# Patient Record
Sex: Male | Born: 1950
Health system: Southern US, Community
[De-identification: ages and names within clinical notes are randomized; demographics above are authoritative.]

## PROBLEM LIST (undated history)

## (undated) DIAGNOSIS — R0600 Dyspnea, unspecified: Secondary | ICD-10-CM

## (undated) DIAGNOSIS — J189 Pneumonia, unspecified organism: Secondary | ICD-10-CM

## (undated) DIAGNOSIS — E785 Hyperlipidemia, unspecified: Secondary | ICD-10-CM

## (undated) DIAGNOSIS — K5792 Diverticulitis of intestine, part unspecified, without perforation or abscess without bleeding: Secondary | ICD-10-CM

## (undated) DIAGNOSIS — M797 Fibromyalgia: Secondary | ICD-10-CM

## (undated) DIAGNOSIS — M1990 Unspecified osteoarthritis, unspecified site: Secondary | ICD-10-CM

## (undated) DIAGNOSIS — F329 Major depressive disorder, single episode, unspecified: Secondary | ICD-10-CM

## (undated) DIAGNOSIS — E119 Type 2 diabetes mellitus without complications: Secondary | ICD-10-CM

## (undated) DIAGNOSIS — G4730 Sleep apnea, unspecified: Secondary | ICD-10-CM

## (undated) DIAGNOSIS — K52832 Lymphocytic colitis: Secondary | ICD-10-CM

## (undated) DIAGNOSIS — F32A Depression, unspecified: Secondary | ICD-10-CM

## (undated) DIAGNOSIS — T7840XA Allergy, unspecified, initial encounter: Secondary | ICD-10-CM

## (undated) DIAGNOSIS — K219 Gastro-esophageal reflux disease without esophagitis: Secondary | ICD-10-CM

## (undated) DIAGNOSIS — R1084 Generalized abdominal pain: Secondary | ICD-10-CM

## (undated) DIAGNOSIS — I10 Essential (primary) hypertension: Secondary | ICD-10-CM

## (undated) DIAGNOSIS — J449 Chronic obstructive pulmonary disease, unspecified: Secondary | ICD-10-CM

## (undated) DIAGNOSIS — J45909 Unspecified asthma, uncomplicated: Secondary | ICD-10-CM

## (undated) DIAGNOSIS — C801 Malignant (primary) neoplasm, unspecified: Secondary | ICD-10-CM

## (undated) HISTORY — PX: ROTATOR CUFF REPAIR: SHX139

## (undated) HISTORY — PX: COLONOSCOPY W/ POLYPECTOMY: SHX1380

## (undated) HISTORY — DX: Allergy, unspecified, initial encounter: T78.40XA

## (undated) HISTORY — DX: Depression, unspecified: F32.A

## (undated) HISTORY — DX: Major depressive disorder, single episode, unspecified: F32.9

## (undated) HISTORY — DX: Generalized abdominal pain: R10.84

## (undated) HISTORY — DX: Essential (primary) hypertension: I10

## (undated) HISTORY — PX: OTHER SURGICAL HISTORY: SHX169

## (undated) HISTORY — DX: Lymphocytic colitis: K52.832

---

## 1998-09-16 ENCOUNTER — Ambulatory Visit (HOSPITAL_BASED_OUTPATIENT_CLINIC_OR_DEPARTMENT_OTHER): Admission: RE | Admit: 1998-09-16 | Discharge: 1998-09-16 | Payer: Self-pay | Admitting: Orthopaedic Surgery

## 2005-10-13 ENCOUNTER — Ambulatory Visit (HOSPITAL_COMMUNITY): Payer: Self-pay | Admitting: Psychiatry

## 2005-11-12 ENCOUNTER — Ambulatory Visit (HOSPITAL_COMMUNITY): Payer: Self-pay | Admitting: Psychiatry

## 2005-12-22 ENCOUNTER — Ambulatory Visit (HOSPITAL_COMMUNITY): Payer: Self-pay | Admitting: Psychiatry

## 2006-03-02 ENCOUNTER — Ambulatory Visit (HOSPITAL_COMMUNITY): Payer: Self-pay | Admitting: Psychiatry

## 2006-03-23 ENCOUNTER — Ambulatory Visit: Payer: Self-pay | Admitting: Internal Medicine

## 2006-05-19 ENCOUNTER — Ambulatory Visit: Payer: Self-pay | Admitting: Internal Medicine

## 2006-05-20 ENCOUNTER — Ambulatory Visit: Payer: Self-pay | Admitting: Internal Medicine

## 2006-05-30 ENCOUNTER — Ambulatory Visit: Payer: Self-pay | Admitting: Internal Medicine

## 2006-05-31 ENCOUNTER — Ambulatory Visit (HOSPITAL_COMMUNITY): Payer: Self-pay | Admitting: Psychiatry

## 2006-06-13 ENCOUNTER — Ambulatory Visit: Payer: Self-pay | Admitting: Internal Medicine

## 2006-06-30 ENCOUNTER — Ambulatory Visit (HOSPITAL_BASED_OUTPATIENT_CLINIC_OR_DEPARTMENT_OTHER): Admission: RE | Admit: 2006-06-30 | Discharge: 2006-06-30 | Payer: Self-pay | Admitting: Internal Medicine

## 2006-06-30 ENCOUNTER — Encounter: Payer: Self-pay | Admitting: Internal Medicine

## 2006-07-14 ENCOUNTER — Ambulatory Visit: Payer: Self-pay | Admitting: Pulmonary Disease

## 2006-07-26 ENCOUNTER — Ambulatory Visit: Payer: Self-pay | Admitting: Internal Medicine

## 2006-08-12 ENCOUNTER — Ambulatory Visit: Payer: Self-pay | Admitting: Pulmonary Disease

## 2006-08-31 ENCOUNTER — Ambulatory Visit (HOSPITAL_COMMUNITY): Payer: Self-pay | Admitting: Psychiatry

## 2006-09-13 ENCOUNTER — Ambulatory Visit: Payer: Self-pay | Admitting: Pulmonary Disease

## 2006-12-28 ENCOUNTER — Ambulatory Visit (HOSPITAL_COMMUNITY): Payer: Self-pay | Admitting: Psychiatry

## 2007-01-24 ENCOUNTER — Ambulatory Visit (HOSPITAL_COMMUNITY): Payer: Self-pay | Admitting: Psychiatry

## 2007-03-10 DIAGNOSIS — G4733 Obstructive sleep apnea (adult) (pediatric): Secondary | ICD-10-CM

## 2007-03-10 DIAGNOSIS — J42 Unspecified chronic bronchitis: Secondary | ICD-10-CM

## 2007-03-10 DIAGNOSIS — F17200 Nicotine dependence, unspecified, uncomplicated: Secondary | ICD-10-CM

## 2007-03-10 DIAGNOSIS — J45909 Unspecified asthma, uncomplicated: Secondary | ICD-10-CM | POA: Insufficient documentation

## 2007-03-10 HISTORY — DX: Unspecified chronic bronchitis: J42

## 2007-03-10 HISTORY — DX: Obstructive sleep apnea (adult) (pediatric): G47.33

## 2007-03-10 HISTORY — DX: Nicotine dependence, unspecified, uncomplicated: F17.200

## 2007-03-14 ENCOUNTER — Ambulatory Visit (HOSPITAL_COMMUNITY): Payer: Self-pay | Admitting: Psychiatry

## 2007-06-06 ENCOUNTER — Ambulatory Visit (HOSPITAL_COMMUNITY): Payer: Self-pay | Admitting: Psychiatry

## 2007-08-08 ENCOUNTER — Ambulatory Visit (HOSPITAL_COMMUNITY): Payer: Self-pay | Admitting: Psychiatry

## 2007-10-11 ENCOUNTER — Ambulatory Visit (HOSPITAL_COMMUNITY): Payer: Self-pay | Admitting: Psychiatry

## 2007-12-27 ENCOUNTER — Ambulatory Visit (HOSPITAL_COMMUNITY): Payer: Self-pay | Admitting: Psychiatry

## 2008-09-06 ENCOUNTER — Telehealth: Payer: Self-pay | Admitting: Pulmonary Disease

## 2009-05-06 ENCOUNTER — Ambulatory Visit (HOSPITAL_COMMUNITY): Payer: Self-pay | Admitting: Psychiatry

## 2009-06-24 ENCOUNTER — Ambulatory Visit (HOSPITAL_COMMUNITY): Payer: Self-pay | Admitting: Psychiatry

## 2009-08-26 ENCOUNTER — Ambulatory Visit (HOSPITAL_COMMUNITY): Payer: Self-pay | Admitting: Psychiatry

## 2009-12-02 ENCOUNTER — Ambulatory Visit (HOSPITAL_COMMUNITY): Payer: Self-pay | Admitting: Psychiatry

## 2010-02-06 ENCOUNTER — Ambulatory Visit (HOSPITAL_COMMUNITY): Payer: Self-pay | Admitting: Psychiatry

## 2010-04-03 ENCOUNTER — Ambulatory Visit (HOSPITAL_COMMUNITY)
Admission: RE | Admit: 2010-04-03 | Discharge: 2010-04-03 | Payer: Self-pay | Source: Home / Self Care | Attending: Psychiatry | Admitting: Psychiatry

## 2010-07-06 ENCOUNTER — Encounter (INDEPENDENT_AMBULATORY_CARE_PROVIDER_SITE_OTHER): Payer: 59 | Admitting: Psychiatry

## 2010-07-06 DIAGNOSIS — F339 Major depressive disorder, recurrent, unspecified: Secondary | ICD-10-CM

## 2010-08-04 NOTE — Assessment & Plan Note (Signed)
[facility]                             PULMONARY OFFICE NOTE   DAREAN, ROTE                  MRN:          [id number]  DATE:09/13/2006                            DOB:          06/01/1950    I saw Mr. Balkcom in followup today for his mild sleep apnea,  moderate asthma with chronic bronchitis and tobacco abuse.   With regards to his sleep apnea, he has been using his auto CPAP for the  last approximately 4 weeks.  He says that since being started on CPAP  this is the first time he can remember where he did not actually have to  wake up several times a night to use the bathroom.  He is no longer  snoring at night and he feels like he is getting better rest at night.  He also says he feels like he has more energy during the day,  particularly in the morning.  He is currently using a full-face mask  with heated humidification.  He does have some nasal congestion but this  was present prior to his use of CPAP and does not appear to have gotten  any worse.  Otherwise, he is not having any difficulty with the use of  his CPAP.   With his asthma and chronic bronchitis he feels that his breathing is  reasonably stable with the use of his Symbicort and Spiriva.  He says  that he does not have much difficulties with regards to dyspnea on  exertion.  He does have occasional cough with a production of clear  sputum.  He does also occasionally get some wheezing.   He is continuing to smoke about a pack of cigarettes per day.  He says  that he has had quite a bit of activity going on as he is building a new  house and as a result it has been difficult for him to focus on smoking  cessation.  He plans on completing his house construction by December of  this year and has stated that he would try to make a New Years  resolution to stop smoking.  His medication list was reviewed.   PHYSICAL EXAM:  He is 225 pounds, temperature is 98.5, blood  pressure is  112/72, heart rate is 89, oxygen saturation is 94% on room air.  HEENT:  There is no sinus tenderness, no nasal discharge, no oral  lesions, no lymphadenopathy.  HEART:  S1, S2, regular rhythm.  CHEST:  He has fine bilateral wheezing which partially cleared after  coughing.  ABDOMEN:  Soft, nontender.  EXTREMITIES:  No edema.   IMPRESSION:  1. Mild obstructive sleep apnea.  He is currently doing quite well on      his auto CPAP.  He is due to drop off his CPAP download to his home      care company and I will review this.  Otherwise, I have encouraged      him to maintain his compliance with his CPAP machine.  2. Moderate asthma with chronic bronchitis.  I will continue him on  his current inhaler regimen of Symbicort 160/4.5  two puffs twice      daily as well as Spiriva one puff daily in addition to using ProAir      HFA two puffs four times daily as needed.  3. Tobacco abuse.  I have again encouraged him to continue through      with his plans for smoking cessation.   I will follow up with him in approximately 4 months.     Coralyn Helling, MD  Electronically Signed    VS/MedQ  DD: 09/13/2006  DT: 09/13/2006  Job #: [id number]   cc:   Dalbert Mayotte, M.D.

## 2010-08-04 NOTE — Assessment & Plan Note (Signed)
[facility]                             PULMONARY OFFICE NOTE   Frederick Guzman, Frederick Guzman                  MRN:          [id number]  DATE:08/12/2006                            DOB:          10-Apr-1950    I met Frederick Guzman with his wife today for evaluation of his sleep  apnea.  He had undergone an overnight polysomnogram, June 30, 2006, and  this showed an overall apnea hypopnea index of 9 with oxygen saturation  nadir of 81%.  He did have a significant REM effect with a REM apnea  hypopnea index of 47.  Also of note is that he did not have much supine  sleep.   He says that his current sleep pattern is that he goes to bed at around  11 o'clock at night.  He has no trouble falling asleep.  He wakes up  once or twice during the night to use the bathroom, but then is able to  fall back asleep after that.  He gets up at around 7 o'clock in the  morning.  He says that he feels reasonably okay when he wakes up, but  apparently has quite a bit of difficulty staying awake during the rest  of the day.  His wife says that he can fall asleep at any time of the  day without problems.  He, however, denies any problems of feeling  sleepy while driving.  He does snore while he is asleep and tends to  breathe through his mouth at night.  He has also been told by his  dentist that he has bruxism.  He will occasionally get vivid dreams and  does talk in his sleep, but does not sleepwalk.  There is no history of  sleep hallucinations, sleep paralysis or cataplexy.  There is no history  of restless leg syndrome.  He is not currently using anything to help  him fall asleep at night or stay awake during the day.  His Epworth  score is 6.   He says that, with regards to his asthma, he still has symptoms of  coughing, wheezing and chest tightness.  He says that he has not noticed  much of a difference since having the dose of his Nexium increased or  with the  addition of the Reglan.  Additionally, he says he has not used  his Ultram for the last several months because he did not really notice  much benefit from it.   He does continue to smoke about one and a half packs of cigarettes per  day.  He apparently had his dose of bupropion increased with the hope  that this would assist with his smoking cessation.   PAST MEDICAL HISTORY:  Significant for asthma, reflux disease, carpal  tunnel syndrome, BPH, elevated cholesterol, and bipolar disease.   CURRENT MEDICATIONS:  1. Flomax 0.4 mg daily.  2. Nexium 40 mg b.i.d.  3. Lipitor 10 mg q.h.s.  4. Bupropion XL 300 mg daily.  5. Depakote ER 1000 mg q.h.s.  6. Symbicort 160/4.5 two puffs b.i.d.  7. AndroGel 5 g b.i.d.  8.  Metoclopramide 10 mg b.i.d.  9. Mucinex as needed.  10.Tramadol as needed.  11.Albuterol as needed.  12.Saline nasal spray as needed.  13.Zyrtec as needed.   ALLERGIES:  He has no known drug allergies.   FAMILY HISTORY:  Significant for his father, who had diabetes, coronary  artery disease and colon cancer.  His mother had diabetes.   SOCIAL HISTORY:  He is married.  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  He continues to smoke one and a half packs of cigarettes per  day.  He smokes as much as two to three packs of cigarettes per day.  There is no significant alcohol use.   REVIEW OF SYSTEMS:  Unremarkable.   PHYSICAL EXAM:  He is 222 pounds, 6 feet tall.  Temperature is 97.7,  blood pressure is 126/80, heart rate is 92, oxygen saturation is 95% on  room air.  HEENT:  He has boggy nasal mucosa with a clear nasal discharge.  He has  a Mallampati 4 airway with enlarged uvula and an enlarged tongue.  He  also has erosion over his front teeth and poor oral dentition.  There is  no lymphadenopathy, no thyromegaly.  HEART:  S1, S2, regular rhythm.  CHEST:  He has bilateral expiratory wheezes.  ABDOMEN:  Soft, nontender, positive bowel sounds.  EXTREMITIES:   There was no edema, cyanosis or clubbing.  NEUROLOGIC EXAM:  No focal deficits were appreciated.   IMPRESSION:  1. Mild obstructive sleep apnea as demonstrated by an apnea/hypopnea      index of 9, and oxygen saturation nadir of 81%.  Again, he did have      a significant REM effect and he had very minimal amount of supine      sleep.  Therefore, the severity of  his sleep apnea may actually be      worse than this.  I reviewed the results of his sleep study with      him.  I discussed the adverse consequences of untreated sleep      apnea, including the risks of hypertension, coronary disease,      cerebrovascular disease, diabetes and arrhythmias.  I discussed      with him the importance of diet, exercise and weight reduction.  It      is interesting that his wife noted that, when he lost 20 pounds      recently, his sleep pattern was much improved, but then he      subsequently regained the weight.  I had also discussed with him      the importance of driving precautions until his sleep apnea is      under adequate control.  I reviewed various treatment options for      his sleep apnea, including CPAP therapy, oral appliance and      surgical intervention.  With some reluctance, he was agreeable to      try CPAP.  I will make arrangements for him to undergo an auto-CPAP      titration study for three weeks and I have also discussed with him      techniques to try to acclimate to the use of CPAP therapy.  2. Moderate asthma with chronic bronchitis.  I would continue him on      Symbicort at his current dose.  I will add Spiriva one puff daily      to see if he gains symptomatic benefit from this.  Additionally, he  did not seem to have much symptomatic benefit from the use of the      Reglan or the increased dose of Nexium.  Therefore, I will have him      decrease his dose of Nexium to 40 mg daily and discontinue his     Reglan to see if he does notice any symptomatic  worsening.  3. Tobacco abuse.  I had again emphasized the importance to him of      smoking cessation.  He is interested in trying to quit and      hopefully, with the increased dose of his Wellbutrin, he will be      able to achieve smoking cessation.   I will follow up with him in approximately three to four weeks.     Coralyn Helling, MD  Electronically Signed    VS/MedQ  DD: 08/12/2006  DT: 08/12/2006  Job #: [id number]   cc:   Dalbert Mayotte, M.D.

## 2010-08-07 NOTE — Assessment & Plan Note (Signed)
[facility]                             PULMONARY OFFICE NOTE   Frederick Guzman, Frederick Guzman                  MRN:          [id number]  DATE:03/23/2006                            DOB:          28-Mar-1950    CHIEF COMPLAINT:  Dyspnea.   HISTORY:  A 60 year old white male with worsening dyspnea associated  with chest and sinus congestion since June of 2007 when he caught a  cold.  He is short of breath with anything more than slow ADLs, but  also having trouble with nocturnal wheeze that keeps his wife up.  He  denies any excessive daytime hypersomnolence, fever, chills, sweats,  purulent sputum, orthopnea, PND, or leg swelling, chest pain of any  kind, and does describe some improvement from Advair.   PAST MEDICAL HISTORY:  Significant for hypertension and BPH.   ALLERGIES:  None known.   MEDICATIONS:  Taken in detail on the worksheet, see column dated March 23, 2006.  Note that the patient is supposed to be taking Advair twice  daily, but actually taking the 250/50 dose 2 b.i.d.   SOCIAL HISTORY:  He continues to smoke 2 packs per day and is not quite  ready to quit.  He works as an Nutritional therapist.  He denies any  unusual travel, pet, or hobby exposures.   FAMILY HISTORY:  Reviewed in detail on the worksheet and significant for  the absence of atopy or asthma.   REVIEW OF SYSTEMS:  Also taken in detail on the worksheet.  Significant  for the absence of additional complaints.   PHYSICAL EXAMINATION:  This is a pleasant, ambulatory, moderately obese  white male in no acute distress.  VITAL SIGNS:  Stable vital signs.  HEENT:  Moderate turbinate edema.  Oropharynx is clear.  There is no  evidence of excessive post-nasal drainage or cobblestoning.  Dentition  is intact.  NECK:  Supple without cervical adenopathy or tenderness. Trachea is  midline.  No thyromegaly.  LUNGS:  Fields reveal inspiratory and expiratory rhonchi with both upper  and lower airway features.  CARDIAC:  Regular rate and rhythm without murmur, gallop, or rub.  ABDOMEN:  Obese, but otherwise benign. Positive Hoover sign at the end  of inspiration.  Otherwise, the abdomen is without palpable organomegaly  or tenderness.  EXTREMITIES:  Warm without calf tenderness, cyanosis, clubbing, or  edema.   CHEST X-RAY:  Pending today.   Heme saturation is 95% on room air.   IMPRESSION:  1. Chronic obstructive pulmonary disease with definite asthmatic      bronchitic component, but I am also concerned about upper airway      stability based on his exam today, and wondered whether or not this      is due to poorly-controlled reflux, a mechanical problem such as      tumor, or the topical effects of steroids.   To try to sort this out, I first recommended teaching him optimal MDI  technique with an HFA product, namely Symbicort 164.5 two puffs b.i.d.  to see to what extent the upper airway problem improves on Symbicort  (  there is always the risk, of course, that the lower airway will worsen  on the Symbicort, and I have asked him to switch back to Advair if this  is the case).  1. Add metoclopramide 10 mg at bedtime since he is having so much      nocturnal heartburn, wheezing, and congestion, which may be      partly related to  laryngopharyngeal reflux .  2. Continue to take Nexium 40 mg tablets before his first meal of the      day.  For coughing and rattling, I would like him to use Mucinex 2      every 12 hours and use albuterol on a p.r.n. basis only.   The obvious most critical aspect of his care is that he commit to quit  smoking now before he develops irreversible problems such as structural  lung disease or malignancy, and I did discuss the natural history of  COPD in the context of continued smoking, but did not get a full  commitment from him to set a quit date (apparently, this has already  been addressed and he has yet to start Chantix, as he  is yet to set a  quit date), but I did strongly endorse using it if he will set a quit  date.  Followup will be in 4 to 6 weeks with a set of lung functions,  one option might be to switch over to Combivent as a rescue therapy if  this combination is not effective.     Charlaine Dalton. Sherene Sires, MD, [facility]  Electronically Signed    MBW/MedQ  DD: 03/23/2006  DT: 03/23/2006  Job #: [id number]

## 2010-08-07 NOTE — Assessment & Plan Note (Signed)
[facility]                             PULMONARY OFFICE NOTE   JERREN, FLINCHBAUGH                  MRN:          [id number]  DATE:06/13/2006                            DOB:          Sep 07, 1950    HISTORY OF PRESENT ILLNESS:  This a 60 year old white male patient of  Dr. Thurston Hole who has a known history of chronic asthma who has difficulty  with recurrent flares. Patient returns today for a 2-week follow-up. The  patient is substantially improved with decreased cough, shortness of  breath and wheezing. The patient has been started on Symbicort 160/4.5  two puffs twice daily along with aggressive reflux prevention including  Nexium twice daily and Reglan x4 daily. The patient returns today  reporting that he is much improved.  Patient does continue to have  chronic snoring to the point where it is disrupting his and his wife's  sleep, and has occasional daytime hypersomnolence. The patient and wife  deny any known excessive limb movement or discomfort. Patient denies any  chest pain, shortness of breath, orthopnea, PND, or leg swelling.   PAST MEDICAL HISTORY:  Reviewed.   CURRENT MEDICATIONS:  Reviewed.   PHYSICAL EXAMINATION:  GENERAL: The patient is a pleasant male in no  acute distress.  VITAL SIGNS: He is afebrile with stable vital signs. Saturation is 97%  on room air.  HEENT: Reveals a thick tongue and somewhat elongated uvula.  NECK: Supple without cervical adenopathy. No JVD.  LUNGS: Clear breath sounds bilaterally.  CARDIAC: Regular rate.  ABDOMEN: Soft and nontender and obese.  EXTREMITIES: Warm without any calf tenderness, cyanosis, clubbing or  edema.   IMPRESSION AND PLAN:  1. Chronic asthma with recurrent flares suspected to secondary to      reflux. Patient is much improved. Will continue on Symbicort      160/4.5 two puffs b.i.d.  Patient may decrease Reglan down to 10 mg      b.i.d. Continue Nexium b.i.d.  He will return  back to the office      for follow-up in 4-6 weeks or sooner if needed.  2. Complex medication regimen. Patient's medication list was reviewed      in detail. Patient education was provided. Patient was provided      with a computerized medication list and is aware to bring this back      on each visit.  3. Inadequate sleep hygiene and chronic snoring. Patient may have some      underlying sleep      apnea. Will set patient up for a sleep study an will follow-up when      results are reviewed. Patient may need referral to one of our sleep      specialists.      Rubye Oaks, NP  Electronically Signed      Charlaine Dalton. Sherene Sires, MD, [facility]  Electronically Signed   TP/MedQ  DD: 06/13/2006  DT: 06/13/2006  Job #: [id number]

## 2010-08-07 NOTE — Assessment & Plan Note (Signed)
[facility]                             PULMONARY OFFICE NOTE   MARSALIS, BEAULIEU                  MRN:          [id number]  DATE:05/20/2006                            DOB:          [birth year]    HISTORY OF PRESENT ILLNESS:  This patient is a 61 year old white male  patient of Dr. Thurston Hole, who has a history of COPD with an asthmatic  bronchitic component.  Patient presents for a one day followup.  Patient  was seen in the office yesterday for an acute exacerbation of asthmatic  bronchitis with some mild hypoxia.  Patient was seen by his primary care  physician two days ago and started on Levaquin and prednisone.  Patient  had no improvement in symptoms.  Yesterday, he was restarted on  Symbicort and recommended to increase Reglan up to twice a day.  Patient  returns to the office today, reported that he is much improved with  decreased cough, shortness of breath and increased exercise tolerance.  Patient denies any hemoptysis, chest pain, orthopnea, PND or leg  swelling.   PHYSICAL EXAMINATION:  GENERAL:  Patient is a pleasant male in no acute  distress.  He is afebrile.  VITAL SIGNS:  Stable vitals.  O2 saturation is 93% on room air.  HEENT:  Unremarkable.  NECK:  Supple without adenopathy.  LUNGS:  Sounds reveal some scattered rhonchi bilaterally with a few  expiratory wheezes.  This is much improved from yesterday.  CARDIAC:  Regular rate and rhythm.  ABDOMEN:  Soft and nontender.  EXTREMITIES:  Warm without any edema.   IMPRESSION/PLAN:  Acute exacerbation of asthmatic bronchitis.  Patient  is much improved since yesterday.  Patient will finish his antibiotics  as recommended.  Taper prednisone as scheduled.  Continue on Mucinex DM  twice daily for cough, along with Tramadol for breakthrough coughing.  Patient will continue on aggressive reflux prevention, along with Reglan  and Nexium.  Patient will return back with Dr. Sherene Sires next week  as  scheduled or sooner if needed.      Rubye Oaks, NP  Electronically Signed      Charlaine Dalton. Sherene Sires, MD, [facility]  Electronically Signed   TP/MedQ  DD: 05/23/2006  DT: 05/23/2006  Job #: [id number]

## 2010-08-07 NOTE — Procedures (Signed)
NAMETERRACE, FONTANILLA NO.:  [id number]   MEDICAL RECORD NO.:  [id number]          PATIENT TYPE:  OUT   LOCATION:  SLEEP CENTER                 FACILITY[facility]  [facility]   PHYSICIAN:  Barbaraann Share, MD,FCCPDATE OF BIRTH:  24-Apr-1949   DATE OF STUDY:  06/30/2006                            NOCTURNAL POLYSOMNOGRAM   REFERRING PHYSICIAN:  Charlaine Dalton. Wert, MD, FCCP   INDICATION FOR STUDY:  Hypersomnia with sleep apnea.   EPWORTH SLEEPINESS SCORE:  6   SLEEP ARCHITECTURE:  The patient had a total sleep time of 336 minutes  with only 56 minutes of REM and never achieved slow wave sleep.  Sleep  onset latency was normal as was REM onset.  Sleep efficiency was  decreased at 79%.   RESPIRATORY DATA:  The patient was found to have 40 obstructive  hypopneas and seven obstructive apneas for an apnea/hypopnea index of  nine events per hour.  The events were not positional and there was loud  snoring noted throughout.  The patient did not meet split-night criteria  secondary to the small numbers of events during the first half of the  night.   OXYGEN DATA:  The patient had O2 desaturation transiently to 81% with  his obstructive events.   CARDIAC DATA:  Rare PVCs were noted but no clinically significant  arrhythmias.   MOVEMENT/PARASOMNIA:  The patient was found to have 87 leg jerks with  four per hour resulting in arousal or awakening.   IMPRESSION/RECOMMENDATION:  1. Mild obstructive sleep apnea/hypopnea syndrome with an      apnea/hypopnea index of nine events per hour and oxygen      desaturation as low as 81%.  The patient did not meet split-night      criteria secondary to the small numbers of events during the first      half of the night.  It should be noted the majority of the      patient's events occurred during REM and therefore this may have      more significant impact at a lower apnea/hypopnea index.  2. Rare premature ventricular contractions.  3. Moderate  numbers of leg jerks with what appears to be significant      sleep disruption.  It is unclear how much of      this is related to the patient's sleep-disordered breathing or      whether he may have a concomitant primary movement disorder of      sleep.  Clinical correlation is suggested.      Barbaraann Share, MD,FCCP  Diplomate, American Board of Sleep  Medicine  Electronically Signed     [facility]/MEDQ  D:  07/13/2006 14:35:42  T:  07/13/2006 16:03:45  Job:  [id number]

## 2010-08-07 NOTE — Assessment & Plan Note (Signed)
[facility]                             PULMONARY OFFICE NOTE   DESJUAN, STEARNS                  MRN:          [id number]  DATE:05/30/2006                            DOB:          [birth year]    PULMONARY/EXTENDED OFFICE VISIT:   HISTORY:  A 60 year old white male active smoker in for return  evaluation of dyspnea and cough.  He says everything is completely back  to normal for him except for minimum cough in the mornings with thick,  white mucus production.  He said this typically has been no worse in  retrospect in winter versus summer, versus cold or warm weather, and  only spends a few  minutes coughing it up and then he is ready to go.  He attributes this improvement to receiving a nebulizer (presumably just  albuterol) since his previous visit to see me.   The patient is on a very complex medical regimen which includes:  1. Metoclopramide dose b.i.d., but not before meals.  2. Also note that he is on Nexium but does not take it consistently,      before eating, as also previously recommended.   EXAMINATION:  GENERAL:  He is a stoic ambulatory white man in no acute  distress.  VITALS: He has stable vital signs.  HEENT:  Reveals minimal turbinates, oropharynx is clear.  NECK:  Supple without cervical lymphadenopathy or tenderness.  Trachea  is midline.  LUNG FIELDS: Perfectly clear bilaterally by auscultation and percussion.  HEART: There is a regular rate and rhythm without murmur, gallop or rub.  ABDOMEN:  Soft, benign.  EXTREMITIES:  No calf tenderness, cyanosis or clubbing.   PFTs to my surprise reveal an FEV1 of 93% with no improvement after  bronchodilators and a normal diffusing capacity and lung volumes.   IMPRESSION:  This patient does not actually have chronic obstructive  pulmonary disease at all but rather chronic asthma that is at present  being vigorously treated with combination products including treatment  directed airways, inflammation, bronchospasm and potential underlying  reflux.  The issue is whether reflux is a primary problem driving the  airways instability or whether it is secondary to the cough.  To sort  through all of this I recommended the following:  1. Maximize treatment directed at reflux by reemphasizing diet,      smoking abstinence, and b.i.d. dosing with proton pump inhibitor      and q.i.d. dosing with metoclopramide.  2. Continue Symbicort 160 4.5 two puffs b.i.d.  3. I emphasized to him that the albuterol whether it is an MDI or      nebulizer form is only to be used p.r.n.  and that his dependency      on albuterol will help [place] in regards to understanding whether or      not we have control of this underlying disease, which is airways      inflammation.  Therefore it does not matter how he uses albuterol      but should only use it every 4 hours if      needed and not on  a regular basis.  Similarly for cough he should      use Mucinex DM 1 to 2      b.i.d. p.r.n.  and not use it regularly, perhaps the less he coughs      and needs this p.r.n. the better control his underlying problem is      likely to be.     Charlaine Dalton. Sherene Sires, MD, [facility]  Electronically Signed    MBW/MedQ  DD: 05/30/2006  DT: 05/30/2006  Job #: [id number]

## 2010-08-07 NOTE — Assessment & Plan Note (Signed)
[facility]                             PULMONARY OFFICE NOTE   WOODLEY, PETZOLD                  MRN:          [id number]  DATE:05/19/2006                            DOB:          Oct 02, 1950    HISTORY OF PRESENT ILLNESS:  This is a 60 year old white male patient of  Dr. Thurston Hole who was seen 61 months ago for a pulmonary consult for  worsening shortness of breath.  The patient has a history of COPD with  an asthmatic bronchitic component and has complained over the last year  he has had symptoms of cough, congestion, wheezing, and shortness of  breath about every couple of months and has been treated with antibiotic  by his primary care physician.  The patient was seen in the office by  Dr. Sherene Sires 2 months ago for worsening shortness of breath.  At that time  was changed over from Advair to Symbicort 160/4.5 two puffs twice daily  and started on metoclopramide at bedtime for suspected reflux that could  be causing upper airway instability.  He was also given Mucinex DM to be  used for cough and congestion.  The patient also does continue smoking,  had been recommended to start on Chantix.  However, the patient is not  100% on-board that he is ready to quit and therefore has not filled  Chantix.  The patient reports that he was improved after his last visit.  However, over the last 2 weeks symptoms have returned once again with  increased cough, congestion, shortness of breath, and wheezing.  He was  seen by his primary care physician yesterday and started on Levaquin 500  mg for 7 days.  The patient did receive a chest x-ray (not available for  review at present) which he reports did not have any pneumonia per his  primary care physician.  The patient over the last 2 weeks has run out  of Symbicort and had been scheduled for a followup visit with pulmonary  function tests.  However, his appointment got rescheduled by our office  due to inclement  weather.  The patient reports his symptoms of cough and  congestion last visit did get better; however, his shortness of breath  really has not improved with the Symbicort and he continues to have some  intermittent wheezing at night as well.  The patient also yesterday was  started on prednisone taper and is currently on 60 mg.  The patient  denies any hemoptysis, orthopnea, PND, or leg swelling.  The patient  does complain that he is extremely short of breath, even with minimum  activity.   PAST MEDICAL HISTORY:  Reviewed.   CURRENT MEDICATIONS:  Reviewed.   PHYSICAL EXAMINATION:  GENERAL:  The patient is an obese male in no  acute distress.  VITAL SIGNS:  He is afebrile with stable vital signs.  O2 saturation was  88% on room air.  After resting, increased up to 90%.  HEENT:  Nasal mucosa slightly swollen.  Posterior pharynx is clear.  NECK:  Supple without adenopathy.  No JVD.  LUNG SOUNDS:  Reveal coarse  breath sounds bilaterally with expiratory  wheezes.  CARDIAC:  A regular rate and rhythm.  ABDOMEN:  Soft, nontender, no hepatosplenomegaly is appreciated.  EXTREMITIES:  Warm without any calf tenderness, cyanosis, clubbing, or  edema.   IMPRESSION AND PLAN:  An acute exacerbation of asthmatic bronchitis with  some mild hypoxia.  The patient has been recommended for  hospitalization; however, he wants to wait and see if medications will  start to work.  Have advised him that we will hold off for one more day.  He is to restart Symbicort two puffs twice daily.  Continue on his  prednisone taper. Finish Levaquin. The patient did receive a Depo-Medrol  injection yesterday at his primary care physician's office.  We will  also add in Mucinex DM twice daily and add in tramadol for any  breakthrough coughing.  The patient was given an albuterol nebulizer  yesterday and is to use this on an as-needed basis.  The patient is to  refrain from any type of work.  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  The patient will  return back to our office tomorrow for followup.  If symptoms are not  any better, will require hospitalization.  The patient and wife are in  agreement with the above plan.      Rubye Oaks, NP  Electronically Signed      Charlaine Dalton. Sherene Sires, MD, [facility]  Electronically Signed   TP/MedQ  DD: 05/20/2006  DT: 05/20/2006  Job #: [id number]

## 2010-08-07 NOTE — Assessment & Plan Note (Signed)
[facility]                             PULMONARY OFFICE NOTE   Frederick Guzman, Frederick Guzman                  MRN:          [id number]  DATE:07/26/2006                            DOB:          1950-03-28    HISTORY OF PRESENT ILLNESS:  The patient is a 60 year old white male  patient of Dr. Sherene Sires with a known history of chronic asthma who continues  to smoke against medical advice, who presents today for follow-up.  The  patient reports has asthma has been under good control with decreased  cough, wheezing and decreased use of his rescue inhaler.  He has noted  over the last 2 days that he has had some nasal congestion, postnasal  drip symptoms.  The patient also underwent a sleep study on June 30, 2006, and is here to discuss the results of this.  The patient and his  wife have been complaining of significant snoring up to the point where  it was disrupting his wife's sleep.  He did admit to some occasional  daytime hypersomnolence.  However, he denied any restless leg symptoms.  Sleep study on June 30, 2006, showed mild obstructive sleep apnea with  9 events per hour.  The patient did have some O2 saturations down to  81%.  It was noted that the majority of the patient's events occurred  during REM.  The patient did have a moderate number of leg jerks with  what appears to be significant sleep disturbance.  The patient has  noticed that his snoring and sleep issues have increased over the last  year since he has gained back 30 pounds which he lost approximately 1  year ago.   PAST MEDICAL HISTORY:  Reviewed.   CURRENT MEDICATIONS:  Reviewed.   PHYSICAL EXAMINATION:  GENERAL:  The patient is a pleasant male in no  acute distress.  VITAL SIGNS:  He is afebrile with stable vital signs.  O2 saturation is  96% on room air.  HEENT:  The patient does have a very thick tongue with a somewhat  elongated uvula.  NECK:  Supple without cervical adenopathy.   No JVD.  LUNGS:  Lung sounds are clear to auscultation bilaterally.  CARDIAC:  Regular rate and rhythm.  ABDOMEN:  Soft, obese and nontender.  EXTREMITIES:  Warm without any edema.   IMPRESSION AND PLAN:  1. Chronic asthma in a patient who continues to smoke.  In-depth      conversation today with the patient concerning smoking cessation.      The patient is currently well-controlled on his Symbicort 160/4.5      mg two puffs twice daily.  The patient will follow back up here in      3 months or sooner if needed.  2. Chronic reflux with morbid obesity.  The patient will continue on      Nexium and Reglan twice daily.  Patient advised on weight loss and      reflux preventative measures.  3. Mild obstructive sleep apnea with recent sleep study on June 30, 2006.  Sleep study  results were discussed with the patient.  It was      decided that the patient will be referred over to our sleep      specialist, Dr. Craige Cotta, for further evaluation and treatment options.  4. Allergic rhinitis.  The patient is to use daily nasal spray p.r.n.      and add in Zyrtec 10 mg at bedtime as needed.   The patient will return to the office as scheduled or sooner if needed.      Rubye Oaks, NP       Charlaine Dalton. Sherene Sires, MD, [facility]    TP/MedQ  DD: 07/28/2006  DT: 07/28/2006  Job #: [id number]

## 2010-10-07 DIAGNOSIS — R079 Chest pain, unspecified: Secondary | ICD-10-CM | POA: Insufficient documentation

## 2010-10-07 DIAGNOSIS — J449 Chronic obstructive pulmonary disease, unspecified: Secondary | ICD-10-CM | POA: Insufficient documentation

## 2010-11-23 ENCOUNTER — Inpatient Hospital Stay (INDEPENDENT_AMBULATORY_CARE_PROVIDER_SITE_OTHER)
Admission: RE | Admit: 2010-11-23 | Discharge: 2010-11-23 | Disposition: A | Discharge status: Home / Self Care | Payer: 59 | Source: Ambulatory Visit | Attending: Emergency Medicine | Admitting: Emergency Medicine

## 2010-11-23 ENCOUNTER — Encounter: Payer: Self-pay | Admitting: Emergency Medicine

## 2010-11-23 DIAGNOSIS — K219 Gastro-esophageal reflux disease without esophagitis: Secondary | ICD-10-CM | POA: Insufficient documentation

## 2010-11-23 DIAGNOSIS — L2089 Other atopic dermatitis: Secondary | ICD-10-CM

## 2010-12-04 ENCOUNTER — Telehealth (INDEPENDENT_AMBULATORY_CARE_PROVIDER_SITE_OTHER): Payer: Self-pay | Admitting: *Deleted

## 2011-02-22 NOTE — Progress Notes (Signed)
Summary: rash on leg...wse rm 4   Vital Signs:  Patient Profile:   60 Years Old Male CC:      rash on back of RT ankle x 2 wks Weight:      215.50 pounds O2 Sat:      97 % O2 treatment:    Room Air Temp:     98.4 degrees F oral Pulse rate:   83 / minute Resp:     18 per minute BP sitting:   147 / 71  (left arm) Cuff size:   large  Vitals Entered By: Clemens Catholic LPN (November 23, 2010 12:32 PM)                  Updated Prior Medication List: NEXIUM 40 MG  CPDR (ESOMEPRAZOLE MAGNESIUM) Take 1 tablet by mouth two times a day SYMBICORT 160-4.5 MCG/ACT  AERO (BUDESONIDE-FORMOTEROL FUMARATE) 2 puffs two times a day ALBUTEROL 90 MCG/ACT  AERS (ALBUTEROL) 2 puffs q 4 hrs as needed MAGNESIUM 300 MG CAPS (MAGNESIUM)  ZOLOFT 100 MG TABS (SERTRALINE HCL)  TAMSULOSIN HCL 0.4 MG CAPS (TAMSULOSIN HCL)  GABAPENTIN 100 MG CAPS (GABAPENTIN)  AXIRON 30 MG/ACT SOLN (TESTOSTERONE)   Current Allergies (reviewed today): No known allergies History of Present Illness Chief Complaint: rash on back of RT ankle x 2 wks History of Present Illness: Back of R ankle for 2 weeks has had an progressively enlarging and painful rash/lesion.  He used a heating pad which brought it to a head and it drained yesterday.  No F/C/N/V, HA, myalgias, fatigue.  Hasn't used any meds.  No known tick bites in the last few weeks, but was camping near [place] 2 months ago.  REVIEW OF SYSTEMS Constitutional Symptoms      Denies fever, chills, night sweats, weight loss, weight gain, and fatigue.  Eyes       Denies change in vision, eye pain, eye discharge, glasses, contact lenses, and eye surgery. Ear/Nose/Throat/Mouth       Denies hearing loss/aids, change in hearing, ear pain, ear discharge, dizziness, frequent runny nose, frequent nose bleeds, sinus problems, sore throat, hoarseness, and tooth pain or bleeding.  Respiratory       Denies dry cough, productive cough, wheezing, shortness of breath, asthma,  bronchitis, and emphysema/COPD.  Cardiovascular       Denies murmurs, chest pain, and tires easily with exhertion.    Gastrointestinal       Denies stomach pain, nausea/vomiting, diarrhea, constipation, blood in bowel movements, and indigestion. Genitourniary       Denies painful urination, kidney stones, and loss of urinary control. Neurological       Denies paralysis, seizures, and fainting/blackouts. Musculoskeletal       Denies muscle pain, joint pain, joint stiffness, decreased range of motion, redness, swelling, muscle weakness, and gout.  Skin       Denies bruising, unusual mles/lumps or sores, and hair/skin or nail changes.  Psych       Denies mood changes, temper/anger issues, anxiety/stress, speech problems, depression, and sleep problems. Other Comments: pt c/o rash on the back of his RT ankle x 2 wks, worse for 2-3 days. painful and ankle swelling. he also states that it came to a head and drained last night.    Past History:  Past Medical History: GERD  Past Surgical History: carpal tunnel release  Family History: Family History Diabetes 1st degree relative colon CA, heart dz  Social History: Current Smoker 2 PPD Alcohol use-yes 3  per wk Drug use-no Drug Use:  no Physical Exam General appearance: well developed, well nourished, no acute distress MSE: oriented to time, place, and person R heel/ankle: over the distal Achilles he has a circular raised lesion with what appears to be a small indurated abscess (1cm) with mild TTP and swelling around the area.  Ankle FROM with no pain on movement.  Distal NV status intact. Assessment New Problems: DERMATITIS, ATOPIC (ICD-691.8) FAMILY HISTORY DIABETES 1ST DEGREE RELATIVE (ICD-V18.0) GERD (ICD-530.81)   Plan New Medications/Changes: LOTRISONE 1-0.05 % CREA (CLOTRIMAZOLE-BETAMETHASONE) apply two times a day for 2 weeks  #QS x 0, 11/23/2010, Hoyt Koch MD DOXYCYCLINE HYCLATE 100 MG CAPS (DOXYCYCLINE  HYCLATE) 1 by mouth two times a day for 14 days  #28 x 0, 11/23/2010, Hoyt Koch MD  New Orders: New Patient Level III [phone] Planning Comments:   The rash looks like either ECM from a tick vs ringworm.  I will treat first with Doxy for 2 weeks.  He has a f/u with his PCP in a few weeks for bloodwork, so his wife will call and have them add Lymes titers to those labs.  Also will place on Lotrisone in case there is a fungal element to it.  I educated him & his wife about Lymes and what symptoms to look out for.  If not improving or if any worsening symptoms (particularly HA, confusion, or more infectious signs), should see derm or his PCP sooner.   The patient and/or caregiver has been counseled thoroughly with regard to medications prescribed including dosage, schedule, interactions, rationale for use, and possible side effects and they verbalize understanding.  Diagnoses and expected course of recovery discussed and will return if not improved as expected or if the condition worsens. Patient and/or caregiver verbalized understanding.  Prescriptions: LOTRISONE 1-0.05 % CREA (CLOTRIMAZOLE-BETAMETHASONE) apply two times a day for 2 weeks  #QS x 0   Entered and Authorized by:   Hoyt Koch MD   Signed by:   Hoyt Koch MD on 11/23/2010   Method used:   Handwritten   RxID:   [id number] DOXYCYCLINE HYCLATE 100 MG CAPS (DOXYCYCLINE HYCLATE) 1 by mouth two times a day for 14 days  #28 x 0   Entered and Authorized by:   Hoyt Koch MD   Signed by:   Hoyt Koch MD on 11/23/2010   Method used:   Handwritten   RxID:   [id number]   Orders Added: 1)  New Patient Level III [29528]

## 2011-02-22 NOTE — Telephone Encounter (Signed)
  Phone Note Call from Patient   Caller: pts wife Summary of Call: pts wife called and states that he is getting better but not completely he would like his ABT extended. per dr Orson Aloe ok to call in doxy 100mg   two times a day x 10days. reminded the pt to f/u with his PCP as per dr henderson's note. pt agrees.  Initial call taken by: Clemens Catholic LPN,  December 04, 2010 3:03 PM

## 2011-02-25 ENCOUNTER — Ambulatory Visit (INDEPENDENT_AMBULATORY_CARE_PROVIDER_SITE_OTHER): Payer: 59 | Admitting: Psychiatry

## 2011-02-25 ENCOUNTER — Encounter (HOSPITAL_COMMUNITY): Payer: Self-pay | Admitting: Psychiatry

## 2011-02-25 VITALS — BP 141/110 | Ht 72.0 in | Wt 210.0 lb

## 2011-02-25 DIAGNOSIS — F329 Major depressive disorder, single episode, unspecified: Secondary | ICD-10-CM

## 2011-02-25 NOTE — Progress Notes (Signed)
   [facility] Follow-up Outpatient Visit  Frederick Guzman 02-11-1951   Subjective: The patient is a 60 year old male who has been followed by [facility] since July of 2007. I have been treating him since October of 2008. This is currently diagnosed with major depressive disorder. He has been on and off a couple of different medications. Most recently he has been on only Zoloft 200 mg daily. I have not seen him since April. At today's appointment he told me how he quit his job. 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 There was an accident work and a 60 year old almost lost his hand on the job site. This really effected the patient. Currently he has no money. 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 He has put everything into his girlfriend's name in an effort to get a mortgage. He states that since 2007 he has lost $500,000. There are times that he feels like giving up. He has had some fleeting suicidal thoughts in the past. Feels like his relationship with his girlfriend is loveless and he does not want to be with her anymore. She is currently diagnosed with hairy cell leukemia and is in treatment.  Filed Vitals:   02/25/11 1436  BP: 141/110   Mental Status Examination  Appearance: Casually dressed Alert: Yes Attention: good  Cooperative: Yes Eye Contact: Good Speech: Regular rate rhythm and volume Psychomotor Activity: Normal Memory/Concentration: Intact Oriented: person, place, time/date and situation Mood: Depressed Affect: Full Range Thought Processes and Associations: Logical Fund of Knowledge: Fair Thought Content: No suicidal or homicidal thoughts at present Insight: Fair Judgement: Fair  Diagnosis: Depressive disorder recurrent moderate  Treatment Plan: This point we'll continue the Zoloft at 200 mg daily. Patient is to call as needed. I will see him back in 2 months for a 30 minute appointment.  Jamse Mead, MD

## 2011-04-29 ENCOUNTER — Ambulatory Visit (INDEPENDENT_AMBULATORY_CARE_PROVIDER_SITE_OTHER): Payer: 59 | Admitting: Psychiatry

## 2011-04-29 DIAGNOSIS — F329 Major depressive disorder, single episode, unspecified: Secondary | ICD-10-CM

## 2011-04-30 ENCOUNTER — Encounter (HOSPITAL_COMMUNITY): Payer: Self-pay | Admitting: Psychiatry

## 2011-04-30 DIAGNOSIS — F329 Major depressive disorder, single episode, unspecified: Secondary | ICD-10-CM

## 2011-04-30 HISTORY — DX: Major depressive disorder, single episode, unspecified: F32.9

## 2011-04-30 NOTE — Progress Notes (Signed)
   [facility] Behavioral Health Follow-up Outpatient Visit  BRAIN HONEYCUTT 05/12/1950   Subjective: The patient is a 61 year old male who has been followed by [facility] since July of 2007. I have been treating him since October of 2008. This is currently diagnosed with major depressive disorder. He has been on and off a couple of different medications. Most recently he has been on only Zoloft 200 mg daily. He presents today for his followup. The patient still reports ongoing depression. He's had his ups and downs. He still with his girlfriend Programmer, applications. Her white counts have been down. She does go to work every day, but doesn't want to be social besides that. He will go out with his friends, but she doesn't approve of them so she stays home. The patient has had some bad days since his last visit. He still doing work with sheds. He has had no contact with his brother-in-law. He sees his daughter about twice a week. He's also in contact with his son. He still has regret regarding the money that he put into the house.  Filed Vitals:   04/30/11 0833  BP: 120/80   Mental Status Examination  Appearance: Casually dressed Alert: Yes Attention: good  Cooperative: Yes Eye Contact: Good Speech: Regular rate rhythm and volume Psychomotor Activity: Normal Memory/Concentration: Intact Oriented: person, place, time/date and situation Mood: Depressed Affect: Full Range Thought Processes and Associations: Logical Fund of Knowledge: Fair Thought Content: No suicidal or homicidal thoughts at present Insight: Fair Judgement: Fair  Diagnosis: Maj. depressive disorder recurrent moderate  Treatment Plan: This point we'll continue the Zoloft at 200 mg daily. Patient is to call as needed. I will see him back in 3 months. Jamse Mead, MD

## 2011-05-28 DIAGNOSIS — M19039 Primary osteoarthritis, unspecified wrist: Secondary | ICD-10-CM | POA: Insufficient documentation

## 2011-05-28 HISTORY — DX: Primary osteoarthritis, unspecified wrist: M19.039

## 2011-06-17 DIAGNOSIS — N400 Enlarged prostate without lower urinary tract symptoms: Secondary | ICD-10-CM

## 2011-06-17 DIAGNOSIS — E291 Testicular hypofunction: Secondary | ICD-10-CM | POA: Insufficient documentation

## 2011-06-17 HISTORY — DX: Testicular hypofunction: E29.1

## 2011-06-17 HISTORY — DX: Benign prostatic hyperplasia without lower urinary tract symptoms: N40.0

## 2011-07-01 ENCOUNTER — Other Ambulatory Visit (HOSPITAL_COMMUNITY): Payer: Self-pay | Admitting: Psychiatry

## 2011-07-01 MED ORDER — SERTRALINE HCL 100 MG PO TABS: 200.0000 mg | ORAL_TABLET | Freq: Every day | ORAL | Status: DC

## 2011-07-27 ENCOUNTER — Ambulatory Visit (HOSPITAL_COMMUNITY): Payer: Self-pay | Admitting: Psychiatry

## 2012-01-12 ENCOUNTER — Other Ambulatory Visit (HOSPITAL_COMMUNITY): Payer: Self-pay | Admitting: Psychiatry

## 2012-01-12 MED ORDER — SERTRALINE HCL 100 MG PO TABS: ORAL_TABLET | ORAL | Status: DC

## 2012-02-09 ENCOUNTER — Ambulatory Visit (INDEPENDENT_AMBULATORY_CARE_PROVIDER_SITE_OTHER): Payer: 59 | Admitting: Psychiatry

## 2012-02-09 VITALS — BP 160/95 | Ht 72.0 in | Wt 217.0 lb

## 2012-02-09 DIAGNOSIS — F331 Major depressive disorder, recurrent, moderate: Secondary | ICD-10-CM

## 2012-02-09 DIAGNOSIS — F330 Major depressive disorder, recurrent, mild: Secondary | ICD-10-CM

## 2012-02-10 ENCOUNTER — Encounter (HOSPITAL_COMMUNITY): Payer: Self-pay | Admitting: Psychiatry

## 2012-02-10 NOTE — Progress Notes (Signed)
   [facility] Behavioral Health Follow-up Outpatient Visit  Frederick Guzman [birth year]   Subjective: The patient is a 61 year old male who has been followed by [facility] since July of 2007. I have been treating him since October of 2008. He is currently diagnosed with major depressive disorder. I have not seen him since February. He has been compliant with his Zoloft. When we spoke on the phone and I reminded him to come in to see me, patient reported he wanted to come off Zoloft. We'll plan to taper over the phone. He presents today. He is down to once Zoloft a day. He continues to work. He is hired to additional people. The patient feels that his depression was mainly stemming from his low testosterone. That's being treated, he is feeling emotionally more stable. He does not feel the need for Zoloft anymore. His girlfriend continues to work. She has been an experimental trials for her hairy cell leukemia. It is a disorder extremely rare and females, and she has not been responding to treatment. He endorses good sleep and appetite. He still has some anxiety stemming from the house. They continue to pay off the building loan. He would like to discontinue treatment.  Filed Vitals:   02/10/12 0844  BP: 160/95   Mental Status Examination  Appearance: Casually dressed Alert: Yes Attention: good  Cooperative: Yes Eye Contact: Good Speech: Regular rate rhythm and volume Psychomotor Activity: Normal Memory/Concentration: Intact Oriented: person, place, time/date and situation Mood: Depressed Affect: Full Range Thought Processes and Associations: Logical Fund of Knowledge: Fair Thought Content: No suicidal or homicidal thoughts at present Insight: Fair Judgement: Fair  Diagnosis: Maj. depressive disorder recurrent moderate  Treatment Plan: Patient to slowly taper Zoloft. We will then discontinue. I will not reschedule. I will keep patient's chart open for 3 months. Patient  may call with concerns. Jamse Mead, MD

## 2014-08-21 DIAGNOSIS — C801 Malignant (primary) neoplasm, unspecified: Secondary | ICD-10-CM

## 2014-08-21 HISTORY — DX: Malignant (primary) neoplasm, unspecified: C80.1

## 2014-11-21 HISTORY — PX: WRIST SURGERY: SHX841

## 2015-03-27 DIAGNOSIS — M19031 Primary osteoarthritis, right wrist: Secondary | ICD-10-CM | POA: Diagnosis not present

## 2015-04-04 DIAGNOSIS — D2271 Melanocytic nevi of right lower limb, including hip: Secondary | ICD-10-CM | POA: Diagnosis not present

## 2015-04-04 DIAGNOSIS — Z85820 Personal history of malignant melanoma of skin: Secondary | ICD-10-CM | POA: Diagnosis not present

## 2015-04-04 DIAGNOSIS — D224 Melanocytic nevi of scalp and neck: Secondary | ICD-10-CM | POA: Diagnosis not present

## 2015-04-04 DIAGNOSIS — M19031 Primary osteoarthritis, right wrist: Secondary | ICD-10-CM | POA: Diagnosis not present

## 2015-04-04 DIAGNOSIS — L821 Other seborrheic keratosis: Secondary | ICD-10-CM | POA: Diagnosis not present

## 2015-04-04 DIAGNOSIS — D2261 Melanocytic nevi of right upper limb, including shoulder: Secondary | ICD-10-CM | POA: Diagnosis not present

## 2015-04-04 DIAGNOSIS — D1801 Hemangioma of skin and subcutaneous tissue: Secondary | ICD-10-CM | POA: Diagnosis not present

## 2015-04-04 DIAGNOSIS — D225 Melanocytic nevi of trunk: Secondary | ICD-10-CM | POA: Diagnosis not present

## 2015-04-14 DIAGNOSIS — M19031 Primary osteoarthritis, right wrist: Secondary | ICD-10-CM | POA: Diagnosis not present

## 2015-04-14 DIAGNOSIS — R2231 Localized swelling, mass and lump, right upper limb: Secondary | ICD-10-CM | POA: Diagnosis not present

## 2015-04-14 DIAGNOSIS — M25531 Pain in right wrist: Secondary | ICD-10-CM | POA: Diagnosis not present

## 2015-04-14 DIAGNOSIS — Z4789 Encounter for other orthopedic aftercare: Secondary | ICD-10-CM | POA: Diagnosis not present

## 2015-05-13 DIAGNOSIS — Z7951 Long term (current) use of inhaled steroids: Secondary | ICD-10-CM | POA: Diagnosis not present

## 2015-05-13 DIAGNOSIS — R52 Pain, unspecified: Secondary | ICD-10-CM | POA: Diagnosis not present

## 2015-05-13 DIAGNOSIS — Z888 Allergy status to other drugs, medicaments and biological substances status: Secondary | ICD-10-CM | POA: Diagnosis not present

## 2015-05-13 DIAGNOSIS — F329 Major depressive disorder, single episode, unspecified: Secondary | ICD-10-CM | POA: Diagnosis not present

## 2015-05-13 DIAGNOSIS — M19012 Primary osteoarthritis, left shoulder: Secondary | ICD-10-CM | POA: Diagnosis not present

## 2015-05-13 DIAGNOSIS — Z79899 Other long term (current) drug therapy: Secondary | ICD-10-CM | POA: Diagnosis not present

## 2015-05-13 DIAGNOSIS — L4050 Arthropathic psoriasis, unspecified: Secondary | ICD-10-CM | POA: Diagnosis not present

## 2015-05-13 DIAGNOSIS — N400 Enlarged prostate without lower urinary tract symptoms: Secondary | ICD-10-CM | POA: Diagnosis not present

## 2015-05-13 DIAGNOSIS — F17210 Nicotine dependence, cigarettes, uncomplicated: Secondary | ICD-10-CM | POA: Diagnosis not present

## 2015-05-13 DIAGNOSIS — M25512 Pain in left shoulder: Secondary | ICD-10-CM | POA: Diagnosis not present

## 2015-05-14 DIAGNOSIS — N401 Enlarged prostate with lower urinary tract symptoms: Secondary | ICD-10-CM | POA: Diagnosis not present

## 2015-05-14 DIAGNOSIS — E291 Testicular hypofunction: Secondary | ICD-10-CM | POA: Diagnosis not present

## 2015-06-16 ENCOUNTER — Ambulatory Visit (HOSPITAL_COMMUNITY)
Admission: RE | Admit: 2015-06-16 | Discharge: 2015-06-16 | Disposition: A | Discharge status: Home / Self Care | Payer: Medicare Other | Source: Ambulatory Visit | Attending: Orthopedic Surgery | Admitting: Orthopedic Surgery

## 2015-06-16 ENCOUNTER — Other Ambulatory Visit (HOSPITAL_COMMUNITY): Payer: Self-pay | Admitting: Orthopedic Surgery

## 2015-06-16 DIAGNOSIS — Z01818 Encounter for other preprocedural examination: Secondary | ICD-10-CM | POA: Insufficient documentation

## 2015-06-16 DIAGNOSIS — S4992XA Unspecified injury of left shoulder and upper arm, initial encounter: Secondary | ICD-10-CM

## 2015-06-16 DIAGNOSIS — M25512 Pain in left shoulder: Secondary | ICD-10-CM | POA: Diagnosis not present

## 2015-06-16 IMAGING — CR DG ORBITS FOR FOREIGN BODY
2 series · 2 of 2 positions shown · non-contrast
Comparison: None.

CLINICAL DATA: Metal working/exposure; clearance prior to MRI.
64-year-old male

EXAM:
ORBITS FOR FOREIGN BODY - 2 VIEW

[w waters (1 of 2)]
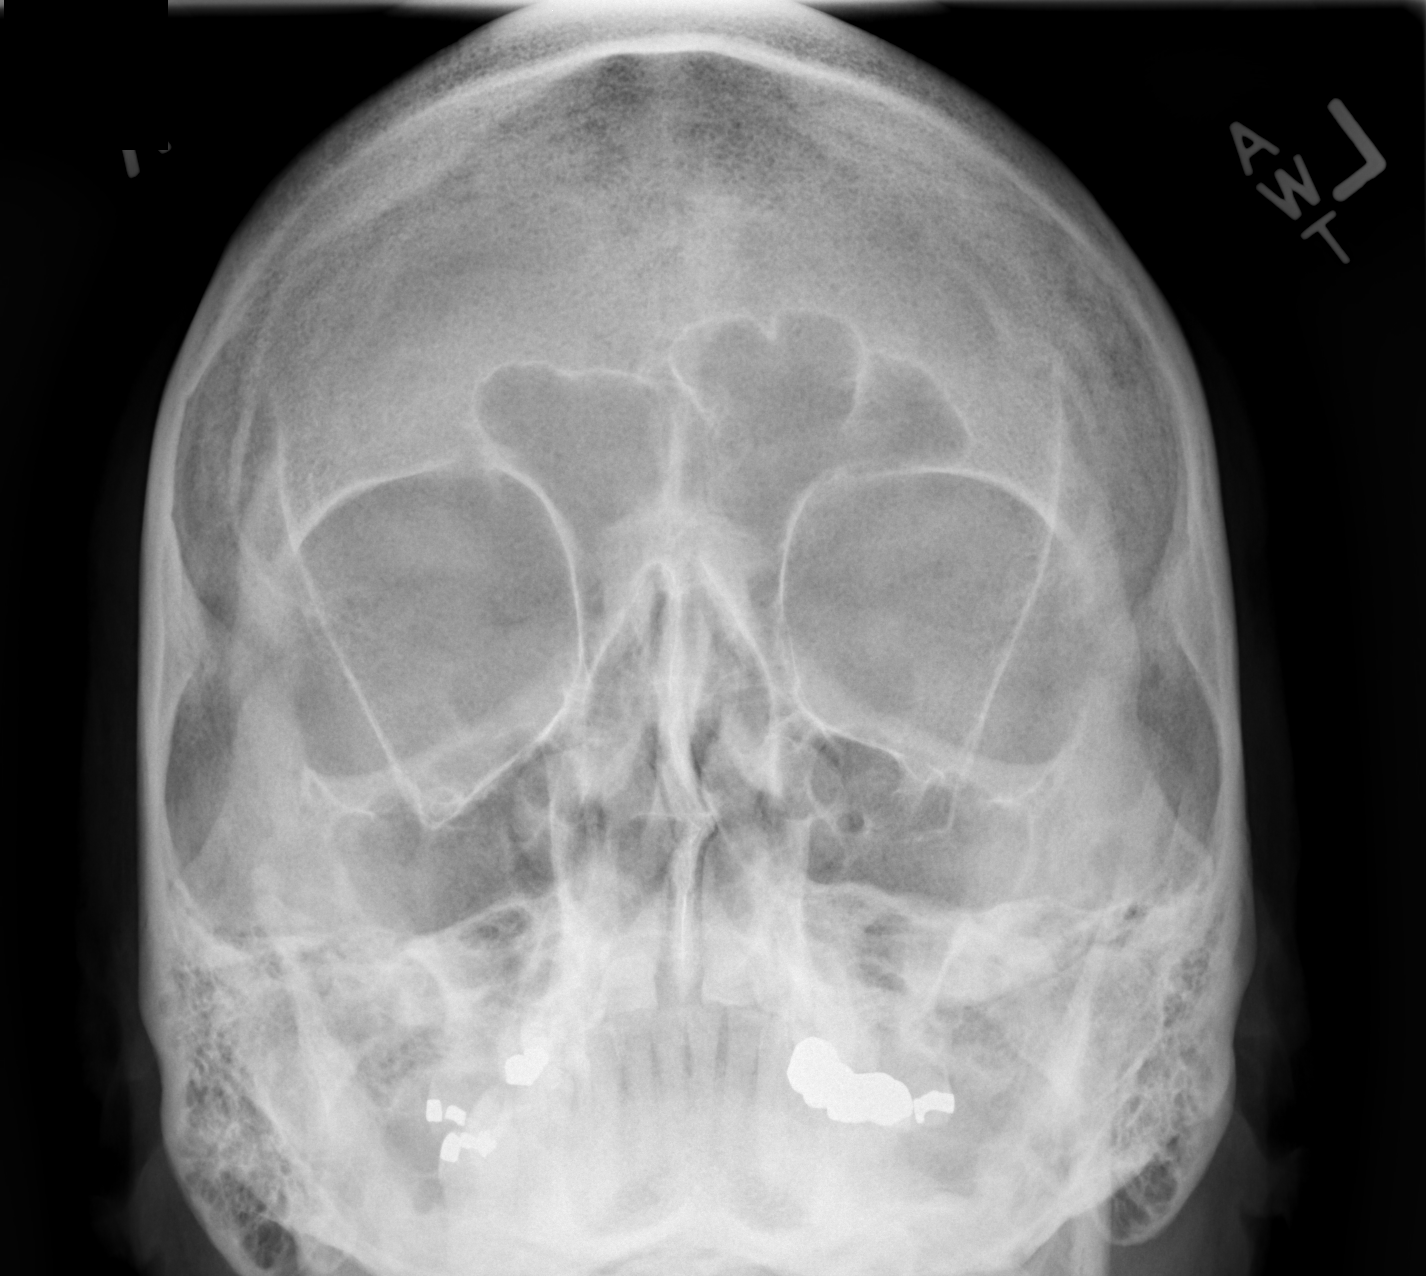

[w waters (2 of 2)]
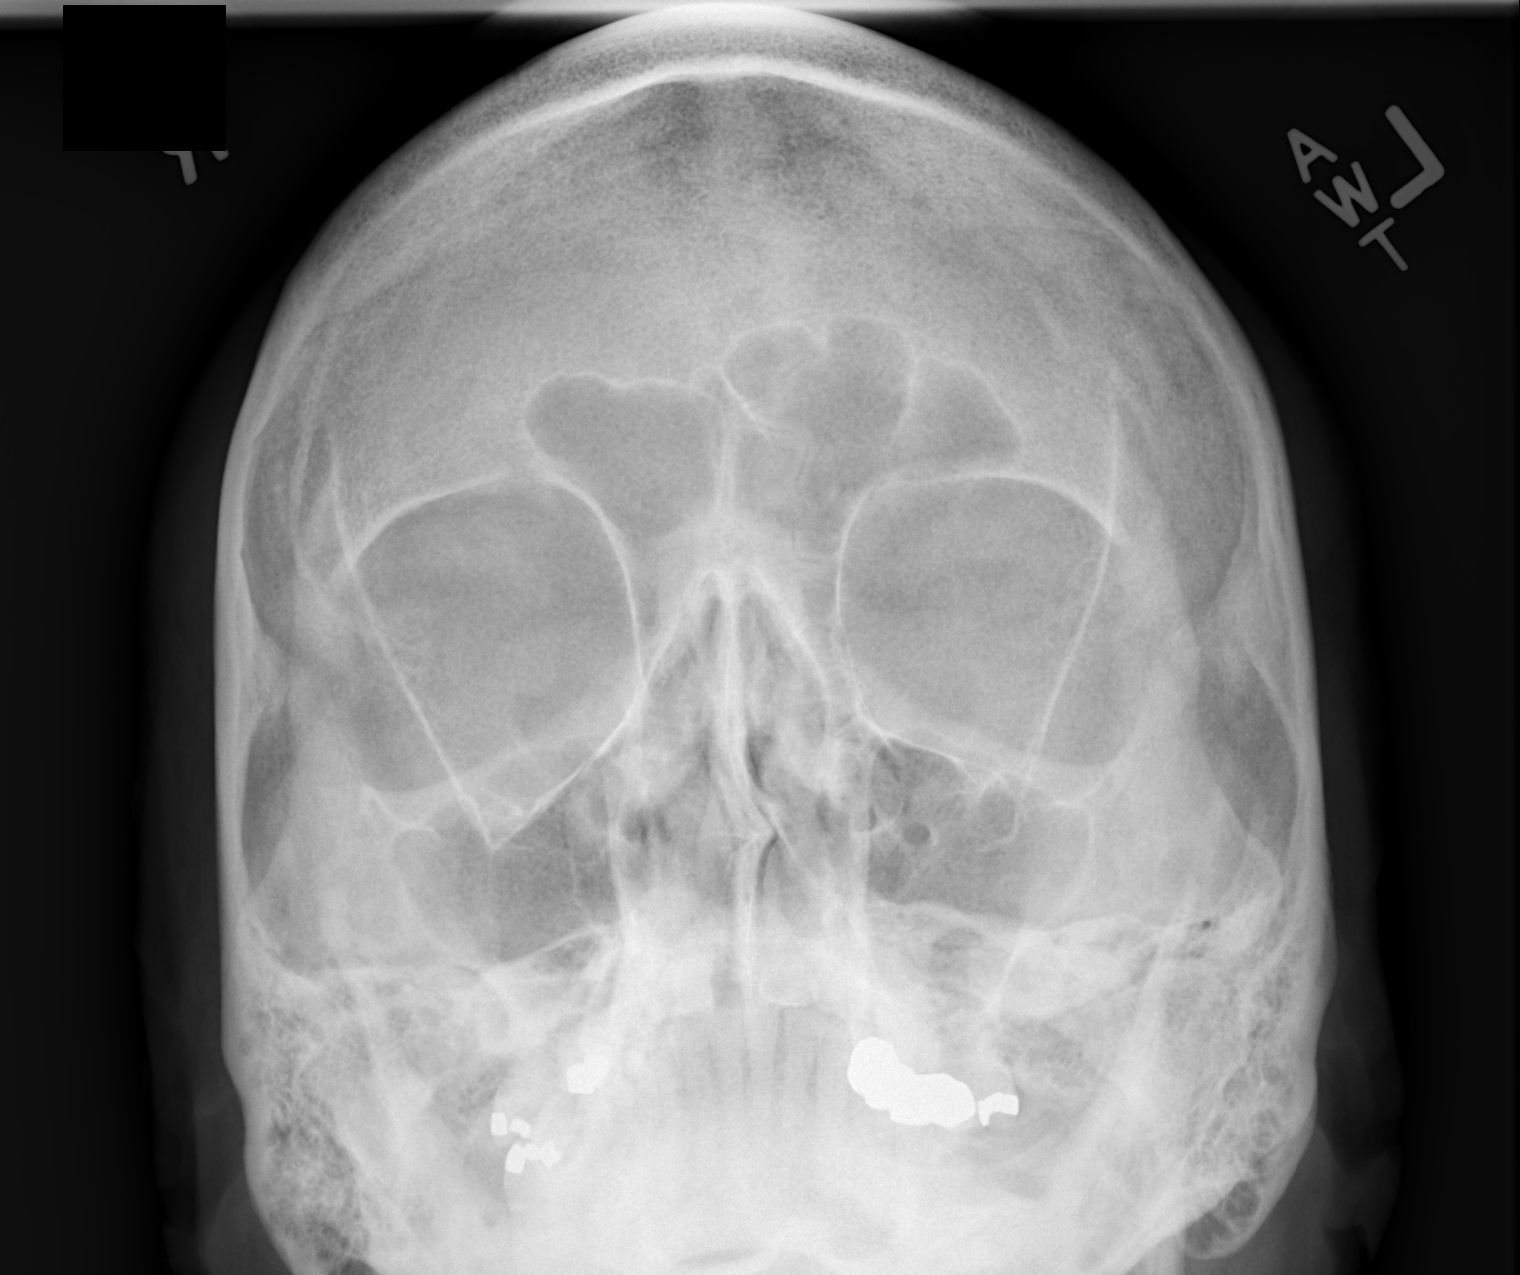

[2 of 2 positions shown; findings below may reference images not displayed]

FINDINGS: There is no evidence of metallic foreign body within the orbits. No
significant bone abnormality identified.
IMPRESSION: No evidence of metallic foreign body within the orbits.

## 2015-06-18 DIAGNOSIS — M25512 Pain in left shoulder: Secondary | ICD-10-CM | POA: Diagnosis not present

## 2015-06-21 DIAGNOSIS — J189 Pneumonia, unspecified organism: Secondary | ICD-10-CM

## 2015-06-21 HISTORY — DX: Pneumonia, unspecified organism: J18.9

## 2015-06-23 DIAGNOSIS — M25512 Pain in left shoulder: Secondary | ICD-10-CM | POA: Diagnosis not present

## 2015-06-23 DIAGNOSIS — M19012 Primary osteoarthritis, left shoulder: Secondary | ICD-10-CM | POA: Diagnosis not present

## 2015-06-23 DIAGNOSIS — S43432A Superior glenoid labrum lesion of left shoulder, initial encounter: Secondary | ICD-10-CM | POA: Diagnosis not present

## 2015-06-23 DIAGNOSIS — M75102 Unspecified rotator cuff tear or rupture of left shoulder, not specified as traumatic: Secondary | ICD-10-CM | POA: Diagnosis not present

## 2015-07-01 DIAGNOSIS — M75112 Incomplete rotator cuff tear or rupture of left shoulder, not specified as traumatic: Secondary | ICD-10-CM | POA: Diagnosis not present

## 2015-07-01 DIAGNOSIS — S472XXA Crushing injury of left shoulder and upper arm, initial encounter: Secondary | ICD-10-CM | POA: Diagnosis not present

## 2015-07-01 DIAGNOSIS — M19012 Primary osteoarthritis, left shoulder: Secondary | ICD-10-CM | POA: Diagnosis not present

## 2015-07-01 DIAGNOSIS — S43432D Superior glenoid labrum lesion of left shoulder, subsequent encounter: Secondary | ICD-10-CM | POA: Diagnosis not present

## 2015-07-01 DIAGNOSIS — M24112 Other articular cartilage disorders, left shoulder: Secondary | ICD-10-CM | POA: Diagnosis not present

## 2015-07-01 DIAGNOSIS — G8918 Other acute postprocedural pain: Secondary | ICD-10-CM | POA: Diagnosis not present

## 2015-07-04 DIAGNOSIS — E291 Testicular hypofunction: Secondary | ICD-10-CM | POA: Diagnosis not present

## 2015-07-04 DIAGNOSIS — E782 Mixed hyperlipidemia: Secondary | ICD-10-CM | POA: Diagnosis not present

## 2015-07-04 DIAGNOSIS — E119 Type 2 diabetes mellitus without complications: Secondary | ICD-10-CM | POA: Diagnosis not present

## 2015-07-12 ENCOUNTER — Encounter (HOSPITAL_BASED_OUTPATIENT_CLINIC_OR_DEPARTMENT_OTHER): Payer: Self-pay | Admitting: Emergency Medicine

## 2015-07-12 ENCOUNTER — Inpatient Hospital Stay (HOSPITAL_BASED_OUTPATIENT_CLINIC_OR_DEPARTMENT_OTHER)
Admission: EM | Admit: 2015-07-12 | Discharge: 2015-07-13 | DRG: 194 | Disposition: A | Discharge status: Home / Self Care | Payer: Medicare Other | Attending: Internal Medicine | Admitting: Internal Medicine

## 2015-07-12 ENCOUNTER — Emergency Department (HOSPITAL_BASED_OUTPATIENT_CLINIC_OR_DEPARTMENT_OTHER): Payer: Medicare Other

## 2015-07-12 DIAGNOSIS — I10 Essential (primary) hypertension: Secondary | ICD-10-CM | POA: Diagnosis present

## 2015-07-12 DIAGNOSIS — F32A Depression, unspecified: Secondary | ICD-10-CM | POA: Diagnosis present

## 2015-07-12 DIAGNOSIS — J189 Pneumonia, unspecified organism: Principal | ICD-10-CM | POA: Diagnosis present

## 2015-07-12 DIAGNOSIS — F329 Major depressive disorder, single episode, unspecified: Secondary | ICD-10-CM | POA: Diagnosis present

## 2015-07-12 DIAGNOSIS — J45909 Unspecified asthma, uncomplicated: Secondary | ICD-10-CM | POA: Diagnosis present

## 2015-07-12 DIAGNOSIS — J45901 Unspecified asthma with (acute) exacerbation: Secondary | ICD-10-CM | POA: Diagnosis present

## 2015-07-12 DIAGNOSIS — K219 Gastro-esophageal reflux disease without esophagitis: Secondary | ICD-10-CM | POA: Diagnosis present

## 2015-07-12 DIAGNOSIS — R0603 Acute respiratory distress: Secondary | ICD-10-CM

## 2015-07-12 DIAGNOSIS — Z79899 Other long term (current) drug therapy: Secondary | ICD-10-CM | POA: Diagnosis not present

## 2015-07-12 DIAGNOSIS — N400 Enlarged prostate without lower urinary tract symptoms: Secondary | ICD-10-CM | POA: Diagnosis present

## 2015-07-12 DIAGNOSIS — R0602 Shortness of breath: Secondary | ICD-10-CM | POA: Diagnosis not present

## 2015-07-12 DIAGNOSIS — G894 Chronic pain syndrome: Secondary | ICD-10-CM | POA: Diagnosis not present

## 2015-07-12 DIAGNOSIS — F17200 Nicotine dependence, unspecified, uncomplicated: Secondary | ICD-10-CM | POA: Diagnosis present

## 2015-07-12 DIAGNOSIS — Z7951 Long term (current) use of inhaled steroids: Secondary | ICD-10-CM

## 2015-07-12 DIAGNOSIS — J159 Unspecified bacterial pneumonia: Secondary | ICD-10-CM | POA: Diagnosis present

## 2015-07-12 HISTORY — DX: Unspecified asthma, uncomplicated: J45.909

## 2015-07-12 LAB — BASIC METABOLIC PANEL
ANION GAP: 12 (ref 5–15)
BUN: 10 mg/dL (ref 6–20)
CALCIUM: 8.8 mg/dL — AB (ref 8.9–10.3)
CHLORIDE: 103 mmol/L (ref 101–111)
CO2: 22 mmol/L (ref 22–32)
Creatinine, Ser: 0.95 mg/dL (ref 0.61–1.24)
GFR calc non Af Amer: >60 mL/min (ref >60)
GLUCOSE: 151 mg/dL — AB (ref 65–99)
Potassium: 3.5 mmol/L (ref 3.5–5.1)
Sodium: 137 mmol/L (ref 135–145)

## 2015-07-12 LAB — CBC WITH DIFFERENTIAL/PLATELET
Basophils Absolute: 0 10*3/uL (ref 0.0–0.1)
Basophils Relative: 0 %
Eosinophils Absolute: 0.5 10*3/uL (ref 0.0–0.7)
Eosinophils Relative: 3 %
HEMATOCRIT: 43.3 % (ref 39.0–52.0)
HEMOGLOBIN: 15.3 g/dL (ref 13.0–17.0)
LYMPHS PCT: 22 %
Lymphs Abs: 3.3 10*3/uL (ref 0.7–4.0)
MCH: 33.4 pg (ref 26.0–34.0)
MCHC: 35.3 g/dL (ref 30.0–36.0)
MCV: 94.5 fL (ref 78.0–100.0)
MONO ABS: 1.2 10*3/uL — AB (ref 0.1–1.0)
MONOS PCT: 8 %
NEUTROS ABS: 10 10*3/uL — AB (ref 1.7–7.7)
NEUTROS PCT: 67 %
Platelets: 287 10*3/uL (ref 150–400)
RBC: 4.58 MIL/uL (ref 4.22–5.81)
RDW: 12.6 % (ref 11.5–15.5)
WBC: 15.1 10*3/uL — ABNORMAL HIGH (ref 4.0–10.5)

## 2015-07-12 MED ORDER — IPRATROPIUM-ALBUTEROL 0.5-2.5 (3) MG/3ML IN SOLN
RESPIRATORY_TRACT | Status: AC
  Administered 2015-07-12: 3 mL
  Filled 2015-07-12: qty 3

## 2015-07-12 MED ORDER — IPRATROPIUM BROMIDE 0.02 % IN SOLN: 0.5000 mg | Freq: Once | RESPIRATORY_TRACT | Status: DC

## 2015-07-12 MED ORDER — MAGNESIUM SULFATE 2 GM/50ML IV SOLN
2.0000 g | Freq: Once | INTRAVENOUS | Status: AC
  Administered 2015-07-12: 2 g via INTRAVENOUS
  Filled 2015-07-12: qty 50

## 2015-07-12 MED ORDER — SODIUM CHLORIDE 0.9 % IV BOLUS (SEPSIS)
1000.0000 mL | Freq: Once | INTRAVENOUS | Status: AC
  Administered 2015-07-12: 1000 mL via INTRAVENOUS

## 2015-07-12 MED ORDER — IPRATROPIUM-ALBUTEROL 0.5-2.5 (3) MG/3ML IN SOLN
RESPIRATORY_TRACT | Status: AC
  Administered 2015-07-12: 23:00:00
  Filled 2015-07-12: qty 3

## 2015-07-12 MED ORDER — ALBUTEROL SULFATE (2.5 MG/3ML) 0.083% IN NEBU
INHALATION_SOLUTION | RESPIRATORY_TRACT | Status: AC
  Administered 2015-07-12: 23:00:00
  Filled 2015-07-12: qty 3

## 2015-07-12 MED ORDER — ALBUTEROL SULFATE (2.5 MG/3ML) 0.083% IN NEBU
INHALATION_SOLUTION | RESPIRATORY_TRACT | Status: AC
  Administered 2015-07-12: 2.5 mg
  Filled 2015-07-12: qty 3

## 2015-07-12 MED ORDER — PREDNISONE 50 MG PO TABS: 60.0000 mg | ORAL_TABLET | Freq: Once | ORAL | Status: DC

## 2015-07-12 MED ORDER — ALBUTEROL (5 MG/ML) CONTINUOUS INHALATION SOLN: 5.0000 mg/h | INHALATION_SOLUTION | RESPIRATORY_TRACT | Status: DC

## 2015-07-12 MED ORDER — METHYLPREDNISOLONE SODIUM SUCC 125 MG IJ SOLR
125.0000 mg | Freq: Once | INTRAMUSCULAR | Status: AC
  Administered 2015-07-12: 125 mg via INTRAVENOUS
  Filled 2015-07-12: qty 2

## 2015-07-12 MED ORDER — IOPAMIDOL (ISOVUE-370) INJECTION 76%
100.0000 mL | Freq: Once | INTRAVENOUS | Status: AC | PRN
  Administered 2015-07-13: 100 mL via INTRAVENOUS

## 2015-07-12 NOTE — ED Notes (Signed)
Delay in getting EKG due to breathing treatment in progress.

## 2015-07-12 NOTE — ED Notes (Signed)
Patient reports that he has been traveling since 1030 this am from [place], [place]. He has become increasingly SOB since Tues. The patient states that he started to feel worse earlier tonight while driving. Patient unable to talk complete sentences and is diaphoretic

## 2015-07-12 NOTE — ED Notes (Signed)
Dr. Oni into room. 

## 2015-07-12 NOTE — ED Provider Notes (Signed)
CSN: [id number]     Arrival date & time 07/12/15  2244 History  By signing my name below, I, Rowan Blase, attest that this documentation has been prepared under the direction and in the presence of Everlene Balls, MD . Electronically Signed: Rowan Blase, Scribe. 07/12/2015. 11:17 PM.   Chief Complaint  Patient presents with  . Shortness of Breath   The history is provided by the patient. No language interpreter was used.   HPI Comments:  Frederick Guzman is a 65 y.o. male with PMHx of HTN and asthma who presents to the Emergency Department complaining of worsening shortness of breath onset ~4 days ago. Pt reports associated cramping in his legs, arms and sides. He notes chest pain and left shoulder pain occur with episodes of shortness of breath; he also notes recent fatigue and leg swelling. Pt has used an inhaler without relief; he notes breathing treatment in triage provided some relief. Pt had surgery on his left shoulder two weeks ago. Pt has been in the car travelling home from [place] for 11 hours today. Denies fever, cough, runny nose or h/o blood clots.  Past Medical History  Diagnosis Date  . Depression   . HTN (hypertension)   . Asthma    Past Surgical History  Procedure Laterality Date  . Shoulder surgery     History reviewed. No pertinent family history. Social History  Substance Use Topics  . Smoking status: Current Every Day Smoker  . Smokeless tobacco: None  . Alcohol Use: No    Review of Systems  Constitutional: Positive for fatigue. Negative for fever.  HENT: Negative for rhinorrhea.   Respiratory: Positive for shortness of breath. Negative for cough.   Cardiovascular: Positive for chest pain and leg swelling.  Musculoskeletal: Positive for myalgias and arthralgias.  All other systems reviewed and are negative.  Allergies  Methotrexate derivatives and Ultram  Home Medications   Prior to Admission medications   Medication Sig Start Date End  Date Taking? Authorizing Provider  albuterol (PROVENTIL HFA;VENTOLIN HFA) 108 (90 Base) MCG/ACT inhaler Inhale 2 puffs into the lungs every 6 (six) hours as needed for wheezing or shortness of breath.   Yes Historical Provider, MD  budesonide-formoterol (SYMBICORT) 80-4.5 MCG/ACT inhaler Inhale 2 puffs into the lungs 2 (two) times daily.   Yes Historical Provider, MD  DULoxetine (CYMBALTA) 60 MG capsule Take 90 mg by mouth daily.   Yes Historical Provider, MD  lisinopril (PRINIVIL,ZESTRIL) 5 MG tablet Take 5 mg by mouth daily.   Yes Historical Provider, MD  oxyCODONE-acetaminophen (PERCOCET/ROXICET) 5-325 MG tablet Take by mouth every 4 (four) hours as needed for severe pain.   Yes Historical Provider, MD  AXIRON 30 MG/ACT SOLN  02/05/12   Historical Provider, MD  CIALIS 5 MG tablet  12/29/11   Historical Provider, MD  clotrimazole-betamethasone (LOTRISONE) cream  11/23/10   Historical Provider, MD  doxycycline (VIBRAMYCIN) 100 MG capsule  12/04/10   Historical Provider, MD  furosemide (LASIX) 40 MG tablet  12/30/11   Historical Provider, MD  HYDROcodone-acetaminophen (VICODIN) 5-500 MG per tablet  04/08/11   Historical Provider, MD  NEXIUM 40 MG capsule  11/21/10   Historical Provider, MD  sertraline (ZOLOFT) 100 MG tablet Take 1 1/2 tablets for two weeks then decrease to one 01/12/12   Alberteen Sam, MD  Tamsulosin HCl (FLOMAX) 0.4 MG CAPS  12/30/10   Historical Provider, MD  traMADol (ULTRAM-ER) 100 MG 24 hr tablet  02/02/11   Historical Provider, MD  BP 131/72 mmHg  Pulse 100  Temp(Src) 98.8 F (37.1 C) (Oral)  Resp 27  SpO2 91% Physical Exam  Constitutional: He is oriented to person, place, and time. Vital signs are normal. He appears well-developed and well-nourished.  Non-toxic appearance. He does not appear ill. No distress.  HENT:  Head: Normocephalic and atraumatic.  Nose: Nose normal.  Mouth/Throat: Oropharynx is clear and moist. No oropharyngeal exudate.  Eyes: Conjunctivae and EOM  are normal. Pupils are equal, round, and reactive to light. No scleral icterus.  Neck: Normal range of motion. Neck supple. No tracheal deviation, no edema, no erythema and normal range of motion present. No thyroid mass and no thyromegaly present.  Cardiovascular: Regular rhythm, S1 normal, S2 normal, normal heart sounds, intact distal pulses and normal pulses.  Tachycardia present.  Exam reveals no gallop and no friction rub.   No murmur heard. Pulmonary/Chest: Effort normal. Tachypnea noted. No respiratory distress. He has wheezes. He has no rhonchi. He has no rales.  inspiratory and expiratory wheezing BL lung fields  Abdominal: Soft. Normal appearance and bowel sounds are normal. He exhibits no distension, no ascites and no mass. There is no hepatosplenomegaly. There is no tenderness. There is no rebound, no guarding and no CVA tenderness.  Musculoskeletal: Normal range of motion. He exhibits no tenderness.  LLE greater than RLE edema; LUE in a sling  Lymphadenopathy:    He has no cervical adenopathy.  Neurological: He is alert and oriented to person, place, and time. He has normal strength. No cranial nerve deficit or sensory deficit.  Skin: Skin is warm, dry and intact. No petechiae and no rash noted. He is not diaphoretic. No erythema. No pallor.  Psychiatric: He has a normal mood and affect. His behavior is normal. Judgment normal.  Nursing note and vitals reviewed.   ED Course  Procedures  DIAGNOSTIC STUDIES:  Oxygen Saturation is 92% on RA, low by my interpretation.    COORDINATION OF CARE:  11:10 PM Will administer breathing treatment, prednisone, magnesium sulfate and fluids. Will order CT angio chest PE, CBC and BMP. Discussed treatment plan with pt at bedside and pt agreed to plan.  Labs Review Labs Reviewed  CBC WITH DIFFERENTIAL/PLATELET - Abnormal; Notable for the following:    WBC 15.1 (*)    Neutro Abs 10.0 (*)    Monocytes Absolute 1.2 (*)    All other components  within normal limits  BASIC METABOLIC PANEL - Abnormal; Notable for the following:    Glucose, Bld 151 (*)    Calcium 8.8 (*)    All other components within normal limits  CULTURE, BLOOD (ROUTINE X 2)  CULTURE, BLOOD (ROUTINE X 2)    Imaging Review Ct Angio Chest Pe W/cm &/or Wo Cm  07/13/2015  CLINICAL DATA:  Shortness of breath for 4 days. Cramping in the legs, arms, and sides. Chest pain and left shoulder pain with shortness of breath. Fatigue and leg swelling. Surgery to left shoulder 2 weeks ago. 7 hour car trip today. EXAM: CT ANGIOGRAPHY CHEST WITH CONTRAST TECHNIQUE: Multidetector CT imaging of the chest was performed using the standard protocol during bolus administration of intravenous contrast. Multiplanar CT image reconstructions and MIPs were obtained to evaluate the vascular anatomy. CONTRAST:  100 mL Isovue 370 COMPARISON:  None. FINDINGS: Technically adequate study with moderately good opacification of the central and segmental pulmonary arteries. No focal filling defects demonstrated. No evidence of significant pulmonary embolus. Normal heart size. Normal caliber thoracic aorta with scattered  calcification. Great vessel origins are patent. No aortic dissection. Scattered lymph nodes in the mediastinum are not pathologically enlarged and are likely reactive. Esophagus is decompressed. There is focal airspace consolidation in the right middle lung most consistent with pneumonia. Peripheral interstitial changes and emphysematous changes in the lungs. Bronchial wall thickening with nodular peribronchial ground-glass infiltrates in the lung bases suggesting bronchopneumonia. No pleural effusions. No pneumothorax. Included portions of the upper abdominal organs are grossly unremarkable. The degenerative changes in the spine. No destructive bone lesions. Review of the MIP images confirms the above findings. IMPRESSION: No evidence of significant pulmonary embolus. Right middle lobe pneumonia  with patchy nodular infiltration in the lung bases suggesting bronchopneumonia. Peripheral interstitial fibrosis and emphysematous changes. Electronically Signed   By: Lucienne Capers M.D.   On: 07/13/2015 00:31   I have personally reviewed and evaluated these images and lab results as part of my medical decision-making.   EKG Interpretation None      MDM   Final diagnoses:  None  Patient presents emergency department for sudden onset shortness of breath and chest pain. The pain is worse with a deep breath. He has risk factors for pulmonary embolism with his recent surgery and long car ride. He drove 11 hours to [place] a week ago, and he has just shown 11 hours back. He is wheezing in all lung fields with a history of asthma. He states albuterol has helped him throughout the week. He was given albuterol treatment, Solu-Medrol, magnesium. Will obtain CT scan for further evaluation.  CT reveals pneumonia.  WBC is 15.  Patient had recent outpatient surgery 2 weeks ago which does not qualify for HCAP.  He was given levoquin and blood cultures were drawn. Will consult with [facility] for admission for hypoxic pneumonia.    CRITICAL CARE Performed by: Everlene Balls   Total critical care time: 45 minutes - respiratory distress, hypoxic pneumonia  Critical care time was exclusive of separately billable procedures and treating other patients.  Critical care was necessary to treat or prevent imminent or life-threatening deterioration.  Critical care was time spent personally by me on the following activities: development of treatment plan with patient and/or surrogate as well as nursing, discussions with consultants, evaluation of patient's response to treatment, examination of patient, obtaining history from patient or surrogate, ordering and performing treatments and interventions, ordering and review of laboratory studies, ordering and review of radiographic studies, pulse oximetry and  re-evaluation of patient's condition.     I personally performed the services described in this documentation, which was scribed in my presence. The recorded information has been reviewed and is accurate.     Everlene Balls, MD 07/13/15 0110

## 2015-07-13 DIAGNOSIS — I10 Essential (primary) hypertension: Secondary | ICD-10-CM | POA: Diagnosis present

## 2015-07-13 DIAGNOSIS — F17200 Nicotine dependence, unspecified, uncomplicated: Secondary | ICD-10-CM

## 2015-07-13 DIAGNOSIS — F329 Major depressive disorder, single episode, unspecified: Secondary | ICD-10-CM

## 2015-07-13 DIAGNOSIS — J45901 Unspecified asthma with (acute) exacerbation: Secondary | ICD-10-CM | POA: Diagnosis not present

## 2015-07-13 DIAGNOSIS — Z79899 Other long term (current) drug therapy: Secondary | ICD-10-CM | POA: Diagnosis not present

## 2015-07-13 DIAGNOSIS — J189 Pneumonia, unspecified organism: Secondary | ICD-10-CM | POA: Diagnosis not present

## 2015-07-13 DIAGNOSIS — N400 Enlarged prostate without lower urinary tract symptoms: Secondary | ICD-10-CM | POA: Diagnosis present

## 2015-07-13 DIAGNOSIS — G894 Chronic pain syndrome: Secondary | ICD-10-CM | POA: Diagnosis present

## 2015-07-13 DIAGNOSIS — K219 Gastro-esophageal reflux disease without esophagitis: Secondary | ICD-10-CM

## 2015-07-13 DIAGNOSIS — R0602 Shortness of breath: Secondary | ICD-10-CM | POA: Diagnosis not present

## 2015-07-13 DIAGNOSIS — J45909 Unspecified asthma, uncomplicated: Secondary | ICD-10-CM | POA: Diagnosis present

## 2015-07-13 DIAGNOSIS — F32A Depression, unspecified: Secondary | ICD-10-CM | POA: Diagnosis present

## 2015-07-13 DIAGNOSIS — Z7951 Long term (current) use of inhaled steroids: Secondary | ICD-10-CM | POA: Diagnosis not present

## 2015-07-13 DIAGNOSIS — J159 Unspecified bacterial pneumonia: Secondary | ICD-10-CM | POA: Diagnosis present

## 2015-07-13 LAB — CBC WITH DIFFERENTIAL/PLATELET
BASOS ABS: 0 10*3/uL (ref 0.0–0.1)
BASOS PCT: 0 %
Eosinophils Absolute: 0 10*3/uL (ref 0.0–0.7)
Eosinophils Relative: 0 %
HEMATOCRIT: 42.1 % (ref 39.0–52.0)
HEMOGLOBIN: 14.9 g/dL (ref 13.0–17.0)
LYMPHS PCT: 12 %
Lymphs Abs: 1.1 10*3/uL (ref 0.7–4.0)
MCH: 33.7 pg (ref 26.0–34.0)
MCHC: 35.4 g/dL (ref 30.0–36.0)
MCV: 95.2 fL (ref 78.0–100.0)
Monocytes Absolute: 0.3 10*3/uL (ref 0.1–1.0)
Monocytes Relative: 3 %
NEUTROS ABS: 8.3 10*3/uL — AB (ref 1.7–7.7)
NEUTROS PCT: 85 %
Platelets: 259 10*3/uL (ref 150–400)
RBC: 4.42 MIL/uL (ref 4.22–5.81)
RDW: 12.7 % (ref 11.5–15.5)
WBC: 9.7 10*3/uL (ref 4.0–10.5)

## 2015-07-13 LAB — COMPREHENSIVE METABOLIC PANEL
ALT: 26 U/L (ref 17–63)
AST: 26 U/L (ref 15–41)
Albumin: 2.9 g/dL — ABNORMAL LOW (ref 3.5–5.0)
Alkaline Phosphatase: 81 U/L (ref 38–126)
Anion gap: 10 (ref 5–15)
BUN: 10 mg/dL (ref 6–20)
CHLORIDE: 105 mmol/L (ref 101–111)
CO2: 26 mmol/L (ref 22–32)
Calcium: 9.1 mg/dL (ref 8.9–10.3)
Creatinine, Ser: 1.04 mg/dL (ref 0.61–1.24)
Glucose, Bld: 235 mg/dL — ABNORMAL HIGH (ref 65–99)
POTASSIUM: 4.3 mmol/L (ref 3.5–5.1)
SODIUM: 141 mmol/L (ref 135–145)
Total Bilirubin: 0.4 mg/dL (ref 0.3–1.2)
Total Protein: 7.1 g/dL (ref 6.5–8.1)

## 2015-07-13 LAB — PROTIME-INR
INR: 1.19 (ref 0.00–1.49)
Prothrombin Time: 15.3 seconds — ABNORMAL HIGH (ref 11.6–15.2)

## 2015-07-13 LAB — INFLUENZA PANEL BY PCR (TYPE A & B)
H1N1 flu by pcr: NOT DETECTED
INFLAPCR: NEGATIVE
Influenza B By PCR: NEGATIVE

## 2015-07-13 LAB — MAGNESIUM: MAGNESIUM: 2.2 mg/dL (ref 1.7–2.4)

## 2015-07-13 LAB — TSH: TSH: 0.644 u[IU]/mL (ref 0.350–4.500)

## 2015-07-13 IMAGING — CT CT ANGIO CHEST
2 of 7 series · 18 of 36 positions shown · IV contrast (isovue)
Comparison: None.

CLINICAL DATA: Shortness of breath for 4 days. Cramping in the
legs, arms, and sides. Chest pain and left shoulder pain with
shortness of breath. Fatigue and leg swelling. Surgery to left
shoulder 2 weeks ago. 7 hour car trip today.

EXAM:
CT ANGIOGRAPHY CHEST WITH CONTRAST
TECHNIQUE: Multidetector CT imaging of the chest was performed using the
standard protocol during bolus administration of intravenous
contrast. Multiplanar CT image reconstructions and MIPs were
obtained to evaluate the vascular anatomy.
CONTRAST:  100 mL Isovue 370

[Series 7: pe coronal mpr · coronal · 0.60mm/px · 1 of 130 slices shown]
[im 65/130  mediastinal]
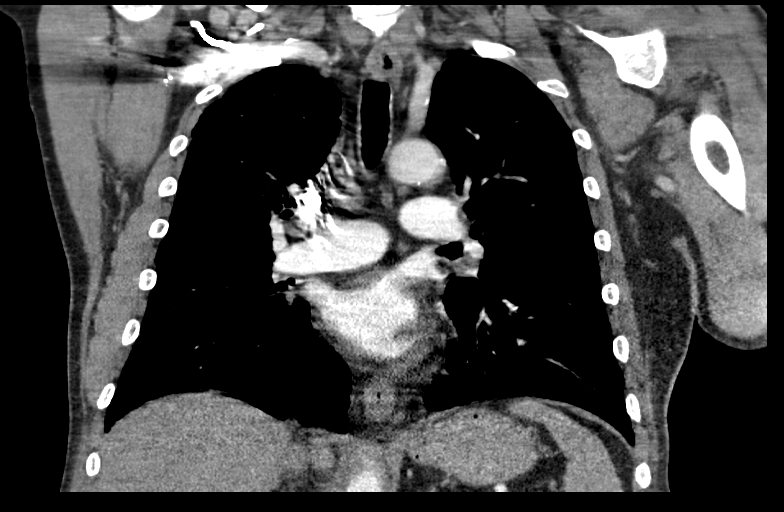

[Series 11: pe thins · axial · 0.78mm/px · z∈[-344,-84]mm · 17 of 290 slices shown]
[im 15/290  lung]
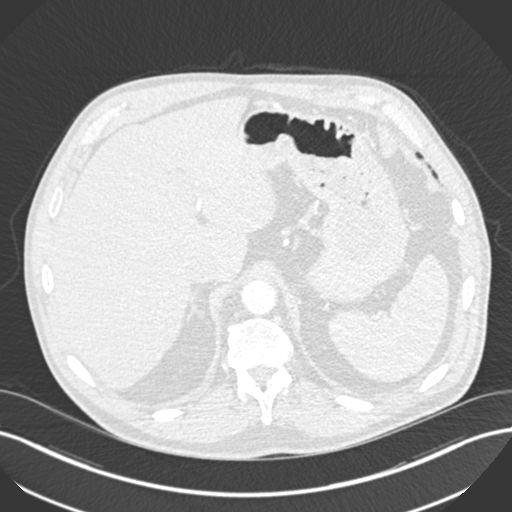
[im 29/290  mediastinal]
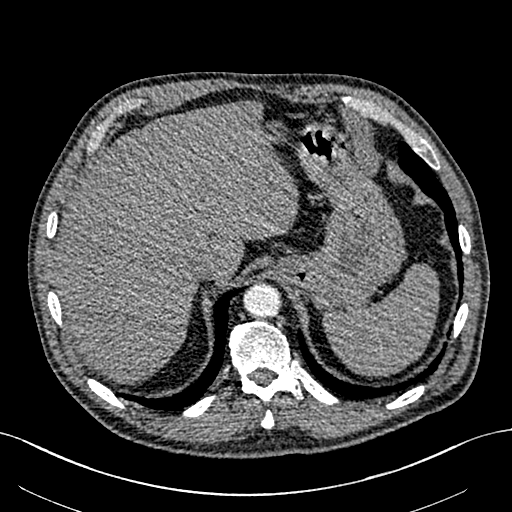
[im 44/290  lung]
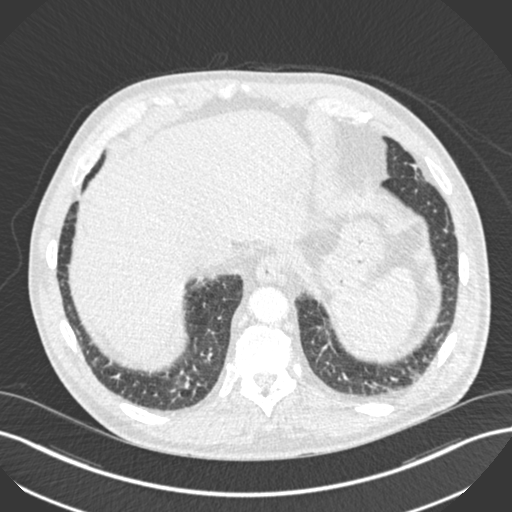
[im 58/290  mediastinal]
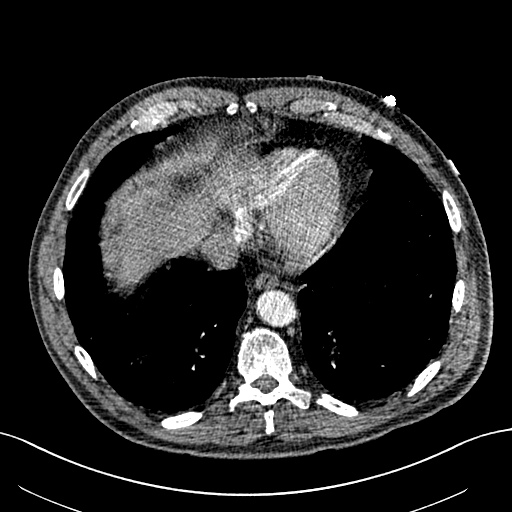
[im 87/290  lung]
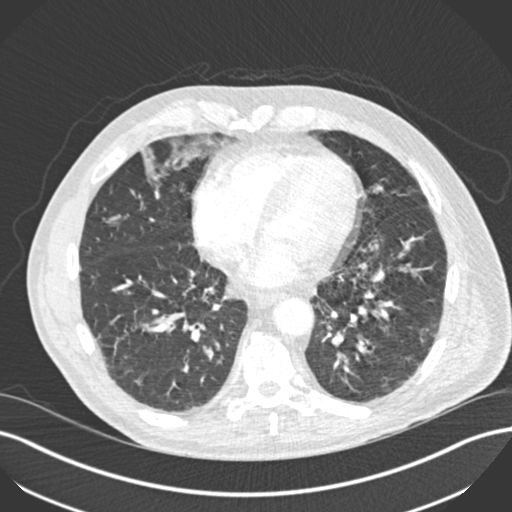
[im 102/290  mediastinal]
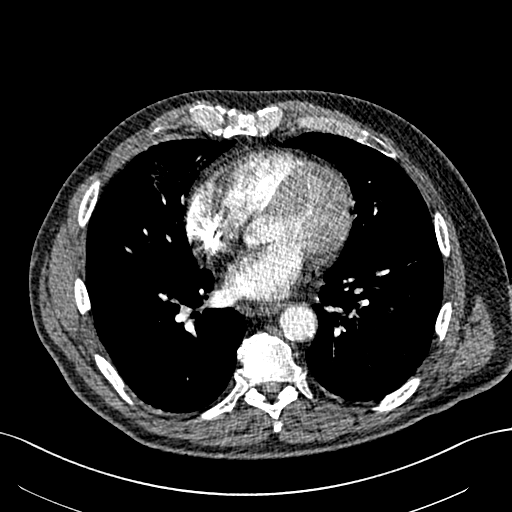
[im 116/290  lung]
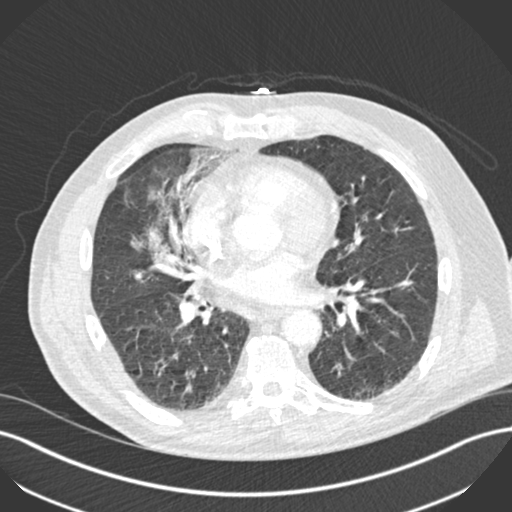
[im 131/290  mediastinal]
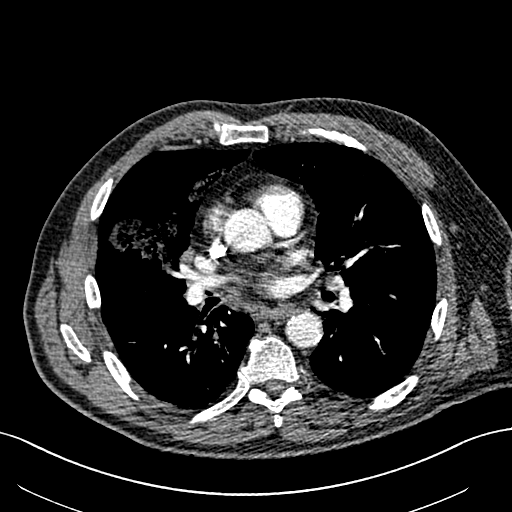
[im 145/290  lung]
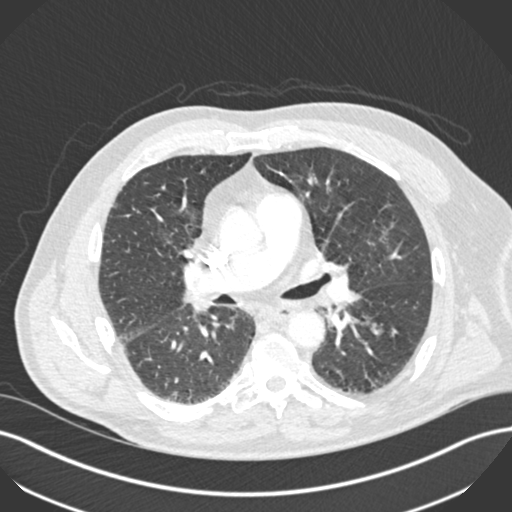
[im 159/290  mediastinal]
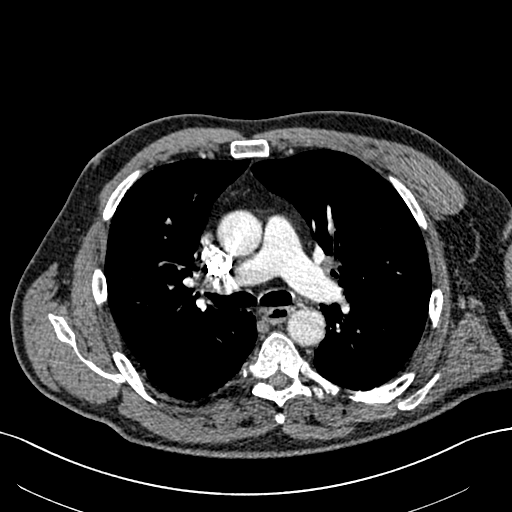
[im 174/290  lung]
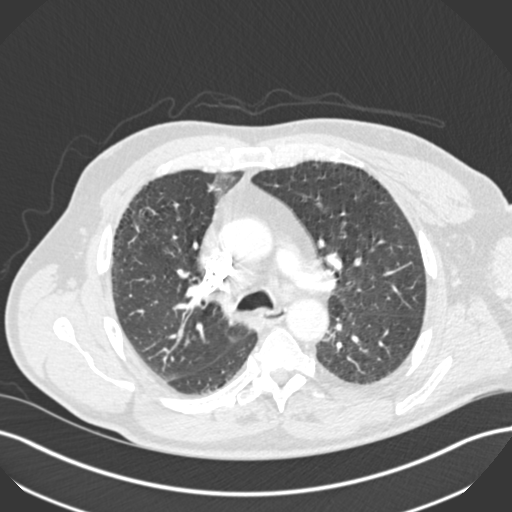
[im 188/290  mediastinal]
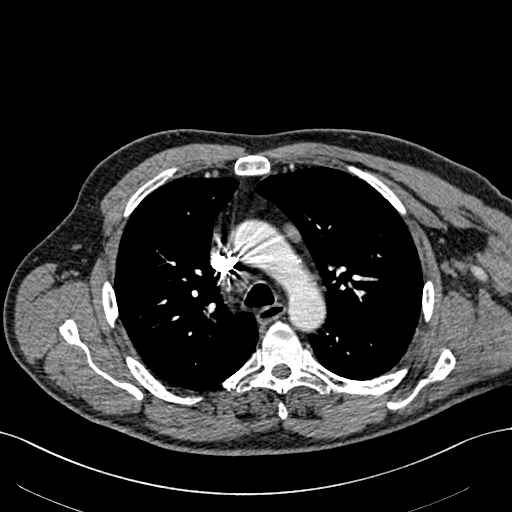
[im 203/290  lung]
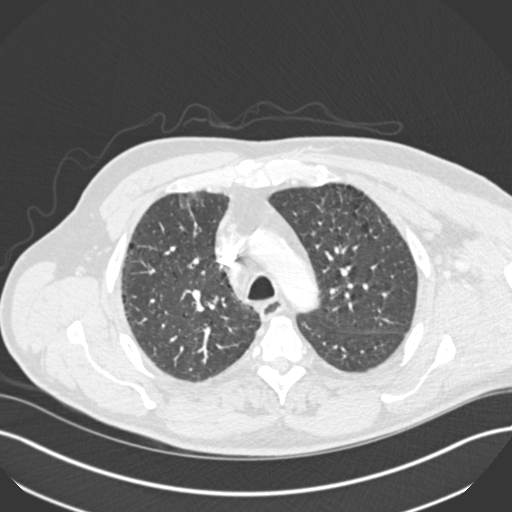
[im 232/290  mediastinal]
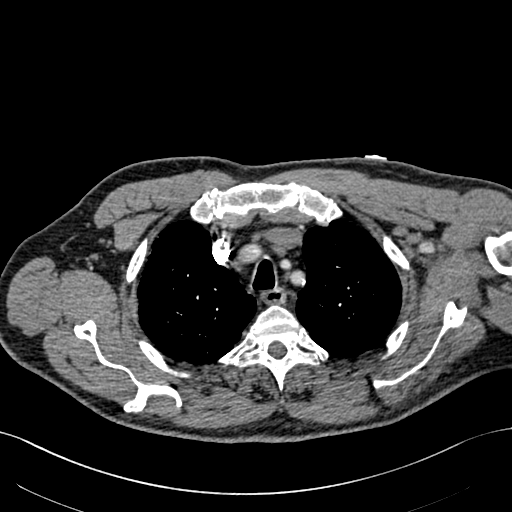
[im 246/290  lung]
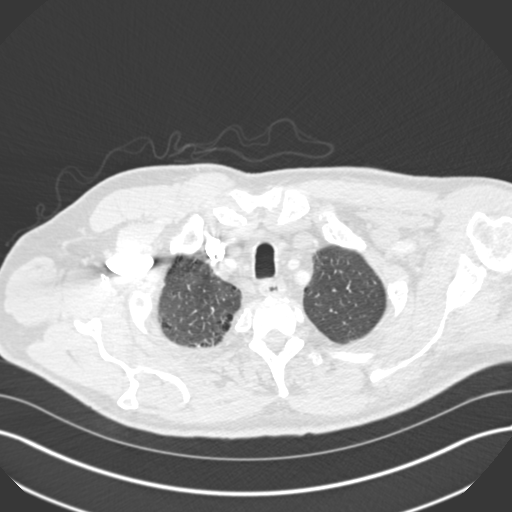
[im 261/290  mediastinal]
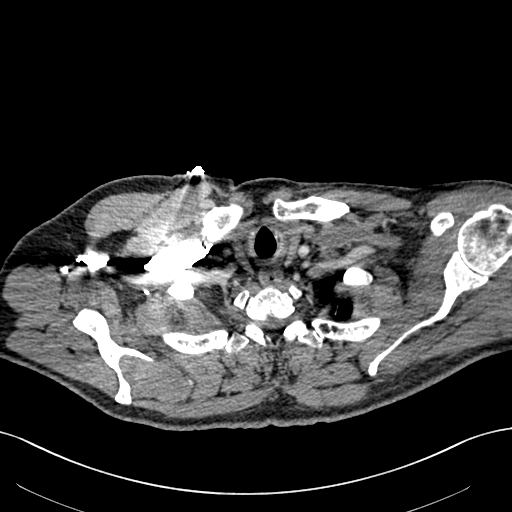
[im 275/290  lung]
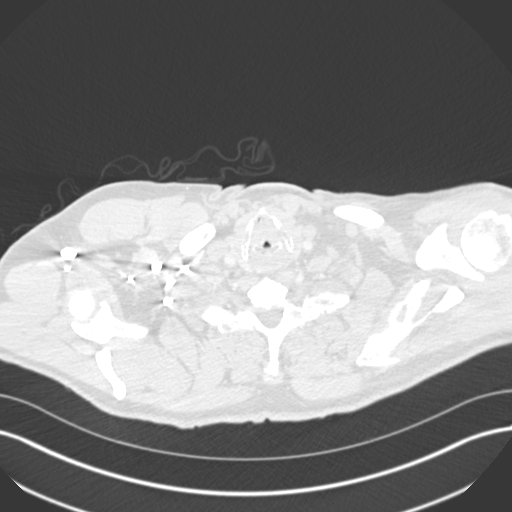

[18 of 36 positions shown; findings below may reference images not displayed]

FINDINGS: Technically adequate study with moderately good opacification of the
central and segmental pulmonary arteries. No focal filling defects
demonstrated. No evidence of significant pulmonary embolus.

Normal heart size. Normal caliber thoracic aorta with scattered
calcification. Great vessel origins are patent. No aortic
dissection. Scattered lymph nodes in the mediastinum are not
pathologically enlarged and are likely reactive. Esophagus is
decompressed.

There is focal airspace consolidation in the right middle lung most
consistent with pneumonia. Peripheral interstitial changes and
emphysematous changes in the lungs. Bronchial wall thickening with
nodular peribronchial ground-glass infiltrates in the lung bases
suggesting bronchopneumonia. No pleural effusions. No pneumothorax.

Included portions of the upper abdominal organs are grossly
unremarkable. The degenerative changes in the spine. No destructive
bone lesions.

Review of the MIP images confirms the above findings.
IMPRESSION: No evidence of significant pulmonary embolus. Right middle lobe
pneumonia with patchy nodular infiltration in the lung bases
suggesting bronchopneumonia. Peripheral interstitial fibrosis and
emphysematous changes.

## 2015-07-13 MED ORDER — NICOTINE 21 MG/24HR TD PT24: 21.0000 mg | MEDICATED_PATCH | Freq: Every day | TRANSDERMAL | Status: DC

## 2015-07-13 MED ORDER — VANCOMYCIN HCL IN DEXTROSE 1-5 GM/200ML-% IV SOLN
1000.0000 mg | Freq: Three times a day (TID) | INTRAVENOUS | Status: DC
  Filled 2015-07-13 (×4): qty 200

## 2015-07-13 MED ORDER — SODIUM CHLORIDE 0.9 % IV SOLN: INTRAVENOUS | Status: DC

## 2015-07-13 MED ORDER — ALBUTEROL SULFATE (2.5 MG/3ML) 0.083% IN NEBU
INHALATION_SOLUTION | RESPIRATORY_TRACT | Status: AC
  Administered 2015-07-13: 2.5 mg
  Filled 2015-07-13: qty 3

## 2015-07-13 MED ORDER — IPRATROPIUM-ALBUTEROL 0.5-2.5 (3) MG/3ML IN SOLN
3.0000 mL | Freq: Four times a day (QID) | RESPIRATORY_TRACT | Status: DC
  Administered 2015-07-13: 3 mL via RESPIRATORY_TRACT
  Filled 2015-07-13: qty 3

## 2015-07-13 MED ORDER — MOMETASONE FURO-FORMOTEROL FUM 100-5 MCG/ACT IN AERO
2.0000 | INHALATION_SPRAY | Freq: Two times a day (BID) | RESPIRATORY_TRACT | Status: DC
  Administered 2015-07-13: 2 via RESPIRATORY_TRACT
  Filled 2015-07-13: qty 8.8

## 2015-07-13 MED ORDER — OXYCODONE-ACETAMINOPHEN 5-325 MG PO TABS
1.0000 | ORAL_TABLET | ORAL | Status: DC | PRN
Start: 2015-07-13 — End: 2015-07-13

## 2015-07-13 MED ORDER — IPRATROPIUM-ALBUTEROL 0.5-2.5 (3) MG/3ML IN SOLN: 3.0000 mL | Freq: Four times a day (QID) | RESPIRATORY_TRACT | Status: DC | PRN

## 2015-07-13 MED ORDER — IPRATROPIUM-ALBUTEROL 0.5-2.5 (3) MG/3ML IN SOLN
3.0000 mL | Freq: Three times a day (TID) | RESPIRATORY_TRACT | Status: DC
  Administered 2015-07-13: 3 mL via RESPIRATORY_TRACT
  Filled 2015-07-13: qty 3

## 2015-07-13 MED ORDER — DULOXETINE HCL 30 MG PO CPEP
90.0000 mg | ORAL_CAPSULE | Freq: Every day | ORAL | Status: DC
Start: 2015-07-13 — End: 2015-07-13
  Administered 2015-07-13: 90 mg via ORAL
  Filled 2015-07-13: qty 1

## 2015-07-13 MED ORDER — LEVOFLOXACIN 750 MG PO TABS: 750.0000 mg | ORAL_TABLET | Freq: Every day | ORAL | Status: DC

## 2015-07-13 MED ORDER — PREDNISONE 20 MG PO TABS: 40.0000 mg | ORAL_TABLET | Freq: Every day | ORAL | Status: DC

## 2015-07-13 MED ORDER — DEXTROSE 5 % IV SOLN
1.0000 g | Freq: Three times a day (TID) | INTRAVENOUS | Status: DC
  Administered 2015-07-13 (×2): 1 g via INTRAVENOUS
  Filled 2015-07-13 (×4): qty 1

## 2015-07-13 MED ORDER — LEVOFLOXACIN IN D5W 750 MG/150ML IV SOLN
750.0000 mg | Freq: Once | INTRAVENOUS | Status: AC
  Administered 2015-07-13: 750 mg via INTRAVENOUS
  Filled 2015-07-13: qty 150

## 2015-07-13 MED ORDER — ALBUTEROL SULFATE (2.5 MG/3ML) 0.083% IN NEBU: 2.5000 mg | INHALATION_SOLUTION | Freq: Four times a day (QID) | RESPIRATORY_TRACT | Status: DC | PRN

## 2015-07-13 MED ORDER — GUAIFENESIN-DM 100-10 MG/5ML PO SYRP: 5.0000 mL | ORAL_SOLUTION | ORAL | Status: DC | PRN

## 2015-07-13 MED ORDER — PANTOPRAZOLE SODIUM 40 MG PO TBEC
40.0000 mg | DELAYED_RELEASE_TABLET | Freq: Every day | ORAL | Status: DC
  Administered 2015-07-13: 40 mg via ORAL
  Filled 2015-07-13: qty 1

## 2015-07-13 MED ORDER — LISINOPRIL 5 MG PO TABS
5.0000 mg | ORAL_TABLET | Freq: Every day | ORAL | Status: DC
  Administered 2015-07-13: 5 mg via ORAL
  Filled 2015-07-13: qty 1

## 2015-07-13 MED ORDER — ENOXAPARIN SODIUM 40 MG/0.4ML ~~LOC~~ SOLN
40.0000 mg | SUBCUTANEOUS | Status: DC
  Administered 2015-07-13: 40 mg via SUBCUTANEOUS
  Filled 2015-07-13: qty 0.4

## 2015-07-13 MED ORDER — TAMSULOSIN HCL 0.4 MG PO CAPS: 0.4000 mg | ORAL_CAPSULE | Freq: Every day | ORAL | Status: DC

## 2015-07-13 MED ORDER — ACETAMINOPHEN 325 MG PO TABS
650.0000 mg | ORAL_TABLET | Freq: Once | ORAL | Status: AC
  Administered 2015-07-13: 650 mg via ORAL
  Filled 2015-07-13: qty 2

## 2015-07-13 NOTE — ED Notes (Signed)
RT at BS.

## 2015-07-13 NOTE — ED Notes (Signed)
NAD, alert, interactive, VSS, breathing easier, "feel better", to CT.

## 2015-07-13 NOTE — H&P (Addendum)
[facility] History and Physical   Patient: Frederick Guzman [id number]   PCP: [place], JULIE M, NP DOB: 02-Jun-1950   DOA: 07/12/2015   DOS: 07/13/2015   DOS: the patient was seen and examined on 07/13/2015  Referring physician: Dr. Claudine Mouton Chief Complaint: cough and shortness of breath  HPI: Frederick Guzman is a 65 y.o. male with Past medical history of Asthma, hypertension, recent shoulder Surgery 10 days ago. The patient is presenting with complaints of cough and shortness of breath as well as difficulty breathing. This is been ongoing for last few days. He also complains of some fever. Denies having any chest pain. Denies having any leg swelling or leg tenderness. No abdominal pain no nausea no vomiting or diarrhea. He denies any recent change in his medications. Apparently the patient has been traveling home from [place] for 11 hours today. 10 days ago the patient has underwent surgery of the left shoulder  The patient is coming from home  At his baseline ambulates with support And is independent for most of his ADL; manages his medication on his own.  Review of Systems: as mentioned in the history of present illness.  A comprehensive review of the other systems is negative.  Past Medical History  Diagnosis Date  . Depression   . HTN (hypertension)   . Asthma    Past Surgical History  Procedure Laterality Date  . Shoulder surgery     Social History:  reports that he has been smoking.  He does not have any smokeless tobacco history on file. He reports that he does not drink alcohol or use illicit drugs.  Allergies  Allergen Reactions  . Methotrexate Derivatives Other (See Comments)  . Ultram [Tramadol Hcl]     History reviewed. No pertinent family history.  Prior to Admission medications   Medication Sig Start Date End Date Taking? Authorizing Provider  albuterol (PROVENTIL HFA;VENTOLIN HFA) 108 (90 Base) MCG/ACT inhaler Inhale 2 puffs into the lungs every  6 (six) hours as needed for wheezing or shortness of breath.   Yes Historical Provider, MD  budesonide-formoterol (SYMBICORT) 80-4.5 MCG/ACT inhaler Inhale 2 puffs into the lungs 2 (two) times daily.   Yes Historical Provider, MD  DULoxetine (CYMBALTA) 60 MG capsule Take 90 mg by mouth daily.   Yes Historical Provider, MD  lisinopril (PRINIVIL,ZESTRIL) 5 MG tablet Take 5 mg by mouth daily.   Yes Historical Provider, MD  oxyCODONE-acetaminophen (PERCOCET/ROXICET) 5-325 MG tablet Take by mouth every 4 (four) hours as needed for severe pain.   Yes Historical Provider, MD  AXIRON 30 MG/ACT SOLN  02/05/12   Historical Provider, MD  CIALIS 5 MG tablet  12/29/11   Historical Provider, MD  clotrimazole-betamethasone (LOTRISONE) cream  11/23/10   Historical Provider, MD  doxycycline (VIBRAMYCIN) 100 MG capsule  12/04/10   Historical Provider, MD  furosemide (LASIX) 40 MG tablet  12/30/11   Historical Provider, MD  HYDROcodone-acetaminophen (VICODIN) 5-500 MG per tablet  04/08/11   Historical Provider, MD  NEXIUM 40 MG capsule  11/21/10   Historical Provider, MD  sertraline (ZOLOFT) 100 MG tablet Take 1 1/2 tablets for two weeks then decrease to one 01/12/12   Alberteen Sam, MD  Tamsulosin HCl (FLOMAX) 0.4 MG CAPS  12/30/10   Historical Provider, MD  traMADol (ULTRAM-ER) 100 MG 24 hr tablet  02/02/11   Historical Provider, MD    Physical Exam: Filed Vitals:   07/13/15 0212 07/13/15 0230 07/13/15 0300 07/13/15 0409  BP:  128/79 144/78 130/60  Pulse:  95 92 85  Temp: 99.8 F (37.7 C)   98.1 F (36.7 C)  TempSrc: Oral   Oral  Resp:  16 20 18   Height:    6\' 1"  (1.854 m)  Weight:    92.216 kg (203 lb 4.8 oz)  SpO2:  91% 92% 95%    General: Alert, Awake and Oriented to Time, Place and Person. Appear in mild distress Eyes: PERRL, Conjunctiva normal ENT: Oral Mucosa clear moist. Neck: difficult to assess  JVD, no Abnormal Mass Or lumps Cardiovascular: S1 and S2 Present, no Murmur, Peripheral Pulses  Present Respiratory: Bilateral Air entry equal and Decreased,  Faint basal Crackles, Bilateral expiratory wheezes Abdomen: Bowel Sound present, Soft and no tenderness Skin: redness no, no Rash  Extremities: no Pedal edema, no calf tenderness Neurologic: Grossly no focal neuro deficit.Bilaterally Equal motor strength  Labs on Admission:  CBC:  Recent Labs Lab 07/12/15 2305  WBC 15.1*  NEUTROABS 10.0*  HGB 15.3  HCT 43.3  MCV 94.5  PLT [id number]   Basic Metabolic Panel:  Recent Labs Lab 07/12/15 2305  NA 137  K 3.5  CL 103  CO2 22  GLUCOSE 151*  BUN 10  CREATININE 0.95  CALCIUM 8.8*   GFR: Estimated Creatinine Clearance: 87.6 mL/min (by C-G formula based on Cr of 0.95). Liver Function Tests: No results for input(s): AST, ALT, ALKPHOS, BILITOT, PROT, ALBUMIN in the last 168 hours. No results for input(s): LIPASE, AMYLASE in the last 168 hours. No results for input(s): AMMONIA in the last 168 hours. Coagulation Profile: No results for input(s): INR, PROTIME in the last 168 hours. Cardiac Enzymes: No results for input(s): CKTOTAL, CKMB, CKMBINDEX, TROPONINI in the last 168 hours. BNP (last 3 results) No results for input(s): PROBNP in the last 8760 hours. HbA1C: No results for input(s): HGBA1C in the last 72 hours. CBG: No results for input(s): GLUCAP in the last 168 hours. Lipid Profile: No results for input(s): CHOL, HDL, LDLCALC, TRIG, CHOLHDL, LDLDIRECT in the last 72 hours. Thyroid Function Tests: No results for input(s): TSH, T4TOTAL, FREET4, T3FREE, THYROIDAB in the last 72 hours. Anemia Panel: No results for input(s): VITAMINB12, FOLATE, FERRITIN, TIBC, IRON, RETICCTPCT in the last 72 hours. Urine analysis: No results found for: COLORURINE, APPEARANCEUR, LABSPEC, PHURINE, GLUCOSEU, HGBUR, BILIRUBINUR, KETONESUR, PROTEINUR, UROBILINOGEN, NITRITE, LEUKOCYTESUR Sepsis Labs: @LABRCNTIP (procalcitonin:4,lacticidven:4) )No results found for this or any previous  visit (from the past 240 hour(s)).   Radiological Exams on Admission: Ct Angio Chest Pe W/cm &/or Wo Cm  07/13/2015  CLINICAL DATA:  Shortness of breath for 4 days. Cramping in the legs, arms, and sides. Chest pain and left shoulder pain with shortness of breath. Fatigue and leg swelling. Surgery to left shoulder 2 weeks ago. 7 hour car trip today. EXAM: CT ANGIOGRAPHY CHEST WITH CONTRAST TECHNIQUE: Multidetector CT imaging of the chest was performed using the standard protocol during bolus administration of intravenous contrast. Multiplanar CT image reconstructions and MIPs were obtained to evaluate the vascular anatomy. CONTRAST:  100 mL Isovue 370 COMPARISON:  None. FINDINGS: Technically adequate study with moderately good opacification of the central and segmental pulmonary arteries. No focal filling defects demonstrated. No evidence of significant pulmonary embolus. Normal heart size. Normal caliber thoracic aorta with scattered calcification. Great vessel origins are patent. No aortic dissection. Scattered lymph nodes in the mediastinum are not pathologically enlarged and are likely reactive. Esophagus is decompressed. There is focal airspace consolidation in the right middle lung most  consistent with pneumonia. Peripheral interstitial changes and emphysematous changes in the lungs. Bronchial wall thickening with nodular peribronchial ground-glass infiltrates in the lung bases suggesting bronchopneumonia. No pleural effusions. No pneumothorax. Included portions of the upper abdominal organs are grossly unremarkable. The degenerative changes in the spine. No destructive bone lesions. Review of the MIP images confirms the above findings. IMPRESSION: No evidence of significant pulmonary embolus. Right middle lobe pneumonia with patchy nodular infiltration in the lung bases suggesting bronchopneumonia. Peripheral interstitial fibrosis and emphysematous changes. Electronically Signed   By: Lucienne Capers M.D.    On: 07/13/2015 00:31   Assessment/Plan 1. Asthma exacerbation Right middle lobe healthcare associated pneumonia. The patient presents with, as of cough and shortness of breath. Patient has leukocytosis tachypnea. She mentions about having fever at home as well. CT scan of the chest is showing evidence of right middle lobe pneumonia Examination suggested bilateral expiratory wheezing. Next line with this the patient will be started on broad-spectrum antibiotics vancomycin and cefepime. Duo nebs will be added. Patient has received Solu-Medrol although I will stay Away from Systemic steroids given the patient has recently undergone surgery.We will use inhalational steroids The patient does not improve with above mentioned treatment and the steroids will be added. Check influenza check sputum culture check urine antigen as well as blood culture.  2.Recent left shoulder surgery. Pain management as per home management.   3.bPH. Continue Flomax.  4.Essential hypertension. Continue lisinopril.  5.Major depression. Chronic pain syndrome. Continue Cymbalta.  Nutrition: Regular diet DVT Prophylaxis: subcutaneous Heparin  Advance goals of care discussion: Full code   Consults: none  Family Communication: no family was present at bedside, at the time of interview.  Disposition: Admitted as Observation, telemetry unit.  Author: Berle Mull, MD [facility] Pager: [phone] 07/13/2015  If 7PM-7AM, please contact night-coverage [email or web address] Password [email or web address]

## 2015-07-13 NOTE — Progress Notes (Signed)
Transfer from [facility] per Dr. Claudine Mouton  65 year old man with past medical history of asthma, hypertension, GERD, tobacco abuse, depression, who presents with shortness of breath while he was traveling. Temperature normal. WBC 15.1, tachycardia, oxygen saturation to 91%, electrolytes and renal function okay. CT angiogram is negative for PE, but showed RML infiltration for pneumonia. Levaquin was started by EDP. Pt is accepted to tele as observation.  Ivor Costa, MD  [facility] Pager [phone]  If 7PM-7AM, please contact night-coverage [email or web address] Password [email or web address] 07/13/2015, 1:55 AM

## 2015-07-13 NOTE — Progress Notes (Signed)
Pharmacy Antibiotic Note  Frederick Guzman is a 65 y.o. male admitted on 07/12/2015 with pneumonia.  Pharmacy has been consulted for vancomycin dosing.  Plan: Vancomycin 1000 IV every 8 hours.  Goal trough 15-20 mcg/mL.  Height: 6\' 1"  (185.4 cm) Weight: 203 lb 4.8 oz (92.216 kg) IBW/kg (Calculated) : 79.9  Temp (24hrs), Avg:98.9 F (37.2 C), Min:98.1 F (36.7 C), Max:99.8 F (37.7 C)   Recent Labs Lab 07/12/15 2305  WBC 15.1*  CREATININE 0.95    Estimated Creatinine Clearance: 87.6 mL/min (by C-G formula based on Cr of 0.95).    Allergies  Allergen Reactions  . Methotrexate Derivatives Other (See Comments)  . Ultram [Tramadol Hcl]       Thank you for allowing pharmacy to be a part of this patient's care.  Wynona Neat, PharmD, BCPS  07/13/2015 6:43 AM

## 2015-07-13 NOTE — Discharge Summary (Signed)
Physician Discharge Summary  Frederick Guzman [id number] DOB: 1950-11-19 DOA: 07/12/2015  PCP: Martinique, JULIE M, NP  Admit date: 07/12/2015 Discharge date: 07/13/2015  Time spent: > 30 minutes  Recommendations for Outpatient Follow-up:  1. Follow up with PCP in 2 days as scheduled   Discharge Diagnoses:  Principal Problem:   Asthma exacerbation Active Problems:   TOBACCO ABUSE   GERD   MDD (major depressive disorder) (HCC)   Depression   HTN (hypertension)   CAP (community acquired pneumonia)  Discharge Condition: stable  Diet recommendation: regular  Filed Weights   07/13/15 0409  Weight: 92.216 kg (203 lb 4.8 oz)    History of present illness:  Per Dr. Posey Pronto 65 y.o. male with Past medical history of Asthma, hypertension, recent shoulder Surgery 10 days ago. The patient is presenting with complaints of cough and shortness of breath as well as difficulty breathing. This is been ongoing for last few days. He also complains of some fever. Denies having any chest pain. Denies having any leg swelling or leg tenderness. No abdominal pain no nausea no vomiting or diarrhea. He denies any recent change in his medications. Apparently the patient has been traveling home from [place] for 11 hours today. 10 days ago the patient has underwent surgery of the left shoulder  Hospital Course:  Patient was admitted to the hospital with community acquired pneumonia. He had recent surgery however this was done as outpatient and was not hospitalized and will treat as CAP rather than HCAP. He received Vancomycin and Cefepime initially, with significant clinical improvement, patient was feeling back to baseline, improved, able to ambulate in the hallway without dyspnea / lightheadedness or dizziness and without hypoxia. He will be discharged home in stable condition, will complete treatment with Levaquin, have a short prednisone taper. He has follow up tomorrow with his orthopedic MD and in 2  days with his primary MD. We discussed extensively regarding tobacco use and is willing to try nicotine patches, prescribed on d/c.  His other medical problems have been stable and no further changes were made to his home regimen.   Procedures:  None    Consultations:  None   Discharge Exam: Filed Vitals:   07/13/15 0409 07/13/15 0835 07/13/15 1407 07/13/15 1436  BP: 130/60  154/69   Pulse: 85  84   Temp: 98.1 F (36.7 C)  97.7 F (36.5 C)   TempSrc: Oral  Oral   Resp: 18  19   Height: 6\' 1"  (1.854 m)     Weight: 92.216 kg (203 lb 4.8 oz)     SpO2: 95% 96% 97% 97%    General: NAD Cardiovascular: RRR Respiratory: coarse breath sounds right lower lung field, minimal wheezing  Discharge Instructions Activity:  As tolerated   Get Medicines reviewed and adjusted: Please take all your medications with you for your next visit with your Primary MD  Please request your Primary MD to go over all hospital tests and procedure/radiological results at the follow up, please ask your Primary MD to get all Hospital records sent to his/her office.  If you experience worsening of your admission symptoms, develop shortness of breath, life threatening emergency, suicidal or homicidal thoughts you must seek medical attention immediately by calling 911 or calling your MD immediately if symptoms less severe.  You must read complete instructions/literature along with all the possible adverse reactions/side effects for all the Medicines you take and that have been prescribed to you. Take any new Medicines after  you have completely understood and accpet all the possible adverse reactions/side effects.   Do not drive when taking Pain medications.   Do not take more than prescribed Pain, Sleep and Anxiety Medications  Special Instructions: If you have smoked or chewed Tobacco in the last 2 yrs please stop smoking, stop any regular Alcohol and or any Recreational drug use.  Wear Seat belts  while driving.  Please note  You were cared for by a hospitalist during your hospital stay. Once you are discharged, your primary care physician will handle any further medical issues. Please note that NO REFILLS for any discharge medications will be authorized once you are discharged, as it is imperative that you return to your primary care physician (or establish a relationship with a primary care physician if you do not have one) for your aftercare needs so that they can reassess your need for medications and monitor your lab values.    Medication List    TAKE these medications        albuterol (2.5 MG/3ML) 0.083% nebulizer solution  Commonly known as:  PROVENTIL  Take 2.5 mg by nebulization every 6 (six) hours as needed for wheezing or shortness of breath.     PROAIR HFA 108 (90 Base) MCG/ACT inhaler  Generic drug:  albuterol  Inhale 2 puffs into the lungs every 6 (six) hours as needed for wheezing or shortness of breath.     aspirin EC 81 MG tablet  Take 81 mg by mouth at bedtime.     atorvastatin 40 MG tablet  Commonly known as:  LIPITOR  Take 40 mg by mouth at bedtime.     AXIRON 30 MG/ACT Soln  Generic drug:  Testosterone  Place 60 mg onto the skin daily. 1 pump under each arm     B-complex with vitamin C tablet  Take 1 tablet by mouth daily.     budesonide-formoterol 160-4.5 MCG/ACT inhaler  Commonly known as:  SYMBICORT  Inhale 2 puffs into the lungs 2 (two) times daily.     diclofenac 50 MG EC tablet  Commonly known as:  VOLTAREN  Take 50 mg by mouth 2 (two) times daily as needed (pain/ inflammation).     DULoxetine 60 MG capsule  Commonly known as:  CYMBALTA  Take 60 mg by mouth daily. Take with a 30 mg capsule for a 90 mg dose     esomeprazole 40 MG capsule  Commonly known as:  NEXIUM  Take 40 mg by mouth daily.     Fish Oil 1000 MG Caps  Take 1,000 mg by mouth daily.     guaiFENesin-dextromethorphan 100-10 MG/5ML syrup  Commonly known as:  ROBITUSSIN  DM  Take 5 mLs by mouth every 4 (four) hours as needed for cough.     ipratropium-albuterol 0.5-2.5 (3) MG/3ML Soln  Commonly known as:  DUONEB  Take 3 mLs by nebulization every 6 (six) hours as needed.     levofloxacin 750 MG tablet  Commonly known as:  LEVAQUIN  Take 1 tablet (750 mg total) by mouth daily.     lisinopril 5 MG tablet  Commonly known as:  PRINIVIL,ZESTRIL  Take 5 mg by mouth at bedtime.     methocarbamol 500 MG tablet  Commonly known as:  ROBAXIN  Take 500 mg by mouth every 4 (four) hours as needed for muscle spasms.     multivitamin with minerals Tabs tablet  Take 1 tablet by mouth at bedtime.     nicotine  21 mg/24hr patch  Commonly known as:  EQ NICOTINE  Place 1 patch (21 mg total) onto the skin daily.     oxyCODONE-acetaminophen 5-325 MG tablet  Commonly known as:  PERCOCET/ROXICET  Take 1-2 tablets by mouth every 4 (four) hours as needed for severe pain.     predniSONE 20 MG tablet  Commonly known as:  DELTASONE  Take 2 tablets (40 mg total) by mouth daily with breakfast. For 5 days     tamsulosin 0.4 MG Caps capsule  Commonly known as:  FLOMAX  Take 0.4 mg by mouth 2 (two) times daily.     vitamin C 500 MG tablet  Commonly known as:  ASCORBIC ACID  Take 500 mg by mouth daily.           Follow-up Information    Follow up with Martinique, Cleta Alberts, NP In 2 days.   Specialty:  Nurse Practitioner   Contact information:   [address] [place] [address] [phone]       The results of significant diagnostics from this hospitalization (including imaging, microbiology, ancillary and laboratory) are listed below for reference.    Significant Diagnostic Studies: Dg Eye Foreign Body  06/16/2015  CLINICAL DATA:  Metal working/exposure; clearance prior to MRI. 65 year old male EXAM: [place] - 2 VIEW COMPARISON:  None. FINDINGS: There is no evidence of metallic foreign body within the orbits. No significant bone  abnormality identified. IMPRESSION: No evidence of metallic foreign body within the orbits. Electronically Signed   By: Genevie Ann M.D.   On: 06/16/2015 09:24   Ct Angio Chest Pe W/cm &/or Wo Cm  07/13/2015  CLINICAL DATA:  Shortness of breath for 4 days. Cramping in the legs, arms, and sides. Chest pain and left shoulder pain with shortness of breath. Fatigue and leg swelling. Surgery to left shoulder 2 weeks ago. 7 hour car trip today. EXAM: CT ANGIOGRAPHY CHEST WITH CONTRAST TECHNIQUE: Multidetector CT imaging of the chest was performed using the standard protocol during bolus administration of intravenous contrast. Multiplanar CT image reconstructions and MIPs were obtained to evaluate the vascular anatomy. CONTRAST:  100 mL Isovue 370 COMPARISON:  None. FINDINGS: Technically adequate study with moderately good opacification of the central and segmental pulmonary arteries. No focal filling defects demonstrated. No evidence of significant pulmonary embolus. Normal heart size. Normal caliber thoracic aorta with scattered calcification. Great vessel origins are patent. No aortic dissection. Scattered lymph nodes in the mediastinum are not pathologically enlarged and are likely reactive. Esophagus is decompressed. There is focal airspace consolidation in the right middle lung most consistent with pneumonia. Peripheral interstitial changes and emphysematous changes in the lungs. Bronchial wall thickening with nodular peribronchial ground-glass infiltrates in the lung bases suggesting bronchopneumonia. No pleural effusions. No pneumothorax. Included portions of the upper abdominal organs are grossly unremarkable. The degenerative changes in the spine. No destructive bone lesions. Review of the MIP images confirms the above findings. IMPRESSION: No evidence of significant pulmonary embolus. Right middle lobe pneumonia with patchy nodular infiltration in the lung bases suggesting bronchopneumonia. Peripheral  interstitial fibrosis and emphysematous changes. Electronically Signed   By: Lucienne Capers M.D.   On: 07/13/2015 00:31    Microbiology: Recent Results (from the past 240 hour(s))  Culture, blood (Routine X 2) w Reflex to ID Panel     Status: None (Preliminary result)   Collection Time: 07/13/15 12:45 AM  Result Value Ref Range Status   Specimen Description BLOOD  RIGHT HAND  Final   Special Requests   Final    BOTTLES DRAWN AEROBIC AND ANAEROBIC 10 CC Performed at [facility]    Culture PENDING  Incomplete   Report Status PENDING  Incomplete  Culture, blood (Routine X 2) w Reflex to ID Panel     Status: None (Preliminary result)   Collection Time: 07/13/15 12:50 AM  Result Value Ref Range Status   Specimen Description BLOOD RIGHT ANTECUBITAL  Final   Special Requests   Final    BOTTLES DRAWN AEROBIC AND ANAEROBIC 10 CC Performed at [facility]    Culture PENDING  Incomplete   Report Status PENDING  Incomplete     Labs: Basic Metabolic Panel:  Recent Labs Lab 07/12/15 2305 07/13/15 1104  NA 137 141  K 3.5 4.3  CL 103 105  CO2 22 26  GLUCOSE 151* 235*  BUN 10 10  CREATININE 0.95 1.04  CALCIUM 8.8* 9.1  MG  --  2.2   Liver Function Tests:  Recent Labs Lab 07/13/15 1104  AST 26  ALT 26  ALKPHOS 81  BILITOT 0.4  PROT 7.1  ALBUMIN 2.9*   CBC:  Recent Labs Lab 07/12/15 2305 07/13/15 1104  WBC 15.1* 9.7  NEUTROABS 10.0* 8.3*  HGB 15.3 14.9  HCT 43.3 42.1  MCV 94.5 95.2  PLT 287 259   Signed:  GHERGHE, COSTIN  [facility] 07/13/2015, 3:09 PM

## 2015-07-13 NOTE — ED Notes (Signed)
Dr. Oni into room. 

## 2015-07-13 NOTE — Discharge Instructions (Signed)
Follow with Martinique, JULIE M, NP in 5-7 days  Please get a complete blood count and chemistry panel checked by your Primary MD at your next visit, and again as instructed by your Primary MD. Please get your medications reviewed and adjusted by your Primary MD.  Please request your Primary MD to go over all Hospital Tests and Procedure/Radiological results at the follow up, please get all Hospital records sent to your Prim MD by signing hospital release before you go home.  If you had Pneumonia of Lung problems at the Hospital: Please get a 2 view Chest X ray done in 6-8 weeks after hospital discharge or sooner if instructed by your Primary MD.  If you have Congestive Heart Failure: Please call your Cardiologist or Primary MD anytime you have any of the following symptoms:  1) 3 pound weight gain in 24 hours or 5 pounds in 1 week  2) shortness of breath, with or without a dry hacking cough  3) swelling in the hands, feet or stomach  4) if you have to sleep on extra pillows at night in order to breathe  Follow cardiac low salt diet and 1.5 lit/day fluid restriction.  If you have diabetes Accuchecks 4 times/day, Once in AM empty stomach and then before each meal. Log in all results and show them to your primary doctor at your next visit. If any glucose reading is under 80 or above 300 call your primary MD immediately.  If you have Seizure/Convulsions/Epilepsy: Please do not drive, operate heavy machinery, participate in activities at heights or participate in high speed sports until you have seen by Primary MD or a Neurologist and advised to do so again.  If you had Gastrointestinal Bleeding: Please ask your Primary MD to check a complete blood count within one week of discharge or at your next visit. Your endoscopic/colonoscopic biopsies that are pending at the time of discharge, will also need to followed by your Primary MD.  Get Medicines reviewed and adjusted. Please take all your  medications with you for your next visit with your Primary MD  Please request your Primary MD to go over all hospital tests and procedure/radiological results at the follow up, please ask your Primary MD to get all Hospital records sent to his/her office.  If you experience worsening of your admission symptoms, develop shortness of breath, life threatening emergency, suicidal or homicidal thoughts you must seek medical attention immediately by calling 911 or calling your MD immediately  if symptoms less severe.  You must read complete instructions/literature along with all the possible adverse reactions/side effects for all the Medicines you take and that have been prescribed to you. Take any new Medicines after you have completely understood and accpet all the possible adverse reactions/side effects.   Do not drive or operate heavy machinery when taking Pain medications.   Do not take more than prescribed Pain, Sleep and Anxiety Medications  Special Instructions: If you have smoked or chewed Tobacco  in the last 2 yrs please stop smoking, stop any regular Alcohol  and or any Recreational drug use.  Wear Seat belts while driving.  Please note You were cared for by a hospitalist during your hospital stay. If you have any questions about your discharge medications or the care you received while you were in the hospital after you are discharged, you can call the unit and asked to speak with the hospitalist on call if the hospitalist that took care of you is not available.  Once you are discharged, your primary care physician will handle any further medical issues. Please note that NO REFILLS for any discharge medications will be authorized once you are discharged, as it is imperative that you return to your primary care physician (or establish a relationship with a primary care physician if you do not have one) for your aftercare needs so that they can reassess your need for medications and monitor your  lab values.  You can reach the hospitalist office at phone [phone] or fax [phone]   If you do not have a primary care physician, you can call [phone] for a physician referral.  Activity: As tolerated with Full fall precautions use walker/cane & assistance as needed  Diet: regular  Disposition Home

## 2015-07-14 DIAGNOSIS — Z4789 Encounter for other orthopedic aftercare: Secondary | ICD-10-CM | POA: Diagnosis not present

## 2015-07-14 DIAGNOSIS — S43432D Superior glenoid labrum lesion of left shoulder, subsequent encounter: Secondary | ICD-10-CM | POA: Diagnosis not present

## 2015-07-14 DIAGNOSIS — M75102 Unspecified rotator cuff tear or rupture of left shoulder, not specified as traumatic: Secondary | ICD-10-CM | POA: Diagnosis not present

## 2015-07-14 LAB — LEGIONELLA PNEUMOPHILA TOTAL AB

## 2015-07-14 LAB — HIV ANTIBODY (ROUTINE TESTING W REFLEX): HIV SCREEN 4TH GENERATION: NONREACTIVE

## 2015-07-15 DIAGNOSIS — J449 Chronic obstructive pulmonary disease, unspecified: Secondary | ICD-10-CM | POA: Diagnosis not present

## 2015-07-15 DIAGNOSIS — L821 Other seborrheic keratosis: Secondary | ICD-10-CM | POA: Diagnosis not present

## 2015-07-15 DIAGNOSIS — D2371 Other benign neoplasm of skin of right lower limb, including hip: Secondary | ICD-10-CM | POA: Diagnosis not present

## 2015-07-15 DIAGNOSIS — E119 Type 2 diabetes mellitus without complications: Secondary | ICD-10-CM | POA: Diagnosis not present

## 2015-07-15 DIAGNOSIS — Z85820 Personal history of malignant melanoma of skin: Secondary | ICD-10-CM | POA: Diagnosis not present

## 2015-07-15 DIAGNOSIS — E782 Mixed hyperlipidemia: Secondary | ICD-10-CM | POA: Diagnosis not present

## 2015-07-15 DIAGNOSIS — Z09 Encounter for follow-up examination after completed treatment for conditions other than malignant neoplasm: Secondary | ICD-10-CM | POA: Diagnosis not present

## 2015-07-15 DIAGNOSIS — J189 Pneumonia, unspecified organism: Secondary | ICD-10-CM | POA: Diagnosis not present

## 2015-07-15 DIAGNOSIS — D225 Melanocytic nevi of trunk: Secondary | ICD-10-CM | POA: Diagnosis not present

## 2015-07-15 DIAGNOSIS — D1801 Hemangioma of skin and subcutaneous tissue: Secondary | ICD-10-CM | POA: Diagnosis not present

## 2015-07-15 DIAGNOSIS — D2261 Melanocytic nevi of right upper limb, including shoulder: Secondary | ICD-10-CM | POA: Diagnosis not present

## 2015-07-15 DIAGNOSIS — D2262 Melanocytic nevi of left upper limb, including shoulder: Secondary | ICD-10-CM | POA: Diagnosis not present

## 2015-07-15 DIAGNOSIS — D224 Melanocytic nevi of scalp and neck: Secondary | ICD-10-CM | POA: Diagnosis not present

## 2015-07-15 DIAGNOSIS — L570 Actinic keratosis: Secondary | ICD-10-CM | POA: Diagnosis not present

## 2015-07-15 DIAGNOSIS — E291 Testicular hypofunction: Secondary | ICD-10-CM | POA: Diagnosis not present

## 2015-07-17 DIAGNOSIS — M75102 Unspecified rotator cuff tear or rupture of left shoulder, not specified as traumatic: Secondary | ICD-10-CM | POA: Diagnosis not present

## 2015-07-18 LAB — CULTURE, BLOOD (ROUTINE X 2)
CULTURE: NO GROWTH
CULTURE: NO GROWTH
Culture: NO GROWTH

## 2015-07-23 DIAGNOSIS — M75102 Unspecified rotator cuff tear or rupture of left shoulder, not specified as traumatic: Secondary | ICD-10-CM | POA: Diagnosis not present

## 2015-07-25 DIAGNOSIS — M75102 Unspecified rotator cuff tear or rupture of left shoulder, not specified as traumatic: Secondary | ICD-10-CM | POA: Diagnosis not present

## 2015-07-28 DIAGNOSIS — M75102 Unspecified rotator cuff tear or rupture of left shoulder, not specified as traumatic: Secondary | ICD-10-CM | POA: Diagnosis not present

## 2015-07-30 DIAGNOSIS — M75102 Unspecified rotator cuff tear or rupture of left shoulder, not specified as traumatic: Secondary | ICD-10-CM | POA: Diagnosis not present

## 2015-08-04 DIAGNOSIS — M75102 Unspecified rotator cuff tear or rupture of left shoulder, not specified as traumatic: Secondary | ICD-10-CM | POA: Diagnosis not present

## 2015-08-07 DIAGNOSIS — M75102 Unspecified rotator cuff tear or rupture of left shoulder, not specified as traumatic: Secondary | ICD-10-CM | POA: Diagnosis not present

## 2015-08-11 DIAGNOSIS — M75102 Unspecified rotator cuff tear or rupture of left shoulder, not specified as traumatic: Secondary | ICD-10-CM | POA: Diagnosis not present

## 2015-08-13 DIAGNOSIS — M75102 Unspecified rotator cuff tear or rupture of left shoulder, not specified as traumatic: Secondary | ICD-10-CM | POA: Diagnosis not present

## 2015-08-13 DIAGNOSIS — M19012 Primary osteoarthritis, left shoulder: Secondary | ICD-10-CM | POA: Diagnosis not present

## 2015-08-13 DIAGNOSIS — S43432D Superior glenoid labrum lesion of left shoulder, subsequent encounter: Secondary | ICD-10-CM | POA: Diagnosis not present

## 2015-08-13 DIAGNOSIS — S472XXD Crushing injury of left shoulder and upper arm, subsequent encounter: Secondary | ICD-10-CM | POA: Diagnosis not present

## 2015-08-20 DIAGNOSIS — M75102 Unspecified rotator cuff tear or rupture of left shoulder, not specified as traumatic: Secondary | ICD-10-CM | POA: Diagnosis not present

## 2015-08-20 DIAGNOSIS — S472XXD Crushing injury of left shoulder and upper arm, subsequent encounter: Secondary | ICD-10-CM | POA: Diagnosis not present

## 2015-08-20 DIAGNOSIS — S43432D Superior glenoid labrum lesion of left shoulder, subsequent encounter: Secondary | ICD-10-CM | POA: Diagnosis not present

## 2015-08-20 DIAGNOSIS — M19012 Primary osteoarthritis, left shoulder: Secondary | ICD-10-CM | POA: Diagnosis not present

## 2015-08-22 DIAGNOSIS — M75102 Unspecified rotator cuff tear or rupture of left shoulder, not specified as traumatic: Secondary | ICD-10-CM | POA: Diagnosis not present

## 2015-08-25 DIAGNOSIS — M75102 Unspecified rotator cuff tear or rupture of left shoulder, not specified as traumatic: Secondary | ICD-10-CM | POA: Diagnosis not present

## 2015-08-27 DIAGNOSIS — M75102 Unspecified rotator cuff tear or rupture of left shoulder, not specified as traumatic: Secondary | ICD-10-CM | POA: Diagnosis not present

## 2015-08-29 DIAGNOSIS — J189 Pneumonia, unspecified organism: Secondary | ICD-10-CM | POA: Diagnosis not present

## 2015-09-01 DIAGNOSIS — M75102 Unspecified rotator cuff tear or rupture of left shoulder, not specified as traumatic: Secondary | ICD-10-CM | POA: Diagnosis not present

## 2015-09-03 DIAGNOSIS — M75102 Unspecified rotator cuff tear or rupture of left shoulder, not specified as traumatic: Secondary | ICD-10-CM | POA: Diagnosis not present

## 2015-09-08 DIAGNOSIS — M19012 Primary osteoarthritis, left shoulder: Secondary | ICD-10-CM | POA: Diagnosis not present

## 2015-09-15 DIAGNOSIS — M75102 Unspecified rotator cuff tear or rupture of left shoulder, not specified as traumatic: Secondary | ICD-10-CM | POA: Diagnosis not present

## 2015-09-17 DIAGNOSIS — M75102 Unspecified rotator cuff tear or rupture of left shoulder, not specified as traumatic: Secondary | ICD-10-CM | POA: Diagnosis not present

## 2015-09-22 DIAGNOSIS — M75102 Unspecified rotator cuff tear or rupture of left shoulder, not specified as traumatic: Secondary | ICD-10-CM | POA: Diagnosis not present

## 2015-09-24 DIAGNOSIS — M75102 Unspecified rotator cuff tear or rupture of left shoulder, not specified as traumatic: Secondary | ICD-10-CM | POA: Diagnosis not present

## 2015-09-29 DIAGNOSIS — M75102 Unspecified rotator cuff tear or rupture of left shoulder, not specified as traumatic: Secondary | ICD-10-CM | POA: Diagnosis not present

## 2015-10-01 DIAGNOSIS — F329 Major depressive disorder, single episode, unspecified: Secondary | ICD-10-CM | POA: Diagnosis not present

## 2015-10-01 DIAGNOSIS — I10 Essential (primary) hypertension: Secondary | ICD-10-CM | POA: Diagnosis not present

## 2015-10-01 DIAGNOSIS — J449 Chronic obstructive pulmonary disease, unspecified: Secondary | ICD-10-CM | POA: Diagnosis not present

## 2015-10-01 DIAGNOSIS — G4733 Obstructive sleep apnea (adult) (pediatric): Secondary | ICD-10-CM | POA: Diagnosis not present

## 2015-10-06 DIAGNOSIS — Z4789 Encounter for other orthopedic aftercare: Secondary | ICD-10-CM | POA: Diagnosis not present

## 2015-10-07 DIAGNOSIS — E119 Type 2 diabetes mellitus without complications: Secondary | ICD-10-CM | POA: Diagnosis not present

## 2015-10-07 DIAGNOSIS — E291 Testicular hypofunction: Secondary | ICD-10-CM | POA: Diagnosis not present

## 2015-10-07 DIAGNOSIS — E782 Mixed hyperlipidemia: Secondary | ICD-10-CM | POA: Diagnosis not present

## 2015-10-14 DIAGNOSIS — F17210 Nicotine dependence, cigarettes, uncomplicated: Secondary | ICD-10-CM | POA: Diagnosis not present

## 2015-10-14 DIAGNOSIS — J449 Chronic obstructive pulmonary disease, unspecified: Secondary | ICD-10-CM | POA: Diagnosis not present

## 2015-10-14 DIAGNOSIS — R05 Cough: Secondary | ICD-10-CM | POA: Diagnosis not present

## 2015-11-05 DIAGNOSIS — Z4789 Encounter for other orthopedic aftercare: Secondary | ICD-10-CM | POA: Diagnosis not present

## 2015-11-12 DIAGNOSIS — E291 Testicular hypofunction: Secondary | ICD-10-CM | POA: Diagnosis not present

## 2015-11-12 DIAGNOSIS — R351 Nocturia: Secondary | ICD-10-CM | POA: Diagnosis not present

## 2015-11-12 DIAGNOSIS — Z8042 Family history of malignant neoplasm of prostate: Secondary | ICD-10-CM | POA: Diagnosis not present

## 2015-11-12 DIAGNOSIS — N400 Enlarged prostate without lower urinary tract symptoms: Secondary | ICD-10-CM | POA: Diagnosis not present

## 2015-11-12 DIAGNOSIS — Z800 Family history of malignant neoplasm of digestive organs: Secondary | ICD-10-CM | POA: Diagnosis not present

## 2015-11-12 DIAGNOSIS — F17210 Nicotine dependence, cigarettes, uncomplicated: Secondary | ICD-10-CM | POA: Diagnosis not present

## 2015-11-27 DIAGNOSIS — Z9989 Dependence on other enabling machines and devices: Secondary | ICD-10-CM | POA: Diagnosis not present

## 2015-11-27 DIAGNOSIS — G4733 Obstructive sleep apnea (adult) (pediatric): Secondary | ICD-10-CM | POA: Diagnosis not present

## 2015-11-27 DIAGNOSIS — F17210 Nicotine dependence, cigarettes, uncomplicated: Secondary | ICD-10-CM | POA: Diagnosis not present

## 2015-11-27 DIAGNOSIS — Z23 Encounter for immunization: Secondary | ICD-10-CM | POA: Diagnosis not present

## 2015-11-27 DIAGNOSIS — J449 Chronic obstructive pulmonary disease, unspecified: Secondary | ICD-10-CM | POA: Diagnosis not present

## 2015-12-03 DIAGNOSIS — Z4789 Encounter for other orthopedic aftercare: Secondary | ICD-10-CM | POA: Diagnosis not present

## 2015-12-30 DIAGNOSIS — J301 Allergic rhinitis due to pollen: Secondary | ICD-10-CM | POA: Diagnosis not present

## 2015-12-30 DIAGNOSIS — I10 Essential (primary) hypertension: Secondary | ICD-10-CM | POA: Diagnosis not present

## 2015-12-30 DIAGNOSIS — E782 Mixed hyperlipidemia: Secondary | ICD-10-CM | POA: Diagnosis not present

## 2015-12-30 DIAGNOSIS — G4733 Obstructive sleep apnea (adult) (pediatric): Secondary | ICD-10-CM | POA: Diagnosis not present

## 2015-12-30 DIAGNOSIS — R7301 Impaired fasting glucose: Secondary | ICD-10-CM | POA: Diagnosis not present

## 2015-12-30 DIAGNOSIS — F329 Major depressive disorder, single episode, unspecified: Secondary | ICD-10-CM | POA: Diagnosis not present

## 2015-12-30 DIAGNOSIS — J449 Chronic obstructive pulmonary disease, unspecified: Secondary | ICD-10-CM | POA: Diagnosis not present

## 2015-12-31 DIAGNOSIS — G4710 Hypersomnia, unspecified: Secondary | ICD-10-CM | POA: Diagnosis not present

## 2016-01-02 DIAGNOSIS — E782 Mixed hyperlipidemia: Secondary | ICD-10-CM | POA: Diagnosis not present

## 2016-01-02 DIAGNOSIS — E349 Endocrine disorder, unspecified: Secondary | ICD-10-CM | POA: Diagnosis not present

## 2016-01-02 DIAGNOSIS — R7301 Impaired fasting glucose: Secondary | ICD-10-CM | POA: Diagnosis not present

## 2016-01-06 DIAGNOSIS — L821 Other seborrheic keratosis: Secondary | ICD-10-CM | POA: Diagnosis not present

## 2016-01-06 DIAGNOSIS — L905 Scar conditions and fibrosis of skin: Secondary | ICD-10-CM | POA: Diagnosis not present

## 2016-01-06 DIAGNOSIS — D1801 Hemangioma of skin and subcutaneous tissue: Secondary | ICD-10-CM | POA: Diagnosis not present

## 2016-01-06 DIAGNOSIS — D485 Neoplasm of uncertain behavior of skin: Secondary | ICD-10-CM | POA: Diagnosis not present

## 2016-01-06 DIAGNOSIS — Z85820 Personal history of malignant melanoma of skin: Secondary | ICD-10-CM | POA: Diagnosis not present

## 2016-01-06 DIAGNOSIS — L218 Other seborrheic dermatitis: Secondary | ICD-10-CM | POA: Diagnosis not present

## 2016-01-07 DIAGNOSIS — G4733 Obstructive sleep apnea (adult) (pediatric): Secondary | ICD-10-CM | POA: Diagnosis not present

## 2016-01-08 DIAGNOSIS — M12812 Other specific arthropathies, not elsewhere classified, left shoulder: Secondary | ICD-10-CM | POA: Diagnosis not present

## 2016-01-12 DIAGNOSIS — E291 Testicular hypofunction: Secondary | ICD-10-CM | POA: Diagnosis not present

## 2016-01-15 DIAGNOSIS — Z01818 Encounter for other preprocedural examination: Secondary | ICD-10-CM | POA: Diagnosis not present

## 2016-01-15 DIAGNOSIS — I445 Left posterior fascicular block: Secondary | ICD-10-CM | POA: Diagnosis not present

## 2016-01-27 DIAGNOSIS — R0602 Shortness of breath: Secondary | ICD-10-CM | POA: Diagnosis not present

## 2016-01-27 DIAGNOSIS — J449 Chronic obstructive pulmonary disease, unspecified: Secondary | ICD-10-CM | POA: Diagnosis not present

## 2016-01-27 DIAGNOSIS — F17210 Nicotine dependence, cigarettes, uncomplicated: Secondary | ICD-10-CM | POA: Diagnosis not present

## 2016-01-27 DIAGNOSIS — G4733 Obstructive sleep apnea (adult) (pediatric): Secondary | ICD-10-CM | POA: Diagnosis not present

## 2016-01-27 DIAGNOSIS — I10 Essential (primary) hypertension: Secondary | ICD-10-CM | POA: Diagnosis not present

## 2016-01-27 NOTE — H&P (Signed)
Frederick Guzman is an 65 y.o. male.    Chief Complaint: left shoulder pain  HPI: Pt is a 65 y.o. male complaining of left shoulder pain for multiple years. Pain had continually increased since the beginning. X-rays in the clinic show end-stage arthritic changes of the left shoulder. Pt has tried various conservative treatments which have failed to alleviate their symptoms, including injections and therapy. Various options are discussed with the patient. Risks, benefits and expectations were discussed with the patient. Patient understand the risks, benefits and expectations and wishes to proceed with surgery.   PCP:  Martinique, JULIE M, NP  D/C Plans: Home  PMH: Past Medical History:  Diagnosis Date  . Asthma   . Depression   . HTN (hypertension)     PSH: Past Surgical History:  Procedure Laterality Date  . SHOULDER SURGERY      Social History:  reports that he has been smoking.  He does not have any smokeless tobacco history on file. He reports that he does not drink alcohol or use drugs.  Allergies:  Allergies  Allergen Reactions  . Methotrexate Derivatives Nausea And Vomiting and Other (See Comments)    Mood change  . Ultram [Tramadol Hcl] Nausea And Vomiting    Medications: No current facility-administered medications for this encounter.    Current Outpatient Prescriptions  Medication Sig Dispense Refill  . albuterol (PROAIR HFA) 108 (90 Base) MCG/ACT inhaler Inhale 2 puffs into the lungs every 6 (six) hours as needed for wheezing or shortness of breath.    Marland Kitchen albuterol (PROVENTIL) (2.5 MG/3ML) 0.083% nebulizer solution Take 2.5 mg by nebulization every 6 (six) hours as needed for wheezing or shortness of breath.    Marland Kitchen aspirin EC 81 MG tablet Take 81 mg by mouth at bedtime.    Marland Kitchen atorvastatin (LIPITOR) 20 MG tablet Take 20 mg by mouth at bedtime.  0  . B Complex-C (B-COMPLEX WITH VITAMIN C) tablet Take 1 tablet by mouth daily.    . budesonide-formoterol (SYMBICORT)  160-4.5 MCG/ACT inhaler Inhale 2 puffs into the lungs 2 (two) times daily.    . diclofenac (VOLTAREN) 50 MG EC tablet Take 50 mg by mouth 2 (two) times daily as needed (pain/ inflammation).    . DULoxetine (CYMBALTA) 60 MG capsule Take 60 mg by mouth daily. Take with a 30 mg capsule for a 90 mg dose    . esomeprazole (NEXIUM) 40 MG capsule Take 40 mg by mouth daily.    Marland Kitchen guaiFENesin-dextromethorphan (ROBITUSSIN DM) 100-10 MG/5ML syrup Take 5 mLs by mouth every 4 (four) hours as needed for cough. 118 mL 0  . ipratropium-albuterol (DUONEB) 0.5-2.5 (3) MG/3ML SOLN Take 3 mLs by nebulization every 6 (six) hours as needed. 360 mL 1  . levofloxacin (LEVAQUIN) 750 MG tablet Take 1 tablet (750 mg total) by mouth daily. 5 tablet 0  . lisinopril (PRINIVIL,ZESTRIL) 5 MG tablet Take 5 mg by mouth at bedtime.     . methocarbamol (ROBAXIN) 500 MG tablet Take 500 mg by mouth every 4 (four) hours as needed for muscle spasms.    . Multiple Vitamin (MULTIVITAMIN WITH MINERALS) TABS tablet Take 1 tablet by mouth at bedtime.    . nicotine (EQ NICOTINE) 21 mg/24hr patch Place 1 patch (21 mg total) onto the skin daily. 28 patch 0  . Omega-3 Fatty Acids (FISH OIL) 1000 MG CAPS Take 1,000 mg by mouth daily.    Marland Kitchen oxyCODONE-acetaminophen (PERCOCET/ROXICET) 5-325 MG tablet Take 1-2 tablets by mouth every  4 (four) hours as needed for severe pain.     . predniSONE (DELTASONE) 20 MG tablet Take 2 tablets (40 mg total) by mouth daily with breakfast. For 5 days 10 tablet 0  . Tamsulosin HCl (FLOMAX) 0.4 MG CAPS Take 0.4 mg by mouth 2 (two) times daily.     . Testosterone (AXIRON) 30 MG/ACT SOLN Place 60 mg onto the skin daily. 1 pump under each arm    . vitamin C (ASCORBIC ACID) 500 MG tablet Take 500 mg by mouth daily.      No results found for this or any previous visit (from the past 48 hour(s)). No results found.  ROS: Pain with rom of the left upper extremity  Physical Exam:  Alert and oriented 65 y.o. male in no  acute distress Cranial nerves 2-12 intact Cervical spine: full rom with no tenderness, nv intact distally Chest: active breath sounds bilaterally, no wheeze rhonchi or rales Heart: regular rate and rhythm, no murmur Abd: non tender non distended with active bowel sounds Hip is stable with rom  Left upper extremity with moderate limitation with rom due to pain Crepitus with rom nv intact distally Strength limited with ER and IR  Assessment/Plan Assessment: left shoulder rotator cuff arthropathy  Plan: Patient will undergo a left reverse total shoulder by Dr. Veverly Fells at [facility]. Risks benefits and expectations were discussed with the patient. Patient understand risks, benefits and expectations and wishes to proceed.

## 2016-01-28 NOTE — Pre-Procedure Instructions (Signed)
EDIZ NEYLON  01/28/2016      [facility], [place] [address] Phone: [phone] Fax: [phone]    Your procedure is scheduled on Fri, Nov 10 @ 7:30 AM  Report to [facility] Admitting at 5:30 AM  Call this number if you have problems the morning of surgery:  [phone]   Remember:  Do not eat food or drink liquids after midnight.  Take these medicines the morning of surgery with A SIP OF WATER Albuterol<Bring Your Inhaler With You>,Symbicort,Duloxetine(Cymbalta),Nexium(Esomeprazole),Flonase(Fluticasone),Pain Pill(if needed),and Flomax(Tamsulosin)             No Goody's,BC's,Aleve,Advil,Motrin,Ibuprofen,Fish Oil,or any Herbal Medications.    Do not wear jewelry.  Do not wear lotions, powders,colognes, or deoderant.             Men may shave face and neck.  Do not bring valuables to the hospital.  [facility] is not responsible for any belongings or valuables.  Contacts, dentures or bridgework may not be worn into surgery.  Leave your suitcase in the car.  After surgery it may be brought to your room.  For patients admitted to the hospital, discharge time will be determined by your treatment team.  Patients discharged the day of surgery will not be allowed to drive home.   [facility]  Before surgery, you can play an important role.  Because skin is not sterile, your skin needs to be as free of germs as possible.  You can reduce the number of germs on you skin by washing with CHG (chlorahexidine gluconate) soap before surgery.  CHG is an antiseptic cleaner which kills germs and bonds with the skin to continue killing germs even after washing.  Please DO NOT use if you have an allergy to CHG or antibacterial soaps.  If your skin becomes reddened/irritated stop using the CHG and inform your nurse when you arrive at Short  Stay.  Do not shave (including legs and underarms) for at least 48 hours prior to the first CHG shower.  You may shave your face.  Please follow these instructions carefully:   1.  Shower with CHG Soap the night before surgery and the                                morning of Surgery.  2.  If you choose to wash your hair, wash your hair first as usual with your       normal shampoo.  3.  After you shampoo, rinse your hair and body thoroughly to remove the                      Shampoo.  4.  Use CHG as you would any other liquid soap.  You can apply chg directly       to the skin and wash gently with scrungie or a clean washcloth.  5.  Apply the CHG Soap to your body ONLY FROM THE NECK DOWN.        Do not use on open wounds or open sores.  Avoid contact with your eyes,       ears, mouth and genitals (private parts).  Wash genitals (private parts)       with your normal soap.  6.  Wash thoroughly, paying special attention to  the area where your surgery        will be performed.  7.  Thoroughly rinse your body with warm water from the neck down.  8.  DO NOT shower/wash with your normal soap after using and rinsing off       the CHG Soap.  9.  Pat yourself dry with a clean towel.            10.  Wear clean pajamas.            11.  Place clean sheets on your bed the night of your first shower and do not        sleep with pets.  Day of Surgery  Do not apply any lotions/deoderants the morning of surgery.  Please wear clean clothes to the hospital/surgery center.    Please read over the following fact sheets that you were given. Pain Booklet, Coughing and Deep Breathing, MRSA Information and Surgical Site Infection Prevention

## 2016-01-29 ENCOUNTER — Encounter (HOSPITAL_COMMUNITY): Payer: Self-pay

## 2016-01-29 ENCOUNTER — Encounter (HOSPITAL_COMMUNITY)
Admission: RE | Admit: 2016-01-29 | Discharge: 2016-01-29 | Disposition: A | Discharge status: Home / Self Care | Payer: Medicare Other | Source: Ambulatory Visit | Attending: Orthopedic Surgery | Admitting: Orthopedic Surgery

## 2016-01-29 LAB — CBC
HEMATOCRIT: 48.8 % (ref 39.0–52.0)
Hemoglobin: 17.4 g/dL — ABNORMAL HIGH (ref 13.0–17.0)
MCH: 32.3 pg (ref 26.0–34.0)
MCHC: 35.7 g/dL (ref 30.0–36.0)
MCV: 90.7 fL (ref 78.0–100.0)
Platelets: 212 10*3/uL (ref 150–400)
RBC: 5.38 MIL/uL (ref 4.22–5.81)
RDW: 13 % (ref 11.5–15.5)
WBC: 11.6 10*3/uL — AB (ref 4.0–10.5)

## 2016-01-29 LAB — BASIC METABOLIC PANEL
ANION GAP: 9 (ref 5–15)
BUN: 6 mg/dL (ref 6–20)
CALCIUM: 9.2 mg/dL (ref 8.9–10.3)
CO2: 28 mmol/L (ref 22–32)
Chloride: 103 mmol/L (ref 101–111)
Creatinine, Ser: 1.01 mg/dL (ref 0.61–1.24)
GFR calc Af Amer: >60 mL/min (ref >60)
GFR calc non Af Amer: >60 mL/min (ref >60)
GLUCOSE: 122 mg/dL — AB (ref 65–99)
Potassium: 4 mmol/L (ref 3.5–5.1)
Sodium: 140 mmol/L (ref 135–145)

## 2016-01-29 LAB — SURGICAL PCR SCREEN
MRSA, PCR: NEGATIVE
Staphylococcus aureus: NEGATIVE

## 2016-01-29 NOTE — Anesthesia Preprocedure Evaluation (Addendum)
Anesthesia Evaluation  Patient identified by MRN, date of birth, ID band Patient awake    Reviewed: Allergy & Precautions, H&P , NPO status , Patient's Chart, lab work & pertinent test results  Airway Mallampati: III  TM Distance: >3 FB Neck ROM: Full    Dental no notable dental hx. (+) Teeth Intact, Dental Advisory Given   Pulmonary asthma , sleep apnea and Continuous Positive Airway Pressure Ventilation , Current Smoker,    Pulmonary exam normal breath sounds clear to auscultation       Cardiovascular hypertension, Pt. on medications  Rhythm:Regular Rate:Normal     Neuro/Psych Depression negative neurological ROS     GI/Hepatic Neg liver ROS, GERD  Medicated and Controlled,  Endo/Other  negative endocrine ROS  Renal/GU negative Renal ROS  negative genitourinary   Musculoskeletal  (+) Arthritis , Osteoarthritis,  Fibromyalgia -  Abdominal   Peds  Hematology negative hematology ROS (+)   Anesthesia Other Findings   Reproductive/Obstetrics negative OB ROS                            Anesthesia Physical Anesthesia Plan  ASA: III  Anesthesia Plan: General and Regional   Post-op Pain Management: GA combined w/ Regional for post-op pain   Induction: Intravenous  Airway Management Planned: Oral ETT  Additional Equipment:   Intra-op Plan:   Post-operative Plan: Extubation in OR  Informed Consent: I have reviewed the patients History and Physical, chart, labs and discussed the procedure including the risks, benefits and alternatives for the proposed anesthesia with the patient or authorized representative who has indicated his/her understanding and acceptance.   Dental advisory given  Plan Discussed with: CRNA  Anesthesia Plan Comments:         Anesthesia Quick Evaluation

## 2016-01-29 NOTE — Progress Notes (Addendum)
Pt. Waiting to have new CPAP machine delivered after the last study he had is interpreted.   Pt. Reports that he is being followed by Dr. Viann Shove in [place] for sleep apnea. J. Martinique in [place] cares for  primary needs. Last yr. Had some cardiac studies done, shown to be wnl.  Pt. Reports that his chest & breathing have not changed since he recovered from the Pneumonia that he had earlier this year.

## 2016-01-30 ENCOUNTER — Inpatient Hospital Stay (HOSPITAL_COMMUNITY): Payer: Medicare Other | Admitting: Certified Registered Nurse Anesthetist

## 2016-01-30 ENCOUNTER — Inpatient Hospital Stay (HOSPITAL_COMMUNITY)
Admission: RE | Admit: 2016-01-30 | Discharge: 2016-01-31 | DRG: 483 | Disposition: A | Discharge status: Home / Self Care | Payer: Medicare Other | Source: Ambulatory Visit | Attending: Orthopedic Surgery | Admitting: Orthopedic Surgery

## 2016-01-30 ENCOUNTER — Encounter (HOSPITAL_COMMUNITY)
Admission: RE | Disposition: A | Discharge status: Home / Self Care | Payer: Self-pay | Source: Ambulatory Visit | Attending: Orthopedic Surgery

## 2016-01-30 ENCOUNTER — Inpatient Hospital Stay (HOSPITAL_COMMUNITY): Payer: Medicare Other

## 2016-01-30 ENCOUNTER — Encounter (HOSPITAL_COMMUNITY): Payer: Self-pay | Admitting: *Deleted

## 2016-01-30 DIAGNOSIS — M25512 Pain in left shoulder: Secondary | ICD-10-CM | POA: Diagnosis not present

## 2016-01-30 DIAGNOSIS — K219 Gastro-esophageal reflux disease without esophagitis: Secondary | ICD-10-CM | POA: Diagnosis present

## 2016-01-30 DIAGNOSIS — I10 Essential (primary) hypertension: Secondary | ICD-10-CM | POA: Diagnosis present

## 2016-01-30 DIAGNOSIS — M19012 Primary osteoarthritis, left shoulder: Principal | ICD-10-CM | POA: Diagnosis present

## 2016-01-30 DIAGNOSIS — S46002A Unspecified injury of muscle(s) and tendon(s) of the rotator cuff of left shoulder, initial encounter: Secondary | ICD-10-CM | POA: Diagnosis not present

## 2016-01-30 DIAGNOSIS — G4730 Sleep apnea, unspecified: Secondary | ICD-10-CM | POA: Diagnosis present

## 2016-01-30 DIAGNOSIS — Z885 Allergy status to narcotic agent status: Secondary | ICD-10-CM | POA: Diagnosis not present

## 2016-01-30 DIAGNOSIS — Z471 Aftercare following joint replacement surgery: Secondary | ICD-10-CM | POA: Diagnosis not present

## 2016-01-30 DIAGNOSIS — Z7982 Long term (current) use of aspirin: Secondary | ICD-10-CM

## 2016-01-30 DIAGNOSIS — Z96612 Presence of left artificial shoulder joint: Secondary | ICD-10-CM

## 2016-01-30 DIAGNOSIS — J45909 Unspecified asthma, uncomplicated: Secondary | ICD-10-CM | POA: Diagnosis present

## 2016-01-30 DIAGNOSIS — M75102 Unspecified rotator cuff tear or rupture of left shoulder, not specified as traumatic: Secondary | ICD-10-CM | POA: Diagnosis not present

## 2016-01-30 DIAGNOSIS — Z888 Allergy status to other drugs, medicaments and biological substances status: Secondary | ICD-10-CM | POA: Diagnosis not present

## 2016-01-30 DIAGNOSIS — S43432A Superior glenoid labrum lesion of left shoulder, initial encounter: Secondary | ICD-10-CM | POA: Diagnosis not present

## 2016-01-30 DIAGNOSIS — F17200 Nicotine dependence, unspecified, uncomplicated: Secondary | ICD-10-CM | POA: Diagnosis present

## 2016-01-30 DIAGNOSIS — F329 Major depressive disorder, single episode, unspecified: Secondary | ICD-10-CM | POA: Diagnosis present

## 2016-01-30 DIAGNOSIS — G8918 Other acute postprocedural pain: Secondary | ICD-10-CM | POA: Diagnosis not present

## 2016-01-30 DIAGNOSIS — M797 Fibromyalgia: Secondary | ICD-10-CM | POA: Diagnosis present

## 2016-01-30 HISTORY — DX: Presence of left artificial shoulder joint: Z96.612

## 2016-01-30 HISTORY — DX: Malignant (primary) neoplasm, unspecified: C80.1

## 2016-01-30 HISTORY — PX: REVERSE SHOULDER ARTHROPLASTY: SHX5054

## 2016-01-30 HISTORY — DX: Gastro-esophageal reflux disease without esophagitis: K21.9

## 2016-01-30 HISTORY — DX: Pneumonia, unspecified organism: J18.9

## 2016-01-30 HISTORY — DX: Sleep apnea, unspecified: G47.30

## 2016-01-30 HISTORY — DX: Unspecified osteoarthritis, unspecified site: M19.90

## 2016-01-30 HISTORY — DX: Fibromyalgia: M79.7

## 2016-01-30 IMAGING — CR DG SHOULDER 1V*L*
1 series · 1 of 1 positions shown · non-contrast
Comparison: None.

CLINICAL DATA: Status post shoulder replacement.

EXAM:
LEFT SHOULDER - 1 VIEW

[ap]
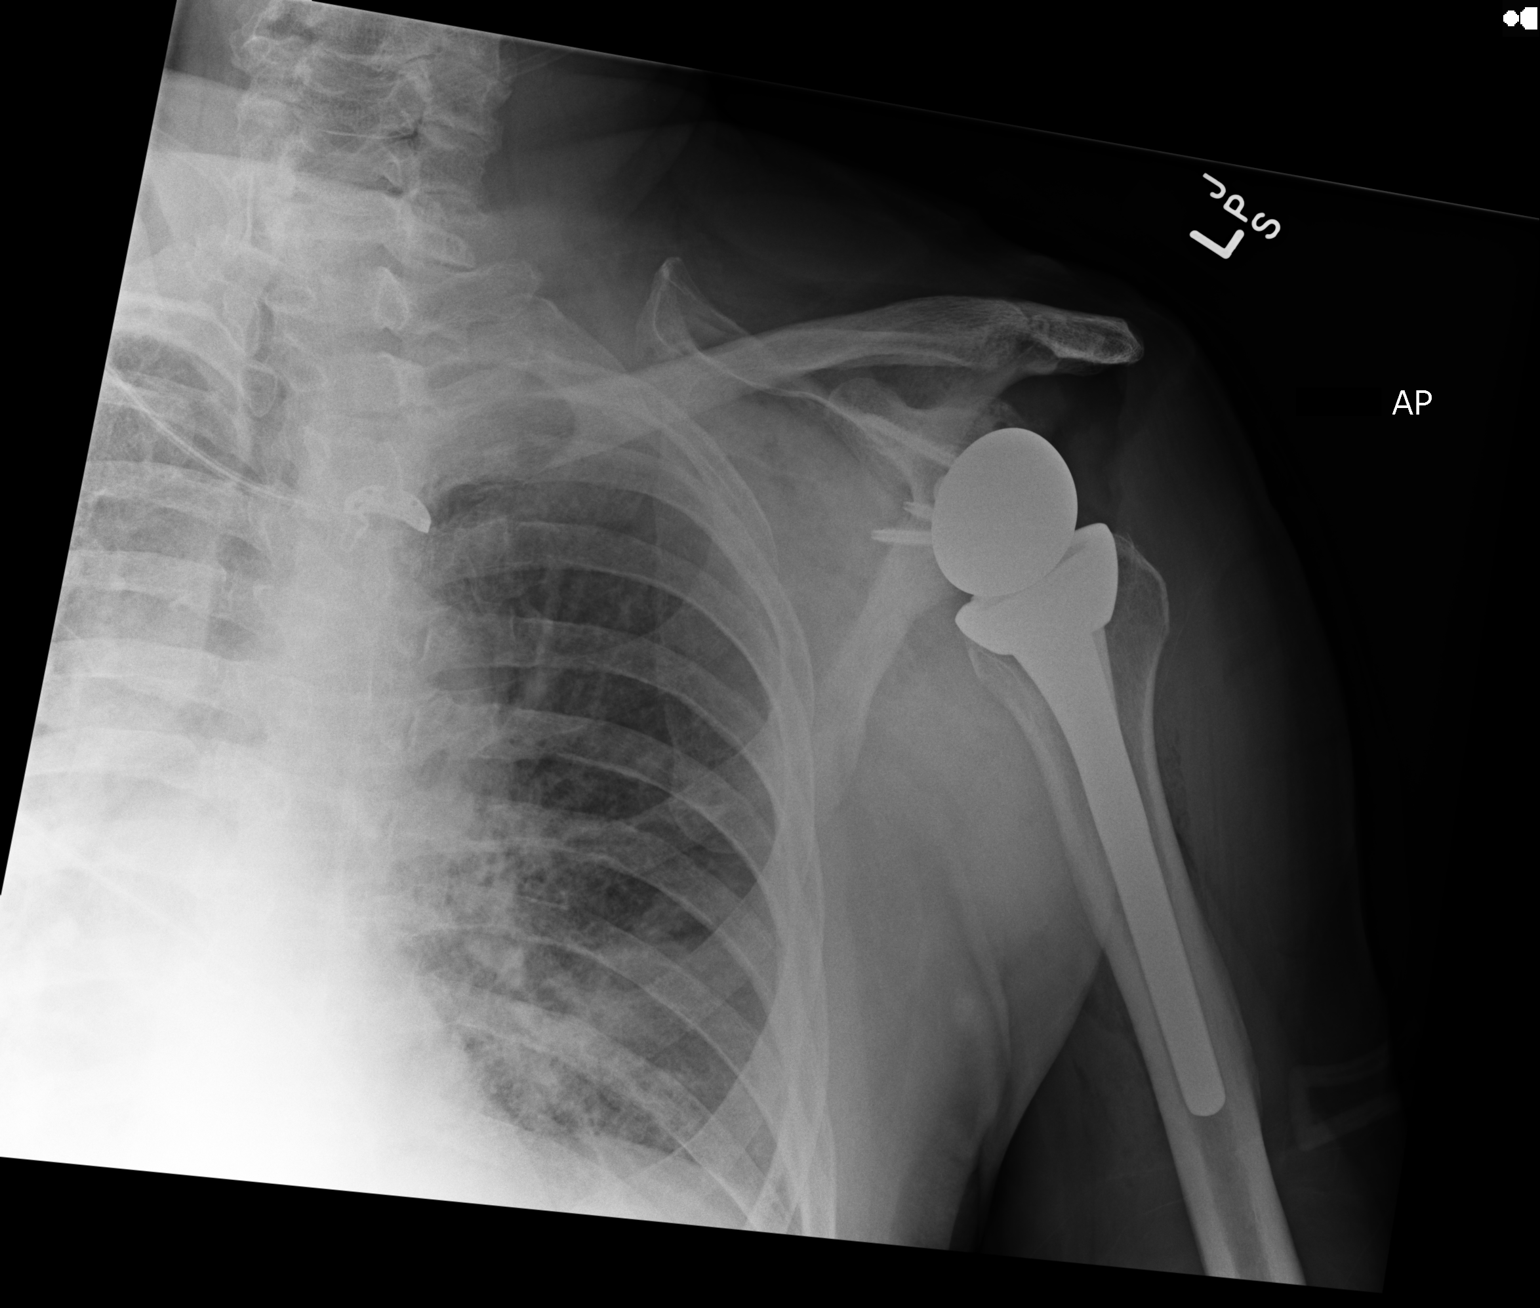

[1 of 1 positions shown; findings below may reference images not displayed]

FINDINGS: Single portable view of left shoulder demonstrates arthroplasty
changes. No acute hardware complication or complicating fracture.
Visualized portion of the left hemithorax is normal.
IMPRESSION: Expected appearance after left shoulder arthroplasty.

## 2016-01-30 SURGERY — ARTHROPLASTY, SHOULDER, TOTAL, REVERSE
Anesthesia: Regional | Laterality: Left

## 2016-01-30 MED ORDER — CHLORHEXIDINE GLUCONATE 4 % EX LIQD: 60.0000 mL | Freq: Once | CUTANEOUS | Status: DC

## 2016-01-30 MED ORDER — FENTANYL CITRATE (PF) 100 MCG/2ML IJ SOLN
INTRAMUSCULAR | Status: DC | PRN
  Administered 2016-01-30: 100 ug via INTRAVENOUS
  Administered 2016-01-30 (×2): 50 ug via INTRAVENOUS

## 2016-01-30 MED ORDER — TAMSULOSIN HCL 0.4 MG PO CAPS
0.4000 mg | ORAL_CAPSULE | Freq: Two times a day (BID) | ORAL | Status: DC
  Administered 2016-01-30 – 2016-01-31 (×2): 0.4 mg via ORAL
  Filled 2016-01-30 (×2): qty 1

## 2016-01-30 MED ORDER — PHENYLEPHRINE HCL 10 MG/ML IJ SOLN
INTRAMUSCULAR | Status: DC | PRN
  Administered 2016-01-30: 40 ug via INTRAVENOUS
  Administered 2016-01-30 (×2): 80 ug via INTRAVENOUS
  Administered 2016-01-30: 120 ug via INTRAVENOUS
  Administered 2016-01-30: 80 ug via INTRAVENOUS

## 2016-01-30 MED ORDER — VITAMIN C 500 MG PO TABS: 500.0000 mg | ORAL_TABLET | Freq: Every day | ORAL | Status: DC

## 2016-01-30 MED ORDER — ONDANSETRON HCL 4 MG PO TABS: 4.0000 mg | ORAL_TABLET | Freq: Four times a day (QID) | ORAL | Status: DC | PRN

## 2016-01-30 MED ORDER — BUPIVACAINE-EPINEPHRINE (PF) 0.5% -1:200000 IJ SOLN
INTRAMUSCULAR | Status: DC | PRN
  Administered 2016-01-30: 30 mL via PERINEURAL

## 2016-01-30 MED ORDER — STERILE WATER FOR IRRIGATION IR SOLN
Status: DC | PRN
  Administered 2016-01-30: 1000 mL

## 2016-01-30 MED ORDER — EPHEDRINE 5 MG/ML INJ
INTRAVENOUS | Status: AC
  Filled 2016-01-30: qty 10

## 2016-01-30 MED ORDER — MIDAZOLAM HCL 2 MG/2ML IJ SOLN
INTRAMUSCULAR | Status: AC
  Filled 2016-01-30: qty 2

## 2016-01-30 MED ORDER — IPRATROPIUM-ALBUTEROL 0.5-2.5 (3) MG/3ML IN SOLN
3.0000 mL | Freq: Four times a day (QID) | RESPIRATORY_TRACT | Status: DC | PRN
  Administered 2016-01-30: 3 mL via RESPIRATORY_TRACT
  Filled 2016-01-30: qty 3

## 2016-01-30 MED ORDER — OXYCODONE-ACETAMINOPHEN 7.5-325 MG PO TABS
1.0000 | ORAL_TABLET | ORAL | Status: DC | PRN
  Administered 2016-01-30 – 2016-01-31 (×4): 2 via ORAL
  Filled 2016-01-30 (×5): qty 2

## 2016-01-30 MED ORDER — 0.9 % SODIUM CHLORIDE (POUR BTL) OPTIME
TOPICAL | Status: DC | PRN
  Administered 2016-01-30: 1000 mL

## 2016-01-30 MED ORDER — EPHEDRINE SULFATE 50 MG/ML IJ SOLN
INTRAMUSCULAR | Status: DC | PRN
  Administered 2016-01-30 (×2): 10 mg via INTRAVENOUS
  Administered 2016-01-30: 5 mg via INTRAVENOUS
  Administered 2016-01-30: 10 mg via INTRAVENOUS
  Administered 2016-01-30 (×4): 5 mg via INTRAVENOUS

## 2016-01-30 MED ORDER — NOREPINEPHRINE BITARTRATE 1 MG/ML IV SOLN
0.0000 ug/min | INTRAVENOUS | Status: DC
  Filled 2016-01-30: qty 4

## 2016-01-30 MED ORDER — METHOCARBAMOL 1000 MG/10ML IJ SOLN
500.0000 mg | Freq: Four times a day (QID) | INTRAVENOUS | Status: DC | PRN
  Filled 2016-01-30: qty 5

## 2016-01-30 MED ORDER — ALBUTEROL SULFATE HFA 108 (90 BASE) MCG/ACT IN AERS: 2.0000 | INHALATION_SPRAY | Freq: Four times a day (QID) | RESPIRATORY_TRACT | Status: DC | PRN

## 2016-01-30 MED ORDER — POLYETHYLENE GLYCOL 3350 17 G PO PACK: 17.0000 g | PACK | Freq: Every day | ORAL | Status: DC | PRN

## 2016-01-30 MED ORDER — ONDANSETRON HCL 4 MG/2ML IJ SOLN
INTRAMUSCULAR | Status: AC
  Filled 2016-01-30: qty 2

## 2016-01-30 MED ORDER — LACTATED RINGERS IV SOLN
INTRAVENOUS | Status: DC | PRN
  Administered 2016-01-30 (×2): via INTRAVENOUS

## 2016-01-30 MED ORDER — SUCCINYLCHOLINE CHLORIDE 200 MG/10ML IV SOSY
PREFILLED_SYRINGE | INTRAVENOUS | Status: AC
  Filled 2016-01-30: qty 10

## 2016-01-30 MED ORDER — PHENYLEPHRINE HCL 10 MG/ML IJ SOLN
INTRAVENOUS | Status: DC | PRN
  Administered 2016-01-30: 25 ug/min via INTRAVENOUS

## 2016-01-30 MED ORDER — DULOXETINE HCL 30 MG PO CPEP
90.0000 mg | ORAL_CAPSULE | Freq: Every day | ORAL | Status: DC
  Administered 2016-01-31: 90 mg via ORAL
  Filled 2016-01-30: qty 3

## 2016-01-30 MED ORDER — LIDOCAINE 2% (20 MG/ML) 5 ML SYRINGE
INTRAMUSCULAR | Status: AC
  Filled 2016-01-30: qty 5

## 2016-01-30 MED ORDER — PHENYLEPHRINE 40 MCG/ML (10ML) SYRINGE FOR IV PUSH (FOR BLOOD PRESSURE SUPPORT)
PREFILLED_SYRINGE | INTRAVENOUS | Status: AC
  Filled 2016-01-30: qty 10

## 2016-01-30 MED ORDER — OMEGA-3-ACID ETHYL ESTERS 1 G PO CAPS: 2.0000 g | ORAL_CAPSULE | Freq: Every day | ORAL | Status: DC

## 2016-01-30 MED ORDER — METHOCARBAMOL 500 MG PO TABS
500.0000 mg | ORAL_TABLET | Freq: Four times a day (QID) | ORAL | Status: DC | PRN
  Administered 2016-01-30: 500 mg via ORAL
  Filled 2016-01-30: qty 1

## 2016-01-30 MED ORDER — TESTOSTERONE 30 MG/ACT TD SOLN: 90.0000 mg | Freq: Every day | TRANSDERMAL | Status: DC

## 2016-01-30 MED ORDER — LISINOPRIL 5 MG PO TABS
5.0000 mg | ORAL_TABLET | Freq: Every day | ORAL | Status: DC
  Administered 2016-01-30: 5 mg via ORAL
  Filled 2016-01-30: qty 1

## 2016-01-30 MED ORDER — ALBUTEROL SULFATE (2.5 MG/3ML) 0.083% IN NEBU
2.5000 mg | INHALATION_SOLUTION | Freq: Four times a day (QID) | RESPIRATORY_TRACT | Status: DC | PRN
  Administered 2016-01-31: 2.5 mg via RESPIRATORY_TRACT

## 2016-01-30 MED ORDER — METOCLOPRAMIDE HCL 5 MG/ML IJ SOLN: 5.0000 mg | Freq: Three times a day (TID) | INTRAMUSCULAR | Status: DC | PRN

## 2016-01-30 MED ORDER — PROPOFOL 10 MG/ML IV BOLUS
INTRAVENOUS | Status: DC | PRN
  Administered 2016-01-30: 120 mg via INTRAVENOUS

## 2016-01-30 MED ORDER — FLUTICASONE PROPIONATE 50 MCG/ACT NA SUSP
1.0000 | Freq: Every day | NASAL | Status: DC | PRN
  Filled 2016-01-30: qty 16

## 2016-01-30 MED ORDER — ACETAMINOPHEN 325 MG PO TABS: 650.0000 mg | ORAL_TABLET | Freq: Four times a day (QID) | ORAL | Status: DC | PRN

## 2016-01-30 MED ORDER — DULOXETINE HCL 60 MG PO CPEP: 60.0000 mg | ORAL_CAPSULE | Freq: Every day | ORAL | Status: DC

## 2016-01-30 MED ORDER — B COMPLEX-C PO TABS: 1.0000 | ORAL_TABLET | Freq: Every day | ORAL | Status: DC

## 2016-01-30 MED ORDER — ALBUMIN HUMAN 5 % IV SOLN
INTRAVENOUS | Status: DC | PRN
  Administered 2016-01-30 (×2): via INTRAVENOUS

## 2016-01-30 MED ORDER — MOMETASONE FURO-FORMOTEROL FUM 200-5 MCG/ACT IN AERO
2.0000 | INHALATION_SPRAY | Freq: Two times a day (BID) | RESPIRATORY_TRACT | Status: DC
  Administered 2016-01-31: 2 via RESPIRATORY_TRACT
  Filled 2016-01-30: qty 8.8

## 2016-01-30 MED ORDER — CEFAZOLIN SODIUM-DEXTROSE 2-4 GM/100ML-% IV SOLN
2.0000 g | Freq: Four times a day (QID) | INTRAVENOUS | Status: AC
Start: 2016-01-30 — End: 2016-01-31
  Administered 2016-01-30 – 2016-01-31 (×3): 2 g via INTRAVENOUS
  Filled 2016-01-30 (×3): qty 100

## 2016-01-30 MED ORDER — NOREPINEPHRINE BITARTRATE 1 MG/ML IV SOLN
INTRAVENOUS | Status: DC | PRN
  Administered 2016-01-30: 4 ug/min via INTRAVENOUS

## 2016-01-30 MED ORDER — OXYCODONE-ACETAMINOPHEN 7.5-325 MG PO TABS: 1.0000 | ORAL_TABLET | ORAL | 0 refills | Status: DC | PRN

## 2016-01-30 MED ORDER — ONDANSETRON HCL 4 MG/2ML IJ SOLN
INTRAMUSCULAR | Status: DC | PRN
  Administered 2016-01-30: 4 mg via INTRAVENOUS

## 2016-01-30 MED ORDER — HYDROMORPHONE HCL 2 MG/ML IJ SOLN
1.0000 mg | INTRAMUSCULAR | Status: DC | PRN
  Administered 2016-01-31: 1 mg via INTRAVENOUS
  Filled 2016-01-30: qty 1

## 2016-01-30 MED ORDER — HYDROMORPHONE HCL 1 MG/ML IJ SOLN: 0.2500 mg | INTRAMUSCULAR | Status: DC | PRN

## 2016-01-30 MED ORDER — ADULT MULTIVITAMIN W/MINERALS CH
1.0000 | ORAL_TABLET | Freq: Every day | ORAL | Status: DC
  Administered 2016-01-30: 1 via ORAL
  Filled 2016-01-30: qty 1

## 2016-01-30 MED ORDER — BUPIVACAINE HCL (PF) 0.25 % IJ SOLN
INTRAMUSCULAR | Status: DC | PRN
  Administered 2016-01-30: 7 mL

## 2016-01-30 MED ORDER — SUCCINYLCHOLINE CHLORIDE 20 MG/ML IJ SOLN
INTRAMUSCULAR | Status: DC | PRN
  Administered 2016-01-30: 140 mg via INTRAVENOUS

## 2016-01-30 MED ORDER — CEFAZOLIN SODIUM-DEXTROSE 2-4 GM/100ML-% IV SOLN
INTRAVENOUS | Status: AC
  Filled 2016-01-30: qty 100

## 2016-01-30 MED ORDER — ASPIRIN EC 81 MG PO TBEC
81.0000 mg | DELAYED_RELEASE_TABLET | Freq: Every day | ORAL | Status: DC
  Administered 2016-01-30: 81 mg via ORAL
  Filled 2016-01-30: qty 1

## 2016-01-30 MED ORDER — MIDAZOLAM HCL 5 MG/5ML IJ SOLN
INTRAMUSCULAR | Status: DC | PRN
  Administered 2016-01-30 (×2): 1 mg via INTRAVENOUS

## 2016-01-30 MED ORDER — METHOCARBAMOL 500 MG PO TABS: 500.0000 mg | ORAL_TABLET | Freq: Three times a day (TID) | ORAL | 1 refills | Status: DC | PRN

## 2016-01-30 MED ORDER — MENTHOL 3 MG MT LOZG: 1.0000 | LOZENGE | OROMUCOSAL | Status: DC | PRN

## 2016-01-30 MED ORDER — PANTOPRAZOLE SODIUM 40 MG PO TBEC: 80.0000 mg | DELAYED_RELEASE_TABLET | Freq: Every day | ORAL | Status: DC

## 2016-01-30 MED ORDER — PROPOFOL 10 MG/ML IV BOLUS
INTRAVENOUS | Status: AC
  Filled 2016-01-30: qty 40

## 2016-01-30 MED ORDER — CEFAZOLIN SODIUM-DEXTROSE 2-4 GM/100ML-% IV SOLN
2.0000 g | INTRAVENOUS | Status: AC
  Administered 2016-01-30: 2 g via INTRAVENOUS

## 2016-01-30 MED ORDER — FENTANYL CITRATE (PF) 100 MCG/2ML IJ SOLN
INTRAMUSCULAR | Status: AC
  Filled 2016-01-30: qty 2

## 2016-01-30 MED ORDER — NICOTINE 21 MG/24HR TD PT24: 21.0000 mg | MEDICATED_PATCH | Freq: Every day | TRANSDERMAL | Status: DC

## 2016-01-30 MED ORDER — SODIUM CHLORIDE 0.9 % IV SOLN
INTRAVENOUS | Status: DC
  Administered 2016-01-30: 12:00:00 via INTRAVENOUS

## 2016-01-30 MED ORDER — ACETAMINOPHEN 650 MG RE SUPP: 650.0000 mg | Freq: Four times a day (QID) | RECTAL | Status: DC | PRN

## 2016-01-30 MED ORDER — ATORVASTATIN CALCIUM 20 MG PO TABS
20.0000 mg | ORAL_TABLET | Freq: Every day | ORAL | Status: DC
  Administered 2016-01-30: 20 mg via ORAL
  Filled 2016-01-30: qty 1

## 2016-01-30 MED ORDER — PHENOL 1.4 % MT LIQD: 1.0000 | OROMUCOSAL | Status: DC | PRN

## 2016-01-30 MED ORDER — ONDANSETRON HCL 4 MG/2ML IJ SOLN: 4.0000 mg | Freq: Four times a day (QID) | INTRAMUSCULAR | Status: DC | PRN

## 2016-01-30 MED ORDER — DOCUSATE SODIUM 100 MG PO CAPS
100.0000 mg | ORAL_CAPSULE | Freq: Two times a day (BID) | ORAL | Status: DC
  Administered 2016-01-30 – 2016-01-31 (×2): 100 mg via ORAL
  Filled 2016-01-30 (×2): qty 1

## 2016-01-30 MED ORDER — METOCLOPRAMIDE HCL 5 MG PO TABS: 5.0000 mg | ORAL_TABLET | Freq: Three times a day (TID) | ORAL | Status: DC | PRN

## 2016-01-30 MED ORDER — LIDOCAINE HCL (CARDIAC) 20 MG/ML IV SOLN
INTRAVENOUS | Status: DC | PRN
  Administered 2016-01-30: 100 mg via INTRAVENOUS

## 2016-01-30 SURGICAL SUPPLY — 68 items
BIT DRILL 170X2.5X (BIT) IMPLANT
BIT DRILL 5/64X5 DISP (BIT) ×3 IMPLANT
BIT DRL 170X2.5X (BIT) ×1
BLADE SAG 18X100X1.27 (BLADE) ×3 IMPLANT
CAPT SHLDR REVTOTAL 1 ×2 IMPLANT
CLOSURE WOUND 1/2 X4 (GAUZE/BANDAGES/DRESSINGS) ×1
COVER SURGICAL LIGHT HANDLE (MISCELLANEOUS) ×3 IMPLANT
DRAPE IMP U-DRAPE 54X76 (DRAPES) ×6 IMPLANT
DRAPE INCISE IOBAN 66X45 STRL (DRAPES) ×3 IMPLANT
DRAPE ORTHO SPLIT 77X108 STRL (DRAPES) ×6
DRAPE SURG ORHT 6 SPLT 77X108 (DRAPES) ×2 IMPLANT
DRAPE U-SHAPE 47X51 STRL (DRAPES) ×3 IMPLANT
DRAPE X-RAY CASS 24X20 (DRAPES) IMPLANT
DRILL 2.5 (BIT) ×3
DRSG ADAPTIC 3X8 NADH LF (GAUZE/BANDAGES/DRESSINGS) ×3 IMPLANT
DRSG PAD ABDOMINAL 8X10 ST (GAUZE/BANDAGES/DRESSINGS) ×3 IMPLANT
DURAPREP 26ML APPLICATOR (WOUND CARE) ×3 IMPLANT
ELECT BLADE 4.0 EZ CLEAN MEGAD (MISCELLANEOUS) ×3
ELECT NEEDLE TIP 2.8 STRL (NEEDLE) ×3 IMPLANT
ELECT REM PT RETURN 9FT ADLT (ELECTROSURGICAL) ×3
ELECTRODE BLDE 4.0 EZ CLN MEGD (MISCELLANEOUS) ×1 IMPLANT
ELECTRODE REM PT RTRN 9FT ADLT (ELECTROSURGICAL) ×1 IMPLANT
GAUZE SPONGE 4X4 12PLY STRL (GAUZE/BANDAGES/DRESSINGS) ×3 IMPLANT
GLOVE BIOGEL PI ORTHO PRO 7.5 (GLOVE) ×2
GLOVE BIOGEL PI ORTHO PRO SZ8 (GLOVE) ×2
GLOVE ORTHO TXT STRL SZ7.5 (GLOVE) ×3 IMPLANT
GLOVE PI ORTHO PRO STRL 7.5 (GLOVE) ×1 IMPLANT
GLOVE PI ORTHO PRO STRL SZ8 (GLOVE) ×1 IMPLANT
GLOVE SURG ORTHO 8.5 STRL (GLOVE) ×3 IMPLANT
GOWN STRL REUS W/ TWL LRG LVL3 (GOWN DISPOSABLE) ×1 IMPLANT
GOWN STRL REUS W/ TWL XL LVL3 (GOWN DISPOSABLE) ×2 IMPLANT
GOWN STRL REUS W/TWL LRG LVL3 (GOWN DISPOSABLE) ×3
GOWN STRL REUS W/TWL XL LVL3 (GOWN DISPOSABLE) ×6
HANDPIECE INTERPULSE COAX TIP (DISPOSABLE)
KIT BASIN OR (CUSTOM PROCEDURE TRAY) ×3 IMPLANT
KIT ROOM TURNOVER OR (KITS) ×3 IMPLANT
MANIFOLD NEPTUNE II (INSTRUMENTS) ×3 IMPLANT
NDL 1/2 CIR MAYO (NEEDLE) ×1 IMPLANT
NDL HYPO 25GX1X1/2 BEV (NEEDLE) ×1 IMPLANT
NEEDLE 1/2 CIR MAYO (NEEDLE) ×3 IMPLANT
NEEDLE HYPO 25GX1X1/2 BEV (NEEDLE) ×3 IMPLANT
NS IRRIG 1000ML POUR BTL (IV SOLUTION) ×3 IMPLANT
PACK SHOULDER (CUSTOM PROCEDURE TRAY) ×3 IMPLANT
PAD ARMBOARD 7.5X6 YLW CONV (MISCELLANEOUS) ×6 IMPLANT
PIN METAGLENE 2.5 (PIN) ×4 IMPLANT
SET HNDPC FAN SPRY TIP SCT (DISPOSABLE) IMPLANT
SLING ARM IMMOBILIZER LRG (SOFTGOODS) ×2 IMPLANT
SLING ARM LRG ADULT FOAM STRAP (SOFTGOODS) ×2 IMPLANT
SLING ARM MED ADULT FOAM STRAP (SOFTGOODS) IMPLANT
SPONGE GAUZE 4X4 12PLY STER LF (GAUZE/BANDAGES/DRESSINGS) ×2 IMPLANT
SPONGE LAP 18X18 X RAY DECT (DISPOSABLE) IMPLANT
SPONGE LAP 4X18 X RAY DECT (DISPOSABLE) ×3 IMPLANT
STRIP CLOSURE SKIN 1/2X4 (GAUZE/BANDAGES/DRESSINGS) ×2 IMPLANT
SUCTION FRAZIER HANDLE 10FR (MISCELLANEOUS) ×2
SUCTION TUBE FRAZIER 10FR DISP (MISCELLANEOUS) ×1 IMPLANT
SUT FIBERWIRE #2 38 T-5 BLUE (SUTURE) ×6
SUT MNCRL AB 4-0 PS2 18 (SUTURE) ×3 IMPLANT
SUT VIC AB 2-0 CT1 27 (SUTURE) ×3
SUT VIC AB 2-0 CT1 TAPERPNT 27 (SUTURE) ×1 IMPLANT
SUT VICRYL 0 CT 1 36IN (SUTURE) ×3 IMPLANT
SUTURE FIBERWR #2 38 T-5 BLUE (SUTURE) ×2 IMPLANT
SYR CONTROL 10ML LL (SYRINGE) ×3 IMPLANT
TOWEL OR 17X24 6PK STRL BLUE (TOWEL DISPOSABLE) ×3 IMPLANT
TOWEL OR 17X26 10 PK STRL BLUE (TOWEL DISPOSABLE) ×3 IMPLANT
TOWER CARTRIDGE SMART MIX (DISPOSABLE) IMPLANT
TRAY FOLEY CATH 16FRSI W/METER (SET/KITS/TRAYS/PACK) IMPLANT
WATER STERILE IRR 1000ML POUR (IV SOLUTION) ×1 IMPLANT
YANKAUER SUCT BULB TIP NO VENT (SUCTIONS) ×6 IMPLANT

## 2016-01-30 NOTE — Interval H&P Note (Signed)
History and Physical Interval Note:  01/30/2016 7:21 AM  Frederick Guzman  has presented today for surgery, with the diagnosis of LEFT SHOULDER OA,ROTATOR CUFF INSUFFIENCY  The various methods of treatment have been discussed with the patient and family. After consideration of risks, benefits and other options for treatment, the patient has consented to  Procedure(s): LEFT REVERSE SHOULDER ARTHROPLASTY (Left) as a surgical intervention .  The patient's history has been reviewed, patient examined, no change in status, stable for surgery.  I have reviewed the patient's chart and labs.  Questions were answered to the patient's satisfaction.     Armandina Iman,STEVEN R

## 2016-01-30 NOTE — Op Note (Signed)
NAMESTEVIE, POISSON NO.:  [id number]  MEDICAL RECORD NO.:  [id number]  LOCATION:  [facility]                         FACILITY:  [facility]  PHYSICIAN:  Doran Heater. Veverly Fells, M.D. DATE OF BIRTH:  06-23-1950  DATE OF PROCEDURE:  01/30/2016 DATE OF DISCHARGE:                              OPERATIVE REPORT   PREOPERATIVE DIAGNOSIS:  Left shoulder end-stage osteoarthritis and rotator cuff insufficiency.  POSTOPERATIVE DIAGNOSIS:  Left shoulder end-stage osteoarthritis and rotator cuff insufficiency.  PROCEDURE PERFORMED:  Left shoulder reverse total shoulder arthroplasty using DePuy Delta Xtend prosthesis.  ATTENDING SURGEON:  Doran Heater. Veverly Fells, M.D.  ASSISTANT:  Abbott Pao. Dixon, PA-C, who scrubbed the entire procedure and necessary for satisfactory completion of surgery.  ANESTHESIA:  General anesthesia was used, plus interscalene block.  ESTIMATED BLOOD LOSS:  Less than 100 mL.  FLUID REPLACEMENT:  1500 mL of crystalloid and 500 mL of albumin.  INSTRUMENT COUNTS:  Correct.  COMPLICATIONS:  There were no complications.  ANTIBIOTICS:  Perioperative antibiotics were given.  INDICATIONS:  The patient is a 65 year old male with worsening left shoulder pain and dysfunction secondary to rotator cuff insufficiency and severe osteoarthritis.  The patient has had progressive pain despite conservative management and presents having failed conservative management desiring operative treatment to restore function and eliminate pain.  Discussed options, recommending reverse total shoulder arthroplasty.  Limitations of the reverse shoulder replacement were discussed in detail with the patient as well the risks and benefits. The patient opted to proceed with this operation.  Informed consent obtained.  DESCRIPTION OF PROCEDURE:  After an adequate level of anesthesia achieved, the patient was positioned in the modified beach-chair position.  Left shoulder correctly identified and  sterilely prepped and draped in the usual manner.  A time-out was called.  We entered the shoulder with standard anterior deltopectoral incision.  We started at the coracoid process, extending down to the anterior humerus. Dissection down through subcutaneous tissues using Bovie electrocautery. Cephalic vein identified and taken laterally to the deltoid, pectoralis taken medially.  Conjoint tendon identified and retracted medially. Extensive scar tissue noted in the subdeltoid plane.  This was released using the Bovie and also Cobb elevator.  We progressively mobilized that subdeltoid interval and subcoracoid interval gently using digital pressure with my finger as well as the Cobb.  Eventually, were able to get some mobility back into the shoulder.  We were able to get our retractors placed to release the subscapularis, which was in bad shape. Tearing was noted in the subscapularis tendon and it was attenuated.  We went ahead and tagged it for retraction, but not repair.  We progressively released off the inferior humeral neck, which was quite scarred.  Once we had that completely released, then we placed our [facility].  We did tenodese the biceps tendon in situ with 0 Vicryl figure-of-eight suture x2.  Once we had that successfully tenodesed, we released it.  There was no supraspinatus or infraspinatus tendons remaining.  Teres minor was a little bit in the back, hoping that muscle still be functional for the patient to provide some external rotation when the arm was abducted, but we did remove quite a bit of suture material, SutureBridge material  from the dorsal aspect of the shoulder.  We then entered the proximal humerus with a 6-mm reamer.  We reamed up to size 12.  We then placed our intramedullary resection guide.  It was a 12-mm guide and then this was set on 10 degrees of retroversion.  We resected the head with an oscillating saw.  We went ahead and removed the excess  osteophytes.  We then reamed for the size 2 left metaphyseal component and once we had that reamed, we placed the 12 body epi-2 left metaphysis, set on 0 setting and placed in 10 degrees retroversion.  We retracted the humerus posteriorly.  We did a 360- degree capsulectomy and protected the axillary nerve during this portion of procedure.  We removed the glenoid labrum and the remaining cartilage with a Cobb elevator.  We found the center point for our guide pin placement.  Once we had the guide pin placed, we reamed for the metaglene.  We then did our peripheral hand reaming and then drilled our central peg hole for the metaglene and impacted the metaglene into position, paying attention to the 12 and 6 o'clock positions.  We were able to get a nice 48 screw inferiorly locked and the 42 in the base of coracoid locked as well, and then a 24 locked into the anterior screw hole.  With 3 good locked screws, we went ahead and irrigated thoroughly, placed our 42 standard glenosphere, impacted that and screwed that home.  Once that was secure, we checked to make sure an axillary nerve was free and clear, which was, we then went ahead and reduced the shoulder with a 42 +3 poly insert trial and we were happy with the soft tissue balancing.  At this point, we removed the trial components from the humerus, irrigated thoroughly, and then impacted the real HA coated press-fit stem, size 12 body, with the epi-2 left, placed on the 0 setting in 10 degrees of retroversion.  Once we had that impacted, we took selected real 42 +3 poly, impacted that in place, reduced the shoulder with a nice little pop, it was very stable throughout a full arc motion.  We irrigated thoroughly, resected the remaining scarred subscap and went ahead and closed the deltopectoral interval after a thorough irrigation with another liter of fluid.  We used 0 Vicryl suture for the deltopectoral closure and 2-0 Vicryl subcutaneous  closure and 4-0 Monocryl for skin.  Steri-Strips applied, followed by sterile dressing and a sling.  The patient tolerated the surgery well.     Doran Heater. Veverly Fells, M.D.     SRN/MEDQ  D:  01/30/2016  T:  01/30/2016  Job:  [id number]

## 2016-01-30 NOTE — Progress Notes (Signed)
Patient c/o of mid sternal chest pain, non radiating, vitals were taken, wnl, and pain medication given, patient stated he felt it was from his shoulder surgery, explained that RN was still going to let Dr Veverly Fells know of the pain, patient was then described his pain as feeling like a bubble, and that he felt like he needed to belch. Patient was repositioned and then sat up on side of bed, patient stated with the repositioning to sitting up on side of the bed the pain was gone, Dr Veverly Fells aware and stated if it happened again to obtain an ekg. Will continue to monitor

## 2016-01-30 NOTE — Anesthesia Procedure Notes (Addendum)
Anesthesia Regional Block:  Interscalene brachial plexus block  Pre-Anesthetic Checklist: ,, timeout performed, Correct Patient, Correct Site, Correct Laterality, Correct Procedure, Correct Position, site marked, Risks and benefits discussed, pre-op evaluation,  At surgeon's request and post-op pain management  Laterality: Left  Prep: Maximum Sterile Barrier Precautions used, chloraprep       Needles:  Injection technique: Single-shot  Needle Type: Echogenic Stimulator Needle     Needle Length: 5cm 5 cm Needle Gauge: 22 and 22 G    Additional Needles:  Procedures: ultrasound guided (picture in chart) and nerve stimulator Interscalene brachial plexus block  Nerve Stimulator or Paresthesia:  Response: Biceps response,   Additional Responses:   Narrative:  Start time: 01/30/2016 7:05 AM End time: 01/30/2016 7:15 AM Injection made incrementally with aspirations every 5 mL. Anesthesiologist: Roderic Palau  Additional Notes: 2% Lidocaine skin wheel.

## 2016-01-30 NOTE — Discharge Instructions (Signed)
Ice to the shoulder as much as possible.  Keep the incision covered for one week, than ok to get it wet in the shower.  Use Gel Bandages every three days to cover the wound (get those from nurse prior to discharge)  Ok to use the arm for light activity, no heavy push pull or lift,  May remove the sling as you would like  Follow up in two weeks in the office [phone]

## 2016-01-30 NOTE — Brief Op Note (Signed)
01/30/2016  9:37 AM  PATIENT:  [name]  65 y.o. male  PRE-OPERATIVE DIAGNOSIS:  LEFT SHOULDER OA,ROTATOR CUFF INSUFFIENCY  POST-OPERATIVE DIAGNOSIS:  LEFT SHOULDER OA,ROTATOR CUFF INSUFFIENCY  PROCEDURE:  Procedure(s): LEFT REVERSE SHOULDER ARTHROPLASTY (Left) DePuy Delta Xtend reverse  SURGEON:  Surgeon(s) and Role:    * Netta Cedars, MD - Primary  PHYSICIAN ASSISTANT:   ASSISTANTS: Ventura Bruns, PA-C   ANESTHESIA:   regional and general  EBL:  Total I/O In: 1500 [I.V.:1000; IV Piggyback:500] Out: 150 [Blood:150]  BLOOD ADMINISTERED:none  DRAINS: none   LOCAL MEDICATIONS USED:  MARCAINE     SPECIMEN:  No Specimen  DISPOSITION OF SPECIMEN:  N/A  COUNTS:  YES  TOURNIQUET:  * No tourniquets in log *  DICTATION: .Other Dictation: Dictation Number [id number]  PLAN OF CARE: Admit to inpatient   PATIENT DISPOSITION:  PACU - hemodynamically stable.   Delay start of Pharmacological VTE agent (>24hrs) due to surgical blood loss or risk of bleeding: not applicable

## 2016-01-30 NOTE — Transfer of Care (Signed)
Immediate Anesthesia Transfer of Care Note  Patient: Frederick Guzman  Procedure(s) Performed: Procedure(s): LEFT REVERSE SHOULDER ARTHROPLASTY (Left)  Patient Location: PACU  Anesthesia Type:GA combined with regional for post-op pain  Level of Consciousness: sedated  Airway & Oxygen Therapy: Patient Spontanous Breathing and Patient connected to face mask oxygen  Post-op Assessment: Report given to RN and Post -op Vital signs reviewed and stable  Post vital signs: Reviewed and stable  Last Vitals:  Vitals:   01/30/16 0723 01/30/16 0724  BP:    Pulse: 87 86  Resp: 14 16  Temp:      Last Pain:  Vitals:   01/30/16 0611  TempSrc: Oral  PainSc:       Patients Stated Pain Goal: 2 ([id number])  Complications: No apparent anesthesia complications

## 2016-01-30 NOTE — Anesthesia Postprocedure Evaluation (Signed)
Anesthesia Post Note  Patient: Frederick Guzman  Procedure(s) Performed: Procedure(s) (LRB): LEFT REVERSE SHOULDER ARTHROPLASTY (Left)  Patient location during evaluation: PACU Anesthesia Type: General and Regional Level of consciousness: awake and alert Pain management: pain level controlled Vital Signs Assessment: post-procedure vital signs reviewed and stable Respiratory status: spontaneous breathing, nonlabored ventilation, respiratory function stable and patient connected to nasal cannula oxygen Cardiovascular status: blood pressure returned to baseline and stable Postop Assessment: no signs of nausea or vomiting Anesthetic complications: no    Last Vitals:  Vitals:   01/30/16 1130 01/30/16 1509  BP: (!) 121/58 (!) 124/59  Pulse: 94 85  Resp: 18   Temp: 36.4 C     Last Pain:  Vitals:   01/30/16 1130  TempSrc: Oral  PainSc:                  Diante Barley,W. EDMOND

## 2016-01-30 NOTE — Anesthesia Procedure Notes (Signed)
Procedure Name: Intubation Date/Time: 01/30/2016 7:46 AM Performed by: Tressia Miners LEFFEW Pre-anesthesia Checklist: Patient identified, Patient being monitored, Timeout performed, Emergency Drugs available and Suction available Patient Re-evaluated:Patient Re-evaluated prior to inductionOxygen Delivery Method: Circle System Utilized Preoxygenation: Pre-oxygenation with 100% oxygen Intubation Type: IV induction Ventilation: Mask ventilation without difficulty and Oral airway inserted - appropriate to patient size Laryngoscope Size: Glidescope and 4 Grade View: Grade I Tube type: Oral Tube size: 7.5 mm Number of attempts: 1 Airway Equipment and Method: Stylet Placement Confirmation: ETT inserted through vocal cords under direct vision,  positive ETCO2 and breath sounds checked- equal and bilateral Secured at: 23 cm Tube secured with: Tape Dental Injury: Teeth and Oropharynx as per pre-operative assessment

## 2016-01-31 LAB — BASIC METABOLIC PANEL
ANION GAP: 8 (ref 5–15)
BUN: 7 mg/dL (ref 6–20)
CO2: 28 mmol/L (ref 22–32)
Calcium: 8.2 mg/dL — ABNORMAL LOW (ref 8.9–10.3)
Chloride: 101 mmol/L (ref 101–111)
Creatinine, Ser: 1 mg/dL (ref 0.61–1.24)
Glucose, Bld: 119 mg/dL — ABNORMAL HIGH (ref 65–99)
POTASSIUM: 3.3 mmol/L — AB (ref 3.5–5.1)
SODIUM: 137 mmol/L (ref 135–145)

## 2016-01-31 LAB — HEMOGLOBIN AND HEMATOCRIT, BLOOD
HEMATOCRIT: 40.5 % (ref 39.0–52.0)
Hemoglobin: 13.6 g/dL (ref 13.0–17.0)

## 2016-01-31 NOTE — Progress Notes (Signed)
Patient has been very agitated because the continuous pulse ox has been beeping.  His oxygen saturation has been drooping to 88 when he falls asleep.  I explained that we need to keep the continuous pulse ox on him for tonight.  I also explained that he need to take deep breaths and have the head of the bed elevated and not flat.

## 2016-01-31 NOTE — Evaluation (Addendum)
Occupational Therapy Evaluation Patient Details Name: Frederick Guzman MRN: [id number] DOB: December 24, 1950 Today's Date: 01/31/2016    History of Present Illness LEFT REVERSE SHOULDER ARTHROPLASTY (Left)   Clinical Impression   Pt admitted with the above diagnoses and presents with below problem list. PTA pt was independent with ADLs. Pt currently setup to min A with ADLs. Spouse assisting at home. ADL, sling, and exercise education provided to pt. Pt with 10/10 pain in L shoulder, premedicated. Ice applied. Nursing notified. No further acute OT needs indicated. Pt ok to d/c from OT standpoint.     Follow Up Recommendations  Supervision - Intermittent;Other (comment) (OOB/mobility)    Equipment Recommendations  None recommended by OT    Recommendations for Other Services       Precautions / Restrictions Precautions Precautions: Shoulder Type of Shoulder Precautions: active protocal Shoulder Interventions: Shoulder sling/immobilizer;For comfort Precaution Booklet Issued: Yes (comment) Precaution Comments: reviewed  Required Braces or Orthoses: Sling Restrictions Weight Bearing Restrictions: Yes LUE Weight Bearing: Non weight bearing      Mobility Bed Mobility Overal bed mobility: Needs Assistance Bed Mobility: Supine to Sit     Supine to sit: Min assist;HOB elevated     General bed mobility comments: Min A with pt holding therapist hand to power up to EOB. Pt reports spouse can assist with this at home.   Transfers Overall transfer level: Needs assistance Equipment used: None Transfers: Sit to/from Stand Sit to Stand: Min guard         General transfer comment: from EOB and toilet    Balance Overall balance assessment: No apparent balance deficits (not formally assessed)                                          ADL Overall ADL's : Needs assistance/impaired Eating/Feeding: Set up;Sitting   Grooming: Minimal assistance;Sitting    Upper Body Bathing: Minimal assitance   Lower Body Bathing: Minimal assistance;Sit to/from stand   Upper Body Dressing : Minimal assistance;Sitting   Lower Body Dressing: Minimal assistance   Toilet Transfer: Min guard;Ambulation   Toileting- Clothing Manipulation and Hygiene: Min guard;Set up;Sitting/lateral lean;Sit to/from stand   Tub/ Shower Transfer: Walk-in shower;Min guard;Ambulation   Functional mobility during ADLs: Min guard General ADL Comments: Reviewed shoulder handout including ADLs. Pt plans for spouse to assist with showering, declined shower seat. Pt completed toilet transfer and bed mobility as well as in-room functional mobility as detailed above.      Vision     Perception     Praxis      Pertinent Vitals/Pain Pain Assessment: 0-10 Pain Score: 10-Worst pain ever Pain Location: L shoulder Pain Descriptors / Indicators: Aching;Grimacing;Sore;Tightness Pain Intervention(s): Limited activity within patient's tolerance;Monitored during session;Premedicated before session;Repositioned;Ice applied;Other (comment) (notified nurse )     Hand Dominance     Extremity/Trunk Assessment Upper Extremity Assessment Upper Extremity Assessment: LUE deficits/detail LUE Deficits / Details: LEFT REVERSE SHOULDER ARTHROPLASTY (Left) LUE: Unable to fully assess due to pain   Lower Extremity Assessment Lower Extremity Assessment: Overall WFL for tasks assessed       Communication Communication Communication: No difficulties   Cognition Arousal/Alertness: Awake/alert Behavior During Therapy: WFL for tasks assessed/performed Overall Cognitive Status: Within Functional Limits for tasks assessed                     General Comments  O2 on RA during session fluctuating high 80s to high 90s. Discussed breathing techniques and to try not to hold breath with transfers and pain.    Exercises Exercises: Shoulder     Shoulder Instructions Shoulder  Instructions Donning/doffing shirt without moving shoulder: Minimal assistance Method for sponge bathing under operated UE: Minimal assistance Donning/doffing sling/immobilizer: Minimal assistance Correct positioning of sling/immobilizer: Modified independent ROM for elbow, wrist and digits of operated UE: Supervision/safety Sling wearing schedule (on at all times/off for ADL's): Independent Proper positioning of operated UE when showering: Independent Positioning of UE while sleeping: [place] expects to be discharged to:: Private residence Living Arrangements: Spouse/significant other Available Help at Discharge: Family;Available PRN/intermittently;Other (Comment) (spouse works) Type of Home: House Home Access: Stairs to enter Technical brewer of Steps: 2   [place]: Two level;Able to live on main level with bedroom/bathroom     Bathroom Shower/Tub: Walk-in shower         Home Equipment: Gilford Rile - 2 wheels;Crutches          Prior Functioning/Environment Level of Independence: Independent                 OT Problem List: Pain;Impaired UE functional use;Decreased knowledge of precautions   OT Treatment/Interventions:      OT Goals(Current goals can be found in the care plan section) Acute Rehab OT Goals Patient Stated Goal: home today  OT Frequency:     Barriers to D/C:            Co-evaluation              End of Session Equipment Utilized During Treatment: Other (comment) (sling) Nurse Communication: Other (comment) (10/10 pain level)  Activity Tolerance: Patient limited by pain Patient left: in chair;with call bell/phone within reach   Time: [id number] OT Time Calculation (min): 40 min Charges:  OT General Charges $OT Visit: 1 Procedure OT Evaluation $OT Eval Low Complexity: 1 Procedure OT Treatments $Self Care/Home Management : 8-22 mins G-Codes:    Hortencia Pilar 02-28-16, 9:41 AM

## 2016-01-31 NOTE — Progress Notes (Signed)
Subjective: 1 Day Post-Op Procedure(s) (LRB): LEFT REVERSE SHOULDER ARTHROPLASTY (Left) Patient reports pain as 4 on 0-10 scale. Doing well. Will DC today.   Objective: Vital signs in last 24 hours: Temp:  [97.5 F (36.4 C)-98.8 F (37.1 C)] 98.8 F (37.1 C) (11/11 0417) Pulse Rate:  [85-106] 102 (11/11 0417) Resp:  [13-22] 18 (11/10 1130) BP: (93-141)/(45-59) 101/52 (11/11 0417) SpO2:  [96 %-100 %] 96 % (11/11 0809)  Intake/Output from previous day: 11/10 0701 - 11/11 0700 In: 1700 [I.V.:1200; IV Piggyback:500] Out: 750 [Urine:600; Blood:150] Intake/Output this shift: No intake/output data recorded.   Recent Labs  01/29/16 1537 01/31/16 0530  HGB 17.4* 13.6    Recent Labs  01/29/16 1537 01/31/16 0530  WBC 11.6*  --   RBC 5.38  --   HCT 48.8 40.5  PLT 212  --     Recent Labs  01/29/16 1537 01/31/16 0530  NA 140 137  K 4.0 3.3*  CL 103 101  CO2 28 28  BUN 6 7  CREATININE 1.01 1.00  GLUCOSE 122* 119*  CALCIUM 9.2 8.2*   No results for input(s): LABPT, INR in the last 72 hours.  Neurologically intact Neurovascular intact  Assessment/Plan: 1 Day Post-Op Procedure(s) (LRB): LEFT REVERSE SHOULDER ARTHROPLASTY (Left) Up with therapy Discharge home with home health  Dasja Brase A 01/31/2016, 8:13 AM

## 2016-02-02 ENCOUNTER — Encounter (HOSPITAL_COMMUNITY): Payer: Self-pay | Admitting: Orthopedic Surgery

## 2016-02-02 NOTE — Discharge Summary (Signed)
Physician Discharge Summary   Patient ID: Frederick Guzman MRN: [id number] DOB/AGE: 09-14-1950 65 y.o.  Admit date: 01/30/2016 Discharge date: 01/31/16  Admission Diagnoses:  Active Problems:   S/P shoulder replacement, left   Discharge Diagnoses:  Same   Surgeries: Procedure(s): LEFT REVERSE SHOULDER ARTHROPLASTY on 01/30/2016   Consultants: PT/OT  Discharged Condition: Stable  Hospital Course: Frederick Guzman is an 65 y.o. male who was admitted 01/30/2016 with a chief complaint of left shoulder pain, and found to have a diagnosis of left cuff arthropathy.  They were brought to the operating room on 01/30/2016 and underwent the above named procedures.    The patient had an uncomplicated hospital course and was stable for discharge.  Recent vital signs:  Vitals:   01/31/16 0027 01/31/16 0417  BP: (!) 141/50 (!) 101/52  Pulse: (!) 105 (!) 102  Resp:    Temp: 98.7 F (37.1 C) 98.8 F (37.1 C)    Recent laboratory studies:  Results for orders placed or performed during the hospital encounter of 01/30/16  Surgical pcr screen  Result Value Ref Range   MRSA, PCR NEGATIVE NEGATIVE   Staphylococcus aureus NEGATIVE NEGATIVE  CBC  Result Value Ref Range   WBC 11.6 (H) 4.0 - 10.5 K/uL   RBC 5.38 4.22 - 5.81 MIL/uL   Hemoglobin 17.4 (H) 13.0 - 17.0 g/dL   HCT 48.8 39.0 - 52.0 %   MCV 90.7 78.0 - 100.0 fL   MCH 32.3 26.0 - 34.0 pg   MCHC 35.7 30.0 - 36.0 g/dL   RDW 13.0 11.5 - 15.5 %   Platelets 212 150 - 400 K/uL  Basic metabolic panel  Result Value Ref Range   Sodium 140 135 - 145 mmol/L   Potassium 4.0 3.5 - 5.1 mmol/L   Chloride 103 101 - 111 mmol/L   CO2 28 22 - 32 mmol/L   Glucose, Bld 122 (H) 65 - 99 mg/dL   BUN 6 6 - 20 mg/dL   Creatinine, Ser 1.01 0.61 - 1.24 mg/dL   Calcium 9.2 8.9 - 10.3 mg/dL   GFR calc non Af Amer >60 >60 mL/min   GFR calc Af Amer >60 >60 mL/min   Anion gap 9 5 - 15  Hemoglobin and hematocrit, blood  Result Value Ref Range     Hemoglobin 13.6 13.0 - 17.0 g/dL   HCT 40.5 39.0 - [id number] %  Basic metabolic panel  Result Value Ref Range   Sodium 137 135 - 145 mmol/L   Potassium 3.3 (L) 3.5 - 5.1 mmol/L   Chloride 101 101 - 111 mmol/L   CO2 28 22 - 32 mmol/L   Glucose, Bld 119 (H) 65 - 99 mg/dL   BUN 7 6 - 20 mg/dL   Creatinine, Ser 1.00 0.61 - 1.24 mg/dL   Calcium 8.2 (L) 8.9 - 10.3 mg/dL   GFR calc non Af Amer >60 >60 mL/min   GFR calc Af Amer >60 >60 mL/min   Anion gap 8 5 - 15    Discharge Medications:     Medication List    TAKE these medications   albuterol (2.5 MG/3ML) 0.083% nebulizer solution Commonly known as:  PROVENTIL Take 2.5 mg by nebulization every 6 (six) hours as needed for wheezing or shortness of breath.   PROAIR HFA 108 (90 Base) MCG/ACT inhaler Generic drug:  albuterol Inhale 2 puffs into the lungs every 6 (six) hours as needed for wheezing or shortness of breath.   aspirin EC  81 MG tablet Take 81 mg by mouth at bedtime.   atorvastatin 20 MG tablet Commonly known as:  LIPITOR Take 20 mg by mouth at bedtime.   AXIRON 30 MG/ACT Soln Generic drug:  Testosterone Place 90 mg onto the skin daily. 2 pumps under one arm & 1 pump under other arm=3 pumps daily   B-complex with vitamin C tablet Take 1 tablet by mouth daily.   budesonide-formoterol 160-4.5 MCG/ACT inhaler Commonly known as:  SYMBICORT Inhale 2 puffs into the lungs 2 (two) times daily.   DULoxetine 30 MG capsule Commonly known as:  CYMBALTA Take 30 mg by mouth daily. Take with a 60 mg capsule for a 90 mg dose   DULoxetine 60 MG capsule Commonly known as:  CYMBALTA Take 60 mg by mouth daily. Take with a 30 mg capsule for a 90 mg dose   esomeprazole 40 MG capsule Commonly known as:  NEXIUM Take 40 mg by mouth daily.   Fish Oil 1000 MG Caps Take 1,000 mg by mouth daily.   fluticasone 50 MCG/ACT nasal spray Commonly known as:  FLONASE Place 1 spray into both nostrils daily as needed for allergies.    ipratropium-albuterol 0.5-2.5 (3) MG/3ML Soln Commonly known as:  DUONEB Take 3 mLs by nebulization every 6 (six) hours as needed. What changed:  reasons to take this   lisinopril 5 MG tablet Commonly known as:  PRINIVIL,ZESTRIL Take 5 mg by mouth at bedtime.   methocarbamol 500 MG tablet Commonly known as:  ROBAXIN Take 1 tablet (500 mg total) by mouth 3 (three) times daily as needed.   multivitamin with minerals Tabs tablet Take 1 tablet by mouth at bedtime.   nicotine 21 mg/24hr patch Commonly known as:  EQ NICOTINE Place 1 patch (21 mg total) onto the skin daily.   oxyCODONE-acetaminophen 7.5-325 MG tablet Commonly known as:  PERCOCET Take 1-2 tablets by mouth every 4 (four) hours as needed for severe pain. What changed:  You were already taking a medication with the same name, and this prescription was added. Make sure you understand how and when to take each.   oxyCODONE-acetaminophen 5-325 MG tablet Commonly known as:  PERCOCET/ROXICET Take 1-2 tablets by mouth every 4 (four) hours as needed for severe pain. What changed:  Another medication with the same name was added. Make sure you understand how and when to take each.   tamsulosin 0.4 MG Caps capsule Commonly known as:  FLOMAX Take 0.4 mg by mouth 2 (two) times daily.   vitamin C 500 MG tablet Commonly known as:  ASCORBIC ACID Take 500 mg by mouth daily.       Diagnostic Studies: Dg Shoulder Left Port  Result Date: 01/30/2016 CLINICAL DATA:  Status post shoulder replacement. EXAM: LEFT SHOULDER - 1 VIEW COMPARISON:  None. FINDINGS: Single portable view of left shoulder demonstrates arthroplasty changes. No acute hardware complication or complicating fracture. Visualized portion of the left hemithorax is normal. IMPRESSION: Expected appearance after left shoulder arthroplasty. Electronically Signed   By: Abigail Miyamoto M.D.   On: 01/30/2016 10:46    Disposition: 01-Home or Self Care    Follow-up  Information    NORRIS,STEVEN R, MD. Call in 2 week(s).   Specialty:  Orthopedic Surgery Why:  [id number] Contact information: [address] [place] [address] [id number]            Signed: Ventura Bruns 02/02/2016, 6:55 PM

## 2016-02-11 DIAGNOSIS — Z96612 Presence of left artificial shoulder joint: Secondary | ICD-10-CM | POA: Diagnosis not present

## 2016-02-11 DIAGNOSIS — Z471 Aftercare following joint replacement surgery: Secondary | ICD-10-CM | POA: Diagnosis not present

## 2016-02-11 DIAGNOSIS — M12812 Other specific arthropathies, not elsewhere classified, left shoulder: Secondary | ICD-10-CM | POA: Diagnosis not present

## 2016-02-19 ENCOUNTER — Other Ambulatory Visit (HOSPITAL_COMMUNITY): Payer: Self-pay

## 2016-03-10 DIAGNOSIS — Z96612 Presence of left artificial shoulder joint: Secondary | ICD-10-CM | POA: Diagnosis not present

## 2016-03-10 DIAGNOSIS — Z471 Aftercare following joint replacement surgery: Secondary | ICD-10-CM | POA: Diagnosis not present

## 2016-03-12 DIAGNOSIS — Z96612 Presence of left artificial shoulder joint: Secondary | ICD-10-CM | POA: Diagnosis not present

## 2016-03-12 DIAGNOSIS — Z471 Aftercare following joint replacement surgery: Secondary | ICD-10-CM | POA: Diagnosis not present

## 2016-03-18 ENCOUNTER — Other Ambulatory Visit: Payer: Self-pay | Admitting: Physician Assistant

## 2016-03-18 DIAGNOSIS — Z96612 Presence of left artificial shoulder joint: Principal | ICD-10-CM

## 2016-03-18 DIAGNOSIS — Z471 Aftercare following joint replacement surgery: Secondary | ICD-10-CM

## 2016-03-23 ENCOUNTER — Ambulatory Visit
Admission: RE | Admit: 2016-03-23 | Discharge: 2016-03-23 | Disposition: A | Discharge status: Home / Self Care | Payer: Medicare Other | Source: Ambulatory Visit | Attending: Physician Assistant | Admitting: Physician Assistant

## 2016-03-23 DIAGNOSIS — M25512 Pain in left shoulder: Secondary | ICD-10-CM | POA: Diagnosis not present

## 2016-03-23 DIAGNOSIS — Z471 Aftercare following joint replacement surgery: Secondary | ICD-10-CM

## 2016-03-23 DIAGNOSIS — Z96612 Presence of left artificial shoulder joint: Principal | ICD-10-CM

## 2016-03-23 LAB — SYNOVIAL CELL COUNT + DIFF, W/ CRYSTALS
Basophils, %: 0 %
Eosinophils-Synovial: 0 % (ref 0–2)
LYMPHOCYTES-SYNOVIAL FLD: 16 % (ref 0–74)
Monocyte/Macrophage: 0 % (ref 0–69)
Neutrophil, Synovial: 82 % — ABNORMAL HIGH (ref 0–24)
Synoviocytes, %: 2 % (ref 0–15)
WBC, SYNOVIAL: 660 {cells}/uL — AB (ref <150)

## 2016-03-23 IMAGING — XA DG FLUORO GUIDE NDL PLC/BX
1 series · 1 of 1 positions shown · non-contrast
Comparison: none

CLINICAL DATA: Shoulder arthroplasty 6 weeks ago. Pain, redness and
swelling has developed.

[Series 1: ortho standard · 1 of 1 slices shown]
[im 1/1]
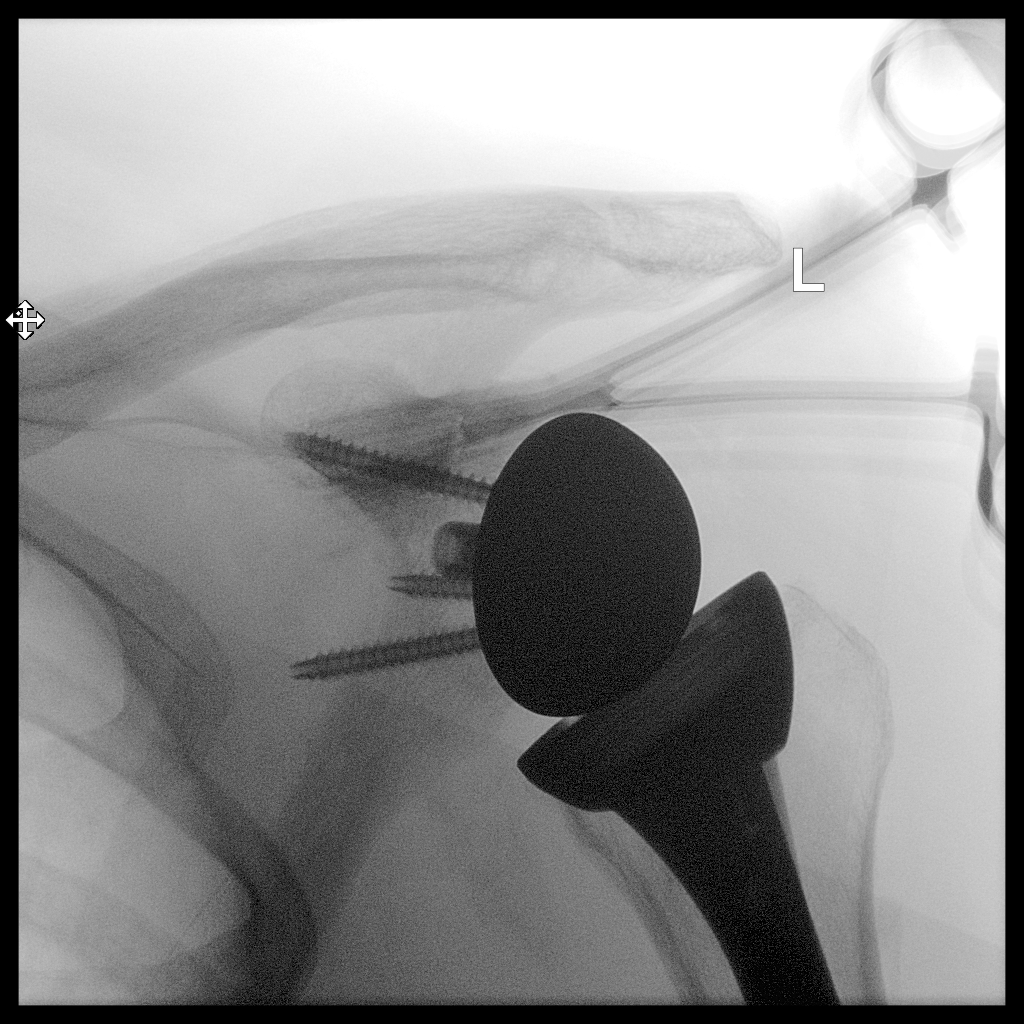

[1 of 1 positions shown; findings below may reference images not displayed]

FLUOROSCOPY TIME:  0 minutes 23 seconds. 37.16 micro gray meter
squared

PROCEDURE:
Left shoulder aspiration UNDER FLUOROSCOPY

An appropriate skin entrance site was determined. The site was
marked, prepped with Betadine, draped in the usual sterile fashion,
and infiltrated locally with 1% lidocaine. Under fluoroscopic
guidance, a 20 gauge needle was directed into the prosthetic
glenohumeral articulation. Approximately 10 cc of cloudy bloody
fluid were easily aspirated. Sample was submitted for requested
studies. The procedure was well-tolerated.
IMPRESSION: Technically successful left shoulder joint aspiration.

## 2016-03-30 ENCOUNTER — Encounter (HOSPITAL_COMMUNITY): Payer: Self-pay | Admitting: *Deleted

## 2016-03-30 DIAGNOSIS — Z96612 Presence of left artificial shoulder joint: Secondary | ICD-10-CM | POA: Diagnosis not present

## 2016-03-30 DIAGNOSIS — Z471 Aftercare following joint replacement surgery: Secondary | ICD-10-CM | POA: Diagnosis not present

## 2016-03-30 NOTE — H&P (Signed)
Frederick Guzman is an 66 y.o. male.    Chief Complaint: left shoulder pain  HPI: Pt is a 66 y.o. male complaining of left shoulder pain for multiple years. Pain had continually increased since the beginning. Labs suggest infection to left shoulder s/p replacement. Pt has tried various conservative treatments which have failed to alleviate their symptoms.. Various options are discussed with the patient. Risks, benefits and expectations were discussed with the patient. Patient understand the risks, benefits and expectations and wishes to proceed with surgery.   PCP:  Frederick, JULIE M, NP  D/C Plans: Home  PMH: Past Medical History:  Diagnosis Date  . Arthritis    OA  . Asthma   . Cancer ([place]) 08/2014   melanoma on R shoulder & back - [facility]   . Depression    pt. denies- 01/2016  . Fibromyalgia   . GERD (gastroesophageal reflux disease)   . HTN (hypertension)   . Pneumonia 06/2015  . Sleep apnea    last study - Nov. 2017, waiting for a new machine, using the old CPAP q night after cleaning the device after he was documented to have pneumonia from his CPAP machine.     PSH: Past Surgical History:  Procedure Laterality Date  . REVERSE SHOULDER ARTHROPLASTY Left 01/30/2016   Procedure: LEFT REVERSE SHOULDER ARTHROPLASTY;  Surgeon: Netta Cedars, MD;  Location: [facility];  Service: Orthopedics;  Laterality: Left;  . SHOULDER SURGERY Left 2017  . WRIST SURGERY Right 11/2014   Dr. Amedeo Plenty    Social History:  reports that he has been smoking.  He has a 50.00 pack-year smoking history. He has never used smokeless tobacco. He reports that he drinks about 5.4 oz of alcohol per week . He reports that he does not use drugs.  Allergies:  Allergies  Allergen Reactions  . Methotrexate Derivatives Nausea And Vomiting and Other (See Comments)    Mood change  . Ultram [Tramadol Hcl] Nausea And Vomiting    Medications: No current facility-administered medications for this encounter.     Current Outpatient Prescriptions  Medication Sig Dispense Refill  . albuterol (PROAIR HFA) 108 (90 Base) MCG/ACT inhaler Inhale 2 puffs into the lungs every 6 (six) hours as needed for wheezing or shortness of breath.    Marland Kitchen aspirin EC 81 MG tablet Take 81 mg by mouth at bedtime.    Marland Kitchen atorvastatin (LIPITOR) 20 MG tablet Take 20 mg by mouth at bedtime.  0  . B Complex-C (B-COMPLEX WITH VITAMIN C) tablet Take 1 tablet by mouth daily.    . budesonide-formoterol (SYMBICORT) 160-4.5 MCG/ACT inhaler Inhale 2 puffs into the lungs 2 (two) times daily.    . DULoxetine (CYMBALTA) 30 MG capsule Take 30 mg by mouth daily. Take with a 60 mg capsule for a 90 mg dose  0  . DULoxetine (CYMBALTA) 60 MG capsule Take 60 mg by mouth daily. Take with a 30 mg capsule for a 90 mg dose    . esomeprazole (NEXIUM) 20 MG capsule Take 20 mg by mouth daily at 12 noon.    . fluticasone (FLONASE) 50 MCG/ACT nasal spray Place 1 spray into both nostrils daily as needed for allergies.  1  . ipratropium-albuterol (DUONEB) 0.5-2.5 (3) MG/3ML SOLN Take 3 mLs by nebulization every 6 (six) hours as needed. (Patient taking differently: Take 3 mLs by nebulization every 6 (six) hours as needed (for wheezing/shortness of breath). ) 360 mL 1  . lisinopril (PRINIVIL,ZESTRIL) 5 MG tablet Take  5 mg by mouth at bedtime.     . Multiple Vitamin (MULTIVITAMIN WITH MINERALS) TABS tablet Take 1 tablet by mouth at bedtime.    . Omega-3 Fatty Acids (FISH OIL) 1000 MG CAPS Take 1,000 mg by mouth daily.    Marland Kitchen oxyCODONE-acetaminophen (PERCOCET) 7.5-325 MG tablet Take 1-2 tablets by mouth every 4 (four) hours as needed for severe pain. 40 tablet 0  . Tamsulosin HCl (FLOMAX) 0.4 MG CAPS Take 0.4 mg by mouth 2 (two) times daily.     . Testosterone (AXIRON) 30 MG/ACT SOLN Place 90 mg onto the skin daily. 2 pumps under one arm & 1 pump under other arm=3 pumps daily    . vitamin C (ASCORBIC ACID) 500 MG tablet Take 500 mg by mouth daily.    . methocarbamol  (ROBAXIN) 500 MG tablet Take 1 tablet (500 mg total) by mouth 3 (three) times daily as needed. (Patient not taking: Reported on 03/30/2016) 60 tablet 1    No results found for this or any previous visit (from the past 48 hour(s)). No results found.  ROS: Pain with rom of the left upper extremity  Physical Exam:  Alert and oriented 65 y.o. male in no acute distress Cranial nerves 2-12 intact Cervical spine: full rom with no tenderness, nv intact distally Chest: active breath sounds bilaterally, no wheeze rhonchi or rales Heart: regular rate and rhythm, no murmur Abd: non tender non distended with active bowel sounds Hip is stable with rom  Left shoulder mild pain with rom Trace effusion nv intact distally No rashes or edema  Assessment/Plan Assessment: left shoulder infection s/p replacement  Plan: Patient will undergo a left shoulder I&D with poly exchange by Dr. Veverly Fells at [facility]. Risks benefits and expectations were discussed with the patient. Patient understand risks, benefits and expectations and wishes to proceed.

## 2016-03-31 ENCOUNTER — Observation Stay (HOSPITAL_COMMUNITY)
Admission: RE | Admit: 2016-03-31 | Discharge: 2016-04-01 | Disposition: A | Discharge status: Home / Self Care | Payer: Medicare Other | Source: Ambulatory Visit | Attending: Orthopedic Surgery | Admitting: Orthopedic Surgery

## 2016-03-31 ENCOUNTER — Encounter (HOSPITAL_COMMUNITY)
Admission: RE | Disposition: A | Discharge status: Home / Self Care | Payer: Self-pay | Source: Ambulatory Visit | Attending: Orthopedic Surgery

## 2016-03-31 ENCOUNTER — Inpatient Hospital Stay (HOSPITAL_COMMUNITY): Payer: Medicare Other | Admitting: Anesthesiology

## 2016-03-31 DIAGNOSIS — F329 Major depressive disorder, single episode, unspecified: Secondary | ICD-10-CM | POA: Insufficient documentation

## 2016-03-31 DIAGNOSIS — Z79899 Other long term (current) drug therapy: Secondary | ICD-10-CM | POA: Diagnosis not present

## 2016-03-31 DIAGNOSIS — Y838 Other surgical procedures as the cause of abnormal reaction of the patient, or of later complication, without mention of misadventure at the time of the procedure: Secondary | ICD-10-CM | POA: Insufficient documentation

## 2016-03-31 DIAGNOSIS — T8459XA Infection and inflammatory reaction due to other internal joint prosthesis, initial encounter: Principal | ICD-10-CM | POA: Insufficient documentation

## 2016-03-31 DIAGNOSIS — I252 Old myocardial infarction: Secondary | ICD-10-CM | POA: Insufficient documentation

## 2016-03-31 DIAGNOSIS — M12812 Other specific arthropathies, not elsewhere classified, left shoulder: Secondary | ICD-10-CM | POA: Diagnosis not present

## 2016-03-31 DIAGNOSIS — K219 Gastro-esophageal reflux disease without esophagitis: Secondary | ICD-10-CM | POA: Diagnosis not present

## 2016-03-31 DIAGNOSIS — Z7951 Long term (current) use of inhaled steroids: Secondary | ICD-10-CM | POA: Insufficient documentation

## 2016-03-31 DIAGNOSIS — T814XXA Infection following a procedure, initial encounter: Secondary | ICD-10-CM | POA: Diagnosis not present

## 2016-03-31 DIAGNOSIS — I2510 Atherosclerotic heart disease of native coronary artery without angina pectoris: Secondary | ICD-10-CM | POA: Diagnosis not present

## 2016-03-31 DIAGNOSIS — G4730 Sleep apnea, unspecified: Secondary | ICD-10-CM | POA: Insufficient documentation

## 2016-03-31 DIAGNOSIS — Z96612 Presence of left artificial shoulder joint: Secondary | ICD-10-CM | POA: Insufficient documentation

## 2016-03-31 DIAGNOSIS — Z7982 Long term (current) use of aspirin: Secondary | ICD-10-CM | POA: Insufficient documentation

## 2016-03-31 DIAGNOSIS — J45909 Unspecified asthma, uncomplicated: Secondary | ICD-10-CM | POA: Diagnosis not present

## 2016-03-31 DIAGNOSIS — T8189XA Other complications of procedures, not elsewhere classified, initial encounter: Secondary | ICD-10-CM | POA: Diagnosis not present

## 2016-03-31 DIAGNOSIS — T847XXA Infection and inflammatory reaction due to other internal orthopedic prosthetic devices, implants and grafts, initial encounter: Secondary | ICD-10-CM | POA: Diagnosis present

## 2016-03-31 DIAGNOSIS — G4733 Obstructive sleep apnea (adult) (pediatric): Secondary | ICD-10-CM | POA: Diagnosis not present

## 2016-03-31 DIAGNOSIS — I10 Essential (primary) hypertension: Secondary | ICD-10-CM | POA: Insufficient documentation

## 2016-03-31 DIAGNOSIS — F17210 Nicotine dependence, cigarettes, uncomplicated: Secondary | ICD-10-CM | POA: Insufficient documentation

## 2016-03-31 DIAGNOSIS — M19012 Primary osteoarthritis, left shoulder: Secondary | ICD-10-CM | POA: Diagnosis not present

## 2016-03-31 DIAGNOSIS — M25312 Other instability, left shoulder: Secondary | ICD-10-CM | POA: Diagnosis not present

## 2016-03-31 DIAGNOSIS — M25512 Pain in left shoulder: Secondary | ICD-10-CM | POA: Diagnosis present

## 2016-03-31 HISTORY — PX: IRRIGATION AND DEBRIDEMENT SHOULDER: SHX5880

## 2016-03-31 HISTORY — DX: Dyspnea, unspecified: R06.00

## 2016-03-31 HISTORY — DX: Hyperlipidemia, unspecified: E78.5

## 2016-03-31 HISTORY — DX: Chronic obstructive pulmonary disease, unspecified: J44.9

## 2016-03-31 LAB — BASIC METABOLIC PANEL
Anion gap: 8 (ref 5–15)
BUN: 7 mg/dL (ref 6–20)
CO2: 25 mmol/L (ref 22–32)
Calcium: 9.1 mg/dL (ref 8.9–10.3)
Chloride: 106 mmol/L (ref 101–111)
Creatinine, Ser: 0.79 mg/dL (ref 0.61–1.24)
GFR calc Af Amer: >60 mL/min (ref >60)
GFR calc non Af Amer: >60 mL/min (ref >60)
Glucose, Bld: 96 mg/dL (ref 65–99)
Potassium: 4.2 mmol/L (ref 3.5–5.1)
Sodium: 139 mmol/L (ref 135–145)

## 2016-03-31 LAB — CBC
HCT: 45.9 % (ref 39.0–52.0)
Hemoglobin: 15.7 g/dL (ref 13.0–17.0)
MCH: 30.7 pg (ref 26.0–34.0)
MCHC: 34.2 g/dL (ref 30.0–36.0)
MCV: 89.8 fL (ref 78.0–100.0)
Platelets: 257 10*3/uL (ref 150–400)
RBC: 5.11 MIL/uL (ref 4.22–5.81)
RDW: 14.2 % (ref 11.5–15.5)
WBC: 8.8 10*3/uL (ref 4.0–10.5)

## 2016-03-31 SURGERY — IRRIGATION AND DEBRIDEMENT SHOULDER
Anesthesia: General | Site: Shoulder | Laterality: Left

## 2016-03-31 MED ORDER — OMEGA-3-ACID ETHYL ESTERS 1 G PO CAPS
2.0000 g | ORAL_CAPSULE | Freq: Every day | ORAL | Status: DC
  Administered 2016-04-01: 2 g via ORAL
  Filled 2016-03-31: qty 2

## 2016-03-31 MED ORDER — DOCUSATE SODIUM 100 MG PO CAPS
100.0000 mg | ORAL_CAPSULE | Freq: Two times a day (BID) | ORAL | Status: DC
  Administered 2016-03-31 – 2016-04-01 (×2): 100 mg via ORAL
  Filled 2016-03-31 (×2): qty 1

## 2016-03-31 MED ORDER — 0.9 % SODIUM CHLORIDE (POUR BTL) OPTIME
TOPICAL | Status: DC | PRN
  Administered 2016-03-31: 1000 mL

## 2016-03-31 MED ORDER — METHOCARBAMOL 500 MG PO TABS: 500.0000 mg | ORAL_TABLET | Freq: Four times a day (QID) | ORAL | Status: DC | PRN

## 2016-03-31 MED ORDER — ONDANSETRON HCL 4 MG PO TABS: 4.0000 mg | ORAL_TABLET | Freq: Four times a day (QID) | ORAL | Status: DC | PRN

## 2016-03-31 MED ORDER — CEFAZOLIN SODIUM 1 G IJ SOLR
INTRAMUSCULAR | Status: DC | PRN
  Administered 2016-03-31: 2 g via INTRAMUSCULAR

## 2016-03-31 MED ORDER — OXYCODONE-ACETAMINOPHEN 7.5-325 MG PO TABS
1.0000 | ORAL_TABLET | ORAL | Status: DC | PRN
  Administered 2016-04-01 (×2): 2 via ORAL
  Administered 2016-04-01 (×2): 1 via ORAL
  Filled 2016-03-31 (×3): qty 2

## 2016-03-31 MED ORDER — HYDROMORPHONE HCL 1 MG/ML IJ SOLN: 1.0000 mg | INTRAMUSCULAR | Status: DC | PRN

## 2016-03-31 MED ORDER — ALBUTEROL SULFATE HFA 108 (90 BASE) MCG/ACT IN AERS
INHALATION_SPRAY | RESPIRATORY_TRACT | Status: DC | PRN
  Administered 2016-03-31: 4 via RESPIRATORY_TRACT

## 2016-03-31 MED ORDER — DULOXETINE HCL 60 MG PO CPEP: 60.0000 mg | ORAL_CAPSULE | Freq: Every day | ORAL | Status: DC

## 2016-03-31 MED ORDER — ONDANSETRON HCL 4 MG/2ML IJ SOLN: 4.0000 mg | Freq: Four times a day (QID) | INTRAMUSCULAR | Status: DC | PRN

## 2016-03-31 MED ORDER — PHENOL 1.4 % MT LIQD
1.0000 | OROMUCOSAL | Status: DC | PRN
Start: 2016-03-31 — End: 2016-04-01

## 2016-03-31 MED ORDER — HYDROMORPHONE HCL 1 MG/ML IJ SOLN
0.2500 mg | INTRAMUSCULAR | Status: DC | PRN
  Administered 2016-03-31 (×3): 0.5 mg via INTRAVENOUS

## 2016-03-31 MED ORDER — MOMETASONE FURO-FORMOTEROL FUM 200-5 MCG/ACT IN AERO
2.0000 | INHALATION_SPRAY | Freq: Two times a day (BID) | RESPIRATORY_TRACT | Status: DC
  Administered 2016-03-31 – 2016-04-01 (×2): 2 via RESPIRATORY_TRACT
  Filled 2016-03-31: qty 8.8

## 2016-03-31 MED ORDER — FENTANYL CITRATE (PF) 100 MCG/2ML IJ SOLN
INTRAMUSCULAR | Status: AC
  Filled 2016-03-31: qty 2

## 2016-03-31 MED ORDER — ADULT MULTIVITAMIN W/MINERALS CH
1.0000 | ORAL_TABLET | Freq: Every day | ORAL | Status: DC
  Administered 2016-03-31: 1 via ORAL
  Filled 2016-03-31: qty 1

## 2016-03-31 MED ORDER — AMOXICILLIN 500 MG PO CAPS
500.0000 mg | ORAL_CAPSULE | Freq: Three times a day (TID) | ORAL | Status: DC
  Administered 2016-03-31 – 2016-04-01 (×3): 500 mg via ORAL
  Filled 2016-03-31 (×3): qty 1

## 2016-03-31 MED ORDER — PHENYLEPHRINE HCL 10 MG/ML IJ SOLN
INTRAVENOUS | Status: DC | PRN
  Administered 2016-03-31: 25 ug/min via INTRAVENOUS

## 2016-03-31 MED ORDER — LISINOPRIL 5 MG PO TABS
5.0000 mg | ORAL_TABLET | Freq: Every day | ORAL | Status: DC
  Administered 2016-03-31: 5 mg via ORAL
  Filled 2016-03-31: qty 1

## 2016-03-31 MED ORDER — EPINEPHRINE PF 1 MG/ML IJ SOLN
INTRAMUSCULAR | Status: AC
  Filled 2016-03-31: qty 1

## 2016-03-31 MED ORDER — PROPOFOL 10 MG/ML IV BOLUS
INTRAVENOUS | Status: AC
  Filled 2016-03-31: qty 40

## 2016-03-31 MED ORDER — METHOCARBAMOL 500 MG PO TABS
500.0000 mg | ORAL_TABLET | Freq: Four times a day (QID) | ORAL | Status: DC | PRN
  Administered 2016-03-31 – 2016-04-01 (×2): 500 mg via ORAL
  Filled 2016-03-31 (×2): qty 1

## 2016-03-31 MED ORDER — METOCLOPRAMIDE HCL 5 MG/ML IJ SOLN: 5.0000 mg | Freq: Three times a day (TID) | INTRAMUSCULAR | Status: DC | PRN

## 2016-03-31 MED ORDER — SODIUM CHLORIDE 0.9 % IR SOLN
Status: DC | PRN
  Administered 2016-03-31: 500 mL

## 2016-03-31 MED ORDER — TAMSULOSIN HCL 0.4 MG PO CAPS
0.4000 mg | ORAL_CAPSULE | Freq: Two times a day (BID) | ORAL | Status: DC
  Administered 2016-03-31 – 2016-04-01 (×2): 0.4 mg via ORAL
  Filled 2016-03-31 (×2): qty 1

## 2016-03-31 MED ORDER — ACETAMINOPHEN 325 MG PO TABS: 650.0000 mg | ORAL_TABLET | Freq: Four times a day (QID) | ORAL | Status: DC | PRN

## 2016-03-31 MED ORDER — RIFAMPIN 300 MG PO CAPS: 300.0000 mg | ORAL_CAPSULE | Freq: Two times a day (BID) | ORAL | 1 refills | Status: DC

## 2016-03-31 MED ORDER — ASPIRIN EC 81 MG PO TBEC
81.0000 mg | DELAYED_RELEASE_TABLET | Freq: Every day | ORAL | Status: DC
  Administered 2016-03-31: 81 mg via ORAL
  Filled 2016-03-31: qty 1

## 2016-03-31 MED ORDER — MIDAZOLAM HCL 2 MG/2ML IJ SOLN
INTRAMUSCULAR | Status: AC
  Filled 2016-03-31: qty 2

## 2016-03-31 MED ORDER — PROMETHAZINE HCL 25 MG/ML IJ SOLN
6.2500 mg | INTRAMUSCULAR | Status: DC | PRN
  Administered 2016-03-31: 6.25 mg via INTRAVENOUS

## 2016-03-31 MED ORDER — METHOCARBAMOL 500 MG PO TABS
ORAL_TABLET | ORAL | Status: AC
  Filled 2016-03-31: qty 1

## 2016-03-31 MED ORDER — ALBUTEROL SULFATE (2.5 MG/3ML) 0.083% IN NEBU: 2.5000 mg | INHALATION_SOLUTION | Freq: Four times a day (QID) | RESPIRATORY_TRACT | Status: DC | PRN

## 2016-03-31 MED ORDER — HYDROMORPHONE HCL 2 MG/ML IJ SOLN
INTRAMUSCULAR | Status: AC
  Administered 2016-03-31: 1 mg
  Filled 2016-03-31: qty 1

## 2016-03-31 MED ORDER — BUPIVACAINE HCL 0.25 % IJ SOLN
INTRAMUSCULAR | Status: DC | PRN
  Administered 2016-03-31: 10 mL

## 2016-03-31 MED ORDER — TESTOSTERONE 30 MG/ACT TD SOLN: 90.0000 mg | Freq: Every day | TRANSDERMAL | Status: DC

## 2016-03-31 MED ORDER — MIDAZOLAM HCL 5 MG/5ML IJ SOLN
INTRAMUSCULAR | Status: DC | PRN
  Administered 2016-03-31: 2 mg via INTRAVENOUS

## 2016-03-31 MED ORDER — LACTATED RINGERS IV SOLN
INTRAVENOUS | Status: DC | PRN
  Administered 2016-03-31 (×2): via INTRAVENOUS

## 2016-03-31 MED ORDER — LACTATED RINGERS IV SOLN
INTRAVENOUS | Status: DC
  Administered 2016-03-31: 13:00:00 via INTRAVENOUS

## 2016-03-31 MED ORDER — FLUTICASONE PROPIONATE 50 MCG/ACT NA SUSP: 1.0000 | Freq: Every day | NASAL | Status: DC | PRN

## 2016-03-31 MED ORDER — HYDROMORPHONE HCL 2 MG/ML IJ SOLN
1.0000 mg | INTRAMUSCULAR | Status: DC | PRN
  Administered 2016-03-31 – 2016-04-01 (×4): 2 mg via INTRAVENOUS
  Filled 2016-03-31 (×4): qty 1

## 2016-03-31 MED ORDER — FENTANYL CITRATE (PF) 100 MCG/2ML IJ SOLN
INTRAMUSCULAR | Status: DC | PRN
  Administered 2016-03-31: 100 ug via INTRAVENOUS
  Administered 2016-03-31 (×3): 50 ug via INTRAVENOUS

## 2016-03-31 MED ORDER — PROMETHAZINE HCL 25 MG/ML IJ SOLN
INTRAMUSCULAR | Status: AC
  Filled 2016-03-31: qty 1

## 2016-03-31 MED ORDER — RIFAMPIN 300 MG PO CAPS
300.0000 mg | ORAL_CAPSULE | Freq: Two times a day (BID) | ORAL | Status: DC
  Administered 2016-03-31 – 2016-04-01 (×2): 300 mg via ORAL
  Filled 2016-03-31 (×2): qty 1

## 2016-03-31 MED ORDER — NICOTINE 21 MG/24HR TD PT24
21.0000 mg | MEDICATED_PATCH | Freq: Every day | TRANSDERMAL | Status: DC
  Administered 2016-04-01: 21 mg via TRANSDERMAL
  Filled 2016-03-31: qty 1

## 2016-03-31 MED ORDER — PANTOPRAZOLE SODIUM 40 MG PO TBEC
80.0000 mg | DELAYED_RELEASE_TABLET | Freq: Every day | ORAL | Status: DC
  Administered 2016-04-01: 80 mg via ORAL
  Filled 2016-03-31: qty 2

## 2016-03-31 MED ORDER — SENNOSIDES-DOCUSATE SODIUM 8.6-50 MG PO TABS: 1.0000 | ORAL_TABLET | Freq: Every evening | ORAL | Status: DC | PRN

## 2016-03-31 MED ORDER — ROCURONIUM BROMIDE 10 MG/ML (PF) SYRINGE
PREFILLED_SYRINGE | INTRAVENOUS | Status: DC | PRN
  Administered 2016-03-31: 20 mg via INTRAVENOUS
  Administered 2016-03-31: 50 mg via INTRAVENOUS

## 2016-03-31 MED ORDER — HYDROMORPHONE HCL 1 MG/ML IJ SOLN
INTRAMUSCULAR | Status: AC
  Administered 2016-03-31: 0.5 mg via INTRAVENOUS
  Filled 2016-03-31: qty 0.5

## 2016-03-31 MED ORDER — PHENYLEPHRINE 40 MCG/ML (10ML) SYRINGE FOR IV PUSH (FOR BLOOD PRESSURE SUPPORT)
PREFILLED_SYRINGE | INTRAVENOUS | Status: DC | PRN
  Administered 2016-03-31 (×3): 80 ug via INTRAVENOUS
  Administered 2016-03-31 (×2): 120 ug via INTRAVENOUS

## 2016-03-31 MED ORDER — SODIUM CHLORIDE 0.9 % IR SOLN
Status: DC | PRN
  Administered 2016-03-31: 6000 mL

## 2016-03-31 MED ORDER — CHLORHEXIDINE GLUCONATE 4 % EX LIQD: 60.0000 mL | Freq: Once | CUTANEOUS | Status: DC

## 2016-03-31 MED ORDER — MENTHOL 3 MG MT LOZG: 1.0000 | LOZENGE | OROMUCOSAL | Status: DC | PRN

## 2016-03-31 MED ORDER — METHOCARBAMOL 500 MG PO TABS: 500.0000 mg | ORAL_TABLET | Freq: Three times a day (TID) | ORAL | 1 refills | Status: DC | PRN

## 2016-03-31 MED ORDER — ACETAMINOPHEN 650 MG RE SUPP: 650.0000 mg | Freq: Four times a day (QID) | RECTAL | Status: DC | PRN

## 2016-03-31 MED ORDER — VECURONIUM BROMIDE 10 MG IV SOLR: INTRAVENOUS | Status: DC | PRN

## 2016-03-31 MED ORDER — SUGAMMADEX SODIUM 200 MG/2ML IV SOLN
INTRAVENOUS | Status: DC | PRN
  Administered 2016-03-31: 200 mg via INTRAVENOUS

## 2016-03-31 MED ORDER — VITAMIN C 500 MG PO TABS
500.0000 mg | ORAL_TABLET | Freq: Every day | ORAL | Status: DC
  Administered 2016-04-01: 500 mg via ORAL
  Filled 2016-03-31: qty 1

## 2016-03-31 MED ORDER — BUPIVACAINE HCL (PF) 0.25 % IJ SOLN
INTRAMUSCULAR | Status: AC
  Filled 2016-03-31: qty 30

## 2016-03-31 MED ORDER — LIDOCAINE 2% (20 MG/ML) 5 ML SYRINGE
INTRAMUSCULAR | Status: DC | PRN
  Administered 2016-03-31: 100 mg via INTRAVENOUS

## 2016-03-31 MED ORDER — METHOCARBAMOL 1000 MG/10ML IJ SOLN
500.0000 mg | Freq: Four times a day (QID) | INTRAVENOUS | Status: DC | PRN
  Filled 2016-03-31: qty 5

## 2016-03-31 MED ORDER — IPRATROPIUM-ALBUTEROL 0.5-2.5 (3) MG/3ML IN SOLN: 3.0000 mL | Freq: Four times a day (QID) | RESPIRATORY_TRACT | Status: DC | PRN

## 2016-03-31 MED ORDER — METOCLOPRAMIDE HCL 5 MG PO TABS: 5.0000 mg | ORAL_TABLET | Freq: Three times a day (TID) | ORAL | Status: DC | PRN

## 2016-03-31 MED ORDER — ALBUMIN HUMAN 5 % IV SOLN
INTRAVENOUS | Status: DC | PRN
  Administered 2016-03-31 (×2): via INTRAVENOUS

## 2016-03-31 MED ORDER — PROPOFOL 10 MG/ML IV BOLUS
INTRAVENOUS | Status: DC | PRN
  Administered 2016-03-31: 150 mg via INTRAVENOUS
  Administered 2016-03-31: 50 mg via INTRAVENOUS

## 2016-03-31 MED ORDER — SODIUM CHLORIDE 0.9 % IV SOLN
INTRAVENOUS | Status: DC
  Administered 2016-03-31: via INTRAVENOUS

## 2016-03-31 MED ORDER — EPHEDRINE SULFATE-NACL 50-0.9 MG/10ML-% IV SOSY
PREFILLED_SYRINGE | INTRAVENOUS | Status: DC | PRN
  Administered 2016-03-31 (×4): 5 mg via INTRAVENOUS

## 2016-03-31 MED ORDER — DULOXETINE HCL 30 MG PO CPEP: 30.0000 mg | ORAL_CAPSULE | Freq: Every day | ORAL | Status: DC

## 2016-03-31 MED ORDER — AMOXICILLIN 500 MG PO CAPS: 500.0000 mg | ORAL_CAPSULE | Freq: Three times a day (TID) | ORAL | 1 refills | Status: DC

## 2016-03-31 MED ORDER — BISACODYL 10 MG RE SUPP: 10.0000 mg | Freq: Every day | RECTAL | Status: DC | PRN

## 2016-03-31 MED ORDER — HYDROMORPHONE HCL 1 MG/ML IJ SOLN
0.5000 mg | INTRAMUSCULAR | Status: AC | PRN
  Administered 2016-03-31 (×2): 0.5 mg via INTRAVENOUS

## 2016-03-31 MED ORDER — ONDANSETRON HCL 4 MG/2ML IJ SOLN
INTRAMUSCULAR | Status: DC | PRN
  Administered 2016-03-31: 4 mg via INTRAVENOUS

## 2016-03-31 MED ORDER — DULOXETINE HCL 60 MG PO CPEP
90.0000 mg | ORAL_CAPSULE | Freq: Every day | ORAL | Status: DC
  Administered 2016-04-01: 10:00:00 90 mg via ORAL
  Filled 2016-03-31: qty 1

## 2016-03-31 MED ORDER — HYDROMORPHONE HCL 2 MG/ML IJ SOLN
INTRAMUSCULAR | Status: AC
  Administered 2016-03-31: 0.5 mg
  Filled 2016-03-31: qty 1

## 2016-03-31 MED ORDER — CEFAZOLIN SODIUM 1 G IJ SOLR
INTRAMUSCULAR | Status: AC
  Filled 2016-03-31: qty 40

## 2016-03-31 MED ORDER — B COMPLEX-C PO TABS
1.0000 | ORAL_TABLET | Freq: Every day | ORAL | Status: DC
  Administered 2016-04-01: 1 via ORAL
  Filled 2016-03-31: qty 1

## 2016-03-31 MED ORDER — OXYCODONE-ACETAMINOPHEN 7.5-325 MG PO TABS: 1.0000 | ORAL_TABLET | ORAL | 0 refills | Status: DC | PRN

## 2016-03-31 MED ORDER — SODIUM CHLORIDE 0.9 % IJ SOLN
INTRAMUSCULAR | Status: AC
  Filled 2016-03-31: qty 10

## 2016-03-31 MED ORDER — ATORVASTATIN CALCIUM 20 MG PO TABS
20.0000 mg | ORAL_TABLET | Freq: Every day | ORAL | Status: DC
  Administered 2016-03-31: 20 mg via ORAL
  Filled 2016-03-31: qty 1

## 2016-03-31 SURGICAL SUPPLY — 86 items
BOWL SMART MIX CTS (DISPOSABLE) IMPLANT
BRUSH FEMORAL CANAL (MISCELLANEOUS) IMPLANT
BUR SURG 4X8 MED (BURR) IMPLANT
BURR SURG 4MMX8MM MEDIUM (BURR)
BURR SURG 4X8 MED (BURR)
CLOSURE WOUND 1/2 X4 (GAUZE/BANDAGES/DRESSINGS)
CONT SPEC 4OZ CLIKSEAL STRL BL (MISCELLANEOUS) ×3 IMPLANT
COVER SURGICAL LIGHT HANDLE (MISCELLANEOUS) ×4 IMPLANT
CUP STAND PE 42 PLUS 9MM (Orthopedic Implant) ×4 IMPLANT
CUP STD PE 42 PLUS 9MM (Orthopedic Implant) ×1 IMPLANT
DRAPE INCISE IOBAN 66X45 STRL (DRAPES) ×6 IMPLANT
DRAPE ORTHO SPLIT 77X108 STRL (DRAPES) ×8
DRAPE SURG ORHT 6 SPLT 77X108 (DRAPES) ×4 IMPLANT
DRAPE U-SHAPE 47X51 STRL (DRAPES) ×4 IMPLANT
DRAPE X-RAY CASS 24X20 (DRAPES) IMPLANT
DRILL BIT 5/64 (BIT) IMPLANT
DRSG ADAPTIC 3X8 NADH LF (GAUZE/BANDAGES/DRESSINGS) ×1 IMPLANT
DRSG PAD ABDOMINAL 8X10 ST (GAUZE/BANDAGES/DRESSINGS) ×5 IMPLANT
DURAPREP 26ML APPLICATOR (WOUND CARE) ×4 IMPLANT
ELECT BLADE 4.0 EZ CLEAN MEGAD (MISCELLANEOUS) ×4
ELECT NDL TIP 2.8 STRL (NEEDLE) ×1 IMPLANT
ELECT NEEDLE TIP 2.8 STRL (NEEDLE) ×4 IMPLANT
ELECT REM PT RETURN 9FT ADLT (ELECTROSURGICAL) ×4
ELECTRODE BLDE 4.0 EZ CLN MEGD (MISCELLANEOUS) ×2 IMPLANT
ELECTRODE REM PT RTRN 9FT ADLT (ELECTROSURGICAL) ×2 IMPLANT
EVACUATOR 1/8 PVC DRAIN (DRAIN) ×1 IMPLANT
GAUZE SPONGE 4X4 12PLY STRL (GAUZE/BANDAGES/DRESSINGS) ×1 IMPLANT
GLOVE BIOGEL PI ORTHO PRO 7.5 (GLOVE) ×4
GLOVE BIOGEL PI ORTHO PRO SZ8 (GLOVE) ×2
GLOVE ORTHO TXT STRL SZ7.5 (GLOVE) ×4 IMPLANT
GLOVE PI ORTHO PRO STRL 7.5 (GLOVE) ×4 IMPLANT
GLOVE PI ORTHO PRO STRL SZ8 (GLOVE) ×2 IMPLANT
GLOVE SURG ORTHO 8.5 STRL (GLOVE) ×4 IMPLANT
GOWN STRL REUS W/ TWL LRG LVL3 (GOWN DISPOSABLE) ×2 IMPLANT
GOWN STRL REUS W/ TWL XL LVL3 (GOWN DISPOSABLE) ×4 IMPLANT
GOWN STRL REUS W/TWL LRG LVL3 (GOWN DISPOSABLE) ×4
GOWN STRL REUS W/TWL XL LVL3 (GOWN DISPOSABLE) ×8
HANDPIECE INTERPULSE COAX TIP (DISPOSABLE) ×4
KIT BASIN OR (CUSTOM PROCEDURE TRAY) ×4 IMPLANT
KIT ROOM TURNOVER OR (KITS) ×4 IMPLANT
MANIFOLD NEPTUNE II (INSTRUMENTS) ×4 IMPLANT
NDL 1/2 CIR MAYO (NEEDLE) IMPLANT
NDL HYPO 25GX1X1/2 BEV (NEEDLE) ×1 IMPLANT
NDL SUT 6 .5 CRC .975X.05 MAYO (NEEDLE) ×1 IMPLANT
NEEDLE 1/2 CIR MAYO (NEEDLE) IMPLANT
NEEDLE HYPO 25GX1X1/2 BEV (NEEDLE) ×4 IMPLANT
NEEDLE MAYO TAPER (NEEDLE)
NS IRRIG 1000ML POUR BTL (IV SOLUTION) ×4 IMPLANT
PACK SHOULDER (CUSTOM PROCEDURE TRAY) ×4 IMPLANT
PAD ARMBOARD 7.5X6 YLW CONV (MISCELLANEOUS) ×8 IMPLANT
SET HNDPC FAN SPRY TIP SCT (DISPOSABLE) ×1 IMPLANT
SLING ARM IMMOBILIZER LRG (SOFTGOODS) IMPLANT
SLING ARM IMMOBILIZER MED (SOFTGOODS) IMPLANT
SPACER 42 PLUS 6 (Orthopedic Implant) ×3 IMPLANT
SPONGE GAUZE 4X4 12PLY STER LF (GAUZE/BANDAGES/DRESSINGS) ×3 IMPLANT
SPONGE LAP 18X18 X RAY DECT (DISPOSABLE) ×10 IMPLANT
SPONGE LAP 4X18 X RAY DECT (DISPOSABLE) ×4 IMPLANT
STRIP CLOSURE SKIN 1/2X4 (GAUZE/BANDAGES/DRESSINGS) ×1 IMPLANT
SUCTION FRAZIER HANDLE 10FR (MISCELLANEOUS)
SUCTION TUBE FRAZIER 10FR DISP (MISCELLANEOUS) ×1 IMPLANT
SUT ETHILON 2 0 FS 18 (SUTURE) ×9 IMPLANT
SUT FIBERWIRE #2 38 T-5 BLUE (SUTURE) ×4
SUT MNCRL AB 3-0 PS2 18 (SUTURE) ×1 IMPLANT
SUT MNCRL AB 4-0 PS2 18 (SUTURE) ×1 IMPLANT
SUT PDS AB 1 CT  36 (SUTURE) ×2
SUT PDS AB 1 CT 36 (SUTURE) ×1 IMPLANT
SUT VIC AB 0 CT1 27 (SUTURE) ×4
SUT VIC AB 0 CT1 27XBRD ANBCTR (SUTURE) ×2 IMPLANT
SUT VIC AB 2-0 CT1 27 (SUTURE) ×8
SUT VIC AB 2-0 CT1 TAPERPNT 27 (SUTURE) ×3 IMPLANT
SUT VICRYL AB 2 0 TIES (SUTURE) ×4 IMPLANT
SUTURE FIBERWR #2 38 T-5 BLUE (SUTURE) ×6 IMPLANT
SWAB COLLECTION DEVICE MRSA (MISCELLANEOUS) ×4 IMPLANT
SYR CONTROL 10ML LL (SYRINGE) ×4 IMPLANT
SYR TOOMEY 50ML (SYRINGE) ×1 IMPLANT
TAPE CLOTH SURG 6X10 WHT LF (GAUZE/BANDAGES/DRESSINGS) ×3 IMPLANT
TOWEL OR 17X24 6PK STRL BLUE (TOWEL DISPOSABLE) ×4 IMPLANT
TOWEL OR 17X26 10 PK STRL BLUE (TOWEL DISPOSABLE) ×4 IMPLANT
TOWER CARTRIDGE SMART MIX (DISPOSABLE) IMPLANT
TRAY FOLEY CATH 16FRSI W/METER (SET/KITS/TRAYS/PACK) IMPLANT
TUBE ANAEROBIC SPECIMEN COL (MISCELLANEOUS) ×3 IMPLANT
TUBE CONNECTING 12'X1/4 (SUCTIONS) ×1
TUBE CONNECTING 12X1/4 (SUCTIONS) ×3 IMPLANT
UNDERPAD 30X30 (UNDERPADS AND DIAPERS) ×1 IMPLANT
WATER STERILE IRR 1000ML POUR (IV SOLUTION) ×1 IMPLANT
YANKAUER SUCT BULB TIP NO VENT (SUCTIONS) ×4 IMPLANT

## 2016-03-31 NOTE — Anesthesia Preprocedure Evaluation (Addendum)
Anesthesia Evaluation  Patient identified by MRN, date of birth, ID band Patient awake    Reviewed: Allergy & Precautions, H&P , NPO status , Patient's Chart, lab work & pertinent test results  Airway Mallampati: III  TM Distance: >3 FB Neck ROM: Full    Dental no notable dental hx. (+) Teeth Intact, Dental Advisory Given   Pulmonary asthma , sleep apnea and Continuous Positive Airway Pressure Ventilation , Current Smoker,    Pulmonary exam normal breath sounds clear to auscultation       Cardiovascular hypertension, Pt. on medications  Rhythm:Regular Rate:Normal     Neuro/Psych Depression negative neurological ROS     GI/Hepatic Neg liver ROS, GERD  Medicated and Controlled,  Endo/Other  negative endocrine ROS  Renal/GU negative Renal ROS  negative genitourinary   Musculoskeletal  (+) Arthritis , Osteoarthritis,  Fibromyalgia -  Abdominal   Peds  Hematology negative hematology ROS (+)   Anesthesia Other Findings   Reproductive/Obstetrics negative OB ROS                             Anesthesia Physical  Anesthesia Plan  ASA: III  Anesthesia Plan: General   Post-op Pain Management: GA combined w/ Regional for post-op pain   Induction: Intravenous  Airway Management Planned: Oral ETT  Additional Equipment:   Intra-op Plan:   Post-operative Plan: Extubation in OR  Informed Consent: I have reviewed the patients History and Physical, chart, labs and discussed the procedure including the risks, benefits and alternatives for the proposed anesthesia with the patient or authorized representative who has indicated his/her understanding and acceptance.   Dental advisory given  Plan Discussed with: CRNA  Anesthesia Plan Comments: (Dr. Veverly Fells does not feel the patient will need an ISB.)       Anesthesia Quick Evaluation

## 2016-03-31 NOTE — Progress Notes (Signed)
Report received from David Rees, RN. 

## 2016-03-31 NOTE — Anesthesia Postprocedure Evaluation (Signed)
Anesthesia Post Note  Patient: Frederick Guzman  Procedure(s) Performed: Procedure(s) (LRB): IRRIGATION AND DEBRIDEMENT SHOULDER, deep cultures and polyethylene exchange (Left)  Patient location during evaluation: PACU Anesthesia Type: General Level of consciousness: sedated Pain management: pain level controlled Vital Signs Assessment: post-procedure vital signs reviewed and stable Respiratory status: spontaneous breathing and respiratory function stable Cardiovascular status: stable Anesthetic complications: no       Last Vitals:  Vitals:   03/31/16 2015 03/31/16 2045  BP: 96/70 (!) 87/59  Pulse: 98 87  Resp: 14 12  Temp:      Last Pain:  Vitals:   03/31/16 2000  TempSrc:   PainSc: Asleep                 Trevian Hayashida DANIEL

## 2016-03-31 NOTE — Brief Op Note (Signed)
03/31/2016  7:10 PM  PATIENT:  Frederick Guzman  66 y.o. male  PRE-OPERATIVE DIAGNOSIS:  Left shoulder infection status post total shoulder arthroplasty   POST-OPERATIVE DIAGNOSIS:  Left shoulder infection status post total shoulder arthroplasty   PROCEDURE:  Procedure(s) with comments: IRRIGATION AND DEBRIDEMENT SHOULDER, deep cultures and polyethylene exchange (Left) - requests 56mins  SURGEON:  Surgeon(s) and Role:    * Netta Cedars, MD - Primary  PHYSICIAN ASSISTANT:   ASSISTANTS: Ventura Bruns, PA-C   ANESTHESIA:   general  EBL:  No intake/output data recorded.  BLOOD ADMINISTERED:none  DRAINS: Hemovac deep to deltoid, sewn in   LOCAL MEDICATIONS USED:  MARCAINE     SPECIMEN:  Source of Specimen:  Fluid and Tissue, deep shoulder joint  DISPOSITION OF SPECIMEN:  micro  COUNTS:  YES  TOURNIQUET:  * No tourniquets in log *  DICTATION: .Other Dictation: Dictation Number [id number]  PLAN OF CARE: Admit for overnight observation  PATIENT DISPOSITION:  PACU - hemodynamically stable.   Delay start of Pharmacological VTE agent (>24hrs) due to surgical blood loss or risk of bleeding: not applicable

## 2016-03-31 NOTE — Anesthesia Procedure Notes (Signed)
Procedure Name: Intubation Date/Time: 03/31/2016 4:29 PM Performed by: Everlean Cherry A Pre-anesthesia Checklist: Patient identified, Emergency Drugs available, Suction available and Patient being monitored Patient Re-evaluated:Patient Re-evaluated prior to inductionOxygen Delivery Method: Circle system utilized Preoxygenation: Pre-oxygenation with 100% oxygen Intubation Type: IV induction Ventilation: Mask ventilation without difficulty and Oral airway inserted - appropriate to patient size Laryngoscope Size: Sabra Heck and 2 Grade View: Grade II Tube type: Oral Tube size: 8.0 mm Number of attempts: 2 Airway Equipment and Method: Stylet Placement Confirmation: ETT inserted through vocal cords under direct vision,  positive ETCO2 and breath sounds checked- equal and bilateral Secured at: 24 cm Tube secured with: Tape Dental Injury: Teeth and Oropharynx as per pre-operative assessment  Comments: DL x1 with Mil 2.  Grade 3 view.  DL x2 with Mil 2 by MDA.  Grade 2 view.  Successful placement.  EBBS and VSS.

## 2016-03-31 NOTE — Interval H&P Note (Signed)
History and Physical Interval Note:  03/31/2016 4:07 PM  Frederick Guzman  has presented today for surgery, with the diagnosis of Left shoulder infection status post total shoulder arthroplasty   The various methods of treatment have been discussed with the patient and family. After consideration of risks, benefits and other options for treatment, the patient has consented to  Procedure(s) with comments: IRRIGATION AND DEBRIDEMENT SHOULDER, deep cultures and polyethylene exchange (Left) - requests 58mins TOTAL SHOULDER REVISION (Left) as a surgical intervention .  The patient's history has been reviewed, patient examined, no change in status, stable for surgery.  I have reviewed the patient's chart and labs.  Questions were answered to the patient's satisfaction.     Belissa Kooy,STEVEN R

## 2016-03-31 NOTE — Discharge Instructions (Signed)
Ice to the shoulder as much as you can.  Rest the shoulder.  Use the sling.  Prop pillows or blankets under the arm to keep the arm across the waist.  Minimize the amount that you smoke to allow your body to heal the surgery and to fight infection.  Keep the incision clean and dry and covered until you see Dr Veverly Fells in the office in one week.  Call office for one week follow up appt  [phone]

## 2016-03-31 NOTE — Transfer of Care (Signed)
Immediate Anesthesia Transfer of Care Note  Patient: Frederick Guzman  Procedure(s) Performed: Procedure(s) with comments: IRRIGATION AND DEBRIDEMENT SHOULDER, deep cultures and polyethylene exchange (Left) - requests 59mins  Patient Location: PACU  Anesthesia Type:General  Level of Consciousness: awake, alert , oriented and patient cooperative  Airway & Oxygen Therapy: Patient Spontanous Breathing and Patient connected to face mask oxygen  Post-op Assessment: Report given to RN, Post -op Vital signs reviewed and stable and Patient moving all extremities X 4  Post vital signs: Reviewed and stable  Last Vitals:  Vitals:   03/31/16 1348  BP: 134/76  Pulse: 87  Resp: 20  Temp: 36.7 C    Last Pain:  Vitals:   03/31/16 1348  TempSrc: Oral  PainSc:       Patients Stated Pain Goal: 8 ([id number])  Complications: No apparent anesthesia complications

## 2016-04-01 DIAGNOSIS — I252 Old myocardial infarction: Secondary | ICD-10-CM | POA: Diagnosis not present

## 2016-04-01 DIAGNOSIS — I2510 Atherosclerotic heart disease of native coronary artery without angina pectoris: Secondary | ICD-10-CM | POA: Diagnosis not present

## 2016-04-01 DIAGNOSIS — Z7982 Long term (current) use of aspirin: Secondary | ICD-10-CM | POA: Diagnosis not present

## 2016-04-01 DIAGNOSIS — Z79899 Other long term (current) drug therapy: Secondary | ICD-10-CM | POA: Diagnosis not present

## 2016-04-01 DIAGNOSIS — I10 Essential (primary) hypertension: Secondary | ICD-10-CM | POA: Diagnosis not present

## 2016-04-01 DIAGNOSIS — T8459XA Infection and inflammatory reaction due to other internal joint prosthesis, initial encounter: Secondary | ICD-10-CM | POA: Diagnosis not present

## 2016-04-01 LAB — HEMOGLOBIN AND HEMATOCRIT, BLOOD
HEMATOCRIT: 35.1 % — AB (ref 39.0–52.0)
HEMOGLOBIN: 11.6 g/dL — AB (ref 13.0–17.0)

## 2016-04-01 LAB — BASIC METABOLIC PANEL
Anion gap: 5 (ref 5–15)
BUN: 6 mg/dL (ref 6–20)
CALCIUM: 8 mg/dL — AB (ref 8.9–10.3)
CO2: 29 mmol/L (ref 22–32)
CREATININE: 0.81 mg/dL (ref 0.61–1.24)
Chloride: 100 mmol/L — ABNORMAL LOW (ref 101–111)
Glucose, Bld: 135 mg/dL — ABNORMAL HIGH (ref 65–99)
Potassium: 3.5 mmol/L (ref 3.5–5.1)
SODIUM: 134 mmol/L — AB (ref 135–145)

## 2016-04-01 NOTE — Discharge Summary (Signed)
Physician Discharge Summary   Patient ID: Frederick Guzman MRN: [id number] DOB/AGE: 24-Aug-1950 66 y.o.  Admit date: 03/31/2016 Discharge date: 04/01/2016  Admission Diagnoses:  Active Problems:   Infection and inflammatory reaction due to internal orthopedic device, implant, and graft [facility])   Discharge Diagnoses:  Same   Surgeries: Procedure(s): IRRIGATION AND DEBRIDEMENT SHOULDER, deep cultures and polyethylene exchange on 03/31/2016   Consultants: none  Discharged Condition: Stable  Hospital Course: Frederick Guzman is an 66 y.o. male who was admitted 03/31/2016 with a chief complaint of left shoulder pain, and found to have a diagnosis of left shoulder prosthetic infection.  They were brought to the operating room on 03/31/2016 and underwent the above named procedures.    The patient had an uncomplicated hospital course and was stable for discharge.  Recent vital signs:  Vitals:   04/01/16 0447 04/01/16 1448  BP: 122/70 98/78  Pulse: 75 68  Resp: 17   Temp: 97.1 F (36.2 C) 97.1 F (36.2 C)    Recent laboratory studies:  Results for orders placed or performed during the hospital encounter of 03/31/16  Aerobic/Anaerobic Culture (surgical/deep wound)  Result Value Ref Range   Specimen Description FLUID LEFT SHOULDER    Special Requests HOLD PLATES 14 DAYS    Gram Stain      ABUNDANT WBC PRESENT,BOTH PMN AND MONONUCLEAR NO ORGANISMS SEEN    Culture NO GROWTH < 24 HOURS    Report Status PENDING   Aerobic/Anaerobic Culture (surgical/deep wound)  Result Value Ref Range   Specimen Description TISSUE LEFT SHOULDER    Special Requests PSEUDOCAPSULE HOLD PLATES 14 DAYS    Gram Stain      ABUNDANT WBC PRESENT,BOTH PMN AND MONONUCLEAR RARE GRAM POSITIVE COCCI IN PAIRS    Culture NO GROWTH < 24 HOURS    Report Status PENDING   Basic metabolic panel  Result Value Ref Range   Sodium 139 135 - 145 mmol/L   Potassium 4.2 3.5 - 5.1 mmol/L   Chloride 106 101 - 111  mmol/L   CO2 25 22 - 32 mmol/L   Glucose, Bld 96 65 - 99 mg/dL   BUN 7 6 - 20 mg/dL   Creatinine, Ser 0.79 0.61 - 1.24 mg/dL   Calcium 9.1 8.9 - 10.3 mg/dL   GFR calc non Af Amer >60 >60 mL/min   GFR calc Af Amer >60 >60 mL/min   Anion gap 8 5 - 15  CBC  Result Value Ref Range   WBC 8.8 4.0 - 10.5 K/uL   RBC 5.11 4.22 - 5.81 MIL/uL   Hemoglobin 15.7 13.0 - 17.0 g/dL   HCT 45.9 39.0 - 52.0 %   MCV 89.8 78.0 - 100.0 fL   MCH 30.7 26.0 - 34.0 pg   MCHC 34.2 30.0 - 36.0 g/dL   RDW 14.2 11.5 - 15.5 %   Platelets 257 150 - 400 K/uL  Hemoglobin and hematocrit, blood  Result Value Ref Range   Hemoglobin 11.6 (L) 13.0 - 17.0 g/dL   HCT 35.1 (L) 39.0 - [id number] %  Basic metabolic panel  Result Value Ref Range   Sodium 134 (L) 135 - 145 mmol/L   Potassium 3.5 3.5 - 5.1 mmol/L   Chloride 100 (L) 101 - 111 mmol/L   CO2 29 22 - 32 mmol/L   Glucose, Bld 135 (H) 65 - 99 mg/dL   BUN 6 6 - 20 mg/dL   Creatinine, Ser 0.81 0.61 - 1.24 mg/dL   Calcium  8.0 (L) 8.9 - 10.3 mg/dL   GFR calc non Af Amer >60 >60 mL/min   GFR calc Af Amer >60 >60 mL/min   Anion gap 5 5 - 15    Discharge Medications:   Allergies as of 04/01/2016      Reactions   Methotrexate Derivatives Nausea And Vomiting, Other (See Comments)   Mood change   Ultram [tramadol Hcl] Nausea And Vomiting      Medication List    TAKE these medications   amoxicillin 500 MG capsule Commonly known as:  AMOXIL Take 1 capsule (500 mg total) by mouth 3 (three) times daily.   aspirin EC 81 MG tablet Take 81 mg by mouth at bedtime.   atorvastatin 20 MG tablet Commonly known as:  LIPITOR Take 20 mg by mouth at bedtime.   AXIRON 30 MG/ACT Soln Generic drug:  Testosterone Place 90 mg onto the skin daily. 2 pumps under one arm & 1 pump under other arm=3 pumps daily   B-complex with vitamin C tablet Take 1 tablet by mouth daily.   budesonide-formoterol 160-4.5 MCG/ACT inhaler Commonly known as:  SYMBICORT Inhale 2 puffs into the  lungs 2 (two) times daily.   DULoxetine 30 MG capsule Commonly known as:  CYMBALTA Take 30 mg by mouth daily. Take with a 60 mg capsule for a 90 mg dose   DULoxetine 60 MG capsule Commonly known as:  CYMBALTA Take 60 mg by mouth daily. Take with a 30 mg capsule for a 90 mg dose   esomeprazole 20 MG capsule Commonly known as:  NEXIUM Take 20 mg by mouth daily at 12 noon.   Fish Oil 1000 MG Caps Take 1,000 mg by mouth daily.   fluticasone 50 MCG/ACT nasal spray Commonly known as:  FLONASE Place 1 spray into both nostrils daily as needed for allergies.   ipratropium-albuterol 0.5-2.5 (3) MG/3ML Soln Commonly known as:  DUONEB Take 3 mLs by nebulization every 6 (six) hours as needed. What changed:  reasons to take this   lisinopril 5 MG tablet Commonly known as:  PRINIVIL,ZESTRIL Take 5 mg by mouth at bedtime.   methocarbamol 500 MG tablet Commonly known as:  ROBAXIN Take 1 tablet (500 mg total) by mouth 3 (three) times daily as needed.   multivitamin with minerals Tabs tablet Take 1 tablet by mouth at bedtime.   oxyCODONE-acetaminophen 7.5-325 MG tablet Commonly known as:  PERCOCET Take 1-2 tablets by mouth every 4 (four) hours as needed for severe pain. What changed:  Another medication with the same name was added. Make sure you understand how and when to take each.   oxyCODONE-acetaminophen 7.5-325 MG tablet Commonly known as:  PERCOCET Take 1 tablet by mouth every 4 (four) hours as needed for severe pain. What changed:  You were already taking a medication with the same name, and this prescription was added. Make sure you understand how and when to take each.   PROAIR HFA 108 (90 Base) MCG/ACT inhaler Generic drug:  albuterol Inhale 2 puffs into the lungs every 6 (six) hours as needed for wheezing or shortness of breath.   rifampin 300 MG capsule Commonly known as:  RIFADIN Take 1 capsule (300 mg total) by mouth 2 (two) times daily.   tamsulosin 0.4 MG Caps  capsule Commonly known as:  FLOMAX Take 0.4 mg by mouth 2 (two) times daily.   vitamin C 500 MG tablet Commonly known as:  ASCORBIC ACID Take 500 mg by mouth daily.  Diagnostic Studies: Dg Fluoro Guided Needle Plc Aspiration/injection Loc  Result Date: 03/23/2016 CLINICAL DATA:  Shoulder arthroplasty 6 weeks ago. Pain, redness and swelling has developed. FLUOROSCOPY TIME:  0 minutes 23 seconds. 37.16 micro gray meter squared PROCEDURE: Left shoulder aspiration UNDER FLUOROSCOPY An appropriate skin entrance site was determined. The site was marked, prepped with Betadine, draped in the usual sterile fashion, and infiltrated locally with 1% lidocaine. Under fluoroscopic guidance, a 20 gauge needle was directed into the prosthetic glenohumeral articulation. Approximately 10 cc of cloudy bloody fluid were easily aspirated. Sample was submitted for requested studies. The procedure was well-tolerated. IMPRESSION: Technically successful left shoulder joint aspiration. Electronically Signed   By: Nelson Chimes M.D.   On: 03/23/2016 09:52    Disposition: 01-Home or Self Care  Discharge Instructions    Call MD / Call 911    Complete by:  As directed    If you experience chest pain or shortness of breath, CALL 911 and be transported to the hospital emergency room.  If you develope a fever above 101 F, pus (white drainage) or increased drainage or redness at the wound, or calf pain, call your surgeon's office.   Constipation Prevention    Complete by:  As directed    Drink plenty of fluids.  Prune juice may be helpful.  You may use a stool softener, such as Colace (over the counter) 100 mg twice a day.  Use MiraLax (over the counter) for constipation as needed.   Diet - low sodium heart healthy    Complete by:  As directed    Driving restrictions    Complete by:  As directed    No driving for 2 weeks   Increase activity slowly as tolerated    Complete by:  As directed       Follow-up  Information    Trey Gulbranson,STEVEN R, MD. Call in 1 week(s).   Specialty:  Orthopedic Surgery Why:  [id number] Contact information: [address] [place] [address] [phone]            Signed: Augustin Schooling 04/01/2016, 6:09 PM

## 2016-04-01 NOTE — Progress Notes (Signed)
Patient refused his CPAP for the night and he did not want the continuous pulse ox on.  Patient was educated on the importance of him using his cpap and being monitored by the continuous pulse ox especially while taking narcotics.  Checked on patient more frequently to make sure he is okay.  He did agree to where oxygen 2 L.

## 2016-04-01 NOTE — Progress Notes (Signed)
Discharge instructions, RX's and follow up appts explained and provided to the patient verbalized understanding. Patient left floor via wheelchair accompanied by staff no c/o pain at d/c.  Pancho Rushing, Tivis Ringer, RN

## 2016-04-01 NOTE — Op Note (Signed)
NAMEEVERETTE, Frederick Guzman NO.:  [id number]  MEDICAL RECORD NO.:  [id number]  LOCATION:                                FACILITY:  [facility]  PHYSICIAN:  Doran Heater. Veverly Fells, M.D. DATE OF BIRTH:  11-23-1950  DATE OF PROCEDURE:  03/31/2016 DATE OF DISCHARGE:                              OPERATIVE REPORT   PREOPERATIVE DIAGNOSIS:  Left shoulder infection status post total shoulder arthroplasty.  POSTOPERATIVE DIAGNOSIS:  Left shoulder infection status post total shoulder arthroplasty.  PROCEDURE PERFORMED:  Irrigation and debridement of left total shoulder with deep cultures and polyethylene exchange.  SURGEON:  Doran Heater. Veverly Fells, MD  ASSISTANT:  Abbott Pao. Dixon, PA-C, who has scrubbed the entire procedure and necessary for satisfactory completion of surgery.  ANESTHESIA:  General anesthesia was used plus local infiltration with Marcaine.  ESTIMATED BLOOD LOSS:  Less than 100 mL.  FLUID REPLACED:  1500 mL crystalloid.  INSTRUMENT COUNTS:  Correct.  COMPLICATIONS:  No complications.  Intraoperative cultures were obtained x2, one was fluid and the other tissue, deep in the shoulder joint and antibiotics were held until after obtaining cultures and then 2 g of Ancef were given.  INDICATIONS:  Patient is a 66 year old male, who had a shoulder replacement performed on January 30, 2016, for end-staged rotator cuff tear arthropathy.  The patient had progressive pain prior to the surgery, underwent successful arthroplasty and did well for 5 weeks postoperatively, began having symptoms including pain and limited range of motion.  Initial evaluation by Dr. Susa Day, was concerned for possible infection.  Laboratories were ordered as an outpatient including sed rate, CRP which were elevated and the patient referred for aspiration, which was done about 2 weeks ago and the aspiration was elevated white count, but no growth.  The patient presented to me yesterday in the  clinic for followup and did appear to be acutely inflamed, possibly infected due to high suspicion for potential infection.  We did go and counsel patient regarding recommendation for surgical I and D, obtaining deep cultures and polyethylene exchange. Risks and benefits of the procedure were discussed in detail with the patient.  Informed consent obtained.  DESCRIPTION OF PROCEDURE:  After adequate level of anesthesia was achieved, the patient was positioned in the modified beach-chair position.  Left shoulder correctly identified, sterilely prepped and draped in usual manner.  Time-out called.  We went through the patient's prior deltopectoral incision with a fresh #10 blade scalpel.  Dissection down through subcutaneous tissues, identified the deltopectoral interval with some difficulty, but we were able to find that.  The cephalic vein was identified and ligated as it was injured in developing that interval.  Bleeding was controlled.  We then went ahead and was able to advance my finger through the pseudocapsule and into the joint.  Cloudy dark fluid was expressed.  We went ahead and cultured that.  Then, when I opened up the interval using a Cobb elevator in the deltoid muscle and freeing up the soft tissue planes to allow for dislocation of the reverse prosthesis which was located and seemed to be stable.  We then went ahead and obtained some deep tissue cultures and sent that, really took  the pseudocapsule and sent that to microbiology for Gram stain, aerobic and anaerobic cultures.  Instructions were given to the microbiologist for holding cultures for 14 days over possible P. acne infection.  We then did a systematic removal of all pseudocapsule and nonviable appearing tissue.  There was quite a bit of inflammatory tissue in the shoulder, which we removed.  The implants themselves seemed stable.  We did dislocate the shoulder, removed the polyethylene insert.  We did a total of  12 L of normal saline irrigation as well as 500 mL bug juice in between.  We then trialed with a 42 +6 trial, felt like we could get the +9 and selected real 42+ 9 poly insert and impacted onto the humerus.  We had placed the humeral stem insertion handle and checked rotational stability.  The implant was stable and also checked the stability of the glenosphere and metaglene, everything was very stable.  No signs of any loosening.  No signs of soft bone around the humeral implant and once we were sure, we had a sufficient soft tissue debridement and irrigation, we placed a drain deep to the muscle and then closed the deltopectoral interval with interrupted PDS suture that has antibiotic impregnation with it and we did a full- thickness nylon closure, sterile compressive bandage and shoulder sling. Patient was taken to recovery room in stable condition.     Doran Heater. Veverly Fells, M.D.     SRN/MEDQ  D:  03/31/2016  T:  04/01/2016  Job:  [id number]

## 2016-04-01 NOTE — Progress Notes (Signed)
Orthopedics Progress Note  Subjective: Patient complaining of burning left shoulder pain  Objective:  Vitals:   03/31/16 2235 04/01/16 0447  BP: 117/64 122/70  Pulse: 84 75  Resp: 17 17  Temp: 97.5 F (36.4 C) 97.1 F (36.2 C)    General: Awake and alert  Musculoskeletal: left shoulder dressing intact, minimal swelling in the arm, NVI distally, drain still putting out greater than 100 cc per shift Neurovascularly intact  Lab Results  Component Value Date   WBC 8.8 03/31/2016   HGB 11.6 (L) 04/01/2016   HCT 35.1 (L) 04/01/2016   MCV 89.8 03/31/2016   PLT 257 03/31/2016       Component Value Date/Time   NA 134 (L) 04/01/2016 0545   K 3.5 04/01/2016 0545   CL 100 (L) 04/01/2016 0545   CO2 29 04/01/2016 0545   GLUCOSE 135 (H) 04/01/2016 0545   BUN 6 04/01/2016 0545   CREATININE 0.81 04/01/2016 0545   CALCIUM 8.0 (L) 04/01/2016 0545   GFRNONAA >60 04/01/2016 0545   GFRAA >60 04/01/2016 0545    Lab Results  Component Value Date   INR 1.19 07/13/2015    Assessment/Plan: POD #1 s/p Procedure(s): IRRIGATION AND DEBRIDEMENT SHOULDER, deep cultures and polyethylene exchange Gram positive cocci in pairs (? Strep) on the Gram stain yesterday Currently on po Amoxacillin and Rifampin for P Acnes coverage - await ID and sensitivity Still working on pain control today and need to get drain output down before discharge  [employer]. Veverly Fells, MD 04/01/2016 7:32 AM

## 2016-04-01 NOTE — Progress Notes (Signed)
Orthopedics Progress Note  Subjective: Pain under better control.  Patient wanting to go home  Objective:  Vitals:   04/01/16 0447 04/01/16 1448  BP: 122/70 98/78  Pulse: 75 68  Resp: 17   Temp: 97.1 F (36.2 C) 97.1 F (36.2 C)    General: Awake and alert  Musculoskeletal: Dressing changed and drain removed. Incision looks good Neurovascularly intact  Lab Results  Component Value Date   WBC 8.8 03/31/2016   HGB 11.6 (L) 04/01/2016   HCT 35.1 (L) 04/01/2016   MCV 89.8 03/31/2016   PLT 257 03/31/2016       Component Value Date/Time   NA 134 (L) 04/01/2016 0545   K 3.5 04/01/2016 0545   CL 100 (L) 04/01/2016 0545   CO2 29 04/01/2016 0545   GLUCOSE 135 (H) 04/01/2016 0545   BUN 6 04/01/2016 0545   CREATININE 0.81 04/01/2016 0545   CALCIUM 8.0 (L) 04/01/2016 0545   GFRNONAA >60 04/01/2016 0545   GFRAA >60 04/01/2016 0545    Lab Results  Component Value Date   INR 1.19 07/13/2015    Assessment/Plan: POD #1 s/p Procedure(s): IRRIGATION AND DEBRIDEMENT SHOULDER, deep cultures and polyethylene exchange Drain output decreased over the day.  D/C to home on po abx based upon gram stain results Will follow cultures closely.  Follow up in one week in the office [place]. Veverly Fells, MD 04/01/2016 6:06 PM

## 2016-04-02 ENCOUNTER — Encounter (HOSPITAL_COMMUNITY): Payer: Self-pay | Admitting: Orthopedic Surgery

## 2016-04-06 DIAGNOSIS — E782 Mixed hyperlipidemia: Secondary | ICD-10-CM | POA: Diagnosis not present

## 2016-04-06 DIAGNOSIS — R7301 Impaired fasting glucose: Secondary | ICD-10-CM | POA: Diagnosis not present

## 2016-04-06 DIAGNOSIS — I10 Essential (primary) hypertension: Secondary | ICD-10-CM | POA: Diagnosis not present

## 2016-04-06 DIAGNOSIS — R7982 Elevated C-reactive protein (CRP): Secondary | ICD-10-CM | POA: Diagnosis not present

## 2016-04-06 DIAGNOSIS — E349 Endocrine disorder, unspecified: Secondary | ICD-10-CM | POA: Diagnosis not present

## 2016-04-06 LAB — BODY FLUID CULTURE
GRAM STAIN: NONE SEEN
Organism ID, Bacteria: NO GROWTH

## 2016-04-07 LAB — AEROBIC/ANAEROBIC CULTURE W GRAM STAIN (SURGICAL/DEEP WOUND)

## 2016-04-07 LAB — AEROBIC/ANAEROBIC CULTURE (SURGICAL/DEEP WOUND)

## 2016-04-09 DIAGNOSIS — J449 Chronic obstructive pulmonary disease, unspecified: Secondary | ICD-10-CM | POA: Diagnosis not present

## 2016-04-09 DIAGNOSIS — D72829 Elevated white blood cell count, unspecified: Secondary | ICD-10-CM | POA: Diagnosis not present

## 2016-04-09 DIAGNOSIS — Z471 Aftercare following joint replacement surgery: Secondary | ICD-10-CM | POA: Diagnosis not present

## 2016-04-09 DIAGNOSIS — I10 Essential (primary) hypertension: Secondary | ICD-10-CM | POA: Diagnosis not present

## 2016-04-09 DIAGNOSIS — R339 Retention of urine, unspecified: Secondary | ICD-10-CM | POA: Diagnosis not present

## 2016-04-09 DIAGNOSIS — Z96612 Presence of left artificial shoulder joint: Secondary | ICD-10-CM | POA: Diagnosis not present

## 2016-04-09 DIAGNOSIS — Z09 Encounter for follow-up examination after completed treatment for conditions other than malignant neoplasm: Secondary | ICD-10-CM | POA: Diagnosis not present

## 2016-04-09 DIAGNOSIS — E782 Mixed hyperlipidemia: Secondary | ICD-10-CM | POA: Diagnosis not present

## 2016-04-09 DIAGNOSIS — N401 Enlarged prostate with lower urinary tract symptoms: Secondary | ICD-10-CM | POA: Diagnosis not present

## 2016-04-09 DIAGNOSIS — R7301 Impaired fasting glucose: Secondary | ICD-10-CM | POA: Diagnosis not present

## 2016-04-12 DIAGNOSIS — N3289 Other specified disorders of bladder: Secondary | ICD-10-CM | POA: Diagnosis not present

## 2016-04-12 DIAGNOSIS — N401 Enlarged prostate with lower urinary tract symptoms: Secondary | ICD-10-CM | POA: Diagnosis not present

## 2016-05-11 DIAGNOSIS — Z471 Aftercare following joint replacement surgery: Secondary | ICD-10-CM | POA: Diagnosis not present

## 2016-05-11 DIAGNOSIS — Z96612 Presence of left artificial shoulder joint: Secondary | ICD-10-CM | POA: Diagnosis not present

## 2016-05-11 DIAGNOSIS — N401 Enlarged prostate with lower urinary tract symptoms: Secondary | ICD-10-CM | POA: Diagnosis not present

## 2016-05-11 DIAGNOSIS — G4733 Obstructive sleep apnea (adult) (pediatric): Secondary | ICD-10-CM | POA: Diagnosis not present

## 2016-05-11 DIAGNOSIS — Z8042 Family history of malignant neoplasm of prostate: Secondary | ICD-10-CM | POA: Diagnosis not present

## 2016-05-11 DIAGNOSIS — I10 Essential (primary) hypertension: Secondary | ICD-10-CM | POA: Diagnosis not present

## 2016-05-11 DIAGNOSIS — E291 Testicular hypofunction: Secondary | ICD-10-CM | POA: Diagnosis not present

## 2016-05-11 DIAGNOSIS — Z720 Tobacco use: Secondary | ICD-10-CM | POA: Diagnosis not present

## 2016-05-12 DIAGNOSIS — F17210 Nicotine dependence, cigarettes, uncomplicated: Secondary | ICD-10-CM | POA: Diagnosis not present

## 2016-05-12 DIAGNOSIS — I10 Essential (primary) hypertension: Secondary | ICD-10-CM | POA: Diagnosis not present

## 2016-05-12 DIAGNOSIS — J449 Chronic obstructive pulmonary disease, unspecified: Secondary | ICD-10-CM | POA: Diagnosis not present

## 2016-05-12 DIAGNOSIS — Z9989 Dependence on other enabling machines and devices: Secondary | ICD-10-CM | POA: Diagnosis not present

## 2016-05-12 DIAGNOSIS — G4733 Obstructive sleep apnea (adult) (pediatric): Secondary | ICD-10-CM | POA: Diagnosis not present

## 2016-05-21 DIAGNOSIS — R7301 Impaired fasting glucose: Secondary | ICD-10-CM | POA: Diagnosis not present

## 2016-05-21 DIAGNOSIS — N401 Enlarged prostate with lower urinary tract symptoms: Secondary | ICD-10-CM | POA: Diagnosis not present

## 2016-05-21 DIAGNOSIS — I10 Essential (primary) hypertension: Secondary | ICD-10-CM | POA: Diagnosis not present

## 2016-05-21 DIAGNOSIS — E782 Mixed hyperlipidemia: Secondary | ICD-10-CM | POA: Diagnosis not present

## 2016-05-21 DIAGNOSIS — J441 Chronic obstructive pulmonary disease with (acute) exacerbation: Secondary | ICD-10-CM | POA: Diagnosis not present

## 2016-05-21 DIAGNOSIS — M797 Fibromyalgia: Secondary | ICD-10-CM | POA: Insufficient documentation

## 2016-05-31 DIAGNOSIS — R2231 Localized swelling, mass and lump, right upper limb: Secondary | ICD-10-CM | POA: Diagnosis not present

## 2016-05-31 DIAGNOSIS — L720 Epidermal cyst: Secondary | ICD-10-CM | POA: Diagnosis not present

## 2016-06-08 DIAGNOSIS — Z471 Aftercare following joint replacement surgery: Secondary | ICD-10-CM | POA: Diagnosis not present

## 2016-06-08 DIAGNOSIS — Z96612 Presence of left artificial shoulder joint: Secondary | ICD-10-CM | POA: Diagnosis not present

## 2016-06-09 DIAGNOSIS — Z4789 Encounter for other orthopedic aftercare: Secondary | ICD-10-CM | POA: Diagnosis not present

## 2016-06-27 ENCOUNTER — Encounter: Payer: Self-pay | Admitting: Emergency Medicine

## 2016-06-27 ENCOUNTER — Emergency Department (INDEPENDENT_AMBULATORY_CARE_PROVIDER_SITE_OTHER)
Admission: EM | Admit: 2016-06-27 | Discharge: 2016-06-27 | Disposition: A | Discharge status: Home / Self Care | Payer: Medicare Other | Source: Home / Self Care | Attending: Family Medicine | Admitting: Family Medicine

## 2016-06-27 DIAGNOSIS — L089 Local infection of the skin and subcutaneous tissue, unspecified: Secondary | ICD-10-CM | POA: Diagnosis not present

## 2016-06-27 DIAGNOSIS — S50852A Superficial foreign body of left forearm, initial encounter: Secondary | ICD-10-CM | POA: Diagnosis not present

## 2016-06-27 MED ORDER — DOXYCYCLINE HYCLATE 100 MG PO CAPS: 100.0000 mg | ORAL_CAPSULE | Freq: Two times a day (BID) | ORAL | 0 refills | Status: DC

## 2016-06-27 NOTE — ED Provider Notes (Signed)
CSN: [id number]     Arrival date & time 06/27/16  1236 History   First MD Initiated Contact with Patient 06/27/16 1257     Chief Complaint  Patient presents with  . Foreign Body in Skin   (Consider location/radiation/quality/duration/timing/severity/associated sxs/prior Treatment) HPI Frederick Guzman is a 66 y.o. male presenting to UC with wife with c/o wood splinter in Left forearm that came from a fence 4 days ago. He has tried to get it out but cannot get a good grip as it is in an awkward position and seems to be getting deeper into his skin.  He is concerned it has become infected as it is red, swollen, painful and draining some pus at times. Denies fever or chills.  Pt notes he plans to go to the beach next weekend for a fishing trip and hopes to be better by then.    Past Medical History:  Diagnosis Date  . Arthritis    OA  . Asthma   . Cancer ([place]) 08/2014   melanoma on R shoulder & back - [facility]   . COPD (chronic obstructive pulmonary disease) ([place])   . Depression    pt. denies- 01/2016  . Dyspnea    with exertion  . Fibromyalgia   . GERD (gastroesophageal reflux disease)   . HTN (hypertension)   . Hyperlipemia   . Pneumonia 06/2015  . Sleep apnea    last study - Nov. 2017, waiting for a new machine, using the old CPAP q night after cleaning the device after he was documented to have pneumonia from his CPAP machine.    Past Surgical History:  Procedure Laterality Date  . IRRIGATION AND DEBRIDEMENT SHOULDER Left 03/31/2016   Procedure: IRRIGATION AND DEBRIDEMENT SHOULDER, deep cultures and polyethylene exchange;  Surgeon: Netta Cedars, MD;  Location: [place];  Service: Orthopedics;  Laterality: Left;  requests 82mins  . REVERSE SHOULDER ARTHROPLASTY Left 01/30/2016   Procedure: LEFT REVERSE SHOULDER ARTHROPLASTY;  Surgeon: Netta Cedars, MD;  Location: [place];  Service: Orthopedics;  Laterality: Left;  . SHOULDER SURGERY Left 2017  . WRIST SURGERY Right 11/2014   Dr.  Amedeo Plenty   History reviewed. No pertinent family history. Social History  Substance Use Topics  . Smoking status: Current Every Day Smoker    Packs/day: 1.00    Years: 50.00  . Smokeless tobacco: Never Used  . Alcohol use 5.4 oz/week    6 Cans of beer, 3 Shots of liquor per week     Comment: "it depends on my mood "- 03/30/16 - "I havent had  a drink in a  long time"    Review of Systems  Constitutional: Negative for chills and fever.  Gastrointestinal: Negative for diarrhea, nausea and vomiting.  Musculoskeletal: Positive for myalgias. Negative for arthralgias and joint swelling.  Skin: Positive for color change and wound.    Allergies  Methotrexate derivatives and Ultram [tramadol hcl]  Home Medications   Prior to Admission medications   Medication Sig Start Date End Date Taking? Authorizing Provider  albuterol (PROAIR HFA) 108 (90 Base) MCG/ACT inhaler Inhale 2 puffs into the lungs every 6 (six) hours as needed for wheezing or shortness of breath.    Historical Provider, MD  amoxicillin (AMOXIL) 500 MG capsule Take 1 capsule (500 mg total) by mouth 3 (three) times daily. 03/31/16   Netta Cedars, MD  aspirin EC 81 MG tablet Take 81 mg by mouth at bedtime.    Historical Provider, MD  atorvastatin (  LIPITOR) 20 MG tablet Take 20 mg by mouth at bedtime. 05/30/15   Historical Provider, MD  B Complex-C (B-COMPLEX WITH VITAMIN C) tablet Take 1 tablet by mouth daily.    Historical Provider, MD  budesonide-formoterol (SYMBICORT) 160-4.5 MCG/ACT inhaler Inhale 2 puffs into the lungs 2 (two) times daily.    Historical Provider, MD  doxycycline (VIBRAMYCIN) 100 MG capsule Take 1 capsule (100 mg total) by mouth 2 (two) times daily. One po bid x 7 days 06/27/16   Noland Fordyce, PA-C  DULoxetine (CYMBALTA) 30 MG capsule Take 30 mg by mouth daily. Take with a 60 mg capsule for a 90 mg dose 12/30/15   Historical Provider, MD  DULoxetine (CYMBALTA) 60 MG capsule Take 60 mg by mouth daily. Take with a 30  mg capsule for a 90 mg dose    Historical Provider, MD  esomeprazole (NEXIUM) 20 MG capsule Take 20 mg by mouth daily at 12 noon.    Historical Provider, MD  fluticasone (FLONASE) 50 MCG/ACT nasal spray Place 1 spray into both nostrils daily as needed for allergies. 12/30/15   Historical Provider, MD  ipratropium-albuterol (DUONEB) 0.5-2.5 (3) MG/3ML SOLN Take 3 mLs by nebulization every 6 (six) hours as needed. Patient taking differently: Take 3 mLs by nebulization every 6 (six) hours as needed (for wheezing/shortness of breath).  07/13/15   Costin Karlyne Greenspan, MD  lisinopril (PRINIVIL,ZESTRIL) 5 MG tablet Take 5 mg by mouth at bedtime.     Historical Provider, MD  methocarbamol (ROBAXIN) 500 MG tablet Take 1 tablet (500 mg total) by mouth 3 (three) times daily as needed. 03/31/16   Netta Cedars, MD  Multiple Vitamin (MULTIVITAMIN WITH MINERALS) TABS tablet Take 1 tablet by mouth at bedtime.    Historical Provider, MD  Omega-3 Fatty Acids (FISH OIL) 1000 MG CAPS Take 1,000 mg by mouth daily.    Historical Provider, MD  oxyCODONE-acetaminophen (PERCOCET) 7.5-325 MG tablet Take 1-2 tablets by mouth every 4 (four) hours as needed for severe pain. 01/30/16   Netta Cedars, MD  oxyCODONE-acetaminophen (PERCOCET) 7.5-325 MG tablet Take 1 tablet by mouth every 4 (four) hours as needed for severe pain. 03/31/16   Netta Cedars, MD  rifampin (RIFADIN) 300 MG capsule Take 1 capsule (300 mg total) by mouth 2 (two) times daily. 03/31/16   Netta Cedars, MD  Tamsulosin HCl (FLOMAX) 0.4 MG CAPS Take 0.4 mg by mouth 2 (two) times daily.  12/30/10   Historical Provider, MD  Testosterone (AXIRON) 30 MG/ACT SOLN Place 90 mg onto the skin daily. 2 pumps under one arm & 1 pump under other arm=3 pumps daily    Historical Provider, MD  vitamin C (ASCORBIC ACID) 500 MG tablet Take 500 mg by mouth daily.    Historical Provider, MD   Meds Ordered and Administered this Visit  Medications - No data to display  BP (!) 144/95 (BP  Location: Left Arm)   Pulse 84   Temp 97.6 F (36.4 C) (Oral)   Resp 16   Ht 6\' 1"  (1.854 m)   Wt 200 lb 8 oz (90.9 kg)   SpO2 98%   BMI 26.45 kg/m  No data found.   Physical Exam  Constitutional: He is oriented to person, place, and time. He appears well-developed and well-nourished. No distress.  HENT:  Head: Normocephalic and atraumatic.  Eyes: EOM are normal.  Neck: Normal range of motion.  Cardiovascular: Normal rate.   Pulmonary/Chest: Effort normal.  Musculoskeletal: Normal range of motion. He  exhibits edema and tenderness.       Left wrist: He exhibits tenderness.       Arms: Left distal forearm, ulnar aspect: mild edema with erythema, tenderness (see skin exam). Full ROM elbow and wrist.   Neurological: He is alert and oriented to person, place, and time.  Skin: Skin is warm and dry. He is not diaphoretic. There is erythema.  Left distal forearm, ulnar aspect: mild edema, erythema, tenderness with centralized darkened area c/w enterance of wood splinter per pt.  Scant purulent discharge present. Mild induration.    Psychiatric: He has a normal mood and affect. His behavior is normal.  Nursing note and vitals reviewed.   Urgent Care Course     .Foreign Body Removal Date/Time: 06/27/2016 1:20 PM Performed by: Noland Fordyce Authorized by: Theone Murdoch A  Consent: Verbal consent obtained. Risks and benefits: risks, benefits and alternatives were discussed Consent given by: patient Patient understanding: patient states understanding of the procedure being performed Site marked: the operative site was marked Required items: required blood products, implants, devices, and special equipment available Patient identity confirmed: verbally with patient Time out: Immediately prior to procedure a "time out" was called to verify the correct patient, procedure, equipment, support staff and site/side marked as required. Body area: skin General location: upper  extremity Location details: left forearm Anesthesia: local infiltration  Anesthesia: Local Anesthetic: lidocaine 2% with epinephrine Anesthetic total: 2 mL  Sedation: Patient sedated: no Patient restrained: no Patient cooperative: yes Localization method: visualized Removal mechanism: forceps Dressing: antibiotic ointment and dressing applied Tendon involvement: none Depth: deep Complexity: simple 1 objects recovered. Objects recovered: 1.5in wood sliver Post-procedure assessment: foreign body removed Patient tolerance: Patient tolerated the procedure well with no immediate complications   (including critical care time)  Labs Review Labs Reviewed - No data to display  Imaging Review No results found.   MDM   1. Foreign body of left forearm with infection, initial encounter    Foreign body removed w/o immediate complication. Small amount of blood and purulent drainage expressed after sliver removed.  Rx: Doxycycline  f/u with PCP in 5-7 days if not improving, sooner if worsening. May return to UC if needed.    Noland Fordyce, PA-C 06/27/16 1322

## 2016-06-27 NOTE — ED Triage Notes (Signed)
Patient presents to [facility] with C/O a splinter in the left wrist area. Obtained splinter from a fence on Thursday.

## 2016-07-14 DIAGNOSIS — Z0000 Encounter for general adult medical examination without abnormal findings: Secondary | ICD-10-CM | POA: Diagnosis not present

## 2016-07-14 DIAGNOSIS — T8450XD Infection and inflammatory reaction due to unspecified internal joint prosthesis, subsequent encounter: Secondary | ICD-10-CM | POA: Diagnosis not present

## 2016-07-21 DIAGNOSIS — L570 Actinic keratosis: Secondary | ICD-10-CM | POA: Diagnosis not present

## 2016-07-21 DIAGNOSIS — D2261 Melanocytic nevi of right upper limb, including shoulder: Secondary | ICD-10-CM | POA: Diagnosis not present

## 2016-07-21 DIAGNOSIS — D225 Melanocytic nevi of trunk: Secondary | ICD-10-CM | POA: Diagnosis not present

## 2016-07-21 DIAGNOSIS — Z85820 Personal history of malignant melanoma of skin: Secondary | ICD-10-CM | POA: Diagnosis not present

## 2016-07-21 DIAGNOSIS — L814 Other melanin hyperpigmentation: Secondary | ICD-10-CM | POA: Diagnosis not present

## 2016-07-21 DIAGNOSIS — D2262 Melanocytic nevi of left upper limb, including shoulder: Secondary | ICD-10-CM | POA: Diagnosis not present

## 2016-07-21 DIAGNOSIS — L218 Other seborrheic dermatitis: Secondary | ICD-10-CM | POA: Diagnosis not present

## 2016-07-21 DIAGNOSIS — D2271 Melanocytic nevi of right lower limb, including hip: Secondary | ICD-10-CM | POA: Diagnosis not present

## 2016-07-21 DIAGNOSIS — L821 Other seborrheic keratosis: Secondary | ICD-10-CM | POA: Diagnosis not present

## 2016-07-27 ENCOUNTER — Other Ambulatory Visit: Payer: Self-pay | Admitting: Orthopedic Surgery

## 2016-07-27 DIAGNOSIS — Z96612 Presence of left artificial shoulder joint: Secondary | ICD-10-CM | POA: Diagnosis not present

## 2016-07-27 DIAGNOSIS — R7982 Elevated C-reactive protein (CRP): Secondary | ICD-10-CM | POA: Diagnosis not present

## 2016-07-27 DIAGNOSIS — M2550 Pain in unspecified joint: Secondary | ICD-10-CM | POA: Diagnosis not present

## 2016-07-27 DIAGNOSIS — Z471 Aftercare following joint replacement surgery: Secondary | ICD-10-CM

## 2016-07-27 DIAGNOSIS — E119 Type 2 diabetes mellitus without complications: Secondary | ICD-10-CM | POA: Diagnosis not present

## 2016-07-27 DIAGNOSIS — Z85820 Personal history of malignant melanoma of skin: Secondary | ICD-10-CM | POA: Diagnosis not present

## 2016-07-27 DIAGNOSIS — R197 Diarrhea, unspecified: Secondary | ICD-10-CM | POA: Diagnosis not present

## 2016-07-27 DIAGNOSIS — R5383 Other fatigue: Secondary | ICD-10-CM | POA: Diagnosis not present

## 2016-07-28 DIAGNOSIS — R197 Diarrhea, unspecified: Secondary | ICD-10-CM | POA: Diagnosis not present

## 2016-07-29 ENCOUNTER — Ambulatory Visit
Admission: RE | Admit: 2016-07-29 | Discharge: 2016-07-29 | Disposition: A | Discharge status: Home / Self Care | Payer: Medicare Other | Source: Ambulatory Visit | Attending: Orthopedic Surgery | Admitting: Orthopedic Surgery

## 2016-07-29 DIAGNOSIS — Z96612 Presence of left artificial shoulder joint: Secondary | ICD-10-CM | POA: Diagnosis not present

## 2016-07-29 DIAGNOSIS — Z471 Aftercare following joint replacement surgery: Secondary | ICD-10-CM

## 2016-07-29 IMAGING — CT CT SHOULDER*L* W/O CM
3 of 4 series · 11 of 20 positions shown, 13 images · non-contrast
Comparison: None.

CLINICAL DATA: History of reverse left shoulder arthroplasty
[DATE] with subsequent infection. The patient underwent
irrigation debridement of the shoulder and polyethylene exchange on
[DATE].

EXAM:
CT OF THE UPPER LEFT EXTREMITY WITHOUT CONTRAST
TECHNIQUE: Multidetector CT imaging of the upper left extremity was performed
according to the standard protocol.

[Series 2: soft tissue · axial · 0.53mm/px · z∈[-587,-403]mm · 5 of 140 slices shown]
[im 24/140  soft-tissue]
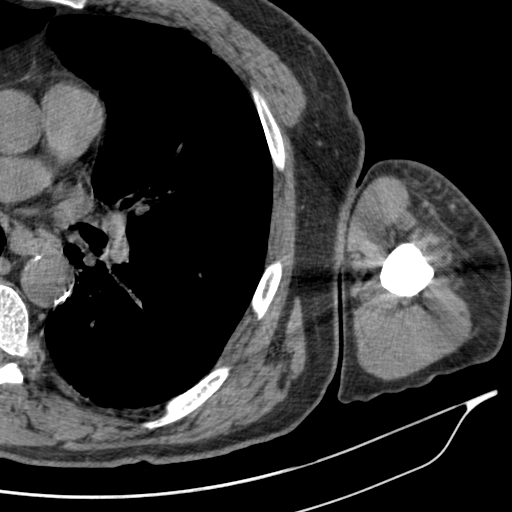
[im 47/140  soft-tissue]
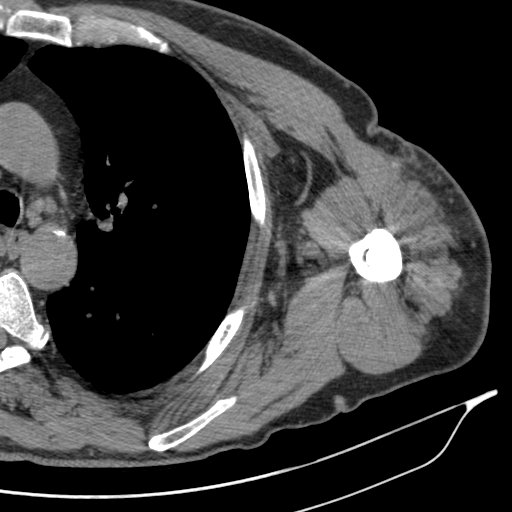
[im 70/140  soft-tissue]
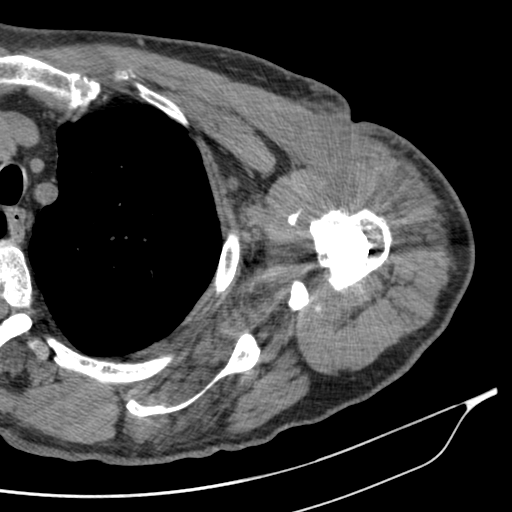
[im 93/140  soft-tissue]
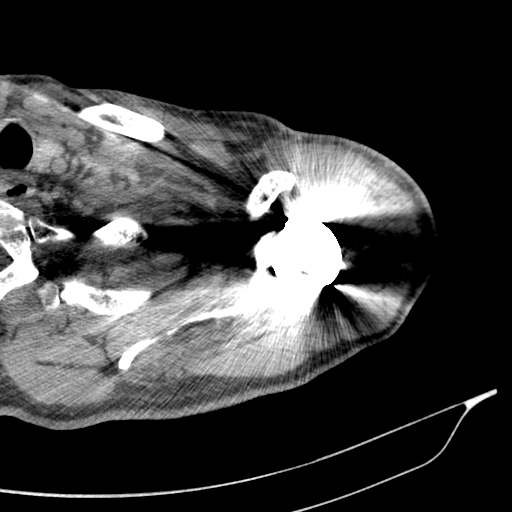
[im 116/140  soft-tissue]
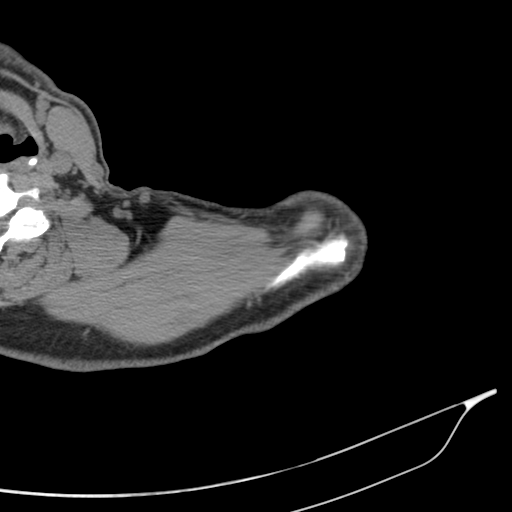

[Series 3: bone (person_name) · axial · 0.53mm/px · z∈[-587,-403]mm · 5 of 140 slices shown, 7 images]
[im 24/140  soft-tissue]
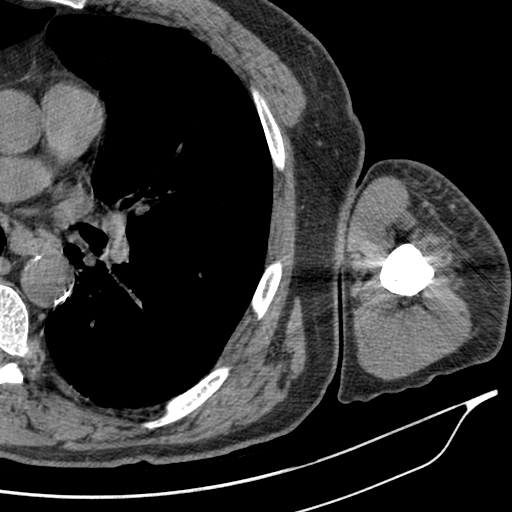
[im 24/140  bone]
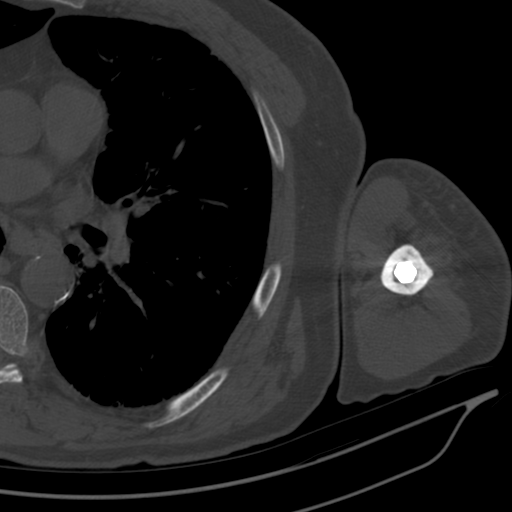
[im 47/140  bone]
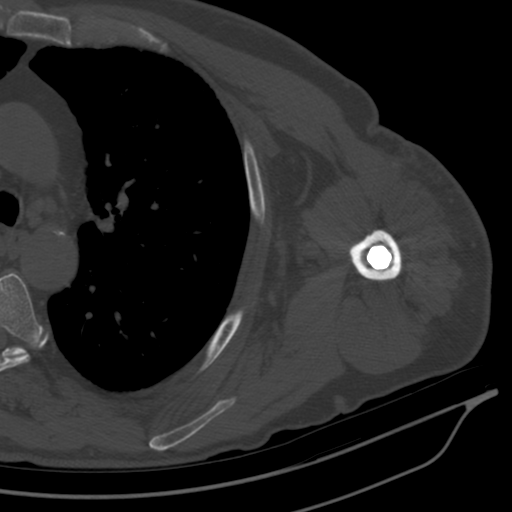
[im 70/140  bone]
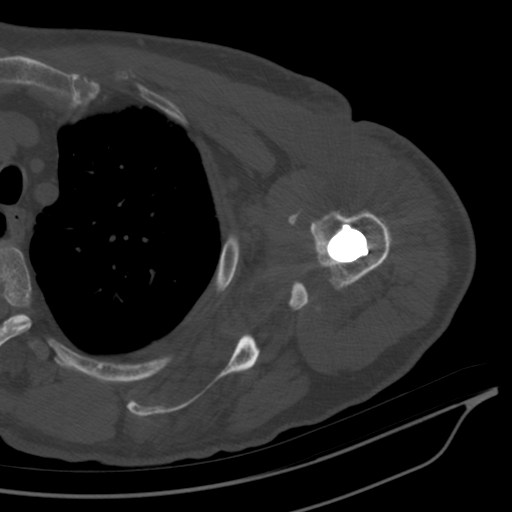
[im 93/140  bone]
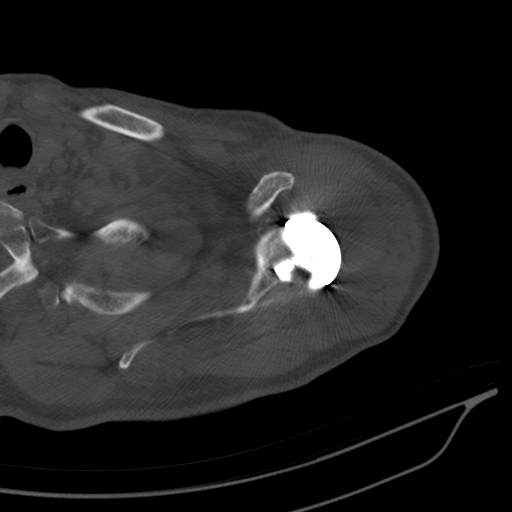
[im 116/140  soft-tissue]
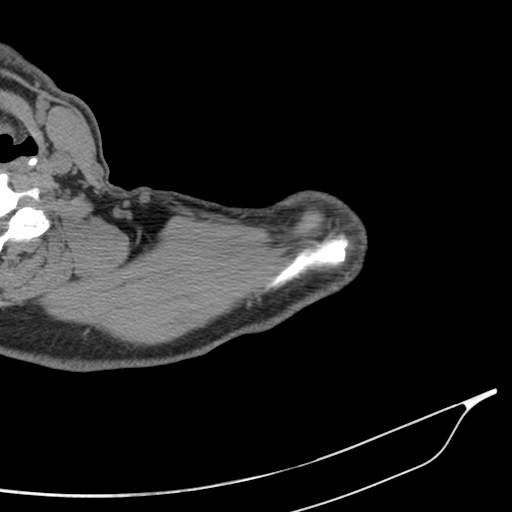
[im 116/140  bone]
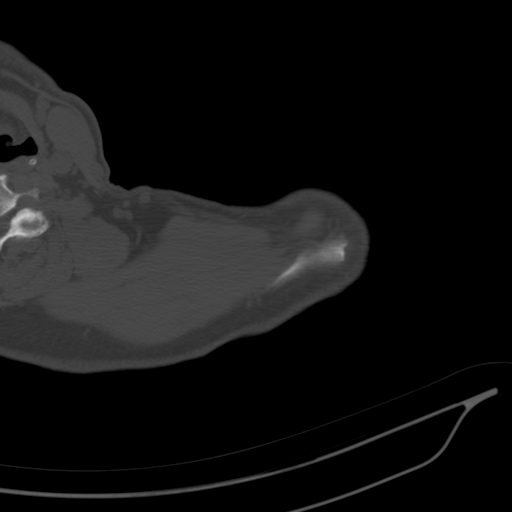

[Series 6: cor bone · coronal · 0.56mm/px · 1 of 123 slices shown]
[im 62/123  bone]
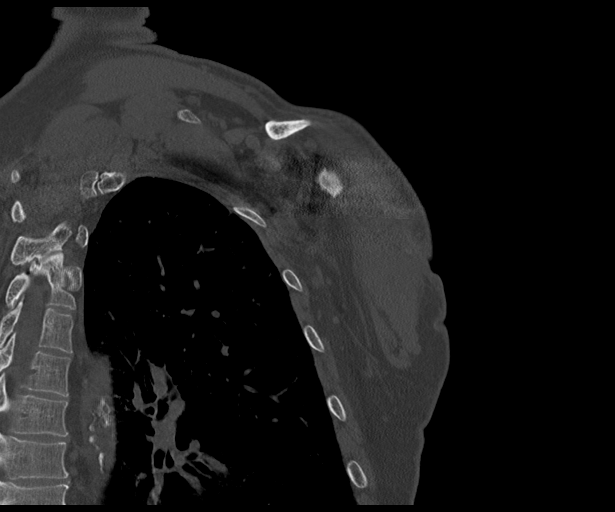

[11 of 20 positions shown; findings below may reference images not displayed]

FINDINGS: Bones/Joint/Cartilage

Reverse shoulder arthroplasty is in place. No lucency about the
hardware to suggest loosening is identified. There is periosteal
reaction about the humeral component which is most conspicuous
anteriorly and laterally. No fracture is identified. The device is
located. An osseous fragment or calcification measuring 1.3 cm
transverse by 2.0 cm craniocaudal by up to 0.4 cm AP is seen in the
coracobrachialis muscle 1 cm anterior to the proximal humerus. The
top of this fragment is at the level of the top of the humeral
component of the patient's shoulder replacement. A second bone
fragment measuring 2.4 cm craniocaudal by 0.9 cm AP by 0.3 cm
transverse is seen posterior to the inferior border of the neck of
the scapula in the teres minor.

Ligaments

Suboptimally assessed by CT.

Muscles and Tendons

Although visualization is somewhat limited by streak artifact from
the patient's shoulder replacement, there is a fluid collection
containing locules of gas which measures approximately 3.5 cm
transverse by 3.0 cm AP by 3.1 cm craniocaudal anterior to the
proximal humerus in the deltoid muscle. The collection appears to
extend into the pectoralis major. There is stranding and overlying
subcutaneous fat. This area of abnormality appears to be deep to a
scar along the anterior aspect of the shoulder and upper arm. No
glenohumeral joint effusion is identified.

Soft tissues

Stranding in subcutaneous fat is seen anterior to the shoulder. The
lungs are emphysematous. Atherosclerosis noted.
IMPRESSION: Study limited by streak artifact demonstrating findings highly
suspicious for a soft tissue abscess with an air and fluid
collection in the anterior aspect of the deltoid and likely
pectoralis major muscles at the level of the proximal humerus.

Periosteal reaction about the proximal humerus anteriorly and
laterally could be due to the patient's prior surgery but is
worrisome for osteomyelitis.

Small bone fragment in the proximal coracobrachialis and teres minor
may represent heterotopic ossification related to prior surgery.

Emphysema.

These results were called by telephone at the time of interpretation
on [DATE] at [DATE] to FRANCINA., who verbally
acknowledged these results.

## 2016-08-03 ENCOUNTER — Other Ambulatory Visit: Payer: Self-pay | Admitting: Orthopedic Surgery

## 2016-08-03 ENCOUNTER — Other Ambulatory Visit (HOSPITAL_COMMUNITY): Payer: Self-pay | Admitting: Physician Assistant

## 2016-08-03 DIAGNOSIS — Z96612 Presence of left artificial shoulder joint: Secondary | ICD-10-CM | POA: Diagnosis not present

## 2016-08-03 DIAGNOSIS — Z471 Aftercare following joint replacement surgery: Secondary | ICD-10-CM

## 2016-08-03 DIAGNOSIS — L02414 Cutaneous abscess of left upper limb: Secondary | ICD-10-CM

## 2016-08-03 NOTE — H&P (Signed)
Frederick Guzman is an 66 y.o. male.    Chief Complaint: left shoulder pain  HPI: Pt is a 66 y.o. male complaining of left shoulder pain for multiple years. Pain had continually increased since the beginning. Pt had previous I&D s/p reverse total several months ago. C/o continue pain and recent CT shows abscess and probable lose component. Pt has tried various conservative treatments which have failed to alleviate their symptoms. Various options are discussed with the patient. Risks, benefits and expectations were discussed with the patient. Patient understand the risks, benefits and expectations and wishes to proceed with surgery.   PCP:  [place], Julie M, NP  D/C Plans: Home  PMH: Past Medical History:  Diagnosis Date  . Arthritis    OA  . Asthma   . Cancer ([place]) 08/2014   melanoma on R shoulder & back - [facility]   . COPD (chronic obstructive pulmonary disease) ([place])   . Depression    pt. denies- 01/2016  . Dyspnea    with exertion  . Fibromyalgia   . GERD (gastroesophageal reflux disease)   . HTN (hypertension)   . Hyperlipemia   . Pneumonia 06/2015  . Sleep apnea    last study - Nov. 2017, waiting for a new machine, using the old CPAP q night after cleaning the device after he was documented to have pneumonia from his CPAP machine.     PSH: Past Surgical History:  Procedure Laterality Date  . IRRIGATION AND DEBRIDEMENT SHOULDER Left 03/31/2016   Procedure: IRRIGATION AND DEBRIDEMENT SHOULDER, deep cultures and polyethylene exchange;  Surgeon: Netta Cedars, MD;  Location: [place];  Service: Orthopedics;  Laterality: Left;  requests 54mins  . REVERSE SHOULDER ARTHROPLASTY Left 01/30/2016   Procedure: LEFT REVERSE SHOULDER ARTHROPLASTY;  Surgeon: Netta Cedars, MD;  Location: [place];  Service: Orthopedics;  Laterality: Left;  . SHOULDER SURGERY Left 2017  . WRIST SURGERY Right 11/2014   Dr. Amedeo Plenty    Social History:  reports that he has been smoking.  He has a 50.00  pack-year smoking history. He has never used smokeless tobacco. He reports that he drinks about 5.4 oz of alcohol per week . He reports that he does not use drugs.  Allergies:  Allergies  Allergen Reactions  . Methotrexate Derivatives Nausea And Vomiting and Other (See Comments)    Mood change  . Ultram [Tramadol Hcl] Nausea And Vomiting    Medications: No current facility-administered medications for this encounter.    Current Outpatient Prescriptions  Medication Sig Dispense Refill  . albuterol (PROAIR HFA) 108 (90 Base) MCG/ACT inhaler Inhale 2 puffs into the lungs every 6 (six) hours as needed for wheezing or shortness of breath.    Marland Kitchen amoxicillin (AMOXIL) 500 MG capsule Take 1 capsule (500 mg total) by mouth 3 (three) times daily. 90 capsule 1  . aspirin EC 81 MG tablet Take 81 mg by mouth at bedtime.    Marland Kitchen atorvastatin (LIPITOR) 20 MG tablet Take 20 mg by mouth at bedtime.  0  . B Complex-C (B-COMPLEX WITH VITAMIN C) tablet Take 1 tablet by mouth daily.    . budesonide-formoterol (SYMBICORT) 160-4.5 MCG/ACT inhaler Inhale 2 puffs into the lungs 2 (two) times daily.    Marland Kitchen doxycycline (VIBRAMYCIN) 100 MG capsule Take 1 capsule (100 mg total) by mouth 2 (two) times daily. One po bid x 7 days 14 capsule 0  . DULoxetine (CYMBALTA) 30 MG capsule Take 30 mg by mouth daily. Take with a 60  mg capsule for a 90 mg dose  0  . DULoxetine (CYMBALTA) 60 MG capsule Take 60 mg by mouth daily. Take with a 30 mg capsule for a 90 mg dose    . esomeprazole (NEXIUM) 20 MG capsule Take 20 mg by mouth daily at 12 noon.    . fluticasone (FLONASE) 50 MCG/ACT nasal spray Place 1 spray into both nostrils daily as needed for allergies.  1  . ipratropium-albuterol (DUONEB) 0.5-2.5 (3) MG/3ML SOLN Take 3 mLs by nebulization every 6 (six) hours as needed. (Patient taking differently: Take 3 mLs by nebulization every 6 (six) hours as needed (for wheezing/shortness of breath). ) 360 mL 1  . lisinopril (PRINIVIL,ZESTRIL)  5 MG tablet Take 5 mg by mouth at bedtime.     . methocarbamol (ROBAXIN) 500 MG tablet Take 1 tablet (500 mg total) by mouth 3 (three) times daily as needed. 60 tablet 1  . Multiple Vitamin (MULTIVITAMIN WITH MINERALS) TABS tablet Take 1 tablet by mouth at bedtime.    . Omega-3 Fatty Acids (FISH OIL) 1000 MG CAPS Take 1,000 mg by mouth daily.    Marland Kitchen oxyCODONE-acetaminophen (PERCOCET) 7.5-325 MG tablet Take 1-2 tablets by mouth every 4 (four) hours as needed for severe pain. 40 tablet 0  . oxyCODONE-acetaminophen (PERCOCET) 7.5-325 MG tablet Take 1 tablet by mouth every 4 (four) hours as needed for severe pain. 60 tablet 0  . rifampin (RIFADIN) 300 MG capsule Take 1 capsule (300 mg total) by mouth 2 (two) times daily. 60 capsule 1  . Tamsulosin HCl (FLOMAX) 0.4 MG CAPS Take 0.4 mg by mouth 2 (two) times daily.     . Testosterone (AXIRON) 30 MG/ACT SOLN Place 90 mg onto the skin daily. 2 pumps under one arm & 1 pump under other arm=3 pumps daily    . vitamin C (ASCORBIC ACID) 500 MG tablet Take 500 mg by mouth daily.      No results found for this or any previous visit (from the past 48 hour(s)). No results found.  ROS: Pain with rom of the left upper extremity  Physical Exam:  Alert and oriented 66 y.o. male in no acute distress Cranial nerves 2-12 intact Cervical spine: full rom with no tenderness, nv intact distally Chest: active breath sounds bilaterally, no wheeze rhonchi or rales Heart: regular rate and rhythm, no murmur Abd: non tender non distended with active bowel sounds Hip is stable with rom  Left shoulder mild pain with rom nv intact distally No rashes or edema  Assessment/Plan Assessment: left shoulder abscess and suspected infection  Plan: Patient will undergo a left shoulder hardware removal, irrigation and debridement and placement of antibiotic spacer by Dr. Veverly Fells at [facility]. Risks benefits and expectations were discussed with the patient. Patient understand  risks, benefits and expectations and wishes to proceed.

## 2016-08-04 ENCOUNTER — Ambulatory Visit
Admission: RE | Admit: 2016-08-04 | Discharge: 2016-08-04 | Disposition: A | Discharge status: Home / Self Care | Payer: Medicare Other | Source: Ambulatory Visit | Attending: Orthopedic Surgery | Admitting: Orthopedic Surgery

## 2016-08-04 DIAGNOSIS — Z96612 Presence of left artificial shoulder joint: Principal | ICD-10-CM

## 2016-08-04 DIAGNOSIS — T847XXD Infection and inflammatory reaction due to other internal orthopedic prosthetic devices, implants and grafts, subsequent encounter: Secondary | ICD-10-CM | POA: Diagnosis not present

## 2016-08-04 DIAGNOSIS — Z471 Aftercare following joint replacement surgery: Secondary | ICD-10-CM | POA: Diagnosis not present

## 2016-08-04 DIAGNOSIS — M25412 Effusion, left shoulder: Secondary | ICD-10-CM | POA: Diagnosis not present

## 2016-08-04 LAB — SYNOVIAL CELL COUNT + DIFF, W/ CRYSTALS
Basophils, %: 0 %
Eosinophils-Synovial: 0 % (ref 0–2)
Lymphocytes-Synovial Fld: 13 % (ref 0–74)
Monocyte/Macrophage: 0 % (ref 0–69)
NEUTROPHIL, SYNOVIAL: 87 % — AB (ref 0–24)
SYNOVIOCYTES, %: 0 % (ref 0–15)
WBC, SYNOVIAL: 180 {cells}/uL — AB (ref <150)

## 2016-08-04 IMAGING — XA DG FLUORO GUIDE NDL PLC/BX
1 series · 2 of 2 positions shown · non-contrast
Comparison: none

CLINICAL DATA: Recurrent left shoulder effusion. Left total
shoulder arthroplasty.

[Series 1: ortho standard · 2 of 2 slices shown]
[im 1/2]
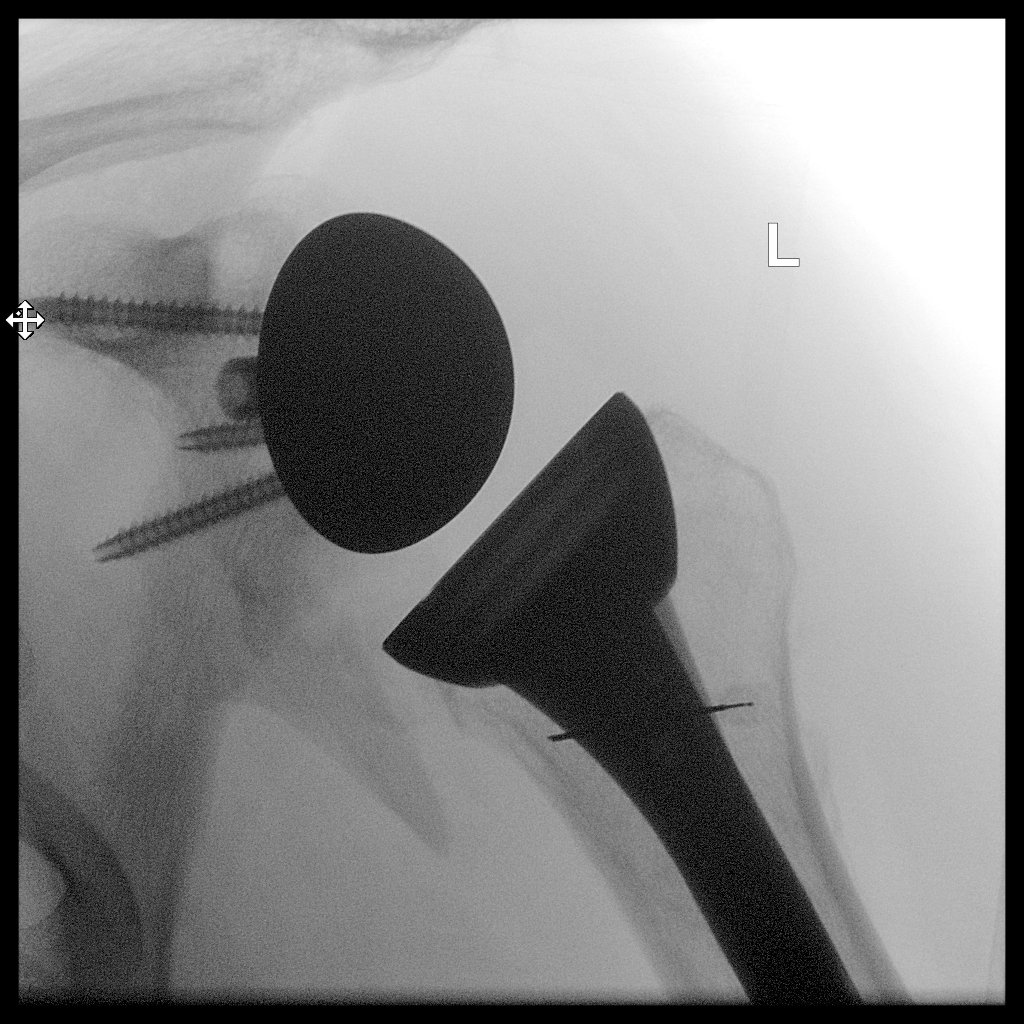
[im 2/2]
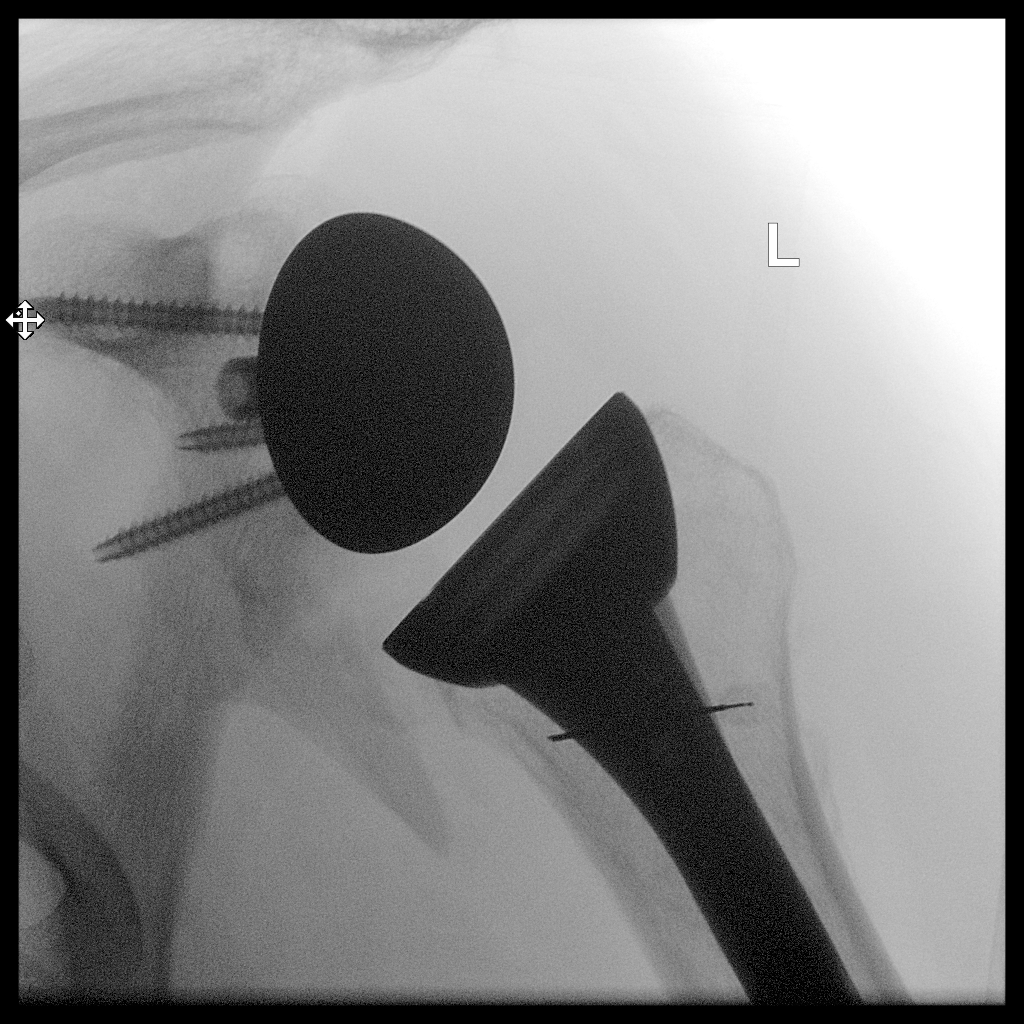

[2 of 2 positions shown; findings below may reference images not displayed]

FLUOROSCOPY TIME:  Radiation Exposure Index (as provided by the
fluoroscopic device): 54.34 uGy*m2

Fluoroscopy Time:  34 seconds

Number of Acquired Images:  0

PROCEDURE:
Left SHOULDER ASPIRATION UNDER FLUOROSCOPY

An appropriate skin entrance site was determined that the level of
the humeral component of the arthroplasty. This corresponds with the
fluid collection on CT. The site was marked, prepped with Betadine,
draped in the usual sterile fashion, and infiltrated locally with 1%
Lidocaine. A 22 gauge spinal needle was advanced to the superomedial
margin of the residual humerus, just the low the cup of the
prosthesis under intermittent fluoroscopy.

I was then able to aspirate approximately 1 mL of sanguinous
material. I reposition the needle more inferiorly and medially and
was able to acquire sanguinous purulent material.

A total of 4 mL was collected.  Samples were sent as requested.

The patient tolerated the procedure without immediate complication.
IMPRESSION: Technically successful left shoulder aspiration.

These results were called by telephone at the time of interpretation
on [DATE] at [DATE] to Dr. ASMAT , who verbally
acknowledged these results.

## 2016-08-05 ENCOUNTER — Encounter (HOSPITAL_COMMUNITY): Payer: Self-pay

## 2016-08-05 ENCOUNTER — Encounter (HOSPITAL_COMMUNITY)
Admission: RE | Admit: 2016-08-05 | Discharge: 2016-08-05 | Disposition: A | Discharge status: Home / Self Care | Payer: Medicare Other | Source: Ambulatory Visit | Attending: Orthopedic Surgery | Admitting: Orthopedic Surgery

## 2016-08-05 ENCOUNTER — Ambulatory Visit (HOSPITAL_COMMUNITY): Payer: Medicare Other

## 2016-08-05 ENCOUNTER — Encounter (HOSPITAL_COMMUNITY): Payer: Self-pay | Admitting: *Deleted

## 2016-08-05 DIAGNOSIS — L089 Local infection of the skin and subcutaneous tissue, unspecified: Secondary | ICD-10-CM | POA: Diagnosis not present

## 2016-08-05 DIAGNOSIS — Z01812 Encounter for preprocedural laboratory examination: Secondary | ICD-10-CM | POA: Insufficient documentation

## 2016-08-05 HISTORY — DX: Essential (primary) hypertension: I10

## 2016-08-05 LAB — CBC
HCT: 43.8 % (ref 39.0–52.0)
Hemoglobin: 15.3 g/dL (ref 13.0–17.0)
MCH: 31.6 pg (ref 26.0–34.0)
MCHC: 34.9 g/dL (ref 30.0–36.0)
MCV: 90.5 fL (ref 78.0–100.0)
Platelets: 321 10*3/uL (ref 150–400)
RBC: 4.84 MIL/uL (ref 4.22–5.81)
RDW: 12.9 % (ref 11.5–15.5)
WBC: 10.5 10*3/uL (ref 4.0–10.5)

## 2016-08-05 LAB — BASIC METABOLIC PANEL
Anion gap: 10 (ref 5–15)
BUN: 5 mg/dL — ABNORMAL LOW (ref 6–20)
CALCIUM: 8.9 mg/dL (ref 8.9–10.3)
CO2: 25 mmol/L (ref 22–32)
CREATININE: 0.93 mg/dL (ref 0.61–1.24)
Chloride: 99 mmol/L — ABNORMAL LOW (ref 101–111)
GFR calc non Af Amer: >60 mL/min (ref >60)
Glucose, Bld: 187 mg/dL — ABNORMAL HIGH (ref 65–99)
Potassium: 3.8 mmol/L (ref 3.5–5.1)
Sodium: 134 mmol/L — ABNORMAL LOW (ref 135–145)

## 2016-08-05 NOTE — Pre-Procedure Instructions (Signed)
    Frederick Guzman  08/05/2016    Your procedure is scheduled on Monday, May 21.  Report to [facility] Admitting at 12:30 PM                Your surgery or procedure is scheduled for 2:30 PM   Call this number if you have problems the morning of surgery: [phone]               For any other questions, please call [phone], Monday - Friday 8 AM - 4 PM.     Remember:  Do not eat food or drink liquids after midnight Sunday, May 20.  Take these medicines the morning of surgery with A SIP OF WATER: cetirizine (ZYRTEC), DULoxetine (CYMBALTA), finasteride (PROSCAR), Tamsulosin HCl (FLOMAX). Use Symbicort Inhaler.  May use Albuterol nebulizer or Inhaler- Bring Inhaler to the hospital with you.   Do not wear jewelry, make-up or nail polish.  Do not wear lotions, powders, or perfumes, or deodorant.  Do not shave 48 hours prior to surgery.  Men may shave face and neck.  Do not bring valuables to the hospital.  [facility] is not responsible for any belongings or valuables.  Contacts, dentures or bridgework may not be worn into surgery.  Leave your suitcase in the car.  After surgery it may be brought to your room.  For patients admitted to the hospital, discharge time will be determined by your treatment team.  Patients discharged the day of surgery will not be allowed to drive home.   Name and phone number of your driver:  -  Special instructions:  : Review  [place] - Preparing For Surgery.  Please read over the following fact sheets that you were given: [facility]- Preparing For Surgery and Patient Instructions for Mupirocin Application, Incentive Spirometry, Pain Booklet, Surgical Site Infection

## 2016-08-05 NOTE — Progress Notes (Signed)
Frederick Guzman denies chest pain, or shortness of breath at rest.  Patient has COPD, rarely uses rescue

## 2016-08-05 NOTE — Progress Notes (Signed)
Mr Magowan reported that Dr Veverly Fells instructed him, nothing by mouth after midnight, except liquids until; 0600. Mr. Salway correctly told me what clear liquids are.

## 2016-08-08 LAB — BODY FLUID CULTURE
Gram Stain: NONE SEEN
Organism ID, Bacteria: NO GROWTH

## 2016-08-09 ENCOUNTER — Ambulatory Visit (HOSPITAL_COMMUNITY): Payer: Medicare Other | Admitting: Certified Registered Nurse Anesthetist

## 2016-08-09 ENCOUNTER — Inpatient Hospital Stay (HOSPITAL_COMMUNITY): Payer: Medicare Other

## 2016-08-09 ENCOUNTER — Encounter (HOSPITAL_COMMUNITY)
Admission: RE | Disposition: A | Discharge status: Home / Self Care | Payer: Self-pay | Source: Ambulatory Visit | Attending: Orthopedic Surgery

## 2016-08-09 ENCOUNTER — Inpatient Hospital Stay (HOSPITAL_COMMUNITY)
Admission: RE | Admit: 2016-08-09 | Discharge: 2016-08-12 | DRG: 483 | Disposition: A | Discharge status: Home / Self Care | Payer: Medicare Other | Source: Ambulatory Visit | Attending: Orthopedic Surgery | Admitting: Orthopedic Surgery

## 2016-08-09 ENCOUNTER — Encounter (HOSPITAL_COMMUNITY): Payer: Self-pay | Admitting: Certified Registered Nurse Anesthetist

## 2016-08-09 DIAGNOSIS — T849XXA Unspecified complication of internal orthopedic prosthetic device, implant and graft, initial encounter: Secondary | ICD-10-CM | POA: Diagnosis not present

## 2016-08-09 DIAGNOSIS — Z96612 Presence of left artificial shoulder joint: Secondary | ICD-10-CM | POA: Diagnosis not present

## 2016-08-09 DIAGNOSIS — T8459XD Infection and inflammatory reaction due to other internal joint prosthesis, subsequent encounter: Secondary | ICD-10-CM

## 2016-08-09 DIAGNOSIS — G4733 Obstructive sleep apnea (adult) (pediatric): Secondary | ICD-10-CM | POA: Diagnosis not present

## 2016-08-09 DIAGNOSIS — D62 Acute posthemorrhagic anemia: Secondary | ICD-10-CM | POA: Diagnosis not present

## 2016-08-09 DIAGNOSIS — J449 Chronic obstructive pulmonary disease, unspecified: Secondary | ICD-10-CM | POA: Diagnosis not present

## 2016-08-09 DIAGNOSIS — G4730 Sleep apnea, unspecified: Secondary | ICD-10-CM | POA: Diagnosis not present

## 2016-08-09 DIAGNOSIS — Z452 Encounter for adjustment and management of vascular access device: Secondary | ICD-10-CM | POA: Diagnosis not present

## 2016-08-09 DIAGNOSIS — Z79899 Other long term (current) drug therapy: Secondary | ICD-10-CM | POA: Diagnosis not present

## 2016-08-09 DIAGNOSIS — Z5181 Encounter for therapeutic drug level monitoring: Secondary | ICD-10-CM | POA: Diagnosis not present

## 2016-08-09 DIAGNOSIS — F17210 Nicotine dependence, cigarettes, uncomplicated: Secondary | ICD-10-CM | POA: Diagnosis present

## 2016-08-09 DIAGNOSIS — E785 Hyperlipidemia, unspecified: Secondary | ICD-10-CM | POA: Diagnosis not present

## 2016-08-09 DIAGNOSIS — K5900 Constipation, unspecified: Secondary | ICD-10-CM | POA: Diagnosis not present

## 2016-08-09 DIAGNOSIS — J45909 Unspecified asthma, uncomplicated: Secondary | ICD-10-CM | POA: Diagnosis not present

## 2016-08-09 DIAGNOSIS — Y831 Surgical operation with implant of artificial internal device as the cause of abnormal reaction of the patient, or of later complication, without mention of misadventure at the time of the procedure: Secondary | ICD-10-CM | POA: Diagnosis present

## 2016-08-09 DIAGNOSIS — K219 Gastro-esophageal reflux disease without esophagitis: Secondary | ICD-10-CM | POA: Diagnosis present

## 2016-08-09 DIAGNOSIS — F329 Major depressive disorder, single episode, unspecified: Secondary | ICD-10-CM | POA: Diagnosis present

## 2016-08-09 DIAGNOSIS — Z7951 Long term (current) use of inhaled steroids: Secondary | ICD-10-CM | POA: Diagnosis not present

## 2016-08-09 DIAGNOSIS — Z9889 Other specified postprocedural states: Secondary | ICD-10-CM

## 2016-08-09 DIAGNOSIS — Z792 Long term (current) use of antibiotics: Secondary | ICD-10-CM | POA: Diagnosis not present

## 2016-08-09 DIAGNOSIS — M797 Fibromyalgia: Secondary | ICD-10-CM | POA: Diagnosis not present

## 2016-08-09 DIAGNOSIS — Z7982 Long term (current) use of aspirin: Secondary | ICD-10-CM

## 2016-08-09 DIAGNOSIS — T8459XA Infection and inflammatory reaction due to other internal joint prosthesis, initial encounter: Secondary | ICD-10-CM | POA: Diagnosis not present

## 2016-08-09 DIAGNOSIS — M25512 Pain in left shoulder: Secondary | ICD-10-CM | POA: Diagnosis not present

## 2016-08-09 DIAGNOSIS — I10 Essential (primary) hypertension: Secondary | ICD-10-CM | POA: Diagnosis not present

## 2016-08-09 DIAGNOSIS — Z85820 Personal history of malignant melanoma of skin: Secondary | ICD-10-CM

## 2016-08-09 DIAGNOSIS — Z96619 Presence of unspecified artificial shoulder joint: Secondary | ICD-10-CM

## 2016-08-09 DIAGNOSIS — L03114 Cellulitis of left upper limb: Secondary | ICD-10-CM | POA: Diagnosis not present

## 2016-08-09 DIAGNOSIS — G8918 Other acute postprocedural pain: Secondary | ICD-10-CM | POA: Diagnosis not present

## 2016-08-09 DIAGNOSIS — J439 Emphysema, unspecified: Secondary | ICD-10-CM | POA: Diagnosis present

## 2016-08-09 HISTORY — DX: Diverticulitis of intestine, part unspecified, without perforation or abscess without bleeding: K57.92

## 2016-08-09 HISTORY — PX: EXCISIONAL TOTAL SHOULDER ARTHROPLASTY WITH ANTIBIOTIC SPACER: SHX6264

## 2016-08-09 HISTORY — DX: Other specified postprocedural states: Z98.890

## 2016-08-09 LAB — ABO/RH: ABO/RH(D): A NEG

## 2016-08-09 LAB — CBC
HCT: 34.4 % — ABNORMAL LOW (ref 39.0–52.0)
HEMOGLOBIN: 11.3 g/dL — AB (ref 13.0–17.0)
MCH: 30.5 pg (ref 26.0–34.0)
MCHC: 32.8 g/dL (ref 30.0–36.0)
MCV: 93 fL (ref 78.0–100.0)
Platelets: 271 10*3/uL (ref 150–400)
RBC: 3.7 MIL/uL — ABNORMAL LOW (ref 4.22–5.81)
RDW: 13 % (ref 11.5–15.5)
WBC: 11.3 10*3/uL — ABNORMAL HIGH (ref 4.0–10.5)

## 2016-08-09 LAB — TYPE AND SCREEN
ABO/RH(D): A NEG
ANTIBODY SCREEN: NEGATIVE

## 2016-08-09 IMAGING — CR DG SHOULDER 1V*L*
2 series · 2 of 2 positions shown · non-contrast
Comparison: [DATE] CT.

CLINICAL DATA: 66-year-old male with hardware removal with
placement of antibiotic spacer. Subsequent encounter.

EXAM:
LEFT SHOULDER - 1 VIEW

[AP (1 of 2)]
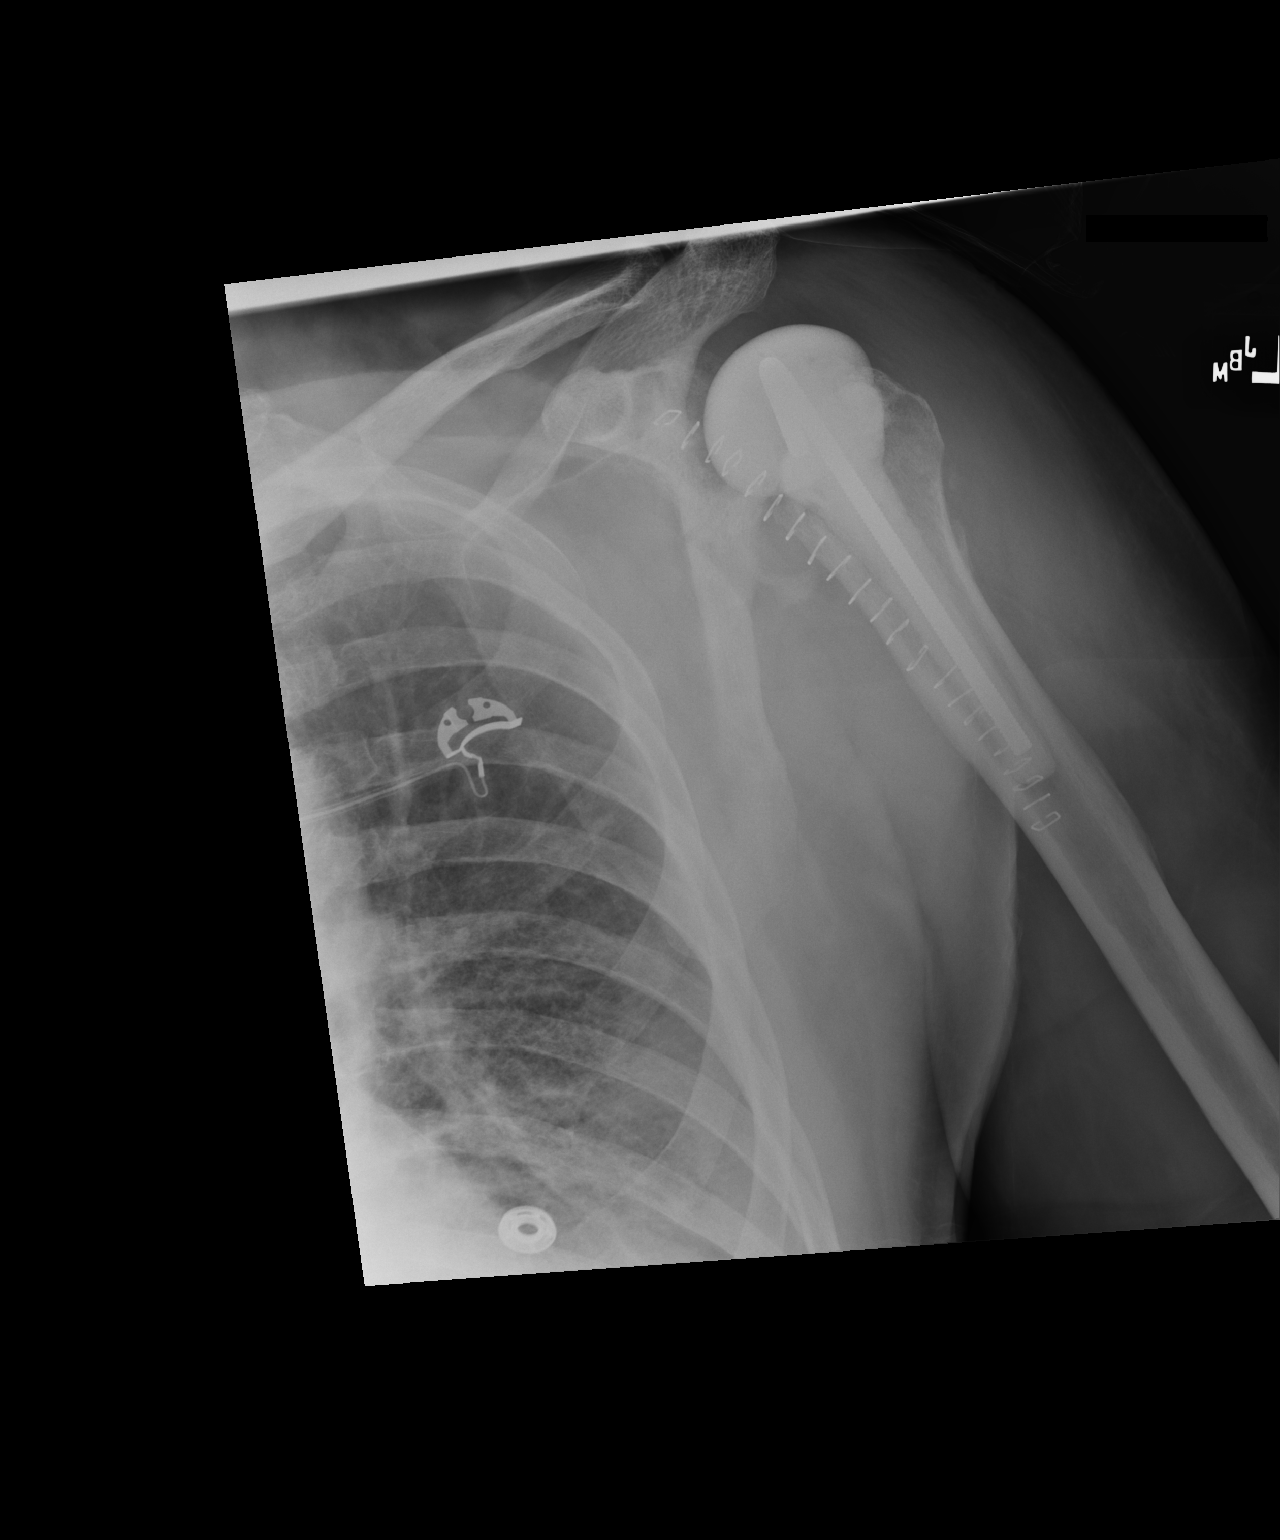

[AP (2 of 2)]
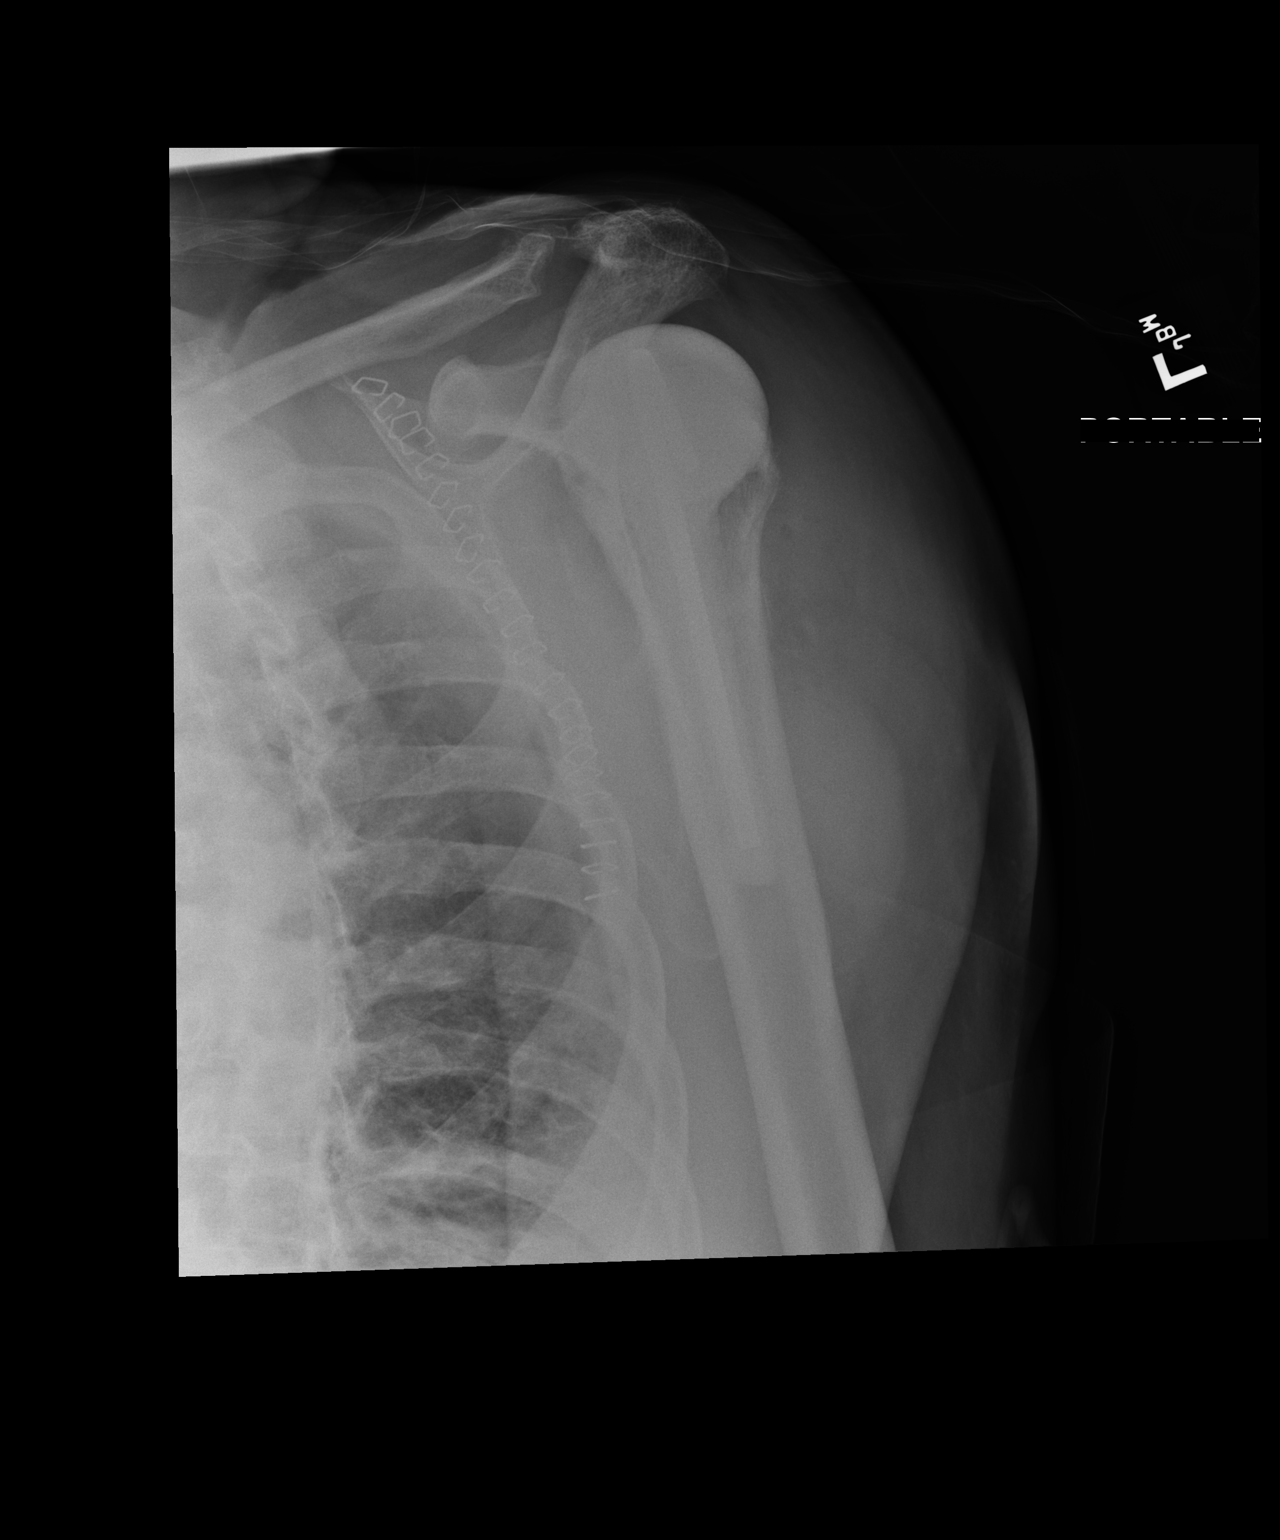

[2 of 2 positions shown; findings below may reference images not displayed]

FINDINGS: Left shoulder prosthesis removed and antibiotic spacer placed. No
complication detected.

Acromioclavicular joint degenerative changes.

Visualized lungs clear.
IMPRESSION: Left shoulder prosthesis removed and antibiotic spacer placed. No
complication detected.

## 2016-08-09 SURGERY — REMOVAL, HARDWARE, SHOULDER, WITH IRRIGATION, DEBRIDEMENT, AND INSERTION OF ANTIBIOTIC BEADS OR ANTIBIOTIC SPACER
Anesthesia: General | Site: Shoulder | Laterality: Left

## 2016-08-09 MED ORDER — FENTANYL CITRATE (PF) 250 MCG/5ML IJ SOLN
INTRAMUSCULAR | Status: AC
  Filled 2016-08-09: qty 5

## 2016-08-09 MED ORDER — ADULT MULTIVITAMIN W/MINERALS CH
1.0000 | ORAL_TABLET | Freq: Every day | ORAL | Status: DC
  Administered 2016-08-11: 1 via ORAL
  Filled 2016-08-09 (×3): qty 1

## 2016-08-09 MED ORDER — IPRATROPIUM-ALBUTEROL 0.5-2.5 (3) MG/3ML IN SOLN: 3.0000 mL | Freq: Four times a day (QID) | RESPIRATORY_TRACT | Status: DC | PRN

## 2016-08-09 MED ORDER — PHENYLEPHRINE HCL 10 MG/ML IJ SOLN
INTRAVENOUS | Status: DC | PRN
  Administered 2016-08-09: 45 ug/min via INTRAVENOUS
  Administered 2016-08-09: 17:00:00 via INTRAVENOUS

## 2016-08-09 MED ORDER — PROPOFOL 10 MG/ML IV BOLUS
INTRAVENOUS | Status: DC | PRN
  Administered 2016-08-09: 150 mg via INTRAVENOUS
  Administered 2016-08-09: 10 mg via INTRAVENOUS

## 2016-08-09 MED ORDER — TRANEXAMIC ACID 1000 MG/10ML IV SOLN
1000.0000 mg | INTRAVENOUS | Status: AC
  Administered 2016-08-09: 1000 mg via INTRAVENOUS
  Filled 2016-08-09: qty 10

## 2016-08-09 MED ORDER — TRANEXAMIC ACID 1000 MG/10ML IV SOLN
1000.0000 mg | Freq: Once | INTRAVENOUS | Status: AC
  Administered 2016-08-09: 1000 mg via INTRAVENOUS
  Filled 2016-08-09: qty 10

## 2016-08-09 MED ORDER — MIDAZOLAM HCL 2 MG/2ML IJ SOLN
INTRAMUSCULAR | Status: AC
  Administered 2016-08-09: 2 mg via INTRAVENOUS
  Filled 2016-08-09: qty 2

## 2016-08-09 MED ORDER — MIDAZOLAM HCL 2 MG/2ML IJ SOLN
2.0000 mg | Freq: Once | INTRAMUSCULAR | Status: AC
  Administered 2016-08-09: 2 mg via INTRAVENOUS

## 2016-08-09 MED ORDER — ARTIFICIAL TEARS OPHTHALMIC OINT
TOPICAL_OINTMENT | OPHTHALMIC | Status: AC
  Filled 2016-08-09: qty 3.5

## 2016-08-09 MED ORDER — FLUTICASONE PROPIONATE 50 MCG/ACT NA SUSP
1.0000 | Freq: Every day | NASAL | Status: DC | PRN
  Filled 2016-08-09: qty 16

## 2016-08-09 MED ORDER — CALCIUM CHLORIDE 10 % IV SOLN
INTRAVENOUS | Status: DC | PRN
  Administered 2016-08-09: 200 mg via INTRAVENOUS

## 2016-08-09 MED ORDER — LIDOCAINE 2% (20 MG/ML) 5 ML SYRINGE
INTRAMUSCULAR | Status: AC
  Filled 2016-08-09: qty 5

## 2016-08-09 MED ORDER — SILDENAFIL CITRATE 20 MG PO TABS
20.0000 mg | ORAL_TABLET | Freq: Three times a day (TID) | ORAL | Status: DC
  Administered 2016-08-11: 20 mg via ORAL
  Filled 2016-08-09 (×9): qty 1

## 2016-08-09 MED ORDER — VANCOMYCIN HCL 1000 MG IV SOLR
INTRAVENOUS | Status: AC
  Filled 2016-08-09: qty 1000

## 2016-08-09 MED ORDER — PHENOL 1.4 % MT LIQD
1.0000 | OROMUCOSAL | Status: DC | PRN
Start: 2016-08-09 — End: 2016-08-12

## 2016-08-09 MED ORDER — LACTATED RINGERS IV SOLN
INTRAVENOUS | Status: DC
  Administered 2016-08-09: 10 mL/h via INTRAVENOUS

## 2016-08-09 MED ORDER — SODIUM CHLORIDE 0.9 % IV SOLN
INTRAVENOUS | Status: DC
  Administered 2016-08-09 – 2016-08-12 (×2): via INTRAVENOUS

## 2016-08-09 MED ORDER — CEFAZOLIN SODIUM 1 G IJ SOLR
INTRAMUSCULAR | Status: AC
  Filled 2016-08-09: qty 20

## 2016-08-09 MED ORDER — ACETAMINOPHEN 650 MG RE SUPP: 650.0000 mg | Freq: Four times a day (QID) | RECTAL | Status: DC | PRN

## 2016-08-09 MED ORDER — DOCUSATE SODIUM 100 MG PO CAPS
100.0000 mg | ORAL_CAPSULE | Freq: Two times a day (BID) | ORAL | Status: DC
  Administered 2016-08-10 – 2016-08-12 (×5): 100 mg via ORAL
  Filled 2016-08-09 (×6): qty 1

## 2016-08-09 MED ORDER — MIDAZOLAM HCL 2 MG/2ML IJ SOLN
INTRAMUSCULAR | Status: AC
  Filled 2016-08-09: qty 2

## 2016-08-09 MED ORDER — CEFAZOLIN SODIUM-DEXTROSE 2-3 GM-% IV SOLR
INTRAVENOUS | Status: DC | PRN
  Administered 2016-08-09: 2 g via INTRAVENOUS

## 2016-08-09 MED ORDER — FENTANYL CITRATE (PF) 100 MCG/2ML IJ SOLN
INTRAMUSCULAR | Status: AC
  Administered 2016-08-09: 50 ug via INTRAVENOUS
  Filled 2016-08-09: qty 2

## 2016-08-09 MED ORDER — PANTOPRAZOLE SODIUM 40 MG PO TBEC
80.0000 mg | DELAYED_RELEASE_TABLET | Freq: Every day | ORAL | Status: DC
  Administered 2016-08-10 – 2016-08-11 (×2): 80 mg via ORAL

## 2016-08-09 MED ORDER — FINASTERIDE 5 MG PO TABS
5.0000 mg | ORAL_TABLET | Freq: Every day | ORAL | Status: DC
  Administered 2016-08-09 – 2016-08-12 (×4): 5 mg via ORAL
  Filled 2016-08-09 (×5): qty 1

## 2016-08-09 MED ORDER — ASPIRIN EC 81 MG PO TBEC
81.0000 mg | DELAYED_RELEASE_TABLET | Freq: Every day | ORAL | Status: DC
  Administered 2016-08-10 – 2016-08-11 (×2): 81 mg via ORAL
  Filled 2016-08-09 (×3): qty 1

## 2016-08-09 MED ORDER — EPHEDRINE 5 MG/ML INJ
INTRAVENOUS | Status: AC
  Filled 2016-08-09: qty 10

## 2016-08-09 MED ORDER — LISINOPRIL 5 MG PO TABS
5.0000 mg | ORAL_TABLET | Freq: Every day | ORAL | Status: DC
  Administered 2016-08-11: 5 mg via ORAL
  Filled 2016-08-09 (×3): qty 1

## 2016-08-09 MED ORDER — TESTOSTERONE 30 MG/ACT TD SOLN: 60.0000 mg | Freq: Every day | TRANSDERMAL | Status: DC

## 2016-08-09 MED ORDER — ARTIFICIAL TEARS OPHTHALMIC OINT
TOPICAL_OINTMENT | OPHTHALMIC | Status: DC | PRN
  Administered 2016-08-09: 1 via OPHTHALMIC

## 2016-08-09 MED ORDER — POLYETHYLENE GLYCOL 3350 17 G PO PACK: 17.0000 g | PACK | Freq: Every day | ORAL | Status: DC | PRN

## 2016-08-09 MED ORDER — CEFAZOLIN SODIUM-DEXTROSE 2-4 GM/100ML-% IV SOLN
2.0000 g | Freq: Three times a day (TID) | INTRAVENOUS | Status: DC
  Administered 2016-08-09 – 2016-08-12 (×8): 2 g via INTRAVENOUS
  Filled 2016-08-09 (×10): qty 100

## 2016-08-09 MED ORDER — ONDANSETRON HCL 4 MG/2ML IJ SOLN
INTRAMUSCULAR | Status: AC
  Filled 2016-08-09: qty 6

## 2016-08-09 MED ORDER — TAMSULOSIN HCL 0.4 MG PO CAPS
0.4000 mg | ORAL_CAPSULE | Freq: Two times a day (BID) | ORAL | Status: DC
  Administered 2016-08-09 – 2016-08-12 (×6): 0.4 mg via ORAL
  Filled 2016-08-09 (×6): qty 1

## 2016-08-09 MED ORDER — METHOCARBAMOL 500 MG PO TABS
500.0000 mg | ORAL_TABLET | Freq: Four times a day (QID) | ORAL | Status: DC | PRN
  Filled 2016-08-09: qty 1

## 2016-08-09 MED ORDER — EPHEDRINE SULFATE 50 MG/ML IJ SOLN
INTRAMUSCULAR | Status: DC | PRN
  Administered 2016-08-09 (×3): 10 mg via INTRAVENOUS

## 2016-08-09 MED ORDER — ALBUMIN HUMAN 5 % IV SOLN
INTRAVENOUS | Status: DC | PRN
  Administered 2016-08-09 (×3): via INTRAVENOUS

## 2016-08-09 MED ORDER — LIDOCAINE HCL (CARDIAC) 20 MG/ML IV SOLN
INTRAVENOUS | Status: DC | PRN
  Administered 2016-08-09: 20 mg via INTRAVENOUS
  Administered 2016-08-09: 60 mg via INTRAVENOUS

## 2016-08-09 MED ORDER — TRANEXAMIC ACID 1000 MG/10ML IV SOLN
2000.0000 mg | Freq: Once | INTRAVENOUS | Status: DC
  Filled 2016-08-09: qty 20

## 2016-08-09 MED ORDER — PHENYLEPHRINE HCL 10 MG/ML IJ SOLN
INTRAMUSCULAR | Status: AC
  Filled 2016-08-09: qty 2

## 2016-08-09 MED ORDER — MENTHOL 3 MG MT LOZG: 1.0000 | LOZENGE | OROMUCOSAL | Status: DC | PRN

## 2016-08-09 MED ORDER — DULOXETINE HCL 30 MG PO CPEP: 30.0000 mg | ORAL_CAPSULE | Freq: Every day | ORAL | Status: DC

## 2016-08-09 MED ORDER — FENTANYL CITRATE (PF) 100 MCG/2ML IJ SOLN
INTRAMUSCULAR | Status: DC | PRN
Start: 2016-08-09 — End: 2016-08-09
  Administered 2016-08-09 (×2): 50 ug via INTRAVENOUS

## 2016-08-09 MED ORDER — METOCLOPRAMIDE HCL 5 MG/ML IJ SOLN: 5.0000 mg | Freq: Three times a day (TID) | INTRAMUSCULAR | Status: DC | PRN

## 2016-08-09 MED ORDER — CHLORHEXIDINE GLUCONATE 4 % EX LIQD: 60.0000 mL | Freq: Once | CUTANEOUS | Status: DC

## 2016-08-09 MED ORDER — ACETAMINOPHEN 325 MG PO TABS: 650.0000 mg | ORAL_TABLET | Freq: Four times a day (QID) | ORAL | Status: DC | PRN

## 2016-08-09 MED ORDER — DULOXETINE HCL 60 MG PO CPEP: 60.0000 mg | ORAL_CAPSULE | Freq: Every day | ORAL | Status: DC

## 2016-08-09 MED ORDER — ATORVASTATIN CALCIUM 20 MG PO TABS
20.0000 mg | ORAL_TABLET | ORAL | Status: DC
  Administered 2016-08-10: 20 mg via ORAL
  Filled 2016-08-09 (×3): qty 1

## 2016-08-09 MED ORDER — VANCOMYCIN HCL 500 MG IV SOLR
INTRAVENOUS | Status: DC | PRN
  Administered 2016-08-09: 1000 mg

## 2016-08-09 MED ORDER — TRANEXAMIC ACID 1000 MG/10ML IV SOLN
INTRAVENOUS | Status: AC | PRN
  Administered 2016-08-09: 2000 mg via INTRAVENOUS

## 2016-08-09 MED ORDER — LORATADINE 10 MG PO TABS
10.0000 mg | ORAL_TABLET | Freq: Every day | ORAL | Status: DC
  Administered 2016-08-11: 10 mg via ORAL
  Filled 2016-08-09 (×3): qty 1

## 2016-08-09 MED ORDER — BUPIVACAINE-EPINEPHRINE (PF) 0.5% -1:200000 IJ SOLN
INTRAMUSCULAR | Status: DC | PRN
  Administered 2016-08-09: 25 mL via PERINEURAL

## 2016-08-09 MED ORDER — OMEGA-3-ACID ETHYL ESTERS 1 G PO CAPS
1.0000 g | ORAL_CAPSULE | Freq: Every day | ORAL | Status: DC
  Administered 2016-08-11: 1 g via ORAL
  Filled 2016-08-09 (×5): qty 1

## 2016-08-09 MED ORDER — PHENYLEPHRINE 40 MCG/ML (10ML) SYRINGE FOR IV PUSH (FOR BLOOD PRESSURE SUPPORT)
PREFILLED_SYRINGE | INTRAVENOUS | Status: AC
  Filled 2016-08-09: qty 10

## 2016-08-09 MED ORDER — PHENYLEPHRINE HCL 10 MG/ML IJ SOLN
INTRAMUSCULAR | Status: DC | PRN
  Administered 2016-08-09 (×4): 80 ug via INTRAVENOUS

## 2016-08-09 MED ORDER — ONDANSETRON HCL 4 MG PO TABS: 4.0000 mg | ORAL_TABLET | Freq: Four times a day (QID) | ORAL | Status: DC | PRN

## 2016-08-09 MED ORDER — SUCCINYLCHOLINE CHLORIDE 20 MG/ML IJ SOLN
INTRAMUSCULAR | Status: DC | PRN
  Administered 2016-08-09: 120 mg via INTRAVENOUS

## 2016-08-09 MED ORDER — 0.9 % SODIUM CHLORIDE (POUR BTL) OPTIME
TOPICAL | Status: DC | PRN
Start: 2016-08-09 — End: 2016-08-09
  Administered 2016-08-09: 1000 mL

## 2016-08-09 MED ORDER — ALBUTEROL SULFATE (2.5 MG/3ML) 0.083% IN NEBU: 3.0000 mL | INHALATION_SOLUTION | Freq: Four times a day (QID) | RESPIRATORY_TRACT | Status: DC | PRN

## 2016-08-09 MED ORDER — FENTANYL CITRATE (PF) 100 MCG/2ML IJ SOLN
50.0000 ug | Freq: Once | INTRAMUSCULAR | Status: AC
  Administered 2016-08-09: 50 ug via INTRAVENOUS

## 2016-08-09 MED ORDER — CALCIUM CHLORIDE 10 % IV SOLN
INTRAVENOUS | Status: AC
  Filled 2016-08-09: qty 10

## 2016-08-09 MED ORDER — B COMPLEX PO TABS: 1.0000 | ORAL_TABLET | Freq: Every day | ORAL | Status: DC

## 2016-08-09 MED ORDER — LIDOCAINE HCL 4 % MT SOLN
OROMUCOSAL | Status: DC | PRN
  Administered 2016-08-09: 4 mL via TOPICAL

## 2016-08-09 MED ORDER — MIDAZOLAM HCL 5 MG/5ML IJ SOLN
INTRAMUSCULAR | Status: DC | PRN
  Administered 2016-08-09 (×2): 2 mg via INTRAVENOUS

## 2016-08-09 MED ORDER — VITAMIN C 500 MG PO TABS
500.0000 mg | ORAL_TABLET | Freq: Every day | ORAL | Status: DC
  Administered 2016-08-11 – 2016-08-12 (×2): 500 mg via ORAL
  Filled 2016-08-09 (×3): qty 1

## 2016-08-09 MED ORDER — SUCCINYLCHOLINE CHLORIDE 200 MG/10ML IV SOSY
PREFILLED_SYRINGE | INTRAVENOUS | Status: AC
  Filled 2016-08-09: qty 10

## 2016-08-09 MED ORDER — DULOXETINE HCL 60 MG PO CPEP
90.0000 mg | ORAL_CAPSULE | Freq: Every day | ORAL | Status: DC
  Administered 2016-08-10 – 2016-08-12 (×3): 90 mg via ORAL
  Filled 2016-08-09 (×4): qty 1

## 2016-08-09 MED ORDER — ONDANSETRON HCL 4 MG/2ML IJ SOLN
INTRAMUSCULAR | Status: DC | PRN
  Administered 2016-08-09: 4 mg via INTRAVENOUS

## 2016-08-09 MED ORDER — SODIUM CHLORIDE 0.9 % IR SOLN
Status: DC | PRN
  Administered 2016-08-09: 3000 mL

## 2016-08-09 MED ORDER — LACTATED RINGERS IV SOLN
INTRAVENOUS | Status: DC | PRN
  Administered 2016-08-09 (×3): via INTRAVENOUS

## 2016-08-09 MED ORDER — METOCLOPRAMIDE HCL 5 MG PO TABS
5.0000 mg | ORAL_TABLET | Freq: Three times a day (TID) | ORAL | Status: DC | PRN
Start: 2016-08-09 — End: 2016-08-12

## 2016-08-09 MED ORDER — MOMETASONE FURO-FORMOTEROL FUM 200-5 MCG/ACT IN AERO
2.0000 | INHALATION_SPRAY | Freq: Two times a day (BID) | RESPIRATORY_TRACT | Status: DC
  Filled 2016-08-09 (×3): qty 8.8

## 2016-08-09 MED ORDER — PROPOFOL 10 MG/ML IV BOLUS
INTRAVENOUS | Status: AC
  Filled 2016-08-09: qty 20

## 2016-08-09 MED ORDER — HYDROMORPHONE HCL 1 MG/ML IJ SOLN: 1.0000 mg | INTRAMUSCULAR | Status: DC | PRN

## 2016-08-09 MED ORDER — ONDANSETRON HCL 4 MG/2ML IJ SOLN: 4.0000 mg | Freq: Four times a day (QID) | INTRAMUSCULAR | Status: DC | PRN

## 2016-08-09 MED ORDER — OXYCODONE-ACETAMINOPHEN 7.5-325 MG PO TABS
1.0000 | ORAL_TABLET | ORAL | Status: DC | PRN
  Administered 2016-08-09 – 2016-08-11 (×7): 2 via ORAL
  Administered 2016-08-11: 1 via ORAL
  Administered 2016-08-11: 2 via ORAL
  Filled 2016-08-09 (×10): qty 2
  Filled 2016-08-09: qty 1

## 2016-08-09 MED ORDER — BISACODYL 10 MG RE SUPP: 10.0000 mg | Freq: Every day | RECTAL | Status: DC | PRN

## 2016-08-09 MED ORDER — EPHEDRINE 5 MG/ML INJ
INTRAVENOUS | Status: AC
  Filled 2016-08-09: qty 20

## 2016-08-09 SURGICAL SUPPLY — 64 items
BAG DECANTER FOR FLEXI CONT (MISCELLANEOUS) ×2 IMPLANT
BNDG COHESIVE 4X5 TAN STRL (GAUZE/BANDAGES/DRESSINGS) ×2 IMPLANT
BRUSH FEMORAL CANAL (MISCELLANEOUS) ×2 IMPLANT
CEMENT HV SMART SET (Cement) ×3 IMPLANT
CONT SPEC 4OZ CLIKSEAL STRL BL (MISCELLANEOUS) ×2 IMPLANT
COVER SURGICAL LIGHT HANDLE (MISCELLANEOUS) ×3 IMPLANT
DRAPE ORTHO SPLIT 87X125 STRL (DRAPES) ×4 IMPLANT
DRAPE U-SHAPE 47X51 STRL (DRAPES) ×3 IMPLANT
DRSG ADAPTIC 3X8 NADH LF (GAUZE/BANDAGES/DRESSINGS) ×3 IMPLANT
DRSG PAD ABDOMINAL 8X10 ST (GAUZE/BANDAGES/DRESSINGS) ×4 IMPLANT
DURAPREP 26ML APPLICATOR (WOUND CARE) ×3 IMPLANT
ELECT BLADE 4.0 EZ CLEAN MEGAD (MISCELLANEOUS) ×3
ELECT NDL TIP 2.8 STRL (NEEDLE) IMPLANT
ELECT NEEDLE TIP 2.8 STRL (NEEDLE) ×3 IMPLANT
ELECT REM PT RETURN 9FT ADLT (ELECTROSURGICAL)
ELECTRODE BLDE 4.0 EZ CLN MEGD (MISCELLANEOUS) IMPLANT
ELECTRODE REM PT RTRN 9FT ADLT (ELECTROSURGICAL) IMPLANT
EVACUATOR 1/8 PVC DRAIN (DRAIN) ×3 IMPLANT
FLUID NSS /IRRIG 3000 ML XXX (IV SOLUTION) ×4 IMPLANT
GAUZE SPONGE 4X4 12PLY STRL (GAUZE/BANDAGES/DRESSINGS) ×3 IMPLANT
GLOVE BIOGEL PI ORTHO PRO 7.5 (GLOVE) ×2
GLOVE BIOGEL PI ORTHO PRO SZ8 (GLOVE) ×2
GLOVE ORTHO TXT STRL SZ7.5 (GLOVE) ×3 IMPLANT
GLOVE PI ORTHO PRO STRL 7.5 (GLOVE) ×1 IMPLANT
GLOVE PI ORTHO PRO STRL SZ8 (GLOVE) ×1 IMPLANT
GLOVE SURG ORTHO 8.5 STRL (GLOVE) ×3 IMPLANT
GOWN STRL REUS W/ TWL LRG LVL3 (GOWN DISPOSABLE) ×1 IMPLANT
GOWN STRL REUS W/ TWL XL LVL3 (GOWN DISPOSABLE) ×4 IMPLANT
GOWN STRL REUS W/TWL LRG LVL3 (GOWN DISPOSABLE) ×3
GOWN STRL REUS W/TWL XL LVL3 (GOWN DISPOSABLE) ×12
HANDPIECE INTERPULSE COAX TIP (DISPOSABLE) ×3
KIT BASIN OR (CUSTOM PROCEDURE TRAY) ×3 IMPLANT
KIT ROOM TURNOVER OR (KITS) ×3 IMPLANT
KIT SHOULDER SPACER 7MM STEM (Shoulder) ×2 IMPLANT
MANIFOLD NEPTUNE II (INSTRUMENTS) ×3 IMPLANT
NS IRRIG 1000ML POUR BTL (IV SOLUTION) ×3 IMPLANT
PACK SHOULDER (CUSTOM PROCEDURE TRAY) ×3 IMPLANT
PAD ARMBOARD 7.5X6 YLW CONV (MISCELLANEOUS) ×6 IMPLANT
SET HNDPC FAN SPRY TIP SCT (DISPOSABLE) IMPLANT
SLING ARM FOAM STRAP LRG (SOFTGOODS) ×2 IMPLANT
SPONGE LAP 18X18 X RAY DECT (DISPOSABLE) ×3 IMPLANT
SPONGE LAP 4X18 X RAY DECT (DISPOSABLE) ×2 IMPLANT
SUCTION FRAZIER TIP 10 FR DISP (SUCTIONS) ×2 IMPLANT
SUT FIBERWIRE #2 38 T-5 BLUE (SUTURE)
SUT MNCRL AB 3-0 PS2 18 (SUTURE) ×3 IMPLANT
SUT PDS AB 1 CT  36 (SUTURE) ×4
SUT PDS AB 1 CT 36 (SUTURE) IMPLANT
SUT VIC AB 0 CT1 27 (SUTURE) ×6
SUT VIC AB 0 CT1 27XBRD ANBCTR (SUTURE) ×1 IMPLANT
SUT VIC AB 2-0 CT1 27 (SUTURE) ×3
SUT VIC AB 2-0 CT1 TAPERPNT 27 (SUTURE) ×1 IMPLANT
SUTURE FIBERWR #2 38 T-5 BLUE (SUTURE) IMPLANT
SWAB COLLECTION DEVICE MRSA (MISCELLANEOUS) ×5 IMPLANT
SWAB CULTURE ESWAB REG 1ML (MISCELLANEOUS) IMPLANT
TAPE CLOTH SURG 6X10 WHT LF (GAUZE/BANDAGES/DRESSINGS) ×2 IMPLANT
TOWEL OR 17X24 6PK STRL BLUE (TOWEL DISPOSABLE) ×3 IMPLANT
TOWEL OR 17X26 10 PK STRL BLUE (TOWEL DISPOSABLE) ×3 IMPLANT
TRAY CATH 16FR W/PLASTIC CATH (SET/KITS/TRAYS/PACK) ×3 IMPLANT
TUBE ANAEROBIC SPECIMEN COL (MISCELLANEOUS) ×2 IMPLANT
TUBE CONNECTING 12'X1/4 (SUCTIONS) ×1
TUBE CONNECTING 12X1/4 (SUCTIONS) ×2 IMPLANT
UNDERPAD 30X30 (UNDERPADS AND DIAPERS) ×3 IMPLANT
WATER STERILE IRR 1000ML POUR (IV SOLUTION) ×3 IMPLANT
YANKAUER SUCT BULB TIP NO VENT (SUCTIONS) ×5 IMPLANT

## 2016-08-09 NOTE — Anesthesia Preprocedure Evaluation (Signed)
Anesthesia Evaluation  Patient identified by MRN, date of birth, ID band Patient awake    Reviewed: Allergy & Precautions, H&P , NPO status , Patient's Chart, lab work & pertinent test results  Airway Mallampati: III  TM Distance: >3 FB Neck ROM: Full    Dental no notable dental hx. (+) Teeth Intact, Dental Advisory Given   Pulmonary asthma , sleep apnea and Continuous Positive Airway Pressure Ventilation , Current Smoker,    Pulmonary exam normal breath sounds clear to auscultation       Cardiovascular hypertension, Pt. on medications  Rhythm:Regular Rate:Normal     Neuro/Psych Depression negative neurological ROS     GI/Hepatic Neg liver ROS, GERD  Medicated and Controlled,  Endo/Other  negative endocrine ROS  Renal/GU negative Renal ROS  negative genitourinary   Musculoskeletal  (+) Arthritis , Osteoarthritis,  Fibromyalgia -  Abdominal   Peds  Hematology negative hematology ROS (+)   Anesthesia Other Findings   Reproductive/Obstetrics negative OB ROS                             Lab Results  Component Value Date   WBC 10.5 08/05/2016   HGB 15.3 08/05/2016   HCT 43.8 08/05/2016   MCV 90.5 08/05/2016   PLT 321 08/05/2016   Lab Results  Component Value Date   CREATININE 0.93 08/05/2016   BUN 5 (L) 08/05/2016   NA 134 (L) 08/05/2016   K 3.8 08/05/2016   CL 99 (L) 08/05/2016   CO2 25 08/05/2016    Anesthesia Physical  Anesthesia Plan  ASA: III  Anesthesia Plan: General   Post-op Pain Management: GA combined w/ Regional for post-op pain   Induction: Intravenous  Airway Management Planned: Oral ETT  Additional Equipment:   Intra-op Plan:   Post-operative Plan: Extubation in OR  Informed Consent: I have reviewed the patients History and Physical, chart, labs and discussed the procedure including the risks, benefits and alternatives for the proposed anesthesia with  the patient or authorized representative who has indicated his/her understanding and acceptance.   Dental advisory given  Plan Discussed with: CRNA  Anesthesia Plan Comments: (Dr. Veverly Fells does not feel the patient will need an ISB.)        Anesthesia Quick Evaluation

## 2016-08-09 NOTE — Interval H&P Note (Signed)
History and Physical Interval Note:  08/09/2016 2:48 PM  Frederick Guzman  has presented today for surgery, with the diagnosis of Left shoulder infection  The various methods of treatment have been discussed with the patient and family. After consideration of risks, benefits and other options for treatment, the patient has consented to  Procedure(s): LEFT SHOULDER HARDWARE REMOVAL, IRRIGATION AND DEBRIDEMENT, PLACEMENT OF ANTIBIOTIC SPACER (Left) as a surgical intervention .  The patient's history has been reviewed, patient examined, no change in status, stable for surgery.  I have reviewed the patient's chart and labs.  Questions were answered to the patient's satisfaction.     Brack Shaddock,STEVEN R

## 2016-08-09 NOTE — Brief Op Note (Signed)
08/09/2016  6:41 PM  PATIENT:  Frederick Guzman  66 y.o. male  PRE-OPERATIVE DIAGNOSIS:  Left shoulder infection, prosthetic joint infection  POST-OPERATIVE DIAGNOSIS:  Left shoulder infection, prosthetic joint infection  PROCEDURE:  Procedure(s): LEFT SHOULDER HARDWARE REMOVAL, IRRIGATION AND DEBRIDEMENT, PLACEMENT OF ANTIBIOTIC SPACER (Left)  SURGEON:  Surgeon(s) and Role:    Netta Cedars, MD - Primary  PHYSICIAN ASSISTANT:   ASSISTANTS: Ventura Bruns, PA-C    ANESTHESIA:   regional and general  EBL:  Total I/O In: 2750 [I.V.:2000; IV Piggyback:750] Out: 950 [Urine:250; Blood:700]  BLOOD ADMINISTERED:none  DRAINS: none   LOCAL MEDICATIONS USED:  MARCAINE     SPECIMEN:  Source of Specimen:  left shoulder fluid and tissue  DISPOSITION OF SPECIMEN:  micro  COUNTS:  YES  TOURNIQUET:  * No tourniquets in log *  DICTATION: .Other Dictation: Dictation Number [id number]  PLAN OF CARE: Admit to inpatient   PATIENT DISPOSITION:  PACU - hemodynamically stable.   Delay start of Pharmacological VTE agent (>24hrs) due to surgical blood loss or risk of bleeding: not applicable

## 2016-08-09 NOTE — Anesthesia Procedure Notes (Signed)
Procedure Name: Intubation Date/Time: 08/09/2016 3:06 PM Performed by: Jacquiline Doe A Pre-anesthesia Checklist: Patient identified, Emergency Drugs available, Suction available and Patient being monitored Patient Re-evaluated:Patient Re-evaluated prior to inductionOxygen Delivery Method: Circle System Utilized and Circle system utilized Preoxygenation: Pre-oxygenation with 100% oxygen Intubation Type: IV induction and Cricoid Pressure applied Ventilation: Mask ventilation without difficulty and Oral airway inserted - appropriate to patient size Laryngoscope Size: Mac and 4 Grade View: Grade I Tube type: Oral Tube size: 7.5 mm Number of attempts: 1 Airway Equipment and Method: Stylet,  Oral airway and LTA kit utilized Placement Confirmation: ETT inserted through vocal cords under direct vision,  positive ETCO2 and breath sounds checked- equal and bilateral Secured at: 23 cm Tube secured with: Tape Dental Injury: Teeth and Oropharynx as per pre-operative assessment

## 2016-08-09 NOTE — OR Nursing (Signed)
1830: in&out cath=250cc concentrated cyu per protocol

## 2016-08-09 NOTE — Progress Notes (Signed)
Pharmacy Consult for "assess medication for high dose warnings"  Patient is taking high-dose Cymbalta at home (90 mg daily total).  However, depending on indication, max daily dose is 120 mg although studies did not support additional benefit from higher dosing.  Abrupt tapering or discontinuation of Cymbalta can trigger withdrawal symptoms.  Recommend continuation of home dose while inpatient.  Could consider dose reduction with oversight from outpatient provider.  Otherwise, doses of currently ordered medications seem reasonable given current patient age, weight and renal function.  Please call with any additional questions.  Thanks!  Uvaldo Rising, BCPS  Clinical Pharmacist Pager [phone]  08/09/2016 8:55 PM

## 2016-08-09 NOTE — Anesthesia Procedure Notes (Signed)
Anesthesia Regional Block: Interscalene brachial plexus block   Pre-Anesthetic Checklist: ,, timeout performed, Correct Patient, Correct Site, Correct Laterality, Correct Procedure, Correct Position, site marked, Risks and benefits discussed,  Surgical consent,  Pre-op evaluation,  At surgeon's request and post-op pain management  Laterality: Left  Prep: chloraprep       Needles:  Injection technique: Single-shot  Needle Type: Echogenic Needle     Needle Length: 9cm  Needle Gauge: 21     Additional Needles:   Procedures: ultrasound guided, nerve stimulator,,,,,,   Nerve Stimulator or Paresthesia:  Response: deltoid and triceps, 0.5 mA,   Additional Responses:   Narrative:  Start time: 08/09/2016 1:43 PM End time: 08/09/2016 1:50 PM Injection made incrementally with aspirations every 5 mL.  Performed by: Personally  Anesthesiologist: Suzette Battiest

## 2016-08-09 NOTE — Transfer of Care (Signed)
Immediate Anesthesia Transfer of Care Note  Patient: Frederick Guzman  Procedure(s) Performed: Procedure(s): LEFT SHOULDER HARDWARE REMOVAL, IRRIGATION AND DEBRIDEMENT, PLACEMENT OF ANTIBIOTIC SPACER (Left)  Patient Location: PACU  Anesthesia Type:General  Level of Consciousness: awake, patient cooperative and responds to stimulation  Airway & Oxygen Therapy: Patient Spontanous Breathing and Patient connected to nasal cannula oxygen  Post-op Assessment: Report given to RN and Post -op Vital signs reviewed and stable  Post vital signs: Reviewed and stable  Last Vitals:  Vitals:   08/09/16 1350 08/09/16 1842  BP: 107/68 (!) 104/55  Pulse: 89   Resp: 20 11  Temp:  36.3 C    Last Pain:  Vitals:   08/09/16 1842  TempSrc:   PainSc: (P) 0-No pain      Patients Stated Pain Goal: 2 (93/55/21 7471)  Complications: No apparent anesthesia complications

## 2016-08-10 ENCOUNTER — Encounter (HOSPITAL_COMMUNITY): Payer: Self-pay | Admitting: Orthopedic Surgery

## 2016-08-10 LAB — BASIC METABOLIC PANEL
Anion gap: 6 (ref 5–15)
BUN: 6 mg/dL (ref 6–20)
CHLORIDE: 102 mmol/L (ref 101–111)
CO2: 28 mmol/L (ref 22–32)
Calcium: 7.9 mg/dL — ABNORMAL LOW (ref 8.9–10.3)
Creatinine, Ser: 0.85 mg/dL (ref 0.61–1.24)
GFR calc Af Amer: >60 mL/min (ref >60)
GFR calc non Af Amer: >60 mL/min (ref >60)
Glucose, Bld: 150 mg/dL — ABNORMAL HIGH (ref 65–99)
POTASSIUM: 3.8 mmol/L (ref 3.5–5.1)
Sodium: 136 mmol/L (ref 135–145)

## 2016-08-10 LAB — POCT I-STAT 4, (NA,K, GLUC, HGB,HCT)
GLUCOSE: 115 mg/dL — AB (ref 65–99)
HCT: 29 % — ABNORMAL LOW (ref 39.0–52.0)
Hemoglobin: 9.9 g/dL — ABNORMAL LOW (ref 13.0–17.0)
Potassium: 4.2 mmol/L (ref 3.5–5.1)
Sodium: 140 mmol/L (ref 135–145)

## 2016-08-10 LAB — HEMOGLOBIN AND HEMATOCRIT, BLOOD
HEMATOCRIT: 30.6 % — AB (ref 39.0–52.0)
Hemoglobin: 10.1 g/dL — ABNORMAL LOW (ref 13.0–17.0)

## 2016-08-10 LAB — MRSA PCR SCREENING: MRSA by PCR: NEGATIVE

## 2016-08-10 MED ORDER — VANCOMYCIN HCL IN DEXTROSE 1-5 GM/200ML-% IV SOLN
1000.0000 mg | Freq: Three times a day (TID) | INTRAVENOUS | Status: DC
  Administered 2016-08-10 – 2016-08-11 (×4): 1000 mg via INTRAVENOUS
  Filled 2016-08-10 (×5): qty 200

## 2016-08-10 NOTE — Progress Notes (Signed)
Orthopedics Progress Note  Subjective: Some pain this morning but controlled with po meds  Objective:  Vitals:   08/10/16 0256 08/10/16 0300  BP: 111/71 111/71  Pulse: 84 84  Resp: 17 16  Temp: 98.5 F (36.9 C)     General: Awake and alert  Musculoskeletal: left shoulder dressing intact, wiggles fingers Neurovascularly intact  Lab Results  Component Value Date   WBC 11.3 (H) 08/09/2016   HGB 10.1 (L) 08/10/2016   HCT 30.6 (L) 08/10/2016   MCV 93.0 08/09/2016   PLT 271 08/09/2016       Component Value Date/Time   NA 136 08/10/2016 0432   K 3.8 08/10/2016 0432   CL 102 08/10/2016 0432   CO2 28 08/10/2016 0432   GLUCOSE 150 (H) 08/10/2016 0432   BUN 6 08/10/2016 0432   CREATININE 0.85 08/10/2016 0432   CALCIUM 7.9 (L) 08/10/2016 0432   GFRNONAA >60 08/10/2016 0432   GFRAA >60 08/10/2016 0432    Lab Results  Component Value Date   INR 1.19 07/13/2015    Assessment/Plan: POD #1 s/p Procedure(s): LEFT SHOULDER HARDWARE REMOVAL, IRRIGATION AND DEBRIDEMENT, PLACEMENT OF ANTIBIOTIC SPACER Patient with suspected recurrent infection left shoulder replacement s/p implant removal and placement of antibiotic spacer Will request Infectious Disease consult - thank you! Patient stable   Doran Heater. Veverly Fells, MD 08/10/2016 7:40 AM

## 2016-08-10 NOTE — Consult Note (Signed)
[facility] for Infectious [facility]  Total days of antibiotics 1        Day 1 Ancef / vancomycin          Admission Date: 08/09/2016 Consult Date: 08/10/2016      Reason for Consult: Infection of prosthetic shoulder     Referring Physician: Dr. Veverly Fells   Active Problems:   Status post shoulder surgery   Prosthetic shoulder infection, subsequent encounter   HPI: Frederick Guzman is a 66 y.o. male that was admitted to the hospital with an infected left prosthetic shoulder joint. He is s/p hardware removal, I&D and placement of antibiotic spacer on 5/21 Veverly Fells). Reviewed the operative note and found there was mucinous material within a capsule in the posterior aspect of the shoulder. This space was scraped and irrigated extensively. The surgery was complicated by bleeding and hypotension with a 5 g drop in his Hgb. All hardware was removed during procedure. He is currently on Cefazolin.   01-30-2016 >> L shoulder arthroplasty 03-2016 >> left shoulder I&D with poly exchange  08-09-2016 >> left shoulder I&D with hardware removal and antibiotic spacer insertion   He is feeling better now and is without complaint today aside from some expected soreness. Inquiring about course of action from this point. Tells me that he will look at re-implant of hardware after a few months as Dr. Veverly Fells has discussed with him.   Past Medical History:  Diagnosis Date  . Arthritis    OA  . Asthma   . Cancer ([place]) 08/2014   melanoma on R shoulder & back - [facility]   . COPD (chronic obstructive pulmonary disease) ([place])   . Depression    pt. denies- 01/2016  . Diverticulitis   . Dyspnea    with exertion  . Fibromyalgia   . GERD (gastroesophageal reflux disease)   . Hyperlipemia   . Hypertension    "never was high"  . Pneumonia 06/2015  . Sleep apnea    last study - Nov. 2017, waiting for a new machine, using the old CPAP q night after cleaning the device after he was documented to have pneumonia  from his CPAP machine.     Allergies:  Allergies  Allergen Reactions  . Methotrexate Derivatives Nausea And Vomiting and Other (See Comments)    Mood change  . Ultram [Tramadol Hcl] Nausea And Vomiting    Current antibiotics: Anti-infectives    Start     Dose/Rate Route Frequency Ordered Stop   08/09/16 2200  ceFAZolin (ANCEF) IVPB 2g/100 mL premix     2 g 200 mL/hr over 30 Minutes Intravenous Every 8 hours 08/09/16 2037 09/08/16 2159   08/09/16 1749  vancomycin (VANCOCIN) powder  Status:  Discontinued       As needed 08/09/16 1749 08/09/16 1842        MEDICATIONS: . aspirin EC  81 mg Oral QHS  . atorvastatin  20 mg Oral QODAY  . docusate sodium  100 mg Oral BID  . DULoxetine  90 mg Oral Daily  . finasteride  5 mg Oral Daily  . lisinopril  5 mg Oral QHS  . loratadine  10 mg Oral Daily  . mometasone-formoterol  2 puff Inhalation BID  . multivitamin with minerals  1 tablet Oral QHS  . omega-3 acid ethyl esters  1 g Oral Daily  . pantoprazole  80 mg Oral Q1200  . sildenafil  20 mg Oral TID  . tamsulosin  0.4 mg Oral  BID  . vitamin C  500 mg Oral Daily    Social History  Substance Use Topics  . Smoking status: Current Every Day Smoker    Packs/day: 1.25    Years: 42.00  . Smokeless tobacco: Never Used  . Alcohol use 4.2 oz/week    3 Shots of liquor, 4 Cans of beer per week     Comment: beer or mixed drink     History reviewed. No pertinent family history.  Review of Systems - History obtained from chart review and the patient General ROS: no f/c, weight loss or malaise Respiratory ROS: wheezing normally with underlying lung disease. Nothing out of the norm for him  Cardiovascular ROS: no chest pain or dyspnea on exertion Gastrointestinal ROS: no abdominal pain, change in bowel habits, or black or bloody stools Genito-Urinary ROS: no dysuria or hematuria. Having trouble voiding  Musculoskeletal ROS: positive for left shoulder pain    OBJECTIVE: Temp:  [97.4  F (36.3 C)-99.1 F (37.3 C)] 98.6 F (37 C) (05/22 1229) Pulse Rate:  [77-94] 87 (05/22 1229) Resp:  [11-32] 15 (05/22 1229) BP: (95-134)/(55-74) 101/74 (05/22 1229) SpO2:  [93 %-100 %] 93 % (05/22 0825) Weight:  [211 lb 10.3 oz (96 kg)] 211 lb 10.3 oz (96 kg) (05/21 2015)   General appearance: alert, cooperative, no distress and well developed and well nourished. Appears comfortable sitting in bed Resp: clear to auscultation bilaterally GI: soft, non-tender; bowel sounds normal; no masses,  no organomegaly Extremities: extremities normal, atraumatic, no cyanosis or edema and left arm in sling Pulses: 2+ and symmetric  LABS: Results for orders placed or performed during the hospital encounter of 08/09/16 (from the past 48 hour(s))  Aerobic/Anaerobic Culture (surgical/deep wound)     Status: None (Preliminary result)   Collection Time: 08/09/16  3:40 PM  Result Value Ref Range   Specimen Description WOUND LEFT SHOULDER    Special Requests NONE    Gram Stain      ABUNDANT WBC PRESENT, PREDOMINANTLY PMN NO ORGANISMS SEEN    Culture NO GROWTH < 24 HOURS    Report Status PENDING   Aerobic/Anaerobic Culture (surgical/deep wound)     Status: None (Preliminary result)   Collection Time: 08/09/16  3:49 PM  Result Value Ref Range   Specimen Description TISSUE LEFT SHOULDER    Special Requests NONE    Gram Stain      FEW WBC PRESENT,BOTH PMN AND MONONUCLEAR NO ORGANISMS SEEN    Culture NO GROWTH < 24 HOURS    Report Status PENDING   I-STAT 4, (NA,K, GLUC, HGB,HCT)     Status: Abnormal   Collection Time: 08/09/16  5:45 PM  Result Value Ref Range   Sodium 140 135 - 145 mmol/L   Potassium 4.2 3.5 - 5.1 mmol/L   Glucose, Bld 115 (H) 65 - 99 mg/dL   HCT 29.0 (L) 39.0 - 52.0 %   Hemoglobin 9.9 (L) 13.0 - 17.0 g/dL  CBC     Status: Abnormal   Collection Time: 08/09/16  6:45 PM  Result Value Ref Range   WBC 11.3 (H) 4.0 - 10.5 K/uL   RBC 3.70 (L) 4.22 - 5.81 MIL/uL   Hemoglobin  11.3 (L) 13.0 - 17.0 g/dL   HCT 34.4 (L) 39.0 - 52.0 %   MCV 93.0 78.0 - 100.0 fL   MCH 30.5 26.0 - 34.0 pg   MCHC 32.8 30.0 - 36.0 g/dL   RDW 13.0 11.5 - 15.5 %  Platelets 271 150 - 400 K/uL  Type and screen [place]     Status: None   Collection Time: 08/09/16  8:15 PM  Result Value Ref Range   ABO/RH(D) A NEG    Antibody Screen NEG    Sample Expiration 08/12/2016   ABO/Rh     Status: None   Collection Time: 08/09/16  8:15 PM  Result Value Ref Range   ABO/RH(D) A NEG   Hemoglobin and hematocrit, blood     Status: Abnormal   Collection Time: 08/10/16  4:32 AM  Result Value Ref Range   Hemoglobin 10.1 (L) 13.0 - 17.0 g/dL   HCT 30.6 (L) 39.0 - 01.6 %  Basic metabolic panel     Status: Abnormal   Collection Time: 08/10/16  4:32 AM  Result Value Ref Range   Sodium 136 135 - 145 mmol/L   Potassium 3.8 3.5 - 5.1 mmol/L   Chloride 102 101 - 111 mmol/L   CO2 28 22 - 32 mmol/L   Glucose, Bld 150 (H) 65 - 99 mg/dL   BUN 6 6 - 20 mg/dL   Creatinine, Ser 0.85 0.61 - 1.24 mg/dL   Calcium 7.9 (L) 8.9 - 10.3 mg/dL   GFR calc non Af Amer >60 >60 mL/min   GFR calc Af Amer >60 >60 mL/min    Comment: (NOTE) The eGFR has been calculated using the CKD EPI equation. This calculation has not been validated in all clinical situations. eGFR's persistently <60 mL/min signify possible Chronic Kidney Disease.    Anion gap 6 5 - 15  MRSA PCR Screening     Status: None   Collection Time: 08/10/16  5:49 AM  Result Value Ref Range   MRSA by PCR NEGATIVE NEGATIVE    Comment:        The GeneXpert MRSA Assay (FDA approved for NASAL specimens only), is one component of a comprehensive MRSA colonization surveillance program. It is not intended to diagnose MRSA infection nor to guide or monitor treatment for MRSA infections.     MICRO:  IMAGING: Dg Shoulder Left Port  Result Date: 08/09/2016 CLINICAL DATA:  66 year old male with hardware removal with placement of  antibiotic spacer. Subsequent encounter. EXAM: LEFT SHOULDER - 1 VIEW COMPARISON:  07/29/2016 CT. FINDINGS: Left shoulder prosthesis removed and antibiotic spacer placed. No complication detected. Acromioclavicular joint degenerative changes. Visualized lungs clear. IMPRESSION: Left shoulder prosthesis removed and antibiotic spacer placed. No complication detected. Electronically Signed   By: Genia Del M.D.   On: 08/09/2016 20:45    HISTORICAL MICRO/IMAGING  Assessment/Plan:    1. Infected Left Shoulder Prosthesis -s/p hardware removal and abx spacer insertion 5/21 -WCx >> abundant WBC, no organisms preliminarily.  -Add vancomycin to cover for coag neg staph (of which he grew on previous culture along with P. acnes) -Continue the ancef  -OK to place PICC line (case worker consult placed for home health needs) -6 week duration for treatment with removal of hardware. Will follow cultures  2. Anemia 2/2 blood loss from surgery -per primary team  Janene Madeira, MSN, NP-C [facility] [place] Cell: [phone] Pager: [phone]  08/10/2016 4:49 PM

## 2016-08-10 NOTE — Progress Notes (Signed)
Pharmacy Antibiotic Note  Frederick Guzman is a 66 y.o. male admitted on 08/09/2016 with prosthetic joint infection, s/p removal of hardware.  Pharmacy has been consulted for vancomycin dosing.  Patient already receiving cefazolin.  Plan: Vancomycin 1000 mg IV every 8 hours.  Goal trough 15-20 mcg/mL.  F/u renal function, cultures, and clinical course. Vancomycin trough at steady state as necessary.  Height: 6\' 1"  (185.4 cm) Weight: 211 lb 10.3 oz (96 kg) IBW/kg (Calculated) : 79.9  Temp (24hrs), Avg:98.4 F (36.9 C), Min:97.4 F (36.3 C), Max:99.1 F (37.3 C)   Recent Labs Lab 08/05/16 1448 08/09/16 1845 08/10/16 0432  WBC 10.5 11.3*  --   CREATININE 0.93  --  0.85    Estimated Creatinine Clearance: 102.9 mL/min (by C-G formula based on SCr of 0.85 mg/dL).    Allergies  Allergen Reactions  . Methotrexate Derivatives Nausea And Vomiting and Other (See Comments)    Mood change  . Ultram [Tramadol Hcl] Nausea And Vomiting    Antimicrobials this admission:  Cefazolin 5/21 >> planned thru 6/20 Vanc 5/22  >>   Dose adjustments this admission:    Microbiology results:   BCx:   UCx:   5/21 Wound Cx:   5/22 MRSA PCR: Neg   Thank you for allowing pharmacy to be a part of this patient's care.  Uvaldo Rising, BCPS  Clinical Pharmacist Pager [phone]  08/10/2016 4:41 PM

## 2016-08-10 NOTE — Anesthesia Postprocedure Evaluation (Signed)
Anesthesia Post Note  Patient: Frederick Guzman  Procedure(s) Performed: Procedure(s) (LRB): LEFT SHOULDER HARDWARE REMOVAL, IRRIGATION AND DEBRIDEMENT, PLACEMENT OF ANTIBIOTIC SPACER (Left)  Patient location during evaluation: PACU Anesthesia Type: General Level of consciousness: awake and alert Pain management: pain level controlled Vital Signs Assessment: post-procedure vital signs reviewed and stable Respiratory status: spontaneous breathing, nonlabored ventilation, respiratory function stable and patient connected to nasal cannula oxygen Cardiovascular status: blood pressure returned to baseline and stable Postop Assessment: no signs of nausea or vomiting Anesthetic complications: no       Last Vitals:  Vitals:   08/10/16 0300 08/10/16 0825  BP: 111/71 95/62  Pulse: 84 87  Resp: 16 18  Temp:  37.3 C    Last Pain:  Vitals:   08/10/16 0825  TempSrc: Oral  PainSc:                  [name]

## 2016-08-10 NOTE — Op Note (Signed)
Frederick Guzman, TEEGARDEN NO.:  [id number]  MEDICAL RECORD NO.:  [id number]  LOCATION:  [facility]                         FACILITY:  [facility] [place] [facility]  PHYSICIAN:  Doran Heater. Veverly Fells, M.D. DATE OF BIRTH:  25-Mar-1950  DATE OF PROCEDURE:  08/09/2016 DATE OF DISCHARGE:                              OPERATIVE REPORT   PREOPERATIVE DIAGNOSIS:  Left shoulder prosthetic joint infection.  POSTOPERATIVE DIAGNOSIS:  Left shoulder prosthetic joint infection.  PROCEDURE PERFORMED:  Left shoulder implant removal with placement of antibiotic spacer and irrigation and debridement.  ATTENDING SURGEON:  Doran Heater. Veverly Fells, MD.  ASSISTANT:  Abbott Pao. Dixon, PA-C, who scrubbed the entire procedure and necessary for satisfactory completion of surgery general.  ANESTHESIA:  General anesthesia was used plus interscalene block.  ESTIMATED BLOOD LOSS:  800 mL of blood.  FLUID REPLACED:  Albumin 1500 mL, 1000 mL crystalloid.  COUNT:  Instrument count was correct.  COMPLICATIONS:  There were no complications.  ANTIBIOTICS:  Perioperative antibiotics were given.  INDICATIONS:  The patient is a 66 year old male with a history of left shoulder reverse arthroplasty.  The patient developed postoperative infection requiring irrigation, debridement, and polyethylene exchange. The patient presents with evidence of suspected recurrent infection approximately 6 months after his index surgery.  Risks and benefits of surgical exploration, implant removal, and placement of antibiotic spacer were discussed in detail with the patient.  Informed consent obtained.  DESCRIPTION OF PROCEDURE:  After an adequate level of anesthesia was achieved, the patient was positioned in modified beach-chair position. Left shoulder correctly identified, sterilely prepped and draped in usual manner.  Time-out called.  We entered through the patient's prior anterior deltopectoral incision starting with a #10 blade  scalpel. Dissection down through subcutaneous tissues using Bovie.  Bovie was used to control hemostasis.  Deltopectoral interval identified. Conjoint tendon identified and freed up from the underlying pectoralis. We were careful to protect the axillary nerve during the surgery.  We were able to free up onto the deltoid muscle around the posterior aspect of the shoulder.  We entered a pseudo capsule and mucinous material and some cloudy fluid was retrieved.  We did culture that aerobic, anaerobic, and gram stain, both fluid and tissue.  We then removed the polyethylene.  We then able to extend the shoulder and then worked to remove the humeral component first.  We used flexible osteotomes around the proximal HA coated metaphyseal component.  We then unscrewed the metaphyseal component.  I was able to remove that and I then used flexible osteotomes.  Soft bone was encountered around portions of the stem.  We were able to slide the flexible osteotomes down around the stem portion.  We then put the metaphysis back on, screwed in place, and impacted everything out.  We were able to retrieve the humeral implant without much destruction of bone on the humeral side at all.  We next directed our attention towards the scapular side.  We removed the glenosphere with a screwdriver.  We then identified the baseplate.  All the bone on the glenoid side was very strong.  We removed 3 screws and the set screws from the baseplate.  We then used flexible osteotomes as we initially tried  to just not get out of there and it would not come out, it was well fixed.  We then used flexible osteotomes under the glenoid component and then that was able to come out without difficulty. We scraped and irrigated and removed all mucinous material anything that did not look normal.  The bone, I thought looked normal on the glenoid side and again, a little soft up on the humeral side.  We used 3 liters of normal saline  irrigation on the humeral side with a brush down the canal and then 3 liters of normal saline irrigation on the glenoid side. Everything that was not bleeding and did not look healthy was removed. We were careful again to protect the axillary nerve.  There was a large bone fragment that developed postoperatively in the soft tissues.  This was freed up with a Cobb elevator.  We were able to get around it 360 degrees and then used an osteotome to remove it off the scapular neck. Once that was out, there was a pretty big hole in the soft tissue there with some bleeding muscular tissue.  It was very difficult to control that with hemostasis.  We did get the canal nice and dry.  We then went and trialed with the [facility].  This was basically a PMMA impregnated with gentamicin that is a hemiarthroplasty type monoblock device.  We trialed with the larger 46 diameter head and 11 mm stem and that fit perfectly.  We then mixed DePuy high-viscosity cement with vancomycin 1 g on the back table.  We used that cement amount just underneath the head piece to control rotation of our implant.  When we placed the implant at the appropriate height, allowed the cement to harden, then reduced the shoulder.  We were pleased with our stability under the CA ligament which was preserved.  We then did a little bit more work on trying to control hemostasis down the axillary area at no time.  Although the patient was not paralyzed, we have any conduction of the nerve, but we were definitely down in that area with getting that big bone fragment out.  We went ahead at this point and elected to proceed with the deltopectoral closure with #1 PDS suture, which we did very thick muscular closure and then 2-0 Vicryl subcutaneous closure and staples for skin.  Sterile compressive bandage, shoulder sling.  Will be monitoring the hemoglobin postoperatively as the hemoglobin dropped from around 15 down to 10 during  surgery and will be checking cultures closely too.     Doran Heater. Veverly Fells, M.D.     SRN/MEDQ  D:  08/09/2016  T:  08/10/2016  Job:  [id number]

## 2016-08-10 NOTE — Progress Notes (Signed)
Pt arrived to the floor and resting comfortably in bed. Attempted to apply cardiac monitor but was asked by pt to get order discontinued since pt does not have extensive cardiac history. Called on-call number for Dr. Veverly Fells and received a call back from Dr. Stann Mainland with verbal confirmation to discontinue cardiac monitor order. Order placed and pt updated. Will continue to monitor

## 2016-08-10 NOTE — Care Management Note (Signed)
Case Management Note  Patient Details  Name: RAESHAUN SIMSON MRN: [id number] Date of Birth: 11-09-1950  Subjective/Objective:   From home with wife, POD 1 left shoulder hardware removal , irrigation and debridement, placement of antibiotic spacer.  Suspected recurrent infection, ID consulted.  Patient has medicare [employer] insurance  PCP listed : Julie Martinique              Action/Plan: NCM will follow for dc needs.  Expected Discharge Date:                  Expected Discharge Plan:     In-House Referral:     Discharge planning Services  CM Consult  Post Acute Care Choice:    Choice offered to:     DME Arranged:    DME Agency:     HH Arranged:    HH Agency:     Status of Service:  In process, will continue to follow  If discussed at Long Length of Stay Meetings, dates discussed:    Additional Comments:  Zenon Mayo, RN 08/10/2016, 12:39 PM

## 2016-08-11 LAB — CBC WITH DIFFERENTIAL/PLATELET
BASOS PCT: 0 %
Basophils Absolute: 0 10*3/uL (ref 0.0–0.1)
EOS ABS: 0.4 10*3/uL (ref 0.0–0.7)
Eosinophils Relative: 4 %
HCT: 29.4 % — ABNORMAL LOW (ref 39.0–52.0)
Hemoglobin: 10 g/dL — ABNORMAL LOW (ref 13.0–17.0)
Lymphocytes Relative: 19 %
Lymphs Abs: 1.7 10*3/uL (ref 0.7–4.0)
MCH: 30.8 pg (ref 26.0–34.0)
MCHC: 34 g/dL (ref 30.0–36.0)
MCV: 90.5 fL (ref 78.0–100.0)
MONO ABS: 0.5 10*3/uL (ref 0.1–1.0)
MONOS PCT: 6 %
NEUTROS ABS: 6 10*3/uL (ref 1.7–7.7)
Neutrophils Relative %: 71 %
Platelets: 230 10*3/uL (ref 150–400)
RBC: 3.25 MIL/uL — ABNORMAL LOW (ref 4.22–5.81)
RDW: 12.4 % (ref 11.5–15.5)
WBC: 8.6 10*3/uL (ref 4.0–10.5)

## 2016-08-11 LAB — C-REACTIVE PROTEIN: CRP: 18.5 mg/dL — ABNORMAL HIGH (ref <1.0)

## 2016-08-11 LAB — SEDIMENTATION RATE: SED RATE: 74 mm/h — AB (ref 0–16)

## 2016-08-11 MED ORDER — SODIUM CHLORIDE 0.9% FLUSH: 10.0000 mL | INTRAVENOUS | Status: DC | PRN

## 2016-08-11 NOTE — Progress Notes (Signed)
Orthopedics Progress Note  Subjective: Feeling better  Objective:  Vitals:   08/10/16 2149 08/11/16 0619  BP: 107/86 115/65  Pulse: 95 82  Resp:  16  Temp: 99.1 F (37.3 C) 98.3 F (36.8 C)    General: Awake and alert  Musculoskeletal: left shoulder dressing intact Neurovascularly intact  Lab Results  Component Value Date   WBC 11.3 (H) 08/09/2016   HGB 10.1 (L) 08/10/2016   HCT 30.6 (L) 08/10/2016   MCV 93.0 08/09/2016   PLT 271 08/09/2016       Component Value Date/Time   NA 136 08/10/2016 0432   K 3.8 08/10/2016 0432   CL 102 08/10/2016 0432   CO2 28 08/10/2016 0432   GLUCOSE 150 (H) 08/10/2016 0432   BUN 6 08/10/2016 0432   CREATININE 0.85 08/10/2016 0432   CALCIUM 7.9 (L) 08/10/2016 0432   GFRNONAA >60 08/10/2016 0432   GFRAA >60 08/10/2016 0432    Lab Results  Component Value Date   INR 1.19 07/13/2015    Assessment/Plan: POD #2 s/p Procedure(s): LEFT SHOULDER HARDWARE REMOVAL, IRRIGATION AND DEBRIDEMENT, PLACEMENT OF ANTIBIOTIC SPACER Appreciate ID consult - PICC line today Follow cultures for ID and sensitivity - antibiotic selection D/C planning  Remo Lipps R. Veverly Fells, MD 08/11/2016 7:38 AM

## 2016-08-11 NOTE — Care Management Note (Signed)
Case Management Note  Patient Details  Name: Frederick Guzman MRN: [id number] Date of Birth: [birth year]  Subjective/Objective:     67 yr old gentleman s/p I & D with hardware removal and placement of antibiotic spacer. Will go home on IV antibiotics.                Action/Plan: Case manager spoke with patient concerning discharge plan. Choice for Home Health agency for IV antibiotics and RN offered. Referral called to karen Nusbaumm, [facility] and to Carolynn Sayers RN IV Specialist with [facility]. Patient has no DME needs. PICC line has been inserted.   Expected Discharge Date:    08/12/16             Expected Discharge Plan:  [place]  In-House Referral:  NA  Discharge planning Services  CM Consult  Post Acute Care Choice:  Home Health Choice offered to:  Patient  DME Arranged:  IV pump/equipment DME Agency:  NA  HH Arranged:  RN, IV Antibiotics HH Agency:  [place]  Status of Service:  Completed, signed off  If discussed at [place] of Stay Meetings, dates discussed:    Additional Comments:  Ninfa Meeker, RN 08/11/2016, 1:18 PM

## 2016-08-11 NOTE — Progress Notes (Signed)
Peripherally Inserted Central Catheter/Midline Placement  The IV Nurse has discussed with the patient and/or persons authorized to consent for the patient, the purpose of this procedure and the potential benefits and risks involved with this procedure.  The benefits include less needle sticks, lab draws from the catheter, and the patient may be discharged home with the catheter. Risks include, but not limited to, infection, bleeding, blood clot (thrombus formation), and puncture of an artery; nerve damage and irregular heartbeat and possibility to perform a PICC exchange if needed/ordered by physician.  Alternatives to this procedure were also discussed.  Bard Power PICC patient education guide, fact sheet on infection prevention and patient information card has been provided to patient /or left at bedside.    PICC/Midline Placement Documentation  PICC Single Lumen 08/11/16 PICC Right Brachial 45 cm 3 cm (Active)  Indication for Insertion or Continuance of Line Home intravenous therapies (PICC only) 08/11/2016 12:00 PM  Exposed Catheter (cm) 3 cm 08/11/2016 12:00 PM  Dressing Change Due 08/18/16 08/11/2016 12:00 PM       Jule Economy Horton 08/11/2016, 12:59 PM

## 2016-08-11 NOTE — Progress Notes (Addendum)
[facility] for Infectious Disease    Date of Admission:  08/09/2016   Total days of antibiotics 3        Day 3 cefazolin        Day 2 vanco           ID: Frederick Guzman is a 65 y.o. male with hx of left shoulder PJI with p.acnes and CoNS previously on amox plus rifampin. He is pod #2 s/p I x D with HW removal and abtx spacer. Active Problems:   Status post shoulder surgery   Prosthetic shoulder infection, subsequent encounter    Subjective: Afebrile. constipation  Medications:  . aspirin EC  81 mg Oral QHS  . atorvastatin  20 mg Oral QODAY  . docusate sodium  100 mg Oral BID  . DULoxetine  90 mg Oral Daily  . finasteride  5 mg Oral Daily  . lisinopril  5 mg Oral QHS  . loratadine  10 mg Oral Daily  . mometasone-formoterol  2 puff Inhalation BID  . multivitamin with minerals  1 tablet Oral QHS  . omega-3 acid ethyl esters  1 g Oral Daily  . pantoprazole  80 mg Oral Q1200  . sildenafil  20 mg Oral TID  . tamsulosin  0.4 mg Oral BID  . vitamin C  500 mg Oral Daily    Objective: Vital signs in last 24 hours: Temp:  [98.3 F (36.8 C)-99.1 F (37.3 C)] 98.3 F (36.8 C) (05/23 7915) Pulse Rate:  [82-95] 82 (05/23 0619) Resp:  [16] 16 (05/23 0619) BP: (105-115)/(58-86) 115/65 (05/23 0619) SpO2:  [91 %-98 %] 98 % (05/23 0569) Physical Exam  Constitutional: He is oriented to person, place, and time. He appears well-developed and well-nourished. No distress.  HENT:  Mouth/Throat: Oropharynx is clear and moist. No oropharyngeal exudate.  Cardiovascular: Normal rate, regular rhythm and normal heart sounds. Exam reveals no gallop and no friction rub.  No murmur heard.  Pulmonary/Chest: Effort normal and breath sounds normal. No respiratory distress. He has no wheezes.  Abdominal: Soft. Bowel sounds are normal. He exhibits no distension. There is no tenderness.  Ext: left shoulder bandage from surgery Skin: Skin is warm and dry. No rash noted. No erythema.    Psychiatric: He has a normal mood and affect. His behavior is normal.     Lab Results  Recent Labs  08/09/16 1745 08/09/16 1845 08/10/16 0432 08/11/16 1424  WBC  --  11.3*  --  8.6  HGB 9.9* 11.3* 10.1* 10.0*  HCT 29.0* 34.4* 30.6* 29.4*  NA 140  --  136  --   K 4.2  --  3.8  --   CL  --   --  102  --   CO2  --   --  28  --   BUN  --   --  6  --   CREATININE  --   --  0.85  --    No results found for: Micheline Rough  Microbiology: 5/21 OR cx pending Studies/Results: Dg Shoulder Left Port  Result Date: 08/09/2016 CLINICAL DATA:  66 year old male with hardware removal with placement of antibiotic spacer. Subsequent encounter. EXAM: LEFT SHOULDER - 1 VIEW COMPARISON:  07/29/2016 CT. FINDINGS: Left shoulder prosthesis removed and antibiotic spacer placed. No complication detected. Acromioclavicular joint degenerative changes. Visualized lungs clear. IMPRESSION: Left shoulder prosthesis removed and antibiotic spacer placed. No complication detected. Electronically Signed   By: Genia Del M.D.   On:  08/09/2016 20:45     Assessment/Plan: 66yo M with left shoulder PJI s/p HW removal and abtx spacer currently on vancomycin plus cefazolin - plan to place picc line today - anticipate to discharge on 6 wk of IV abtx, definitely will have cefazolin 2gm IV Q 8hr. Still awaiting the culture results at 72hr to see if may need vancomycin - will check sed rate and crp  Vancomycin - therapeutic drug monitoring = will need trough to be checked  Will place opat order for outpatient IV abtx. We will follow up in the ID clinic  ------------------------------------------------------ Diagnosis: Prosthetic joint infection of left shoulder  Culture Result: old cx showed p.acnes plus coagulase negative staph species  Allergies  Allergen Reactions  . Methotrexate Derivatives Nausea And Vomiting and Other (See Comments)    Mood change  . Ultram [Tramadol Hcl] Nausea And Vomiting     Discharge antibiotics: Per pharmacy protocol  Cefazolin 2gm IV q8 hr  Aim for Vancomycin trough 15-20 (unless otherwise indicated) Duration: 6 wk End Date: July 4th  [facility] Care Per Protocol:  Labs weekly while on IV antibiotics: _x_ CBC with differential _x_ BMP __ CMP _x_ CRP _x_ ESR __ Vancomycin trough  __ Please pull PIC at completion of IV antibiotics _x_ Please leave PIC in place until doctor has seen patient or been notified  Fax weekly labs to [phone]  Clinic Follow Up Appt: In 4-6 wk  @ [facility] with dr. Merlinda Frederick, [facility] for Infectious Diseases Cell: [phone] Pager: [phone]  08/11/2016, 3:54 PM

## 2016-08-11 NOTE — Progress Notes (Signed)
PT refuses CPAP / BiPAP for 08/11/16.

## 2016-08-11 NOTE — Progress Notes (Signed)
Pharmacy, Frederick Guzman aware pt has Symbicort at bedside. Also, she is aware RT will send back [facility] for credit.

## 2016-08-12 DIAGNOSIS — Z452 Encounter for adjustment and management of vascular access device: Secondary | ICD-10-CM | POA: Diagnosis not present

## 2016-08-12 DIAGNOSIS — I10 Essential (primary) hypertension: Secondary | ICD-10-CM | POA: Diagnosis not present

## 2016-08-12 DIAGNOSIS — J45909 Unspecified asthma, uncomplicated: Secondary | ICD-10-CM | POA: Diagnosis not present

## 2016-08-12 DIAGNOSIS — J449 Chronic obstructive pulmonary disease, unspecified: Secondary | ICD-10-CM | POA: Diagnosis not present

## 2016-08-12 DIAGNOSIS — Z96612 Presence of left artificial shoulder joint: Secondary | ICD-10-CM | POA: Diagnosis not present

## 2016-08-12 DIAGNOSIS — T8459XA Infection and inflammatory reaction due to other internal joint prosthesis, initial encounter: Secondary | ICD-10-CM | POA: Diagnosis not present

## 2016-08-12 LAB — BASIC METABOLIC PANEL
ANION GAP: 6 (ref 5–15)
BUN: <5 mg/dL — ABNORMAL LOW (ref 6–20)
CALCIUM: 8.2 mg/dL — AB (ref 8.9–10.3)
CO2: 30 mmol/L (ref 22–32)
CREATININE: 0.8 mg/dL (ref 0.61–1.24)
Chloride: 101 mmol/L (ref 101–111)
GFR calc non Af Amer: >60 mL/min (ref >60)
Glucose, Bld: 142 mg/dL — ABNORMAL HIGH (ref 65–99)
Potassium: 3.4 mmol/L — ABNORMAL LOW (ref 3.5–5.1)
SODIUM: 137 mmol/L (ref 135–145)

## 2016-08-12 LAB — VANCOMYCIN, TROUGH: VANCOMYCIN TR: 18 ug/mL (ref 15–20)

## 2016-08-12 MED ORDER — CEFAZOLIN SODIUM-DEXTROSE 2-4 GM/100ML-% IV SOLN: 2.0000 g | Freq: Three times a day (TID) | INTRAVENOUS | 0 refills | Status: DC

## 2016-08-12 MED ORDER — OXYCODONE-ACETAMINOPHEN 7.5-325 MG PO TABS: 1.0000 | ORAL_TABLET | ORAL | 0 refills | Status: DC | PRN

## 2016-08-12 MED ORDER — VANCOMYCIN HCL 10 G IV SOLR: 1500.0000 mg | Freq: Three times a day (TID) | INTRAVENOUS | 0 refills | Status: DC

## 2016-08-12 MED ORDER — CEFAZOLIN IV (FOR PTA / DISCHARGE USE ONLY): 2.0000 g | Freq: Three times a day (TID) | INTRAVENOUS | 0 refills | Status: DC

## 2016-08-12 MED ORDER — HEPARIN SOD (PORK) LOCK FLUSH 100 UNIT/ML IV SOLN
250.0000 [IU] | INTRAVENOUS | Status: AC | PRN
  Administered 2016-08-12: 250 [IU]

## 2016-08-12 MED ORDER — SODIUM CHLORIDE 0.9 % IV SOLN
1500.0000 mg | Freq: Three times a day (TID) | INTRAVENOUS | Status: DC
  Administered 2016-08-12: 1500 mg via INTRAVENOUS
  Filled 2016-08-12 (×2): qty 1500

## 2016-08-12 NOTE — Progress Notes (Signed)
PHARMACY CONSULT NOTE FOR:  OUTPATIENT  PARENTERAL ANTIBIOTIC THERAPY (OPAT)  Indication: Prosthetic joint infection Regimen: Cefazolin 2g IV Q8h  End date: 09/22/16   Discussed with ID and will not continue vancomycin as cultures remain ngtd.  Thank you for allowing pharmacy to be a part of this patient's care.  Elenor Quinones, PharmD, BCPS Clinical Pharmacist Pager [phone] 08/12/2016 11:08 AM

## 2016-08-12 NOTE — Progress Notes (Signed)
Pharmacy Antibiotic Note  Frederick Guzman is a 66 y.o. male admitted on 08/09/2016 with prosthetic joint infection.  Pharmacy has been consulted for vancomycin dosing.  Vanc trough of 18 was drawn only 5.5hr after dose, true trough closer to 11, below goal.  Plan: Change vancomycin to 1500mg  IV every 8 hours for calculated trough ~18.  Goal trough 15-20 mcg/mL. Check another trough tomorrow.  Height: 6\' 1"  (185.4 cm) Weight: 211 lb 10.3 oz (96 kg) IBW/kg (Calculated) : 79.9  Temp (24hrs), Avg:98.4 F (36.9 C), Min:98.3 F (36.8 C), Max:98.4 F (36.9 C)   Recent Labs Lab 08/05/16 1448 08/09/16 1845 08/10/16 0432 08/11/16 1424 08/12/16 0351  WBC 10.5 11.3*  --  8.6  --   CREATININE 0.93  --  0.85  --  0.80  VANCOTROUGH  --   --   --   --  18    Estimated Creatinine Clearance: 109.4 mL/min (by C-G formula based on SCr of 0.8 mg/dL).    Allergies  Allergen Reactions  . Methotrexate Derivatives Nausea And Vomiting and Other (See Comments)    Mood change  . Ultram [Tramadol Hcl] Nausea And Vomiting    Antimicrobials this admission:  Cefazolin 5/21 >> planned thru 6/20 Vanc 5/22  >>   Dose adjustments this admission:  Vanc 1000mg  Q8 >> trough ~11 (calculated) >> change vanc 1500mg  Q8  Microbiology results:  5/21 Wound Cx: NGTD 5/22 MRSA PCR: Neg  Thank you for allowing pharmacy to be a part of this patient's care.  Wynona Neat, PharmD, BCPS  08/12/2016 4:55 AM

## 2016-08-12 NOTE — Progress Notes (Signed)
    [facility] for Infectious Disease    Date of Admission:  08/09/2016   Total days of antibiotics 4                                                                                     Day 4 cefazolin                                                                                     Day 3 vanco                                                                                         ID: Frederick Guzman is a 66 y.o. male with  hx of left shoulder PJI with p.acnes and CoNS previously on amox plus rifampin. He is pod #2 s/p I x D with HW removal and abtx spacer.  Active Problems:   Status post shoulder surgery   Prosthetic shoulder infection, subsequent encounter  Discharge Planning:  Verified with microbiology lab that there is no growth on plates today at the 72 hour mark. As per previous note will stop vancomycin and discharge on Cefazolin 2gm IV q8h only with FU with Dr. Baxter Flattery in [place] clinic.  Janene Madeira, MSN, NP-C [facility] for Infectious [place] Cell: [phone] Pager: [phone]  08/12/2016 10:31 AM   08/12/2016, 10:30 AM

## 2016-08-12 NOTE — Progress Notes (Signed)
Orthopedics Progress Note  Subjective: Patient feels better and is looking forward to going home  Objective:  Vitals:   08/11/16 2055 08/12/16 0501  BP: (!) 174/75 (!) 118/57  Pulse: 96 83  Resp: 18 18  Temp: 98.4 F (36.9 C) 98.1 F (36.7 C)    General: Awake and alert  Musculoskeletal: left shoulder dressing changed, incision benign Neurovascularly intact  Lab Results  Component Value Date   WBC 8.6 08/11/2016   HGB 10.0 (L) 08/11/2016   HCT 29.4 (L) 08/11/2016   MCV 90.5 08/11/2016   PLT 230 08/11/2016       Component Value Date/Time   NA 137 08/12/2016 0351   K 3.4 (L) 08/12/2016 0351   CL 101 08/12/2016 0351   CO2 30 08/12/2016 0351   GLUCOSE 142 (H) 08/12/2016 0351   BUN <5 (L) 08/12/2016 0351   CREATININE 0.80 08/12/2016 0351   CALCIUM 8.2 (L) 08/12/2016 0351   GFRNONAA >60 08/12/2016 0351   GFRAA >60 08/12/2016 0351    Lab Results  Component Value Date   INR 1.19 07/13/2015    Assessment/Plan: POD #3 s/p Procedure(s): LEFT SHOULDER HARDWARE REMOVAL, IRRIGATION AND DEBRIDEMENT, PLACEMENT OF ANTIBIOTIC SPACER D/C to home after all home health arrangements made Plan 6 weeks IV abx per ID - see Dr Crissie Figures note Follow up with ID and with me in one week for wound check and f/u on cultures  Remo Lipps R. Veverly Fells, MD 08/12/2016 7:04 AM

## 2016-08-12 NOTE — Progress Notes (Signed)
[place] will provide [facility] team for home IV ABX (Ancef) at home upon DC today. [facility] is set for first home dose at 3-4 PM today.  In hospital teaching with pt to support independence at home.   Pt is awaiting daughter to transport home.  Relayed to [place], Therapist, sports for pt today that he his ready for DC.   If patient discharges after hours, please call [phone].   Larry Sierras 08/12/2016, 12:38 PM

## 2016-08-12 NOTE — Discharge Instructions (Signed)
Ice to the shoulder as much as you can.  Rest the shoulder in the sling or on a pillow.  Change Bandage on Saturday to the gel bandage then leave on until follow up with Dr Veverly Fells  Can shower once gel bandage in place and sealed.  PICC care and home IV antibiotics per Dr Graylon Good - Ancef 2 grams IV q 8hours and Vancomycin 1500 mg q day for 6 weeks  Will need to ensure vancomycin peak and trough performed by home health nursing per pharmacist recommendations  Follow up with Dr Veverly Fells in one week in the office, call [phone] for appt

## 2016-08-12 NOTE — Progress Notes (Signed)
Patient to be discharged home. Discharge instructions and prescriptions reviewed with patient, patient stated understanding. Patient is going home with PICC line, this has been capped off and heparin locked. Patient has daughter at bedside for transportation

## 2016-08-12 NOTE — Discharge Summary (Signed)
Physician Discharge Summary   Patient ID: Frederick Guzman MRN: [id number] DOB/AGE: October 25, 1950 66 y.o.  Admit date: 08/09/2016 Discharge date: 08/12/2016  Admission Diagnoses:  Active Problems:   Status post shoulder surgery   Prosthetic shoulder infection, subsequent encounter   Discharge Diagnoses:  Same   Surgeries: Procedure(s): LEFT SHOULDER HARDWARE REMOVAL, IRRIGATION AND DEBRIDEMENT, PLACEMENT OF ANTIBIOTIC SPACER on 08/09/2016   Consultants: pharmacy, infectious diesase  Discharged Condition: Stable  Hospital Course: Frederick Guzman is an 66 y.o. male who was admitted 08/09/2016 with a chief complaint of left shoulder pain, and found to have a diagnosis of left prosthetic joint infection shoulder.  They were brought to the operating room on 08/09/2016 and underwent the above named procedures.    The patient had an uncomplicated hospital course and was stable for discharge.  Recent vital signs:  Vitals:   08/11/16 2055 08/12/16 0501  BP: (!) 174/75 (!) 118/57  Pulse: 96 83  Resp: 18 18  Temp: 98.4 F (36.9 C) 98.1 F (36.7 C)    Recent laboratory studies:  Results for orders placed or performed during the hospital encounter of 08/09/16  Aerobic/Anaerobic Culture (surgical/deep wound)  Result Value Ref Range   Specimen Description WOUND LEFT SHOULDER    Special Requests NONE    Gram Stain      ABUNDANT WBC PRESENT, PREDOMINANTLY PMN NO ORGANISMS SEEN    Culture NO GROWTH 2 DAYS    Report Status PENDING   Aerobic/Anaerobic Culture (surgical/deep wound)  Result Value Ref Range   Specimen Description TISSUE LEFT SHOULDER    Special Requests NONE    Gram Stain      FEW WBC PRESENT,BOTH PMN AND MONONUCLEAR NO ORGANISMS SEEN    Culture      NO GROWTH 2 DAYS NO ANAEROBES ISOLATED; CULTURE IN PROGRESS FOR 5 DAYS   Report Status PENDING   MRSA PCR Screening  Result Value Ref Range   MRSA by PCR NEGATIVE NEGATIVE  CBC  Result Value Ref Range   WBC  11.3 (H) 4.0 - 10.5 K/uL   RBC 3.70 (L) 4.22 - 5.81 MIL/uL   Hemoglobin 11.3 (L) 13.0 - 17.0 g/dL   HCT 34.4 (L) 39.0 - 52.0 %   MCV 93.0 78.0 - 100.0 fL   MCH 30.5 26.0 - 34.0 pg   MCHC 32.8 30.0 - 36.0 g/dL   RDW 13.0 11.5 - 15.5 %   Platelets 271 150 - 400 K/uL  Hemoglobin and hematocrit, blood  Result Value Ref Range   Hemoglobin 10.1 (L) 13.0 - 17.0 g/dL   HCT 30.6 (L) 39.0 - 70.2 %  Basic metabolic panel  Result Value Ref Range   Sodium 136 135 - 145 mmol/L   Potassium 3.8 3.5 - 5.1 mmol/L   Chloride 102 101 - 111 mmol/L   CO2 28 22 - 32 mmol/L   Glucose, Bld 150 (H) 65 - 99 mg/dL   BUN 6 6 - 20 mg/dL   Creatinine, Ser 0.85 0.61 - 1.24 mg/dL   Calcium 7.9 (L) 8.9 - 10.3 mg/dL   GFR calc non Af Amer >60 >60 mL/min   GFR calc Af Amer >60 >60 mL/min   Anion gap 6 5 - 15  CBC with Differential/Platelet  Result Value Ref Range   WBC 8.6 4.0 - 10.5 K/uL   RBC 3.25 (L) 4.22 - 5.81 MIL/uL   Hemoglobin 10.0 (L) 13.0 - 17.0 g/dL   HCT 29.4 (L) 39.0 - 52.0 %  MCV 90.5 78.0 - 100.0 fL   MCH 30.8 26.0 - 34.0 pg   MCHC 34.0 30.0 - 36.0 g/dL   RDW 12.4 11.5 - 15.5 %   Platelets 230 150 - 400 K/uL   Neutrophils Relative % 71 %   Neutro Abs 6.0 1.7 - 7.7 K/uL   Lymphocytes Relative 19 %   Lymphs Abs 1.7 0.7 - 4.0 K/uL   Monocytes Relative 6 %   Monocytes Absolute 0.5 0.1 - 1.0 K/uL   Eosinophils Relative 4 %   Eosinophils Absolute 0.4 0.0 - 0.7 K/uL   Basophils Relative 0 %   Basophils Absolute 0.0 0.0 - 0.1 K/uL  Sedimentation rate  Result Value Ref Range   Sed Rate 74 (H) 0 - 16 mm/hr  C-reactive protein  Result Value Ref Range   CRP 18.5 (H) <1.0 mg/dL  Basic metabolic panel  Result Value Ref Range   Sodium 137 135 - 145 mmol/L   Potassium 3.4 (L) 3.5 - 5.1 mmol/L   Chloride 101 101 - 111 mmol/L   CO2 30 22 - 32 mmol/L   Glucose, Bld 142 (H) 65 - 99 mg/dL   BUN <5 (L) 6 - 20 mg/dL   Creatinine, Ser 0.80 0.61 - 1.24 mg/dL   Calcium 8.2 (L) 8.9 - 10.3 mg/dL   GFR  calc non Af Amer >60 >60 mL/min   GFR calc Af Amer >60 >60 mL/min   Anion gap 6 5 - 15  Vancomycin, trough  Result Value Ref Range   Vancomycin Tr 18 15 - 20 ug/mL  I-STAT 4, (NA,K, GLUC, HGB,HCT)  Result Value Ref Range   Sodium 140 135 - 145 mmol/L   Potassium 4.2 3.5 - 5.1 mmol/L   Glucose, Bld 115 (H) 65 - 99 mg/dL   HCT 29.0 (L) 39.0 - 52.0 %   Hemoglobin 9.9 (L) 13.0 - 17.0 g/dL  Type and screen [place]  Result Value Ref Range   ABO/RH(D) A NEG    Antibody Screen NEG    Sample Expiration 08/12/2016   ABO/Rh  Result Value Ref Range   ABO/RH(D) A NEG     Discharge Medications:   Allergies as of 08/12/2016      Reactions   Methotrexate Derivatives Nausea And Vomiting, Other (See Comments)   Mood change   Ultram [tramadol Hcl] Nausea And Vomiting      Medication List    STOP taking these medications   doxycycline 100 MG capsule Commonly known as:  VIBRAMYCIN     TAKE these medications   amoxicillin 500 MG capsule Commonly known as:  AMOXIL Take 1 capsule (500 mg total) by mouth 3 (three) times daily.   aspirin EC 81 MG tablet Take 81 mg by mouth at bedtime.   atorvastatin 20 MG tablet Commonly known as:  LIPITOR Take 20 mg by mouth every other day.   AXIRON 30 MG/ACT Soln Generic drug:  Testosterone Place 60 mg onto the skin daily. 1 pump under each arm daily = 2 pumps daily   b complex vitamins tablet Take 1 tablet by mouth daily.   budesonide-formoterol 160-4.5 MCG/ACT inhaler Commonly known as:  SYMBICORT Inhale 2 puffs into the lungs 2 (two) times daily.   ceFAZolin 2-4 GM/100ML-% IVPB Commonly known as:  ANCEF Inject 100 mLs (2 g total) into the vein every 8 (eight) hours.   cetirizine 10 MG tablet Commonly known as:  ZYRTEC Take 10 mg by mouth daily.  DULoxetine 30 MG capsule Commonly known as:  CYMBALTA Take 30 mg by mouth daily. Take with a 60 mg capsule for a 90 mg dose   DULoxetine 60 MG capsule Commonly known  as:  CYMBALTA Take 60 mg by mouth daily. Take with a 30 mg capsule for a 90 mg dose   esomeprazole 20 MG capsule Commonly known as:  NEXIUM Take 20 mg by mouth daily.   finasteride 5 MG tablet Commonly known as:  PROSCAR Take 5 mg by mouth daily.   Fish Oil 1000 MG Caps Take 1,000 mg by mouth at bedtime.   fluticasone 50 MCG/ACT nasal spray Commonly known as:  FLONASE Place 1 spray into both nostrils daily as needed for allergies.   ipratropium-albuterol 0.5-2.5 (3) MG/3ML Soln Commonly known as:  DUONEB Take 3 mLs by nebulization every 6 (six) hours as needed. What changed:  reasons to take this   lisinopril 5 MG tablet Commonly known as:  PRINIVIL,ZESTRIL Take 5 mg by mouth at bedtime.   methocarbamol 500 MG tablet Commonly known as:  ROBAXIN Take 1 tablet (500 mg total) by mouth 3 (three) times daily as needed.   multivitamin with minerals Tabs tablet Take 1 tablet by mouth at bedtime.   oxyCODONE-acetaminophen 7.5-325 MG tablet Commonly known as:  PERCOCET Take 1-2 tablets by mouth every 4 (four) hours as needed for severe pain. What changed:  Another medication with the same name was removed. Continue taking this medication, and follow the directions you see here.   PROAIR HFA 108 (90 Base) MCG/ACT inhaler Generic drug:  albuterol Inhale 2 puffs into the lungs every 6 (six) hours as needed for wheezing or shortness of breath.   rifampin 300 MG capsule Commonly known as:  RIFADIN Take 1 capsule (300 mg total) by mouth 2 (two) times daily.   sildenafil 20 MG tablet Commonly known as:  REVATIO TAKE 2-5 TABLETS AS NEEDED   tamsulosin 0.4 MG Caps capsule Commonly known as:  FLOMAX Take 0.4 mg by mouth 2 (two) times daily.   vancomycin 1,500 mg in sodium chloride 0.9 % 500 mL Inject 1,500 mg into the vein every 8 (eight) hours.   vitamin C 500 MG tablet Commonly known as:  ASCORBIC ACID Take 500 mg by mouth daily.       Diagnostic Studies: Ct Shoulder  Left Wo Contrast  Result Date: 07/29/2016 CLINICAL DATA:  History of reverse left shoulder arthroplasty 01/30/2016 with subsequent infection. The patient underwent irrigation debridement of the shoulder and polyethylene exchange on 03/31/2016. EXAM: CT OF THE UPPER LEFT EXTREMITY WITHOUT CONTRAST TECHNIQUE: Multidetector CT imaging of the upper left extremity was performed according to the standard protocol. COMPARISON:  None. FINDINGS: Bones/Joint/Cartilage Reverse shoulder arthroplasty is in place. No lucency about the hardware to suggest loosening is identified. There is periosteal reaction about the humeral component which is most conspicuous anteriorly and laterally. No fracture is identified. The device is located. An osseous fragment or calcification measuring 1.3 cm transverse by 2.0 cm craniocaudal by up to 0.4 cm AP is seen in the coracobrachialis muscle 1 cm anterior to the proximal humerus. The top of this fragment is at the level of the top of the humeral component of the patient's shoulder replacement. A second bone fragment measuring 2.4 cm craniocaudal by 0.9 cm AP by 0.3 cm transverse is seen posterior to the inferior border of the neck of the scapula in the teres minor. Ligaments Suboptimally assessed by CT. Muscles and Tendons Although  visualization is somewhat limited by streak artifact from the patient's shoulder replacement, there is a fluid collection containing locules of gas which measures approximately 3.5 cm transverse by 3.0 cm AP by 3.1 cm craniocaudal anterior to the proximal humerus in the deltoid muscle. The collection appears to extend into the pectoralis major. There is stranding and overlying subcutaneous fat. This area of abnormality appears to be deep to a scar along the anterior aspect of the shoulder and upper arm. No glenohumeral joint effusion is identified. Soft tissues Stranding in subcutaneous fat is seen anterior to the shoulder. The lungs are emphysematous.  Atherosclerosis noted. IMPRESSION: Study limited by streak artifact demonstrating findings highly suspicious for a soft tissue abscess with an air and fluid collection in the anterior aspect of the deltoid and likely pectoralis major muscles at the level of the proximal humerus. Periosteal reaction about the proximal humerus anteriorly and laterally could be due to the patient's prior surgery but is worrisome for osteomyelitis. Small bone fragment in the proximal coracobrachialis and teres minor may represent heterotopic ossification related to prior surgery. Emphysema. These results were called by telephone at the time of interpretation on 07/29/2016 at 10:19 am to Ald[place]., who verbally acknowledged these results. Electronically Signed   By: Inge Rise M.D.   On: 07/29/2016 10:21   Dg Shoulder Left Port  Result Date: 08/09/2016 CLINICAL DATA:  66 year old male with hardware removal with placement of antibiotic spacer. Subsequent encounter. EXAM: LEFT SHOULDER - 1 VIEW COMPARISON:  07/29/2016 CT. FINDINGS: Left shoulder prosthesis removed and antibiotic spacer placed. No complication detected. Acromioclavicular joint degenerative changes. Visualized lungs clear. IMPRESSION: Left shoulder prosthesis removed and antibiotic spacer placed. No complication detected. Electronically Signed   By: Genia Del M.D.   On: 08/09/2016 20:45   Dg Fluoro Guided Needle Plc Aspiration/injection Loc  Result Date: 08/04/2016 CLINICAL DATA:  Recurrent left shoulder effusion. Left total shoulder arthroplasty. FLUOROSCOPY TIME:  Radiation Exposure Index (as provided by the fluoroscopic device): 54.34 uGy*m2 Fluoroscopy Time:  34 seconds Number of Acquired Images:  0 PROCEDURE: Left SHOULDER ASPIRATION UNDER FLUOROSCOPY An appropriate skin entrance site was determined that the level of the humeral component of the arthroplasty. This corresponds with the fluid collection on CT. The site was marked, prepped with  Betadine, draped in the usual sterile fashion, and infiltrated locally with 1% Lidocaine. A 22 gauge spinal needle was advanced to the superomedial margin of the residual humerus, just the low the cup of the prosthesis under intermittent fluoroscopy. I was then able to aspirate approximately 1 mL of sanguinous material. I reposition the needle more inferiorly and medially and was able to acquire sanguinous purulent material. A total of 4 mL was collected.  Samples were sent as requested. The patient tolerated the procedure without immediate complication. IMPRESSION: Technically successful left shoulder aspiration. These results were called by telephone at the time of interpretation on 08/04/2016 at 12:25 PM to Dr. Netta Cedars , who verbally acknowledged these results. Electronically Signed   By: San Morelle M.D.   On: 08/04/2016 17:01    Disposition: 01-Home or Self Care  Discharge Instructions    Call MD / Call 911    Complete by:  As directed    If you experience chest pain or shortness of breath, CALL 911 and be transported to the hospital emergency room.  If you develope a fever above 101 F, pus (white drainage) or increased drainage or redness at the wound, or calf pain, call  your surgeon's office.   Constipation Prevention    Complete by:  As directed    Drink plenty of fluids.  Prune juice may be helpful.  You may use a stool softener, such as Colace (over the counter) 100 mg twice a day.  Use MiraLax (over the counter) for constipation as needed.   Diet - low sodium heart healthy    Complete by:  As directed    Increase activity slowly as tolerated    Complete by:  As directed       Follow-up Information    [facility] Follow up.   Why:  A representative from [facility] will contact you to arrange start date and time for [facility].  Contact information: [address] [place] [address] [phone]         PATIENT WILL NEED THERAPEUTIC  VANCOMYCIN DRUG MONITORING WHILE ON HOME IV VANCOMYCIN as directed by pharmacist with [facility]  Follow up ID clinic  Follow up with Dr Veverly Fells in two weeks [phone]   Signed: Aunna Snooks,STEVEN R 08/12/2016, 7:14 AM

## 2016-08-16 LAB — AEROBIC/ANAEROBIC CULTURE W GRAM STAIN (SURGICAL/DEEP WOUND)

## 2016-08-16 LAB — AEROBIC/ANAEROBIC CULTURE (SURGICAL/DEEP WOUND)

## 2016-08-18 LAB — ANAEROBIC CULTURE: Gram Stain: NONE SEEN

## 2016-08-20 DIAGNOSIS — Z96612 Presence of left artificial shoulder joint: Secondary | ICD-10-CM | POA: Diagnosis not present

## 2016-08-20 DIAGNOSIS — Z452 Encounter for adjustment and management of vascular access device: Secondary | ICD-10-CM | POA: Diagnosis not present

## 2016-08-20 DIAGNOSIS — J45909 Unspecified asthma, uncomplicated: Secondary | ICD-10-CM | POA: Diagnosis not present

## 2016-08-20 DIAGNOSIS — Z96619 Presence of unspecified artificial shoulder joint: Secondary | ICD-10-CM | POA: Diagnosis not present

## 2016-08-20 DIAGNOSIS — G4733 Obstructive sleep apnea (adult) (pediatric): Secondary | ICD-10-CM | POA: Diagnosis not present

## 2016-08-20 DIAGNOSIS — I10 Essential (primary) hypertension: Secondary | ICD-10-CM | POA: Diagnosis not present

## 2016-08-20 DIAGNOSIS — J449 Chronic obstructive pulmonary disease, unspecified: Secondary | ICD-10-CM | POA: Diagnosis not present

## 2016-08-20 DIAGNOSIS — T8459XA Infection and inflammatory reaction due to other internal joint prosthesis, initial encounter: Secondary | ICD-10-CM | POA: Diagnosis not present

## 2016-08-24 DIAGNOSIS — Z96612 Presence of left artificial shoulder joint: Secondary | ICD-10-CM | POA: Diagnosis not present

## 2016-08-24 DIAGNOSIS — Z4789 Encounter for other orthopedic aftercare: Secondary | ICD-10-CM | POA: Diagnosis not present

## 2016-08-24 DIAGNOSIS — Z471 Aftercare following joint replacement surgery: Secondary | ICD-10-CM | POA: Diagnosis not present

## 2016-08-24 DIAGNOSIS — T8450XD Infection and inflammatory reaction due to unspecified internal joint prosthesis, subsequent encounter: Secondary | ICD-10-CM | POA: Diagnosis not present

## 2016-08-25 ENCOUNTER — Encounter: Payer: Self-pay | Admitting: Infectious Diseases

## 2016-08-25 ENCOUNTER — Ambulatory Visit (INDEPENDENT_AMBULATORY_CARE_PROVIDER_SITE_OTHER): Payer: Medicare Other | Admitting: Infectious Diseases

## 2016-08-25 DIAGNOSIS — T8459XD Infection and inflammatory reaction due to other internal joint prosthesis, subsequent encounter: Secondary | ICD-10-CM | POA: Diagnosis not present

## 2016-08-25 DIAGNOSIS — Z96619 Presence of unspecified artificial shoulder joint: Secondary | ICD-10-CM

## 2016-08-25 NOTE — Progress Notes (Signed)
   Subjective:    Patient ID: Frederick Guzman, male    DOB: 1950-04-17, 66 y.o.   MRN: [id number]  HPI 66 yo M with hx of L shoulder arthroplasty 01-30-16, subsequent I & D due to infection on 04-01-16.  His Cx grew MSSE and P acnes.  He was treated with amp-rifampin.  He returned on 08-09-16 and underwent resection of his prosthesis. His repeat Cx were (-). He was d/c home on ancef.  He is anticipating a 2 stage procedure.    08-20-16  08-11-16 ESR 52  74 CRP 34.4  18.5  Since d/c he has been having difficulty with continued shoulder pain. Otherwise states he feels the best he has in years.  No problems with PIC line. No erythema, flushes well. He has had no difficulty with his anbx.   Review of Systems  Constitutional: Negative for appetite change, chills, fever and unexpected weight change.  Respiratory: Negative for cough and shortness of breath.   Cardiovascular: Positive for leg swelling.  Gastrointestinal: Negative for constipation and diarrhea.  Genitourinary: Negative for difficulty urinating.  Musculoskeletal: Positive for arthralgias and joint swelling.  was given compression stockings for LE edema since d/c. Worse at end of day.  He took probiotics since d/c.     Objective:   Physical Exam  Constitutional: He appears well-developed and well-nourished.  HENT:  Mouth/Throat: No oropharyngeal exudate.  Eyes: EOM are normal. Pupils are equal, round, and reactive to light.  Neck: Neck supple.  Cardiovascular: Normal rate, regular rhythm and normal heart sounds.   Pulmonary/Chest: Effort normal and breath sounds normal.  Abdominal: Soft. Bowel sounds are normal. There is no tenderness. There is no rebound.  Musculoskeletal: He exhibits no tenderness.       Arms: Lymphadenopathy:    He has no cervical adenopathy.      Assessment & Plan:

## 2016-08-25 NOTE — Assessment & Plan Note (Addendum)
His anbx end date is July 4.  His ESR is better, his CRP is worse. Difficult to interpret.  Will continue his IV anbx til expected stop date.  Continue to follow his inflammatory markers.  I explained his lab work to he and his wife.  Will see him back prior to July 4 to decide on continuing his IV anbx or switch to IV.

## 2016-08-27 DIAGNOSIS — J45909 Unspecified asthma, uncomplicated: Secondary | ICD-10-CM | POA: Diagnosis not present

## 2016-08-27 DIAGNOSIS — T8459XA Infection and inflammatory reaction due to other internal joint prosthesis, initial encounter: Secondary | ICD-10-CM | POA: Diagnosis not present

## 2016-08-27 DIAGNOSIS — G4733 Obstructive sleep apnea (adult) (pediatric): Secondary | ICD-10-CM | POA: Diagnosis not present

## 2016-08-27 DIAGNOSIS — Z96619 Presence of unspecified artificial shoulder joint: Secondary | ICD-10-CM | POA: Diagnosis not present

## 2016-08-27 DIAGNOSIS — I10 Essential (primary) hypertension: Secondary | ICD-10-CM | POA: Diagnosis not present

## 2016-08-27 DIAGNOSIS — J449 Chronic obstructive pulmonary disease, unspecified: Secondary | ICD-10-CM | POA: Diagnosis not present

## 2016-08-27 DIAGNOSIS — Z452 Encounter for adjustment and management of vascular access device: Secondary | ICD-10-CM | POA: Diagnosis not present

## 2016-08-27 DIAGNOSIS — Z96612 Presence of left artificial shoulder joint: Secondary | ICD-10-CM | POA: Diagnosis not present

## 2016-08-31 ENCOUNTER — Telehealth: Payer: Self-pay

## 2016-08-31 NOTE — Telephone Encounter (Signed)
Pt's wife called sta

## 2016-09-03 DIAGNOSIS — J449 Chronic obstructive pulmonary disease, unspecified: Secondary | ICD-10-CM | POA: Diagnosis not present

## 2016-09-03 DIAGNOSIS — T8459XA Infection and inflammatory reaction due to other internal joint prosthesis, initial encounter: Secondary | ICD-10-CM | POA: Diagnosis not present

## 2016-09-03 DIAGNOSIS — G4733 Obstructive sleep apnea (adult) (pediatric): Secondary | ICD-10-CM | POA: Diagnosis not present

## 2016-09-03 DIAGNOSIS — Z96612 Presence of left artificial shoulder joint: Secondary | ICD-10-CM | POA: Diagnosis not present

## 2016-09-03 DIAGNOSIS — Z96619 Presence of unspecified artificial shoulder joint: Secondary | ICD-10-CM | POA: Diagnosis not present

## 2016-09-03 DIAGNOSIS — I10 Essential (primary) hypertension: Secondary | ICD-10-CM | POA: Diagnosis not present

## 2016-09-03 DIAGNOSIS — Z452 Encounter for adjustment and management of vascular access device: Secondary | ICD-10-CM | POA: Diagnosis not present

## 2016-09-03 DIAGNOSIS — J45909 Unspecified asthma, uncomplicated: Secondary | ICD-10-CM | POA: Diagnosis not present

## 2016-09-07 DIAGNOSIS — Z96612 Presence of left artificial shoulder joint: Secondary | ICD-10-CM | POA: Diagnosis not present

## 2016-09-07 DIAGNOSIS — Z471 Aftercare following joint replacement surgery: Secondary | ICD-10-CM | POA: Diagnosis not present

## 2016-09-07 DIAGNOSIS — T8450XD Infection and inflammatory reaction due to unspecified internal joint prosthesis, subsequent encounter: Secondary | ICD-10-CM | POA: Diagnosis not present

## 2016-09-07 LAB — FUNGUS CULTURE WITH STAIN

## 2016-09-07 LAB — FUNGUS CULTURE RESULT

## 2016-09-07 LAB — FUNGAL ORGANISM REFLEX

## 2016-09-10 DIAGNOSIS — J449 Chronic obstructive pulmonary disease, unspecified: Secondary | ICD-10-CM | POA: Diagnosis not present

## 2016-09-10 DIAGNOSIS — G4733 Obstructive sleep apnea (adult) (pediatric): Secondary | ICD-10-CM | POA: Diagnosis not present

## 2016-09-10 DIAGNOSIS — Z452 Encounter for adjustment and management of vascular access device: Secondary | ICD-10-CM | POA: Diagnosis not present

## 2016-09-10 DIAGNOSIS — J45909 Unspecified asthma, uncomplicated: Secondary | ICD-10-CM | POA: Diagnosis not present

## 2016-09-10 DIAGNOSIS — Z96612 Presence of left artificial shoulder joint: Secondary | ICD-10-CM | POA: Diagnosis not present

## 2016-09-10 DIAGNOSIS — I10 Essential (primary) hypertension: Secondary | ICD-10-CM | POA: Diagnosis not present

## 2016-09-10 DIAGNOSIS — T8459XA Infection and inflammatory reaction due to other internal joint prosthesis, initial encounter: Secondary | ICD-10-CM | POA: Diagnosis not present

## 2016-09-13 DIAGNOSIS — N401 Enlarged prostate with lower urinary tract symptoms: Secondary | ICD-10-CM | POA: Diagnosis not present

## 2016-09-13 DIAGNOSIS — Z8042 Family history of malignant neoplasm of prostate: Secondary | ICD-10-CM | POA: Diagnosis not present

## 2016-09-13 DIAGNOSIS — I10 Essential (primary) hypertension: Secondary | ICD-10-CM | POA: Diagnosis not present

## 2016-09-13 DIAGNOSIS — E291 Testicular hypofunction: Secondary | ICD-10-CM | POA: Diagnosis not present

## 2016-09-17 DIAGNOSIS — J45909 Unspecified asthma, uncomplicated: Secondary | ICD-10-CM | POA: Diagnosis not present

## 2016-09-17 DIAGNOSIS — Z452 Encounter for adjustment and management of vascular access device: Secondary | ICD-10-CM | POA: Diagnosis not present

## 2016-09-17 DIAGNOSIS — G4733 Obstructive sleep apnea (adult) (pediatric): Secondary | ICD-10-CM | POA: Diagnosis not present

## 2016-09-17 DIAGNOSIS — T8459XA Infection and inflammatory reaction due to other internal joint prosthesis, initial encounter: Secondary | ICD-10-CM | POA: Diagnosis not present

## 2016-09-17 DIAGNOSIS — I10 Essential (primary) hypertension: Secondary | ICD-10-CM | POA: Diagnosis not present

## 2016-09-17 DIAGNOSIS — Z96619 Presence of unspecified artificial shoulder joint: Secondary | ICD-10-CM | POA: Diagnosis not present

## 2016-09-17 DIAGNOSIS — Z96612 Presence of left artificial shoulder joint: Secondary | ICD-10-CM | POA: Diagnosis not present

## 2016-09-17 DIAGNOSIS — J449 Chronic obstructive pulmonary disease, unspecified: Secondary | ICD-10-CM | POA: Diagnosis not present

## 2016-09-20 ENCOUNTER — Ambulatory Visit (INDEPENDENT_AMBULATORY_CARE_PROVIDER_SITE_OTHER): Payer: Medicare Other | Admitting: Infectious Diseases

## 2016-09-20 ENCOUNTER — Encounter: Payer: Self-pay | Admitting: Infectious Diseases

## 2016-09-20 DIAGNOSIS — T8459XD Infection and inflammatory reaction due to other internal joint prosthesis, subsequent encounter: Secondary | ICD-10-CM | POA: Diagnosis not present

## 2016-09-20 DIAGNOSIS — Z96619 Presence of unspecified artificial shoulder joint: Secondary | ICD-10-CM | POA: Diagnosis not present

## 2016-09-20 DIAGNOSIS — E119 Type 2 diabetes mellitus without complications: Secondary | ICD-10-CM | POA: Diagnosis not present

## 2016-09-20 MED ORDER — DOXYCYCLINE HYCLATE 100 MG PO TABS: 100.0000 mg | ORAL_TABLET | Freq: Two times a day (BID) | ORAL | 0 refills | Status: DC

## 2016-09-20 NOTE — Progress Notes (Signed)
   Subjective:    Patient ID: Frederick Guzman, male    DOB: 1950-10-29, 66 y.o.   MRN: [id number]  HPI 66 yo M with hx of L shoulder arthroplasty 01-30-16, subsequent I & D due to infection on 04-01-16.  His Cx grew MSSE and P acnes.  He was treated with amp-rifampin.  He returned on 08-09-16 and underwent resection of his prosthesis. His repeat Cx were (-). He was d/c home on ancef.  He is anticipating a 2 stage procedure.            09-17-16  09-10-16  09-03-16   08-20-16              08-11-16 ESR   _0 52                    74 CRP  6.5   3.2  15.4         34.4                 18.5   His home health nurse has been noting increased Glc (up to 500). He has no hx of DM.  His FSG was 274 this AM. Has been tapered on his prednisone to 2.5 mg qday. Was previously was started on this due to concerns about ADR (tapering dose pack).   His shoulder is still frozen. Has no swelling. Has some concern that there is a "pus pocket" on his wound.   Review of Systems  Constitutional: Positive for fatigue. Negative for appetite change and unexpected weight change.  Gastrointestinal: Negative for constipation and diarrhea.  Genitourinary: Positive for frequency. Negative for difficulty urinating.  Neurological: Positive for dizziness.  nocturia Has decreased appetite, making himself eat.     Objective:   Physical Exam  Constitutional: He appears well-developed and well-nourished.  HENT:  Mouth/Throat: No oropharyngeal exudate.  Eyes: EOM are normal. Pupils are equal, round, and reactive to light.  Neck: Neck supple.  Cardiovascular: Normal rate, regular rhythm and normal heart sounds.   Pulmonary/Chest: Effort normal and breath sounds normal.  Abdominal: Soft. Bowel sounds are normal. There is no tenderness. There is no rebound.  Musculoskeletal:       Arms: Lymphadenopathy:    He has no cervical adenopathy.            Assessment & Plan:

## 2016-09-20 NOTE — Progress Notes (Signed)
RN received order to discontinue the patient's PICC line.  Patient identified with name and date of birth. PICC dressing removed, site unremarkable.  PICC line removed using sterile procedure @ 1030. PICC length equal to that noted in patient's hospital chart of 45 cm. Sterile petroleum gauze + sterile 4X4 applied to PICC site, pressure applied for 10 minutes and covered with Medipore tape as a pressure dressing. Patient tolerated procedure without complaints.  Patient instructed to limit use of arm for 1 hour. Patient instructed that the pressure dressing should remain in place for 24 hours. Patient verbalized understanding of these instructions.

## 2016-09-20 NOTE — Assessment & Plan Note (Signed)
Will pull his PIC today Will stop his ancef Change to doxy Plan to continue this til he has surgery.

## 2016-09-20 NOTE — Assessment & Plan Note (Signed)
I am very concerned about his Glc.  Stop his Pred 2.5 mg after 3 doses.  Will get him appt at [facility] in [place].

## 2016-09-21 ENCOUNTER — Ambulatory Visit (INDEPENDENT_AMBULATORY_CARE_PROVIDER_SITE_OTHER): Payer: Medicare Other | Admitting: Osteopathic Medicine

## 2016-09-21 ENCOUNTER — Other Ambulatory Visit: Payer: Self-pay

## 2016-09-21 ENCOUNTER — Encounter: Payer: Self-pay | Admitting: Osteopathic Medicine

## 2016-09-21 VITALS — BP 139/73 | HR 99 | Ht 71.75 in | Wt 189.0 lb

## 2016-09-21 DIAGNOSIS — Z1331 Encounter for screening for depression: Secondary | ICD-10-CM | POA: Insufficient documentation

## 2016-09-21 DIAGNOSIS — E1165 Type 2 diabetes mellitus with hyperglycemia: Secondary | ICD-10-CM

## 2016-09-21 DIAGNOSIS — F17200 Nicotine dependence, unspecified, uncomplicated: Secondary | ICD-10-CM | POA: Diagnosis not present

## 2016-09-21 DIAGNOSIS — Z96612 Presence of left artificial shoulder joint: Secondary | ICD-10-CM | POA: Diagnosis not present

## 2016-09-21 DIAGNOSIS — E1169 Type 2 diabetes mellitus with other specified complication: Secondary | ICD-10-CM | POA: Insufficient documentation

## 2016-09-21 DIAGNOSIS — Z1389 Encounter for screening for other disorder: Secondary | ICD-10-CM

## 2016-09-21 DIAGNOSIS — T847XXD Infection and inflammatory reaction due to other internal orthopedic prosthetic devices, implants and grafts, subsequent encounter: Secondary | ICD-10-CM | POA: Diagnosis not present

## 2016-09-21 DIAGNOSIS — I10 Essential (primary) hypertension: Secondary | ICD-10-CM | POA: Diagnosis not present

## 2016-09-21 HISTORY — DX: Type 2 diabetes mellitus with hyperglycemia: E11.65

## 2016-09-21 HISTORY — DX: Encounter for screening for depression: Z13.31

## 2016-09-21 LAB — POCT GLYCOSYLATED HEMOGLOBIN (HGB A1C): Hemoglobin A1C: 8.4

## 2016-09-21 MED ORDER — GLUCOSE BLOOD VI STRP: ORAL_STRIP | 99 refills | Status: DC

## 2016-09-21 MED ORDER — FREESTYLE LANCETS MISC: 99 refills | Status: DC

## 2016-09-21 MED ORDER — METFORMIN HCL ER 500 MG PO TB24: 1000.0000 mg | ORAL_TABLET | Freq: Two times a day (BID) | ORAL | 1 refills | Status: DC

## 2016-09-21 NOTE — Progress Notes (Signed)
HPI: Frederick Guzman is a 66 y.o. male  who presents to [facility] today, 09/21/16,  for chief complaint of:  Chief Complaint  Patient presents with  . Establish Care   Long-standing history of elevated blood sugars but patient has been reluctant to initiate medication therapy.  Recent hospitalization for procedure to address infection joint prosthesis in shoulder. F/u in place w/ ortho and ID. PICC recently removed - ancef d/c and switched to Doxy. Was told he had elevated sugar levels and should see PCP so he is here to establish care.   At this point, given infection issues and persistent fatigue as well as increasing A1c levels, patient is amenable to starting therapy and getting serious about diet/exercise modifications.  Tobacco abuse is also an issue, patient does not yet feel ready to talk about quitting.    DIABETES SCREENING/PREVENTIVE CARE: A1C past 3-6 mos: Yes  controlled? No   09/21/16 8.4% BP goal <130/80: Yes  LDL goal <70: unknown  Eye exam annually: not yet, importance discussed with patient Foot exam: will get at next visit Microalbuminuria: N/a on ACE/ARB Metformin: started 09/2016 ACE/ARB: Yes  Antiplatelet if ASCVD Risk >10%: will discuss next visit Statin: will discuss Pneumovax: will discuss     Past medical, surgical, social and family history reviewed: Patient Active Problem List   Diagnosis Date Noted  . Type 2 diabetes mellitus without complications ([place]) [id number]/[id number]  . Status post shoulder surgery 08/09/2016  . Prosthetic shoulder infection, subsequent encounter 08/09/2016  . Infection and inflammatory reaction due to internal orthopedic device, implant, and graft ([place]) 03/31/2016  . S/P shoulder replacement, left 01/30/2016  . HCAP (healthcare-associated pneumonia) 07/13/2015  . CAP (community acquired pneumonia) 07/13/2015  . Depression   . HTN (hypertension)   . Asthma exacerbation   . MDD (major  depressive disorder) ([place]) 04/30/2011  . GERD 11/23/2010  . DERMATITIS, ATOPIC 11/23/2010  . TOBACCO ABUSE 03/10/2007  . OBSTRUCTIVE SLEEP APNEA 03/10/2007  . BRONCHITIS, CHRONIC 03/10/2007  . ASTHMA, MODERATE 03/10/2007   Past Surgical History:  Procedure Laterality Date  . COLONOSCOPY W/ POLYPECTOMY    . COLONOSCOPY W/ POLYPECTOMY    . EXCISIONAL TOTAL SHOULDER ARTHROPLASTY WITH ANTIBIOTIC SPACER Left 08/09/2016   Procedure: LEFT SHOULDER HARDWARE REMOVAL, IRRIGATION AND DEBRIDEMENT, PLACEMENT OF ANTIBIOTIC SPACER;  Surgeon: Netta Cedars, MD;  Location: [facility];  Service: Orthopedics;  Laterality: Left;  . IRRIGATION AND DEBRIDEMENT SHOULDER Left 03/31/2016   Procedure: IRRIGATION AND DEBRIDEMENT SHOULDER, deep cultures and polyethylene exchange;  Surgeon: Netta Cedars, MD;  Location: [facility];  Service: Orthopedics;  Laterality: Left;  requests 26mins  . REVERSE SHOULDER ARTHROPLASTY Left 01/30/2016   Procedure: LEFT REVERSE SHOULDER ARTHROPLASTY;  Surgeon: Netta Cedars, MD;  Location: [facility];  Service: Orthopedics;  Laterality: Left;  . ROTATOR CUFF REPAIR Left   . WRIST SURGERY Right 11/2014   Dr. Amedeo Plenty   Social History  Substance Use Topics  . Smoking status: Current Every Day Smoker    Packs/day: 1.00    Years: 42.00  . Smokeless tobacco: Never Used     Comment: cutting back  . Alcohol use No     Comment: beer or mixed drink    No family history on file.   Current medication list and allergy/intolerance information reviewed:   Current Outpatient Prescriptions  Medication Sig Dispense Refill  . albuterol (PROAIR HFA) 108 (90 Base) MCG/ACT inhaler Inhale 2 puffs into the lungs every 6 (six) hours as needed  for wheezing or shortness of breath.    Marland Kitchen aspirin EC 81 MG tablet Take 81 mg by mouth at bedtime.    Marland Kitchen atorvastatin (LIPITOR) 20 MG tablet Take 20 mg by mouth every other day.   0  . b complex vitamins tablet Take 1 tablet by mouth daily.    . budesonide-formoterol  (SYMBICORT) 160-4.5 MCG/ACT inhaler Inhale 2 puffs into the lungs 2 (two) times daily.    . cetirizine (ZYRTEC) 10 MG tablet Take 10 mg by mouth daily.    Marland Kitchen doxycycline (VIBRA-TABS) 100 MG tablet Take 1 tablet (100 mg total) by mouth 2 (two) times daily. 20 tablet 0  . DULoxetine (CYMBALTA) 60 MG capsule Take 60 mg by mouth daily.    Marland Kitchen esomeprazole (NEXIUM) 20 MG capsule Take 20 mg by mouth daily.     . finasteride (PROSCAR) 5 MG tablet Take 5 mg by mouth daily.     . fluticasone (FLONASE) 50 MCG/ACT nasal spray Place 1 spray into both nostrils daily as needed for allergies.  1  . ipratropium-albuterol (DUONEB) 0.5-2.5 (3) MG/3ML SOLN Take 3 mLs by nebulization every 6 (six) hours as needed. 360 mL 1  . lisinopril (PRINIVIL,ZESTRIL) 5 MG tablet Take 5 mg by mouth at bedtime.     . Multiple Vitamin (MULTIVITAMIN WITH MINERALS) TABS tablet Take 1 tablet by mouth at bedtime.    . Omega-3 Fatty Acids (FISH OIL) 1000 MG CAPS Take 1,000 mg by mouth at bedtime.     Marland Kitchen oxyCODONE-acetaminophen (PERCOCET) 7.5-325 MG tablet Take 1-2 tablets by mouth every 4 (four) hours as needed for severe pain. (Patient not taking: Reported on 09/20/2016) 40 tablet 0  . predniSONE (DELTASONE) 2.5 MG tablet Take 2.5 mg by mouth daily with breakfast.    . sildenafil (REVATIO) 20 MG tablet TAKE 2-5 TABLETS AS NEEDED  3  . Tamsulosin HCl (FLOMAX) 0.4 MG CAPS Take 0.4 mg by mouth 2 (two) times daily.     . Testosterone (AXIRON) 30 MG/ACT SOLN Place 60 mg onto the skin daily. 1 pump under each arm daily = 2 pumps daily    . vitamin C (ASCORBIC ACID) 500 MG tablet Take 500 mg by mouth daily.     No current facility-administered medications for this visit.    Allergies  Allergen Reactions  . Methotrexate Derivatives Nausea And Vomiting and Other (See Comments)    Mood change  . Ultram [Tramadol Hcl] Nausea And Vomiting      Review of Systems:  Constitutional:  No  fever, no chills, No recent illness, No unintentional  weight changes. No significant fatigue.   HEENT: No  headache, no vision change, no hearing change, No sore throat, No  sinus pressure  Cardiac: No  chest pain, No  pressure, No palpitations, No  Orthopnea  Respiratory:  No  shortness of breath. No  Cough  Gastrointestinal: No  abdominal pain, No  nausea, No  vomiting,  No  blood in stool, No  diarrhea, No  constipation   Musculoskeletal: No new myalgia/arthralgia  Skin: No  Rash  Hem/Onc: No  easy bruising/bleeding  Endocrine: No cold intolerance,  No heat intolerance. No polyuria/polydipsia/polyphagia   Neurologic: No  weakness, No  dizziness, No  slurred speech/focal weakness/facial droop  Psychiatric: No  concerns with depression, +concerns with anxiety, No sleep problems, No mood problems  Exam:  BP 139/73   Pulse 99   Ht 5' 11.75" (1.822 m)   Wt 189 lb (85.7 kg)  SpO2 96%   BMI 25.81 kg/m   Constitutional: VS see above. General Appearance: alert, well-developed, well-nourished, NAD  Ears, Nose, Mouth, Throat: MMM, Normal external inspection ears/nares/mouth/lips/gums.   Neck: No masses, trachea midline.   Respiratory: Normal respiratory effort. no wheeze, no rhonchi, no rales  Cardiovascular: S1/S2 normal, no murmur, no rub/gallop auscultated. RRR. No lower extremity edema.   Gastrointestinal: Nontender, no masses. No hepatomegaly, no splenomegaly. No hernia appreciated. Bowel sounds normal. Rectal exam deferred.   Musculoskeletal: Gait normal. No clubbing/cyanosis of digits.   Neurological: Normal balance/coordination. No tremor.  Skin: warm, dry, intact.   Psychiatric: Normal judgment/insight. Normal mood and affect. Oriented x3.   Results for orders placed or performed in visit on 09/21/16 (from the past 24 hour(s))  POCT HgB A1C     Status: Abnormal   Collection Time: 09/21/16  3:59 PM  Result Value Ref Range   Hemoglobin A1C 8.4     Depression screen [facility] 2/9 09/21/2016 09/20/2016  Decreased Interest  3 1  Down, Depressed, Hopeless 2 1  PHQ - 2 Score 5 2  Altered sleeping 3 1  Tired, decreased energy 3 2  Change in appetite 3 1  Feeling bad or failure about yourself  3 2  Trouble concentrating 2 0  Moving slowly or fidgety/restless 2 0  Suicidal thoughts 0 0  PHQ-9 Score 21 8  Difficult doing work/chores - Somewhat difficult      ASSESSMENT/PLAN:   Uncontrolled type 2 diabetes mellitus with hyperglycemia, without long-term current use of insulin (HCC) - Likely will need dual or triple therapy but will start with metformin alone in case of intolerance. Close follow-up with home blood sugar readings. ?steroid role - discussed at length natural history of diabetes illness, plan going forward for likely need for more than one medication but will start with metformin for now. All questions answered at this time. We will need to discuss at future visit: Ensure compliance with ACE inhibitor/ARB for blood pressure control, consider initiation of aspirin/statin, pneumonia vaccine - Plan: POCT HgB A1C, Lancets (FREESTYLE) lancets, glucose blood test strip  Essential hypertension - Blood pressure controlled on current medications,  Infection and inflammatory reaction due to internal orthopedic device, implant, and graft, subsequent encounter  S/P shoulder replacement, left  TOBACCO ABUSE   +depression screening - no SI/HI, patient reports significant stress about recent sugar issues, wife is concerned about some increased irritability problems. Will address this in more detail at subsequent visit      Visit summary with medication list and pertinent instructions was printed for patient to review. All questions at time of visit were answered - patient instructed to contact office with any additional concerns. ER/RTC precautions were reviewed with the patient. Follow-up plan: Return in about 4 weeks (around 10/19/2016) for recheck sugars - review home logs .  Note: Total time spent 45  minutes, greater than 50% of the visit was spent face-to-face counseling and coordinating care for the following: The primary encounter diagnosis was Uncontrolled type 2 diabetes mellitus with hyperglycemia, without long-term current use of insulin ([place]). Diagnoses of Essential hypertension, Infection and inflammatory reaction due to internal orthopedic device, implant, and graft, subsequent encounter, S/P shoulder replacement, left, and TOBACCO ABUSE were also pertinent to this visit.Marland Kitchen

## 2016-09-21 NOTE — Patient Instructions (Signed)
Diabetes Mellitus and Food It is important for you to manage your blood sugar (glucose) level. Your blood glucose level can be greatly affected by what you eat. Eating healthier foods in the appropriate amounts throughout the day at about the same time each day will help you control your blood glucose level. It can also help slow or prevent worsening of your diabetes mellitus. Healthy eating may even help you improve the level of your blood pressure and reach or maintain a healthy weight. General recommendations for healthful eating and cooking habits include:  Eating meals and snacks regularly. Avoid going long periods of time without eating to lose weight.  Eating a diet that consists mainly of plant-based foods, such as fruits, vegetables, nuts, legumes, and whole grains.  Using low-heat cooking methods, such as baking, instead of high-heat cooking methods, such as deep frying.  Work with your dietitian to make sure you understand how to use the Nutrition Facts information on food labels. How can food affect me? Carbohydrates Carbohydrates affect your blood glucose level more than any other type of food. Your dietitian will help you determine how many carbohydrates to eat at each meal and teach you how to count carbohydrates. Counting carbohydrates is important to keep your blood glucose at a healthy level, especially if you are using insulin or taking certain medicines for diabetes mellitus. Alcohol Alcohol can cause sudden decreases in blood glucose (hypoglycemia), especially if you use insulin or take certain medicines for diabetes mellitus. Hypoglycemia can be a life-threatening condition. Symptoms of hypoglycemia (sleepiness, dizziness, and disorientation) are similar to symptoms of having too much alcohol. If your health care provider has given you approval to drink alcohol, do so in moderation and use the following guidelines:  Women should not have more than one drink per day, and men  should not have more than two drinks per day. One drink is equal to: ? 12 oz of beer. ? 5 oz of wine. ? 1 oz of hard liquor.  Do not drink on an empty stomach.  Keep yourself hydrated. Have water, diet soda, or unsweetened iced tea.  Regular soda, juice, and other mixers might contain a lot of carbohydrates and should be counted.  What foods are not recommended? As you make food choices, it is important to remember that all foods are not the same. Some foods have fewer nutrients per serving than other foods, even though they might have the same number of calories or carbohydrates. It is difficult to get your body what it needs when you eat foods with fewer nutrients. Examples of foods that you should avoid that are high in calories and carbohydrates but low in nutrients include:  Trans fats (most processed foods list trans fats on the Nutrition Facts label).  Regular soda.  Juice.  Candy.  Sweets, such as cake, pie, doughnuts, and cookies.  Fried foods.  What foods can I eat? Eat nutrient-rich foods, which will nourish your body and keep you healthy. The food you should eat also will depend on several factors, including:  The calories you need.  The medicines you take.  Your weight.  Your blood glucose level.  Your blood pressure level.  Your cholesterol level.  You should eat a variety of foods, including:  Protein. ? Lean cuts of meat. ? Proteins low in saturated fats, such as fish, egg whites, and beans. Avoid processed meats.  Fruits and vegetables. ? Fruits and vegetables that may help control blood glucose levels, such as apples,   mangoes, and yams.  Dairy products. ? Choose fat-free or low-fat dairy products, such as milk, yogurt, and cheese.  Grains, bread, pasta, and rice. ? Choose whole grain products, such as multigrain bread, whole oats, and brown rice. These foods may help control blood pressure.  Fats. ? Foods containing healthful fats, such as  nuts, avocado, olive oil, canola oil, and fish.  Does everyone with diabetes mellitus have the same meal plan? Because every person with diabetes mellitus is different, there is not one meal plan that works for everyone. It is very important that you meet with a dietitian who will help you create a meal plan that is just right for you. This information is not intended to replace advice given to you by your health care provider. Make sure you discuss any questions you have with your health care provider. Document Released: 12/03/2004 Document Revised: 08/14/2015 Document Reviewed: 02/02/2013 Elsevier Interactive Patient Education  2017 [employer]  

## 2016-09-22 ENCOUNTER — Encounter: Payer: Self-pay | Admitting: Osteopathic Medicine

## 2016-09-24 DIAGNOSIS — J45909 Unspecified asthma, uncomplicated: Secondary | ICD-10-CM | POA: Diagnosis not present

## 2016-09-24 DIAGNOSIS — T8459XA Infection and inflammatory reaction due to other internal joint prosthesis, initial encounter: Secondary | ICD-10-CM | POA: Diagnosis not present

## 2016-09-24 DIAGNOSIS — Z452 Encounter for adjustment and management of vascular access device: Secondary | ICD-10-CM | POA: Diagnosis not present

## 2016-09-24 DIAGNOSIS — Z96612 Presence of left artificial shoulder joint: Secondary | ICD-10-CM | POA: Diagnosis not present

## 2016-09-24 DIAGNOSIS — I10 Essential (primary) hypertension: Secondary | ICD-10-CM | POA: Diagnosis not present

## 2016-09-24 DIAGNOSIS — J449 Chronic obstructive pulmonary disease, unspecified: Secondary | ICD-10-CM | POA: Diagnosis not present

## 2016-09-27 ENCOUNTER — Encounter: Payer: Self-pay | Admitting: Osteopathic Medicine

## 2016-09-29 ENCOUNTER — Telehealth: Payer: Self-pay | Admitting: Osteopathic Medicine

## 2016-09-29 MED ORDER — DAPAGLIFLOZIN PROPANEDIOL 5 MG PO TABS: 5.0000 mg | ORAL_TABLET | Freq: Every day | ORAL | 1 refills | Status: DC

## 2016-09-29 NOTE — Telephone Encounter (Signed)
Spoke with Pt, he is taking three tablets of Metformin 500mg  daily. He reports taking 2 tablets in the morning, 1 tablet at night. He still had the same symptoms when he was taking 2 tablets daily (1 in the morning, 1 in the evening). Pt has been on the medication since last OV, about 2 weeks.   Pt's blood sugars have been: 09/24/16- 271 (am) 09/27/16- 154 (am) 09/28/16- 160 (am) 09/29/16- 144 (am)  Routing back to PCP for review. Pt states he "doesn't like feeling this way." Would like to try something different if PCP would recommend.

## 2016-09-29 NOTE — Telephone Encounter (Signed)
Pt called clinic today stating he is having some adverse side effects from the metformin. Pt reports stomach pain, muscle cramping, extreme fatigue, and he feels "drunk." Pt reports at this time he is worried to even drive. Pt would like to speak with PCP, or get PCP's recommendation on if he should continue Rx. Routing for review.

## 2016-09-29 NOTE — Telephone Encounter (Signed)
Can stop metformin, alternative medication sent to pharmacy: Farxiga 5 mg daily, if he is tolerating this after 1-2 weeks, we can increase the dose to 10 mg

## 2016-09-29 NOTE — Telephone Encounter (Signed)
Pt advised. Verbalized understanding. No further questions at this time.

## 2016-09-29 NOTE — Telephone Encounter (Signed)
Did he recently increase dose or is he still on the one tablet per day (500 mg). Nausea and GI upset is very common when starting metformin and usually will go away. Headache, dizziness, muscle pain and weakness are rare side effects of metformin but it can happen. He can stop the medication and see if these symptoms go away, and I can send something else for him.

## 2016-10-01 NOTE — Telephone Encounter (Signed)
Paper work already completed. Spoke with pt states he's already picked up the medication with no delays.

## 2016-10-01 NOTE — Telephone Encounter (Signed)
Pt contacted clinic today stating the [facility] requires PA. Will route for status update. Pt would like to be contacted.

## 2016-10-05 DIAGNOSIS — Z471 Aftercare following joint replacement surgery: Secondary | ICD-10-CM | POA: Diagnosis not present

## 2016-10-05 DIAGNOSIS — Z96612 Presence of left artificial shoulder joint: Secondary | ICD-10-CM | POA: Diagnosis not present

## 2016-10-05 DIAGNOSIS — Z9889 Other specified postprocedural states: Secondary | ICD-10-CM | POA: Diagnosis not present

## 2016-10-06 ENCOUNTER — Telehealth: Payer: Self-pay | Admitting: Osteopathic Medicine

## 2016-10-06 MED ORDER — EMPAGLIFLOZIN 25 MG PO TABS: 25.0000 mg | ORAL_TABLET | Freq: Every day | ORAL | 2 refills | Status: DC

## 2016-10-06 NOTE — Telephone Encounter (Signed)
Received notification that Frederick Guzman was not covered so I sent alternative to the pharmacy on file - please let patient know. Thank you

## 2016-10-07 NOTE — Telephone Encounter (Signed)
Pt informed. He stated that his co pay will be $160 monthly.Audelia Hives [place]

## 2016-10-08 MED ORDER — GLIMEPIRIDE 2 MG PO TABS: 2.0000 mg | ORAL_TABLET | Freq: Every day | ORAL | 2 refills | Status: DC

## 2016-10-08 NOTE — Telephone Encounter (Signed)
Would encourage patient to contact his insurance company to see what is on their formulary for diabetes medications, asked about specific prices/co-pay. For now, I have sent in the only other inexpensive generic besides metformin. If any problems with this, let us know.

## 2016-10-08 NOTE — Addendum Note (Signed)
Addended by: Maryla Morrow on: 10/08/2016 02:14 PM   Modules accepted: Orders

## 2016-10-08 NOTE — Telephone Encounter (Signed)
Patient notified

## 2016-10-19 ENCOUNTER — Encounter: Payer: Self-pay | Admitting: Osteopathic Medicine

## 2016-10-19 ENCOUNTER — Ambulatory Visit (INDEPENDENT_AMBULATORY_CARE_PROVIDER_SITE_OTHER): Payer: Medicare Other | Admitting: Osteopathic Medicine

## 2016-10-19 VITALS — BP 106/61 | HR 94 | Ht 71.0 in | Wt 186.0 lb

## 2016-10-19 DIAGNOSIS — F419 Anxiety disorder, unspecified: Secondary | ICD-10-CM

## 2016-10-19 DIAGNOSIS — I10 Essential (primary) hypertension: Secondary | ICD-10-CM | POA: Diagnosis not present

## 2016-10-19 DIAGNOSIS — F418 Other specified anxiety disorders: Secondary | ICD-10-CM

## 2016-10-19 DIAGNOSIS — R7989 Other specified abnormal findings of blood chemistry: Secondary | ICD-10-CM | POA: Diagnosis not present

## 2016-10-19 DIAGNOSIS — F329 Major depressive disorder, single episode, unspecified: Secondary | ICD-10-CM | POA: Diagnosis not present

## 2016-10-19 DIAGNOSIS — F17200 Nicotine dependence, unspecified, uncomplicated: Secondary | ICD-10-CM | POA: Diagnosis not present

## 2016-10-19 DIAGNOSIS — E1165 Type 2 diabetes mellitus with hyperglycemia: Secondary | ICD-10-CM | POA: Diagnosis not present

## 2016-10-19 HISTORY — DX: Other specified anxiety disorders: F41.8

## 2016-10-19 MED ORDER — DAPAGLIFLOZIN PROPANEDIOL 5 MG PO TABS: 5.0000 mg | ORAL_TABLET | Freq: Every day | ORAL | 1 refills | Status: DC

## 2016-10-19 MED ORDER — VARENICLINE TARTRATE 1 MG PO TABS: 1.0000 mg | ORAL_TABLET | Freq: Two times a day (BID) | ORAL | 2 refills | Status: DC

## 2016-10-19 MED ORDER — VARENICLINE TARTRATE 0.5 MG X 11 & 1 MG X 42 PO MISC: ORAL | 0 refills | Status: DC

## 2016-10-19 NOTE — Progress Notes (Signed)
HPI: Frederick Guzman is a 66 y.o. male  who presents to [name] today, 10/19/16,  for chief complaint of:  Chief Complaint  Patient presents with  . Hyperglycemia   Long-standing history of elevated blood sugars but patient has been reluctant to initiate medication therapy. Recently seen in the office as new patient - we recommended mtformin but he was intolerant to this. Insurance issues and expensive medications have been an issue - settled on Glimepiride but he is not taking this, he is still on Farxiga .  Recent hospitalization for procedure to address infection joint prosthesis in shoulder. F/u in place w/ ortho and ID. PICC recently removed - ancef d/c and switched to Doxy.   Tobacco abuse is also an issue, patient would like to try Chantix. Has tried nicotine replacements and has been on Welbutrin in the past.   Depression/anxiety: Currently on Cymbalta for several myalgia/pain issues. Has been on other treatments before, has been quite anxious over recent health issues, would not like to try augmentation/alternative medications at this point  Is concerned his BP might be dropping too low. Occasional orthostatic symptoms. No LOC or falling.    DIABETES SCREENING/PREVENTIVE CARE: A1C past 3-6 mos: Yes  controlled? No   09/21/16 8.4% BP goal <130/80: Yes   09/21/16: 139/73  10/19/16: 106/61  LDL goal <70: unknown  Eye exam annually: not yet, importance discussed with patient Foot exam: will get at next visit Microalbuminuria: N/a on ACE/ARB Metformin: started 09/2016 ACE/ARB: Yes  Antiplatelet if ASCVD Risk >10%: will discuss next visit Statin: will discuss Pneumovax: will discuss     Past medical, surgical, social and family history reviewed: Patient Active Problem List   Diagnosis Date Noted  . Positive depression screening 09/21/2016  . Uncontrolled type 2 diabetes mellitus with hyperglycemia, without long-term current  use of insulin ([place]) 09/21/2016  . Type 2 diabetes mellitus without complications ([place]) 35/57/3220  . Status post shoulder surgery 08/09/2016  . Prosthetic shoulder infection, subsequent encounter 08/09/2016  . Infection and inflammatory reaction due to internal orthopedic device, implant, and graft ([place]) 03/31/2016  . S/P shoulder replacement, left 01/30/2016  . HCAP (healthcare-associated pneumonia) 07/13/2015  . CAP (community acquired pneumonia) 07/13/2015  . Depression   . HTN (hypertension)   . Asthma exacerbation   . MDD (major depressive disorder) ([place]) 04/30/2011  . GERD 11/23/2010  . DERMATITIS, ATOPIC 11/23/2010  . TOBACCO ABUSE 03/10/2007  . OBSTRUCTIVE SLEEP APNEA 03/10/2007  . BRONCHITIS, CHRONIC 03/10/2007  . ASTHMA, MODERATE 03/10/2007   Past Surgical History:  Procedure Laterality Date  . COLONOSCOPY W/ POLYPECTOMY    . COLONOSCOPY W/ POLYPECTOMY    . EXCISIONAL TOTAL SHOULDER ARTHROPLASTY WITH ANTIBIOTIC SPACER Left 08/09/2016   Procedure: LEFT SHOULDER HARDWARE REMOVAL, IRRIGATION AND DEBRIDEMENT, PLACEMENT OF ANTIBIOTIC SPACER;  Surgeon: Netta Cedars, MD;  Location: [facility];  Service: Orthopedics;  Laterality: Left;  . IRRIGATION AND DEBRIDEMENT SHOULDER Left 03/31/2016   Procedure: IRRIGATION AND DEBRIDEMENT SHOULDER, deep cultures and polyethylene exchange;  Surgeon: Netta Cedars, MD;  Location: [facility];  Service: Orthopedics;  Laterality: Left;  requests 21mins  . REVERSE SHOULDER ARTHROPLASTY Left 01/30/2016   Procedure: LEFT REVERSE SHOULDER ARTHROPLASTY;  Surgeon: Netta Cedars, MD;  Location: [facility];  Service: Orthopedics;  Laterality: Left;  . ROTATOR CUFF REPAIR Left   . WRIST SURGERY Right 11/2014   Dr. Amedeo Plenty   Social History  Substance Use Topics  . Smoking status: Current Every Day Smoker  Packs/day: 1.00    Years: 42.00  . Smokeless tobacco: Never Used     Comment: cutting back  . Alcohol use No     Comment: beer or mixed drink    Family History   Problem Relation Age of Onset  . Diabetes Mother   . Colon cancer Father   . Prostate cancer Father   . Diabetes Father      Current medication list and allergy/intolerance information reviewed:   Current Outpatient Prescriptions  Medication Sig Dispense Refill  . albuterol (PROAIR HFA) 108 (90 Base) MCG/ACT inhaler Inhale 2 puffs into the lungs every 6 (six) hours as needed for wheezing or shortness of breath.    Marland Kitchen aspirin EC 81 MG tablet Take 81 mg by mouth at bedtime.    Marland Kitchen atorvastatin (LIPITOR) 20 MG tablet Take 20 mg by mouth every other day.   0  . b complex vitamins tablet Take 1 tablet by mouth daily.    . budesonide-formoterol (SYMBICORT) 160-4.5 MCG/ACT inhaler Inhale 2 puffs into the lungs 2 (two) times daily.    . cetirizine (ZYRTEC) 10 MG tablet Take 10 mg by mouth daily.    Marland Kitchen doxycycline (VIBRA-TABS) 100 MG tablet Take 1 tablet (100 mg total) by mouth 2 (two) times daily. 20 tablet 0  . DULoxetine (CYMBALTA) 60 MG capsule Take 60 mg by mouth daily.    Marland Kitchen esomeprazole (NEXIUM) 20 MG capsule Take 20 mg by mouth daily.     . finasteride (PROSCAR) 5 MG tablet Take 5 mg by mouth daily.     . fluticasone (FLONASE) 50 MCG/ACT nasal spray Place 1 spray into both nostrils daily as needed for allergies.  1  . glimepiride (AMARYL) 2 MG tablet Take 1 tablet (2 mg total) by mouth daily before breakfast. (Can start with one half tablet daily for 1 week) 30 tablet 2  . glucose blood test strip Use up to 4 times per day as directed with glucometer. Disp: 100. Refill x99 100 each 99  . ipratropium-albuterol (DUONEB) 0.5-2.5 (3) MG/3ML SOLN Take 3 mLs by nebulization every 6 (six) hours as needed. 360 mL 1  . Lancets (FREESTYLE) lancets Use as instructed up to tid 100 each 99  . lisinopril (PRINIVIL,ZESTRIL) 5 MG tablet Take 5 mg by mouth at bedtime.     . Multiple Vitamin (MULTIVITAMIN WITH MINERALS) TABS tablet Take 1 tablet by mouth at bedtime.    . Omega-3 Fatty Acids (FISH OIL) 1000 MG  CAPS Take 1,000 mg by mouth at bedtime.     Marland Kitchen oxyCODONE-acetaminophen (PERCOCET) 7.5-325 MG tablet Take 1-2 tablets by mouth every 4 (four) hours as needed for severe pain. (Patient not taking: Reported on 09/20/2016) 40 tablet 0  . sildenafil (REVATIO) 20 MG tablet TAKE 2-5 TABLETS AS NEEDED  3  . Tamsulosin HCl (FLOMAX) 0.4 MG CAPS Take 0.4 mg by mouth 2 (two) times daily.     . Testosterone (AXIRON) 30 MG/ACT SOLN Place 60 mg onto the skin daily. 1 pump under each arm daily = 2 pumps daily    . vitamin C (ASCORBIC ACID) 500 MG tablet Take 500 mg by mouth daily.     No current facility-administered medications for this visit.    Allergies  Allergen Reactions  . Methotrexate Derivatives Nausea And Vomiting and Other (See Comments)    Mood change  . Ultram [Tramadol Hcl] Nausea And Vomiting      Review of Systems:  Constitutional:  No  fever, no  chills, No unintentional weight changes. No significant fatigue.   HEENT: No  headache, no vision change  Cardiac: No  chest pain, No  pressure  Respiratory:  No  shortness of breath.   Gastrointestinal: No  abdominal pain, No  nausea  Musculoskeletal: No new myalgia/arthralgia  Skin: No  Rash  Hem/Onc: No  easy bruising/bleeding  Psychiatric: No  concerns with depression, +concerns with anxiety, No sleep problems, No mood problems  Exam:  BP 106/61   Pulse 94   Ht 5\' 11"  (1.803 m)   Wt 186 lb (84.4 kg)   BMI 25.94 kg/m   Constitutional: VS see above. General Appearance: alert, well-developed, well-nourished, NAD  Ears, Nose, Mouth, Throat: MMM, Normal external inspection ears/nares/mouth/lips/gums.   Neck: No masses, trachea midline.   Respiratory: Normal respiratory effort. no wheeze, no rhonchi, no rales  Cardiovascular: S1/S2 normal, no murmur, no rub/gallop auscultated. RRR. No lower extremity edema.   Musculoskeletal: Gait normal. No clubbing/cyanosis of digits.   Neurological: Normal balance/coordination.  Skin:  warm, dry, intact.   Psychiatric: Normal judgment/insight. Normal mood and affect. Oriented x3.     ASSESSMENT/PLAN: The primary encounter diagnosis was Uncontrolled type 2 diabetes mellitus with hyperglycemia, without long-term current use of insulin ([place]). Diagnoses of TOBACCO ABUSE, Low testosterone, Essential hypertension, and Anxious depression were also pertinent to this visit.  Patient Instructions  Plna: Stay on Farxiga if able to do that, if not, switch to Glimepiride  Can take 1/2 tablet of Lisinopril  Will try Chantix for smoking  See me in 2 months to recheck A1C   Leave Glimepiride on the list for now, start this in case Wilder Glade is crazy expensive second month.     Visit summary with medication list and pertinent instructions was printed for patient to review. All questions at time of visit were answered - patient instructed to contact office with any additional concerns. ER/RTC precautions were reviewed with the patient. Follow-up plan: Return in about 2 months (around 12/19/2016) for recheck  A1C .  Note: Total time spent 25 minutes, greater than 50% of the visit was spent face-to-face counseling and coordinating care for the following: The primary encounter diagnosis was Uncontrolled type 2 diabetes mellitus with hyperglycemia, without long-term current use of insulin ([place]). Diagnoses of TOBACCO ABUSE, Low testosterone, and Essential hypertension were also pertinent to this visit.Marland Kitchen

## 2016-10-19 NOTE — Patient Instructions (Addendum)
Plna: Stay on Farxiga if able to do that, if not, switch to Glimepiride  Can take 1/2 tablet of Lisinopril  Will try Chantix for smoking  See me in 2 months to recheck A1C

## 2016-10-20 ENCOUNTER — Other Ambulatory Visit: Payer: Self-pay | Admitting: Osteopathic Medicine

## 2016-10-20 LAB — COMPLETE METABOLIC PANEL WITH GFR
ALK PHOS: 113 U/L (ref 40–115)
ALT: 20 U/L (ref 9–46)
AST: 19 U/L (ref 10–35)
Albumin: 4.1 g/dL (ref 3.6–5.1)
BUN: 13 mg/dL (ref 7–25)
CHLORIDE: 100 mmol/L (ref 98–110)
CO2: 22 mmol/L (ref 20–31)
Calcium: 9.3 mg/dL (ref 8.6–10.3)
Creat: 1.02 mg/dL (ref 0.70–1.25)
GFR, EST NON AFRICAN AMERICAN: 76 mL/min (ref >=60)
GFR, Est African American: 88 mL/min (ref >=60)
GLUCOSE: 176 mg/dL — AB (ref 65–99)
POTASSIUM: 4.4 mmol/L (ref 3.5–5.3)
SODIUM: 137 mmol/L (ref 135–146)
Total Bilirubin: 0.5 mg/dL (ref 0.2–1.2)
Total Protein: 6.6 g/dL (ref 6.1–8.1)

## 2016-10-20 LAB — TESTOSTERONE: Testosterone: 1045 ng/dL — ABNORMAL HIGH (ref 250–827)

## 2016-10-20 MED ORDER — TESTOSTERONE 30 MG/ACT TD SOLN: TRANSDERMAL | 1 refills | Status: DC

## 2016-10-20 NOTE — Progress Notes (Signed)
Reduced dose of testosterone

## 2016-11-01 ENCOUNTER — Encounter: Payer: Self-pay | Admitting: Infectious Diseases

## 2016-11-01 ENCOUNTER — Ambulatory Visit (INDEPENDENT_AMBULATORY_CARE_PROVIDER_SITE_OTHER): Payer: Medicare Other | Admitting: Infectious Diseases

## 2016-11-01 DIAGNOSIS — Z96619 Presence of unspecified artificial shoulder joint: Secondary | ICD-10-CM

## 2016-11-01 DIAGNOSIS — E1165 Type 2 diabetes mellitus with hyperglycemia: Secondary | ICD-10-CM | POA: Diagnosis not present

## 2016-11-01 DIAGNOSIS — T8459XD Infection and inflammatory reaction due to other internal joint prosthesis, subsequent encounter: Secondary | ICD-10-CM

## 2016-11-01 MED ORDER — DOXYCYCLINE HYCLATE 100 MG PO TABS: 100.0000 mg | ORAL_TABLET | Freq: Two times a day (BID) | ORAL | 3 refills | Status: DC

## 2016-11-01 NOTE — Assessment & Plan Note (Signed)
Improving.  Appreciate PCP f/u.

## 2016-11-01 NOTE — Progress Notes (Signed)
   Subjective:    Patient ID: Frederick Guzman, male    DOB: [birth year], 66 y.o.   MRN: [id number]  HPI 66 yo M with hx of L shoulder arthroplasty 01-30-16, subsequent I &D due to infection on 04-01-16.  His Cx grew MSSE and P acnes.  He was treated with amp-rifampin.  He returned on 08-09-16 and underwent resection of his prosthesis. His repeat Cx were (-). He was d/c home on ancef.  He is anticipating a 2 stage procedure.       09-17-16            09-10-16            09-03-16              [id number] ESR     _0 [id number] CRP     6.5                   3.2                   15.4                     [phone]   He was seen in ID clinic on 7-2 and his IV anbx were stopped and he was started on po doxy.  He was also noted to have increased Glc, assumed to prednisone. He was to f/u with IM in [facility].  He had A1C of 8.4% on 7-31. On metformin --> amaryl.   He stopped the doxy when he ran out of refills.  He has f/u appt with ortho on 8-22.  Feels better- "blood sugar straightened out. Energy level is better". No as many dizzy episodes.   Review of Systems  Constitutional: Negative for appetite change and unexpected weight change.  Gastrointestinal: Negative for constipation and diarrhea.  Endocrine: Negative for polyuria.  Genitourinary: Negative for difficulty urinating.  Musculoskeletal: Positive for arthralgias.  Neurological: Negative for dizziness.  has had muscle stiffness in his L neck, some relief with flexeril.     Objective:   Physical Exam  Constitutional: He appears well-developed and well-nourished.  HENT:  Mouth/Throat: No oropharyngeal exudate.  Eyes: Pupils are equal, round, and reactive to light. EOM are normal.  Neck: Neck supple.  Cardiovascular: Normal rate, regular rhythm and normal heart sounds.   Pulmonary/Chest: Effort normal  and breath sounds normal.  Abdominal: Soft. Bowel sounds are normal. There is no tenderness. There is no rebound.  Musculoskeletal: He exhibits no edema.       Arms: Lymphadenopathy:    He has no cervical adenopathy.       Assessment & Plan:

## 2016-11-01 NOTE — Assessment & Plan Note (Signed)
Will restart his doxy Check his ESR and CRP (will be interesting since off anbx) Has f/u with ortho.  Will see him back in 2 months

## 2016-11-02 LAB — C-REACTIVE PROTEIN: CRP: 9.9 mg/L — AB (ref <8.0)

## 2016-11-02 LAB — SEDIMENTATION RATE: Sed Rate: 11 mm/hr (ref 0–20)

## 2016-11-10 DIAGNOSIS — Z4789 Encounter for other orthopedic aftercare: Secondary | ICD-10-CM | POA: Diagnosis not present

## 2016-11-15 DIAGNOSIS — G4733 Obstructive sleep apnea (adult) (pediatric): Secondary | ICD-10-CM | POA: Diagnosis not present

## 2016-11-16 DIAGNOSIS — J449 Chronic obstructive pulmonary disease, unspecified: Secondary | ICD-10-CM | POA: Diagnosis not present

## 2016-11-16 DIAGNOSIS — I10 Essential (primary) hypertension: Secondary | ICD-10-CM | POA: Diagnosis not present

## 2016-11-16 DIAGNOSIS — G4733 Obstructive sleep apnea (adult) (pediatric): Secondary | ICD-10-CM | POA: Diagnosis not present

## 2016-11-16 DIAGNOSIS — F17210 Nicotine dependence, cigarettes, uncomplicated: Secondary | ICD-10-CM | POA: Diagnosis not present

## 2016-11-25 DIAGNOSIS — F17210 Nicotine dependence, cigarettes, uncomplicated: Secondary | ICD-10-CM | POA: Diagnosis not present

## 2016-11-25 DIAGNOSIS — Z87891 Personal history of nicotine dependence: Secondary | ICD-10-CM | POA: Diagnosis not present

## 2016-12-01 DIAGNOSIS — G8929 Other chronic pain: Secondary | ICD-10-CM | POA: Diagnosis not present

## 2016-12-01 DIAGNOSIS — M25531 Pain in right wrist: Secondary | ICD-10-CM | POA: Diagnosis not present

## 2016-12-01 DIAGNOSIS — M19031 Primary osteoarthritis, right wrist: Secondary | ICD-10-CM | POA: Diagnosis not present

## 2016-12-01 DIAGNOSIS — M79641 Pain in right hand: Secondary | ICD-10-CM | POA: Diagnosis not present

## 2016-12-01 DIAGNOSIS — R2231 Localized swelling, mass and lump, right upper limb: Secondary | ICD-10-CM | POA: Diagnosis not present

## 2016-12-06 ENCOUNTER — Other Ambulatory Visit: Payer: Self-pay

## 2016-12-06 MED ORDER — DULOXETINE HCL 60 MG PO CPEP: 60.0000 mg | ORAL_CAPSULE | Freq: Every day | ORAL | 0 refills | Status: DC

## 2016-12-06 MED ORDER — DULOXETINE HCL 30 MG PO CPEP: 30.0000 mg | ORAL_CAPSULE | Freq: Every day | ORAL | 0 refills | Status: DC

## 2016-12-06 NOTE — Telephone Encounter (Signed)
Patient request a refill for Cymbalta 30 mg and Cymbalta 60 mg # 90 0 refills sent to [facility]. Rhonda Cunningham,CMA

## 2016-12-15 ENCOUNTER — Ambulatory Visit (INDEPENDENT_AMBULATORY_CARE_PROVIDER_SITE_OTHER): Payer: Medicare Other | Admitting: Osteopathic Medicine

## 2016-12-15 ENCOUNTER — Encounter: Payer: Self-pay | Admitting: Osteopathic Medicine

## 2016-12-15 VITALS — BP 135/79 | HR 89 | Ht 72.0 in | Wt 190.0 lb

## 2016-12-15 DIAGNOSIS — I10 Essential (primary) hypertension: Secondary | ICD-10-CM

## 2016-12-15 DIAGNOSIS — E119 Type 2 diabetes mellitus without complications: Secondary | ICD-10-CM

## 2016-12-15 DIAGNOSIS — R7989 Other specified abnormal findings of blood chemistry: Secondary | ICD-10-CM

## 2016-12-15 DIAGNOSIS — Z23 Encounter for immunization: Secondary | ICD-10-CM

## 2016-12-15 LAB — POCT GLYCOSYLATED HEMOGLOBIN (HGB A1C): HEMOGLOBIN A1C: 7

## 2016-12-15 MED ORDER — ZOSTER VAC RECOMB ADJUVANTED 50 MCG/0.5ML IM SUSR: 0.5000 mL | Freq: Once | INTRAMUSCULAR | 1 refills | Status: DC

## 2016-12-15 MED ORDER — BUPROPION HCL ER (XL) 150 MG PO TB24: 150.0000 mg | ORAL_TABLET | Freq: Every day | ORAL | 1 refills | Status: DC

## 2016-12-15 MED ORDER — ZOSTER VAC RECOMB ADJUVANTED 50 MCG/0.5ML IM SUSR: 0.5000 mL | Freq: Once | INTRAMUSCULAR | 1 refills | Status: AC

## 2016-12-15 MED ORDER — LISINOPRIL 2.5 MG PO TABS: 2.5000 mg | ORAL_TABLET | Freq: Every day | ORAL | 3 refills | Status: DC

## 2016-12-15 NOTE — Patient Instructions (Addendum)
Plan:  A1C looks better! Keep up the good work with diet/exercise as you're able   Quitting smoking is going to be key  Will check testosterone levels and cholesterol today   Will start Wellbutrin and see how that does for mood help

## 2016-12-15 NOTE — Progress Notes (Signed)
HPI: Frederick Guzman is a 66 y.o. male  who presents to [facility] today, 12/15/16,  for chief complaint of:  Chief Complaint  Patient presents with  . Follow-up    Diabetes   Long-standing history of elevated blood sugars but patient had been reluctant to be on medication therapy. Initially at new patient visit I recommended metformin but he was intolerant to this. Insurance issues and expensive medications have been a barrier - we had settled on Glimepiride only at this point.   Underwent hospitalization 07/2016 for procedure to address infection joint prosthesis in shoulder. Following with infectious disease - Another surgery on his shoulder is planned for later next month  Tobacco abuse is also an issue, patient was prescribed Chantix. Has tried nicotine replacements and has been on Welbutrin in the past. He has been somewhat successful with cutting back on cigarettes since starting the Chantix but still has not been able to quit entirely  Depression/anxiety: Currently on Cymbalta for several myalgia/pain issues. Has been on other treatments before, has been quite anxious over recent health issues, wife has noticed he is a lot more irritable, would like to try augmentation/alternative medications at this point  At last visit, was concerned his BP might be dropping too low. Occasional orthostatic symptoms. No LOC or falling. Decided to go down to 1/2 dose Lisinopril. He is currently doing well on 2.5 mg   DIABETES SCREENING/PREVENTIVE CARE: A1C past 3-6 mos: Yes  controlled? No   09/21/16 8.4% --> stay on just Glimepiride due to intolerance to metformin, reluctant to take Wilder Glade due to cost, financial issues getting medications.   12/15/16: 7.0%  BP goal <130/80: Yes/close    09/21/16: 139/73  10/19/16: 106/61   12/15/16: 135/79 LDL goal <70: unknown - need labs  Eye exam annually: not yet, importance discussed with patient Foot exam:  will get at next visit Microalbuminuria: N/a on ACE/ARB Metformin: started 09/2016 but intolerant  ACE/ARB: Yes  Antiplatelet if ASCVD Risk >10% - he is taking low dose ASA  Statin: Taking Pneumovax: discuss - due for second one after age 47    Past medical, surgical, social and family history reviewed: Patient Active Problem List   Diagnosis Date Noted  . 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 Positive depression screening 09/21/2016  . Uncontrolled type 2 diabetes mellitus with hyperglycemia, without long-term current use of insulin ([place]) 09/21/2016  . Type 2 diabetes mellitus without complications ([place]) 97/67/3419  . Status post shoulder surgery 08/09/2016  . Prosthetic shoulder infection, subsequent encounter 08/09/2016  . Infection and inflammatory reaction due to internal orthopedic device, implant, and graft ([place]) 03/31/2016  . S/P shoulder replacement, left 01/30/2016  . HCAP (healthcare-associated pneumonia) 07/13/2015  . CAP (community acquired pneumonia) 07/13/2015  . Depression   . HTN (hypertension)   . Asthma exacerbation   . MDD (major depressive disorder) ([place]) 04/30/2011  . GERD 11/23/2010  . DERMATITIS, ATOPIC 11/23/2010  . TOBACCO ABUSE 03/10/2007  . OBSTRUCTIVE SLEEP APNEA 03/10/2007  . BRONCHITIS, CHRONIC 03/10/2007  . ASTHMA, MODERATE 03/10/2007   Past Surgical History:  Procedure Laterality Date  . COLONOSCOPY W/ POLYPECTOMY    . COLONOSCOPY W/ POLYPECTOMY    . EXCISIONAL TOTAL SHOULDER ARTHROPLASTY WITH ANTIBIOTIC SPACER Left 08/09/2016   Procedure: LEFT SHOULDER HARDWARE REMOVAL, IRRIGATION AND DEBRIDEMENT, PLACEMENT OF ANTIBIOTIC SPACER;  Surgeon: Netta Cedars, MD;  Location: [facility];  Service: Orthopedics;  Laterality: Left;  . IRRIGATION AND DEBRIDEMENT SHOULDER Left 03/31/2016  Procedure: IRRIGATION AND DEBRIDEMENT SHOULDER, deep cultures and polyethylene exchange;  Surgeon: Netta Cedars, MD;  Location: [facility];  Service: Orthopedics;  Laterality: Left;   requests 30mins  . REVERSE SHOULDER ARTHROPLASTY Left 01/30/2016   Procedure: LEFT REVERSE SHOULDER ARTHROPLASTY;  Surgeon: Netta Cedars, MD;  Location: [facility];  Service: Orthopedics;  Laterality: Left;  . ROTATOR CUFF REPAIR Left   . WRIST SURGERY Right 11/2014   Dr. Amedeo Plenty   Social History  Substance Use Topics  . Smoking status: Current Every Day Smoker    Packs/day: 1.00    Years: 42.00  . Smokeless tobacco: Never Used     Comment: cutting back  . Alcohol use No     Comment: beer or mixed drink    Family History  Problem Relation Age of Onset  . Diabetes Mother   . Colon cancer Father   . Prostate cancer Father   . Diabetes Father      Current medication list and allergy/intolerance information reviewed:   Current Outpatient Prescriptions  Medication Sig Dispense Refill  . albuterol (PROAIR HFA) 108 (90 Base) MCG/ACT inhaler Inhale 2 puffs into the lungs every 6 (six) hours as needed for wheezing or shortness of breath.    Marland Kitchen aspirin EC 81 MG tablet Take 81 mg by mouth at bedtime.    Marland Kitchen atorvastatin (LIPITOR) 20 MG tablet Take 20 mg by mouth every other day.   0  . b complex vitamins tablet Take 1 tablet by mouth daily.    . budesonide-formoterol (SYMBICORT) 160-4.5 MCG/ACT inhaler Inhale 2 puffs into the lungs 2 (two) times daily.    . cetirizine (ZYRTEC) 10 MG tablet Take 10 mg by mouth daily.    . dapagliflozin propanediol (FARXIGA) 5 MG TABS tablet Take 5 mg by mouth daily. 90 tablet 1  . doxycycline (VIBRA-TABS) 100 MG tablet Take 1 tablet (100 mg total) by mouth 2 (two) times daily. 120 tablet 3  . DULoxetine (CYMBALTA) 30 MG capsule Take 1 capsule (30 mg total) by mouth daily. 90 capsule 0  . DULoxetine (CYMBALTA) 60 MG capsule Take 1 capsule (60 mg total) by mouth daily. 90 capsule 0  . esomeprazole (NEXIUM) 20 MG capsule Take 20 mg by mouth daily.     . finasteride (PROSCAR) 5 MG tablet Take 5 mg by mouth daily.     . fluticasone (FLONASE) 50 MCG/ACT nasal spray  Place 1 spray into both nostrils daily as needed for allergies.  1  . glimepiride (AMARYL) 2 MG tablet Take 1 tablet (2 mg total) by mouth daily before breakfast. (Can start with one half tablet daily for 1 week) 30 tablet 2  . glucose blood test strip Use up to 4 times per day as directed with glucometer. Disp: 100. Refill x99 100 each 99  . ipratropium-albuterol (DUONEB) 0.5-2.5 (3) MG/3ML SOLN Take 3 mLs by nebulization every 6 (six) hours as needed. 360 mL 1  . Lancets (FREESTYLE) lancets Use as instructed up to tid 100 each 99  . lisinopril (PRINIVIL,ZESTRIL) 5 MG tablet Take 5 mg by mouth at bedtime.     . Multiple Vitamin (MULTIVITAMIN WITH MINERALS) TABS tablet Take 1 tablet by mouth at bedtime.    . Omega-3 Fatty Acids (FISH OIL) 1000 MG CAPS Take 1,000 mg by mouth at bedtime.     Marland Kitchen oxyCODONE-acetaminophen (PERCOCET) 7.5-325 MG tablet Take 1-2 tablets by mouth every 4 (four) hours as needed for severe pain. 40 tablet 0  . sildenafil (REVATIO)  20 MG tablet TAKE 2-5 TABLETS AS NEEDED  3  . Tamsulosin HCl (FLOMAX) 0.4 MG CAPS Take 0.4 mg by mouth 2 (two) times daily.     . Testosterone (AXIRON) 30 MG/ACT SOLN Alternate every other day, one pump under one arm and then one pump under both arms 90 mL 1  . varenicline (CHANTIX CONTINUING MONTH PAK) 1 MG tablet Take 1 tablet (1 mg total) by mouth 2 (two) times daily. 60 tablet 2  . varenicline (CHANTIX STARTING MONTH PAK) 0.5 MG X 11 & 1 MG X 42 tablet Take one 0.5 mg tablet by mouth once daily for 3 days, then increase to one 0.5 mg tablet twice daily for 4 days, then increase to one 1 mg tablet twice daily. 42 tablet 0  . vitamin C (ASCORBIC ACID) 500 MG tablet Take 500 mg by mouth daily.     No current facility-administered medications for this visit.    Allergies  Allergen Reactions  . Methotrexate Derivatives Nausea And Vomiting and Other (See Comments)    Mood change  . Ultram [Tramadol Hcl] Nausea And Vomiting      Review of  Systems:  Constitutional:  No  fever, no chills, No unintentional weight changes. No significant fatigue.   HEENT: No  headache, no vision change  Cardiac: No  chest pain, No  pressure  Respiratory:  No  shortness of breath.   Gastrointestinal: No  abdominal pain, No  nausea  Musculoskeletal: No new myalgia/arthralgia  Skin: No  Rash  Hem/Onc: No  easy bruising/bleeding  Psychiatric: No  concerns with depression, +concerns with anxiety, No sleep problems, No mood problems  Exam:  BP 135/79   Pulse 89   Ht 6' (1.829 m)   Wt 190 lb (86.2 kg)   BMI 25.77 kg/m   Constitutional: VS see above. General Appearance: alert, well-developed, well-nourished, NAD  Ears, Nose, Mouth, Throat: MMM, Normal external inspection ears/nares/mouth/lips/gums.   Neck: No masses, trachea midline.   Respiratory: Normal respiratory effort. no wheeze, no rhonchi, no rales  Cardiovascular: S1/S2 normal, no murmur, no rub/gallop auscultated. RRR. No lower extremity edema.   Musculoskeletal: Gait normal. No clubbing/cyanosis of digits.   Neurological: Normal balance/coordination.  Skin: warm, dry, intact.   Psychiatric: Normal judgment/insight. Normal mood and affect. Oriented x3.     ASSESSMENT/PLAN: The primary encounter diagnosis was Diabetes mellitus without complication ([place]). Diagnoses of Need for immunization against influenza, Essential hypertension, and Low testosterone in male were also pertinent to this visit.    Patient Instructions  Plan:  A1C looks better! Keep up the good work with diet/exercise as you're able   Quitting smoking is going to be key  Will check testosterone levels and cholesterol today   Will start Wellbutrin and see how that does for mood help   Orders Placed This Encounter  Procedures  . Flu vaccine HIGH DOSE PF  . Pneumococcal polysaccharide vaccine 23-valent greater than or equal to 2yo subcutaneous/IM  . Testosterone  . Lipid panel  . POCT HgB  A1C      Immunization History  Administered Date(s) Administered  . Influenza, High Dose Seasonal PF 12/15/2016  . Influenza, Seasonal, Injecte, Preservative Fre 11/27/2015  . Influenza-Unspecified 02/14/2012, 12/27/2012  . Pneumococcal Conjugate-13 03/18/2015  . Pneumococcal Polysaccharide-23 01/19/2008, 12/15/2016  . Tdap 05/31/2011     Visit summary with medication list and pertinent instructions was printed for patient to review. All questions at time of visit were answered - patient instructed to contact  office with any additional concerns. ER/RTC precautions were reviewed with the patient. Follow-up plan: Return in about 3 months (around 03/16/2017) for recheck sugars, sooner if needed  .  Note: Total time spent 25 minutes, greater than 50% of the visit was spent face-to-face counseling and coordinating care for the following: The primary encounter diagnosis was Diabetes mellitus without complication ([place]). Diagnoses of Need for immunization against influenza, Essential hypertension, and Low testosterone in male were also pertinent to this visit.Marland Kitchen

## 2016-12-16 ENCOUNTER — Other Ambulatory Visit: Payer: Self-pay | Admitting: Osteopathic Medicine

## 2016-12-16 LAB — LIPID PANEL
Cholesterol: 136 mg/dL (ref <200)
HDL: 47 mg/dL (ref >40)
LDL CHOLESTEROL (CALC): 74 mg/dL
NON-HDL CHOLESTEROL (CALC): 89 mg/dL (ref <130)
Total CHOL/HDL Ratio: 2.9 (calc) (ref <5.0)
Triglycerides: 73 mg/dL (ref <150)

## 2016-12-16 LAB — TESTOSTERONE: Testosterone: 822 ng/dL (ref 250–827)

## 2016-12-17 ENCOUNTER — Other Ambulatory Visit: Payer: Self-pay

## 2016-12-17 MED ORDER — ATORVASTATIN CALCIUM 20 MG PO TABS: 20.0000 mg | ORAL_TABLET | ORAL | 1 refills | Status: DC

## 2016-12-17 NOTE — Telephone Encounter (Signed)
Patient request a 90 day supply of Atorvastatin 20 mg. #90 1 refill sent to [facility]. Rhonda Cunningham,CMA

## 2016-12-23 DIAGNOSIS — M12812 Other specific arthropathies, not elsewhere classified, left shoulder: Secondary | ICD-10-CM | POA: Diagnosis not present

## 2016-12-23 DIAGNOSIS — Z96612 Presence of left artificial shoulder joint: Secondary | ICD-10-CM | POA: Diagnosis not present

## 2016-12-24 ENCOUNTER — Other Ambulatory Visit: Payer: Self-pay | Admitting: Orthopedic Surgery

## 2016-12-24 DIAGNOSIS — Z9889 Other specified postprocedural states: Secondary | ICD-10-CM

## 2016-12-30 ENCOUNTER — Ambulatory Visit
Admission: RE | Admit: 2016-12-30 | Discharge: 2016-12-30 | Disposition: A | Discharge status: Home / Self Care | Payer: Medicare Other | Source: Ambulatory Visit | Attending: Orthopedic Surgery | Admitting: Orthopedic Surgery

## 2016-12-30 DIAGNOSIS — R937 Abnormal findings on diagnostic imaging of other parts of musculoskeletal system: Secondary | ICD-10-CM | POA: Diagnosis not present

## 2016-12-30 DIAGNOSIS — Z9889 Other specified postprocedural states: Secondary | ICD-10-CM

## 2016-12-30 DIAGNOSIS — M25512 Pain in left shoulder: Secondary | ICD-10-CM | POA: Diagnosis not present

## 2016-12-30 IMAGING — CT CT 3D INDEPENDENT WKST
3 series · 8 of 14 positions shown, 9 images · non-contrast
Comparison: CT left shoulder [DATE] .

CLINICAL DATA: Left shoulder pain and decreased range of motion.
The patient status post shoulder placement. Hardware was removed due
to infection [DATE] with placement of an antibiotic impregnated
spacer.

EXAM:
CT OF THE UPPER LEFT EXTREMITY WITHOUT CONTRAST
TECHNIQUE: Multidetector CT imaging of the upper left extremity was performed
according to the standard protocol.

[Series 5: shoulder soft/left · axial · 0.58mm/px · z∈[-184,-102]mm · 2 of 99 slices shown]
[im 33/99  soft-tissue]
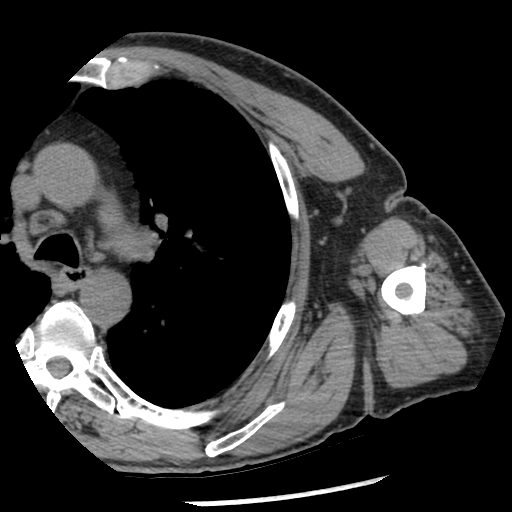
[im 66/99  soft-tissue]
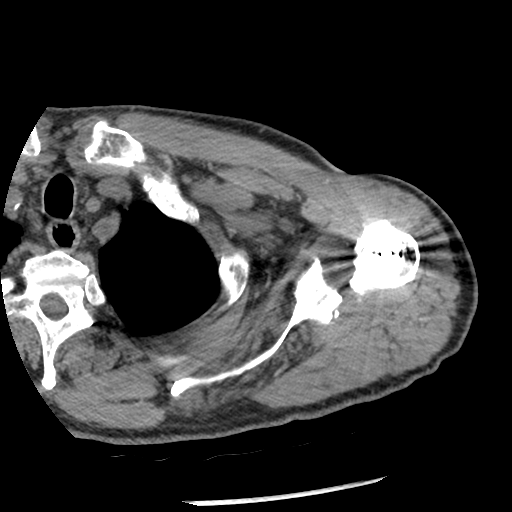

[Series 300: cor soft · coronal · 0.58mm/px · 2 of 97 slices shown]
[im 33/97  soft-tissue]
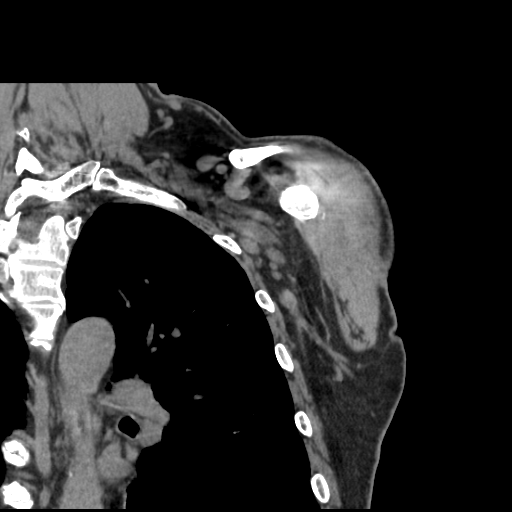
[im 65/97  soft-tissue]
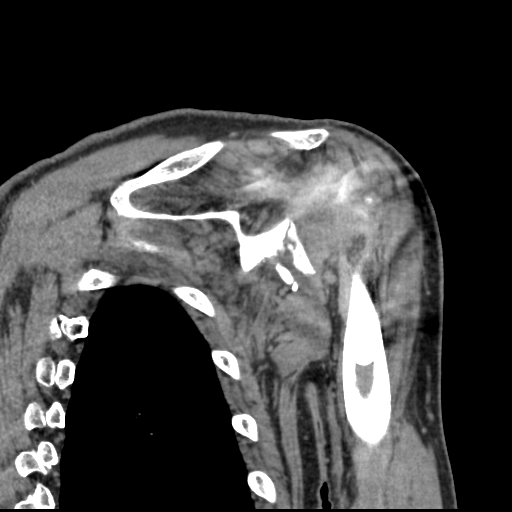

[Series 301: sagsoft · sagittal · 0.58mm/px · 4 of 127 slices shown, 5 images]
[im 26/127  soft-tissue]
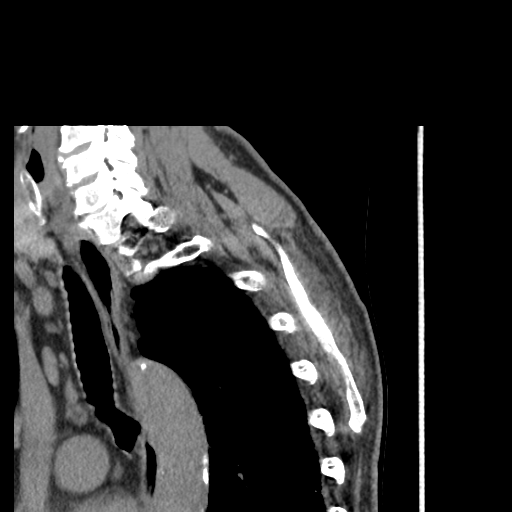
[im 26/127  bone]
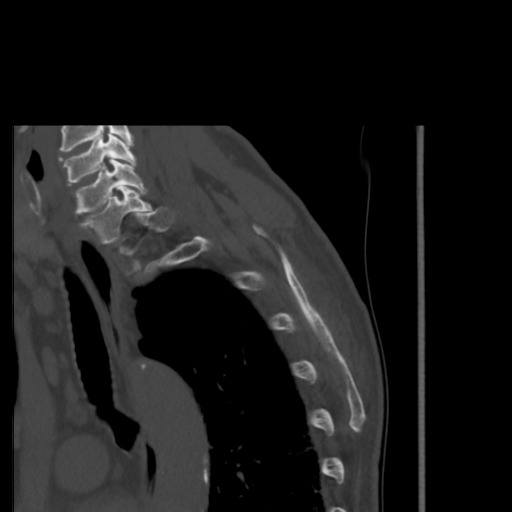
[im 51/127  bone]
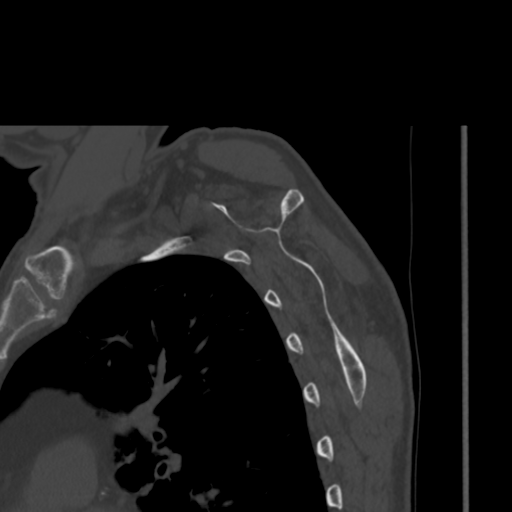
[im 76/127  bone]
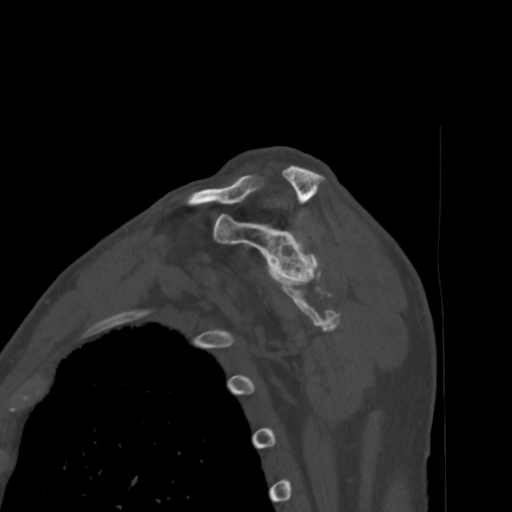
[im 101/127  bone]
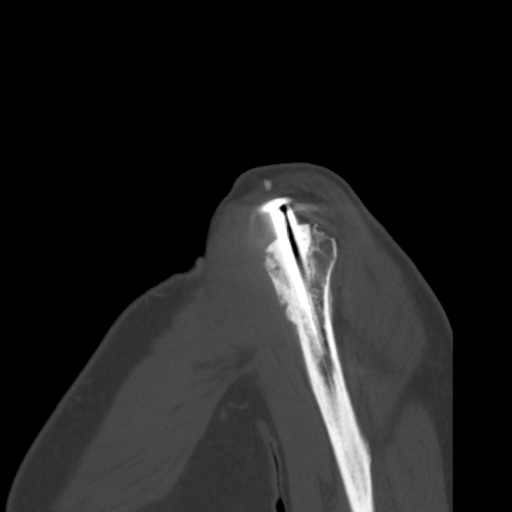

[8 of 14 positions shown; findings below may reference images not displayed]

FINDINGS: Bones/Joint/Cartilage

Left shoulder arthroplasty in place in the prior examination has
been removed with a new antibiotic impregnated spacer in place in
the humerus. Periosteal reaction of prior the diaphysis of the
humerus appears matured and corticated. There has been no
progression of periostitis.

Previously seen bone fragment off the inferior neck of the glenoid
has been debulked. Central portion of the fragment has been removed.
On today's study, there is an anterior component of the fragment
measures 3.7 cm craniocaudal by 0.5 cm AP x 2.1 cm transverse. The
posterior component measures 3.7 cm craniocaudal by approximately
1.8 cm transverse by 0.8 cm AP. The 2 limbs could connect in the
soft tissues below the glenoid. Also again seen is a bone fragment
in the coracobrachialis which measures 1.4 cm transverse by a 0.8 cm
AP by 1.7 cm craniocaudal, slightly increased compared the prior
exam.

The acromioclavicular joint has been debrided without evidence of by
0.5 cm AP by 2.1 cm transverse complication. No acute bony
abnormality is seen.

Ligaments

Suboptimally assessed by CT.

Muscles and Tendons

Fatty atrophy of the supraspinatus, infraspinatus and subscapularis
muscles is unchanged. No intramuscular fluid collection or gas is
identified.

Soft tissues

No soft tissue gas or swelling is identified. Imaged lung parenchyma
demonstrates centrilobular and paraseptal emphysema. Calcific aortic
and coronary atherosclerosis is noted.
IMPRESSION: Improved appearance of the left shoulder. No evidence of soft tissue
infection is present on today's examination. Periosteal reaction
about the humerus seen on the prior examination has matured and
noted new periosteal reaction is identified.

Large bony spur off the inferior aspect of the glenoid is been
debulked since the prior examination with remnant as described
above.

Heterotopic calcification in the coracobrachialis is slightly
increased in size since the prior study.

Atrophy of the rotator cuff musculature, unchanged.

Status post debridement of the AC joint.

Emphysema.

Calcific aortic and coronary atherosclerosis.

3-dimensional CT images were rendered by post-processing of the
original CT data at independent workstation. The 3-dimensional CT
images were interpreted, and findings were reported in the
accompanying complete CT report for this study.

## 2017-01-03 ENCOUNTER — Ambulatory Visit (INDEPENDENT_AMBULATORY_CARE_PROVIDER_SITE_OTHER): Payer: Medicare Other | Admitting: Infectious Diseases

## 2017-01-03 ENCOUNTER — Encounter: Payer: Self-pay | Admitting: Infectious Diseases

## 2017-01-03 DIAGNOSIS — F17200 Nicotine dependence, unspecified, uncomplicated: Secondary | ICD-10-CM

## 2017-01-03 DIAGNOSIS — T847XXD Infection and inflammatory reaction due to other internal orthopedic prosthetic devices, implants and grafts, subsequent encounter: Secondary | ICD-10-CM

## 2017-01-03 DIAGNOSIS — E119 Type 2 diabetes mellitus without complications: Secondary | ICD-10-CM | POA: Diagnosis not present

## 2017-01-03 NOTE — Assessment & Plan Note (Signed)
Appears to be well controlled.   

## 2017-01-03 NOTE — Progress Notes (Signed)
   Subjective:    Patient ID: Frederick Guzman, male    DOB: 08/15/1950, 66 y.o.   MRN: [id number]  HPI 66 yo M with hx of L shoulder arthroplasty 01-30-16, subsequent I &D due to infection on 04-01-16.  His Cx grew MSSE and P acnes.  He was treated with amp-rifampin.  He returned on 08-09-16 and underwent resection of his prosthesis. His repeat Cx were (-). He was d/c home on ancef.  He is anticipating a 2 stage procedure.   [birth year] 09-17-16 [id number]  [id number]  [id number]   He was seen in ID clinic on 7-2 and his IV anbx were stopped and he was started on po doxy.  He was also noted to have increased Glc, assumed to prednisone. He was to f/u with IM in [facility].  He had A1C of 8.4% on 7-31. On metformin --> amaryl.   He continues to have pain in his shoulder.  Limited range of motion. He has been on doxy since his last visit (10-2016) He had CT scan on done on 12-30-16:  Improved appearance of the left shoulder. No evidence of soft tissue infection is present on today's examination. Periosteal reaction about the humerus seen on the prior examination has matured and noted new periosteal reaction is identified.  Large bony spur off the inferior aspect of the glenoid is been debulked since the prior examination with remnant as described above. Heterotopic calcification in the coracobrachialis is slightly increased in size since the prior study. Atrophy of the rotator cuff musculature, unchanged. Status post debridement of the [facility] joint. Emphysema.  He is concerned about his CT saying "emphysema". He had CT done at [place] (11-25-16) sleep center that did not show emphysema by his recollection (i  reviewed this with him and did show empyhsema).  He is a smoker.   He FSG have avg 100.   Review of Systems  Constitutional: Negative for appetite change, chills and unexpected weight change.  Gastrointestinal: Negative for constipation and diarrhea.  Endocrine: Negative for polyuria.  Genitourinary: Negative for difficulty urinating.  Musculoskeletal: Positive for arthralgias.  Please see HPI. All other systems reviewed and negative.      Objective:   Physical Exam  Constitutional: He appears well-developed and well-nourished.  HENT:  Mouth/Throat: No oropharyngeal exudate.  Eyes: Pupils are equal, round, and reactive to light.  Neck: Neck supple.  Cardiovascular: Normal rate, regular rhythm and normal heart sounds.   Pulmonary/Chest: Effort normal and breath sounds normal.  Abdominal: Soft. Bowel sounds are normal. There is no tenderness. There is no rebound.  Musculoskeletal: He exhibits no edema.       Arms:      Feet:  Lymphadenopathy:    He has no cervical adenopathy.  Vitals reviewed.         Assessment & Plan:

## 2017-01-03 NOTE — Assessment & Plan Note (Signed)
His inflammatory markers have stabilized.  Advised him to continue doxy til he has surgery Ask that surgery repeat his Cx at time of surgery.  As long as these are (-), he can stop doxy.  rtc prn.

## 2017-01-03 NOTE — Assessment & Plan Note (Signed)
Encourage to quit smoking

## 2017-01-06 ENCOUNTER — Other Ambulatory Visit: Payer: Self-pay

## 2017-01-06 DIAGNOSIS — E1165 Type 2 diabetes mellitus with hyperglycemia: Secondary | ICD-10-CM

## 2017-01-06 MED ORDER — GLUCOSE BLOOD VI STRP: ORAL_STRIP | 99 refills | Status: DC

## 2017-01-06 NOTE — H&P (Signed)
Frederick Guzman is an 66 y.o. male.    Chief Complaint: left shoulder pain  HPI: Pt is a 66 y.o. male complaining of left shoulder pain for multiple years. Pain had continually increased since the beginning. X-rays in the clinic show antibiotic spacer left shoulder s/p reverse total shoulder implant removal. Pt has tried various conservative treatments which have failed to alleviate their symptoms, including injections and therapy. Various options are discussed with the patient. Risks, benefits and expectations were discussed with the patient. Patient understand the risks, benefits and expectations and wishes to proceed with surgery.   PCP:  [name], DO  D/C Plans: Home  PMH: Past Medical History:  Diagnosis Date  . Arthritis    OA  . Asthma   . Cancer ([place]) 08/2014   melanoma on R shoulder & back - [facility]   . COPD (chronic obstructive pulmonary disease) ([place])   . Depression    pt. denies- 01/2016  . Diverticulitis   . Dyspnea    with exertion  . Fibromyalgia   . GERD (gastroesophageal reflux disease)   . Hyperlipemia   . Hypertension    "never was high"  . Pneumonia 06/2015  . Sleep apnea    last study - Nov. 2017, waiting for a new machine, using the old CPAP q night after cleaning the device after he was documented to have pneumonia from his CPAP machine.     PSH: Past Surgical History:  Procedure Laterality Date  . COLONOSCOPY W/ POLYPECTOMY    . COLONOSCOPY W/ POLYPECTOMY    . EXCISIONAL TOTAL SHOULDER ARTHROPLASTY WITH ANTIBIOTIC SPACER Left 08/09/2016   Procedure: LEFT SHOULDER HARDWARE REMOVAL, IRRIGATION AND DEBRIDEMENT, PLACEMENT OF ANTIBIOTIC SPACER;  Surgeon: Netta Cedars, MD;  Location: [facility];  Service: Orthopedics;  Laterality: Left;  . IRRIGATION AND DEBRIDEMENT SHOULDER Left 03/31/2016   Procedure: IRRIGATION AND DEBRIDEMENT SHOULDER, deep cultures and polyethylene exchange;  Surgeon: Netta Cedars, MD;  Location: [facility];  Service:  Orthopedics;  Laterality: Left;  requests 4mins  . REVERSE SHOULDER ARTHROPLASTY Left 01/30/2016   Procedure: LEFT REVERSE SHOULDER ARTHROPLASTY;  Surgeon: Netta Cedars, MD;  Location: [facility];  Service: Orthopedics;  Laterality: Left;  . ROTATOR CUFF REPAIR Left   . WRIST SURGERY Right 11/2014   Dr. Amedeo Plenty    Social History:  reports that he has been smoking.  He has a 42.00 pack-year smoking history. He has never used smokeless tobacco. He reports that he does not drink alcohol or use drugs.  Allergies:  Allergies  Allergen Reactions  . Methotrexate Derivatives Nausea And Vomiting and Other (See Comments)    Mood change  . Ultram [Tramadol Hcl] Nausea And Vomiting    Medications: No current facility-administered medications for this encounter.    Current Outpatient Prescriptions  Medication Sig Dispense Refill  . albuterol (PROAIR HFA) 108 (90 Base) MCG/ACT inhaler Inhale 2 puffs into the lungs every 6 (six) hours as needed for wheezing or shortness of breath.    Marland Kitchen aspirin EC 81 MG tablet Take 81 mg by mouth at bedtime.    Marland Kitchen atorvastatin (LIPITOR) 20 MG tablet Take 1 tablet (20 mg total) by mouth every other day. 90 tablet 1  . b complex vitamins tablet Take 1 tablet by mouth daily.    . budesonide-formoterol (SYMBICORT) 160-4.5 MCG/ACT inhaler Inhale 2 puffs into the lungs 2 (two) times daily.    Marland Kitchen buPROPion (WELLBUTRIN XL) 150 MG 24 hr tablet Take 1 tablet (150 mg total)  by mouth daily. 90 tablet 1  . cetirizine (ZYRTEC) 10 MG tablet Take 10 mg by mouth daily.    . dapagliflozin propanediol (FARXIGA) 5 MG TABS tablet Take 5 mg by mouth daily. (Patient not taking: Reported on 01/03/2017) 90 tablet 1  . doxycycline (VIBRA-TABS) 100 MG tablet Take 1 tablet (100 mg total) by mouth 2 (two) times daily. 120 tablet 3  . DULoxetine (CYMBALTA) 30 MG capsule Take 1 capsule (30 mg total) by mouth daily. 90 capsule 0  . DULoxetine (CYMBALTA) 60 MG capsule Take 1 capsule (60 mg total) by mouth  daily. 90 capsule 0  . esomeprazole (NEXIUM) 20 MG capsule Take 20 mg by mouth daily.     . finasteride (PROSCAR) 5 MG tablet Take 5 mg by mouth daily.     . fluticasone (FLONASE) 50 MCG/ACT nasal spray Place 1 spray into both nostrils daily as needed for allergies.  1  . glimepiride (AMARYL) 2 MG tablet TAKE 1 TABLET BY MOUTH ONCE DAILY 90 tablet 0  . glucose blood test strip Use up to 4 times per day as directed with glucometer. Disp: 100. Refill x99 100 each 99  . ipratropium-albuterol (DUONEB) 0.5-2.5 (3) MG/3ML SOLN Take 3 mLs by nebulization every 6 (six) hours as needed. 360 mL 1  . Lancets (FREESTYLE) lancets Use as instructed up to tid 100 each 99  . lisinopril (PRINIVIL,ZESTRIL) 2.5 MG tablet Take 1 tablet (2.5 mg total) by mouth at bedtime. 90 tablet 3  . Multiple Vitamin (MULTIVITAMIN WITH MINERALS) TABS tablet Take 1 tablet by mouth at bedtime.    . Omega-3 Fatty Acids (FISH OIL) 1000 MG CAPS Take 1,000 mg by mouth at bedtime.     Marland Kitchen oxyCODONE-acetaminophen (PERCOCET) 7.5-325 MG tablet Take 1-2 tablets by mouth every 4 (four) hours as needed for severe pain. 40 tablet 0  . sildenafil (REVATIO) 20 MG tablet TAKE 2-5 TABLETS AS NEEDED  3  . Tamsulosin HCl (FLOMAX) 0.4 MG CAPS Take 0.4 mg by mouth 2 (two) times daily.     . Testosterone (AXIRON) 30 MG/ACT SOLN Alternate every other day, one pump under one arm and then one pump under both arms 90 mL 1  . varenicline (CHANTIX CONTINUING MONTH PAK) 1 MG tablet Take 1 tablet (1 mg total) by mouth 2 (two) times daily. (Patient not taking: Reported on 01/03/2017) 60 tablet 2  . varenicline (CHANTIX STARTING MONTH PAK) 0.5 MG X 11 & 1 MG X 42 tablet Take one 0.5 mg tablet by mouth once daily for 3 days, then increase to one 0.5 mg tablet twice daily for 4 days, then increase to one 1 mg tablet twice daily. (Patient not taking: Reported on 01/03/2017) 42 tablet 0  . vitamin C (ASCORBIC ACID) 500 MG tablet Take 500 mg by mouth daily.      No  results found for this or any previous visit (from the past 48 hour(s)). No results found.  ROS: Pain with rom of the left upper extremity  Physical Exam:  Alert and oriented 66 y.o. male in no acute distress Cranial nerves 2-12 intact Cervical spine: full rom with no tenderness, nv intact distally Chest: active breath sounds bilaterally, no wheeze rhonchi or rales Heart: regular rate and rhythm, no murmur Abd: non tender non distended with active bowel sounds Hip is stable with rom  Left shoulder with limited rom Mild pain/sorenes with rom nv intact distally No signs of ongoing infection currently  Assessment/Plan Assessment: left shoulder s/p  antibiotic spacer  Plan: Patient will undergo a left reverse total arthroplasty revision by Dr. Veverly Fells at [facility] Risks benefits and expectations were discussed with the patient. Patient understand risks, benefits and expectations and wishes to proceed.

## 2017-01-11 NOTE — Pre-Procedure Instructions (Signed)
Frederick Guzman  01/11/2017      [facility] - [place], [place] - [address] [place] [address] Phone: [phone] Fax: [phone]    Your procedure is scheduled on Monday, January 17, 2017  Report to [facility] Admitting Entrance "A" at 11:00 A.M.   Call this number if you have problems the morning of surgery:  [phone]   Remember:  Do not eat food or drink liquids after midnight.  Take these medicines the morning of surgery with A SIP OF WATER: BuPROPion (WELLBUTRIN XL), DULoxetine (CYMBALTA), Esomeprazole (NEXIUM),  Tamsulosin HCl (FLOMAX), Doxycycline (VIBRA-TABS), and Budesonide-formoterol (SYMBICORT). If needed Cetirizine (ZYRTEC) for allergies, Albuterol (PROAIR HFA) for cough or wheezing (bring with you the day of surgery).  As of today, stop taking all Aspirins, Vitamins, Fish oils, and Herbal medications. Also stop all NSAIDS i.e. Advil, Ibuprofen, Motrin, Aleve, Anaprox, Naproxen, BC and Goody Powders.  How to Manage Your Diabetes Before and After Surgery  Why is it important to control my blood sugar before and after surgery? . Improving blood sugar levels before and after surgery helps healing and can limit problems. . A way of improving blood sugar control is eating a healthy diet by: o  Eating less sugar and carbohydrates o  Increasing activity/exercise o  Talking with your doctor about reaching your blood sugar goals . High blood sugars (greater than 180 mg/dL) can raise your risk of infections and slow your recovery, so you will need to focus on controlling your diabetes during the weeks before surgery. . Make sure that the doctor who takes care of your diabetes knows about your planned surgery including the date and location.  How do I manage my blood sugar before surgery? . Check your blood sugar at least 4 times a day, starting 2 days before surgery, to make sure that the level is not too high or  low. o Check your blood sugar the morning of your surgery when you wake up and every 2 hours until you get to the Short Stay unit. . If your blood sugar is less than 70 mg/dL, you will need to treat for low blood sugar: o Do not take insulin. o Treat a low blood sugar (less than 70 mg/dL) with  cup of clear juice (cranberry or apple), 4 glucose tablets, OR glucose gel. o Recheck blood sugar in 15 minutes after treatment (to make sure it is greater than 70 mg/dL). If your blood sugar is not greater than 70 mg/dL on recheck, call [phone] for further instructions. . Report your blood sugar to the short stay nurse when you get to Short Stay.  . If you are admitted to the hospital after surgery: o Your blood sugar will be checked by the staff and you will probably be given insulin after surgery (instead of oral diabetes medicines) to make sure you have good blood sugar levels. o The goal for blood sugar control after surgery is 80-180 mg/dL.  WHAT DO I DO ABOUT MY DIABETES MEDICATION?  Marland Kitchen Do not take Glimepiride (AMARYL) the morning of surgery.  . If your CBG is greater than 220 mg/dL, call us at [phone]   Do not wear jewelry.  Do not wear lotions, powders, colognes, or deodorant.  Do not shave 48 hours prior to surgery.  Men may shave face and neck.  Do not bring valuables to the hospital.  [facility] is not responsible for any belongings or valuables.  Contacts,  dentures or bridgework may not be worn into surgery.  Leave your suitcase in the car.  After surgery it may be brought to your room.  For patients admitted to the hospital, discharge time will be determined by your treatment team.  Patients discharged the day of surgery will not be allowed to drive home.   Special instructions:   [facility]- Preparing For Surgery  Before surgery, you can play an important role. Because skin is not sterile, your skin needs to be as free of germs as possible. You can reduce the number  of germs on your skin by washing with CHG (chlorahexidine gluconate) Soap before surgery.  CHG is an antiseptic cleaner which kills germs and bonds with the skin to continue killing germs even after washing.  Please do not use if you have an allergy to CHG or antibacterial soaps. If your skin becomes reddened/irritated stop using the CHG.  Do not shave (including legs and underarms) for at least 48 hours prior to first CHG shower. It is OK to shave your face.  Please follow these instructions carefully.   1. Shower the NIGHT BEFORE SURGERY and the MORNING OF SURGERY with CHG.   2. If you chose to wash your hair, wash your hair first as usual with your normal shampoo.  3. After you shampoo, rinse your hair and body thoroughly to remove the shampoo.  4. Use CHG as you would any other liquid soap. You can apply CHG directly to the skin and wash gently with a scrungie or a clean washcloth.   5. Apply the CHG Soap to your body ONLY FROM THE NECK DOWN.  Do not use on open wounds or open sores. Avoid contact with your eyes, ears, mouth and genitals (private parts). Wash Face and genitals (private parts)  with your normal soap.  6. Wash thoroughly, paying special attention to the area where your surgery will be performed.  7. Thoroughly rinse your body with warm water from the neck down.  8. DO NOT shower/wash with your normal soap after using and rinsing off the CHG Soap.  9. Pat yourself dry with a CLEAN TOWEL.  10. Wear CLEAN PAJAMAS to bed the night before surgery, wear comfortable clothes the morning of surgery  11. Place CLEAN SHEETS on your bed the night of your first shower and DO NOT SLEEP WITH PETS.  Day of Surgery: Do not apply any deodorants/lotions. Please wear clean clothes to the hospital/surgery center.    Please read over the following fact sheets that you were given. Pain Booklet, Coughing and Deep Breathing, Total Joint Packet, MRSA Information and Surgical Site Infection  Prevention

## 2017-01-12 ENCOUNTER — Encounter (HOSPITAL_COMMUNITY): Payer: Self-pay

## 2017-01-12 ENCOUNTER — Other Ambulatory Visit: Payer: Self-pay

## 2017-01-12 ENCOUNTER — Encounter (HOSPITAL_COMMUNITY)
Admission: RE | Admit: 2017-01-12 | Discharge: 2017-01-12 | Disposition: A | Discharge status: Home / Self Care | Payer: Medicare Other | Source: Ambulatory Visit | Attending: Orthopedic Surgery | Admitting: Orthopedic Surgery

## 2017-01-12 DIAGNOSIS — Z01812 Encounter for preprocedural laboratory examination: Secondary | ICD-10-CM | POA: Diagnosis not present

## 2017-01-12 DIAGNOSIS — M25512 Pain in left shoulder: Secondary | ICD-10-CM | POA: Insufficient documentation

## 2017-01-12 HISTORY — DX: Type 2 diabetes mellitus without complications: E11.9

## 2017-01-12 LAB — BASIC METABOLIC PANEL
ANION GAP: 9 (ref 5–15)
BUN: 10 mg/dL (ref 6–20)
CALCIUM: 9.5 mg/dL (ref 8.9–10.3)
CO2: 26 mmol/L (ref 22–32)
Chloride: 102 mmol/L (ref 101–111)
Creatinine, Ser: 0.95 mg/dL (ref 0.61–1.24)
GFR calc non Af Amer: >60 mL/min (ref >60)
GLUCOSE: 109 mg/dL — AB (ref 65–99)
POTASSIUM: 3.9 mmol/L (ref 3.5–5.1)
SODIUM: 137 mmol/L (ref 135–145)

## 2017-01-12 LAB — SURGICAL PCR SCREEN
MRSA, PCR: NEGATIVE
STAPHYLOCOCCUS AUREUS: NEGATIVE

## 2017-01-12 LAB — CBC
HEMATOCRIT: 46.7 % (ref 39.0–52.0)
HEMOGLOBIN: 15.9 g/dL (ref 13.0–17.0)
MCH: 30.6 pg (ref 26.0–34.0)
MCHC: 34 g/dL (ref 30.0–36.0)
MCV: 90 fL (ref 78.0–100.0)
PLATELETS: 224 10*3/uL (ref 150–400)
RBC: 5.19 MIL/uL (ref 4.22–5.81)
RDW: 15.7 % — ABNORMAL HIGH (ref 11.5–15.5)
WBC: 7.8 10*3/uL (ref 4.0–10.5)

## 2017-01-12 LAB — TYPE AND SCREEN
ABO/RH(D): A NEG
Antibody Screen: NEGATIVE

## 2017-01-12 LAB — GLUCOSE, CAPILLARY: Glucose-Capillary: 141 mg/dL — ABNORMAL HIGH (ref 65–99)

## 2017-01-12 NOTE — Progress Notes (Addendum)
Frederick Guzman, Frederick Bal, DO  Cardiologist: pt denies  EKG: pt denies past year  Stress test:2017 per pt at [employer] office, records requested-09/2010 in [place]  ECHO: 2017 per pt recall at [employer] pt recall, records requested  Cardiac Cath: pt denies  Chest x-ray: pt denies past year, no recent respiratory infection/complications  Pt is unclear what cardiac studies were performed, I have requested all studies be sent as it isn't clear under Care Everywhere that these were completed. He reports no abnormal results came back from any study and they were only done to establish a baseline

## 2017-01-13 NOTE — Progress Notes (Addendum)
Anesthesia Chart Review:  Pt is a 66 year old male scheduled for L shoulder revision, reverse total shoulder arthroplasty on 01/17/2017 with Netta Cedars, MD  Pt is s/p L shoulder arthroplasty 01/30/16 that became infected -> s/p irrigation and debridement L shoulder 03/31/16 -> L shoulder hardware removal 08/09/16.  Now for L shoulder revision.   - PCP is [name], DO - ID is Bobby Rumpf, MD - Pulmonary care at [facility] in [place]; last office visit 11/16/16 with Rinaldo Cloud, NP (notes in care everywhere)   PMH includes:  HTN, DM, hyperlipidemia, COPD, OSA, asthma, melanoma, GERD. Current smoker. BMI 26.5  Medications include: albuterol, ASA 81mg , lipitor, symbicort, farxiga, doxycycline, nexium, glimepiride, duoneb, lisinopril, sildenafil  BP 132/70   Pulse 75   Temp 36.8 C   Resp 20   Ht 6' (1.829 m)   Wt 195 lb 12.8 oz (88.8 kg)   SpO2 100%   BMI 26.56 kg/m    Preoperative labs reviewed.    CT chest (lung ca screening) 11/25/16 (care everywhere):  - Lungs and bronchi: Moderate diffuse centrilobular emphysema with paraseptal emphysema greatest in the upper lobes. Bronchial wall thickening. Coarsening of the interstitium greatest in the subpleural regions. - Minimal scattered LAD coronary artery calcification. Aortic ASVD. Left shoulder arthroplasty. Lower cervical and mid thoracic spondylosis. - No suspicious pulmonary nodules.  EKG 01/12/17: NSR. Rightward axis  Nuclear stress test 01/11/14 [facility]):  - No scintigraphic evidence of inducible myocardial ischemia. Normal exercise nuclear stress test.  - Post-stress EF 66%. Global LV systolic function normal.  - Exercise capacity 11 METS.  - Low risk scan.  If no changes, I anticipate pt can proceed with surgery as scheduled.   Willeen Cass, FNP-BC [facility]/Anesthesiology Phone: [phone] 01/13/2017 12:30 PM

## 2017-01-17 ENCOUNTER — Inpatient Hospital Stay (HOSPITAL_COMMUNITY): Payer: Medicare Other | Admitting: Anesthesiology

## 2017-01-17 ENCOUNTER — Encounter (HOSPITAL_COMMUNITY)
Admission: RE | Disposition: A | Discharge status: Home / Self Care | Payer: Self-pay | Source: Ambulatory Visit | Attending: Orthopedic Surgery

## 2017-01-17 ENCOUNTER — Inpatient Hospital Stay (HOSPITAL_COMMUNITY): Payer: Medicare Other

## 2017-01-17 ENCOUNTER — Inpatient Hospital Stay (HOSPITAL_COMMUNITY)
Admission: RE | Admit: 2017-01-17 | Discharge: 2017-01-18 | DRG: 483 | Disposition: A | Discharge status: Home / Self Care | Payer: Medicare Other | Source: Ambulatory Visit | Attending: Orthopedic Surgery | Admitting: Orthopedic Surgery

## 2017-01-17 ENCOUNTER — Encounter (HOSPITAL_COMMUNITY): Payer: Self-pay

## 2017-01-17 ENCOUNTER — Inpatient Hospital Stay (HOSPITAL_COMMUNITY): Payer: Medicare Other | Admitting: Emergency Medicine

## 2017-01-17 DIAGNOSIS — G4730 Sleep apnea, unspecified: Secondary | ICD-10-CM | POA: Diagnosis present

## 2017-01-17 DIAGNOSIS — J449 Chronic obstructive pulmonary disease, unspecified: Secondary | ICD-10-CM | POA: Diagnosis present

## 2017-01-17 DIAGNOSIS — M797 Fibromyalgia: Secondary | ICD-10-CM | POA: Diagnosis present

## 2017-01-17 DIAGNOSIS — T8459XA Infection and inflammatory reaction due to other internal joint prosthesis, initial encounter: Principal | ICD-10-CM | POA: Diagnosis present

## 2017-01-17 DIAGNOSIS — T8459XD Infection and inflammatory reaction due to other internal joint prosthesis, subsequent encounter: Secondary | ICD-10-CM | POA: Diagnosis not present

## 2017-01-17 DIAGNOSIS — Z96612 Presence of left artificial shoulder joint: Secondary | ICD-10-CM | POA: Diagnosis not present

## 2017-01-17 DIAGNOSIS — Z7984 Long term (current) use of oral hypoglycemic drugs: Secondary | ICD-10-CM

## 2017-01-17 DIAGNOSIS — Z85820 Personal history of malignant melanoma of skin: Secondary | ICD-10-CM | POA: Diagnosis not present

## 2017-01-17 DIAGNOSIS — Z419 Encounter for procedure for purposes other than remedying health state, unspecified: Secondary | ICD-10-CM

## 2017-01-17 DIAGNOSIS — F17210 Nicotine dependence, cigarettes, uncomplicated: Secondary | ICD-10-CM | POA: Diagnosis present

## 2017-01-17 DIAGNOSIS — Z471 Aftercare following joint replacement surgery: Secondary | ICD-10-CM | POA: Diagnosis not present

## 2017-01-17 DIAGNOSIS — Z9989 Dependence on other enabling machines and devices: Secondary | ICD-10-CM | POA: Diagnosis not present

## 2017-01-17 DIAGNOSIS — I10 Essential (primary) hypertension: Secondary | ICD-10-CM | POA: Diagnosis present

## 2017-01-17 DIAGNOSIS — Z79899 Other long term (current) drug therapy: Secondary | ICD-10-CM | POA: Diagnosis not present

## 2017-01-17 DIAGNOSIS — E785 Hyperlipidemia, unspecified: Secondary | ICD-10-CM | POA: Diagnosis present

## 2017-01-17 DIAGNOSIS — Z7982 Long term (current) use of aspirin: Secondary | ICD-10-CM

## 2017-01-17 DIAGNOSIS — Z888 Allergy status to other drugs, medicaments and biological substances status: Secondary | ICD-10-CM

## 2017-01-17 DIAGNOSIS — Z885 Allergy status to narcotic agent status: Secondary | ICD-10-CM | POA: Diagnosis not present

## 2017-01-17 DIAGNOSIS — E119 Type 2 diabetes mellitus without complications: Secondary | ICD-10-CM | POA: Diagnosis present

## 2017-01-17 DIAGNOSIS — K219 Gastro-esophageal reflux disease without esophagitis: Secondary | ICD-10-CM | POA: Diagnosis present

## 2017-01-17 DIAGNOSIS — Z7951 Long term (current) use of inhaled steroids: Secondary | ICD-10-CM | POA: Diagnosis not present

## 2017-01-17 DIAGNOSIS — G8918 Other acute postprocedural pain: Secondary | ICD-10-CM | POA: Diagnosis not present

## 2017-01-17 HISTORY — PX: TOTAL SHOULDER REVISION: SHX6130

## 2017-01-17 LAB — GLUCOSE, CAPILLARY
GLUCOSE-CAPILLARY: 95 mg/dL (ref 65–99)
Glucose-Capillary: 96 mg/dL (ref 65–99)
Glucose-Capillary: 202 mg/dL — ABNORMAL HIGH (ref 65–99)

## 2017-01-17 IMAGING — DX DG SHOULDER 1V*L*
2 series · 2 of 2 positions shown · non-contrast
Comparison: [DATE] CT

CLINICAL DATA: Left shoulder replacement

EXAM:
LEFT SHOULDER - 1 VIEW

[shoulder]
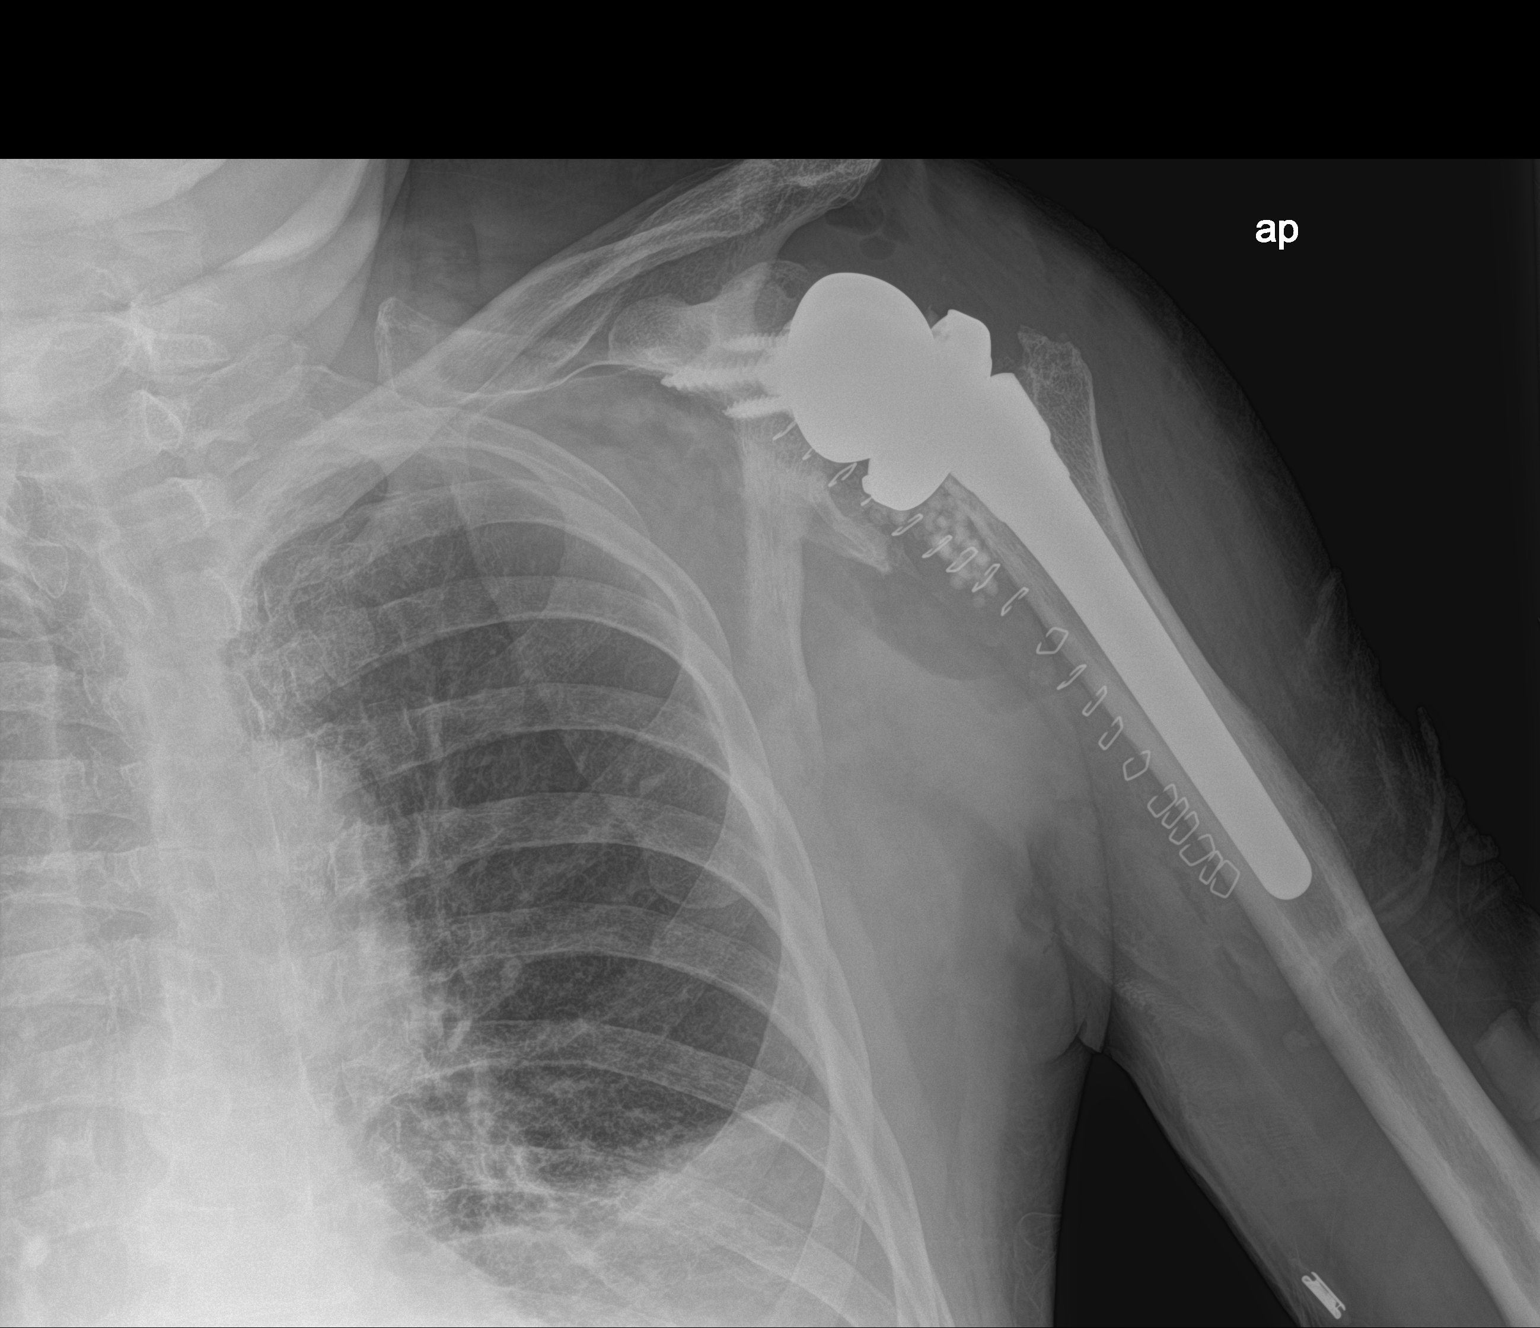

[scapula]
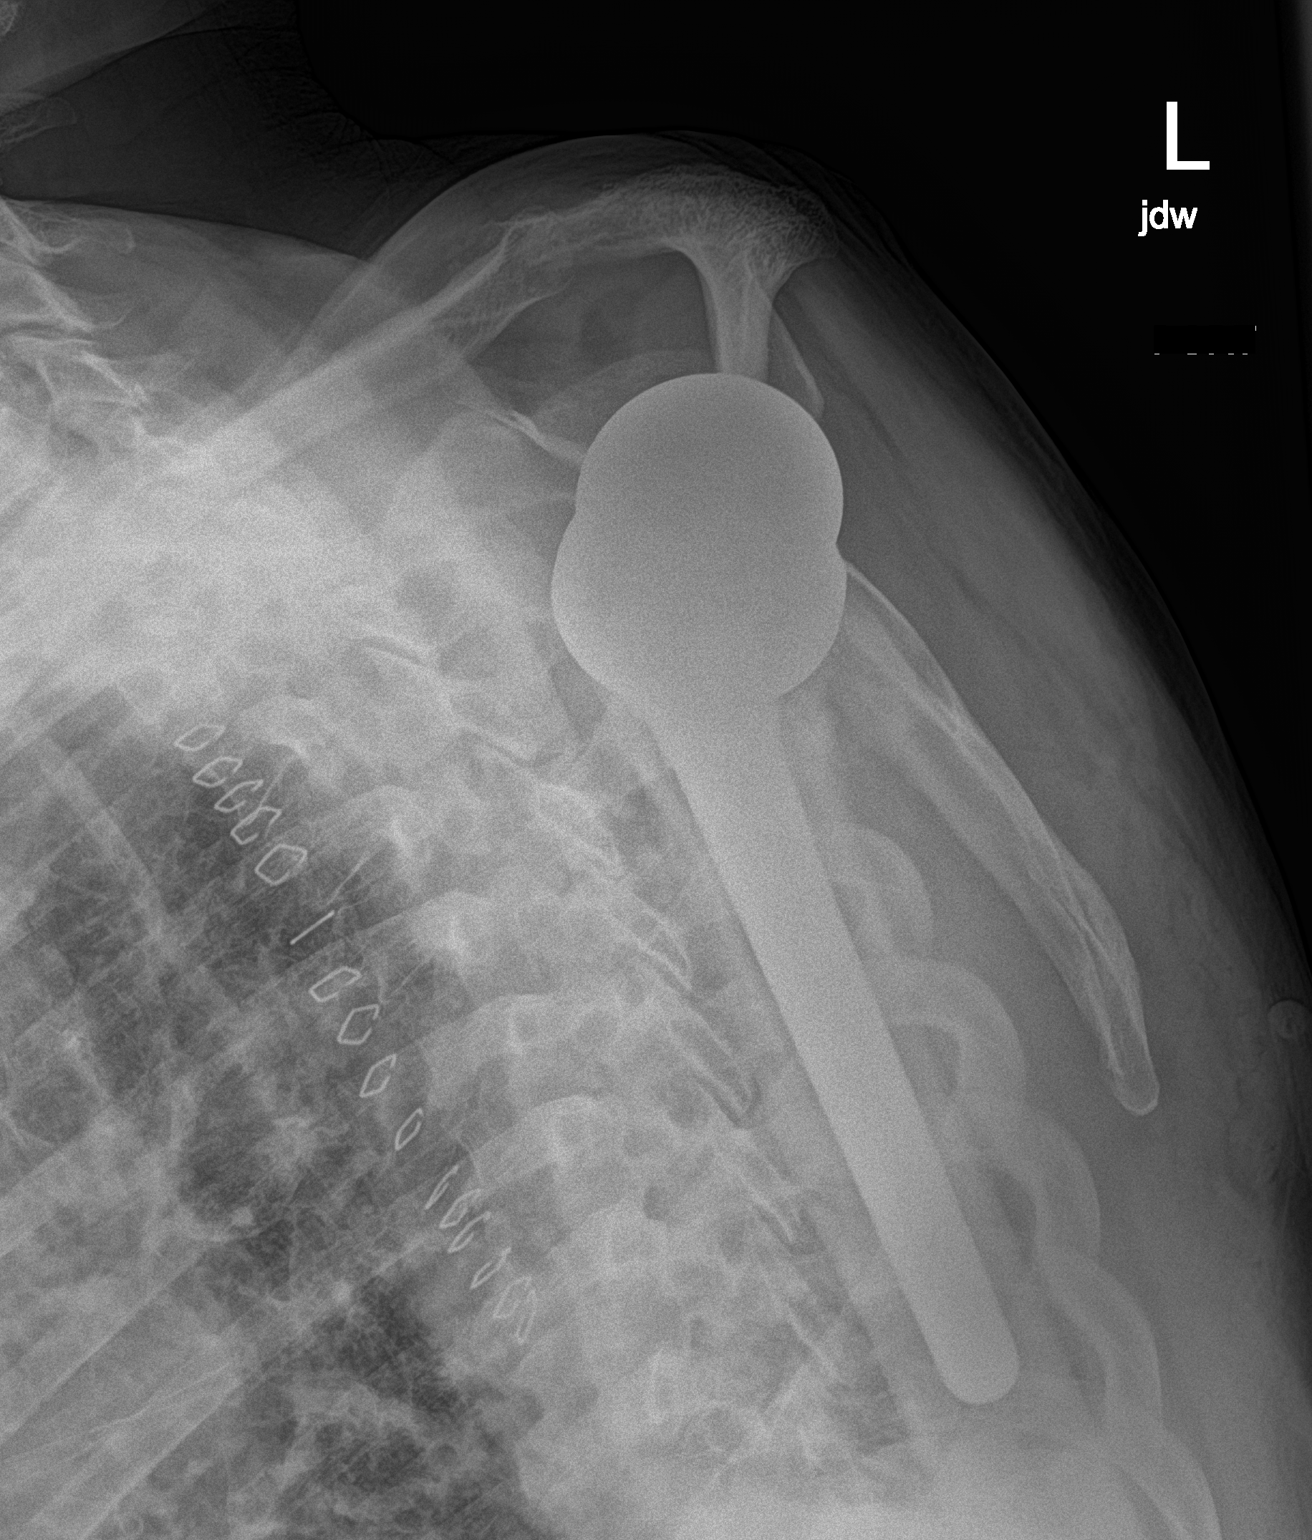

[2 of 2 positions shown; findings below may reference images not displayed]

FINDINGS: New left reverse shoulder arthroplasty with antibiotic impregnated
spacer is. Postop skin staples and subcutaneous emphysema about the
left shoulder. No fracture or immediate postoperative complications.
No joint dislocation is apparent.
IMPRESSION: No acute fracture immediate postoperative complications status post
left reverse shoulder arthroplasty. Postop soft tissue change and
antibiotic spacers noted.

## 2017-01-17 SURGERY — REVISION, TOTAL ARTHROPLASTY, SHOULDER
Anesthesia: Regional | Site: Shoulder | Laterality: Left

## 2017-01-17 MED ORDER — TAMSULOSIN HCL 0.4 MG PO CAPS: 0.4000 mg | ORAL_CAPSULE | Freq: Two times a day (BID) | ORAL | Status: DC

## 2017-01-17 MED ORDER — BISACODYL 10 MG RE SUPP: 10.0000 mg | Freq: Every day | RECTAL | Status: DC | PRN

## 2017-01-17 MED ORDER — PHENYLEPHRINE HCL 10 MG/ML IJ SOLN
INTRAMUSCULAR | Status: AC
  Filled 2017-01-17: qty 1

## 2017-01-17 MED ORDER — LACTATED RINGERS IV SOLN
INTRAVENOUS | Status: DC
  Administered 2017-01-17 (×3): via INTRAVENOUS

## 2017-01-17 MED ORDER — INSULIN ASPART 100 UNIT/ML ~~LOC~~ SOLN
0.0000 [IU] | Freq: Three times a day (TID) | SUBCUTANEOUS | Status: DC
  Administered 2017-01-18: 3 [IU] via SUBCUTANEOUS

## 2017-01-17 MED ORDER — LIDOCAINE HCL (CARDIAC) 20 MG/ML IV SOLN
INTRAVENOUS | Status: DC | PRN
  Administered 2017-01-17: 40 mg via INTRAVENOUS

## 2017-01-17 MED ORDER — PHENYLEPHRINE HCL 10 MG/ML IJ SOLN
INTRAMUSCULAR | Status: DC | PRN
  Administered 2017-01-17: 80 ug via INTRAVENOUS
  Administered 2017-01-17: 120 ug via INTRAVENOUS
  Administered 2017-01-17: 80 ug via INTRAVENOUS
  Administered 2017-01-17: 120 ug via INTRAVENOUS
  Administered 2017-01-17 (×2): 80 ug via INTRAVENOUS
  Administered 2017-01-17: 120 ug via INTRAVENOUS
  Administered 2017-01-17: 40 ug via INTRAVENOUS

## 2017-01-17 MED ORDER — PROPOFOL 10 MG/ML IV BOLUS
INTRAVENOUS | Status: AC
  Filled 2017-01-17: qty 20

## 2017-01-17 MED ORDER — ALBUMIN HUMAN 5 % IV SOLN
INTRAVENOUS | Status: DC | PRN
  Administered 2017-01-17 (×4): via INTRAVENOUS

## 2017-01-17 MED ORDER — ROCURONIUM BROMIDE 100 MG/10ML IV SOLN
INTRAVENOUS | Status: DC | PRN
  Administered 2017-01-17: 50 mg via INTRAVENOUS
  Administered 2017-01-17: 20 mg via INTRAVENOUS
  Administered 2017-01-17: 10 mg via INTRAVENOUS

## 2017-01-17 MED ORDER — IBUPROFEN 200 MG PO TABS: 600.0000 mg | ORAL_TABLET | Freq: Four times a day (QID) | ORAL | Status: DC | PRN

## 2017-01-17 MED ORDER — ONDANSETRON HCL 4 MG/2ML IJ SOLN: 4.0000 mg | Freq: Four times a day (QID) | INTRAMUSCULAR | Status: DC | PRN

## 2017-01-17 MED ORDER — ROCURONIUM BROMIDE 10 MG/ML (PF) SYRINGE
PREFILLED_SYRINGE | INTRAVENOUS | Status: AC
  Filled 2017-01-17: qty 5

## 2017-01-17 MED ORDER — INSULIN ASPART 100 UNIT/ML ~~LOC~~ SOLN
0.0000 [IU] | Freq: Every day | SUBCUTANEOUS | Status: DC
  Administered 2017-01-17: 2 [IU] via SUBCUTANEOUS

## 2017-01-17 MED ORDER — ACETAMINOPHEN 650 MG RE SUPP
650.0000 mg | RECTAL | Status: DC | PRN
Start: 2017-01-17 — End: 2017-01-18

## 2017-01-17 MED ORDER — METHOCARBAMOL 1000 MG/10ML IJ SOLN
500.0000 mg | Freq: Four times a day (QID) | INTRAVENOUS | Status: DC | PRN
  Filled 2017-01-17: qty 5

## 2017-01-17 MED ORDER — FLUTICASONE PROPIONATE 50 MCG/ACT NA SUSP
1.0000 | Freq: Every day | NASAL | Status: DC | PRN
  Filled 2017-01-17: qty 16

## 2017-01-17 MED ORDER — DOCUSATE SODIUM 100 MG PO CAPS
100.0000 mg | ORAL_CAPSULE | Freq: Two times a day (BID) | ORAL | Status: DC
  Administered 2017-01-17: 100 mg via ORAL
  Filled 2017-01-17: qty 1

## 2017-01-17 MED ORDER — ONDANSETRON HCL 4 MG PO TABS: 4.0000 mg | ORAL_TABLET | Freq: Four times a day (QID) | ORAL | Status: DC | PRN

## 2017-01-17 MED ORDER — METHOCARBAMOL 500 MG PO TABS
500.0000 mg | ORAL_TABLET | Freq: Four times a day (QID) | ORAL | Status: DC | PRN
  Administered 2017-01-18 (×2): 500 mg via ORAL
  Filled 2017-01-17 (×2): qty 1

## 2017-01-17 MED ORDER — IPRATROPIUM-ALBUTEROL 0.5-2.5 (3) MG/3ML IN SOLN: 3.0000 mL | Freq: Four times a day (QID) | RESPIRATORY_TRACT | Status: DC | PRN

## 2017-01-17 MED ORDER — INSULIN ASPART 100 UNIT/ML ~~LOC~~ SOLN
4.0000 [IU] | Freq: Three times a day (TID) | SUBCUTANEOUS | Status: DC
  Administered 2017-01-18: 4 [IU] via SUBCUTANEOUS

## 2017-01-17 MED ORDER — MIDAZOLAM HCL 5 MG/5ML IJ SOLN
INTRAMUSCULAR | Status: DC | PRN
  Administered 2017-01-17 (×2): 1 mg via INTRAVENOUS

## 2017-01-17 MED ORDER — DOXYCYCLINE HYCLATE 100 MG PO TABS
100.0000 mg | ORAL_TABLET | Freq: Two times a day (BID) | ORAL | Status: DC
  Filled 2017-01-17 (×2): qty 1

## 2017-01-17 MED ORDER — ONDANSETRON HCL 4 MG/2ML IJ SOLN
INTRAMUSCULAR | Status: DC | PRN
  Administered 2017-01-17: 4 mg via INTRAVENOUS

## 2017-01-17 MED ORDER — VANCOMYCIN HCL 500 MG IV SOLR
INTRAVENOUS | Status: DC | PRN
  Administered 2017-01-17: 1000 mg

## 2017-01-17 MED ORDER — OXYCODONE-ACETAMINOPHEN 5-325 MG PO TABS: 1.0000 | ORAL_TABLET | ORAL | 0 refills | Status: DC | PRN

## 2017-01-17 MED ORDER — BUPROPION HCL ER (XL) 150 MG PO TB24
150.0000 mg | ORAL_TABLET | Freq: Every day | ORAL | Status: DC
Start: 2017-01-18 — End: 2017-01-18
  Filled 2017-01-17: qty 1

## 2017-01-17 MED ORDER — CEFAZOLIN SODIUM-DEXTROSE 2-4 GM/100ML-% IV SOLN
INTRAVENOUS | Status: AC
  Filled 2017-01-17: qty 100

## 2017-01-17 MED ORDER — PHENYLEPHRINE 40 MCG/ML (10ML) SYRINGE FOR IV PUSH (FOR BLOOD PRESSURE SUPPORT)
PREFILLED_SYRINGE | INTRAVENOUS | Status: AC
  Filled 2017-01-17: qty 30

## 2017-01-17 MED ORDER — ADULT MULTIVITAMIN W/MINERALS CH: 1.0000 | ORAL_TABLET | Freq: Every day | ORAL | Status: DC

## 2017-01-17 MED ORDER — OXYCODONE HCL 5 MG PO TABS
10.0000 mg | ORAL_TABLET | ORAL | Status: DC | PRN
  Administered 2017-01-18 (×3): 10 mg via ORAL
  Filled 2017-01-17 (×3): qty 2

## 2017-01-17 MED ORDER — MIDAZOLAM HCL 2 MG/2ML IJ SOLN
INTRAMUSCULAR | Status: AC
  Administered 2017-01-17: 2 mg
  Filled 2017-01-17: qty 2

## 2017-01-17 MED ORDER — METHOCARBAMOL 500 MG PO TABS: 500.0000 mg | ORAL_TABLET | Freq: Three times a day (TID) | ORAL | 1 refills | Status: DC | PRN

## 2017-01-17 MED ORDER — LORATADINE 10 MG PO TABS: 10.0000 mg | ORAL_TABLET | Freq: Every day | ORAL | Status: DC

## 2017-01-17 MED ORDER — PHENOL 1.4 % MT LIQD
1.0000 | OROMUCOSAL | Status: DC | PRN
  Administered 2017-01-18: 1 via OROMUCOSAL
  Filled 2017-01-17: qty 177

## 2017-01-17 MED ORDER — MENTHOL 3 MG MT LOZG: 1.0000 | LOZENGE | OROMUCOSAL | Status: DC | PRN

## 2017-01-17 MED ORDER — BUPIVACAINE-EPINEPHRINE 0.25% -1:200000 IJ SOLN
INTRAMUSCULAR | Status: DC | PRN
Start: 2017-01-17 — End: 2017-01-17
  Administered 2017-01-17: 10 mL

## 2017-01-17 MED ORDER — B COMPLEX PO TABS: 1.0000 | ORAL_TABLET | Freq: Every day | ORAL | Status: DC

## 2017-01-17 MED ORDER — MIDAZOLAM HCL 2 MG/2ML IJ SOLN
INTRAMUSCULAR | Status: AC
  Filled 2017-01-17: qty 2

## 2017-01-17 MED ORDER — EPHEDRINE 5 MG/ML INJ
INTRAVENOUS | Status: AC
  Filled 2017-01-17: qty 20

## 2017-01-17 MED ORDER — OXYCODONE HCL 5 MG PO TABS: 5.0000 mg | ORAL_TABLET | ORAL | Status: DC | PRN

## 2017-01-17 MED ORDER — METOCLOPRAMIDE HCL 5 MG/ML IJ SOLN: 5.0000 mg | Freq: Three times a day (TID) | INTRAMUSCULAR | Status: DC | PRN

## 2017-01-17 MED ORDER — PANTOPRAZOLE SODIUM 40 MG PO TBEC: 40.0000 mg | DELAYED_RELEASE_TABLET | Freq: Every day | ORAL | Status: DC

## 2017-01-17 MED ORDER — CEFAZOLIN SODIUM-DEXTROSE 2-4 GM/100ML-% IV SOLN
2.0000 g | Freq: Four times a day (QID) | INTRAVENOUS | Status: DC
  Administered 2017-01-17 – 2017-01-18 (×2): 2 g via INTRAVENOUS
  Filled 2017-01-17 (×2): qty 100

## 2017-01-17 MED ORDER — LIDOCAINE 2% (20 MG/ML) 5 ML SYRINGE
INTRAMUSCULAR | Status: AC
  Filled 2017-01-17: qty 5

## 2017-01-17 MED ORDER — ONDANSETRON HCL 4 MG/2ML IJ SOLN
INTRAMUSCULAR | Status: AC
  Filled 2017-01-17: qty 4

## 2017-01-17 MED ORDER — LISINOPRIL 2.5 MG PO TABS
2.5000 mg | ORAL_TABLET | Freq: Every day | ORAL | Status: DC
  Filled 2017-01-17: qty 1

## 2017-01-17 MED ORDER — FENTANYL CITRATE (PF) 250 MCG/5ML IJ SOLN
INTRAMUSCULAR | Status: AC
  Filled 2017-01-17: qty 5

## 2017-01-17 MED ORDER — FENTANYL CITRATE (PF) 100 MCG/2ML IJ SOLN
INTRAMUSCULAR | Status: AC
  Administered 2017-01-17: 100 ug
  Filled 2017-01-17: qty 2

## 2017-01-17 MED ORDER — SUGAMMADEX SODIUM 200 MG/2ML IV SOLN
INTRAVENOUS | Status: AC
  Filled 2017-01-17: qty 2

## 2017-01-17 MED ORDER — GLYCOPYRROLATE 0.2 MG/ML IJ SOLN
INTRAMUSCULAR | Status: DC | PRN
  Administered 2017-01-17 (×2): 0.2 mg via INTRAVENOUS

## 2017-01-17 MED ORDER — FENTANYL CITRATE (PF) 100 MCG/2ML IJ SOLN: 25.0000 ug | INTRAMUSCULAR | Status: DC | PRN

## 2017-01-17 MED ORDER — EPHEDRINE SULFATE 50 MG/ML IJ SOLN
INTRAMUSCULAR | Status: DC | PRN
  Administered 2017-01-17 (×2): 10 mg via INTRAVENOUS
  Administered 2017-01-17 (×2): 5 mg via INTRAVENOUS
  Administered 2017-01-17: 10 mg via INTRAVENOUS
  Administered 2017-01-17: 50 mg via INTRAVENOUS
  Administered 2017-01-17: 10 mg via INTRAVENOUS

## 2017-01-17 MED ORDER — ALBUTEROL SULFATE HFA 108 (90 BASE) MCG/ACT IN AERS: 2.0000 | INHALATION_SPRAY | Freq: Four times a day (QID) | RESPIRATORY_TRACT | Status: DC | PRN

## 2017-01-17 MED ORDER — PROPOFOL 10 MG/ML IV BOLUS
INTRAVENOUS | Status: DC | PRN
  Administered 2017-01-17: 20 mg via INTRAVENOUS
  Administered 2017-01-17: 170 mg via INTRAVENOUS

## 2017-01-17 MED ORDER — VANCOMYCIN HCL 500 MG IV SOLR
INTRAVENOUS | Status: AC
  Filled 2017-01-17: qty 1000

## 2017-01-17 MED ORDER — ASPIRIN EC 81 MG PO TBEC: 81.0000 mg | DELAYED_RELEASE_TABLET | ORAL | Status: DC

## 2017-01-17 MED ORDER — BUPIVACAINE-EPINEPHRINE (PF) 0.25% -1:200000 IJ SOLN
INTRAMUSCULAR | Status: AC
  Filled 2017-01-17: qty 30

## 2017-01-17 MED ORDER — POLYETHYLENE GLYCOL 3350 17 G PO PACK: 17.0000 g | PACK | Freq: Every day | ORAL | Status: DC | PRN

## 2017-01-17 MED ORDER — OXYCODONE HCL 5 MG PO TABS
5.0000 mg | ORAL_TABLET | ORAL | Status: DC | PRN
  Administered 2017-01-17: 5 mg via ORAL
  Filled 2017-01-17: qty 1

## 2017-01-17 MED ORDER — FENTANYL CITRATE (PF) 100 MCG/2ML IJ SOLN
INTRAMUSCULAR | Status: DC | PRN
  Administered 2017-01-17 (×2): 50 ug via INTRAVENOUS
  Administered 2017-01-17: 25 ug via INTRAVENOUS
  Administered 2017-01-17: 50 ug via INTRAVENOUS

## 2017-01-17 MED ORDER — SODIUM CHLORIDE 0.9 % IV SOLN
INTRAVENOUS | Status: DC
  Administered 2017-01-17: 20:00:00 via INTRAVENOUS

## 2017-01-17 MED ORDER — CHLORHEXIDINE GLUCONATE 4 % EX LIQD: 60.0000 mL | Freq: Once | CUTANEOUS | Status: DC

## 2017-01-17 MED ORDER — MOMETASONE FURO-FORMOTEROL FUM 200-5 MCG/ACT IN AERO
2.0000 | INHALATION_SPRAY | Freq: Two times a day (BID) | RESPIRATORY_TRACT | Status: DC
  Filled 2017-01-17: qty 8.8

## 2017-01-17 MED ORDER — ATORVASTATIN CALCIUM 20 MG PO TABS: 20.0000 mg | ORAL_TABLET | ORAL | Status: DC

## 2017-01-17 MED ORDER — FINASTERIDE 5 MG PO TABS: 5.0000 mg | ORAL_TABLET | Freq: Every day | ORAL | Status: DC

## 2017-01-17 MED ORDER — DULOXETINE HCL 30 MG PO CPEP
90.0000 mg | ORAL_CAPSULE | Freq: Every day | ORAL | Status: DC
Start: 2017-01-18 — End: 2017-01-18

## 2017-01-17 MED ORDER — DULOXETINE HCL 30 MG PO CPEP: 30.0000 mg | ORAL_CAPSULE | Freq: Every day | ORAL | Status: DC

## 2017-01-17 MED ORDER — HYDROMORPHONE HCL 1 MG/ML IJ SOLN
1.0000 mg | INTRAMUSCULAR | Status: DC | PRN
Start: 2017-01-17 — End: 2017-01-18

## 2017-01-17 MED ORDER — CEFAZOLIN SODIUM-DEXTROSE 2-4 GM/100ML-% IV SOLN
2.0000 g | INTRAVENOUS | Status: AC
  Administered 2017-01-17: 2 g via INTRAVENOUS

## 2017-01-17 MED ORDER — METOCLOPRAMIDE HCL 5 MG PO TABS: 5.0000 mg | ORAL_TABLET | Freq: Three times a day (TID) | ORAL | Status: DC | PRN

## 2017-01-17 MED ORDER — ACETAMINOPHEN 325 MG PO TABS: 650.0000 mg | ORAL_TABLET | ORAL | Status: DC | PRN

## 2017-01-17 MED ORDER — TESTOSTERONE 30 MG/ACT TD SOLN: 1.0000 | Freq: Every day | TRANSDERMAL | Status: DC

## 2017-01-17 MED ORDER — DEXTROSE 5 % IV SOLN
INTRAVENOUS | Status: DC | PRN
  Administered 2017-01-17: 16:00:00 via INTRAVENOUS
  Administered 2017-01-17: 30 ug/min via INTRAVENOUS

## 2017-01-17 MED ORDER — SODIUM CHLORIDE 0.9 % IR SOLN
Status: DC | PRN
  Administered 2017-01-17 (×2): 1000 mL

## 2017-01-17 MED ORDER — SUGAMMADEX SODIUM 200 MG/2ML IV SOLN
INTRAVENOUS | Status: DC | PRN
  Administered 2017-01-17: 200 mg via INTRAVENOUS

## 2017-01-17 MED ORDER — GLIMEPIRIDE 2 MG PO TABS: 2.0000 mg | ORAL_TABLET | Freq: Every day | ORAL | Status: DC

## 2017-01-17 MED ORDER — VITAMIN C 500 MG PO TABS
500.0000 mg | ORAL_TABLET | Freq: Every day | ORAL | Status: DC
  Filled 2017-01-17: qty 1

## 2017-01-17 MED ORDER — ONDANSETRON HCL 4 MG/2ML IJ SOLN: 4.0000 mg | Freq: Once | INTRAMUSCULAR | Status: DC | PRN

## 2017-01-17 SURGICAL SUPPLY — 100 items
BASEPLATE GLENOSPHERE 25 (Plate) ×1 IMPLANT
BASEPLATE GLENOSPHERE 25MM (Plate) ×1 IMPLANT
BEARING HUMERAL E1 44-41 (Miscellaneous) ×1 IMPLANT
BEARING HUMERAL E1 44-41MM (Miscellaneous) ×1 IMPLANT
BIT DRILL F/CENTRAL SCRW 3.2 (BIT) ×1
BIT DRILL F/CENTRAL SCRW 3.2MM (BIT) IMPLANT
BIT DRILL TWIST 2.7 (BIT) ×1 IMPLANT
BIT DRILL TWIST 2.7MM (BIT) ×1
BONE VOID FILLER CERAMENT (Orthopedic Implant) ×2 IMPLANT
BOWL SMART MIX CTS (DISPOSABLE) IMPLANT
BRNG HUM STD 44-41 SHLDR E1 (Miscellaneous) ×1 IMPLANT
BRUSH FEMORAL CANAL (MISCELLANEOUS) IMPLANT
BUR SURG 4X8 MED (BURR) IMPLANT
BURR SURG 4MMX8MM MEDIUM (BURR)
BURR SURG 4X8 MED (BURR)
CEMENT BONE R 1X40 (Cement) ×2 IMPLANT
CEMENT BONE REFOBACIN R1X40 US (Cement) ×4 IMPLANT
CLOSURE WOUND 1/2 X4 (GAUZE/BANDAGES/DRESSINGS) ×1
COVER SURGICAL LIGHT HANDLE (MISCELLANEOUS) ×3 IMPLANT
DRAPE INCISE IOBAN 66X45 STRL (DRAPES) ×9 IMPLANT
DRAPE ORTHO SPLIT 77X108 STRL (DRAPES) ×6
DRAPE SURG ORHT 6 SPLT 77X108 (DRAPES) ×2 IMPLANT
DRAPE U-SHAPE 47X51 STRL (DRAPES) ×3 IMPLANT
DRAPE X-RAY CASS 24X20 (DRAPES) IMPLANT
DRILL BIT 5/64 (BIT) IMPLANT
DRILL BIT F/CENTRAL SCRW 3.2MM (BIT) ×3
DRSG ADAPTIC 3X8 NADH LF (GAUZE/BANDAGES/DRESSINGS) ×3 IMPLANT
DRSG PAD ABDOMINAL 8X10 ST (GAUZE/BANDAGES/DRESSINGS) ×6 IMPLANT
DURAPREP 26ML APPLICATOR (WOUND CARE) ×3 IMPLANT
ELECT BLADE 4.0 EZ CLEAN MEGAD (MISCELLANEOUS) ×3
ELECT NDL TIP 2.8 STRL (NEEDLE) ×1 IMPLANT
ELECT NEEDLE TIP 2.8 STRL (NEEDLE) ×3 IMPLANT
ELECT REM PT RETURN 9FT ADLT (ELECTROSURGICAL) ×3
ELECTRODE BLDE 4.0 EZ CLN MEGD (MISCELLANEOUS) ×1 IMPLANT
ELECTRODE REM PT RTRN 9FT ADLT (ELECTROSURGICAL) ×1 IMPLANT
GAUZE SPONGE 4X4 12PLY STRL (GAUZE/BANDAGES/DRESSINGS) ×3 IMPLANT
GAUZE SPONGE 4X4 12PLY STRL LF (GAUZE/BANDAGES/DRESSINGS) ×2 IMPLANT
GLENO SPHERE 3X41MM CVD (Joint) ×3 IMPLANT
GLENOSPHERE 3X41MM CVD (Joint) ×1 IMPLANT
GLOVE BIOGEL PI ORTHO PRO 7.5 (GLOVE) ×4
GLOVE BIOGEL PI ORTHO PRO SZ8 (GLOVE) ×2
GLOVE ORTHO TXT STRL SZ7.5 (GLOVE) ×3 IMPLANT
GLOVE PI ORTHO PRO STRL 7.5 (GLOVE) ×2 IMPLANT
GLOVE PI ORTHO PRO STRL SZ8 (GLOVE) ×1 IMPLANT
GLOVE SURG ORTHO 8.5 STRL (GLOVE) ×3 IMPLANT
GOWN STRL REUS W/ TWL LRG LVL3 (GOWN DISPOSABLE) ×1 IMPLANT
GOWN STRL REUS W/ TWL XL LVL3 (GOWN DISPOSABLE) ×4 IMPLANT
GOWN STRL REUS W/TWL LRG LVL3 (GOWN DISPOSABLE) ×3
GOWN STRL REUS W/TWL XL LVL3 (GOWN DISPOSABLE) ×12
HANDPIECE INTERPULSE COAX TIP (DISPOSABLE)
KIT BASIN OR (CUSTOM PROCEDURE TRAY) ×3 IMPLANT
KIT ROOM TURNOVER OR (KITS) ×3 IMPLANT
MANIFOLD NEPTUNE II (INSTRUMENTS) ×3 IMPLANT
NDL 1/2 CIR MAYO (NEEDLE) IMPLANT
NDL HYPO 25GX1X1/2 BEV (NEEDLE) ×1 IMPLANT
NDL SUT 6 .5 CRC .975X.05 MAYO (NEEDLE) ×1 IMPLANT
NEEDLE 1/2 CIR MAYO (NEEDLE) IMPLANT
NEEDLE HYPO 25GX1X1/2 BEV (NEEDLE) ×3 IMPLANT
NEEDLE MAYO TAPER (NEEDLE) ×3
NS IRRIG 1000ML POUR BTL (IV SOLUTION) ×3 IMPLANT
PACK SHOULDER (CUSTOM PROCEDURE TRAY) ×3 IMPLANT
PAD ABD 8X10 STRL (GAUZE/BANDAGES/DRESSINGS) ×2 IMPLANT
PAD ARMBOARD 7.5X6 YLW CONV (MISCELLANEOUS) ×6 IMPLANT
PEN SKIN MARKING BROAD (MISCELLANEOUS) ×2 IMPLANT
PIN THREADED REVERSE (PIN) ×2 IMPLANT
SCREW BONE CORT 6.5X35MM (Screw) ×2 IMPLANT
SCREW BONE LOCKING 4.75X30X3.5 (Screw) ×3 IMPLANT
SCREW LOCKING NS 4.75MMX20MM (Screw) ×4 IMPLANT
SET HNDPC FAN SPRY TIP SCT (DISPOSABLE) IMPLANT
SLING ARM FOAM STRAP LRG (SOFTGOODS) ×2 IMPLANT
SLING ARM IMMOBILIZER LRG (SOFTGOODS) ×3 IMPLANT
SLING ARM IMMOBILIZER MED (SOFTGOODS) IMPLANT
SPONGE LAP 18X18 X RAY DECT (DISPOSABLE) ×3 IMPLANT
SPONGE LAP 4X18 X RAY DECT (DISPOSABLE) ×3 IMPLANT
STAPLER VISISTAT 35W (STAPLE) ×2 IMPLANT
STEM HUMERAL STRL 12MMX140MM (Stem) ×2 IMPLANT
STRIP CLOSURE SKIN 1/2X4 (GAUZE/BANDAGES/DRESSINGS) ×2 IMPLANT
SUCTION FRAZIER HANDLE 10FR (MISCELLANEOUS) ×2
SUCTION TUBE FRAZIER 10FR DISP (MISCELLANEOUS) ×1 IMPLANT
SUT FIBERWIRE #2 38 T-5 BLUE (SUTURE) ×15
SUT MNCRL AB 4-0 PS2 18 (SUTURE) ×3 IMPLANT
SUT PDS AB 1 CT  36 (SUTURE) ×2
SUT PDS AB 1 CT 36 (SUTURE) ×1 IMPLANT
SUT VIC AB 0 CT1 27 (SUTURE) ×3
SUT VIC AB 0 CT1 27XBRD ANBCTR (SUTURE) ×1 IMPLANT
SUT VIC AB 2-0 CT1 27 (SUTURE) ×3
SUT VIC AB 2-0 CT1 TAPERPNT 27 (SUTURE) ×1 IMPLANT
SUT VICRYL AB 2 0 TIES (SUTURE) ×3 IMPLANT
SUTURE FIBERWR #2 38 T-5 BLUE (SUTURE) ×5 IMPLANT
SYR CONTROL 10ML LL (SYRINGE) ×3 IMPLANT
SYR TOOMEY 50ML (SYRINGE) ×3 IMPLANT
SYSTEM VACUUM CEMENT MIXING (MISCELLANEOUS) ×2 IMPLANT
TOWEL OR 17X24 6PK STRL BLUE (TOWEL DISPOSABLE) ×3 IMPLANT
TOWEL OR 17X26 10 PK STRL BLUE (TOWEL DISPOSABLE) ×3 IMPLANT
TOWER CARTRIDGE SMART MIX (DISPOSABLE) IMPLANT
TRAY BONE MOLDING CALCIGEN (MISCELLANEOUS) ×2 IMPLANT
TRAY FOLEY W/METER SILVER 16FR (SET/KITS/TRAYS/PACK) ×3 IMPLANT
TRAY HUM STD 44MM (Orthopedic Implant) ×2 IMPLANT
WATER STERILE IRR 1000ML POUR (IV SOLUTION) ×3 IMPLANT
YANKAUER SUCT BULB TIP NO VENT (SUCTIONS) IMPLANT

## 2017-01-17 NOTE — Transfer of Care (Signed)
Immediate Anesthesia Transfer of Care Note  Patient: Frederick Guzman  Procedure(s) Performed: LEFT SHOULDER REVISION REVERSE TOTAL SHOULDER ARTHROPLASTY (Left Shoulder)  Patient Location: PACU  Anesthesia Type:GA combined with regional for post-op pain  Level of Consciousness: alert , oriented, drowsy and patient cooperative  Airway & Oxygen Therapy: Patient Spontanous Breathing and Patient connected to nasal cannula oxygen  Post-op Assessment: Report given to RN and Post -op Vital signs reviewed and stable  Post vital signs: Reviewed and stable  Last Vitals:  Vitals:   01/17/17 1114 01/17/17 1631  BP: (!) 138/95 115/66  Pulse: 71 (!) 107  Resp: 18 19  Temp: 36.6 C (!) 36.3 C  SpO2: 100% 97%    Last Pain:  Vitals:   01/17/17 1139  TempSrc:   PainSc: 6       Patients Stated Pain Goal: 3 (75/30/05 1102)  Complications: No apparent anesthesia complications

## 2017-01-17 NOTE — Brief Op Note (Signed)
01/17/2017  4:28 PM  PATIENT:  Frederick Guzman  66 y.o. male  PRE-OPERATIVE DIAGNOSIS:  left shoulder infection, s/p implant removal with antibiotic spacer  POST-OPERATIVE DIAGNOSIS:  left shoulder infection, s/p implant removal with antibiotic spacer  PROCEDURE:  Procedure(s): LEFT SHOULDER REVISION REVERSE TOTAL SHOULDER ARTHROPLASTY (Left) Biomet Comprehensive Reverse TSA  SURGEON:  Surgeon(s) and Role:    Netta Cedars, MD - Primary    * Stann Mainland Elly Modena, MD - Assisting  PHYSICIAN ASSISTANT:   ASSISTANTS: Ventura Bruns, PA-C   ANESTHESIA:   regional and general  EBL:  250 mL   BLOOD ADMINISTERED:  1 liter Albumin  DRAINS: none   LOCAL MEDICATIONS USED:  MARCAINE     SPECIMEN:  Source of Specimen:  shoulder soft tissue, deep, from shoulder joint  DISPOSITION OF SPECIMEN:  micro  COUNTS:  YES  TOURNIQUET:  * No tourniquets in log *  DICTATION: .Other Dictation: Dictation Number [id number]  PLAN OF CARE: Admit to inpatient   PATIENT DISPOSITION:  PACU - hemodynamically stable.   Delay start of Pharmacological VTE agent (>24hrs) due to surgical blood loss or risk of bleeding: not applicable

## 2017-01-17 NOTE — Anesthesia Procedure Notes (Addendum)
Procedure Name: Intubation Date/Time: 01/17/2017 1:36 PM Performed by: Scheryl Darter Pre-anesthesia Checklist: Patient identified, Emergency Drugs available, Suction available and Patient being monitored Patient Re-evaluated:Patient Re-evaluated prior to induction Oxygen Delivery Method: Circle System Utilized Preoxygenation: Pre-oxygenation with 100% oxygen Induction Type: IV induction Ventilation: Mask ventilation without difficulty Laryngoscope Size: Mac and 4 Grade View: Grade II Tube type: Oral Tube size: 8.0 mm Number of attempts: 1 Airway Equipment and Method: Stylet and Oral airway Placement Confirmation: ETT inserted through vocal cords under direct vision,  positive ETCO2 and breath sounds checked- equal and bilateral Secured at: 23 cm Tube secured with: Tape Dental Injury: Teeth and Oropharynx as per pre-operative assessment

## 2017-01-17 NOTE — Anesthesia Procedure Notes (Signed)
Anesthesia Regional Block: Interscalene brachial plexus block   Pre-Anesthetic Checklist: ,, timeout performed, Correct Patient, Correct Site, Correct Laterality, Correct Procedure, Correct Position, site marked, Risks and benefits discussed,  Surgical consent,  Pre-op evaluation,  At surgeon's request and post-op pain management  Laterality: Left  Prep: chloraprep       Needles:   Needle Type: Echogenic Stimulator Needle     Needle Length: 9cm  Needle Gauge: 22     Additional Needles:   Procedures:,,,, ultrasound used (permanent image in chart),,,,  Narrative:  Start time: 01/17/2017 12:05 PM End time: 01/17/2017 12:15 PM Injection made incrementally with aspirations every 5 mL.  Performed by: Personally   Additional Notes: 20 cc 0.5% Bupivacaine with 1:200 epi injected easily

## 2017-01-17 NOTE — Interval H&P Note (Signed)
History and Physical Interval Note:  01/17/2017 1:15 PM  Frederick Guzman  has presented today for surgery, with the diagnosis of left shoulder infection  The various methods of treatment have been discussed with the patient and family. After consideration of risks, benefits and other options for treatment, the patient has consented to  Procedure(s): LEFT SHOULDER REVISION REVERSE TOTAL SHOULDER ARTHROPLASTY (Left) as a surgical intervention .  The patient's history has been reviewed, patient examined, no change in status, stable for surgery.  I have reviewed the patient's chart and labs.  Questions were answered to the patient's satisfaction.     Frederick Guzman,STEVEN R

## 2017-01-17 NOTE — Anesthesia Preprocedure Evaluation (Signed)
Anesthesia Evaluation  Patient identified by MRN, date of birth, ID band Patient awake    Reviewed: Allergy & Precautions, NPO status , Patient's Chart, lab work & pertinent test results  Airway Mallampati: II  TM Distance: >3 FB Neck ROM: Full    Dental  (+) Teeth Intact, Dental Advisory Given   Pulmonary Current Smoker,    breath sounds clear to auscultation       Cardiovascular hypertension,  Rhythm:Regular Rate:Normal     Neuro/Psych    GI/Hepatic   Endo/Other  diabetes  Renal/GU      Musculoskeletal   Abdominal   Peds  Hematology   Anesthesia Other Findings   Reproductive/Obstetrics                             Anesthesia Physical Anesthesia Plan  ASA: III  Anesthesia Plan: General and Regional   Post-op Pain Management:    Induction: Intravenous  PONV Risk Score and Plan: Ondansetron and Dexamethasone  Airway Management Planned: Oral ETT  Additional Equipment:   Intra-op Plan:   Post-operative Plan: Extubation in OR  Informed Consent: I have reviewed the patients History and Physical, chart, labs and discussed the procedure including the risks, benefits and alternatives for the proposed anesthesia with the patient or authorized representative who has indicated his/her understanding and acceptance.   Dental advisory given  Plan Discussed with: CRNA and Anesthesiologist  Anesthesia Plan Comments:         Anesthesia Quick Evaluation

## 2017-01-17 NOTE — Anesthesia Postprocedure Evaluation (Signed)
Anesthesia Post Note  Patient: KAUSHAL VANNICE  Procedure(s) Performed: LEFT SHOULDER REVISION REVERSE TOTAL SHOULDER ARTHROPLASTY (Left Shoulder)     Patient location during evaluation: PACU Anesthesia Type: Regional Level of consciousness: awake, awake and alert and oriented Pain management: pain level controlled Respiratory status: spontaneous breathing, nonlabored ventilation and respiratory function stable Cardiovascular status: blood pressure returned to baseline Anesthetic complications: no    Last Vitals:  Vitals:   01/17/17 1815 01/17/17 1830  BP: 131/70 120/75  Pulse: 86 84  Resp: 18 18  Temp:    SpO2: 96% 99%    Last Pain:  Vitals:   01/17/17 1815  TempSrc:   PainSc: 0-No pain                 Tayjah Lobdell COKER

## 2017-01-18 LAB — BASIC METABOLIC PANEL
Anion gap: 10 (ref 5–15)
BUN: 7 mg/dL (ref 6–20)
CALCIUM: 8.6 mg/dL — AB (ref 8.9–10.3)
CO2: 24 mmol/L (ref 22–32)
Chloride: 104 mmol/L (ref 101–111)
Creatinine, Ser: 1 mg/dL (ref 0.61–1.24)
GFR calc Af Amer: >60 mL/min (ref >60)
GLUCOSE: 102 mg/dL — AB (ref 65–99)
Potassium: 3.9 mmol/L (ref 3.5–5.1)
SODIUM: 138 mmol/L (ref 135–145)

## 2017-01-18 LAB — GLUCOSE, CAPILLARY: GLUCOSE-CAPILLARY: 147 mg/dL — AB (ref 65–99)

## 2017-01-18 LAB — HEMOGLOBIN AND HEMATOCRIT, BLOOD
HEMATOCRIT: 35.8 % — AB (ref 39.0–52.0)
HEMOGLOBIN: 12 g/dL — AB (ref 13.0–17.0)

## 2017-01-18 NOTE — Progress Notes (Signed)
Orthopedics Progress Note  Subjective: Patient feeling better this morning, ready for discharge  Objective:  Vitals:   01/18/17 0327 01/18/17 0747  BP: 113/73 115/75  Pulse: 87 90  Resp: 16 16  Temp: 98.5 F (36.9 C) 98.6 F (37 C)  SpO2: 95% 93%    General: Awake and alert  Musculoskeletal: left shoulder bandage in place, minimal pain with gentle ROM, NVI distally, min swelling Neurovascularly intact  Lab Results  Component Value Date   WBC 7.8 01/12/2017   HGB 12.0 (L) 01/18/2017   HCT 35.8 (L) 01/18/2017   MCV 90.0 01/12/2017   PLT 224 01/12/2017       Component Value Date/Time   NA 138 01/18/2017 0512   K 3.9 01/18/2017 0512   CL 104 01/18/2017 0512   CO2 24 01/18/2017 0512   GLUCOSE 102 (H) 01/18/2017 0512   BUN 7 01/18/2017 0512   CREATININE 1.00 01/18/2017 0512   CREATININE 1.02 10/19/2016 0921   CALCIUM 8.6 (L) 01/18/2017 0512   GFRNONAA >60 01/18/2017 0512   GFRNONAA 76 10/19/2016 0921   GFRAA >60 01/18/2017 0512   GFRAA 88 10/19/2016 0921    Lab Results  Component Value Date   INR 1.19 07/13/2015    Assessment/Plan: POD #1 s/p Procedure(s): LEFT SHOULDER REVISION REVERSE TOTAL SHOULDER ARTHROPLASTY OT, then discharge home with home exercises. Follow up in two weeks  Doran Heater. Veverly Fells, MD 01/18/2017 7:53 AM

## 2017-01-18 NOTE — Anesthesia Postprocedure Evaluation (Signed)
Anesthesia Post Note  Patient: Frederick Guzman  Procedure(s) Performed: LEFT SHOULDER REVISION REVERSE TOTAL SHOULDER ARTHROPLASTY (Left Shoulder)     Patient location during evaluation: PACU Anesthesia Type: Regional and General Level of consciousness: awake, awake and alert and oriented Pain management: pain level controlled Vital Signs Assessment: post-procedure vital signs reviewed and stable Respiratory status: spontaneous breathing, nonlabored ventilation and respiratory function stable Cardiovascular status: blood pressure returned to baseline Anesthetic complications: no    Last Vitals:  Vitals:   01/18/17 0327 01/18/17 0747  BP: 113/73 115/75  Pulse: 87 90  Resp: 16 16  Temp: 36.9 C 37 C  SpO2: 95% 93%    Last Pain:  Vitals:   01/18/17 0652  TempSrc:   PainSc: 7                  Gracious Renken COKER

## 2017-01-18 NOTE — Evaluation (Signed)
Occupational Therapy Evaluation Patient Details Name: Frederick Guzman MRN: [id number] DOB: 07-29-1950 Today's Date: 01/18/2017    History of Present Illness  66 y.o. male underwent left total shoulder replacement on 01/17/17.  PMH including COPD, cancer, DM, HTN, fibromyalgia, and several previous L total shoulder sx.    Clinical Impression   PTA, pt was living alone and was independent. Pt reporting that his significant other will be staying with him at dc to assist with ADLs/IADLs as needed. Pt currently requiring Min A for UB ADLs and Min Guard for LB ADLs and functional mobility. Pt demonstrating understanding of LUE exercises and ROM limitations. Provided pt with all education and handout to increase carry over to home. Recommend dc home once medically stable per physician with initial 24 hour supervision. All acute OT needs met and will sign off.     Follow Up Recommendations  No OT follow up;Supervision/Assistance - 24 hour    Equipment Recommendations  None recommended by OT    Recommendations for Other Services       Precautions / Restrictions Precautions Precautions: Shoulder Type of Shoulder Precautions: Active protocal. Forward flexion 0-90; abduction 0-60; external rotation 0-30 Shoulder Interventions: Shoulder sling/immobilizer;At all times;Off for dressing/bathing/exercises Precaution Booklet Issued: Yes (comment) Precaution Comments: Reviewed all shoulder precautions, excercises, and ROM for ADLs.  Restrictions Weight Bearing Restrictions: Yes LUE Weight Bearing: Non weight bearing Other Position/Activity Restrictions: No lifting of LUE      Mobility Bed Mobility Overal bed mobility: Modified Independent             General bed mobility comments: Increased time  Transfers Overall transfer level: Needs assistance Equipment used: None Transfers: Sit to/from Stand Sit to Stand: Min guard         General transfer comment: Min Guard for safety     Balance Overall balance assessment: No apparent balance deficits (not formally assessed)                                         ADL either performed or assessed with clinical judgement   ADL Overall ADL's : Needs assistance/impaired Eating/Feeding: Set up;Sitting   Grooming: Supervision/safety;Set up;Standing   Upper Body Bathing: Minimal assistance;Sitting Upper Body Bathing Details (indicate cue type and reason): Educated pt on UB bathing techniques. Pt verbalizing understanding Lower Body Bathing: Min guard;Sit to/from stand   Upper Body Dressing : Minimal assistance;Sitting Upper Body Dressing Details (indicate cue type and reason): Minimal assistance to don shirt over elbow. Pt demonstrating good technique. VCs to remember ROM precautions.  Min A for correct positioning of sling Lower Body Dressing: Min guard;Sit to/from stand Lower Body Dressing Details (indicate cue type and reason): Min Guard in standing Toilet Transfer: Min guard;Ambulation (simulated in room)           Functional mobility during ADLs: Min guard General ADL Comments: Pt performing UB ADLs with Min A and other ADLs with Min guard A.      Vision         Perception     Praxis      Pertinent Vitals/Pain Pain Assessment: Faces Faces Pain Scale: Hurts little more Pain Location: L shoulder Pain Descriptors / Indicators: Discomfort;Grimacing Pain Intervention(s): Monitored during session;Repositioned     Hand Dominance Right   Extremity/Trunk Assessment Upper Extremity Assessment Upper Extremity Assessment: LUE deficits/detail LUE Deficits / Details: s/p shoulder sx LUE Coordination:  decreased gross motor   Lower Extremity Assessment Lower Extremity Assessment: Overall WFL for tasks assessed   Cervical / Trunk Assessment Cervical / Trunk Assessment: Normal   Communication Communication Communication: No difficulties   Cognition Arousal/Alertness: Awake/alert Behavior  During Therapy: WFL for tasks assessed/performed Overall Cognitive Status: Within Functional Limits for tasks assessed                                     General Comments  Pt reportign that he will have his significant other present at dc to assist with ADLs     Exercises Exercises: Shoulder Shoulder Exercises Shoulder Flexion: AROM;Left;10 reps;Supine Shoulder Extension: AROM;Left;10 reps;Supine Shoulder ABduction: AROM;Left;10 reps;Seated Shoulder External Rotation: AROM;Left;10 reps;Seated Elbow Flexion: AROM;Left;10 reps;Seated Elbow Extension: AROM;Left;10 reps;Seated Wrist Flexion: AROM;Left;10 reps;Seated Wrist Extension: AROM;Left;10 reps;Seated Digit Composite Flexion: AROM;Left;10 reps;Seated Composite Extension: AROM;Left;10 reps;Seated Neck Flexion:  (Educated pt on neck exercises) Neck Extension:  (Educated pt on neck exercises) Neck Lateral Flexion - Right:  (Educated pt on neck exercises) Neck Lateral Flexion - Left:  (Educated pt on neck exercises)   Shoulder Instructions Shoulder Instructions Donning/doffing shirt without moving shoulder: Minimal assistance Method for sponge bathing under operated UE: Min-guard Donning/doffing sling/immobilizer: Minimal assistance Correct positioning of sling/immobilizer: Set-up;Supervision/safety ROM for elbow, wrist and digits of operated UE: Set-up;Supervision/safety Sling wearing schedule (on at all times/off for ADL's): Set-up;Supervision/safety Proper positioning of operated UE when showering: Set-up;Supervision/safety Positioning of UE while sleeping: Set-up;Supervision/safety    Home Living Family/patient expects to be discharged to:: Private residence Living Arrangements: Spouse/significant other Available Help at Discharge: Family;Available 24 hours/day Type of Home: House       Home Layout: Two level;Able to live on main level with bedroom/bathroom     Bathroom Shower/Tub: Walk-in shower    Bathroom Toilet: Handicapped height     Home Equipment: Environmental consultant - 2 wheels;Crutches          Prior Functioning/Environment Level of Independence: Independent                 OT Problem List: Decreased range of motion;Decreased activity tolerance;Decreased knowledge of use of DME or AE;Decreased knowledge of precautions;Pain;Impaired UE functional use      OT Treatment/Interventions:      OT Goals(Current goals can be found in the care plan section) Acute Rehab OT Goals Patient Stated Goal: Go home today OT Goal Formulation: With patient Time For Goal Achievement: 02/01/17 Potential to Achieve Goals: Good  OT Frequency:     Barriers to D/C:            Co-evaluation              AM-PAC PT "6 Clicks" Daily Activity     Outcome Measure Help from another person eating meals?: None Help from another person taking care of personal grooming?: A Little Help from another person toileting, which includes using toliet, bedpan, or urinal?: A Little Help from another person bathing (including washing, rinsing, drying)?: A Little Help from another person to put on and taking off regular upper body clothing?: A Little Help from another person to put on and taking off regular lower body clothing?: A Little 6 Click Score: 19   End of Session Equipment Utilized During Treatment: Other (comment) (Sling) Nurse Communication: Mobility status;Weight bearing status;Precautions  Activity Tolerance: Patient tolerated treatment well Patient left: in bed;with call bell/phone within reach (at EOB)  OT Visit Diagnosis:  Pain Pain - Right/Left: Left Pain - part of body: Shoulder                Time: 2863-8177 OT Time Calculation (min): 29 min Charges:  OT General Charges $OT Visit: 1 Visit OT Evaluation $OT Eval Moderate Complexity: 1 Mod OT Treatments $Self Care/Home Management : 8-22 mins G-Codes:     [place], OTR/L Acute Rehab Pager: [phone] Office:  [place] 01/18/2017, 9:42 AM

## 2017-01-18 NOTE — Discharge Summary (Signed)
Physician Discharge Summary    Patient ID: Frederick Guzman MRN: [id number] DOB/AGE: 07/18/1950 66 y.o.  Admit date: 01/17/2017 Discharge date:  01/18/2017  Procedures:  Procedure(s) (LRB): LEFT SHOULDER REVISION REVERSE TOTAL SHOULDER ARTHROPLASTY (Left)  Attending Physician:  Dr. Esmond Plants  Admission Diagnoses:   Left shoulder infection after total shoulder replacement, s/p implant removal  Consultants:  OT  Discharge Diagnoses:  Active Problems:   S/P shoulder replacement, left  Past Medical History:  Diagnosis Date  . Arthritis    OA  . Asthma   . Cancer ([place]) 08/2014   melanoma on R shoulder & back - [facility]   . COPD (chronic obstructive pulmonary disease) ([place])   . Depression    pt. denies- 01/2016  . Diabetes mellitus without complication ([place])   . Diverticulitis   . Dyspnea    with exertion  . Fibromyalgia   . GERD (gastroesophageal reflux disease)   . Hyperlipemia   . Hypertension    "never was high"  . Pneumonia 06/2015  . Sleep apnea    last study - Nov. 2017, waiting for a new machine, using the old CPAP q night after cleaning the device after he was documented to have pneumonia from his CPAP machine.      PCP: [name], DO   Discharged Condition: good  Hospital Course:  Patient underwent the above stated procedure on 01/17/2017. Patient tolerated the procedure well and brought to the recovery room in good condition and subsequently to the floor. No complications during their hospital stay. No complicating wound issues during the hospital stay. Incision healing well and good early range of motion  Disposition: 01-Home or Self Care with follow up in 2 weeks  Active motion as tolerated for gentle ADLs  Medications: Current Facility-Administered Medications  Medication Dose Route Frequency Provider Last Rate Last Dose  . 0.9 %  sodium chloride infusion   Intravenous Continuous Netta Cedars, MD 75 mL/hr at 01/17/17 2027    .  acetaminophen (TYLENOL) tablet 650 mg  650 mg Oral Q4H PRN Netta Cedars, MD       Or  . acetaminophen (TYLENOL) suppository 650 mg  650 mg Rectal Q4H PRN Netta Cedars, MD      . aspirin EC tablet 81 mg  81 mg Oral Cathlean Sauer, MD      . atorvastatin (LIPITOR) tablet 20 mg  20 mg Oral Cathlean Sauer, MD      . bisacodyl (DULCOLAX) suppository 10 mg  10 mg Rectal Daily PRN Netta Cedars, MD      . buPROPion (WELLBUTRIN XL) 24 hr tablet 150 mg  150 mg Oral Daily Netta Cedars, MD      . ceFAZolin (ANCEF) IVPB 2g/100 mL premix  2 g Intravenous Rainey Pines, MD 200 mL/hr at 01/18/17 0140 2 g at 01/18/17 0140  . docusate sodium (COLACE) capsule 100 mg  100 mg Oral BID Netta Cedars, MD   100 mg at 01/17/17 2120  . doxycycline (VIBRA-TABS) tablet 100 mg  100 mg Oral BID Netta Cedars, MD      . DULoxetine (CYMBALTA) DR capsule 90 mg  90 mg Oral Daily Netta Cedars, MD      . finasteride (PROSCAR) tablet 5 mg  5 mg Oral Donavan Burnet, MD      . fluticasone [facility]) 50 MCG/ACT nasal spray 1 spray  1 spray Each Nare Daily PRN Netta Cedars, MD      . glimepiride (AMARYL) tablet  2 mg  2 mg Oral Daily Netta Cedars, MD      . HYDROmorphone (DILAUDID) injection 1-2 mg  1-2 mg Intravenous Q2H PRN Netta Cedars, MD      . ibuprofen (ADVIL,MOTRIN) tablet 600 mg  600 mg Oral Q6H PRN Netta Cedars, MD      . insulin aspart (novoLOG) injection 0-15 Units  0-15 Units Subcutaneous TID WC Netta Cedars, MD      . insulin aspart (novoLOG) injection 0-5 Units  0-5 Units Subcutaneous QHS Netta Cedars, MD   2 Units at 01/17/17 2118  . insulin aspart (novoLOG) injection 4 Units  4 Units Subcutaneous TID WC Netta Cedars, MD      . ipratropium-albuterol (DUONEB) 0.5-2.5 (3) MG/3ML nebulizer solution 3 mL  3 mL Nebulization Q6H PRN Netta Cedars, MD      . lisinopril (PRINIVIL,ZESTRIL) tablet 2.5 mg  2.5 mg Oral Donavan Burnet, MD      . loratadine (CLARITIN) tablet 10 mg  10 mg Oral Daily  Netta Cedars, MD      . menthol-cetylpyridinium (CEPACOL) lozenge 3 mg  1 lozenge Oral PRN Netta Cedars, MD       Or  . phenol (CHLORASEPTIC) mouth spray 1 spray  1 spray Mouth/Throat PRN Netta Cedars, MD   1 spray at 01/18/17 0200  . methocarbamol (ROBAXIN) tablet 500 mg  500 mg Oral Q6H PRN Netta Cedars, MD   500 mg at 01/18/17 1093   Or  . methocarbamol (ROBAXIN) 500 mg in dextrose 5 % 50 mL IVPB  500 mg Intravenous Q6H PRN Netta Cedars, MD      . metoCLOPramide (REGLAN) tablet 5-10 mg  5-10 mg Oral Q8H PRN Netta Cedars, MD       Or  . metoCLOPramide (REGLAN) injection 5-10 mg  5-10 mg Intravenous Q8H PRN Netta Cedars, MD      . mometasone-formoterol [facility]) 200-5 MCG/ACT inhaler 2 puff  2 puff Inhalation BID Netta Cedars, MD      . multivitamin with minerals tablet 1 tablet  1 tablet Oral Donavan Burnet, MD      . ondansetron [facility]) tablet 4 mg  4 mg Oral Q6H PRN Netta Cedars, MD       Or  . ondansetron [facility]) injection 4 mg  4 mg Intravenous Q6H PRN Netta Cedars, MD      . oxyCODONE (Oxy IR/ROXICODONE) immediate release tablet 10 mg  10 mg Oral Q3H PRN Netta Cedars, MD   10 mg at 01/18/17 2355  . oxyCODONE (Oxy IR/ROXICODONE) immediate release tablet 5 mg  5 mg Oral Q3H PRN Netta Cedars, MD   5 mg at 01/17/17 2121  . pantoprazole (PROTONIX) EC tablet 40 mg  40 mg Oral Daily Netta Cedars, MD      . polyethylene glycol [facility] / GLYCOLAX) packet 17 g  17 g Oral Daily PRN Netta Cedars, MD      . tamsulosin [facility]) capsule 0.4 mg  0.4 mg Oral BID Netta Cedars, MD      . vitamin C (ASCORBIC ACID) tablet 500 mg  500 mg Oral Daily Netta Cedars, MD        Follow-up Information    Netta Cedars, MD. Call in 2 weeks.   Specialty:  Orthopedic Surgery Why:  [phone] Contact information: [address] [place] [address] [phone]           Discharge Instructions    Call MD / Call 911  Complete by:  As directed    If you  experience chest pain or shortness of breath, CALL 911 and be transported to the hospital emergency room.  If you develope a fever above 101 F, pus (white drainage) or increased drainage or redness at the wound, or calf pain, call your surgeon's office.   Constipation Prevention    Complete by:  As directed    Drink plenty of fluids.  Prune juice may be helpful.  You may use a stool softener, such as Colace (over the counter) 100 mg twice a day.  Use MiraLax (over the counter) for constipation as needed.   Diet - low sodium heart healthy    Complete by:  As directed    Increase activity slowly as tolerated    Complete by:  As directed       Allergies as of 01/18/2017      Reactions   Methotrexate Derivatives Nausea And Vomiting, Other (See Comments)   Mood change   Ultram [tramadol Hcl] Nausea And Vomiting      Medication List    STOP taking these medications   dapagliflozin propanediol 5 MG Tabs tablet Commonly known as:  FARXIGA   oxyCODONE-acetaminophen 7.5-325 MG tablet Commonly known as:  PERCOCET Replaced by:  oxyCODONE-acetaminophen 5-325 MG tablet     TAKE these medications   aspirin EC 81 MG tablet Take 81 mg by mouth every other day.   atorvastatin 20 MG tablet Commonly known as:  LIPITOR Take 1 tablet (20 mg total) by mouth every other day.   b complex vitamins tablet Take 1 tablet by mouth daily.   budesonide-formoterol 160-4.5 MCG/ACT inhaler Commonly known as:  SYMBICORT Inhale 2 puffs into the lungs 2 (two) times daily.   buPROPion 150 MG 24 hr tablet Commonly known as:  WELLBUTRIN XL Take 1 tablet (150 mg total) by mouth daily.   cetirizine 10 MG tablet Commonly known as:  ZYRTEC Take 10 mg by mouth daily as needed for allergies (seasonal allergies).   doxycycline 100 MG tablet Commonly known as:  VIBRA-TABS Take 1 tablet (100 mg total) by mouth 2 (two) times daily.   DULoxetine 60 MG capsule Commonly known as:  CYMBALTA Take 1 capsule (60 mg  total) by mouth daily. What changed:  additional instructions   DULoxetine 30 MG capsule Commonly known as:  CYMBALTA Take 1 capsule (30 mg total) by mouth daily. What changed:  additional instructions   esomeprazole 40 MG capsule Commonly known as:  NEXIUM Take 40 mg by mouth daily.   finasteride 5 MG tablet Commonly known as:  PROSCAR Take 5 mg by mouth at bedtime.   Fish Oil 1000 MG Caps Take 1,000 mg by mouth at bedtime.   fluticasone 50 MCG/ACT nasal spray Commonly known as:  FLONASE Place 1 spray into both nostrils daily as needed for allergies.   freestyle lancets Use as instructed up to tid   glimepiride 2 MG tablet Commonly known as:  AMARYL TAKE 1 TABLET BY MOUTH ONCE DAILY   glucose blood test strip Use up to 4 times per day as directed with glucometer. Disp: 100. Refill x99 What changed:  additional instructions   ibuprofen 200 MG tablet Commonly known as:  ADVIL,MOTRIN Take 600-800 mg by mouth every 6 (six) hours as needed for moderate pain (depends on pain if takes 3-4 tablets).   ipratropium-albuterol 0.5-2.5 (3) MG/3ML Soln Commonly known as:  DUONEB Take 3 mLs by nebulization every 6 (six) hours as  needed. What changed:  reasons to take this   lisinopril 2.5 MG tablet Commonly known as:  PRINIVIL,ZESTRIL Take 1 tablet (2.5 mg total) by mouth at bedtime.   methocarbamol 500 MG tablet Commonly known as:  ROBAXIN Take 1 tablet (500 mg total) by mouth 3 (three) times daily as needed.   multivitamin with minerals Tabs tablet Take 1 tablet by mouth at bedtime.   oxyCODONE-acetaminophen 5-325 MG tablet Commonly known as:  ROXICET Take 1-2 tablets by mouth every 4 (four) hours as needed for severe pain. Replaces:  oxyCODONE-acetaminophen 7.5-325 MG tablet   PROAIR HFA 108 (90 Base) MCG/ACT inhaler Generic drug:  albuterol Inhale 2 puffs into the lungs every 6 (six) hours as needed for wheezing or shortness of breath.   sildenafil 20 MG  tablet Commonly known as:  REVATIO TAKE 4 TABLETS AS NEEDED FOR ED   tamsulosin 0.4 MG Caps capsule Commonly known as:  FLOMAX Take 0.4 mg by mouth 2 (two) times daily.   Testosterone 30 MG/ACT Soln Commonly known as:  AXIRON Alternate every other day, one pump under one arm and then one pump under both arms What changed:  how much to take  how to take this  when to take this  additional instructions   varenicline 1 MG tablet Commonly known as:  CHANTIX CONTINUING MONTH PAK Take 1 tablet (1 mg total) by mouth 2 (two) times daily.   varenicline 0.5 MG X 11 & 1 MG X 42 tablet Commonly known as:  CHANTIX STARTING MONTH PAK Take one 0.5 mg tablet by mouth once daily for 3 days, then increase to one 0.5 mg tablet twice daily for 4 days, then increase to one 1 mg tablet twice daily.   vitamin C 500 MG tablet Commonly known as:  ASCORBIC ACID Take 500 mg by mouth daily.       Signed: Adryen Cookson,STEVEN R 01/18/2017, 7:55 AM

## 2017-01-18 NOTE — Progress Notes (Signed)
Patient alert and oriented, mae's well, voiding adequate amount of urine, swallowing without difficulty, no c/o pain at time of discharge. Patient discharged home with family. Script and discharged instructions given to patient. Patient and family stated understanding of instructions given. Patient has an appointment with Dr. Norris 

## 2017-01-18 NOTE — Discharge Instructions (Signed)
Ice to the shoulder as much as possible. Keep a pillow propped behind the left elbow to keep the arm positioned across your waist.   Do not push, pull, or lift with the left arm.  Ok to use the hand for light daily activities like eating or dressing.  Keep the incision clean and dry and covered for one week, then ok to shower and get it wet.  Continue your oral antibiotics per Infectious Disease  Do exercises every hour while awake for 5 minutes.  Follow up with Dr Veverly Fells in two weeks in the office [phone].

## 2017-01-18 NOTE — Op Note (Signed)
NAME:  Frederick Guzman, Frederick Guzman                ACCOUNT NO.:  MEDICAL RECORD NO.:  [id number]  LOCATION:                                 FACILITY:  PHYSICIAN:  Doran Heater. Veverly Fells, M.D. DATE OF BIRTH:  02/03/1951  DATE OF PROCEDURE:  01/17/2017 DATE OF DISCHARGE:                              OPERATIVE REPORT   POSTOPERATIVE DIAGNOSIS:  Left shoulder prosthetic infection, status post implant removal with antibiotic spacer placement.  POSTOPERATIVE DIAGNOSIS:  Left shoulder prosthetic infection, status post implant removal with antibiotic spacer placement.  PROCEDURES PERFORMED:  Left shoulder revision, total shoulder arthroplasty with removal of antibiotic spacer, and placement of Biomet comprehensive reverse total shoulder arthroplasty.  ATTENDING SURGEON:  Doran Heater. Veverly Fells, MD.  ASSISTANTS:  First assist was Victorino December, MD.  Second assistant was Darol Destine, [facility].  Both Dr. Stann Mainland and Darol Destine, [facility], were scrubbed in during the entire procedure and necessary for satisfactory completion of surgery.  ANESTHESIA:  General anesthesia plus regional anesthesia was used that was interscalene block.  ESTIMATED BLOOD LOSS:  250 mL.  FLUID REPLACEMENT:  1200 mL of lactated Ringer's and 1000 mL of albumin.  DRAINS:  There were no drains placed.  INSTRUMENT COUNTS:  Correct.  COMPLICATIONS:  There were no complications.  Perioperative antibiotics were given.  INDICATIONS:  The patient is a 66 year old male who is status post removal of a reverse shoulder replacement for infection with methicillin- sensitive Staph epi as well as Propionibacterium acnes.  The patient underwent removal of his implants with placement of antibiotic spacer and treatment with IV antibiotics followed by oral antibiotics under the guidance of Infectious Disease Service.  The patient now presents for the second stage of a two-stage revision for removal of the antibiotic spacer and placement of the  reverse shoulder replacement implants. Risks and benefits of surgery discussed.  Informed consent obtained.  DESCRIPTION OF PROCEDURE:  After adequate level of anesthesia was achieved, the patient was positioned in modified beach-chair position. Left shoulder was correctly identified and sterilely prepped and draped in usual manner.  Time-out called.  We infiltrated the skin with 0.25% Marcaine with epinephrine followed by incision with the 10 blade scalpel and dissection down through the subcutaneous tissues using the knife. We identified the coracoid process, which gave [place] a landmark, identifying the conjoined tendon.  We were able to free up the deltopectoral muscular tissue off the humerus and the implant and also off the conjoined tendon.  We then resected the anterior pseudocapsule. Did not see any overt signs of infection.  No cloudy fluid.  We went ahead and sent some of that deep soft tissue/pseudocapsule for Gram stain, aerobic, anaerobic cultures.  We then extended the shoulder and delivered the humeral head out of the wound.  We were able to using a bone tamp tap the implant out of the shoulder.  Once that was out, I took the cement with it that we would use for potting.  Once that was out, then we removed any soft tissue from the humeral side, cleaning that and scraping with a combination of rongeur and curettes.  We then subluxed the humerus posteriorly.  We did our  glenoid preparation first. There was quite a bit of time spent just getting tissue freed up away from the anterior, inferior, and posterior aspects of the glenoid. There was some regrown heterotopic bone inferiorly, which I could palpate.  We felt like this was medial enough that it would not interfere with the proper functioning of this laterally offset reverse shoulder replacement.  We did make sure to protect the axillary nerve during this dissection, but we removed scar tissue from the inferior shoulder and  capsular inflammation as well.  Once we had good exposure of the glenoid, we drilled our guide pin for base plate.  We then reamed using the step reamer for the base plate and then impacted the base plate in position.  We placed a central 6.5-mm screw which was 30 mm in length and had excellent purchase inferiorly.  We then went ahead and did it through the superior, inferior, and anterior locked screws with the appropriately length screw, which had good purchase.  The posterior screw had been too short, so we elected not to proceed with that.  We had good baseplate fixation.  We then selected the 41/+3 offset glenosphere that would fit on top of the baseplate.  We first went ahead with the trial.  We then went ahead and went to the humeral side.  We reamed up to a size 12 and then broached to a size 12 comprehensive stem and then impacted the trial stem into position and then selected the thinnest 41 poly and I was able to just get that perch, but did not reduce it.  We removed the trial stem and the humeral-sided implants. We then went back to the glenoid side and selected our real 41/+3 offset glenosphere.  We impacted that on the B setting and dialed inferiorly. Once that was firmly in place, we checked to make sure it was stable which it was, using a Cobb elevator to try to pry it off and then we did a finger sweep to make sure that we had no soft tissue incarcerated in our taper and also to make sure that we had plenty of room for the humeral implant poly.  We then went back to the humeral side.  We placed the stem and this was a real press-fit stem in 20 degrees of retroversion, impacted that in position.  We then selected the real 41 poly, the thinnest one that I had and impacted that in position, reduced the shoulder with nice pop.  It was very stable.  No gapping at all with an inferior pull or external rotation.  Deltoid was tensioned nicely and we felt like the anterior soft  tissues were not excessively tight. Conjoined became tight with the shoulder slightly extended.  We irrigated thoroughly.  We then went ahead and placed the stimulant beads with vancomycin mixed in place.  Those beads would dissolve and vancomycin __________ for about a month, which gave [place] additional antibiotic concentrations at tissue level.  Once we had this placed deep, then we did our a deltopectoral closure with #1 PDS suture interrupted followed by a 2-0 Vicryl subcutaneous closure and then staples.  Sterile dressing was applied.  The patient was transferred to the recovery room in stable condition.     Doran Heater. Veverly Fells, M.D.     SRN/MEDQ  D:  01/17/2017  T:  01/18/2017  Job:  [id number]

## 2017-01-20 ENCOUNTER — Encounter (HOSPITAL_COMMUNITY): Payer: Self-pay | Admitting: Orthopedic Surgery

## 2017-01-22 LAB — AEROBIC/ANAEROBIC CULTURE (SURGICAL/DEEP WOUND)

## 2017-01-22 LAB — AEROBIC/ANAEROBIC CULTURE W GRAM STAIN (SURGICAL/DEEP WOUND): Culture: NO GROWTH

## 2017-01-26 DIAGNOSIS — L814 Other melanin hyperpigmentation: Secondary | ICD-10-CM | POA: Diagnosis not present

## 2017-01-26 DIAGNOSIS — Z85820 Personal history of malignant melanoma of skin: Secondary | ICD-10-CM | POA: Diagnosis not present

## 2017-01-26 DIAGNOSIS — L821 Other seborrheic keratosis: Secondary | ICD-10-CM | POA: Diagnosis not present

## 2017-01-26 DIAGNOSIS — L570 Actinic keratosis: Secondary | ICD-10-CM | POA: Diagnosis not present

## 2017-01-26 DIAGNOSIS — L218 Other seborrheic dermatitis: Secondary | ICD-10-CM | POA: Diagnosis not present

## 2017-01-26 DIAGNOSIS — N400 Enlarged prostate without lower urinary tract symptoms: Secondary | ICD-10-CM | POA: Diagnosis not present

## 2017-01-26 DIAGNOSIS — D1801 Hemangioma of skin and subcutaneous tissue: Secondary | ICD-10-CM | POA: Diagnosis not present

## 2017-01-26 DIAGNOSIS — Z8042 Family history of malignant neoplasm of prostate: Secondary | ICD-10-CM | POA: Diagnosis not present

## 2017-02-03 DIAGNOSIS — N401 Enlarged prostate with lower urinary tract symptoms: Secondary | ICD-10-CM | POA: Diagnosis not present

## 2017-02-03 DIAGNOSIS — Z471 Aftercare following joint replacement surgery: Secondary | ICD-10-CM | POA: Diagnosis not present

## 2017-02-03 DIAGNOSIS — T8450XD Infection and inflammatory reaction due to unspecified internal joint prosthesis, subsequent encounter: Secondary | ICD-10-CM | POA: Diagnosis not present

## 2017-02-03 DIAGNOSIS — K429 Umbilical hernia without obstruction or gangrene: Secondary | ICD-10-CM | POA: Diagnosis not present

## 2017-02-03 DIAGNOSIS — N529 Male erectile dysfunction, unspecified: Secondary | ICD-10-CM | POA: Diagnosis not present

## 2017-02-03 DIAGNOSIS — Z96612 Presence of left artificial shoulder joint: Secondary | ICD-10-CM | POA: Diagnosis not present

## 2017-02-03 DIAGNOSIS — E291 Testicular hypofunction: Secondary | ICD-10-CM | POA: Diagnosis not present

## 2017-02-03 DIAGNOSIS — I10 Essential (primary) hypertension: Secondary | ICD-10-CM | POA: Diagnosis not present

## 2017-02-03 DIAGNOSIS — Z8042 Family history of malignant neoplasm of prostate: Secondary | ICD-10-CM | POA: Diagnosis not present

## 2017-03-03 DIAGNOSIS — Z96612 Presence of left artificial shoulder joint: Secondary | ICD-10-CM | POA: Diagnosis not present

## 2017-03-03 DIAGNOSIS — Z471 Aftercare following joint replacement surgery: Secondary | ICD-10-CM | POA: Diagnosis not present

## 2017-03-08 DIAGNOSIS — M19041 Primary osteoarthritis, right hand: Secondary | ICD-10-CM | POA: Diagnosis not present

## 2017-03-08 DIAGNOSIS — M19042 Primary osteoarthritis, left hand: Secondary | ICD-10-CM | POA: Diagnosis not present

## 2017-03-08 DIAGNOSIS — M79641 Pain in right hand: Secondary | ICD-10-CM | POA: Diagnosis not present

## 2017-03-08 DIAGNOSIS — M79642 Pain in left hand: Secondary | ICD-10-CM | POA: Diagnosis not present

## 2017-03-12 ENCOUNTER — Other Ambulatory Visit: Payer: Self-pay | Admitting: Osteopathic Medicine

## 2017-03-18 ENCOUNTER — Other Ambulatory Visit: Payer: Self-pay | Admitting: Osteopathic Medicine

## 2017-03-24 ENCOUNTER — Encounter: Payer: Self-pay | Admitting: Osteopathic Medicine

## 2017-03-24 ENCOUNTER — Ambulatory Visit (INDEPENDENT_AMBULATORY_CARE_PROVIDER_SITE_OTHER): Payer: Medicare Other | Admitting: Osteopathic Medicine

## 2017-03-24 VITALS — BP 128/76 | HR 85 | Wt 199.0 lb

## 2017-03-24 DIAGNOSIS — F3341 Major depressive disorder, recurrent, in partial remission: Secondary | ICD-10-CM

## 2017-03-24 DIAGNOSIS — F17200 Nicotine dependence, unspecified, uncomplicated: Secondary | ICD-10-CM | POA: Diagnosis not present

## 2017-03-24 DIAGNOSIS — E119 Type 2 diabetes mellitus without complications: Secondary | ICD-10-CM

## 2017-03-24 DIAGNOSIS — I10 Essential (primary) hypertension: Secondary | ICD-10-CM | POA: Diagnosis not present

## 2017-03-24 LAB — POCT GLYCOSYLATED HEMOGLOBIN (HGB A1C): HEMOGLOBIN A1C: 6.2

## 2017-03-24 MED ORDER — SILDENAFIL CITRATE 20 MG PO TABS: 10.0000 mg | ORAL_TABLET | Freq: Every day | ORAL | 3 refills | Status: DC | PRN

## 2017-03-24 NOTE — Patient Instructions (Addendum)
Plan: Continue current medications  See me in three months, sooner if needed

## 2017-03-24 NOTE — Progress Notes (Signed)
HPI: Frederick Guzman is a 67 y.o. male  who presents to [place] today, 03/24/17,  for chief complaint of:  Chief Complaint  Patient presents with  . Diabetes   Tobacco abuse is an issue, patient was prescribed Chantix. Has tried nicotine replacements and has been on Welbutrin in the past. He was somewhat successful with cutting back on cigarettes since starting the Chantix but still has not been able to quit entirely. Up to 1.5 packs per day now of cigarettes off the Chantix.   Depression/anxiety: Was on Cymbalta for several myalgia/pain issues. Has been on other treatments before, has been quite anxious over recent health issues, wife has noticed he is a lot more irritable. We added Wellbutrin at last visit, today doing well on that, wife notes that overall his moods are a lot better, he still has some irritability/outbursts but nothing like it used to be.   DIABETES SCREENING/PREVENTIVE CARE: A1C past 3-6 mos: Yes   09/21/16 8.4% --> stay on just Glimepiride due to intolerance to metformin, reluctant to take Wilder Glade due to cost, financial issues getting medications.   12/15/16: 7.0%   03/24/17: 6.2% - no hypoglycemic symptoms, patient would like to stay on current dose of glimepiride BP goal <130/80: Yes/close    09/21/16: 139/73  10/19/16: 106/61   12/15/16: 135/79  03/24/17: on intake high, took meds 45 mins prior to visit, recheck prior to discharge was much improved LDL goal <70: close, 74 in 11/2016 Eye exam annually: not yet, importance discussed with patient Foot exam: deferred Microalbuminuria: N/a on ACE/ARB Metformin: started 09/2016 but intolerant  ACE/ARB: Yes  Antiplatelet if ASCVD Risk >10% - he is taking low dose ASA  Statin: Taking Pneumovax: done last visit   Immunization History  Administered Date(s) Administered  . Influenza, High Dose Seasonal PF 12/15/2016  . Influenza, Seasonal, Injecte, Preservative Fre  11/27/2015  . Influenza-Unspecified 02/14/2012, 12/27/2012  . Pneumococcal Conjugate-13 03/18/2015  . Pneumococcal Polysaccharide-23 01/19/2008, 12/15/2016  . Tdap 05/31/2011      Past medical, surgical, social and family history reviewed: Patient Active Problem List   Diagnosis Date Noted  . 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 Positive depression screening 09/21/2016  . Uncontrolled type 2 diabetes mellitus with hyperglycemia, without long-term current use of insulin ([place]) 09/21/2016  . Type 2 diabetes mellitus without complications ([place]) 02/40/9735  . Status post shoulder surgery 08/09/2016  . Prosthetic shoulder infection, subsequent encounter 08/09/2016  . Infection and inflammatory reaction due to internal orthopedic device, implant, and graft ([place]) 03/31/2016  . S/P shoulder replacement, left 01/30/2016  . HCAP (healthcare-associated pneumonia) 07/13/2015  . CAP (community acquired pneumonia) 07/13/2015  . Depression   . HTN (hypertension)   . Asthma exacerbation   . MDD (major depressive disorder) ([place]) 04/30/2011  . GERD 11/23/2010  . DERMATITIS, ATOPIC 11/23/2010  . TOBACCO ABUSE 03/10/2007  . OBSTRUCTIVE SLEEP APNEA 03/10/2007  . BRONCHITIS, CHRONIC 03/10/2007  . ASTHMA, MODERATE 03/10/2007   Past Surgical History:  Procedure Laterality Date  . carpel tunnel Bilateral   . COLONOSCOPY W/ POLYPECTOMY    . COLONOSCOPY W/ POLYPECTOMY    . EXCISIONAL TOTAL SHOULDER ARTHROPLASTY WITH ANTIBIOTIC SPACER Left 08/09/2016   Procedure: LEFT SHOULDER HARDWARE REMOVAL, IRRIGATION AND DEBRIDEMENT, PLACEMENT OF ANTIBIOTIC SPACER;  Surgeon: Netta Cedars, MD;  Location: [facility];  Service: Orthopedics;  Laterality: Left;  . IRRIGATION AND DEBRIDEMENT SHOULDER Left 03/31/2016   Procedure: IRRIGATION AND DEBRIDEMENT SHOULDER, deep cultures and polyethylene exchange;  Surgeon: Netta Cedars, MD;  Location: [facility];  Service: Orthopedics;  Laterality: Left;  requests 54mins  . REVERSE SHOULDER  ARTHROPLASTY Left 01/30/2016   Procedure: LEFT REVERSE SHOULDER ARTHROPLASTY;  Surgeon: Netta Cedars, MD;  Location: [facility];  Service: Orthopedics;  Laterality: Left;  . ROTATOR CUFF REPAIR Left   . TOTAL SHOULDER REVISION Left 01/17/2017   Procedure: LEFT SHOULDER REVISION REVERSE TOTAL SHOULDER ARTHROPLASTY;  Surgeon: Netta Cedars, MD;  Location: [facility];  Service: Orthopedics;  Laterality: Left;  . WRIST SURGERY Right 11/2014   Dr. Amedeo Plenty   Social History   Tobacco Use  . Smoking status: Current Every Day Smoker    Packs/day: 1.00    Years: 42.00    Pack years: 42.00  . Smokeless tobacco: Never Used  . Tobacco comment: cutting back  Substance Use Topics  . Alcohol use: No    Alcohol/week: 4.2 oz    Types: 4 Cans of beer, 3 Shots of liquor per week    Comment: beer or mixed drink    Family History  Problem Relation Age of Onset  . Diabetes Mother   . Colon cancer Father   . Prostate cancer Father   . Diabetes Father      Current medication list and allergy/intolerance information reviewed:   Current Outpatient Medications  Medication Sig Dispense Refill  . albuterol (PROAIR HFA) 108 (90 Base) MCG/ACT inhaler Inhale 2 puffs into the lungs every 6 (six) hours as needed for wheezing or shortness of breath.    Marland Kitchen aspirin EC 81 MG tablet Take 81 mg by mouth every other day.     Marland Kitchen atorvastatin (LIPITOR) 20 MG tablet Take 1 tablet (20 mg total) by mouth every other day. 90 tablet 1  . b complex vitamins tablet Take 1 tablet by mouth daily.    . budesonide-formoterol (SYMBICORT) 160-4.5 MCG/ACT inhaler Inhale 2 puffs into the lungs 2 (two) times daily.    Marland Kitchen buPROPion (WELLBUTRIN XL) 150 MG 24 hr tablet Take 1 tablet (150 mg total) by mouth daily. 90 tablet 1  . cetirizine (ZYRTEC) 10 MG tablet Take 10 mg by mouth daily as needed for allergies (seasonal allergies).     Marland Kitchen doxycycline (VIBRA-TABS) 100 MG tablet Take 1 tablet (100 mg total) by mouth 2 (two) times daily. 120 tablet 3  .  DULoxetine (CYMBALTA) 30 MG capsule Take 1 capsule (30 mg total) by mouth daily. 90 capsule 0  . DULoxetine (CYMBALTA) 60 MG capsule Take 1 capsule (60 mg total) by mouth daily. 90 capsule 0  . esomeprazole (NEXIUM) 40 MG capsule Take 40 mg by mouth daily.  0  . finasteride (PROSCAR) 5 MG tablet TAKE 1 TABLET BY MOUTH ONCE DAILY 30 tablet 0  . fluticasone (FLONASE) 50 MCG/ACT nasal spray Place 1 spray into both nostrils daily as needed for allergies.  1  . glimepiride (AMARYL) 2 MG tablet TAKE 1 TABLET BY MOUTH ONCE DAILY 90 tablet 0  . glucose blood test strip Use up to 4 times per day as directed with glucometer. Disp: 100. Refill x99 (Patient taking differently: as directed with glucometer. Disp: 100. Refill x99) 100 each 99  . ibuprofen (ADVIL,MOTRIN) 200 MG tablet Take 600-800 mg by mouth every 6 (six) hours as needed for moderate pain (depends on pain if takes 3-4 tablets).    Marland Kitchen ipratropium-albuterol (DUONEB) 0.5-2.5 (3) MG/3ML SOLN Take 3 mLs by nebulization every 6 (six) hours as needed. (Patient taking differently: Take 3 mLs by nebulization  every 6 (six) hours as needed (for shortness of breath). ) 360 mL 1  . Lancets (FREESTYLE) lancets Use as instructed up to tid 100 each 99  . lisinopril (PRINIVIL,ZESTRIL) 2.5 MG tablet Take 1 tablet (2.5 mg total) by mouth at bedtime. 90 tablet 3  . methocarbamol (ROBAXIN) 500 MG tablet Take 1 tablet (500 mg total) by mouth 3 (three) times daily as needed. 60 tablet 1  . Multiple Vitamin (MULTIVITAMIN WITH MINERALS) TABS tablet Take 1 tablet by mouth at bedtime.    . Omega-3 Fatty Acids (FISH OIL) 1000 MG CAPS Take 1,000 mg by mouth at bedtime.     Marland Kitchen oxyCODONE-acetaminophen (ROXICET) 5-325 MG tablet Take 1-2 tablets by mouth every 4 (four) hours as needed for severe pain. 60 tablet 0  . sildenafil (REVATIO) 20 MG tablet TAKE 4 TABLETS AS NEEDED FOR ED  3  . Tamsulosin HCl (FLOMAX) 0.4 MG CAPS Take 0.4 mg by mouth 2 (two) times daily.     . Testosterone  (AXIRON) 30 MG/ACT SOLN Alternate every other day, one pump under one arm and then one pump under both arms (Patient taking differently: Apply 1-2 Pump topically daily. Alternates taking, 1 pump under one arm one day, 2 pumps the next day (1 pump under each arm).) 90 mL 1  . varenicline (CHANTIX CONTINUING MONTH PAK) 1 MG tablet Take 1 tablet (1 mg total) by mouth 2 (two) times daily. (Patient not taking: Reported on 01/03/2017) 60 tablet 2  . varenicline (CHANTIX STARTING MONTH PAK) 0.5 MG X 11 & 1 MG X 42 tablet Take one 0.5 mg tablet by mouth once daily for 3 days, then increase to one 0.5 mg tablet twice daily for 4 days, then increase to one 1 mg tablet twice daily. (Patient not taking: Reported on 01/03/2017) 42 tablet 0  . vitamin C (ASCORBIC ACID) 500 MG tablet Take 500 mg by mouth daily.     No current facility-administered medications for this visit.    Allergies  Allergen Reactions  . Methotrexate Derivatives Nausea And Vomiting and Other (See Comments)    Mood change  . Ultram [Tramadol Hcl] Nausea And Vomiting      Review of Systems:  Constitutional:  No  fever, no chills, No unintentional weight changes. No significant fatigue.   HEENT: No  headache, no vision change  Cardiac: No  chest pain, No  pressure  Respiratory:  No  shortness of breath.   Gastrointestinal: No  abdominal pain, No  nausea  Musculoskeletal: No new myalgia/arthralgia  Skin: No  Rash  Hem/Onc: No  easy bruising/bleeding  Psychiatric: No  concerns with depression, +concerns with anxiety, No sleep problems, No mood problems  Exam:  BP (!) 156/80   Pulse 82   Wt 199 lb 0.6 oz (90.3 kg)   BMI 26.99 kg/m   Constitutional: VS see above. General Appearance: alert, well-developed, well-nourished, NAD  Ears, Nose, Mouth, Throat: MMM, Normal external inspection ears/nares/mouth/lips/gums.   Neck: No masses, trachea midline.   Respiratory: Normal respiratory effort. no wheeze, no rhonchi, no  rales  Cardiovascular: S1/S2 normal, no murmur, no rub/gallop auscultated. RRR. No lower extremity edema.   Musculoskeletal: Gait normal. No clubbing/cyanosis of digits.   Neurological: Normal balance/coordination.  Skin: warm, dry, intact.   Psychiatric: Normal judgment/insight. Normal mood and affect. Oriented x3.    Results for orders placed or performed in visit on 03/24/17 (from the past 24 hour(s))  POCT HgB A1C     Status: None  Collection Time: 03/24/17  9:22 AM  Result Value Ref Range   Hemoglobin A1C 6.2       ASSESSMENT/PLAN:   Diabetes mellitus without complication (HCC) - Plan: POCT HgB A1C  Essential hypertension  TOBACCO ABUSE  Recurrent major depressive disorder, in partial remission [facility])      Patient Instructions  Plan: Continue current medications  See me in three months, sooner if needed        Visit summary with medication list and pertinent instructions was printed for patient to review. All questions at time of visit were answered - patient instructed to contact office with any additional concerns. ER/RTC precautions were reviewed with the patient. Follow-up plan: Return in about 3 months (around 06/22/2017) for recheck diabetes and blood pressure .  Note: Total time spent 25 minutes, greater than 50% of the visit was spent face-to-face counseling and coordinating care for the following: The primary encounter diagnosis was Diabetes mellitus without complication ([place]). Diagnoses of Essential hypertension, TOBACCO ABUSE, and Recurrent major depressive disorder, in partial remission ([place]) were also pertinent to this visit.Marland Kitchen

## 2017-04-04 ENCOUNTER — Other Ambulatory Visit: Payer: Self-pay | Admitting: Osteopathic Medicine

## 2017-04-04 NOTE — Telephone Encounter (Signed)
Routed to PCP for approval. Last entered by historical provider.

## 2017-04-06 DIAGNOSIS — E119 Type 2 diabetes mellitus without complications: Secondary | ICD-10-CM | POA: Diagnosis not present

## 2017-04-06 DIAGNOSIS — H52223 Regular astigmatism, bilateral: Secondary | ICD-10-CM | POA: Diagnosis not present

## 2017-04-09 LAB — HM DIABETES EYE EXAM

## 2017-04-14 ENCOUNTER — Other Ambulatory Visit: Payer: Self-pay

## 2017-04-14 MED ORDER — FINASTERIDE 5 MG PO TABS: 5.0000 mg | ORAL_TABLET | Freq: Every day | ORAL | 0 refills | Status: DC

## 2017-04-15 ENCOUNTER — Encounter: Payer: Self-pay | Admitting: Osteopathic Medicine

## 2017-04-15 DIAGNOSIS — G4733 Obstructive sleep apnea (adult) (pediatric): Secondary | ICD-10-CM | POA: Diagnosis not present

## 2017-04-18 DIAGNOSIS — F17210 Nicotine dependence, cigarettes, uncomplicated: Secondary | ICD-10-CM | POA: Diagnosis not present

## 2017-04-18 DIAGNOSIS — J449 Chronic obstructive pulmonary disease, unspecified: Secondary | ICD-10-CM | POA: Diagnosis not present

## 2017-04-18 DIAGNOSIS — G4733 Obstructive sleep apnea (adult) (pediatric): Secondary | ICD-10-CM | POA: Diagnosis not present

## 2017-04-18 DIAGNOSIS — I10 Essential (primary) hypertension: Secondary | ICD-10-CM | POA: Diagnosis not present

## 2017-04-19 DIAGNOSIS — M25512 Pain in left shoulder: Secondary | ICD-10-CM

## 2017-04-19 DIAGNOSIS — Z79899 Other long term (current) drug therapy: Secondary | ICD-10-CM

## 2017-04-19 DIAGNOSIS — M25511 Pain in right shoulder: Secondary | ICD-10-CM | POA: Diagnosis not present

## 2017-04-19 DIAGNOSIS — Z4789 Encounter for other orthopedic aftercare: Secondary | ICD-10-CM | POA: Diagnosis not present

## 2017-04-19 HISTORY — DX: Other long term (current) drug therapy: Z79.899

## 2017-04-19 HISTORY — DX: Pain in left shoulder: M25.512

## 2017-04-22 ENCOUNTER — Other Ambulatory Visit: Payer: Self-pay | Admitting: Osteopathic Medicine

## 2017-05-04 ENCOUNTER — Telehealth: Payer: Self-pay | Admitting: Osteopathic Medicine

## 2017-05-04 NOTE — Telephone Encounter (Signed)
Received fax from [facility] about PA for Testosterone. Clinical information was given and Frederick Guzman advised me that we would receive a fax conformation when a determination.

## 2017-06-17 ENCOUNTER — Other Ambulatory Visit: Payer: Self-pay | Admitting: Osteopathic Medicine

## 2017-06-20 ENCOUNTER — Ambulatory Visit: Payer: Medicare Other | Admitting: Osteopathic Medicine

## 2017-06-22 ENCOUNTER — Ambulatory Visit: Payer: Medicare Other | Admitting: Osteopathic Medicine

## 2017-06-27 ENCOUNTER — Encounter: Payer: Self-pay | Admitting: Osteopathic Medicine

## 2017-06-27 ENCOUNTER — Ambulatory Visit (INDEPENDENT_AMBULATORY_CARE_PROVIDER_SITE_OTHER): Payer: Medicare Other | Admitting: Osteopathic Medicine

## 2017-06-27 VITALS — BP 135/80 | HR 82 | Temp 97.6°F | Wt 203.1 lb

## 2017-06-27 DIAGNOSIS — F17200 Nicotine dependence, unspecified, uncomplicated: Secondary | ICD-10-CM

## 2017-06-27 DIAGNOSIS — F418 Other specified anxiety disorders: Secondary | ICD-10-CM | POA: Diagnosis not present

## 2017-06-27 DIAGNOSIS — G8929 Other chronic pain: Secondary | ICD-10-CM | POA: Diagnosis not present

## 2017-06-27 DIAGNOSIS — M9903 Segmental and somatic dysfunction of lumbar region: Secondary | ICD-10-CM | POA: Diagnosis not present

## 2017-06-27 DIAGNOSIS — M9904 Segmental and somatic dysfunction of sacral region: Secondary | ICD-10-CM | POA: Diagnosis not present

## 2017-06-27 DIAGNOSIS — E291 Testicular hypofunction: Secondary | ICD-10-CM | POA: Diagnosis not present

## 2017-06-27 DIAGNOSIS — M9902 Segmental and somatic dysfunction of thoracic region: Secondary | ICD-10-CM | POA: Diagnosis not present

## 2017-06-27 DIAGNOSIS — I10 Essential (primary) hypertension: Secondary | ICD-10-CM | POA: Diagnosis not present

## 2017-06-27 DIAGNOSIS — M25512 Pain in left shoulder: Secondary | ICD-10-CM

## 2017-06-27 DIAGNOSIS — M9901 Segmental and somatic dysfunction of cervical region: Secondary | ICD-10-CM | POA: Diagnosis not present

## 2017-06-27 DIAGNOSIS — E119 Type 2 diabetes mellitus without complications: Secondary | ICD-10-CM | POA: Diagnosis not present

## 2017-06-27 LAB — POCT GLYCOSYLATED HEMOGLOBIN (HGB A1C): Hemoglobin A1C: 6.4

## 2017-06-27 MED ORDER — BUPROPION HCL ER (XL) 300 MG PO TB24: 300.0000 mg | ORAL_TABLET | Freq: Every day | ORAL | 1 refills | Status: DC

## 2017-06-27 NOTE — Progress Notes (Signed)
HPI: Frederick Guzman is a 67 y.o. male  who presents to [place] today, 06/27/17,  for chief complaint of:  DM2 follow-up HTN  anxiety  Depression/anxiety: Was on Cymbalta for several myalgia/pain issues. Has been on other treatments before, has been quite anxious over recent health issues. We added Wellbutrin, doing well on that, wife notes that overall his moods are a lot better, he still has some irritability/outbursts but nothing like it used to be.states he is still having some issues with anxiety. Would like to try CBD oil, if this is okay.   DIABETES SCREENING/PREVENTIVE CARE: A1C past 3-6 mos: Yes   09/21/16 8.4% --> stay on just Glimepiride due to intolerance to metformin, reluctant to take Wilder Glade due to cost, financial issues getting medications.   12/15/16: 7.0%   03/24/17: 6.2% - no hypoglycemic symptoms, patient would like to stay on current dose of glimepiride  06/27/17: 6.4% - no hypoglycemic symptoms. BP goal <130/80: Yes/close    09/21/16: 139/73  10/19/16: 106/61   12/15/16: 135/79  03/24/17: on intake high, took meds 45 mins prior to visit, recheck prior to discharge was much improved  06/27/17: 135/80 LDL goal <70: close, 74 in 11/2016 Eye exam annually: not yet, importance discussed with patient Foot exam: deferred Microalbuminuria: N/a on ACE/ARB Metformin: started 09/2016 but intolerant  ACE/ARB: Yes  Antiplatelet if ASCVD Risk >10% - he is taking low dose ASA  Statin: Taking Pneumovax: done last visit   History of shoulder pain/infections. Notes it has been hurting a bit more lately, feeling swollen. No rash.Has appointment upcoming with orthopedic. Would like to have CBC and inflammatory markers checked just to be safe.   Immunization History  Administered Date(s) Administered  . Influenza, High Dose Seasonal PF 12/15/2016  . Influenza, Seasonal, Injecte, Preservative Fre 11/27/2015  .  Influenza-Unspecified 02/14/2012, 12/27/2012  . Pneumococcal Conjugate-13 03/18/2015  . Pneumococcal Polysaccharide-23 01/19/2008, 12/15/2016  . Tdap 05/31/2011    Results for orders placed or performed in visit on 06/27/17 (from the past 24 hour(s))  POCT HgB A1C     Status: None   Collection Time: 06/27/17  9:25 AM  Result Value Ref Range   Hemoglobin A1C 6.4      Past medical, surgical, social and family history reviewed: Patient Active Problem List   Diagnosis Date Noted  . 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 Positive depression screening 09/21/2016  . Uncontrolled type 2 diabetes mellitus with hyperglycemia, without long-term current use of insulin ([place]) 09/21/2016  . Type 2 diabetes mellitus without complications ([place]) 69/67/8938  . Status post shoulder surgery 08/09/2016  . Prosthetic shoulder infection, subsequent encounter 08/09/2016  . Infection and inflammatory reaction due to internal orthopedic device, implant, and graft ([place]) 03/31/2016  . S/P shoulder replacement, left 01/30/2016  . HCAP (healthcare-associated pneumonia) 07/13/2015  . CAP (community acquired pneumonia) 07/13/2015  . Depression   . HTN (hypertension)   . Asthma exacerbation   . MDD (major depressive disorder) ([place]) 04/30/2011  . GERD 11/23/2010  . DERMATITIS, ATOPIC 11/23/2010  . TOBACCO ABUSE 03/10/2007  . OBSTRUCTIVE SLEEP APNEA 03/10/2007  . BRONCHITIS, CHRONIC 03/10/2007  . ASTHMA, MODERATE 03/10/2007   Past Surgical History:  Procedure Laterality Date  . carpel tunnel Bilateral   . COLONOSCOPY W/ POLYPECTOMY    . COLONOSCOPY W/ POLYPECTOMY    . EXCISIONAL TOTAL SHOULDER ARTHROPLASTY WITH ANTIBIOTIC SPACER Left 08/09/2016   Procedure: LEFT SHOULDER HARDWARE REMOVAL, IRRIGATION AND DEBRIDEMENT, PLACEMENT OF ANTIBIOTIC SPACER;  Surgeon: Netta Cedars, MD;  Location: [place];  Service: Orthopedics;  Laterality: Left;  . IRRIGATION AND DEBRIDEMENT SHOULDER Left 03/31/2016   Procedure: IRRIGATION  AND DEBRIDEMENT SHOULDER, deep cultures and polyethylene exchange;  Surgeon: Netta Cedars, MD;  Location: [facility];  Service: Orthopedics;  Laterality: Left;  requests 44mins  . REVERSE SHOULDER ARTHROPLASTY Left 01/30/2016   Procedure: LEFT REVERSE SHOULDER ARTHROPLASTY;  Surgeon: Netta Cedars, MD;  Location: [facility];  Service: Orthopedics;  Laterality: Left;  . ROTATOR CUFF REPAIR Left   . TOTAL SHOULDER REVISION Left 01/17/2017   Procedure: LEFT SHOULDER REVISION REVERSE TOTAL SHOULDER ARTHROPLASTY;  Surgeon: Netta Cedars, MD;  Location: [facility];  Service: Orthopedics;  Laterality: Left;  . WRIST SURGERY Right 11/2014   Dr. Amedeo Plenty   Social History   Tobacco Use  . Smoking status: Current Every Day Smoker    Packs/day: 1.50    Years: 42.00    Pack years: 63.00  . Smokeless tobacco: Never Used  . Tobacco comment: pt no longer taking chantix  Substance Use Topics  . Alcohol use: No    Alcohol/week: 4.2 oz    Types: 4 Cans of beer, 3 Shots of liquor per week    Comment: beer or mixed drink    Family History  Problem Relation Age of Onset  . Diabetes Mother   . Colon cancer Father   . Prostate cancer Father   . Diabetes Father      Current medication list and allergy/intolerance information reviewed:   Current Outpatient Medications  Medication Sig Dispense Refill  . albuterol (PROAIR HFA) 108 (90 Base) MCG/ACT inhaler Inhale 2 puffs into the lungs every 6 (six) hours as needed for wheezing or shortness of breath.    Marland Kitchen aspirin EC 81 MG tablet Take 81 mg by mouth every other day.     Marland Kitchen atorvastatin (LIPITOR) 20 MG tablet Take 1 tablet (20 mg total) by mouth every other day. 90 tablet 1  . b complex vitamins tablet Take 1 tablet by mouth daily.    . budesonide-formoterol (SYMBICORT) 160-4.5 MCG/ACT inhaler Inhale 2 puffs into the lungs 2 (two) times daily.    Marland Kitchen buPROPion (WELLBUTRIN XL) 150 MG 24 hr tablet Take 1 tablet (150 mg total) by mouth daily. 90 tablet 1  . cetirizine (ZYRTEC)  10 MG tablet Take 10 mg by mouth daily as needed for allergies (seasonal allergies).     . DULoxetine (CYMBALTA) 30 MG capsule Take 1 capsule (30 mg total) by mouth daily. 90 capsule 0  . DULoxetine (CYMBALTA) 60 MG capsule Take 1 capsule (60 mg total) by mouth daily. 90 capsule 0  . esomeprazole (NEXIUM) 40 MG capsule TAKE ONE CAPSULE BY MOUTH ONCE DAILY 90 capsule 1  . finasteride (PROSCAR) 5 MG tablet Take 1 tablet (5 mg total) by mouth daily. 90 tablet 0  . fluticasone (FLONASE) 50 MCG/ACT nasal spray Place 1 spray into both nostrils daily as needed for allergies.  1  . glimepiride (AMARYL) 2 MG tablet TAKE 1 TABLET BY MOUTH ONCE DAILY 90 tablet 0  . glucose blood test strip Use up to 4 times per day as directed with glucometer. Disp: 100. Refill x99 (Patient taking differently: as directed with glucometer. Disp: 100. Refill x99) 100 each 99  . ibuprofen (ADVIL,MOTRIN) 200 MG tablet Take 600-800 mg by mouth every 6 (six) hours as needed for moderate pain (depends on pain if takes 3-4 tablets).    Marland Kitchen ipratropium-albuterol (DUONEB) 0.5-2.5 (3) MG/3ML  SOLN Take 3 mLs by nebulization every 6 (six) hours as needed. (Patient taking differently: Take 3 mLs by nebulization every 6 (six) hours as needed (for shortness of breath). ) 360 mL 1  . Lancets (FREESTYLE) lancets Use as instructed up to tid 100 each 99  . lisinopril (PRINIVIL,ZESTRIL) 2.5 MG tablet Take 1 tablet (2.5 mg total) by mouth at bedtime. 90 tablet 3  . methocarbamol (ROBAXIN) 500 MG tablet Take 1 tablet (500 mg total) by mouth 3 (three) times daily as needed. 60 tablet 1  . Multiple Vitamin (MULTIVITAMIN WITH MINERALS) TABS tablet Take 1 tablet by mouth at bedtime.    . Omega-3 Fatty Acids (FISH OIL) 1000 MG CAPS Take 1,000 mg by mouth at bedtime.     Marland Kitchen oxyCODONE-acetaminophen (ROXICET) 5-325 MG tablet Take 1-2 tablets by mouth every 4 (four) hours as needed for severe pain. 60 tablet 0  . sildenafil (REVATIO) 20 MG tablet Take 0.5-4  tablets (10-80 mg total) by mouth daily as needed. Prior to sex 20 tablet 3  . Tamsulosin HCl (FLOMAX) 0.4 MG CAPS Take 0.4 mg by mouth 2 (two) times daily.     . Testosterone (AXIRON) 30 MG/ACT SOLN Alternate every other day, one pump under one arm and then one pump under both arms (Patient taking differently: Apply 1-2 Pump topically daily. Alternates taking, 1 pump under one arm one day, 2 pumps the next day (1 pump under each arm).) 90 mL 1  . vitamin C (ASCORBIC ACID) 500 MG tablet Take 500 mg by mouth daily.    Marland Kitchen doxycycline (VIBRA-TABS) 100 MG tablet Take 1 tablet (100 mg total) by mouth 2 (two) times daily. (Patient not taking: Reported on 06/27/2017) 120 tablet 3  . varenicline (CHANTIX CONTINUING MONTH PAK) 1 MG tablet Take 1 tablet (1 mg total) by mouth 2 (two) times daily. (Patient not taking: Reported on 06/27/2017) 60 tablet 2  . varenicline (CHANTIX STARTING MONTH PAK) 0.5 MG X 11 & 1 MG X 42 tablet Take one 0.5 mg tablet by mouth once daily for 3 days, then increase to one 0.5 mg tablet twice daily for 4 days, then increase to one 1 mg tablet twice daily. (Patient not taking: Reported on 06/27/2017) 42 tablet 0   No current facility-administered medications for this visit.    Allergies  Allergen Reactions  . Methotrexate Derivatives Nausea And Vomiting and Other (See Comments)    Mood change  . Methotrexate   . Ultram [Tramadol Hcl] Nausea And Vomiting      Review of Systems:  Constitutional:  No  fever, no chills, No unintentional weight changes. No significant fatigue.   HEENT: No  headache, no vision change  Cardiac: No  chest pain, No  pressure  Respiratory:  No  shortness of breath.   Gastrointestinal: No  abdominal pain, No  nausea  Musculoskeletal: No new myalgia/arthralgia  Skin: No  Rash  Hem/Onc: No  easy bruising/bleeding  Psychiatric: No  concerns with depression, +concerns with anxiety, No sleep problems, No mood problems  Exam:  BP 135/80   Pulse 82    Temp 97.6 F (36.4 C) (Oral)   Wt 203 lb 1.9 oz (92.1 kg)   BMI 27.55 kg/m   Constitutional: VS see above. General Appearance: alert, well-developed, well-nourished, NAD  Ears, Nose, Mouth, Throat: MMM, Normal external inspection ears/nares/mouth/lips/gums.   Neck: No masses, trachea midline.   Respiratory: Normal respiratory effort. no wheeze, no rhonchi, no rales  Cardiovascular: S1/S2 normal, no  murmur, no rub/gallop auscultated. RRR. No lower extremity edema.   Musculoskeletal: Gait normal. No clubbing/cyanosis of digits. No joint effusion at shoulder, tenderness at [facility] joint, posterior shoulder.  Neurological: Normal balance/coordination.  Skin: warm, dry, intact.   Psychiatric: Normal judgment/insight. Normal mood and affect. Oriented x3.    Results for orders placed or performed in visit on 06/27/17 (from the past 24 hour(s))  POCT HgB A1C     Status: None   Collection Time: 06/27/17  9:25 AM  Result Value Ref Range   Hemoglobin A1C 6.4       ASSESSMENT/PLAN:   Diabetes mellitus without complication ([place]) - doing well, A1c at goal - Plan: POCT HgB A1C  Chronic left shoulder pain - advised we'll go ahead and get labs, follow-up with orthopedics - Plan: CBC with Differential/Platelet, High sensitivity CRP  Hypogonadism male - he would like testosterone levels checked, urologist retired, would like me to take over management of this medication - Plan: Testosterone  Essential hypertension - doing well  TOBACCO ABUSE - cutting back a little bit, still smoking. Discussed this  Anxious depression - I don't have a problem with CBD oil. Close to max dose of Cymbalta. Will try increasing Wellbutrin          Visit summary with medication list and pertinent instructions was printed for patient to review. All questions at time of visit were answered - patient instructed to contact office with any additional concerns. ER/RTC precautions were reviewed with the patient.  Follow-up plan: Return in about 3 months (around 09/26/2017) for recheck A1C, sooner if needed for anxiety (about 4-6 weeks) .  Note: Total time spent 25 minutes, greater than 50% of the visit was spent face-to-face counseling and coordinating care for the following: The primary encounter diagnosis was Diabetes mellitus without complication ([place]). Diagnoses of Chronic left shoulder pain, Hypogonadism male, Essential hypertension, TOBACCO ABUSE, and Anxious depression were also pertinent to this visit.Marland Kitchen

## 2017-06-28 DIAGNOSIS — M9901 Segmental and somatic dysfunction of cervical region: Secondary | ICD-10-CM | POA: Diagnosis not present

## 2017-06-28 DIAGNOSIS — M9903 Segmental and somatic dysfunction of lumbar region: Secondary | ICD-10-CM | POA: Diagnosis not present

## 2017-06-28 DIAGNOSIS — M9902 Segmental and somatic dysfunction of thoracic region: Secondary | ICD-10-CM | POA: Diagnosis not present

## 2017-06-28 DIAGNOSIS — M9904 Segmental and somatic dysfunction of sacral region: Secondary | ICD-10-CM | POA: Diagnosis not present

## 2017-06-28 LAB — CBC WITH DIFFERENTIAL/PLATELET
BASOS ABS: 58 {cells}/uL (ref 0–200)
Basophils Relative: 0.8 %
EOS PCT: 6.8 %
Eosinophils Absolute: 490 cells/uL (ref 15–500)
HEMATOCRIT: 44.3 % (ref 38.5–50.0)
HEMOGLOBIN: 15.1 g/dL (ref 13.2–17.1)
Lymphs Abs: 2174 cells/uL (ref 850–3900)
MCH: 30.6 pg (ref 27.0–33.0)
MCHC: 34.1 g/dL (ref 32.0–36.0)
MCV: 89.7 fL (ref 80.0–100.0)
MPV: 9.1 fL (ref 7.5–12.5)
Monocytes Relative: 6.9 %
NEUTROS ABS: 3982 {cells}/uL (ref 1500–7800)
NEUTROS PCT: 55.3 %
Platelets: 249 10*3/uL (ref 140–400)
RBC: 4.94 10*6/uL (ref 4.20–5.80)
RDW: 13.4 % (ref 11.0–15.0)
Total Lymphocyte: 30.2 %
WBC: 7.2 10*3/uL (ref 3.8–10.8)
WBCMIX: 497 {cells}/uL (ref 200–950)

## 2017-06-28 LAB — HIGH SENSITIVITY CRP: hs-CRP: >10 mg/L — ABNORMAL HIGH

## 2017-06-28 LAB — TESTOSTERONE: TESTOSTERONE: 498 ng/dL (ref 250–827)

## 2017-06-29 DIAGNOSIS — M9902 Segmental and somatic dysfunction of thoracic region: Secondary | ICD-10-CM | POA: Diagnosis not present

## 2017-06-29 DIAGNOSIS — M9904 Segmental and somatic dysfunction of sacral region: Secondary | ICD-10-CM | POA: Diagnosis not present

## 2017-06-29 DIAGNOSIS — M9901 Segmental and somatic dysfunction of cervical region: Secondary | ICD-10-CM | POA: Diagnosis not present

## 2017-06-29 DIAGNOSIS — M9903 Segmental and somatic dysfunction of lumbar region: Secondary | ICD-10-CM | POA: Diagnosis not present

## 2017-06-30 DIAGNOSIS — M9904 Segmental and somatic dysfunction of sacral region: Secondary | ICD-10-CM | POA: Diagnosis not present

## 2017-06-30 DIAGNOSIS — M9902 Segmental and somatic dysfunction of thoracic region: Secondary | ICD-10-CM | POA: Diagnosis not present

## 2017-06-30 DIAGNOSIS — M9903 Segmental and somatic dysfunction of lumbar region: Secondary | ICD-10-CM | POA: Diagnosis not present

## 2017-06-30 DIAGNOSIS — M9901 Segmental and somatic dysfunction of cervical region: Secondary | ICD-10-CM | POA: Diagnosis not present

## 2017-07-04 DIAGNOSIS — M9902 Segmental and somatic dysfunction of thoracic region: Secondary | ICD-10-CM | POA: Diagnosis not present

## 2017-07-04 DIAGNOSIS — M9904 Segmental and somatic dysfunction of sacral region: Secondary | ICD-10-CM | POA: Diagnosis not present

## 2017-07-04 DIAGNOSIS — M9903 Segmental and somatic dysfunction of lumbar region: Secondary | ICD-10-CM | POA: Diagnosis not present

## 2017-07-04 DIAGNOSIS — M9901 Segmental and somatic dysfunction of cervical region: Secondary | ICD-10-CM | POA: Diagnosis not present

## 2017-07-05 DIAGNOSIS — M9904 Segmental and somatic dysfunction of sacral region: Secondary | ICD-10-CM | POA: Diagnosis not present

## 2017-07-05 DIAGNOSIS — M9902 Segmental and somatic dysfunction of thoracic region: Secondary | ICD-10-CM | POA: Diagnosis not present

## 2017-07-05 DIAGNOSIS — M9903 Segmental and somatic dysfunction of lumbar region: Secondary | ICD-10-CM | POA: Diagnosis not present

## 2017-07-05 DIAGNOSIS — M9901 Segmental and somatic dysfunction of cervical region: Secondary | ICD-10-CM | POA: Diagnosis not present

## 2017-07-06 DIAGNOSIS — M9901 Segmental and somatic dysfunction of cervical region: Secondary | ICD-10-CM | POA: Diagnosis not present

## 2017-07-06 DIAGNOSIS — M9904 Segmental and somatic dysfunction of sacral region: Secondary | ICD-10-CM | POA: Diagnosis not present

## 2017-07-06 DIAGNOSIS — M9903 Segmental and somatic dysfunction of lumbar region: Secondary | ICD-10-CM | POA: Diagnosis not present

## 2017-07-06 DIAGNOSIS — M9902 Segmental and somatic dysfunction of thoracic region: Secondary | ICD-10-CM | POA: Diagnosis not present

## 2017-07-07 DIAGNOSIS — M9901 Segmental and somatic dysfunction of cervical region: Secondary | ICD-10-CM | POA: Diagnosis not present

## 2017-07-07 DIAGNOSIS — M9902 Segmental and somatic dysfunction of thoracic region: Secondary | ICD-10-CM | POA: Diagnosis not present

## 2017-07-07 DIAGNOSIS — M9903 Segmental and somatic dysfunction of lumbar region: Secondary | ICD-10-CM | POA: Diagnosis not present

## 2017-07-07 DIAGNOSIS — M9904 Segmental and somatic dysfunction of sacral region: Secondary | ICD-10-CM | POA: Diagnosis not present

## 2017-07-12 DIAGNOSIS — M9903 Segmental and somatic dysfunction of lumbar region: Secondary | ICD-10-CM | POA: Diagnosis not present

## 2017-07-12 DIAGNOSIS — M9904 Segmental and somatic dysfunction of sacral region: Secondary | ICD-10-CM | POA: Diagnosis not present

## 2017-07-12 DIAGNOSIS — M9902 Segmental and somatic dysfunction of thoracic region: Secondary | ICD-10-CM | POA: Diagnosis not present

## 2017-07-12 DIAGNOSIS — M9901 Segmental and somatic dysfunction of cervical region: Secondary | ICD-10-CM | POA: Diagnosis not present

## 2017-07-13 DIAGNOSIS — M9903 Segmental and somatic dysfunction of lumbar region: Secondary | ICD-10-CM | POA: Diagnosis not present

## 2017-07-13 DIAGNOSIS — M9904 Segmental and somatic dysfunction of sacral region: Secondary | ICD-10-CM | POA: Diagnosis not present

## 2017-07-13 DIAGNOSIS — M9902 Segmental and somatic dysfunction of thoracic region: Secondary | ICD-10-CM | POA: Diagnosis not present

## 2017-07-13 DIAGNOSIS — M9901 Segmental and somatic dysfunction of cervical region: Secondary | ICD-10-CM | POA: Diagnosis not present

## 2017-07-14 DIAGNOSIS — M25512 Pain in left shoulder: Secondary | ICD-10-CM | POA: Diagnosis not present

## 2017-07-20 ENCOUNTER — Other Ambulatory Visit: Payer: Self-pay | Admitting: Osteopathic Medicine

## 2017-07-21 ENCOUNTER — Other Ambulatory Visit: Payer: Self-pay | Admitting: Osteopathic Medicine

## 2017-07-21 NOTE — Telephone Encounter (Signed)
Left a brief vm msg for pt regarding med RF sent to [facility] [employer]. Call back information provided.

## 2017-07-27 DIAGNOSIS — D1801 Hemangioma of skin and subcutaneous tissue: Secondary | ICD-10-CM | POA: Diagnosis not present

## 2017-07-27 DIAGNOSIS — L570 Actinic keratosis: Secondary | ICD-10-CM | POA: Diagnosis not present

## 2017-07-27 DIAGNOSIS — L821 Other seborrheic keratosis: Secondary | ICD-10-CM | POA: Diagnosis not present

## 2017-07-27 DIAGNOSIS — D485 Neoplasm of uncertain behavior of skin: Secondary | ICD-10-CM | POA: Diagnosis not present

## 2017-07-27 DIAGNOSIS — L720 Epidermal cyst: Secondary | ICD-10-CM | POA: Diagnosis not present

## 2017-07-27 DIAGNOSIS — C44629 Squamous cell carcinoma of skin of left upper limb, including shoulder: Secondary | ICD-10-CM | POA: Diagnosis not present

## 2017-07-27 DIAGNOSIS — D225 Melanocytic nevi of trunk: Secondary | ICD-10-CM | POA: Diagnosis not present

## 2017-07-27 DIAGNOSIS — Z85820 Personal history of malignant melanoma of skin: Secondary | ICD-10-CM | POA: Diagnosis not present

## 2017-07-27 DIAGNOSIS — Z85828 Personal history of other malignant neoplasm of skin: Secondary | ICD-10-CM | POA: Diagnosis not present

## 2017-08-04 DIAGNOSIS — M79641 Pain in right hand: Secondary | ICD-10-CM | POA: Insufficient documentation

## 2017-08-04 HISTORY — DX: Pain in right hand: M79.641

## 2017-08-05 DIAGNOSIS — M13841 Other specified arthritis, right hand: Secondary | ICD-10-CM | POA: Diagnosis not present

## 2017-08-05 DIAGNOSIS — M79641 Pain in right hand: Secondary | ICD-10-CM | POA: Diagnosis not present

## 2017-08-05 DIAGNOSIS — M13842 Other specified arthritis, left hand: Secondary | ICD-10-CM | POA: Diagnosis not present

## 2017-09-06 ENCOUNTER — Other Ambulatory Visit: Payer: Self-pay

## 2017-09-06 ENCOUNTER — Telehealth: Payer: Self-pay

## 2017-09-06 MED ORDER — ATORVASTATIN CALCIUM 20 MG PO TABS: 20.0000 mg | ORAL_TABLET | ORAL | 1 refills | Status: DC

## 2017-09-06 MED ORDER — ESOMEPRAZOLE MAGNESIUM 40 MG PO CPDR: 40.0000 mg | DELAYED_RELEASE_CAPSULE | Freq: Every day | ORAL | 1 refills | Status: DC

## 2017-09-06 MED ORDER — DULOXETINE HCL 60 MG PO CPEP
60.0000 mg | ORAL_CAPSULE | Freq: Every day | ORAL | 0 refills | Status: DC
Start: 2017-09-06 — End: 2017-10-17

## 2017-09-06 MED ORDER — LISINOPRIL 2.5 MG PO TABS: 2.5000 mg | ORAL_TABLET | Freq: Every day | ORAL | 3 refills | Status: DC

## 2017-09-06 MED ORDER — DULOXETINE HCL 30 MG PO CPEP
30.0000 mg | ORAL_CAPSULE | Freq: Every day | ORAL | 0 refills | Status: DC
Start: 2017-09-06 — End: 2018-01-30

## 2017-09-06 MED ORDER — GLIMEPIRIDE 2 MG PO TABS: 2.0000 mg | ORAL_TABLET | Freq: Every day | ORAL | 1 refills | Status: DC

## 2017-09-06 MED ORDER — BUPROPION HCL ER (XL) 300 MG PO TB24: 300.0000 mg | ORAL_TABLET | Freq: Every day | ORAL | 1 refills | Status: DC

## 2017-09-06 MED ORDER — TAMSULOSIN HCL 0.4 MG PO CAPS: 0.4000 mg | ORAL_CAPSULE | Freq: Two times a day (BID) | ORAL | 3 refills | Status: DC

## 2017-09-06 NOTE — Telephone Encounter (Signed)
[facility] requesting med RF for testosterone. Pt was a previous Dentist. Thanks.

## 2017-09-07 MED ORDER — TESTOSTERONE 30 MG/ACT TD SOLN: 1.0000 | Freq: Every day | TRANSDERMAL | 3 refills | Status: DC

## 2017-09-07 NOTE — Telephone Encounter (Signed)
Left vm msg for pt re: med RF. Call back information provided.

## 2017-09-07 NOTE — Telephone Encounter (Signed)
Sent!

## 2017-09-26 ENCOUNTER — Ambulatory Visit: Payer: Medicare Other | Admitting: Osteopathic Medicine

## 2017-10-17 ENCOUNTER — Ambulatory Visit (INDEPENDENT_AMBULATORY_CARE_PROVIDER_SITE_OTHER): Payer: Medicare Other | Admitting: Osteopathic Medicine

## 2017-10-17 ENCOUNTER — Encounter: Payer: Self-pay | Admitting: Osteopathic Medicine

## 2017-10-17 VITALS — BP 137/78 | HR 74 | Temp 97.8°F | Wt 199.5 lb

## 2017-10-17 DIAGNOSIS — E291 Testicular hypofunction: Secondary | ICD-10-CM

## 2017-10-17 DIAGNOSIS — F17200 Nicotine dependence, unspecified, uncomplicated: Secondary | ICD-10-CM | POA: Diagnosis not present

## 2017-10-17 DIAGNOSIS — F418 Other specified anxiety disorders: Secondary | ICD-10-CM

## 2017-10-17 DIAGNOSIS — R0602 Shortness of breath: Secondary | ICD-10-CM | POA: Diagnosis not present

## 2017-10-17 DIAGNOSIS — N529 Male erectile dysfunction, unspecified: Secondary | ICD-10-CM

## 2017-10-17 DIAGNOSIS — I10 Essential (primary) hypertension: Secondary | ICD-10-CM

## 2017-10-17 DIAGNOSIS — E119 Type 2 diabetes mellitus without complications: Secondary | ICD-10-CM | POA: Diagnosis not present

## 2017-10-17 DIAGNOSIS — G4733 Obstructive sleep apnea (adult) (pediatric): Secondary | ICD-10-CM | POA: Diagnosis not present

## 2017-10-17 LAB — POCT GLYCOSYLATED HEMOGLOBIN (HGB A1C): Hemoglobin A1C: 6.4 % — AB (ref 4.0–5.6)

## 2017-10-17 MED ORDER — TADALAFIL 5 MG PO TABS: 5.0000 mg | ORAL_TABLET | Freq: Every day | ORAL | 1 refills | Status: DC

## 2017-10-17 NOTE — Progress Notes (Signed)
HPI: Frederick Guzman is a 67 y.o. male who  has a past medical history of Arthritis, Asthma, Cancer ([place]) (08/2014), COPD (chronic obstructive pulmonary disease) ([place]), Depression, Diabetes mellitus without complication ([place]), Diverticulitis, Dyspnea, Fibromyalgia, GERD (gastroesophageal reflux disease), Hyperlipemia, Hypertension, Pneumonia (06/2015), and Sleep apnea.  he presents to [facility] today, 10/17/17,  for chief complaint of:  DM2 f/u HTN f/u Anxiety f/u  Depression/anxiety: At last visit, we discussed Cymbalta, Wellbutrin.  Patient at that time was interested in trying CBD oil.  He is tapering off of the Cymbalta and doing okay with this.  He still reports mood problems, anger issues particularly when driving, occasionally triggered by something and wife feels that he is lashing out irrationally.  She notes initial improvement on Wellbutrin but this seems to be something "he has gotten tolerant to".  He has some questions about erectile dysfunction issues.  Previously on 5 mg Cialis daily from his urologist, this was working okay.  He is also on testosterone replacement.  He has some questions about possibly being able to come off of his antihypertensive medicines.  Reports some increasing shortness of breath over the past month or so.  He is worried about possible pneumonia.  He has a follow-up tomorrow with pulmonology.  See physical exam notes below, lung auscultation demonstrated diffuse wheezing/coarse breath sounds, no rales.   DIABETES SCREENING/PREVENTIVE CARE: A1C past 3-6 mos: Yes   09/21/16 8.4% --> stay on just Glimepiride due to intolerance to metformin, reluctant to take Wilder Glade due to cost, financial issues getting medications.   12/15/16: 7.0%   03/24/17: 6.2% - no hypoglycemic symptoms, patient would like to stay on current dose of glimepiride  06/27/17: 6.4% - no hypoglycemic symptoms.  10/17/17 today: 6.4% BP goal  <130/80: Yes/close    09/21/16: 139/73  10/19/16: 106/61   12/15/16: 135/79  03/24/17: on intake high, took meds 45 mins prior to visit, recheck prior to discharge was much improved  06/27/17: 135/80  10/17/17 today: elevated, pt reports anxiety  LDL goal <70: close, 74 in 11/2016 Eye exam annually: not yet, importance discussed with patient Foot exam: Ran out of time today Microalbuminuria: N/a on ACE/ARB Metformin: started 09/2016 but intolerant  ACE/ARB: Yes  Antiplatelet if ASCVD Risk >10% - he is taking low dose ASA  Statin: Taking Pneumovax: done      Patient is accompanied by wife who assists with history-taking.   Past medical history, surgical history, and family history reviewed.  Current medication list and allergy/intolerance information reviewed.   (See remainder of HPI, ROS, Phys Exam below)  BP 137/78 (BP Location: Left Arm, Patient Position: Sitting, Cuff Size: Normal)   Pulse 74   Temp 97.8 F (36.6 C) (Oral)   Wt 199 lb 8 oz (90.5 kg)   SpO2 94%   BMI 27.06 kg/m     ASSESSMENT/PLAN:   Diabetes mellitus without complication (HCC) - Plan: POCT HgB A1C  Hypogonadism male - Continue testosterone replacement  Erectile dysfunction, unspecified erectile dysfunction type - Added Cialis 5 mg daily, can consider alternative based on effectiveness/cost/tolerance.  Advised needs to continue antihypertensives  Essential hypertension - Blood pressure initially high on intake, recheck shows improvement.  Anxious depression - See patient instructions, will see how he is doing off of Cymbalta, he like to avoid medications but SSRI plus Wellbutrin may help  Shortness of breath - Offered chest x-ray, prednisone burst.  Sounds like likely COPD exacerbation but patient declines since he  is seeing [facility] tomorrow.  Precautions reviewed.  Tobacco dependence   Meds ordered this encounter  Medications  . tadalafil (CIALIS) 5 MG tablet    Sig: Take 1 tablet (5 mg  total) by mouth daily. Use daily.    Dispense:  90 tablet    Refill:  1    Patient Instructions  We will see what the lung doctor says tomorrow.  If your breathing gets worse, please seek medical attention.  You can call the office after hours and I can always send a burst of prednisone if needed.  Let's plan to follow-up in the office in the next month or so to see how you are doing off of the Cymbalta and to discuss other management options/strategies for anxiety.   Follow-up plan: Return in about 1 month (around 11/14/2017) for Recheck anxiety and BP.     ############################################ ############################################ ############################################ ############################################    Outpatient Encounter Medications as of 10/17/2017  Medication Sig Note  . albuterol (PROAIR HFA) 108 (90 Base) MCG/ACT inhaler Inhale 2 puffs into the lungs every 6 (six) hours as needed for wheezing or shortness of breath.   Marland Kitchen aspirin EC 81 MG tablet Take 81 mg by mouth every other day.    Marland Kitchen atorvastatin (LIPITOR) 20 MG tablet Take 1 tablet (20 mg total) by mouth every other day.   . b complex vitamins tablet Take 1 tablet by mouth daily.   . budesonide-formoterol (SYMBICORT) 160-4.5 MCG/ACT inhaler Inhale 2 puffs into the lungs 2 (two) times daily.   Marland Kitchen buPROPion (WELLBUTRIN XL) 300 MG 24 hr tablet Take 1 tablet (300 mg total) by mouth daily.   . cetirizine (ZYRTEC) 10 MG tablet Take 10 mg by mouth daily as needed for allergies (seasonal allergies).    . DULoxetine (CYMBALTA) 30 MG capsule Take 1 capsule (30 mg total) by mouth daily.   Marland Kitchen esomeprazole (NEXIUM) 40 MG capsule Take 1 capsule (40 mg total) by mouth daily.   . fluticasone (FLONASE) 50 MCG/ACT nasal spray Place 1 spray into both nostrils daily as needed for allergies.   Marland Kitchen glimepiride (AMARYL) 2 MG tablet Take 1 tablet (2 mg total) by mouth daily.   Marland Kitchen glucose blood test strip Use up to 4  times per day as directed with glucometer. Disp: 100. Refill x99 (Patient taking differently: as directed with glucometer. Disp: 100. Refill x99) 01/07/2017: Patient only gets stuck 2 times weekly per Dr  . ibuprofen (ADVIL,MOTRIN) 200 MG tablet Take 600-800 mg by mouth every 6 (six) hours as needed for moderate pain (depends on pain if takes 3-4 tablets).   Marland Kitchen ipratropium-albuterol (DUONEB) 0.5-2.5 (3) MG/3ML SOLN Take 3 mLs by nebulization every 6 (six) hours as needed. (Patient taking differently: Take 3 mLs by nebulization every 6 (six) hours as needed (for shortness of breath). )   . Lancets (FREESTYLE) lancets Use as instructed up to tid   . lisinopril (PRINIVIL,ZESTRIL) 2.5 MG tablet Take 1 tablet (2.5 mg total) by mouth at bedtime.   . Multiple Vitamin (MULTIVITAMIN WITH MINERALS) TABS tablet Take 1 tablet by mouth at bedtime.   . Omega-3 Fatty Acids (FISH OIL) 1000 MG CAPS Take 1,000 mg by mouth at bedtime.    . sildenafil (REVATIO) 20 MG tablet Take 0.5-4 tablets (10-80 mg total) by mouth daily as needed. Prior to sex   . tamsulosin (FLOMAX) 0.4 MG CAPS capsule Take 1 capsule (0.4 mg total) by mouth 2 (two) times daily.   . Testosterone 30 MG/ACT  SOLN Apply 1-2 Pump topically daily. Alternates taking, 1 pump under one arm one day, 2 pumps the next day (1 pump under each arm).   . vitamin C (ASCORBIC ACID) 500 MG tablet Take 500 mg by mouth daily.   . DULoxetine (CYMBALTA) 60 MG capsule Take 1 capsule (60 mg total) by mouth daily. (Patient not taking: Reported on 10/17/2017)   . finasteride (PROSCAR) 5 MG tablet Take 1 tablet (5 mg total) by mouth daily. (Patient not taking: Reported on 10/17/2017)   . methocarbamol (ROBAXIN) 500 MG tablet Take 1 tablet (500 mg total) by mouth 3 (three) times daily as needed. (Patient not taking: Reported on 10/17/2017) 06/27/2017: PRN  . oxyCODONE-acetaminophen (ROXICET) 5-325 MG tablet Take 1-2 tablets by mouth every 4 (four) hours as needed for severe pain.  (Patient not taking: Reported on 10/17/2017) 06/27/2017: PRN  . varenicline (CHANTIX CONTINUING MONTH PAK) 1 MG tablet Take 1 tablet (1 mg total) by mouth 2 (two) times daily. (Patient not taking: Reported on 06/27/2017)   . varenicline (CHANTIX STARTING MONTH PAK) 0.5 MG X 11 & 1 MG X 42 tablet Take one 0.5 mg tablet by mouth once daily for 3 days, then increase to one 0.5 mg tablet twice daily for 4 days, then increase to one 1 mg tablet twice daily. (Patient not taking: Reported on 06/27/2017)    No facility-administered encounter medications on file as of 10/17/2017.    Allergies  Allergen Reactions  . Methotrexate Derivatives Nausea And Vomiting and Other (See Comments)    Mood change  . Methotrexate   . Ultram [Tramadol Hcl] Nausea And Vomiting      Review of Systems:  Constitutional: No recent illness  HEENT: No  headache, no vision change  Cardiac: No  chest pain, No  pressure, No palpitations  Respiratory:  +shortness of breath. +Cough  Gastrointestinal: No  abdominal pain, no change on bowel habits  Musculoskeletal: No new myalgia/arthralgia  Skin: No  Rash  Hem/Onc: +easy bruising, no other bleeding, No  abnormal lumps/bumps  Neurologic: No  weakness, No  Dizziness  Psychiatric: No  concerns with depression, +concerns with anxiety  Exam:  BP 137/78 (BP Location: Left Arm, Patient Position: Sitting, Cuff Size: Normal)   Pulse 74   Temp 97.8 F (36.6 C) (Oral)   Wt 199 lb 8 oz (90.5 kg)   SpO2 94%   BMI 27.06 kg/m   Constitutional: VS see above. General Appearance: alert, well-developed, well-nourished, NAD  Eyes: Normal lids and conjunctive, non-icteric sclera  Ears, Nose, Mouth, Throat: MMM, Normal external inspection ears/nares/mouth/lips/gums.  Neck: No masses, trachea midline.   Respiratory: Normal respiratory effort. +diffuse wheeze, no rhonchi, no rales  Cardiovascular: S1/S2 normal, no murmur, no rub/gallop auscultated. RRR.   Musculoskeletal: Gait  normal. Symmetric and independent movement of all extremities  Neurological: Normal balance/coordination. No tremor.  Skin: warm, dry, intact.   Psychiatric: Normal judgment/insight. Normal mood and affect. Oriented x3.   Visit summary with medication list and pertinent instructions was printed for patient to review, advised to alert [place] if any changes needed. All questions at time of visit were answered - patient instructed to contact office with any additional concerns. ER/RTC precautions were reviewed with the patient and understanding verbalized.   Follow-up plan: Return in about 1 month (around 11/14/2017) for Recheck anxiety and BP.  Note: Total time spent 40 minutes, greater than 50% of the visit was spent face-to-face counseling and coordinating care for the following: The primary encounter  diagnosis was Diabetes mellitus without complication ([place]). Diagnoses of Hypogonadism male, Erectile dysfunction, unspecified erectile dysfunction type, Essential hypertension, Anxious depression, Shortness of breath, and Tobacco dependence were also pertinent to this visit.Marland Kitchen  Please note: voice recognition software was used to produce this document, and typos may escape review. Please contact Dr. Sheppard Coil for any needed clarifications.

## 2017-10-17 NOTE — Patient Instructions (Signed)
We will see what the lung doctor says tomorrow.  If your breathing gets worse, please seek medical attention.  You can call the office after hours and I can always send a burst of prednisone if needed.  Let's plan to follow-up in the office in the next month or so to see how you are doing off of the Cymbalta and to discuss other management options/strategies for anxiety.

## 2017-10-18 DIAGNOSIS — F17210 Nicotine dependence, cigarettes, uncomplicated: Secondary | ICD-10-CM | POA: Diagnosis not present

## 2017-10-18 DIAGNOSIS — J449 Chronic obstructive pulmonary disease, unspecified: Secondary | ICD-10-CM | POA: Diagnosis not present

## 2017-10-18 DIAGNOSIS — J441 Chronic obstructive pulmonary disease with (acute) exacerbation: Secondary | ICD-10-CM | POA: Diagnosis not present

## 2017-10-18 DIAGNOSIS — Z87891 Personal history of nicotine dependence: Secondary | ICD-10-CM | POA: Diagnosis not present

## 2017-10-22 ENCOUNTER — Emergency Department (HOSPITAL_BASED_OUTPATIENT_CLINIC_OR_DEPARTMENT_OTHER): Payer: Medicare Other

## 2017-10-22 ENCOUNTER — Inpatient Hospital Stay (HOSPITAL_BASED_OUTPATIENT_CLINIC_OR_DEPARTMENT_OTHER)
Admission: EM | Admit: 2017-10-22 | Discharge: 2017-10-24 | DRG: 871 | Disposition: A | Discharge status: Home / Self Care | Payer: Medicare Other | Attending: Internal Medicine | Admitting: Internal Medicine

## 2017-10-22 ENCOUNTER — Other Ambulatory Visit: Payer: Self-pay

## 2017-10-22 ENCOUNTER — Encounter (HOSPITAL_BASED_OUTPATIENT_CLINIC_OR_DEPARTMENT_OTHER): Payer: Self-pay | Admitting: *Deleted

## 2017-10-22 DIAGNOSIS — R918 Other nonspecific abnormal finding of lung field: Secondary | ICD-10-CM

## 2017-10-22 DIAGNOSIS — A419 Sepsis, unspecified organism: Secondary | ICD-10-CM | POA: Diagnosis not present

## 2017-10-22 DIAGNOSIS — J159 Unspecified bacterial pneumonia: Secondary | ICD-10-CM | POA: Diagnosis present

## 2017-10-22 DIAGNOSIS — Z79890 Hormone replacement therapy (postmenopausal): Secondary | ICD-10-CM

## 2017-10-22 DIAGNOSIS — E785 Hyperlipidemia, unspecified: Secondary | ICD-10-CM | POA: Diagnosis present

## 2017-10-22 DIAGNOSIS — E119 Type 2 diabetes mellitus without complications: Secondary | ICD-10-CM | POA: Diagnosis present

## 2017-10-22 DIAGNOSIS — Z79899 Other long term (current) drug therapy: Secondary | ICD-10-CM

## 2017-10-22 DIAGNOSIS — F17200 Nicotine dependence, unspecified, uncomplicated: Secondary | ICD-10-CM | POA: Diagnosis present

## 2017-10-22 DIAGNOSIS — Z888 Allergy status to other drugs, medicaments and biological substances status: Secondary | ICD-10-CM

## 2017-10-22 DIAGNOSIS — J440 Chronic obstructive pulmonary disease with acute lower respiratory infection: Secondary | ICD-10-CM | POA: Diagnosis present

## 2017-10-22 DIAGNOSIS — Z7951 Long term (current) use of inhaled steroids: Secondary | ICD-10-CM

## 2017-10-22 DIAGNOSIS — I10 Essential (primary) hypertension: Secondary | ICD-10-CM | POA: Diagnosis present

## 2017-10-22 DIAGNOSIS — R911 Solitary pulmonary nodule: Secondary | ICD-10-CM | POA: Diagnosis present

## 2017-10-22 DIAGNOSIS — Z833 Family history of diabetes mellitus: Secondary | ICD-10-CM

## 2017-10-22 DIAGNOSIS — J441 Chronic obstructive pulmonary disease with (acute) exacerbation: Secondary | ICD-10-CM | POA: Diagnosis not present

## 2017-10-22 DIAGNOSIS — M797 Fibromyalgia: Secondary | ICD-10-CM | POA: Diagnosis not present

## 2017-10-22 DIAGNOSIS — Z85820 Personal history of malignant melanoma of skin: Secondary | ICD-10-CM

## 2017-10-22 DIAGNOSIS — Z716 Tobacco abuse counseling: Secondary | ICD-10-CM

## 2017-10-22 DIAGNOSIS — K219 Gastro-esophageal reflux disease without esophagitis: Secondary | ICD-10-CM | POA: Diagnosis present

## 2017-10-22 DIAGNOSIS — F17210 Nicotine dependence, cigarettes, uncomplicated: Secondary | ICD-10-CM | POA: Diagnosis present

## 2017-10-22 DIAGNOSIS — G4733 Obstructive sleep apnea (adult) (pediatric): Secondary | ICD-10-CM | POA: Diagnosis present

## 2017-10-22 DIAGNOSIS — Z96612 Presence of left artificial shoulder joint: Secondary | ICD-10-CM | POA: Diagnosis present

## 2017-10-22 DIAGNOSIS — Z7982 Long term (current) use of aspirin: Secondary | ICD-10-CM

## 2017-10-22 DIAGNOSIS — R000 Tachycardia, unspecified: Secondary | ICD-10-CM | POA: Diagnosis not present

## 2017-10-22 DIAGNOSIS — R0602 Shortness of breath: Secondary | ICD-10-CM | POA: Diagnosis not present

## 2017-10-22 DIAGNOSIS — Z885 Allergy status to narcotic agent status: Secondary | ICD-10-CM

## 2017-10-22 LAB — BASIC METABOLIC PANEL
ANION GAP: 10 (ref 5–15)
BUN: 20 mg/dL (ref 8–23)
CO2: 26 mmol/L (ref 22–32)
Calcium: 8.3 mg/dL — ABNORMAL LOW (ref 8.9–10.3)
Chloride: 102 mmol/L (ref 98–111)
Creatinine, Ser: 1.08 mg/dL (ref 0.61–1.24)
Glucose, Bld: 131 mg/dL — ABNORMAL HIGH (ref 70–99)
POTASSIUM: 3.5 mmol/L (ref 3.5–5.1)
SODIUM: 138 mmol/L (ref 135–145)

## 2017-10-22 LAB — CBC WITH DIFFERENTIAL/PLATELET
BASOS ABS: 0 10*3/uL (ref 0.0–0.1)
Basophils Relative: 0 %
EOS ABS: 0 10*3/uL (ref 0.0–0.7)
EOS PCT: 0 %
HCT: 43 % (ref 39.0–52.0)
HEMOGLOBIN: 15.1 g/dL (ref 13.0–17.0)
LYMPHS PCT: 9 %
Lymphs Abs: 1.9 10*3/uL (ref 0.7–4.0)
MCH: 32 pg (ref 26.0–34.0)
MCHC: 35.1 g/dL (ref 30.0–36.0)
MCV: 91.1 fL (ref 78.0–100.0)
Monocytes Absolute: 1.2 10*3/uL — ABNORMAL HIGH (ref 0.1–1.0)
Monocytes Relative: 6 %
NEUTROS PCT: 85 %
Neutro Abs: 17.2 10*3/uL — ABNORMAL HIGH (ref 1.7–7.7)
PLATELETS: 179 10*3/uL (ref 150–400)
RBC: 4.72 MIL/uL (ref 4.22–5.81)
RDW: 14 % (ref 11.5–15.5)
WBC: 20.3 10*3/uL — AB (ref 4.0–10.5)

## 2017-10-22 LAB — MAGNESIUM: MAGNESIUM: 1.8 mg/dL (ref 1.7–2.4)

## 2017-10-22 IMAGING — CR DG CHEST 2V
2 series · 2 of 2 positions shown · non-contrast
Comparison: Chest CT [DATE]

CLINICAL DATA: Shortness of breath.  History of COPD.

EXAM:
CHEST - 2 VIEW

[w chest pa]
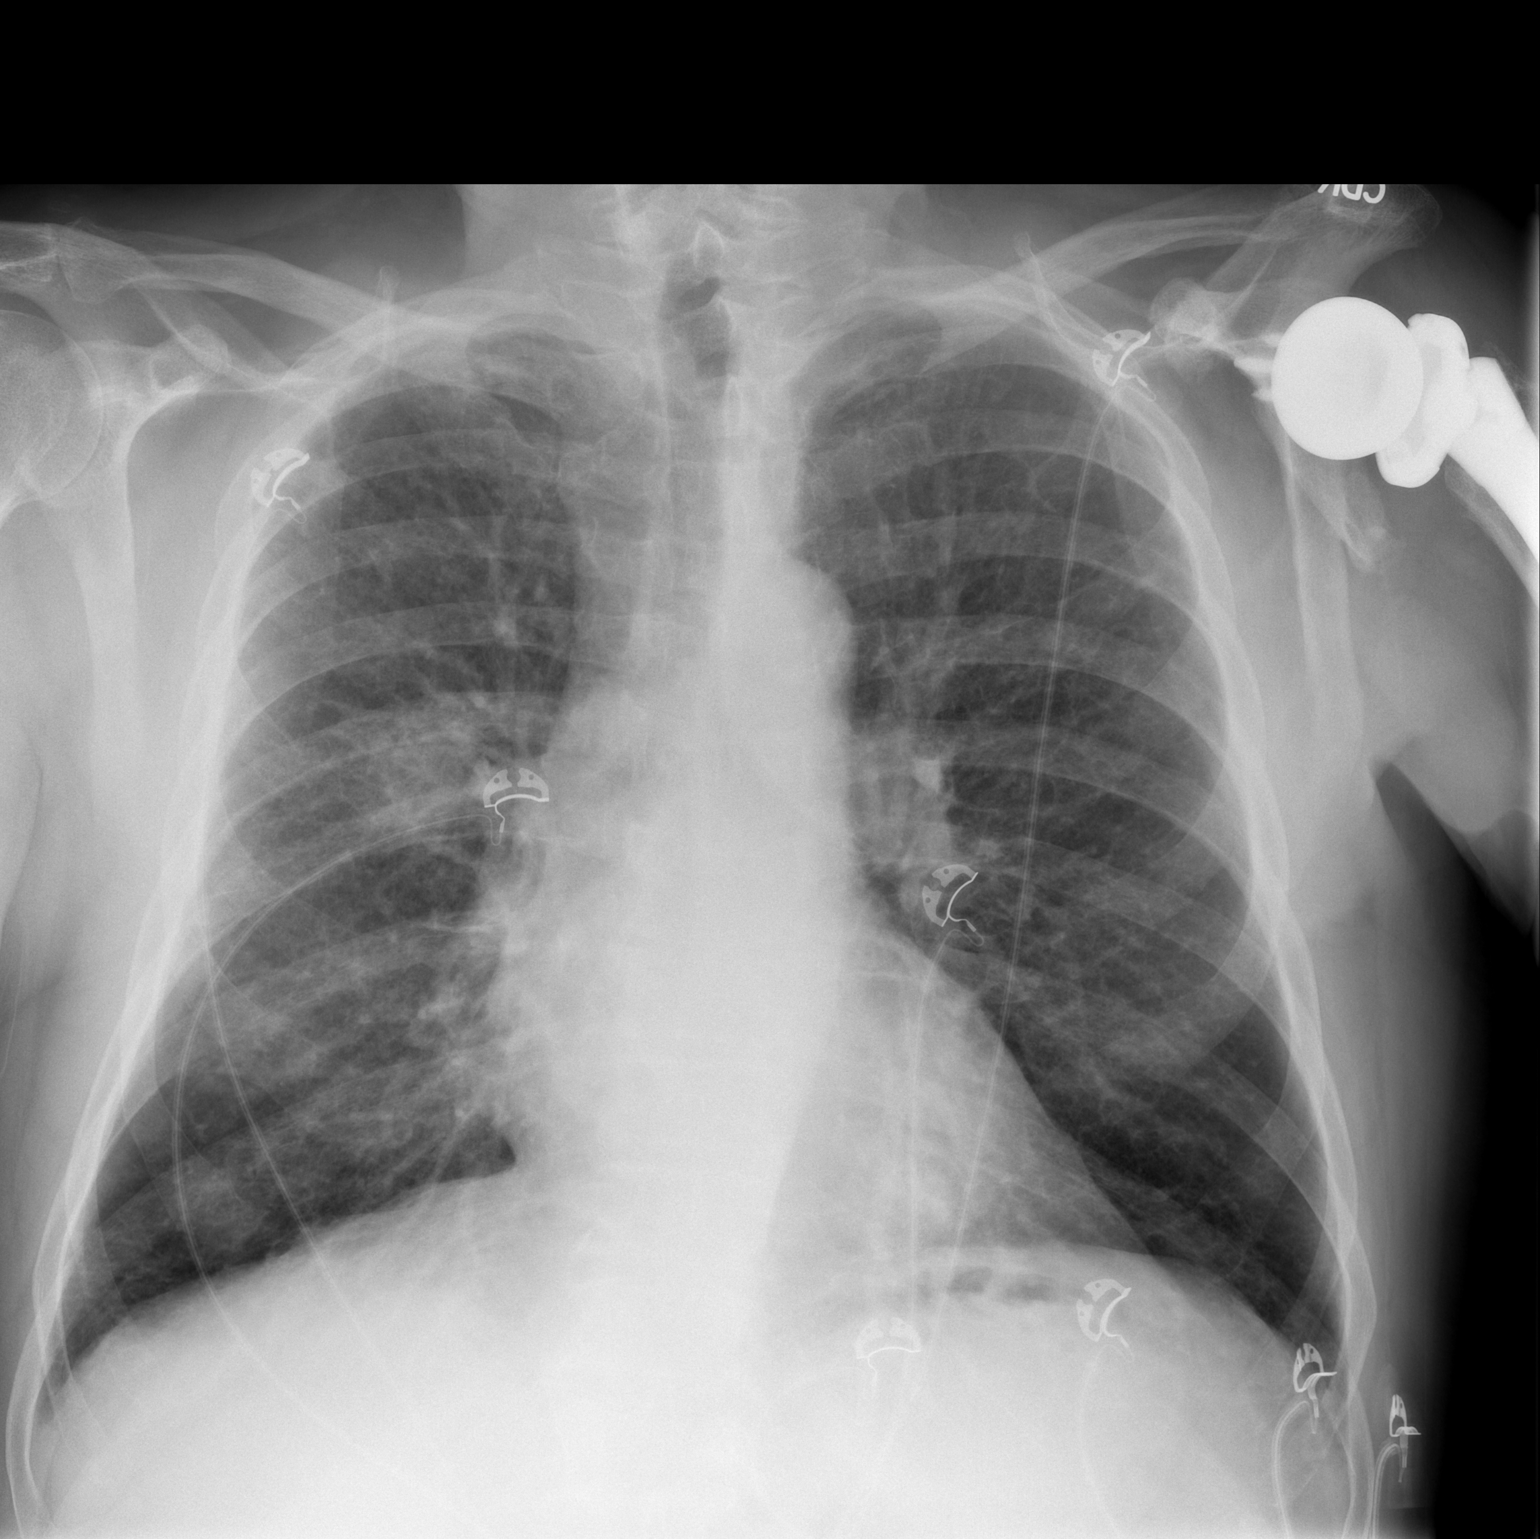

[w chest lat]
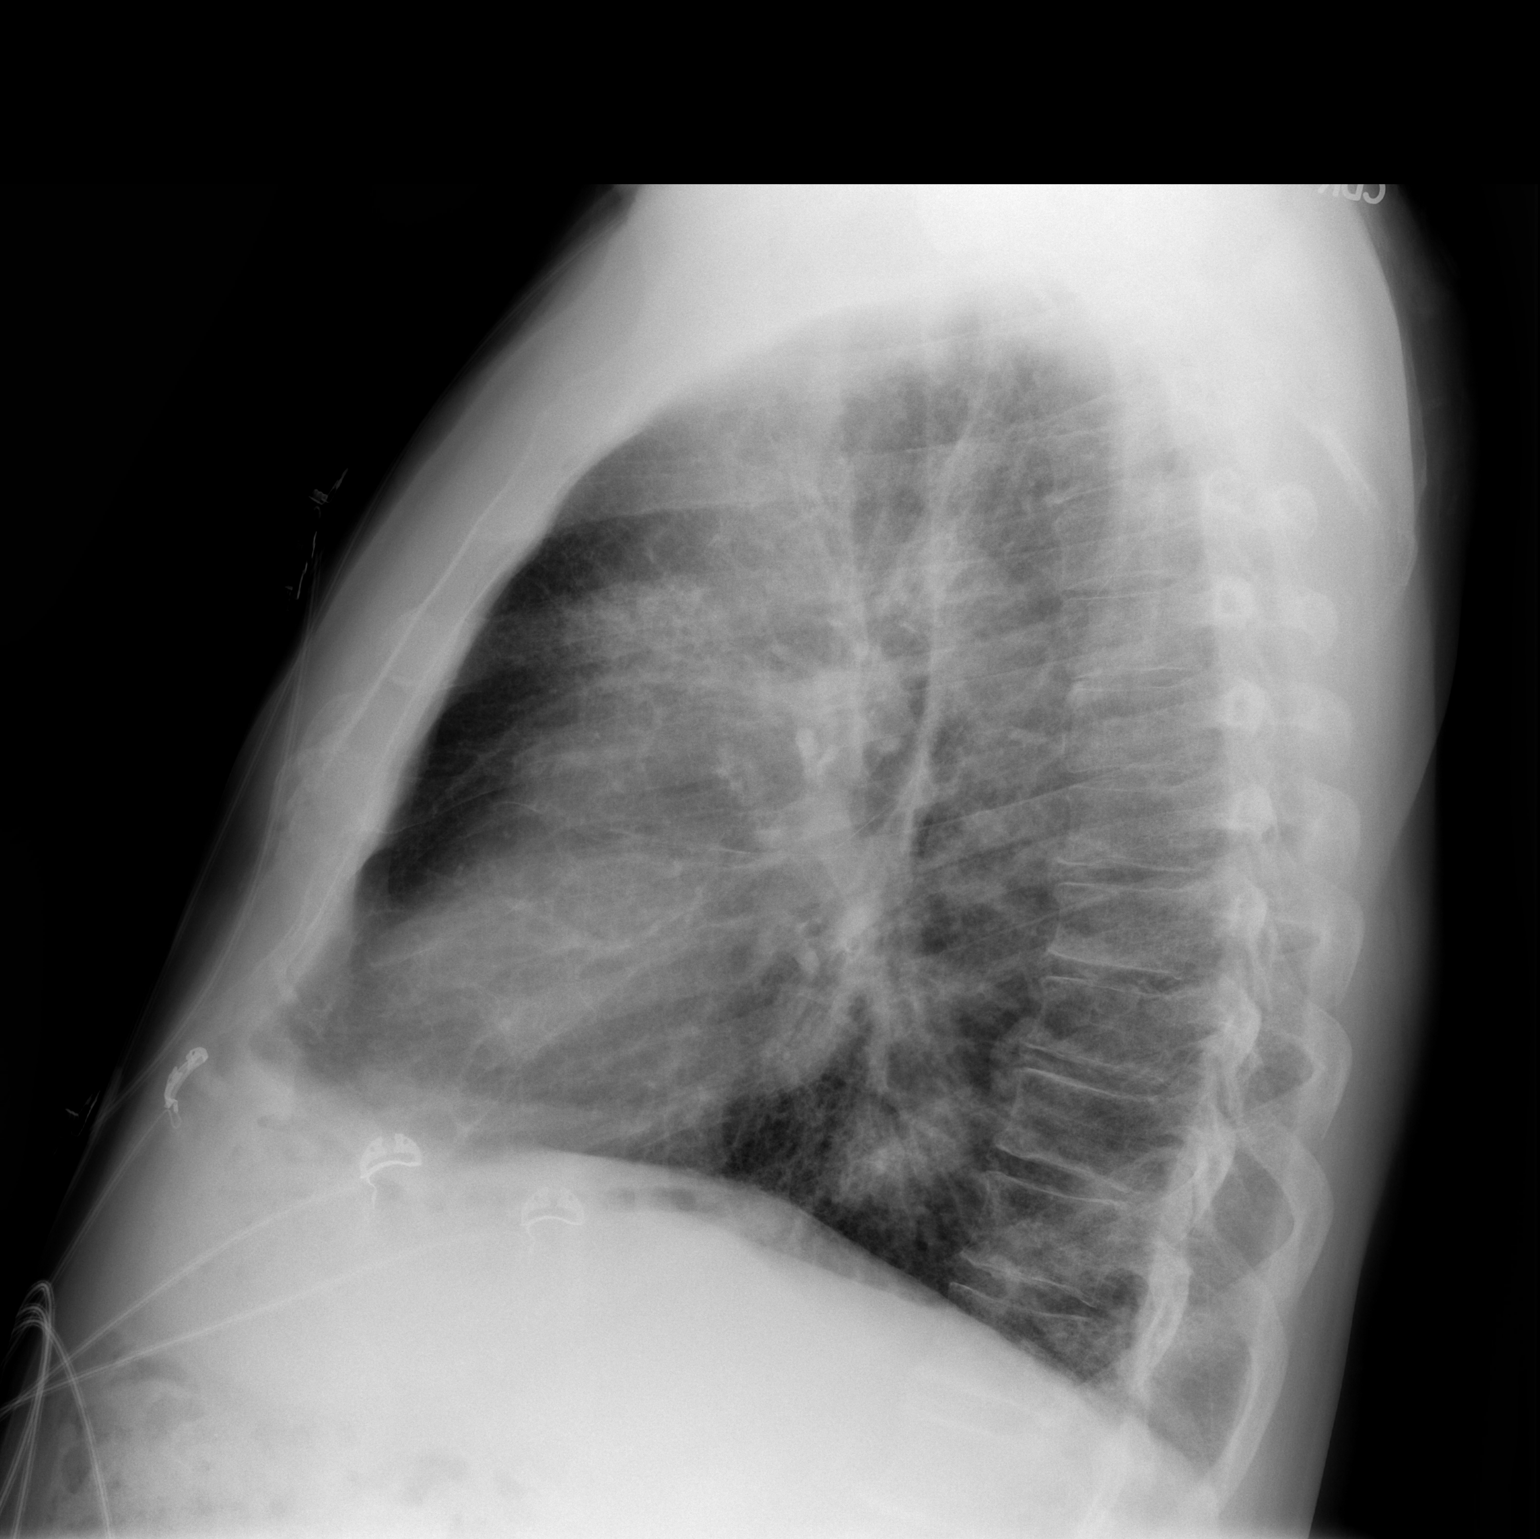

[2 of 2 positions shown; findings below may reference images not displayed]

FINDINGS: Lungs are hyperinflated with bronchial thickening. There is rounded
right perihilar soft tissue prominence. No pleural fluid or
pneumothorax. No acute osseous abnormalities. Reverse left shoulder
arthroplasty.
IMPRESSION: 1. Rounded right perihilar soft tissue prominence is nonspecific and
may represent hilar adenopathy, enlarged pulmonary artery, or
central lung mass. Recommend chest CT with contrast for
characterization.
2. Hyperinflation and bronchial thickening, imaging findings
suggesting COPD.

## 2017-10-22 MED ORDER — METHYLPREDNISOLONE SODIUM SUCC 125 MG IJ SOLR
125.0000 mg | Freq: Once | INTRAMUSCULAR | Status: AC
  Administered 2017-10-22: 125 mg via INTRAVENOUS
  Filled 2017-10-22: qty 2

## 2017-10-22 MED ORDER — LEVALBUTEROL HCL 0.63 MG/3ML IN NEBU
0.6300 mg | INHALATION_SOLUTION | Freq: Once | RESPIRATORY_TRACT | Status: AC
  Administered 2017-10-22: 0.63 mg via RESPIRATORY_TRACT
  Filled 2017-10-22: qty 3

## 2017-10-22 MED ORDER — IPRATROPIUM BROMIDE 0.02 % IN SOLN
0.5000 mg | Freq: Once | RESPIRATORY_TRACT | Status: AC
  Administered 2017-10-22: 0.5 mg via RESPIRATORY_TRACT
  Filled 2017-10-22: qty 2.5

## 2017-10-22 MED ORDER — ALBUTEROL SULFATE (2.5 MG/3ML) 0.083% IN NEBU: 5.0000 mg | INHALATION_SOLUTION | Freq: Once | RESPIRATORY_TRACT | Status: DC

## 2017-10-22 MED ORDER — ACETAMINOPHEN 500 MG PO TABS
1000.0000 mg | ORAL_TABLET | Freq: Once | ORAL | Status: AC
  Administered 2017-10-22: 1000 mg via ORAL
  Filled 2017-10-22: qty 2

## 2017-10-22 NOTE — ED Provider Notes (Signed)
[facility] EMERGENCY DEPARTMENT Provider Note   CSN: [id number] Arrival date & time: 10/22/17  2222     History   Chief Complaint Chief Complaint  Patient presents with  . Shortness of Breath    HPI Frederick Guzman is a 67 y.o. male.  The history is provided by the patient and the spouse. The history is limited by the condition of the patient.  Shortness of Breath  This is a recurrent problem. The problem occurs continuously.The current episode started more than 2 days ago. The problem has been rapidly worsening. Associated symptoms include wheezing. Pertinent negatives include no fever, no chest pain, no vomiting and no leg swelling. The problem's precipitants include medical treatment. He has tried beta-agonist inhalers and oral steroids for the symptoms. The treatment provided no relief. He has had prior hospitalizations. He has had prior ED visits. He has had no prior ICU admissions. Associated medical issues include COPD.  Patient with COPD who just finished a Zpak and is on oral steroids presents with worsening COPD and wheezing.  No swelling.  No f/c/r.    Past Medical History:  Diagnosis Date  . Arthritis    OA  . Asthma   . Cancer ([place]) 08/2014   melanoma on R shoulder & back - [facility]   . COPD (chronic obstructive pulmonary disease) ([place])   . Depression    pt. denies- 01/2016  . Diabetes mellitus without complication ([place])   . Diverticulitis   . Dyspnea    with exertion  . Fibromyalgia   . GERD (gastroesophageal reflux disease)   . Hyperlipemia   . Hypertension    "never was high"  . Pneumonia 06/2015  . Sleep apnea    last study - Nov. 2017, waiting for a new machine, using the old CPAP q night after cleaning the device after he was documented to have pneumonia from his CPAP machine.     Patient Active Problem List   Diagnosis Date Noted  . 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 Positive depression screening 09/21/2016  . Uncontrolled type 2  diabetes mellitus with hyperglycemia, without long-term current use of insulin ([place]) 09/21/2016  . Type 2 diabetes mellitus without complications ([place]) 45/80/9983  . Status post shoulder surgery 08/09/2016  . Prosthetic shoulder infection, subsequent encounter 08/09/2016  . Infection and inflammatory reaction due to internal orthopedic device, implant, and graft ([place]) 03/31/2016  . S/P shoulder replacement, left 01/30/2016  . HCAP (healthcare-associated pneumonia) 07/13/2015  . CAP (community acquired pneumonia) 07/13/2015  . Depression   . HTN (hypertension)   . Asthma exacerbation   . MDD (major depressive disorder) ([place]) 04/30/2011  . GERD 11/23/2010  . DERMATITIS, ATOPIC 11/23/2010  . TOBACCO ABUSE 03/10/2007  . OBSTRUCTIVE SLEEP APNEA 03/10/2007  . BRONCHITIS, CHRONIC 03/10/2007  . ASTHMA, MODERATE 03/10/2007    Past Surgical History:  Procedure Laterality Date  . carpel tunnel Bilateral   . COLONOSCOPY W/ POLYPECTOMY    . COLONOSCOPY W/ POLYPECTOMY    . EXCISIONAL TOTAL SHOULDER ARTHROPLASTY WITH ANTIBIOTIC SPACER Left 08/09/2016   Procedure: LEFT SHOULDER HARDWARE REMOVAL, IRRIGATION AND DEBRIDEMENT, PLACEMENT OF ANTIBIOTIC SPACER;  Surgeon: Netta Cedars, MD;  Location: [facility];  Service: Orthopedics;  Laterality: Left;  . IRRIGATION AND DEBRIDEMENT SHOULDER Left 03/31/2016   Procedure: IRRIGATION AND DEBRIDEMENT SHOULDER, deep cultures and polyethylene exchange;  Surgeon: Netta Cedars, MD;  Location: [facility];  Service: Orthopedics;  Laterality: Left;  requests 40mins  . REVERSE SHOULDER ARTHROPLASTY Left 01/30/2016  Procedure: LEFT REVERSE SHOULDER ARTHROPLASTY;  Surgeon: Netta Cedars, MD;  Location: [facility];  Service: Orthopedics;  Laterality: Left;  . ROTATOR CUFF REPAIR Left   . TOTAL SHOULDER REVISION Left 01/17/2017   Procedure: LEFT SHOULDER REVISION REVERSE TOTAL SHOULDER ARTHROPLASTY;  Surgeon: Netta Cedars, MD;  Location: [facility];  Service: Orthopedics;  Laterality: Left;    . WRIST SURGERY Right 11/2014   Dr. Amedeo Plenty        Home Medications    Prior to Admission medications   Medication Sig Start Date End Date Taking? Authorizing Provider  albuterol (PROAIR HFA) 108 (90 Base) MCG/ACT inhaler Inhale 2 puffs into the lungs every 6 (six) hours as needed for wheezing or shortness of breath.    [provider]  aspirin EC 81 MG tablet Take 81 mg by mouth every other day.     [provider]  atorvastatin (LIPITOR) 20 MG tablet Take 1 tablet (20 mg total) by mouth every other day. 09/06/17   [name], DO  b complex vitamins tablet Take 1 tablet by mouth daily.    [provider]  budesonide-formoterol (SYMBICORT) 160-4.5 MCG/ACT inhaler Inhale 2 puffs into the lungs 2 (two) times daily.    [provider]  buPROPion (WELLBUTRIN XL) 300 MG 24 hr tablet Take 1 tablet (300 mg total) by mouth daily. 09/06/17   [name], DO  cetirizine (ZYRTEC) 10 MG tablet Take 10 mg by mouth daily as needed for allergies (seasonal allergies).     [provider]  DULoxetine (CYMBALTA) 30 MG capsule Take 1 capsule (30 mg total) by mouth daily. 09/06/17   [name], DO  esomeprazole (NEXIUM) 40 MG capsule Take 1 capsule (40 mg total) by mouth daily. 09/06/17   [name], DO  fluticasone [facility]) 50 MCG/ACT nasal spray Place 1 spray into both nostrils daily as needed for allergies. 12/30/15   [provider]  glimepiride (AMARYL) 2 MG tablet Take 1 tablet (2 mg total) by mouth daily. 09/06/17   [name], DO  glucose blood test strip Use up to 4 times per day as directed with glucometer. Disp: 100. Refill x99 Patient taking differently: as directed with glucometer. Disp: 100. Refill x99 01/06/17   [name], DO  ibuprofen (ADVIL,MOTRIN) 200 MG tablet Take 600-800 mg by mouth every 6 (six) hours as needed for moderate pain (depends on pain if takes 3-4 tablets).    [provider]  ipratropium-albuterol (DUONEB) 0.5-2.5 (3) MG/3ML SOLN Take 3 mLs by nebulization every 6 (six) hours as needed. Patient taking differently: Take 3 mLs by nebulization every 6 (six) hours as needed (for shortness of breath).  07/13/15   Caren Griffins, MD  Lancets (FREESTYLE) lancets Use as instructed up to tid 09/21/16   [name], DO  lisinopril (PRINIVIL,ZESTRIL) 2.5 MG tablet Take 1 tablet (2.5 mg total) by mouth at bedtime. 09/06/17   [name], DO  Multiple Vitamin (MULTIVITAMIN WITH MINERALS) TABS tablet Take 1 tablet by mouth at bedtime.    [provider]  Omega-3 Fatty Acids (FISH OIL) 1000 MG CAPS Take 1,000 mg by mouth at bedtime.     [provider]  sildenafil (REVATIO) 20 MG tablet Take 0.5-4 tablets (10-80 mg total) by mouth daily as needed. Prior to sex 03/24/17   [name], DO  tadalafil (CIALIS) 5 MG tablet Take 1 tablet (5 mg total) by mouth daily. Use daily. 10/17/17   [name], DO  tamsulosin [facility]) 0.4  MG CAPS capsule Take 1 capsule (0.4 mg total) by mouth 2 (two) times daily. 09/06/17   [name], DO  Testosterone 30 MG/ACT SOLN Apply 1-2 Pump topically daily. Alternates taking, 1 pump under one arm one day, 2 pumps the next day (1 pump under each arm). 09/07/17   [name], DO  vitamin C (ASCORBIC ACID) 500 MG tablet Take 500 mg by mouth daily.    [provider]    Family History Family History  Problem Relation Age of Onset  . Diabetes Mother   . Colon cancer Father   . Prostate cancer Father   . Diabetes Father     Social History Social History   Tobacco Use  . Smoking status: Current Every Day Smoker    Packs/day: 2.00    Years: 42.00    Pack years: 84.00    Types: Cigarettes  . Smokeless tobacco: Never Used  . Tobacco comment: pt no longer taking chantix  Substance Use Topics  . Alcohol use: Yes    Alcohol/week: 4.2 oz    Types: 4 Cans of beer, 3 Shots of liquor per  week    Comment: beer or mixed drink   . Drug use: No     Allergies   Methotrexate derivatives; Tramadol; Methotrexate; and Ultram [tramadol hcl]   Review of Systems Review of Systems  Constitutional: Negative for fever.  Respiratory: Positive for shortness of breath and wheezing.   Cardiovascular: Negative for chest pain, palpitations and leg swelling.  Gastrointestinal: Negative for vomiting.  All other systems reviewed and are negative.    Physical Exam Updated Vital Signs BP 120/72 (BP Location: Right Arm)   Pulse (!) 125   Temp 100.1 F (37.8 C) (Oral)   Resp 18   Ht 6' (1.829 m)   Wt 90.3 kg (199 lb)   SpO2 96%   BMI 26.99 kg/m   Physical Exam  Constitutional: He appears well-developed and well-nourished.  HENT:  Head: Normocephalic and atraumatic.  Nose: Nose normal.  Eyes: Pupils are equal, round, and reactive to light. Conjunctivae are normal.  Neck: Normal range of motion. Neck supple. No JVD present.  Cardiovascular: Regular rhythm and normal heart sounds. Tachycardia present.  Pulmonary/Chest: Tachypnea noted. He has wheezes. He has no rales.  Abdominal: Soft. Bowel sounds are normal. There is no tenderness.  Musculoskeletal: Normal range of motion. He exhibits no edema, tenderness or deformity.  Lymphadenopathy:    He has no cervical adenopathy.  Neurological: He is alert. He displays normal reflexes.  Skin: Skin is warm. Capillary refill takes less than 2 seconds. He is not diaphoretic.  Psychiatric: He has a normal mood and affect.     ED Treatments / Results  Labs (all labs ordered are listed, but only abnormal results are displayed) Results for orders placed or performed during the hospital encounter of 10/22/17  CBC with Differential/Platelet  Result Value Ref Range   WBC 20.3 (H) 4.0 - 10.5 K/uL   RBC 4.72 4.22 - 5.81 MIL/uL   Hemoglobin 15.1 13.0 - 17.0 g/dL   HCT 43.0 39.0 - 52.0 %   MCV 91.1 78.0 - 100.0 fL   MCH 32.0 26.0 - 34.0 pg    MCHC 35.1 30.0 - 36.0 g/dL   RDW 14.0 11.5 - 15.5 %   Platelets 179 150 - 400 K/uL   Neutrophils Relative % 85 %   Neutro Abs 17.2 (H) 1.7 - 7.7 K/uL   Lymphocytes Relative 9 %  Lymphs Abs 1.9 0.7 - 4.0 K/uL   Monocytes Relative 6 %   Monocytes Absolute 1.2 (H) 0.1 - 1.0 K/uL   Eosinophils Relative 0 %   Eosinophils Absolute 0.0 0.0 - 0.7 K/uL   Basophils Relative 0 %   Basophils Absolute 0.0 0.0 - 0.1 K/uL  Basic metabolic panel  Result Value Ref Range   Sodium 138 135 - 145 mmol/L   Potassium 3.5 3.5 - 5.1 mmol/L   Chloride 102 98 - 111 mmol/L   CO2 26 22 - 32 mmol/L   Glucose, Bld 131 (H) 70 - 99 mg/dL   BUN 20 8 - 23 mg/dL   Creatinine, Ser 1.08 0.61 - 1.24 mg/dL   Calcium 8.3 (L) 8.9 - 10.3 mg/dL   GFR calc non Af Amer >60 >60 mL/min   GFR calc Af Amer >60 >60 mL/min   Anion gap 10 5 - 15  Brain natriuretic peptide  Result Value Ref Range   B Natriuretic Peptide 40.3 0.0 - 100.0 pg/mL  Troponin I  Result Value Ref Range   Troponin I <0.03 <0.03 ng/mL  Magnesium  Result Value Ref Range   Magnesium 1.8 1.7 - 2.4 mg/dL   Dg Chest 2 View  Result Date: 10/23/2017 CLINICAL DATA:  Shortness of breath.  History of COPD. EXAM: CHEST - 2 VIEW COMPARISON:  Chest CT 07/13/2015 FINDINGS: Lungs are hyperinflated with bronchial thickening. There is rounded right perihilar soft tissue prominence. No pleural fluid or pneumothorax. No acute osseous abnormalities. Reverse left shoulder arthroplasty. IMPRESSION: 1. Rounded right perihilar soft tissue prominence is nonspecific and may represent hilar adenopathy, enlarged pulmonary artery, or central lung mass. Recommend chest CT with contrast for characterization. 2. Hyperinflation and bronchial thickening, imaging findings suggesting COPD. Electronically Signed   By: Jeb Levering M.D.   On: 10/23/2017 00:02   Ct Angio Chest Pe W And/or Wo Contrast  Result Date: 10/23/2017 CLINICAL DATA:  Shortness of breath. EXAM: CT ANGIOGRAPHY  CHEST WITH CONTRAST TECHNIQUE: Multidetector CT imaging of the chest was performed using the standard protocol during bolus administration of intravenous contrast. Multiplanar CT image reconstructions and MIPs were obtained to evaluate the vascular anatomy. CONTRAST:  56mL ISOVUE-370 IOPAMIDOL (ISOVUE-370) INJECTION 76% COMPARISON:  None. FINDINGS: Cardiovascular: The heart size is normal. No substantial pericardial effusion. Coronary artery calcification is evident. Atherosclerotic calcification is noted in the wall of the thoracic aorta. No filling defect within the opacified pulmonary arteries to suggest the presence of an acute pulmonary embolus. Mediastinum/Nodes: 14 mm short axis precarinal lymph node is associated with a 10 mm short axis prevascular node. Scattered small mediastinal and bilateral hilar lymph nodes are evident. The esophagus has normal imaging features. There is no axillary lymphadenopathy. Lungs/Pleura: Areas of subpleural reticulation noted bilaterally. Focal masslike airspace disease is identified in the right upper lobe in both lower lobes. There is prominent airway impaction in the right lower lobe. 11 mm peripheral nodule is seen in the right upper lobe on series 5: Image 24. 7 mm right lower lobe nodule (5:78) no pleural effusion. 5 mm left upper lobe nodule (5:36). Upper Abdomen: Unremarkable. Musculoskeletal: Left shoulder replacement. No worrisome lytic or sclerotic osseous abnormality. Review of the MIP images confirms the above findings. IMPRESSION: 1. Multifocal masslike areas of airspace opacity. Imaging features may reflect multifocal pneumonia or inflammatory process. Neoplasm not excluded but considered less likely. Continued close attention will be required. 2. Bilateral pulmonary nodules measuring up to 7 mm. Attention on follow-up recommended.  3. Prominent airway impaction right lower lobe. This may be infectious/inflammatory although aspiration could have this appearance.  4. Areas of subpleural reticulation bilaterally suggest underlying fibrotic lung disease. 5.  Aortic Atherosclerois (ICD10-170.0) Electronically Signed   By: Misty Stanley M.D.   On: 10/23/2017 01:02    EKG EKG Interpretation  Date/Time:  Saturday October 22 2017 22:28:54 EDT Ventricular Rate:  125 PR Interval:  176 QRS Duration: 98 QT Interval:  290 QTC Calculation: 418 R Axis:   114 Text Interpretation:  Sinus tachycardia Right axis deviation Incomplete right bundle branch block Possible Right ventricular hypertrophy Confirmed by Dory Horn) on 10/22/2017 11:03:04 PM   Radiology No results found.  Procedures Procedures (including critical care time)  Medications Ordered in ED Medications  0.9 %  sodium chloride infusion (500 mLs Intravenous New Bag/Given 10/23/17 0018)  cefTRIAXone (ROCEPHIN) 1 g in sodium chloride 0.9 % 100 mL IVPB (has no administration in time range)  azithromycin (ZITHROMAX) 500 mg in sodium chloride 0.9 % 250 mL IVPB (has no administration in time range)  azithromycin (ZITHROMAX) 500 MG injection (has no administration in time range)  methylPREDNISolone sodium succinate (SOLU-MEDROL) 125 mg/2 mL injection 125 mg (125 mg Intravenous Given 10/22/17 2329)  levalbuterol (XOPENEX) nebulizer solution 0.63 mg (0.63 mg Nebulization Given 10/22/17 2308)  ipratropium (ATROVENT) nebulizer solution 0.5 mg (0.5 mg Nebulization Given 10/22/17 2308)  acetaminophen (TYLENOL) tablet 1,000 mg (1,000 mg Oral Given 10/22/17 2328)  magnesium sulfate IVPB 2 g 50 mL (2 g Intravenous New Bag/Given 10/23/17 0019)  iopamidol (ISOVUE-370) 76 % injection 100 mL (73 mLs Intravenous Contrast Given 10/23/17 0027)  levalbuterol (XOPENEX) nebulizer solution 0.63 mg (0.63 mg Nebulization Given 10/23/17 0052)  ipratropium (ATROVENT) nebulizer solution 0.5 mg (0.5 mg Nebulization Given 10/23/17 0052)    Final Clinical Impressions(s) / ED Diagnoses   Given CT findings and ongoing wheezing will admit  for nebs and IV antibiotics.     Shya Kovatch, MD 10/23/17 8280

## 2017-10-22 NOTE — Progress Notes (Signed)
Patient states that he breathing better after nebulizer treatment.

## 2017-10-22 NOTE — ED Triage Notes (Signed)
Pt presents with cough and SOB. States usual meds aren't helping. States just finished z-pack and is still on prednisone

## 2017-10-22 NOTE — ED Notes (Addendum)
Alert, NAD, calm, interactive, resps e/u, speaking in clear complete sentences, no dyspnea noted, skin W&D, VSS, here for flu like sx, sob, wheezing, cough, breathing tx complete, RT & EDP at [facility]. Family at [facility]. Pt to xray.

## 2017-10-22 NOTE — ED Notes (Signed)
RT at BS.

## 2017-10-23 ENCOUNTER — Emergency Department (HOSPITAL_BASED_OUTPATIENT_CLINIC_OR_DEPARTMENT_OTHER): Payer: Medicare Other

## 2017-10-23 DIAGNOSIS — E119 Type 2 diabetes mellitus without complications: Secondary | ICD-10-CM

## 2017-10-23 DIAGNOSIS — Z885 Allergy status to narcotic agent status: Secondary | ICD-10-CM | POA: Diagnosis not present

## 2017-10-23 DIAGNOSIS — F17210 Nicotine dependence, cigarettes, uncomplicated: Secondary | ICD-10-CM | POA: Diagnosis present

## 2017-10-23 DIAGNOSIS — J440 Chronic obstructive pulmonary disease with acute lower respiratory infection: Secondary | ICD-10-CM | POA: Diagnosis present

## 2017-10-23 DIAGNOSIS — R911 Solitary pulmonary nodule: Secondary | ICD-10-CM | POA: Diagnosis present

## 2017-10-23 DIAGNOSIS — J189 Pneumonia, unspecified organism: Secondary | ICD-10-CM

## 2017-10-23 DIAGNOSIS — Z79899 Other long term (current) drug therapy: Secondary | ICD-10-CM | POA: Diagnosis not present

## 2017-10-23 DIAGNOSIS — F17200 Nicotine dependence, unspecified, uncomplicated: Secondary | ICD-10-CM

## 2017-10-23 DIAGNOSIS — Z888 Allergy status to other drugs, medicaments and biological substances status: Secondary | ICD-10-CM | POA: Diagnosis not present

## 2017-10-23 DIAGNOSIS — Z96612 Presence of left artificial shoulder joint: Secondary | ICD-10-CM | POA: Diagnosis present

## 2017-10-23 DIAGNOSIS — Z7982 Long term (current) use of aspirin: Secondary | ICD-10-CM | POA: Diagnosis not present

## 2017-10-23 DIAGNOSIS — J441 Chronic obstructive pulmonary disease with (acute) exacerbation: Secondary | ICD-10-CM | POA: Diagnosis not present

## 2017-10-23 DIAGNOSIS — R918 Other nonspecific abnormal finding of lung field: Secondary | ICD-10-CM | POA: Diagnosis not present

## 2017-10-23 DIAGNOSIS — I10 Essential (primary) hypertension: Secondary | ICD-10-CM | POA: Diagnosis present

## 2017-10-23 DIAGNOSIS — R0602 Shortness of breath: Secondary | ICD-10-CM | POA: Diagnosis not present

## 2017-10-23 DIAGNOSIS — K219 Gastro-esophageal reflux disease without esophagitis: Secondary | ICD-10-CM | POA: Diagnosis present

## 2017-10-23 DIAGNOSIS — M797 Fibromyalgia: Secondary | ICD-10-CM | POA: Diagnosis present

## 2017-10-23 DIAGNOSIS — Z833 Family history of diabetes mellitus: Secondary | ICD-10-CM | POA: Diagnosis not present

## 2017-10-23 DIAGNOSIS — E785 Hyperlipidemia, unspecified: Secondary | ICD-10-CM | POA: Diagnosis present

## 2017-10-23 DIAGNOSIS — G4733 Obstructive sleep apnea (adult) (pediatric): Secondary | ICD-10-CM | POA: Diagnosis present

## 2017-10-23 DIAGNOSIS — J159 Unspecified bacterial pneumonia: Secondary | ICD-10-CM | POA: Diagnosis not present

## 2017-10-23 DIAGNOSIS — Z85820 Personal history of malignant melanoma of skin: Secondary | ICD-10-CM | POA: Diagnosis not present

## 2017-10-23 DIAGNOSIS — Z79890 Hormone replacement therapy (postmenopausal): Secondary | ICD-10-CM | POA: Diagnosis not present

## 2017-10-23 DIAGNOSIS — A419 Sepsis, unspecified organism: Secondary | ICD-10-CM | POA: Diagnosis present

## 2017-10-23 DIAGNOSIS — Z716 Tobacco abuse counseling: Secondary | ICD-10-CM | POA: Diagnosis not present

## 2017-10-23 DIAGNOSIS — Z7951 Long term (current) use of inhaled steroids: Secondary | ICD-10-CM | POA: Diagnosis not present

## 2017-10-23 HISTORY — DX: Other nonspecific abnormal finding of lung field: R91.8

## 2017-10-23 LAB — RESPIRATORY PANEL BY PCR
ADENOVIRUS-RVPPCR: NOT DETECTED
Bordetella pertussis: NOT DETECTED
CHLAMYDOPHILA PNEUMONIAE-RVPPCR: NOT DETECTED
CORONAVIRUS HKU1-RVPPCR: NOT DETECTED
CORONAVIRUS NL63-RVPPCR: NOT DETECTED
Coronavirus 229E: NOT DETECTED
Coronavirus OC43: NOT DETECTED
Influenza A: NOT DETECTED
Influenza B: NOT DETECTED
METAPNEUMOVIRUS-RVPPCR: NOT DETECTED
Mycoplasma pneumoniae: NOT DETECTED
PARAINFLUENZA VIRUS 3-RVPPCR: NOT DETECTED
Parainfluenza Virus 1: NOT DETECTED
Parainfluenza Virus 2: NOT DETECTED
Parainfluenza Virus 4: NOT DETECTED
RHINOVIRUS / ENTEROVIRUS - RVPPCR: NOT DETECTED
Respiratory Syncytial Virus: NOT DETECTED

## 2017-10-23 LAB — BASIC METABOLIC PANEL
Anion gap: 10 (ref 5–15)
BUN: 18 mg/dL (ref 8–23)
CO2: 24 mmol/L (ref 22–32)
Calcium: 8.7 mg/dL — ABNORMAL LOW (ref 8.9–10.3)
Chloride: 103 mmol/L (ref 98–111)
Creatinine, Ser: 1.08 mg/dL (ref 0.61–1.24)
GFR calc Af Amer: >60 mL/min (ref >60)
GFR calc non Af Amer: >60 mL/min (ref >60)
Glucose, Bld: 240 mg/dL — ABNORMAL HIGH (ref 70–99)
POTASSIUM: 4.9 mmol/L (ref 3.5–5.1)
Sodium: 137 mmol/L (ref 135–145)

## 2017-10-23 LAB — CBC WITH DIFFERENTIAL/PLATELET
Basophils Absolute: 0 10*3/uL (ref 0.0–0.1)
Basophils Relative: 0 %
Eosinophils Absolute: 0 10*3/uL (ref 0.0–0.7)
Eosinophils Relative: 0 %
HCT: 43.7 % (ref 39.0–52.0)
HEMOGLOBIN: 14.4 g/dL (ref 13.0–17.0)
LYMPHS ABS: 1.4 10*3/uL (ref 0.7–4.0)
LYMPHS PCT: 6 %
MCH: 31 pg (ref 26.0–34.0)
MCHC: 33 g/dL (ref 30.0–36.0)
MCV: 94.2 fL (ref 78.0–100.0)
Monocytes Absolute: 0.5 10*3/uL (ref 0.1–1.0)
Monocytes Relative: 2 %
NEUTROS PCT: 92 %
Neutro Abs: 20.9 10*3/uL — ABNORMAL HIGH (ref 1.7–7.7)
Platelets: 198 10*3/uL (ref 150–400)
RBC: 4.64 MIL/uL (ref 4.22–5.81)
RDW: 14.4 % (ref 11.5–15.5)
WBC: 22.8 10*3/uL — AB (ref 4.0–10.5)

## 2017-10-23 LAB — I-STAT CG4 LACTIC ACID, ED: LACTIC ACID, VENOUS: 1.18 mmol/L (ref 0.5–1.9)

## 2017-10-23 LAB — TROPONIN I

## 2017-10-23 LAB — GLUCOSE, CAPILLARY
Glucose-Capillary: 246 mg/dL — ABNORMAL HIGH (ref 70–99)
Glucose-Capillary: 222 mg/dL — ABNORMAL HIGH (ref 70–99)
Glucose-Capillary: 257 mg/dL — ABNORMAL HIGH (ref 70–99)
Glucose-Capillary: 237 mg/dL — ABNORMAL HIGH (ref 70–99)

## 2017-10-23 LAB — BRAIN NATRIURETIC PEPTIDE: B NATRIURETIC PEPTIDE 5: 40.3 pg/mL (ref 0.0–100.0)

## 2017-10-23 LAB — SEDIMENTATION RATE: Sed Rate: 10 mm/hr (ref 0–16)

## 2017-10-23 LAB — EXPECTORATED SPUTUM ASSESSMENT W GRAM STAIN, RFLX TO RESP C

## 2017-10-23 LAB — CBG MONITORING, ED: GLUCOSE-CAPILLARY: 259 mg/dL — AB (ref 70–99)

## 2017-10-23 LAB — STREP PNEUMONIAE URINARY ANTIGEN: STREP PNEUMO URINARY ANTIGEN: NEGATIVE

## 2017-10-23 IMAGING — CT CT ANGIO CHEST
2 of 8 series · 18 of 36 positions shown · IV contrast (iopamidol)
Comparison: None.

CLINICAL DATA: Shortness of breath.

EXAM:
CT ANGIOGRAPHY CHEST WITH CONTRAST
TECHNIQUE: Multidetector CT imaging of the chest was performed using the
standard protocol during bolus administration of intravenous
contrast. Multiplanar CT image reconstructions and MIPs were
obtained to evaluate the vascular anatomy.
CONTRAST:  73mL [G0] IOPAMIDOL ([G0]) INJECTION 76%

[Series 6: pe thins · axial · 0.85mm/px · z∈[-266,+3]mm · 17 of 301 slices shown]
[im 16/301  lung]
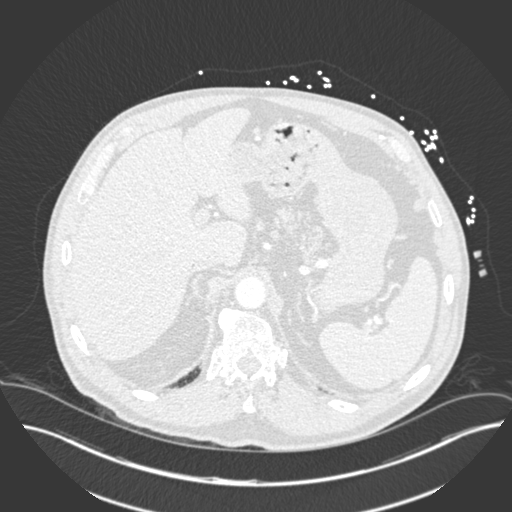
[im 32/301  mediastinal]
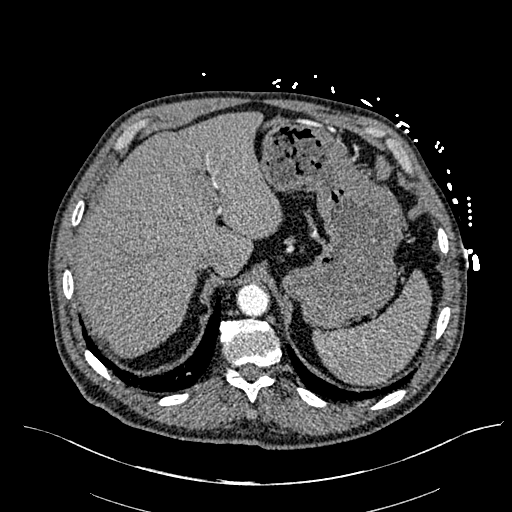
[im 48/301  lung]
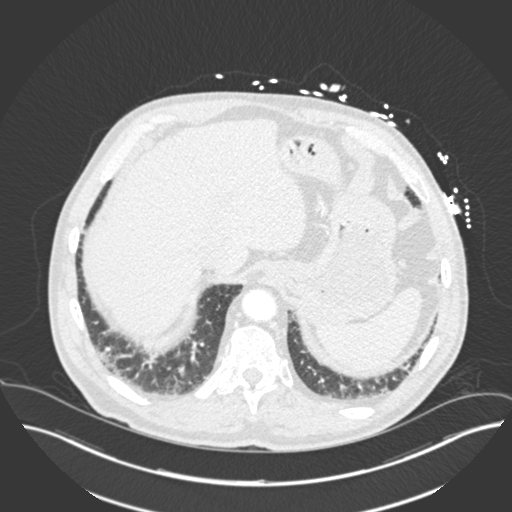
[im 64/301  mediastinal]
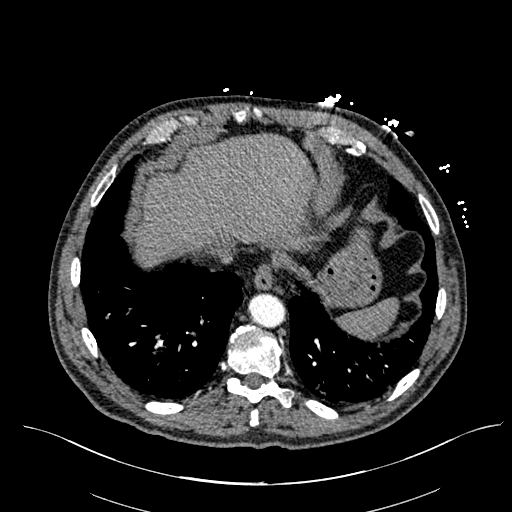
[im 79/301  lung]
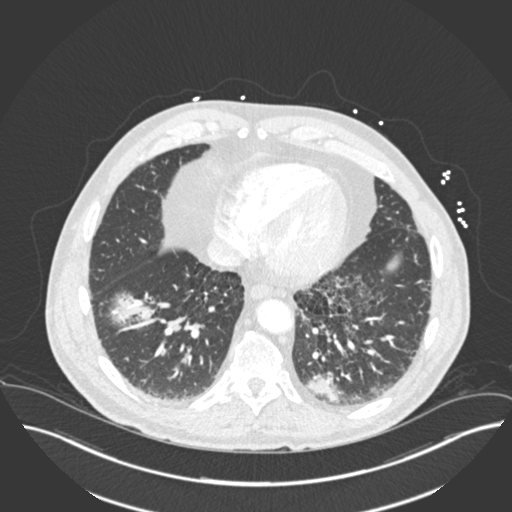
[im 95/301  mediastinal]
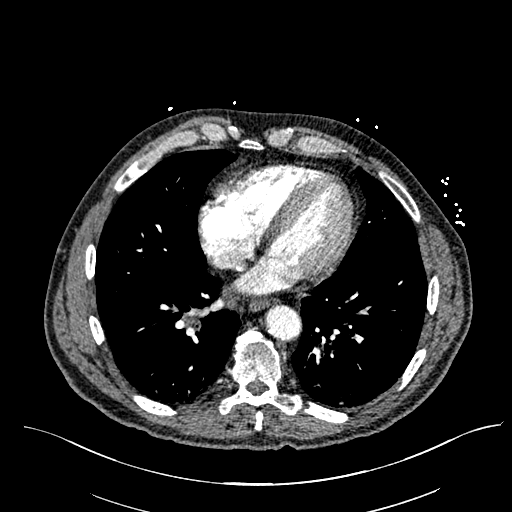
[im 111/301  lung]
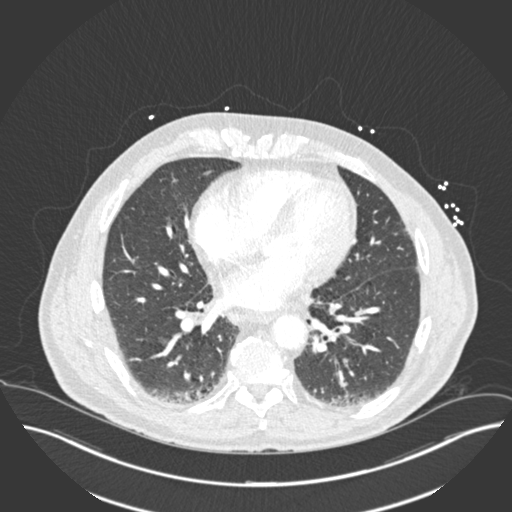
[im 127/301  mediastinal]
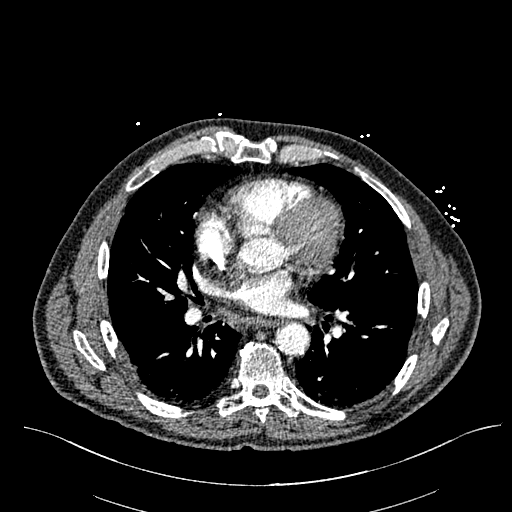
[im 158/301  lung]
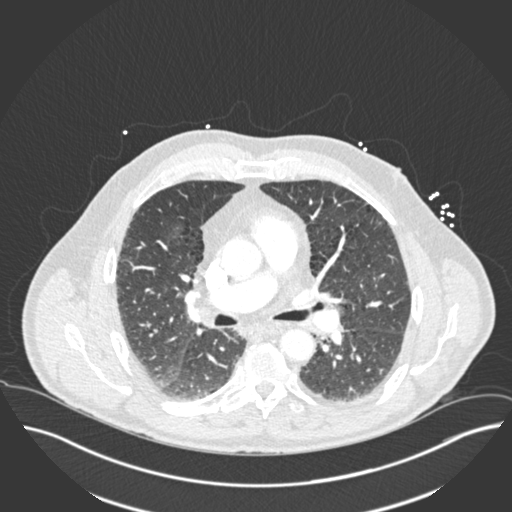
[im 174/301  mediastinal]
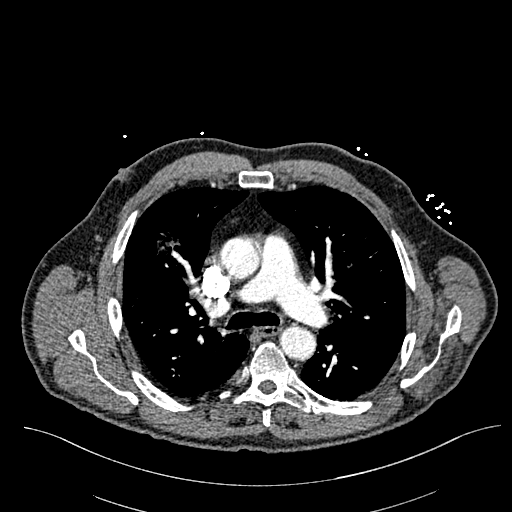
[im 190/301  lung]
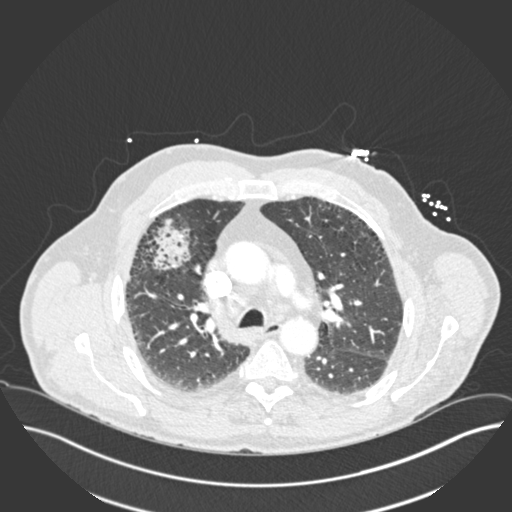
[im 206/301  mediastinal]
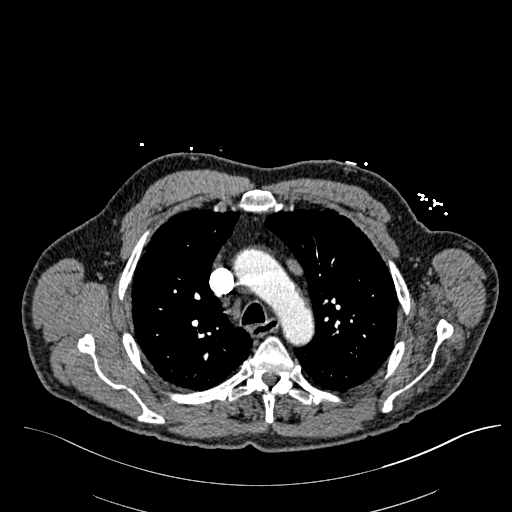
[im 222/301  lung]
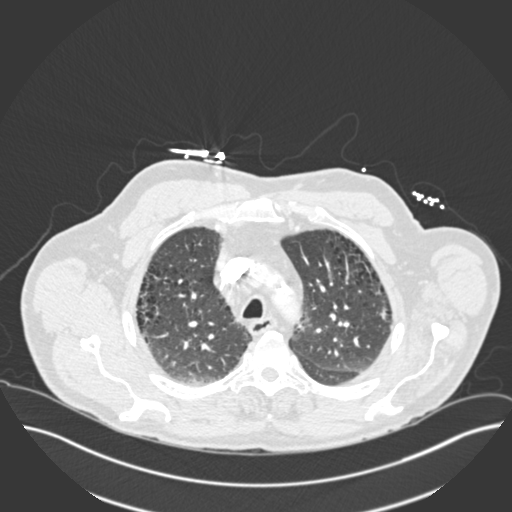
[im 237/301  mediastinal]
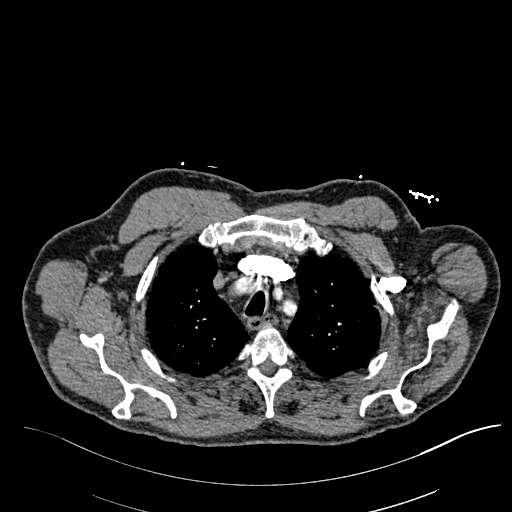
[im 253/301  lung]
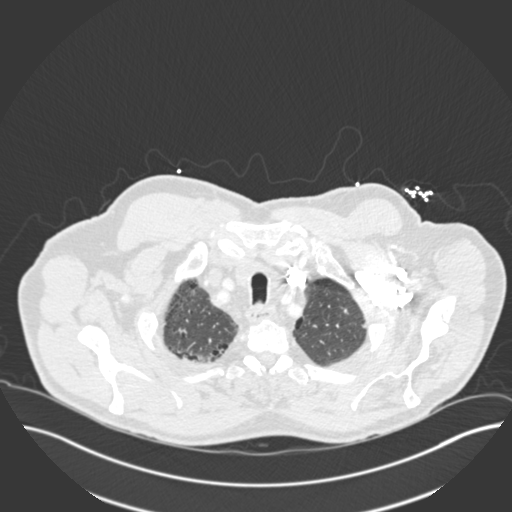
[im 269/301  mediastinal]
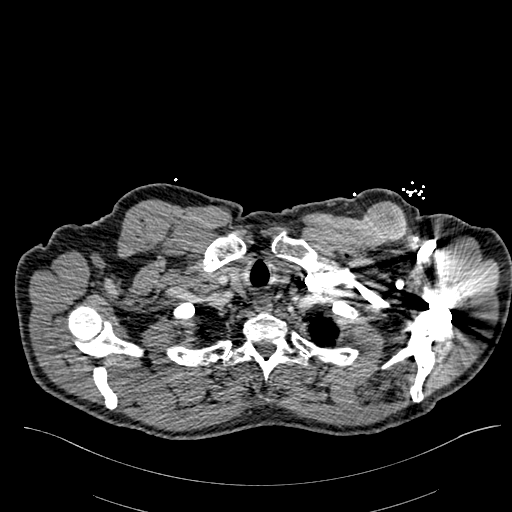
[im 285/301  lung]
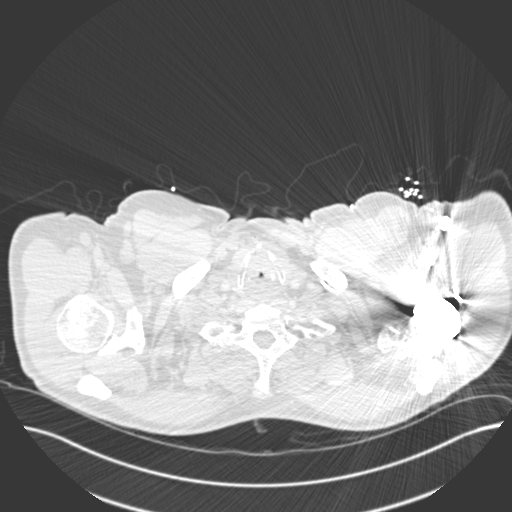

[Series 7: pe coronal mpr · coronal · 0.63mm/px · 1 of 157 slices shown]
[im 79/157  mediastinal]
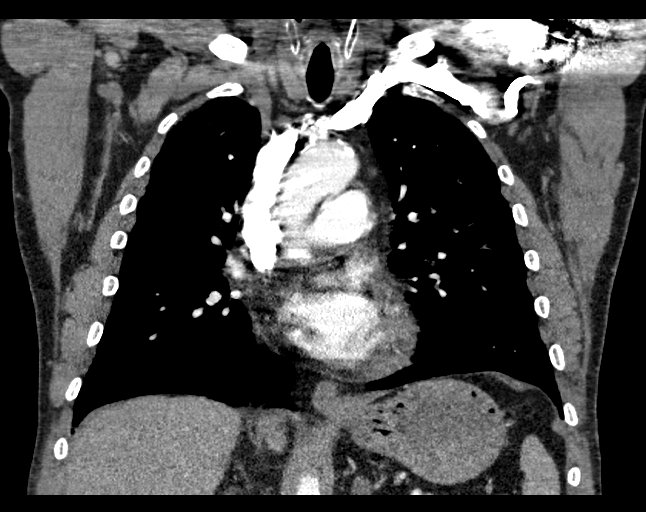

[18 of 36 positions shown; findings below may reference images not displayed]

FINDINGS: Cardiovascular: The heart size is normal. No substantial pericardial
effusion. Coronary artery calcification is evident. Atherosclerotic
calcification is noted in the wall of the thoracic aorta. No filling
defect within the opacified pulmonary arteries to suggest the
presence of an acute pulmonary embolus.

Mediastinum/Nodes: 14 mm short axis precarinal lymph node is
associated with a 10 mm short axis prevascular node. Scattered small
mediastinal and bilateral hilar lymph nodes are evident. The
esophagus has normal imaging features. There is no axillary
lymphadenopathy.

Lungs/Pleura: Areas of subpleural reticulation noted bilaterally.
Focal masslike airspace disease is identified in the right upper
lobe in both lower lobes. There is prominent airway impaction in the
right lower lobe.

11 mm peripheral nodule is seen in the right upper lobe on series 5:
Image 24. 7 mm right lower lobe nodule ([DATE]) no pleural effusion. 5
mm left upper lobe nodule ([DATE]).

Upper Abdomen: Unremarkable.

Musculoskeletal: Left shoulder replacement. No worrisome lytic or
sclerotic osseous abnormality.

Review of the MIP images confirms the above findings.
IMPRESSION: 1. Multifocal masslike areas of airspace opacity. Imaging features
may reflect multifocal pneumonia or inflammatory process. Neoplasm
not excluded but considered less likely. Continued close attention
will be required.
2. Bilateral pulmonary nodules measuring up to 7 mm. Attention on
follow-up recommended.
3. Prominent airway impaction right lower lobe. This may be
infectious/inflammatory although aspiration could have this
appearance.
4. Areas of subpleural reticulation bilaterally suggest underlying
fibrotic lung disease.
5.  Aortic Atherosclerois ([G0]-170.0)

## 2017-10-23 MED ORDER — SODIUM CHLORIDE 0.9 % IV SOLN
1.0000 g | INTRAVENOUS | Status: DC
  Administered 2017-10-23: 1 g via INTRAVENOUS
  Filled 2017-10-23: qty 1

## 2017-10-23 MED ORDER — ACETAMINOPHEN 650 MG RE SUPP: 650.0000 mg | Freq: Four times a day (QID) | RECTAL | Status: DC | PRN

## 2017-10-23 MED ORDER — IOPAMIDOL (ISOVUE-370) INJECTION 76%
100.0000 mL | Freq: Once | INTRAVENOUS | Status: AC | PRN
  Administered 2017-10-23: 73 mL via INTRAVENOUS

## 2017-10-23 MED ORDER — INSULIN ASPART 100 UNIT/ML ~~LOC~~ SOLN
0.0000 [IU] | Freq: Three times a day (TID) | SUBCUTANEOUS | Status: DC
  Administered 2017-10-23: 5 [IU] via SUBCUTANEOUS
  Administered 2017-10-23: 8 [IU] via SUBCUTANEOUS
  Administered 2017-10-24: 3 [IU] via SUBCUTANEOUS

## 2017-10-23 MED ORDER — IPRATROPIUM BROMIDE 0.02 % IN SOLN
0.5000 mg | Freq: Once | RESPIRATORY_TRACT | Status: AC
  Administered 2017-10-23: 0.5 mg via RESPIRATORY_TRACT
  Filled 2017-10-23: qty 2.5

## 2017-10-23 MED ORDER — ACETAMINOPHEN 325 MG PO TABS
650.0000 mg | ORAL_TABLET | Freq: Four times a day (QID) | ORAL | Status: DC | PRN
  Administered 2017-10-23: 650 mg via ORAL
  Filled 2017-10-23: qty 2

## 2017-10-23 MED ORDER — IPRATROPIUM-ALBUTEROL 0.5-2.5 (3) MG/3ML IN SOLN: 3.0000 mL | Freq: Four times a day (QID) | RESPIRATORY_TRACT | Status: DC | PRN

## 2017-10-23 MED ORDER — LEVALBUTEROL HCL 0.63 MG/3ML IN NEBU
0.6300 mg | INHALATION_SOLUTION | Freq: Once | RESPIRATORY_TRACT | Status: AC
  Administered 2017-10-23: 0.63 mg via RESPIRATORY_TRACT
  Filled 2017-10-23: qty 3

## 2017-10-23 MED ORDER — DULOXETINE HCL 30 MG PO CPEP
30.0000 mg | ORAL_CAPSULE | Freq: Every day | ORAL | Status: DC
  Filled 2017-10-23: qty 1

## 2017-10-23 MED ORDER — BUDESONIDE 0.5 MG/2ML IN SUSP
0.5000 mg | Freq: Two times a day (BID) | RESPIRATORY_TRACT | Status: DC
  Administered 2017-10-23 – 2017-10-24 (×3): 0.5 mg via RESPIRATORY_TRACT
  Filled 2017-10-23 (×3): qty 2

## 2017-10-23 MED ORDER — PANTOPRAZOLE SODIUM 40 MG PO TBEC
40.0000 mg | DELAYED_RELEASE_TABLET | Freq: Every day | ORAL | Status: DC
  Filled 2017-10-23: qty 1

## 2017-10-23 MED ORDER — AZITHROMYCIN 500 MG IV SOLR
INTRAVENOUS | Status: AC
  Filled 2017-10-23: qty 500

## 2017-10-23 MED ORDER — MAGNESIUM SULFATE 2 GM/50ML IV SOLN
2.0000 g | Freq: Once | INTRAVENOUS | Status: AC
  Administered 2017-10-23: 2 g via INTRAVENOUS
  Filled 2017-10-23: qty 50

## 2017-10-23 MED ORDER — SODIUM CHLORIDE 0.9 % IV SOLN
1.0000 g | Freq: Once | INTRAVENOUS | Status: AC
  Administered 2017-10-23: 1 g via INTRAVENOUS
  Filled 2017-10-23: qty 10

## 2017-10-23 MED ORDER — LORATADINE 10 MG PO TABS
10.0000 mg | ORAL_TABLET | Freq: Every day | ORAL | Status: DC
  Filled 2017-10-23: qty 1

## 2017-10-23 MED ORDER — TAMSULOSIN HCL 0.4 MG PO CAPS
0.4000 mg | ORAL_CAPSULE | Freq: Two times a day (BID) | ORAL | Status: DC
  Filled 2017-10-23: qty 1

## 2017-10-23 MED ORDER — SODIUM CHLORIDE 0.9 % IV SOLN
INTRAVENOUS | Status: DC | PRN
  Administered 2017-10-23: 1000 mL via INTRAVENOUS
  Administered 2017-10-23: 500 mL via INTRAVENOUS

## 2017-10-23 MED ORDER — ENOXAPARIN SODIUM 40 MG/0.4ML ~~LOC~~ SOLN
40.0000 mg | Freq: Every day | SUBCUTANEOUS | Status: DC
  Filled 2017-10-23: qty 0.4

## 2017-10-23 MED ORDER — BUPROPION HCL ER (XL) 300 MG PO TB24
300.0000 mg | ORAL_TABLET | Freq: Every day | ORAL | Status: DC
  Filled 2017-10-23: qty 1

## 2017-10-23 MED ORDER — METHYLPREDNISOLONE SODIUM SUCC 40 MG IJ SOLR
40.0000 mg | Freq: Two times a day (BID) | INTRAMUSCULAR | Status: DC
  Administered 2017-10-23 (×2): 40 mg via INTRAVENOUS
  Filled 2017-10-23 (×3): qty 1

## 2017-10-23 MED ORDER — PREDNISONE 20 MG PO TABS: 60.0000 mg | ORAL_TABLET | Freq: Every day | ORAL | Status: DC

## 2017-10-23 MED ORDER — AZITHROMYCIN 250 MG PO TABS
500.0000 mg | ORAL_TABLET | ORAL | Status: DC
  Administered 2017-10-23: 500 mg via ORAL
  Filled 2017-10-23: qty 2

## 2017-10-23 MED ORDER — IBUPROFEN 200 MG PO TABS
600.0000 mg | ORAL_TABLET | Freq: Four times a day (QID) | ORAL | Status: DC | PRN
  Administered 2017-10-23: 600 mg via ORAL
  Filled 2017-10-23: qty 3

## 2017-10-23 MED ORDER — LISINOPRIL 5 MG PO TABS: 2.5000 mg | ORAL_TABLET | Freq: Every day | ORAL | Status: DC

## 2017-10-23 MED ORDER — ONDANSETRON HCL 4 MG/2ML IJ SOLN: 4.0000 mg | Freq: Four times a day (QID) | INTRAMUSCULAR | Status: DC | PRN

## 2017-10-23 MED ORDER — FLUTICASONE PROPIONATE 50 MCG/ACT NA SUSP
1.0000 | Freq: Every day | NASAL | Status: DC | PRN
Start: 2017-10-23 — End: 2017-10-24

## 2017-10-23 MED ORDER — TIOTROPIUM BROMIDE MONOHYDRATE 18 MCG IN CAPS
18.0000 ug | ORAL_CAPSULE | Freq: Every day | RESPIRATORY_TRACT | Status: DC
  Filled 2017-10-23: qty 5

## 2017-10-23 MED ORDER — ARFORMOTEROL TARTRATE 15 MCG/2ML IN NEBU
15.0000 ug | INHALATION_SOLUTION | Freq: Two times a day (BID) | RESPIRATORY_TRACT | Status: DC
  Administered 2017-10-23 – 2017-10-24 (×3): 15 ug via RESPIRATORY_TRACT
  Filled 2017-10-23 (×3): qty 2

## 2017-10-23 MED ORDER — ONDANSETRON HCL 4 MG PO TABS: 4.0000 mg | ORAL_TABLET | Freq: Four times a day (QID) | ORAL | Status: DC | PRN

## 2017-10-23 MED ORDER — IPRATROPIUM-ALBUTEROL 0.5-2.5 (3) MG/3ML IN SOLN
3.0000 mL | Freq: Four times a day (QID) | RESPIRATORY_TRACT | Status: DC
  Administered 2017-10-23 – 2017-10-24 (×4): 3 mL via RESPIRATORY_TRACT
  Filled 2017-10-23 (×4): qty 3

## 2017-10-23 MED ORDER — AZITHROMYCIN 500 MG IV SOLR
500.0000 mg | Freq: Once | INTRAVENOUS | Status: AC
  Administered 2017-10-23: 500 mg via INTRAVENOUS
  Filled 2017-10-23: qty 500

## 2017-10-23 MED ORDER — INSULIN ASPART 100 UNIT/ML ~~LOC~~ SOLN
0.0000 [IU] | Freq: Every day | SUBCUTANEOUS | Status: DC
  Administered 2017-10-23: 2 [IU] via SUBCUTANEOUS

## 2017-10-23 MED ORDER — SODIUM CHLORIDE 0.9% FLUSH
3.0000 mL | Freq: Two times a day (BID) | INTRAVENOUS | Status: DC
  Administered 2017-10-23 (×2): 3 mL via INTRAVENOUS

## 2017-10-23 MED ORDER — ATORVASTATIN CALCIUM 20 MG PO TABS: 20.0000 mg | ORAL_TABLET | ORAL | Status: DC

## 2017-10-23 MED ORDER — ASPIRIN EC 81 MG PO TBEC
81.0000 mg | DELAYED_RELEASE_TABLET | ORAL | Status: DC
  Filled 2017-10-23: qty 1

## 2017-10-23 MED ORDER — IPRATROPIUM-ALBUTEROL 0.5-2.5 (3) MG/3ML IN SOLN
3.0000 mL | Freq: Four times a day (QID) | RESPIRATORY_TRACT | Status: DC
  Administered 2017-10-23: 3 mL via RESPIRATORY_TRACT
  Filled 2017-10-23: qty 3

## 2017-10-23 NOTE — ED Notes (Signed)
RT at [facility], neb in progress. No changes. Alert, NAD, calm, interactive.

## 2017-10-23 NOTE — ED Notes (Signed)
[facility] here. Pt up to b/r, steady gait.

## 2017-10-23 NOTE — Progress Notes (Signed)
Frederick Guzman refuses to take any of his home medications that the hospital supplies. He has his medications from home and insists on taking them. I explained the policy and the reasons for the policy but he is adamant. I have notified the attending and discussed with the nurse.   [name], PharmD. Mobile: [phone]. 10/23/2017,7:16 AM.

## 2017-10-23 NOTE — Progress Notes (Signed)
PT. REFUSES CPAP AT THIS TIME.  HE STATES THAT HE CANT TOLERATE OUR MACHINES.  WILL BE AVAILABLE IF NEEDED.

## 2017-10-23 NOTE — Plan of Care (Addendum)
Transfer from [facility] discussed with April Palumbo, MD.  Frederick Guzman is a 67 year old male with pmh of COPD, HTN, HLD, DM type II, GERD, on testosterone replacement, and history of melanoma; who presented with cough and shortness of breath.  Recently treated with Z-Pak and oral steroids without improvement of symptoms.  On admission patient seen to have a temperature of 100.1 F, pulse 104-125, respirations maintained, blood pressure 91/57-120/72, and O2 saturation maintained on room air.  Labs revealed WBC 20.3.  CT angiogram of the chest revealing multifocal masslike opacities concerning for multifocal pneumonia versus inflammatory process versus less likely malignancy.  Was given 125 mg of Solu-Medrol IV, DuoNeb breathing treatments, azithromycin, and Rocephin.  TRH called to admit.  Added on lactic acid level.  Accepted patient has been patient to a telemetry bed.

## 2017-10-23 NOTE — H&P (Addendum)
History and Physical    Frederick Guzman GYB:[id number] DOB: 07/20/1950 DOA: 10/22/2017  Referring MD/NP/PA: April Palumbo, MD PCP: [name], DO  Patient coming from: [facility] transfer  Chief Complaint: Cough  I have personally briefly reviewed patient's old medical records in [place]   HPI: Frederick Guzman is a 67 y.o. male with medical history significant ofCOPD, HTN, HLD, DM type II, GERD, on testosterone replacement, tobacco abuse, and history of melanoma; who presented with 3 to 4-week history of progressively worsening productive cough with shortness of breath.  He reports having wheezing with chest tightness.  He has been using his Symbicort and albuterol inhaler without relief of symptoms.  Patient continues to smoke 1.5 packs of cigarettes per day on average and has no will of quitting.  He followed up with his pulmonologist Dr. Thornton Papas 5 days ago, and was started on azithromycin and steroid taper.  Despite taking medications because he reported having subjective fever, headache, myalgias, and progressively worsening shortness of breath.  Denies having any chest pain, weight loss, nausea, vomiting, diarrhea, or leg swelling.   ED Course: Upon admission patient seen to have a temperature of 100.1 F, pulse 104-125, respirations maintained, blood pressure 91/57-120/72, and O2 saturation maintained on room air.  Labs revealed WBC 20.3 and lactic acid following therapies was noted to be 1.18. CT angiogram of the chest revealing multifocal masslike opacities concerning for multifocal pneumonia versus inflammatory process versus less likely malignancy.  Patient was given 125 mg of Solu-Medrol IV, 2 g of magnesium sulfate, DuoNeb breathing treatments, azithromycin, and Rocephin. TRH called to admit.   Review of Systems  Constitutional: Positive for fever and malaise/fatigue. Negative for weight loss.  HENT: Negative for ear discharge and ear pain.   Eyes: Negative for  photophobia and pain.  Respiratory: Positive for cough, sputum production, shortness of breath and wheezing.   Cardiovascular: Negative for chest pain and leg swelling.  Gastrointestinal: Negative for abdominal pain, nausea and vomiting.  Genitourinary: Negative for dysuria and frequency.  Musculoskeletal: Positive for myalgias. Negative for joint pain.  Skin: Negative for itching and rash.  Neurological: Positive for weakness and headaches.  Psychiatric/Behavioral: Negative for memory loss. The patient has insomnia.     Past Medical History:  Diagnosis Date  . Arthritis    OA  . Asthma   . Cancer ([place]) 08/2014   melanoma on R shoulder & back - [facility]   . COPD (chronic obstructive pulmonary disease) ([place])   . Depression    pt. denies- 01/2016  . Diabetes mellitus without complication ([place])   . Diverticulitis   . Dyspnea    with exertion  . Fibromyalgia   . GERD (gastroesophageal reflux disease)   . Hyperlipemia   . Hypertension    "never was high"  . Pneumonia 06/2015  . Sleep apnea    last study - Nov. 2017, waiting for a new machine, using the old CPAP q night after cleaning the device after he was documented to have pneumonia from his CPAP machine.     Past Surgical History:  Procedure Laterality Date  . carpel tunnel Bilateral   . COLONOSCOPY W/ POLYPECTOMY    . COLONOSCOPY W/ POLYPECTOMY    . EXCISIONAL TOTAL SHOULDER ARTHROPLASTY WITH ANTIBIOTIC SPACER Left 08/09/2016   Procedure: LEFT SHOULDER HARDWARE REMOVAL, IRRIGATION AND DEBRIDEMENT, PLACEMENT OF ANTIBIOTIC SPACER;  Surgeon: Netta Cedars, MD;  Location: [facility];  Service: Orthopedics;  Laterality: Left;  . IRRIGATION AND DEBRIDEMENT SHOULDER  Left 03/31/2016   Procedure: IRRIGATION AND DEBRIDEMENT SHOULDER, deep cultures and polyethylene exchange;  Surgeon: Netta Cedars, MD;  Location: [facility];  Service: Orthopedics;  Laterality: Left;  requests 23mns  . REVERSE SHOULDER ARTHROPLASTY Left 01/30/2016   Procedure:  LEFT REVERSE SHOULDER ARTHROPLASTY;  Surgeon: SNetta Cedars MD;  [place] [facility]  Service: Orthopedics;  Laterality: Left;  . ROTATOR CUFF REPAIR Left   . TOTAL SHOULDER REVISION Left 01/17/2017   Procedure: LEFT SHOULDER REVISION REVERSE TOTAL SHOULDER ARTHROPLASTY;  Surgeon: NNetta Cedars MD;  [place] [facility]  Service: Orthopedics;  Laterality: Left;  . WRIST SURGERY Right 11/2014   Dr. GAmedeo Plenty    reports that he has been smoking cigarettes.  He has a 84.00 pack-year smoking history. He has never used smokeless tobacco. He reports that he drinks about 4.2 oz of alcohol per week. He reports that he does not use drugs.  Allergies  Allergen Reactions  . Methotrexate Derivatives Nausea And Vomiting and Other (See Comments)    Mood change Changed his mood and nausea Mood change   . Tramadol Nausea And Vomiting  . Methotrexate   . Ultram [Tramadol Hcl] Nausea And Vomiting    Family History  Problem Relation Age of Onset  . Diabetes Mother   . Colon cancer Father   . Prostate cancer Father   . Diabetes Father     Prior to Admission medications   Medication Sig Start Date End Date Taking? Authorizing Provider  albuterol (PROAIR HFA) 108 (90 Base) MCG/ACT inhaler Inhale 2 puffs into the lungs every 6 (six) hours as needed for wheezing or shortness of breath.    [provider]  aspirin EC 81 MG tablet Take 81 mg by mouth every other day.     [provider]  atorvastatin (LIPITOR) 20 MG tablet Take 1 tablet (20 mg total) by mouth every other day. 09/06/17   AEmeterio Reeve DO  b complex vitamins tablet Take 1 tablet by mouth daily.    [provider]  budesonide-formoterol (SYMBICORT) 160-4.5 MCG/ACT inhaler Inhale 2 puffs into the lungs 2 (two) times daily.    [provider]  buPROPion (WELLBUTRIN XL) 300 MG 24 hr tablet Take 1 tablet (300 mg total) by mouth daily. 09/06/17   AEmeterio Reeve DO  cetirizine (ZYRTEC) 10 MG tablet Take 10 mg  by mouth daily as needed for allergies (seasonal allergies).     [provider]  DULoxetine (CYMBALTA) 30 MG capsule Take 1 capsule (30 mg total) by mouth daily. 09/06/17   AEmeterio Reeve DO  esomeprazole (NEXIUM) 40 MG capsule Take 1 capsule (40 mg total) by mouth daily. 09/06/17   AEmeterio Reeve DO  fluticasone ([facility] 50 MCG/ACT nasal spray Place 1 spray into both nostrils daily as needed for allergies. 12/30/15   [provider]  glimepiride (AMARYL) 2 MG tablet Take 1 tablet (2 mg total) by mouth daily. 09/06/17   AEmeterio Reeve DO  glucose blood test strip Use up to 4 times per day as directed with glucometer. Disp: 100. Refill x99 Patient taking differently: as directed with glucometer. Disp: 100. Refill x99 01/06/17   AEmeterio Reeve DO  ibuprofen (ADVIL,MOTRIN) 200 MG tablet Take 600-800 mg by mouth every 6 (six) hours as needed for moderate pain (depends on pain if takes 3-4 tablets).    [provider]  ipratropium-albuterol (DUONEB) 0.5-2.5 (3) MG/3ML SOLN Take 3 mLs by nebulization every 6 (six) hours as needed. Patient taking differently: Take 3  mLs by nebulization every 6 (six) hours as needed (for shortness of breath).  07/13/15   Caren Griffins, MD  Lancets (FREESTYLE) lancets Use as instructed up to tid 09/21/16   [name], DO  lisinopril (PRINIVIL,ZESTRIL) 2.5 MG tablet Take 1 tablet (2.5 mg total) by mouth at bedtime. 09/06/17   [name], DO  Multiple Vitamin (MULTIVITAMIN WITH MINERALS) TABS tablet Take 1 tablet by mouth at bedtime.    [provider]  Omega-3 Fatty Acids (FISH OIL) 1000 MG CAPS Take 1,000 mg by mouth at bedtime.     [provider]  sildenafil (REVATIO) 20 MG tablet Take 0.5-4 tablets (10-80 mg total) by mouth daily as needed. Prior to sex 03/24/17   [name], DO  tadalafil (CIALIS) 5 MG tablet Take 1 tablet (5 mg total) by mouth daily. Use daily. 10/17/17   [name], DO  tamsulosin (FLOMAX) 0.4 MG CAPS capsule Take 1 capsule (0.4 mg total) by mouth 2 (two) times daily. 09/06/17   [name], DO  Testosterone 30 MG/ACT SOLN Apply 1-2 Pump topically daily. Alternates taking, 1 pump under one arm one day, 2 pumps the next day (1 pump under each arm). 09/07/17   [name], DO  vitamin C (ASCORBIC ACID) 500 MG tablet Take 500 mg by mouth daily.    [provider]    Physical Exam:  Constitutional: Elderly male who appears in no acute distress at this time Vitals:   10/23/17 0300 10/23/17 0330 10/23/17 0351 10/23/17 0436  BP: 107/67 99/68  121/76  Pulse: 90 90 92 85  Resp: _0 Temp: 98.4 F (36.9 C)   98.1 F (36.7 C)  TempSrc: Oral   Oral  SpO2: 94% 92% 97% 97%  Weight:      Height:    6' (1.829 m)   Eyes: PERRL, lids and conjunctivae normal ENMT: Mucous membranes are moist. Posterior pharynx clear of any exudate or lesions.  Neck: normal, supple, no masses, no thyromegaly Respiratory: Decreased aeration with significant expiratory wheeze appreciated. Cardiovascular: Regular rate and rhythm, no murmurs / rubs / gallops. No extremity edema. 2+ pedal pulses. No carotid bruits.  Abdomen: no tenderness, no masses palpated. No hepatosplenomegaly. Bowel sounds positive.  Musculoskeletal: no clubbing / cyanosis. No joint deformity upper and lower extremities. Good ROM, no contractures. Normal muscle tone.  Skin: no rashes, lesions, ulcers. No induration Neurologic: CN 2-12 grossly intact. Sensation intact, DTR normal. Strength 5/5 in all 4.  Psychiatric: Normal judgment and insight. Alert and oriented x 3. Normal mood.     Labs on Admission: I have personally reviewed following labs and imaging studies  CBC: Recent Labs  Lab 10/22/17 2325  WBC 20.3*  NEUTROABS 17.2*  HGB 15.1  HCT 43.0  MCV 91.1  PLT 254   Basic Metabolic Panel: Recent Labs  Lab 10/22/17 2325  NA 138  K 3.5  CL 102  CO2 26    GLUCOSE 131*  BUN 20  CREATININE 1.08  CALCIUM 8.3*  MG 1.8   GFR: Estimated Creatinine Clearance: 71.9 mL/min (by C-G formula based on SCr of 1.08 mg/dL). Liver Function Tests: No results for input(s): AST, ALT, ALKPHOS, BILITOT, PROT, ALBUMIN in the last 168 hours. No results for input(s): LIPASE, AMYLASE in the last 168 hours. No results for input(s): AMMONIA in the last 168 hours. Coagulation Profile: No results for input(s): INR, PROTIME in the last 168 hours. Cardiac Enzymes: Recent Labs  Lab 10/22/17  2326  TROPONINI <0.03   BNP (last 3 results) No results for input(s): PROBNP in the last 8760 hours. HbA1C: No results for input(s): HGBA1C in the last 72 hours. CBG: Recent Labs  Lab 10/23/17 0305  GLUCAP 259*   Lipid Profile: No results for input(s): CHOL, HDL, LDLCALC, TRIG, CHOLHDL, LDLDIRECT in the last 72 hours. Thyroid Function Tests: No results for input(s): TSH, T4TOTAL, FREET4, T3FREE, THYROIDAB in the last 72 hours. Anemia Panel: No results for input(s): VITAMINB12, FOLATE, FERRITIN, TIBC, IRON, RETICCTPCT in the last 72 hours. Urine analysis: No results found for: COLORURINE, APPEARANCEUR, LABSPEC, PHURINE, GLUCOSEU, HGBUR, BILIRUBINUR, KETONESUR, PROTEINUR, UROBILINOGEN, NITRITE, LEUKOCYTESUR Sepsis Labs: No results found for this or any previous visit (from the past 240 hour(s)).   Radiological Exams on Admission: Dg Chest 2 View  Result Date: 10/23/2017 CLINICAL DATA:  Shortness of breath.  History of COPD. EXAM: CHEST - 2 VIEW COMPARISON:  Chest CT 07/13/2015 FINDINGS: Lungs are hyperinflated with bronchial thickening. There is rounded right perihilar soft tissue prominence. No pleural fluid or pneumothorax. No acute osseous abnormalities. Reverse left shoulder arthroplasty. IMPRESSION: 1. Rounded right perihilar soft tissue prominence is nonspecific and may represent hilar adenopathy, enlarged pulmonary artery, or central lung mass. Recommend chest CT  with contrast for characterization. 2. Hyperinflation and bronchial thickening, imaging findings suggesting COPD. Electronically Signed   By: Jeb Levering M.D.   On: 10/23/2017 00:02   Ct Angio Chest Pe W And/or Wo Contrast  Result Date: 10/23/2017 CLINICAL DATA:  Shortness of breath. EXAM: CT ANGIOGRAPHY CHEST WITH CONTRAST TECHNIQUE: Multidetector CT imaging of the chest was performed using the standard protocol during bolus administration of intravenous contrast. Multiplanar CT image reconstructions and MIPs were obtained to evaluate the vascular anatomy. CONTRAST:  48m ISOVUE-370 IOPAMIDOL (ISOVUE-370) INJECTION 76% COMPARISON:  None. FINDINGS: Cardiovascular: The heart size is normal. No substantial pericardial effusion. Coronary artery calcification is evident. Atherosclerotic calcification is noted in the wall of the thoracic aorta. No filling defect within the opacified pulmonary arteries to suggest the presence of an acute pulmonary embolus. Mediastinum/Nodes: 14 mm short axis precarinal lymph node is associated with a 10 mm short axis prevascular node. Scattered small mediastinal and bilateral hilar lymph nodes are evident. The esophagus has normal imaging features. There is no axillary lymphadenopathy. Lungs/Pleura: Areas of subpleural reticulation noted bilaterally. Focal masslike airspace disease is identified in the right upper lobe in both lower lobes. There is prominent airway impaction in the right lower lobe. 11 mm peripheral nodule is seen in the right upper lobe on series 5: Image 24. 7 mm right lower lobe nodule (5:78) no pleural effusion. 5 mm left upper lobe nodule (5:36). Upper Abdomen: Unremarkable. Musculoskeletal: Left shoulder replacement. No worrisome lytic or sclerotic osseous abnormality. Review of the MIP images confirms the above findings. IMPRESSION: 1. Multifocal masslike areas of airspace opacity. Imaging features may reflect multifocal pneumonia or inflammatory process.  Neoplasm not excluded but considered less likely. Continued close attention will be required. 2. Bilateral pulmonary nodules measuring up to 7 mm. Attention on follow-up recommended. 3. Prominent airway impaction right lower lobe. This may be infectious/inflammatory although aspiration could have this appearance. 4. Areas of subpleural reticulation bilaterally suggest underlying fibrotic lung disease. 5.  Aortic Atherosclerois (ICD10-170.0) Electronically Signed   By: EMisty StanleyM.D.   On: 10/23/2017 01:02    EKG: Independently reviewed.  Sinus tachycardia 126 bpm  Assessment/Plan COPD exacerbation: Acute on chronic.  Patient presents with worsening cough, wheezing, and  progressively worsening shortness of breath.  Able to maintain O2 saturations on room air - Admit to a telemetry bed - Nasal cannula oxygen as needed - DuoNeb's 4 times daily and as needed shortness of breath/wheezing - Brovana and budesonide nebs - Prednisone 60 mg  Suspect CAP (community acquired pneumonia): CT scan of the chest showed multifocal areas concerning for multifocal pneumonia or inflammatory process. - Check sputum culture and respiratory virus panel - Check ESR  - Continue empiric antibiotics of Rocephin and azithromycin  Diabetes mellitus type 2: Patient reports last hemoglobin A1c noted to be  6.4 on 7/29. - Hypoglycemic protocol - Hold Amaryl - CBGs q. before meals and at bedtime with moderate SSI  Pulmonary nodules: CT scan revealed pulmonary nodules measuring up to 7 mm. - Continue outpatient follow-up with pulmonologist  Essential hypertension - Continue lisinopril  OSA - Continue BiPAP at night  Tobacco abuse: Patient still reports smoking 1.5 cigarettes/day on average and has no will of quitting. - Counseled on the need of cessation of tobacco use.   DVT prophylaxis: Lovenox Code Status: Full Family Communication: Discussed Disposition Plan: Discharge home once medically stable Consults  called: none  Admission status: Inpatient  Norval Morton MD [facility] Pager [phone]   If 7PM-7AM, please contact night-coverage [email or web address] Password [facility]  10/23/2017, 5:37 AM

## 2017-10-23 NOTE — ED Notes (Signed)
Ambulatory to b/r, steady gait, family leaving BS at this time. Pt reports taking his home meds: flomax, fish oil and MVI. Breathing easier, "feel better". Sitting in chair for comfort.

## 2017-10-23 NOTE — ED Notes (Signed)
[facility] notified [facility]) - hospitalist consult @ [facility]

## 2017-10-23 NOTE — ED Notes (Signed)
"  feel better", alert, NAD, calm, breathing easier, VSS, to CT.

## 2017-10-23 NOTE — Progress Notes (Signed)
[facility] PROGRESS NOTE    Progress Note  SUSIE POUSSON  ZOX:[id number] DOB: 1950-06-09 DOA: 10/22/2017 PCP: [name], DO     Brief Narrative:   Frederick Guzman is an 67 y.o. male past medical history significant for COPD, hypertension diabetes mellitus type 2 on testosterone replacement history of melanoma who presents with 3 weeks of progressive shortness of breath with productive cough, wheezing and chest tightness in the ED was found to be with a temp of 100.1 mild leukocytosis CT Angie of the chest show multifocal infiltrates.  Assessment/Plan:   Sepsis due to Bacterial pneumonia and possibly COPD exacerbation: CT scan of the chest show multifocal infiltrates, with a mild temp and leukocytosis concern for pneumonia. He was started empirically on IV Rocephin and azithromycin ESR is pending. He was started on inhalers and IV steroids.  Diabetes mellitus type 2: A1c of 6.4 continue to hold oral hypoglycemic agents. Continue sliding scale insulin.  Pulmonary nodule seen on CT: No measuring about 7 mm will need to follow-up imaging as an outpatient once infectious etiology is resolved.  Essential hypertension: Blood pressure stable continue lisinopril.   DVT prophylaxis: lovenxo Family Communication:none Disposition Plan/Barrier to D/C: home in 2 days Code Status:     Code Status Orders  (From admission, onward)        Start     Ordered   10/23/17 0548  Full code  Continuous     10/23/17 0552    Code Status History    Date Active Date Inactive Code Status Order ID Comments User Context   01/17/2017 1924 01/18/2017 1359 Full Code [id number]  Netta Cedars, MD Inpatient   08/09/2016 2037 08/12/2016 1637 Full Code [id number]  Netta Cedars, MD Inpatient   03/31/2016 2242 04/01/2016 2145 Full Code [id number]  Netta Cedars, MD Inpatient   01/30/2016 1139 01/31/2016 1418 Full Code [id number]  Netta Cedars, MD Inpatient   07/13/2015 [phone] 07/13/2015 2016  Full Code [id number]  Lavina Hamman, MD Inpatient        IV Access:    Peripheral IV   Procedures and diagnostic studies:   Dg Chest 2 View  Result Date: 10/23/2017 CLINICAL DATA:  Shortness of breath.  History of COPD. EXAM: CHEST - 2 VIEW COMPARISON:  Chest CT 07/13/2015 FINDINGS: Lungs are hyperinflated with bronchial thickening. There is rounded right perihilar soft tissue prominence. No pleural fluid or pneumothorax. No acute osseous abnormalities. Reverse left shoulder arthroplasty. IMPRESSION: 1. Rounded right perihilar soft tissue prominence is nonspecific and may represent hilar adenopathy, enlarged pulmonary artery, or central lung mass. Recommend chest CT with contrast for characterization. 2. Hyperinflation and bronchial thickening, imaging findings suggesting COPD. Electronically Signed   By: Jeb Levering M.D.   On: 10/23/2017 00:02   Ct Angio Chest Pe W And/or Wo Contrast  Result Date: 10/23/2017 CLINICAL DATA:  Shortness of breath. EXAM: CT ANGIOGRAPHY CHEST WITH CONTRAST TECHNIQUE: Multidetector CT imaging of the chest was performed using the standard protocol during bolus administration of intravenous contrast. Multiplanar CT image reconstructions and MIPs were obtained to evaluate the vascular anatomy. CONTRAST:  52m ISOVUE-370 IOPAMIDOL (ISOVUE-370) INJECTION 76% COMPARISON:  None. FINDINGS: Cardiovascular: The heart size is normal. No substantial pericardial effusion. Coronary artery calcification is evident. Atherosclerotic calcification is noted in the wall of the thoracic aorta. No filling defect within the opacified pulmonary arteries to suggest the presence of an acute pulmonary embolus. Mediastinum/Nodes: 14 mm short axis precarinal lymph node is associated with a 10 mm  short axis prevascular node. Scattered small mediastinal and bilateral hilar lymph nodes are evident. The esophagus has normal imaging features. There is no axillary lymphadenopathy. Lungs/Pleura:  Areas of subpleural reticulation noted bilaterally. Focal masslike airspace disease is identified in the right upper lobe in both lower lobes. There is prominent airway impaction in the right lower lobe. 11 mm peripheral nodule is seen in the right upper lobe on series 5: Image 24. 7 mm right lower lobe nodule (5:78) no pleural effusion. 5 mm left upper lobe nodule (5:36). Upper Abdomen: Unremarkable. Musculoskeletal: Left shoulder replacement. No worrisome lytic or sclerotic osseous abnormality. Review of the MIP images confirms the above findings. IMPRESSION: 1. Multifocal masslike areas of airspace opacity. Imaging features may reflect multifocal pneumonia or inflammatory process. Neoplasm not excluded but considered less likely. Continued close attention will be required. 2. Bilateral pulmonary nodules measuring up to 7 mm. Attention on follow-up recommended. 3. Prominent airway impaction right lower lobe. This may be infectious/inflammatory although aspiration could have this appearance. 4. Areas of subpleural reticulation bilaterally suggest underlying fibrotic lung disease. 5.  Aortic Atherosclerois (ICD10-170.0) Electronically Signed   By: Misty Stanley M.D.   On: 10/23/2017 01:02     Medical Consultants:    None.  Anti-Infectives:   IV Rocephin and azithromycin.  Subjective:    [name] he relates his breathing is better.  Objective:    Vitals:   10/23/17 0300 10/23/17 0330 10/23/17 0351 10/23/17 0436  BP: 107/67 99/68  121/76  Pulse: 90 90 92 85  Resp: '18 15 17 18  ' Temp: 98.4 F (36.9 C)   98.1 F (36.7 C)  TempSrc: Oral   Oral  SpO2: 94% 92% 97% 97%  Weight:      Height:    6' (1.829 m)    Intake/Output Summary (Last 24 hours) at 10/23/2017 0733 Last data filed at 10/23/2017 0347 Gross per 24 hour  Intake 850 ml  Output -  Net 850 ml   Filed Weights   10/22/17 2227  Weight: 90.3 kg (199 lb)    Exam: General exam: In no acute distress. Respiratory  system: Good air movement and clear to auscultation. Cardiovascular system: S1 & S2 heard, RRR. Gastrointestinal system: Abdomen is nondistended, soft and nontender.  Central nervous system: Alert and oriented. No focal neurological deficits. Extremities: No pedal edema. Skin: No rashes, lesions or ulcers Psychiatry: Judgement and insight appear normal. Mood & affect appropriate.    Data Reviewed:    Labs: Basic Metabolic Panel: Recent Labs  Lab 10/22/17 2325  NA 138  K 3.5  CL 102  CO2 26  GLUCOSE 131*  BUN 20  CREATININE 1.08  CALCIUM 8.3*  MG 1.8   GFR Estimated Creatinine Clearance: 71.9 mL/min (by C-G formula based on SCr of 1.08 mg/dL). Liver Function Tests: No results for input(s): AST, ALT, ALKPHOS, BILITOT, PROT, ALBUMIN in the last 168 hours. No results for input(s): LIPASE, AMYLASE in the last 168 hours. No results for input(s): AMMONIA in the last 168 hours. Coagulation profile No results for input(s): INR, PROTIME in the last 168 hours.  CBC: Recent Labs  Lab 10/22/17 2325  WBC 20.3*  NEUTROABS 17.2*  HGB 15.1  HCT 43.0  MCV 91.1  PLT 179   Cardiac Enzymes: Recent Labs  Lab 10/22/17 2326  TROPONINI <0.03   BNP (last 3 results) No results for input(s): PROBNP in the last 8760 hours. CBG: Recent Labs  Lab 10/23/17 0305  GLUCAP 259*  D-Dimer: No results for input(s): DDIMER in the last 72 hours. Hgb A1c: No results for input(s): HGBA1C in the last 72 hours. Lipid Profile: No results for input(s): CHOL, HDL, LDLCALC, TRIG, CHOLHDL, LDLDIRECT in the last 72 hours. Thyroid function studies: No results for input(s): TSH, T4TOTAL, T3FREE, THYROIDAB in the last 72 hours.  Invalid input(s): FREET3 Anemia work up: No results for input(s): VITAMINB12, FOLATE, FERRITIN, TIBC, IRON, RETICCTPCT in the last 72 hours. Sepsis Labs: Recent Labs  Lab 10/22/17 2325 10/23/17 0258  WBC 20.3*  --   LATICACIDVEN  --  1.18   Microbiology No  results found for this or any previous visit (from the past 240 hour(s)).   Medications:   . arformoterol  15 mcg Nebulization BID  . aspirin EC  81 mg Oral QODAY  . atorvastatin  20 mg Oral QODAY  . azithromycin      . azithromycin  500 mg Oral Q24H  . budesonide (PULMICORT) nebulizer solution  0.5 mg Nebulization BID  . buPROPion  300 mg Oral Daily  . DULoxetine  30 mg Oral Daily  . enoxaparin (LOVENOX) injection  40 mg Subcutaneous Daily  . insulin aspart  0-15 Units Subcutaneous TID WC  . insulin aspart  0-5 Units Subcutaneous QHS  . ipratropium-albuterol  3 mL Nebulization QID  . lisinopril  2.5 mg Oral QHS  . loratadine  10 mg Oral Daily  . pantoprazole  40 mg Oral Daily  . predniSONE  60 mg Oral Q breakfast  . sodium chloride flush  3 mL Intravenous Q12H  . tamsulosin  0.4 mg Oral BID   Continuous Infusions: . sodium chloride 500 mL (10/23/17 0018)  . cefTRIAXone (ROCEPHIN)  IV       LOS: 0 days   Charlynne Cousins  [facility] Pager [phone]  *Please refer to [email or web address], password [email or web address] to get updated schedule on who will round on this patient, as hospitalists switch teams weekly. If 7PM-7AM, please contact night-coverage at [email or web address], password [email or web address] for any overnight needs.  10/23/2017, 7:33 AM

## 2017-10-24 ENCOUNTER — Encounter (HOSPITAL_COMMUNITY): Payer: Self-pay

## 2017-10-24 DIAGNOSIS — I10 Essential (primary) hypertension: Secondary | ICD-10-CM

## 2017-10-24 LAB — GLUCOSE, CAPILLARY: GLUCOSE-CAPILLARY: 194 mg/dL — AB (ref 70–99)

## 2017-10-24 MED ORDER — PREDNISONE 10 MG PO TABS: ORAL_TABLET | ORAL | 0 refills | Status: DC

## 2017-10-24 MED ORDER — TIOTROPIUM BROMIDE MONOHYDRATE 18 MCG IN CAPS: 18.0000 ug | ORAL_CAPSULE | Freq: Every day | RESPIRATORY_TRACT | 2 refills | Status: DC

## 2017-10-24 MED ORDER — AMOXICILLIN-POT CLAVULANATE 875-125 MG PO TABS: 1.0000 | ORAL_TABLET | Freq: Two times a day (BID) | ORAL | 0 refills | Status: AC

## 2017-10-24 MED ORDER — AZITHROMYCIN 250 MG PO TABS
ORAL_TABLET | ORAL | 0 refills | Status: DC
Start: 2017-10-24 — End: 2018-01-30

## 2017-10-24 NOTE — Progress Notes (Signed)
Per [facility] nurse-met inpatient-discontinued potential code 44.

## 2017-10-24 NOTE — Discharge Summary (Addendum)
Physician Discharge Summary  EDWARD GUTHMILLER WVP:[id number] DOB: [birth year] DOA: 10/22/2017  PCP: [name], DO  Admit date: 10/22/2017 Discharge date: 10/30/2017  Admitted From: home Disposition:  Home  Recommendations for Outpatient Follow-up:  1. Follow up with PCP in 1-2 weeks 2. Please obtain BMP/CBC in one week 3. Repeated CT of the chest in 6 weeks and outpatient, as he has several millimeter nodules.   Home Health:No Equipment/Devices:none  Discharge Condition:stable CODE STATUS:full Diet recommendation: Heart Healthy  Brief/Interim Summary: 67 y.o. male past medical history significant for COPD, hypertension diabetes mellitus type 2 on testosterone replacement history of melanoma who presents with 3 weeks of progressive shortness of breath with productive cough, wheezing and chest tightness in the ED was found to be with a temp of 100.1 mild leukocytosis CT Angie of the chest show multifocal infiltrates.  Discharge Diagnoses:  Principal Problem:   Bacterial pneumonia Active Problems:   TOBACCO ABUSE   OBSTRUCTIVE SLEEP APNEA   Type 2 diabetes mellitus without complications (HCC)   Benign essential HTN   Pulmonary nodules   Sepsis, unspecified organism ([place])  Sepsis due to bacterial pneumonia plus COPD exacerbation: CT scan showed multifocal infiltrates, along with mild temperature and leukocytosis. He was started empirically on IV Rocephin and azithromycin, he defervesced leukocytosis improved. He was changed to oral Augmentin and azithromycin which she will continue for 6 additional days an outpatient. He was also started on admission IV steroids which will change to oral steroids which she will continue his taper at home. He need a repeat CT scan of the chest in 6 weeks to rule out malignancy. Daughter on Spiriva who she will continue as an outpatient. He has been advised to quit smoking.  Diabetes mellitus type 2: No changes were made to his  medication.  Pulmonary nodule seen on CT: We will need to follow-up and repeat in 6 weeks to rule out malignancy.  Essential hypertension: No changes were made.    Discharge Instructions  Discharge Instructions    Diet - low sodium heart healthy   Complete by:  As directed    Increase activity slowly   Complete by:  As directed      Allergies as of 10/24/2017      Reactions   Methotrexate Derivatives Nausea And Vomiting, Other (See Comments)   Mood change Changed his mood and nausea   Ultram [tramadol Hcl] Nausea And Vomiting      Medication List    STOP taking these medications   freestyle lancets     TAKE these medications   amoxicillin-clavulanate 875-125 MG tablet Commonly known as:  AUGMENTIN Take 1 tablet by mouth every 12 (twelve) hours for 6 days.   aspirin EC 81 MG tablet Take 81 mg by mouth See admin instructions. Takes 1 tablet every other day at bedtime.   atorvastatin 20 MG tablet Commonly known as:  LIPITOR Take 1 tablet (20 mg total) by mouth every other day. What changed:    when to take this  additional instructions   azithromycin 250 MG tablet Commonly known as:  ZITHROMAX Take on tab daily   b complex vitamins tablet Take 1 tablet by mouth daily.   budesonide-formoterol 160-4.5 MCG/ACT inhaler Commonly known as:  SYMBICORT Inhale 2 puffs into the lungs 2 (two) times daily.   buPROPion 300 MG 24 hr tablet Commonly known as:  WELLBUTRIN XL Take 1 tablet (300 mg total) by mouth daily.   cetirizine 10 MG tablet Commonly known as:  ZYRTEC Take 10 mg by mouth daily as needed for allergies (seasonal allergies).   DULoxetine 30 MG capsule Commonly known as:  CYMBALTA Take 1 capsule (30 mg total) by mouth daily.   esomeprazole 40 MG capsule Commonly known as:  NEXIUM Take 1 capsule (40 mg total) by mouth daily.   Fish Oil 1000 MG Caps Take 1,000 mg by mouth at bedtime.   fluticasone 50 MCG/ACT nasal spray Commonly known as:   FLONASE Place 1 spray into both nostrils daily as needed for allergies.   glimepiride 2 MG tablet Commonly known as:  AMARYL Take 1 tablet (2 mg total) by mouth daily.   glucose blood test strip Use up to 4 times per day as directed with glucometer. Disp: 100. Refill x99 What changed:  additional instructions   ipratropium-albuterol 0.5-2.5 (3) MG/3ML Soln Commonly known as:  DUONEB Take 3 mLs by nebulization every 6 (six) hours as needed. What changed:  reasons to take this   lisinopril 2.5 MG tablet Commonly known as:  PRINIVIL,ZESTRIL Take 1 tablet (2.5 mg total) by mouth at bedtime.   multivitamin with minerals Tabs tablet Take 1 tablet by mouth at bedtime.   predniSONE 10 MG tablet Commonly known as:  DELTASONE Takes 6 tablets for 1 days, then 5 tablets for 1 days, then 4 tablets for 1 days, then 3 tablets for 1 days, then 2 tabs for 1 days, then 1 tab for 1 days, and then stop. What changed:    how much to take  how to take this  when to take this  additional instructions   PROAIR HFA 108 (90 Base) MCG/ACT inhaler Generic drug:  albuterol Inhale 2 puffs into the lungs every 6 (six) hours as needed for wheezing or shortness of breath.   sildenafil 20 MG tablet Commonly known as:  REVATIO Take 0.5-4 tablets (10-80 mg total) by mouth daily as needed. Prior to sex   tadalafil 5 MG tablet Commonly known as:  CIALIS Take 1 tablet (5 mg total) by mouth daily. Use daily.   tamsulosin 0.4 MG Caps capsule Commonly known as:  FLOMAX Take 1 capsule (0.4 mg total) by mouth 2 (two) times daily.   Testosterone 30 MG/ACT Soln Apply 1-2 Pump topically daily. Alternates taking, 1 pump under one arm one day, 2 pumps the next day (1 pump under each arm). What changed:    how much to take  additional instructions   tiotropium 18 MCG inhalation capsule Commonly known as:  SPIRIVA Place 1 capsule (18 mcg total) into inhaler and inhale daily.   vitamin C 500 MG  tablet Commonly known as:  ASCORBIC ACID Take 500 mg by mouth daily.       Allergies  Allergen Reactions  . Methotrexate Derivatives Nausea And Vomiting and Other (See Comments)    Mood change Changed his mood and nausea   . Ultram [Tramadol Hcl] Nausea And Vomiting    Consultations:  None   Procedures/Studies: Dg Chest 2 View  Result Date: 10/23/2017 CLINICAL DATA:  Shortness of breath.  History of COPD. EXAM: CHEST - 2 VIEW COMPARISON:  Chest CT 07/13/2015 FINDINGS: Lungs are hyperinflated with bronchial thickening. There is rounded right perihilar soft tissue prominence. No pleural fluid or pneumothorax. No acute osseous abnormalities. Reverse left shoulder arthroplasty. IMPRESSION: 1. Rounded right perihilar soft tissue prominence is nonspecific and may represent hilar adenopathy, enlarged pulmonary artery, or central lung mass. Recommend chest CT with contrast for characterization. 2. Hyperinflation and bronchial thickening,  imaging findings suggesting COPD. Electronically Signed   By: Jeb Levering M.D.   On: 10/23/2017 00:02   Ct Angio Chest Pe W And/or Wo Contrast  Result Date: 10/23/2017 CLINICAL DATA:  Shortness of breath. EXAM: CT ANGIOGRAPHY CHEST WITH CONTRAST TECHNIQUE: Multidetector CT imaging of the chest was performed using the standard protocol during bolus administration of intravenous contrast. Multiplanar CT image reconstructions and MIPs were obtained to evaluate the vascular anatomy. CONTRAST:  30mL ISOVUE-370 IOPAMIDOL (ISOVUE-370) INJECTION 76% COMPARISON:  None. FINDINGS: Cardiovascular: The heart size is normal. No substantial pericardial effusion. Coronary artery calcification is evident. Atherosclerotic calcification is noted in the wall of the thoracic aorta. No filling defect within the opacified pulmonary arteries to suggest the presence of an acute pulmonary embolus. Mediastinum/Nodes: 14 mm short axis precarinal lymph node is associated with a 10 mm  short axis prevascular node. Scattered small mediastinal and bilateral hilar lymph nodes are evident. The esophagus has normal imaging features. There is no axillary lymphadenopathy. Lungs/Pleura: Areas of subpleural reticulation noted bilaterally. Focal masslike airspace disease is identified in the right upper lobe in both lower lobes. There is prominent airway impaction in the right lower lobe. 11 mm peripheral nodule is seen in the right upper lobe on series 5: Image 24. 7 mm right lower lobe nodule (5:78) no pleural effusion. 5 mm left upper lobe nodule (5:36). Upper Abdomen: Unremarkable. Musculoskeletal: Left shoulder replacement. No worrisome lytic or sclerotic osseous abnormality. Review of the MIP images confirms the above findings. IMPRESSION: 1. Multifocal masslike areas of airspace opacity. Imaging features may reflect multifocal pneumonia or inflammatory process. Neoplasm not excluded but considered less likely. Continued close attention will be required. 2. Bilateral pulmonary nodules measuring up to 7 mm. Attention on follow-up recommended. 3. Prominent airway impaction right lower lobe. This may be infectious/inflammatory although aspiration could have this appearance. 4. Areas of subpleural reticulation bilaterally suggest underlying fibrotic lung disease. 5.  Aortic Atherosclerois (ICD10-170.0) Electronically Signed   By: Misty Stanley M.D.   On: 10/23/2017 01:02     Subjective: No complains Discharge Exam: Vitals:   10/24/17 0602 10/24/17 0732  BP: 120/71   Pulse: 86   Resp: 18   Temp: 97.7 F (36.5 C)   SpO2: 92% 93%   Vitals:   10/23/17 2135 10/24/17 0137 10/24/17 0602 10/24/17 0732  BP: 120/60  120/71   Pulse: 93  86   Resp: 18  18   Temp: 97.8 F (36.6 C)  97.7 F (36.5 C)   TempSrc: Oral  Oral   SpO2: 92% 96% 92% 93%  Weight:      Height:        General: Pt is alert, awake, not in acute distress Cardiovascular: RRR, S1/S2 +, no rubs, no gallops Respiratory:  CTA bilaterally, no wheezing, no rhonchi Abdominal: Soft, NT, ND, bowel sounds + Extremities: no edema, no cyanosis    The results of significant diagnostics from this hospitalization (including imaging, microbiology, ancillary and laboratory) are listed below for reference.     Microbiology: Recent Results (from the past 240 hour(s))  Culture, blood (routine x 2)     Status: None   Collection Time: 10/23/17  2:55 AM  Result Value Ref Range Status   Specimen Description   Final    BLOOD LEFT ANTECUBITAL Performed at [facility], [place] [place], [place] [address]    Special Requests   Final    BOTTLES DRAWN AEROBIC AND ANAEROBIC Blood Culture  adequate volume Performed at [facility], [address] [place], [place] [address]    Culture   Final    NO GROWTH 5 DAYS Performed at [facility], [place] [address] [place], [place] [address]    Report Status 10/28/2017 FINAL  Final  Culture, blood (routine x 2)     Status: None   Collection Time: 10/23/17  3:00 AM  Result Value Ref Range Status   Specimen Description   Final    BLOOD RIGHT HAND Performed at [facility], [address] [place], [place] [address]    Special Requests   Final    BOTTLES DRAWN AEROBIC AND ANAEROBIC Blood Culture adequate volume Performed at [facility], [address] [address] [place], [place] [address]    Culture   Final    NO GROWTH 5 DAYS Performed at [facility], [place] [address], [place], [place] [address]    Report Status 10/28/2017 FINAL  Final  Culture, blood (routine x 2) Call MD if unable to obtain prior to antibiotics being given     Status: None   Collection Time: 10/23/17  7:22 AM  Result Value Ref Range Status   Specimen Description   Final    BLOOD RIGHT ARM Performed at [facility] [address], [place], [place] [address]    Special Requests   Final    BOTTLES DRAWN AEROBIC ONLY Blood Culture  results may not be optimal due to an inadequate volume of blood received in culture bottles Performed at [facility] [address] [place], [place] [address]    Culture   Final    NO GROWTH 5 DAYS Performed at [facility], [place] [address] [place], [place] [address]    Report Status 10/28/2017 FINAL  Final  Culture, blood (routine x 2) Call MD if unable to obtain prior to antibiotics being given     Status: None   Collection Time: 10/23/17  7:22 AM  Result Value Ref Range Status   Specimen Description   Final    BLOOD RIGHT HAND Performed at [facility] [address], [place], [place] [address]    Special Requests   Final    BOTTLES DRAWN AEROBIC ONLY Blood Culture results may not be optimal due to an inadequate volume of blood received in culture bottles Performed at [place] [address] [place], [place] [address]    Culture   Final    NO GROWTH 5 DAYS Performed at [facility], [place] [address], [place], [place] [address]    Report Status 10/28/2017 FINAL  Final  Culture, sputum-assessment     Status: None   Collection Time: 10/23/17 10:50 AM  Result Value Ref Range Status   Specimen Description SPUTUM  Final   Special Requests NONE  Final   Sputum evaluation   Final    THIS SPECIMEN IS ACCEPTABLE FOR SPUTUM CULTURE Performed at [facility], [place] [address], [place],  [address]    Report Status 10/23/2017 FINAL  Final  Respiratory Panel by PCR     Status: None   Collection Time: 10/23/17 10:50 AM  Result Value Ref Range Status   Adenovirus NOT DETECTED NOT DETECTED Final   Coronavirus 229E NOT DETECTED NOT DETECTED Final   Coronavirus HKU1 NOT DETECTED NOT DETECTED Final   Coronavirus NL63 NOT DETECTED NOT DETECTED Final  Coronavirus OC43 NOT DETECTED NOT DETECTED Final   Metapneumovirus NOT DETECTED NOT DETECTED Final   Rhinovirus / Enterovirus NOT DETECTED NOT  DETECTED Final   Influenza A NOT DETECTED NOT DETECTED Final   Influenza B NOT DETECTED NOT DETECTED Final   Parainfluenza Virus 1 NOT DETECTED NOT DETECTED Final   Parainfluenza Virus 2 NOT DETECTED NOT DETECTED Final   Parainfluenza Virus 3 NOT DETECTED NOT DETECTED Final   Parainfluenza Virus 4 NOT DETECTED NOT DETECTED Final   Respiratory Syncytial Virus NOT DETECTED NOT DETECTED Final   Bordetella pertussis NOT DETECTED NOT DETECTED Final   Chlamydophila pneumoniae NOT DETECTED NOT DETECTED Final   Mycoplasma pneumoniae NOT DETECTED NOT DETECTED Final    Comment: Performed at [facility], [place] [address], [place], [place] [address]  Culture, respiratory     Status: None   Collection Time: 10/23/17 10:50 AM  Result Value Ref Range Status   Specimen Description   Final    SPUTUM Performed at [facility] [address] [place], [place] [address]    Special Requests   Final    NONE Reflexed from [id number] Performed at [facility], [place] [address] [place], [place] [address]    Gram Stain   Final    MODERATE WBC PRESENT, PREDOMINANTLY PMN FEW GRAM NEGATIVE RODS FEW GRAM POSITIVE RODS RARE GRAM POSITIVE COCCI Performed at [facility], [place] [address] [place],  [address]    Culture FEW PSEUDOMONAS AERUGINOSA  Final   Report Status 10/25/2017 FINAL  Final   Organism ID, Bacteria PSEUDOMONAS AERUGINOSA  Final      Susceptibility   Pseudomonas aeruginosa - MIC*    CEFTAZIDIME 2 SENSITIVE Sensitive     CIPROFLOXACIN <=0.25 SENSITIVE Sensitive     GENTAMICIN <=1 SENSITIVE Sensitive     IMIPENEM 2 SENSITIVE Sensitive     PIP/TAZO <=4 SENSITIVE Sensitive     CEFEPIME <=1 SENSITIVE Sensitive     * FEW PSEUDOMONAS AERUGINOSA     Labs: BNP (last 3 results) Recent Labs    10/22/17 2326  BNP 26.7   Basic Metabolic Panel: No results for input(s): NA, K, CL, CO2, GLUCOSE, BUN, CREATININE, CALCIUM, MG, PHOS in the last 168  hours. Liver Function Tests: No results for input(s): AST, ALT, ALKPHOS, BILITOT, PROT, ALBUMIN in the last 168 hours. No results for input(s): LIPASE, AMYLASE in the last 168 hours. No results for input(s): AMMONIA in the last 168 hours. CBC: No results for input(s): WBC, NEUTROABS, HGB, HCT, MCV, PLT in the last 168 hours. Cardiac Enzymes: No results for input(s): CKTOTAL, CKMB, CKMBINDEX, TROPONINI in the last 168 hours. BNP: Invalid input(s): POCBNP CBG: Recent Labs  Lab 10/23/17 2131 10/24/17 0759  GLUCAP 237* 194*   D-Dimer No results for input(s): DDIMER in the last 72 hours. Hgb A1c No results for input(s): HGBA1C in the last 72 hours. Lipid Profile No results for input(s): CHOL, HDL, LDLCALC, TRIG, CHOLHDL, LDLDIRECT in the last 72 hours. Thyroid function studies No results for input(s): TSH, T4TOTAL, T3FREE, THYROIDAB in the last 72 hours.  Invalid input(s): FREET3 Anemia work up No results for input(s): VITAMINB12, FOLATE, FERRITIN, TIBC, IRON, RETICCTPCT in the last 72 hours. Urinalysis No results found for: COLORURINE, APPEARANCEUR, LABSPEC, [place], GLUCOSEU, [place], [place], KETONESUR, PROTEINUR, UROBILINOGEN, NITRITE, LEUKOCYTESUR Sepsis Labs Invalid input(s): PROCALCITONIN,  WBC,  LACTICIDVEN Microbiology Recent Results (from the past 240 hour(s))  Culture, blood (routine x 2)  Status: None   Collection Time: 10/23/17  2:55 AM  Result Value Ref Range Status   Specimen Description   Final    BLOOD LEFT ANTECUBITAL Performed at [facility], [address] [place], [place] [address]    Special Requests   Final    BOTTLES DRAWN AEROBIC AND ANAEROBIC Blood Culture adequate volume Performed at [facility], [place] [place], [place] [address]    Culture   Final    NO GROWTH 5 DAYS Performed at [facility], [place] [address] [place], [place] [address]    Report Status 10/28/2017 FINAL  Final  Culture, blood  (routine x 2)     Status: None   Collection Time: 10/23/17  3:00 AM  Result Value Ref Range Status   Specimen Description   Final    BLOOD RIGHT HAND Performed at [facility], [place] [place], [place] [address]    Special Requests   Final    BOTTLES DRAWN AEROBIC AND ANAEROBIC Blood Culture adequate volume Performed at [facility], [place] [place], [place] [address]    Culture   Final    NO GROWTH 5 DAYS Performed at [facility], [place] [address] [place], [place] [address]    Report Status 10/28/2017 FINAL  Final  Culture, blood (routine x 2) Call MD if unable to obtain prior to antibiotics being given     Status: None   Collection Time: 10/23/17  7:22 AM  Result Value Ref Range Status   Specimen Description   Final    BLOOD RIGHT ARM Performed at [facility] [address], [place], [place] [address]    Special Requests   Final    BOTTLES DRAWN AEROBIC ONLY Blood Culture results may not be optimal due to an inadequate volume of blood received in culture bottles Performed at [facility] [address] [place], [place] [address]    Culture   Final    NO GROWTH 5 DAYS Performed at [facility], [place] [address] [place], [place] [address]    Report Status 10/28/2017 FINAL  Final  Culture, blood (routine x 2) Call MD if unable to obtain prior to antibiotics being given     Status: None   Collection Time: 10/23/17  7:22 AM  Result Value Ref Range Status   Specimen Description   Final    BLOOD RIGHT HAND Performed at [facility] [address] [place], [place] [address]    Special Requests   Final    BOTTLES DRAWN AEROBIC ONLY Blood Culture results may not be optimal due to an inadequate volume of blood received in culture bottles Performed at [facility] [address], [place], [place] [address]    Culture   Final    NO GROWTH 5  DAYS Performed at [facility], [place] [address] [place], [place] [address]    Report Status 10/28/2017 FINAL  Final  Culture, sputum-assessment     Status: None   Collection Time: 10/23/17 10:50 AM  Result Value Ref Range Status   Specimen Description SPUTUM  Final   Special Requests NONE  Final   Sputum evaluation   Final    THIS SPECIMEN IS ACCEPTABLE FOR SPUTUM CULTURE Performed at [facility], [place] [address], [place], [place] [address]    Report Status 10/23/2017  FINAL  Final  Respiratory Panel by PCR     Status: None   Collection Time: 10/23/17 10:50 AM  Result Value Ref Range Status   Adenovirus NOT DETECTED NOT DETECTED Final   Coronavirus 229E NOT DETECTED NOT DETECTED Final   Coronavirus HKU1 NOT DETECTED NOT DETECTED Final   Coronavirus NL63 NOT DETECTED NOT DETECTED Final   Coronavirus OC43 NOT DETECTED NOT DETECTED Final   Metapneumovirus NOT DETECTED NOT DETECTED Final   Rhinovirus / Enterovirus NOT DETECTED NOT DETECTED Final   Influenza A NOT DETECTED NOT DETECTED Final   Influenza B NOT DETECTED NOT DETECTED Final   Parainfluenza Virus 1 NOT DETECTED NOT DETECTED Final   Parainfluenza Virus 2 NOT DETECTED NOT DETECTED Final   Parainfluenza Virus 3 NOT DETECTED NOT DETECTED Final   Parainfluenza Virus 4 NOT DETECTED NOT DETECTED Final   Respiratory Syncytial Virus NOT DETECTED NOT DETECTED Final   Bordetella pertussis NOT DETECTED NOT DETECTED Final   Chlamydophila pneumoniae NOT DETECTED NOT DETECTED Final   Mycoplasma pneumoniae NOT DETECTED NOT DETECTED Final    Comment: Performed at [facility], [place] [address] [place], [place] [address]  Culture, respiratory     Status: None   Collection Time: 10/23/17 10:50 AM  Result Value Ref Range Status   Specimen Description   Final    SPUTUM Performed at [facility] [address], [place], [place] [address]    Special Requests   Final    NONE Reflexed from  [phone] Performed at [facility], [place] [address] [place], [place] [address]    Gram Stain   Final    MODERATE WBC PRESENT, PREDOMINANTLY PMN FEW GRAM NEGATIVE RODS FEW GRAM POSITIVE RODS RARE GRAM POSITIVE COCCI Performed at [facility], [place] [address] [place], [place] [address]    Culture FEW PSEUDOMONAS AERUGINOSA  Final   Report Status 10/25/2017 FINAL  Final   Organism ID, Bacteria PSEUDOMONAS AERUGINOSA  Final      Susceptibility   Pseudomonas aeruginosa - MIC*    CEFTAZIDIME 2 SENSITIVE Sensitive     CIPROFLOXACIN <=0.25 SENSITIVE Sensitive     GENTAMICIN <=1 SENSITIVE Sensitive     IMIPENEM 2 SENSITIVE Sensitive     PIP/TAZO <=4 SENSITIVE Sensitive     CEFEPIME <=1 SENSITIVE Sensitive     * FEW PSEUDOMONAS AERUGINOSA     Time coordinating discharge: 35 minutes  SIGNED:   Charlynne Cousins, MD  [facility] 10/30/2017, 5:52 PM Pager   If 7PM-7AM, please contact night-coverage [email or web address] Password [email or web address]

## 2017-10-25 LAB — CULTURE, RESPIRATORY

## 2017-10-25 LAB — CULTURE, RESPIRATORY W GRAM STAIN

## 2017-10-25 LAB — LEGIONELLA PNEUMOPHILA SEROGP 1 UR AG: L. PNEUMOPHILA SEROGP 1 UR AG: NEGATIVE

## 2017-10-28 LAB — CULTURE, BLOOD (ROUTINE X 2)
CULTURE: NO GROWTH
CULTURE: NO GROWTH
Culture: NO GROWTH
Culture: NO GROWTH
Special Requests: ADEQUATE
Special Requests: ADEQUATE

## 2017-11-15 DIAGNOSIS — J449 Chronic obstructive pulmonary disease, unspecified: Secondary | ICD-10-CM | POA: Diagnosis not present

## 2017-11-15 DIAGNOSIS — F17210 Nicotine dependence, cigarettes, uncomplicated: Secondary | ICD-10-CM | POA: Diagnosis not present

## 2017-11-15 DIAGNOSIS — G4733 Obstructive sleep apnea (adult) (pediatric): Secondary | ICD-10-CM | POA: Diagnosis not present

## 2017-11-30 DIAGNOSIS — M13849 Other specified arthritis, unspecified hand: Secondary | ICD-10-CM | POA: Diagnosis not present

## 2017-11-30 DIAGNOSIS — M13841 Other specified arthritis, right hand: Secondary | ICD-10-CM | POA: Diagnosis not present

## 2017-11-30 DIAGNOSIS — M79641 Pain in right hand: Secondary | ICD-10-CM | POA: Diagnosis not present

## 2017-11-30 DIAGNOSIS — M25512 Pain in left shoulder: Secondary | ICD-10-CM | POA: Diagnosis not present

## 2017-12-20 DIAGNOSIS — Z87891 Personal history of nicotine dependence: Secondary | ICD-10-CM | POA: Diagnosis not present

## 2017-12-20 DIAGNOSIS — F17210 Nicotine dependence, cigarettes, uncomplicated: Secondary | ICD-10-CM | POA: Diagnosis not present

## 2017-12-27 DIAGNOSIS — M25512 Pain in left shoulder: Secondary | ICD-10-CM | POA: Diagnosis not present

## 2017-12-27 DIAGNOSIS — M25511 Pain in right shoulder: Secondary | ICD-10-CM | POA: Diagnosis not present

## 2017-12-29 ENCOUNTER — Other Ambulatory Visit: Payer: Self-pay | Admitting: Osteopathic Medicine

## 2018-01-03 DIAGNOSIS — M25511 Pain in right shoulder: Secondary | ICD-10-CM | POA: Diagnosis not present

## 2018-01-10 DIAGNOSIS — M25511 Pain in right shoulder: Secondary | ICD-10-CM | POA: Diagnosis not present

## 2018-01-10 DIAGNOSIS — M19011 Primary osteoarthritis, right shoulder: Secondary | ICD-10-CM | POA: Diagnosis not present

## 2018-01-10 DIAGNOSIS — M7541 Impingement syndrome of right shoulder: Secondary | ICD-10-CM | POA: Diagnosis not present

## 2018-01-11 ENCOUNTER — Other Ambulatory Visit: Payer: Self-pay

## 2018-01-11 DIAGNOSIS — E1165 Type 2 diabetes mellitus with hyperglycemia: Secondary | ICD-10-CM

## 2018-01-11 MED ORDER — GLUCOSE BLOOD VI STRP
ORAL_STRIP | 99 refills | Status: DC
Start: 2018-01-11 — End: 2020-09-17

## 2018-01-12 ENCOUNTER — Telehealth: Payer: Self-pay | Admitting: Osteopathic Medicine

## 2018-01-12 NOTE — Telephone Encounter (Signed)
I received a surgical clearance form for orthopedics, Dr. Alma Friendly.  They are asking for some specific testing prior to upcoming surgery.  Please have patient schedule a visit with me for presurgical clearance since will need to get EKG, blood work, urine screening, chest x-ray, etc.

## 2018-01-13 NOTE — Telephone Encounter (Signed)
Spoke to pt, surgery is scheduled for 01-23-18, clearance appt is scheduled with Dr Sheppard Coil on 01-19-18

## 2018-01-13 NOTE — Telephone Encounter (Signed)
Frederick Guzman see note below

## 2018-01-16 ENCOUNTER — Ambulatory Visit: Payer: Medicare Other | Admitting: Osteopathic Medicine

## 2018-01-19 ENCOUNTER — Encounter: Payer: Self-pay | Admitting: Osteopathic Medicine

## 2018-01-19 ENCOUNTER — Ambulatory Visit (INDEPENDENT_AMBULATORY_CARE_PROVIDER_SITE_OTHER): Payer: Medicare Other | Admitting: Osteopathic Medicine

## 2018-01-19 VITALS — BP 148/89 | HR 81 | Temp 97.7°F | Wt 206.5 lb

## 2018-01-19 DIAGNOSIS — E119 Type 2 diabetes mellitus without complications: Secondary | ICD-10-CM | POA: Diagnosis not present

## 2018-01-19 DIAGNOSIS — Z01818 Encounter for other preprocedural examination: Secondary | ICD-10-CM

## 2018-01-19 DIAGNOSIS — Z23 Encounter for immunization: Secondary | ICD-10-CM

## 2018-01-19 LAB — POCT GLYCOSYLATED HEMOGLOBIN (HGB A1C): HEMOGLOBIN A1C: 6.5 % — AB (ref 4.0–5.6)

## 2018-01-19 NOTE — Progress Notes (Signed)
HPI: Frederick Guzman is a 67 y.o. male who  has a past medical history of Arthritis, Asthma, Cancer ([place]) (08/2014), COPD (chronic obstructive pulmonary disease) ([place]), Depression, Diabetes mellitus without complication ([place]), Diverticulitis, Dyspnea, Fibromyalgia, GERD (gastroesophageal reflux disease), Hyperlipemia, Hypertension, Pneumonia (06/2015), and Sleep apnea.  he presents to [facility] today, 01/19/18,  for chief complaint of: Presurgical clearance Diabetes follow-up  Patient is scheduled for right shoulder arthroscopy next Monday.  We were asked by orthopedics to evaluate for surgical risk.  Patient adamantly declines repeat chest x-ray, EKG, or labs.  Last results for these on file or not totally normal/reassuring, I advised patient that we should probably repeat these but he does not feel that they should be done.  Please see below for assessment/plan.  Patient otherwise feels fine, he is eagerly anticipating getting the surgery done to help relieve shoulder pain.  He has been doing well since discharge from the hospital for treatment for pneumonia few months ago.     Past medical, surgical, social and family history reviewed:  Patient Active Problem List   Diagnosis Date Noted  . Benign essential HTN 10/23/2017  . Pulmonary nodules 10/23/2017  . Sepsis, unspecified organism ([place]) 10/23/2017  . 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 Positive depression screening 09/21/2016  . Uncontrolled type 2 diabetes mellitus with hyperglycemia, without long-term current use of insulin ([place]) 09/21/2016  . Type 2 diabetes mellitus without complications ([place]) 95/18/8416  . Status post shoulder surgery 08/09/2016  . Prosthetic shoulder infection, subsequent encounter 08/09/2016  . Infection and inflammatory reaction due to internal orthopedic device, implant, and graft ([place]) 03/31/2016  . S/P shoulder replacement, left 01/30/2016  . HCAP  (healthcare-associated pneumonia) 07/13/2015  . Bacterial pneumonia 07/13/2015  . Depression   . HTN (hypertension)   . Asthma exacerbation   . MDD (major depressive disorder) ([place]) 04/30/2011  . GERD 11/23/2010  . DERMATITIS, ATOPIC 11/23/2010  . TOBACCO ABUSE 03/10/2007  . OBSTRUCTIVE SLEEP APNEA 03/10/2007  . BRONCHITIS, CHRONIC 03/10/2007  . ASTHMA, MODERATE 03/10/2007    Past Surgical History:  Procedure Laterality Date  . carpel tunnel Bilateral   . COLONOSCOPY W/ POLYPECTOMY    . COLONOSCOPY W/ POLYPECTOMY    . EXCISIONAL TOTAL SHOULDER ARTHROPLASTY WITH ANTIBIOTIC SPACER Left 08/09/2016   Procedure: LEFT SHOULDER HARDWARE REMOVAL, IRRIGATION AND DEBRIDEMENT, PLACEMENT OF ANTIBIOTIC SPACER;  Surgeon: Netta Cedars, MD;  Location: [place];  Service: Orthopedics;  Laterality: Left;  . IRRIGATION AND DEBRIDEMENT SHOULDER Left 03/31/2016   Procedure: IRRIGATION AND DEBRIDEMENT SHOULDER, deep cultures and polyethylene exchange;  Surgeon: Netta Cedars, MD;  Location: [place];  Service: Orthopedics;  Laterality: Left;  requests 58mins  . REVERSE SHOULDER ARTHROPLASTY Left 01/30/2016   Procedure: LEFT REVERSE SHOULDER ARTHROPLASTY;  Surgeon: Netta Cedars, MD;  Location: [place];  Service: Orthopedics;  Laterality: Left;  . ROTATOR CUFF REPAIR Left   . TOTAL SHOULDER REVISION Left 01/17/2017   Procedure: LEFT SHOULDER REVISION REVERSE TOTAL SHOULDER ARTHROPLASTY;  Surgeon: Netta Cedars, MD;  Location: [place];  Service: Orthopedics;  Laterality: Left;  . WRIST SURGERY Right 11/2014   Dr. Amedeo Plenty    Social History   Tobacco Use  . Smoking status: Current Every Day Smoker    Packs/day: 2.00    Years: 42.00    Pack years: 84.00    Types: Cigarettes  . Smokeless tobacco: Never Used  . Tobacco comment: pt no longer taking chantix  Substance Use Topics  . Alcohol use: Yes  Alcohol/week: 7.0 standard drinks    Types: 4 Cans of beer, 3 Shots of liquor per week    Comment: beer or mixed  drink     Family History  Problem Relation Age of Onset  . Diabetes Mother   . Colon cancer Father   . Prostate cancer Father   . Diabetes Father      Current medication list and allergy/intolerance information reviewed:    Current Outpatient Medications  Medication Sig Dispense Refill  . albuterol (PROAIR HFA) 108 (90 Base) MCG/ACT inhaler Inhale 2 puffs into the lungs every 6 (six) hours as needed for wheezing or shortness of breath.    Marland Kitchen aspirin EC 81 MG tablet Take 81 mg by mouth See admin instructions. Takes 1 tablet every other day at bedtime.    Marland Kitchen atorvastatin (LIPITOR) 20 MG tablet Take 1 tablet (20 mg total) by mouth every other day. (Patient taking differently: Take 20 mg by mouth See admin instructions. Takes one tablet every other day at bedtime.) 90 tablet 1  . azithromycin (ZITHROMAX) 250 MG tablet Take on tab daily 6 each 0  . b complex vitamins tablet Take 1 tablet by mouth daily.    . budesonide-formoterol (SYMBICORT) 160-4.5 MCG/ACT inhaler Inhale 2 puffs into the lungs 2 (two) times daily.    Marland Kitchen buPROPion (WELLBUTRIN XL) 300 MG 24 hr tablet Take 1 tablet (300 mg total) by mouth daily. 90 tablet 1  . cetirizine (ZYRTEC) 10 MG tablet Take 10 mg by mouth daily as needed for allergies (seasonal allergies).     . DULoxetine (CYMBALTA) 30 MG capsule Take 1 capsule (30 mg total) by mouth daily. (Patient not taking: Reported on 10/23/2017) 90 capsule 0  . esomeprazole (NEXIUM) 40 MG capsule Take 1 capsule (40 mg total) by mouth daily. 90 capsule 1  . fluticasone (FLONASE) 50 MCG/ACT nasal spray Place 1 spray into both nostrils daily as needed for allergies.  1  . glimepiride (AMARYL) 2 MG tablet Take 1 tablet (2 mg total) by mouth daily. 90 tablet 1  . glucose blood test strip Use up to 4 times per day as directed with glucometer. Disp: 100. Refill x99 100 each 99  . ipratropium-albuterol (DUONEB) 0.5-2.5 (3) MG/3ML SOLN Take 3 mLs by nebulization every 6 (six) hours as needed.  (Patient taking differently: Take 3 mLs by nebulization every 6 (six) hours as needed (for shortness of breath). ) 360 mL 1  . lisinopril (PRINIVIL,ZESTRIL) 2.5 MG tablet Take 1 tablet (2.5 mg total) by mouth at bedtime. 90 tablet 3  . Multiple Vitamin (MULTIVITAMIN WITH MINERALS) TABS tablet Take 1 tablet by mouth at bedtime.    . Omega-3 Fatty Acids (FISH OIL) 1000 MG CAPS Take 1,000 mg by mouth at bedtime.     . predniSONE (DELTASONE) 10 MG tablet Takes 6 tablets for 1 days, then 5 tablets for 1 days, then 4 tablets for 1 days, then 3 tablets for 1 days, then 2 tabs for 1 days, then 1 tab for 1 days, and then stop. 21 tablet 0  . sildenafil (REVATIO) 20 MG tablet Take 0.5-4 tablets (10-80 mg total) by mouth daily as needed. Prior to sex (Patient not taking: Reported on 10/23/2017) 20 tablet 3  . tadalafil (CIALIS) 5 MG tablet Take 1 tablet (5 mg total) by mouth daily. Use daily. 90 tablet 1  . tamsulosin (FLOMAX) 0.4 MG CAPS capsule Take 1 capsule (0.4 mg total) by mouth 2 (two) times daily. 60 capsule 3  .  Testosterone 30 MG/ACT SOLN Apply 1-2 Pump topically daily. Alternates taking, 1 pump under one arm one day, 2 pumps the next day (1 pump under each arm). (Patient taking differently: Apply 1 Pump topically daily. 1 pump under one arm each day.) 90 mL 3  . tiotropium (SPIRIVA HANDIHALER) 18 MCG inhalation capsule Place 1 capsule (18 mcg total) into inhaler and inhale daily. 30 capsule 2  . vitamin C (ASCORBIC ACID) 500 MG tablet Take 500 mg by mouth daily.     No current facility-administered medications for this visit.     Allergies  Allergen Reactions  . Methotrexate Derivatives Nausea And Vomiting and Other (See Comments)    Mood change Changed his mood and nausea   . Ultram [Tramadol Hcl] Nausea And Vomiting      Review of Systems:  Constitutional:  No  fever, no chills, No recent illness, No unintentional weight changes. No significant fatigue.   HEENT: No  headache, no vision  change  Cardiac: No  chest pain, No  pressure, No palpitations, No  Orthopnea  Respiratory:  No  shortness of breath. No  Cough  Gastrointestinal: No  abdominal pain, No  nausea  Musculoskeletal: No new myalgia/arthralgia  Skin: No  Rash, No other wounds/concerning lesions  Hem/Onc: No  easy bruising/bleeding, No  abnormal lymph node  Endocrine: No cold intolerance,  No heat intolerance. No polyuria/polydipsia/polyphagia   Neurologic: No  weakness, No  dizziness  Psychiatric: No  concerns with depression, No  concerns with anxiety,   Exam:  BP (!) 148/89 (BP Location: Left Arm, Patient Position: Sitting, Cuff Size: Normal)   Pulse 81   Temp 97.7 F (36.5 C) (Oral)   Wt 206 lb 8 oz (93.7 kg)   BMI 28.01 kg/m   Constitutional: VS see above. General Appearance: alert, well-developed, well-nourished, NAD  Eyes: Normal lids and conjunctive, non-icteric sclera  Ears, Nose, Mouth, Throat: MMM, Normal external inspection ears/nares/mouth/lips/gums. Pharynx/tonsils no erythema, no exudate. Nasal mucosa normal.   Neck: No masses, trachea midline. No thyroid enlargement. No tenderness/mass appreciated. No lymphadenopathy  Respiratory: Normal respiratory effort. no wheeze, no rhonchi, no rales  Cardiovascular: S1/S2 normal, no murmur, no rub/gallop auscultated. RRR. No lower extremity edema.   Gastrointestinal: Nontender, no masses. No hepatomegaly, no splenomegaly. No hernia appreciated. Bowel sounds normal. Rectal exam deferred.   Musculoskeletal: Gait normal. No clubbing/cyanosis of digits.   Neurological: Normal balance/coordination. No tremor. No cranial nerve deficit on limited exam. Motor and sensation intact and symmetric. Cerebellar reflexes intact.   Skin: warm, dry, intact. No rash/ulcer.   Psychiatric: Normal judgment/insight. Normal mood and affect. Oriented x3.    Results for orders placed or performed in visit on 01/19/18 (from the past 72 hour(s))  POCT HgB  A1C     Status: Abnormal   Collection Time: 01/19/18  1:10 PM  Result Value Ref Range   Hemoglobin A1C 6.5 (A) 4.0 - 5.6 %   HbA1c POC (<> result, manual entry)     HbA1c, POC (prediabetic range)     HbA1c, POC (controlled diabetic range)        ASSESSMENT/PLAN:   Preoperative evaluation to rule out surgical contraindication - Plan: CBC, COMPLETE METABOLIC PANEL WITH GFR, Urinalysis, Routine w reflex microscopic  Need for influenza vaccination - Plan: Flu vaccine HIGH DOSE PF (Fluzone High dose)  Diabetes mellitus without complication ([place]) - Plan: POCT HgB A1C   Frederick Guzman is seen 01/19/18 for peri-operative risk stratification.    Abe People  J Eckhardt denies chest pain at rest or with exertion, denies dyspnea at rest or with exertion,reports lower extremity edema at ankles, denies claudication or palpitations.  Patient denies history of ischemic heart disease, CHF, CVD, alcohol or drug abuse, recent anticoagulation or antithrombotic use, personal or family history of neuropathy, or chronic kidney disease. He has Diabetes.    Patient hasnegative history of undergoing cardiac stress test or catheterization, coronary revascularization.    Patient's cardiac risk factors include COPD, tobacco use history.   According to the RCR I, this number of risk factors stratify as the patient to class 0 which carries with it a 3.9 percent risk of major cardiovascular complications such as MI, CHF, malignant arrhythmia.   In this case however the RCR I likely under-estimates the patient's true cardiac risk given his history of significant COPD, and unknown if any complicating factors on labs/imaging as patient declines these measures.   The above risks, along with the risk of perioperative cardiovascular complications, were discussed with the patient and his family, in light of the benefits of possible surgery. Patient and family wish to proceed with the operation. This assessment was  conveyed to the surgery team.  Patient Instructions  Please be aware that you have declined certain recommended tests, including additional blood work, urinalysis, EKG, chest x-ray.  These tests are routine prior to big surgeries for people with your medical comorbidities, what we are trying to do is rule out any reason that you would not be able to have surgery.    The final decision is up to your surgeon - if they are okay with basing surgical plan on previous results, that is fine.  Otherwise, you may experience a delay in your surgery if additional testing needs to be done.    Please call your surgeon this afternoon to confirm that they have received the paperwork for me, and please confirm if this is sufficient to proceed with surgery, or if you need to schedule further follow-up.     Visit summary with medication list and pertinent instructions was printed for patient to review. All questions at time of visit were answered - patient instructed to contact office with any additional concerns. ER/RTC precautions were reviewed with the patient.   Follow-up plan: Return in about 3 months (around 04/21/2018) for recheck A1C / diabetes. Sooner if needed .  Note: Total time spent 25 minutes, greater than 50% of the visit was spent face-to-face counseling and coordinating care for the following: The primary encounter diagnosis was Preoperative evaluation to rule out surgical contraindication. Diagnoses of Need for influenza vaccination and Diabetes mellitus without complication ([place]) were also pertinent to this visit.Marland Kitchen  Please note: voice recognition software was used to produce this document, and typos may escape review. Please contact Dr. Sheppard Coil for any needed clarifications.

## 2018-01-19 NOTE — Patient Instructions (Signed)
Please be aware that you have declined certain recommended tests, including additional blood work, urinalysis, EKG, chest x-ray.  These tests are routine prior to big surgeries for people with your medical comorbidities, what we are trying to do is rule out any reason that you would not be able to have surgery.    The final decision is up to your surgeon - if they are okay with basing surgical plan on previous results, that is fine.  Otherwise, you may experience a delay in your surgery if additional testing needs to be done.    Please call your surgeon this afternoon to confirm that they have received the paperwork for me, and please confirm if this is sufficient to proceed with surgery, or if you need to schedule further follow-up.

## 2018-01-20 LAB — URINALYSIS, ROUTINE W REFLEX MICROSCOPIC
Bilirubin Urine: NEGATIVE
Glucose, UA: NEGATIVE
HGB URINE DIPSTICK: NEGATIVE
Ketones, ur: NEGATIVE
LEUKOCYTES UA: NEGATIVE
NITRITE: NEGATIVE
PH: 6.5 (ref 5.0–8.0)
PROTEIN: NEGATIVE
Specific Gravity, Urine: 1.008 (ref 1.001–1.03)

## 2018-01-20 LAB — CBC
HCT: 44.9 % (ref 38.5–50.0)
Hemoglobin: 15.2 g/dL (ref 13.2–17.1)
MCH: 31 pg (ref 27.0–33.0)
MCHC: 33.9 g/dL (ref 32.0–36.0)
MCV: 91.6 fL (ref 80.0–100.0)
MPV: 9.3 fL (ref 7.5–12.5)
PLATELETS: 228 10*3/uL (ref 140–400)
RBC: 4.9 10*6/uL (ref 4.20–5.80)
RDW: 12.9 % (ref 11.0–15.0)
WBC: 6.7 10*3/uL (ref 3.8–10.8)

## 2018-01-20 LAB — COMPLETE METABOLIC PANEL WITH GFR
AG RATIO: 1.5 (calc) (ref 1.0–2.5)
ALT: 33 U/L (ref 9–46)
AST: 25 U/L (ref 10–35)
Albumin: 3.9 g/dL (ref 3.6–5.1)
Alkaline phosphatase (APISO): 74 U/L (ref 40–115)
BUN: 10 mg/dL (ref 7–25)
CALCIUM: 9.6 mg/dL (ref 8.6–10.3)
CO2: 31 mmol/L (ref 20–32)
Chloride: 106 mmol/L (ref 98–110)
Creat: 1.11 mg/dL (ref 0.70–1.25)
GFR, EST NON AFRICAN AMERICAN: 68 mL/min/{1.73_m2} (ref >=60)
GFR, Est African American: 79 mL/min/{1.73_m2} (ref >=60)
GLUCOSE: 87 mg/dL (ref 65–99)
Globulin: 2.6 g/dL (calc) (ref 1.9–3.7)
POTASSIUM: 4.3 mmol/L (ref 3.5–5.3)
Sodium: 143 mmol/L (ref 135–146)
Total Bilirubin: 0.5 mg/dL (ref 0.2–1.2)
Total Protein: 6.5 g/dL (ref 6.1–8.1)

## 2018-01-23 ENCOUNTER — Ambulatory Visit: Payer: Medicare Other | Admitting: Osteopathic Medicine

## 2018-01-26 ENCOUNTER — Encounter (HOSPITAL_COMMUNITY): Payer: Self-pay | Admitting: *Deleted

## 2018-01-26 DIAGNOSIS — Z85828 Personal history of other malignant neoplasm of skin: Secondary | ICD-10-CM | POA: Diagnosis not present

## 2018-01-26 DIAGNOSIS — L570 Actinic keratosis: Secondary | ICD-10-CM | POA: Diagnosis not present

## 2018-01-26 DIAGNOSIS — D0461 Carcinoma in situ of skin of right upper limb, including shoulder: Secondary | ICD-10-CM | POA: Diagnosis not present

## 2018-01-26 DIAGNOSIS — L300 Nummular dermatitis: Secondary | ICD-10-CM | POA: Diagnosis not present

## 2018-01-26 DIAGNOSIS — L821 Other seborrheic keratosis: Secondary | ICD-10-CM | POA: Diagnosis not present

## 2018-01-26 DIAGNOSIS — D044 Carcinoma in situ of skin of scalp and neck: Secondary | ICD-10-CM | POA: Diagnosis not present

## 2018-01-26 DIAGNOSIS — Z85820 Personal history of malignant melanoma of skin: Secondary | ICD-10-CM | POA: Diagnosis not present

## 2018-01-26 NOTE — H&P (Signed)
Patient's anticipated LOS is less than 2 midnights, meeting these requirements: - Younger than 27 - Lives within 1 hour of care - Has a competent adult at home to recover with post-op recover - NO history of  - Chronic pain requiring opiods  - Diabetes  - Coronary Artery Disease  - Heart failure  - Heart attack  - Stroke  - DVT/VTE  - Cardiac arrhythmia  - Respiratory Failure/COPD  - Renal failure  - Anemia  - Advanced Liver disease       Frederick Guzman is an 67 y.o. male.    Chief Complaint: right shoulder pain  HPI: Pt is a 68 y.o. male complaining of right shoulder pain for multiple years. Pain had continually increased since the beginning. X-rays in the clinic show slap tear and ac arthrosis. Pt has tried various conservative treatments which have failed to alleviate their symptoms, including injections and therapy. Various options are discussed with the patient. Risks, benefits and expectations were discussed with the patient. Patient understand the risks, benefits and expectations and wishes to proceed with surgery.   PCP:  [name], DO  D/C Plans: Home  PMH: Past Medical History:  Diagnosis Date  . Arthritis    OA  . Asthma   . Cancer ([place]) 08/2014   melanoma on R shoulder & back - [facility]   . COPD (chronic obstructive pulmonary disease) ([place])   . Depression    pt. denies- 01/2016  . Diabetes mellitus without complication ([place])   . Diverticulitis   . Dyspnea    with exertion  . Fibromyalgia   . GERD (gastroesophageal reflux disease)   . Hyperlipemia   . Hypertension    "never was high"  . Pneumonia 06/2015  . Sleep apnea    last study - Nov. 2017, waiting for a new machine, using the old CPAP q night after cleaning the device after he was documented to have pneumonia from his CPAP machine.     PSH: Past Surgical History:  Procedure Laterality Date  . carpel tunnel Bilateral   . COLONOSCOPY W/ POLYPECTOMY    . COLONOSCOPY W/  POLYPECTOMY    . EXCISIONAL TOTAL SHOULDER ARTHROPLASTY WITH ANTIBIOTIC SPACER Left 08/09/2016   Procedure: LEFT SHOULDER HARDWARE REMOVAL, IRRIGATION AND DEBRIDEMENT, PLACEMENT OF ANTIBIOTIC SPACER;  Surgeon: Netta Cedars, MD;  Location: [facility];  Service: Orthopedics;  Laterality: Left;  . IRRIGATION AND DEBRIDEMENT SHOULDER Left 03/31/2016   Procedure: IRRIGATION AND DEBRIDEMENT SHOULDER, deep cultures and polyethylene exchange;  Surgeon: Netta Cedars, MD;  Location: [place];  Service: Orthopedics;  Laterality: Left;  requests 51mins  . REVERSE SHOULDER ARTHROPLASTY Left 01/30/2016   Procedure: LEFT REVERSE SHOULDER ARTHROPLASTY;  Surgeon: Netta Cedars, MD;  Location: [place];  Service: Orthopedics;  Laterality: Left;  . ROTATOR CUFF REPAIR Left   . TOTAL SHOULDER REVISION Left 01/17/2017   Procedure: LEFT SHOULDER REVISION REVERSE TOTAL SHOULDER ARTHROPLASTY;  Surgeon: Netta Cedars, MD;  Location: [facility];  Service: Orthopedics;  Laterality: Left;  . WRIST SURGERY Right 11/2014   Dr. Amedeo Plenty    Social History:  reports that he has been smoking cigarettes. He has a 84.00 pack-year smoking history. He has never used smokeless tobacco. He reports that he drinks about 7.0 standard drinks of alcohol per week. He reports that he does not use drugs.  Allergies:  Allergies  Allergen Reactions  . Methotrexate Derivatives Nausea And Vomiting and Other (See Comments)    Mood change Changed his mood  and nausea   . Tramadol Nausea And Vomiting  . Ultram [Tramadol Hcl] Nausea And Vomiting    Medications: No current facility-administered medications for this encounter.    Current Outpatient Medications  Medication Sig Dispense Refill  . albuterol (PROAIR HFA) 108 (90 Base) MCG/ACT inhaler Inhale 2 puffs into the lungs every 6 (six) hours as needed for wheezing or shortness of breath.    Marland Kitchen aspirin EC 81 MG tablet Take 81 mg by mouth See admin instructions. Takes 1 tablet every other day at bedtime.    Marland Kitchen  atorvastatin (LIPITOR) 20 MG tablet Take 1 tablet (20 mg total) by mouth every other day. (Patient taking differently: Take 20 mg by mouth See admin instructions. Takes one tablet every other day at bedtime.) 90 tablet 1  . azithromycin (ZITHROMAX) 250 MG tablet Take on tab daily (Patient not taking: Reported on 01/19/2018) 6 each 0  . b complex vitamins tablet Take 1 tablet by mouth daily.    . budesonide-formoterol (SYMBICORT) 160-4.5 MCG/ACT inhaler Inhale 2 puffs into the lungs 2 (two) times daily.    Marland Kitchen buPROPion (WELLBUTRIN XL) 300 MG 24 hr tablet Take 1 tablet (300 mg total) by mouth daily. 90 tablet 1  . cetirizine (ZYRTEC) 10 MG tablet Take 10 mg by mouth daily as needed for allergies (seasonal allergies).     . DULoxetine (CYMBALTA) 30 MG capsule Take 1 capsule (30 mg total) by mouth daily. (Patient not taking: Reported on 10/23/2017) 90 capsule 0  . esomeprazole (NEXIUM) 40 MG capsule Take 1 capsule (40 mg total) by mouth daily. 90 capsule 1  . fluticasone (FLONASE) 50 MCG/ACT nasal spray Place 1 spray into both nostrils daily as needed for allergies.  1  . glimepiride (AMARYL) 2 MG tablet Take 1 tablet (2 mg total) by mouth daily. 90 tablet 1  . glucose blood test strip Use up to 4 times per day as directed with glucometer. Disp: 100. Refill x99 100 each 99  . ipratropium-albuterol (DUONEB) 0.5-2.5 (3) MG/3ML SOLN Take 3 mLs by nebulization every 6 (six) hours as needed. (Patient taking differently: Take 3 mLs by nebulization every 6 (six) hours as needed (for shortness of breath). ) 360 mL 1  . lisinopril (PRINIVIL,ZESTRIL) 2.5 MG tablet Take 1 tablet (2.5 mg total) by mouth at bedtime. 90 tablet 3  . Multiple Vitamin (MULTIVITAMIN WITH MINERALS) TABS tablet Take 1 tablet by mouth at bedtime.    . Omega-3 Fatty Acids (FISH OIL) 1000 MG CAPS Take 1,000 mg by mouth at bedtime.     . predniSONE (DELTASONE) 10 MG tablet Takes 6 tablets for 1 days, then 5 tablets for 1 days, then 4 tablets for 1  days, then 3 tablets for 1 days, then 2 tabs for 1 days, then 1 tab for 1 days, and then stop. (Patient not taking: Reported on 01/19/2018) 21 tablet 0  . sildenafil (REVATIO) 20 MG tablet Take 0.5-4 tablets (10-80 mg total) by mouth daily as needed. Prior to sex (Patient not taking: Reported on 10/23/2017) 20 tablet 3  . tadalafil (CIALIS) 5 MG tablet Take 1 tablet (5 mg total) by mouth daily. Use daily. 90 tablet 1  . tamsulosin (FLOMAX) 0.4 MG CAPS capsule Take 1 capsule (0.4 mg total) by mouth 2 (two) times daily. 60 capsule 3  . Testosterone 30 MG/ACT SOLN Apply 1-2 Pump topically daily. Alternates taking, 1 pump under one arm one day, 2 pumps the next day (1 pump under each arm). (Patient  taking differently: Apply 1 Pump topically daily. 1 pump under one arm each day.) 90 mL 3  . tiotropium (SPIRIVA HANDIHALER) 18 MCG inhalation capsule Place 1 capsule (18 mcg total) into inhaler and inhale daily. 30 capsule 2  . vitamin C (ASCORBIC ACID) 500 MG tablet Take 500 mg by mouth daily.      No results found for this or any previous visit (from the past 48 hour(s)). No results found.  ROS: Pain with rom of the right upper extremity  Physical Exam: Alert and oriented 67 y.o. male in no acute distress Cranial nerves 2-12 intact Cervical spine: full rom with no tenderness, nv intact distally Chest: active breath sounds bilaterally, no wheeze rhonchi or rales Heart: regular rate and rhythm, no murmur Abd: non tender non distended with active bowel sounds Hip is stable with rom  Right shoulder with limited rom due to pain nv intact distally No rashes or edema distally Strength limited due to pain  Assessment/Plan Assessment: right shoulder pain due to SLAP and AC arthrosis  Plan:  Patient will undergo a right shoulder scope by Dr. Veverly Fells at [facility]. Risks benefits and expectations were discussed with the patient. Patient understand risks, benefits and expectations and wishes to  proceed. Preoperative templating of the joint replacement has been completed, documented, and submitted to the Operating Room personnel in order to optimize intra-operative equipment management.   Merla Riches PA-C, MPAS [facility] is now [facility] [address], [place], [place], [place] [address] Phone: [phone] [email or web address] Facebook  [employer]

## 2018-01-27 ENCOUNTER — Other Ambulatory Visit: Payer: Self-pay

## 2018-01-27 ENCOUNTER — Encounter (HOSPITAL_COMMUNITY): Payer: Self-pay | Admitting: *Deleted

## 2018-01-27 NOTE — Anesthesia Preprocedure Evaluation (Addendum)
Anesthesia Evaluation  Patient identified by MRN, date of birth, ID band Patient awake    Reviewed: Allergy & Precautions, H&P , NPO status , Patient's Chart, lab work & pertinent test results  Airway Mallampati: II   Neck ROM: full    Dental   Pulmonary shortness of breath, sleep apnea , COPD, Current Smoker,    breath sounds clear to auscultation       Cardiovascular hypertension,  Rhythm:regular Rate:Normal     Neuro/Psych PSYCHIATRIC DISORDERS Anxiety Depression  Neuromuscular disease    GI/Hepatic GERD  ,  Endo/Other  diabetes, Type 2  Renal/GU      Musculoskeletal  (+) Arthritis , Fibromyalgia -  Abdominal   Peds  Hematology   Anesthesia Other Findings   Reproductive/Obstetrics                            Anesthesia Physical Anesthesia Plan  ASA: II  Anesthesia Plan: General   Post-op Pain Management:  Regional for Post-op pain   Induction: Intravenous  PONV Risk Score and Plan: 1 and Ondansetron, Dexamethasone, Midazolam and Treatment may vary due to age or medical condition  Airway Management Planned: Oral ETT  Additional Equipment:   Intra-op Plan:   Post-operative Plan: Extubation in OR  Informed Consent: I have reviewed the patients History and Physical, chart, labs and discussed the procedure including the risks, benefits and alternatives for the proposed anesthesia with the patient or authorized representative who has indicated his/her understanding and acceptance.     Plan Discussed with: CRNA, Anesthesiologist and Surgeon  Anesthesia Plan Comments: (See PAT note 01/27/2018 by Karoline Caldwell, PA-C )      Anesthesia Quick Evaluation

## 2018-01-27 NOTE — Progress Notes (Signed)
Spoke with pt's significant other, Frederick Guzman for pre-op call. DPR on file. She states pt does not have a cardiac history. Is treated for HTN. Pulmonary nodule found this year, being followed with CT scans. Pt is a type 2 diabetic. Last A1C was 6.5 on 01/19/18. Frederick states his fasting blood sugar is usually around 100. Instructed Frederick to have pt check his blood sugar when he gets up Monday AM. If blood sugar is 70 or below, treat with 1/2 cup of clear juice (apple or cranberry) and recheck blood sugar 15 minutes after drinking juice. Pt has stopped his Aspirin, Multivitamins and Fish Oil on 01/13/18.

## 2018-01-27 NOTE — Progress Notes (Signed)
Anesthesia Chart Review: SAME DAY WORKUP   Case:  [id number] Date/Time:  01/30/18 0715   Procedure:  RIGHT SHOULDER ARTHROSCOPY WITH OPEN DISTAL CLAVICLE RESECTION, SUBACROMIAL DECOMPRESSION AND POSSIBLE BICEPS TENODESIS (Right )   Anesthesia type:  Choice   Pre-op diagnosis:  Right shoulder pain, acromioclavicular arthrosis   Location:  [facility] OR ROOM 06 / [facility]   Surgeon:  Netta Cedars, MD      DISCUSSION: 67 yo male current smoker. Pertinent hx includes Asthma, COPD - moderate, HTN, DMII (well controlled, a1c 6.5 12/2017), DOE, OSA on CPAP.  Pt has medical clearance from PCP Dr. [name] 01/19/2018. Per her note, pt refused repeat preop CXR or EKG.  Pt follows with pulmonology at [facility], Peri Jefferson, [place]. Per notes he has moderate COPD, does not require supplemental O2, continues to smoke 1.5ppd. He is followed with serial monitoring chest CTs. Last CT 12/20/2017 showed "New irregular foci in the right upper lobe with the largest measuring 8 mm in the apex, possibly representing atelectasis or scarring."  3 month follow-up scan recommended.  Anticipate he can proceed as planned barring acute status change.  VS: There were no vitals taken for this visit.  PROVIDERS: [name], DO is PCP  Peri Jefferson, [place] is Pulmonology provider, last seen 11/15/2017   LABS: CBC and CMP from 01/19/2018 in Epic WNL   IMAGES: Chest CT 12/20/2017 (care everywhere): FINDINGS:  Lungs and bronchi: Diffuse moderate centrilobular emphysema with paraseptal emphysema greatest in the upper lobes. Bronchial wall thickening. Septal thickening with reticulation greatest in the anterior upper lobes. No consolidation.  Chronic/incidental/upper abdominal findings:  Small exophytic cyst upper pole right kidney. Minimal scattered LAD calcification. Left shoulder arthroplasty. Thoracic spondylosis.  IMPRESSION: New irregular foci in the right upper lobe with the largest measuring 8 mm in the apex,  possibly representing atelectasis or scarring.  RECOMMENDATIONS: 3 month follow-up with low dose CT or PET-CT  CT Chest 10/23/2017 (in setting of PNA): IMPRESSION: 1. Multifocal masslike areas of airspace opacity. Imaging features may reflect multifocal pneumonia or inflammatory process. Neoplasm not excluded but considered less likely. Continued close attention will be required. 2. Bilateral pulmonary nodules measuring up to 7 mm. Attention on follow-up recommended. 3. Prominent airway impaction right lower lobe. This may be infectious/inflammatory although aspiration could have this appearance. 4. Areas of subpleural reticulation bilaterally suggest underlying fibrotic lung disease. 5.  Aortic Atherosclerois (ICD10-170.0)  EKG: Pt refused to have repeat EKG at PCP clearance appointment 10/22/2017 (in the setting of PNA): Sinus tach. Rate 125. RAD. Incomplete RBBB.  01/12/2017: Normal sinus rhythm. Rate 70. Rightward axis  PULM: PFT 11/15/2017 (care everywhere):  Component Name Value Ref Range  FEV1 1.95 liters  FEV1/FVC 61.9 %  FVC 3.14 liters  PEAK FLOW 275.5 20 - 800 L/MIN  Result Impression  There is a moderate obstructive ventilatory defect.   Result Narrative  This result has an attachment that is not available.    Six minute walk test 10/18/2017: A six minute walk test was done in the office today. Patients pulse ox is 95% on Room Air at rest. Patients pulse ox is 92% at the beginning of the test and is 94% at the end of the test.Patient's lowest pulse ox is 92%.The patients heart rate was 86 at the beginning of the test and 92 at the end of the test. The patient walked 550 feet and did not desat below 92%.    CV: Stress echo 10/07/2010: Cardiology note by Dr  Dorie Rank in care everywhere 10/07/2010 states: Mild left ventricular hypertrophy with good contractility. Negative stress Echo for obvious ischemia.      Past Medical History:  Diagnosis Date  .  Arthritis    OA  . Asthma   . Cancer ([place]) 08/2014   melanoma on R shoulder & back - [facility]   . COPD (chronic obstructive pulmonary disease) ([place])   . Depression    pt. denies- 01/2016  . Diabetes mellitus without complication ([place])   . Diverticulitis   . Dyspnea    with exertion  . Fibromyalgia   . GERD (gastroesophageal reflux disease)   . Hyperlipemia   . Hypertension    "never was high"  . Pneumonia 06/2015  . Sleep apnea    last study - Nov. 2017, waiting for a new machine, using the old CPAP q night after cleaning the device after he was documented to have pneumonia from his CPAP machine.     Past Surgical History:  Procedure Laterality Date  . carpel tunnel Bilateral   . COLONOSCOPY W/ POLYPECTOMY    . COLONOSCOPY W/ POLYPECTOMY    . EXCISIONAL TOTAL SHOULDER ARTHROPLASTY WITH ANTIBIOTIC SPACER Left 08/09/2016   Procedure: LEFT SHOULDER HARDWARE REMOVAL, IRRIGATION AND DEBRIDEMENT, PLACEMENT OF ANTIBIOTIC SPACER;  Surgeon: Netta Cedars, MD;  Location: [facility];  Service: Orthopedics;  Laterality: Left;  . IRRIGATION AND DEBRIDEMENT SHOULDER Left 03/31/2016   Procedure: IRRIGATION AND DEBRIDEMENT SHOULDER, deep cultures and polyethylene exchange;  Surgeon: Netta Cedars, MD;  Location: [facility];  Service: Orthopedics;  Laterality: Left;  requests 75mins  . REVERSE SHOULDER ARTHROPLASTY Left 01/30/2016   Procedure: LEFT REVERSE SHOULDER ARTHROPLASTY;  Surgeon: Netta Cedars, MD;  Location: [facility];  Service: Orthopedics;  Laterality: Left;  . ROTATOR CUFF REPAIR Left   . TOTAL SHOULDER REVISION Left 01/17/2017   Procedure: LEFT SHOULDER REVISION REVERSE TOTAL SHOULDER ARTHROPLASTY;  Surgeon: Netta Cedars, MD;  Location: [facility];  Service: Orthopedics;  Laterality: Left;  . WRIST SURGERY Right 11/2014   Dr. Amedeo Plenty    MEDICATIONS: No current facility-administered medications for this encounter.    Marland Kitchen albuterol (PROAIR HFA) 108 (90 Base) MCG/ACT inhaler  . albuterol (PROVENTIL)  (2.5 MG/3ML) 0.083% nebulizer solution  . atorvastatin (LIPITOR) 20 MG tablet  . b complex vitamins tablet  . budesonide-formoterol (SYMBICORT) 160-4.5 MCG/ACT inhaler  . buPROPion (WELLBUTRIN XL) 300 MG 24 hr tablet  . cetirizine (ZYRTEC) 10 MG tablet  . esomeprazole (NEXIUM) 40 MG capsule  . fluticasone (FLONASE) 50 MCG/ACT nasal spray  . glimepiride (AMARYL) 2 MG tablet  . ketoconazole (NIZORAL) 2 % cream  . lisinopril (PRINIVIL,ZESTRIL) 2.5 MG tablet  . Multiple Vitamin (MULTIVITAMIN WITH MINERALS) TABS tablet  . Omega-3 Fatty Acids (FISH OIL) 1000 MG CAPS  . Polyvinyl Alcohol-Povidone (REFRESH OP)  . tadalafil (CIALIS) 5 MG tablet  . tamsulosin (FLOMAX) 0.4 MG CAPS capsule  . temazepam (RESTORIL) 30 MG capsule  . Testosterone 30 MG/ACT SOLN  . tiotropium (SPIRIVA HANDIHALER) 18 MCG inhalation capsule  . vitamin C (ASCORBIC ACID) 500 MG tablet  . aspirin EC 81 MG tablet  . azithromycin (ZITHROMAX) 250 MG tablet  . DULoxetine (CYMBALTA) 30 MG capsule  . glucose blood test strip  . predniSONE (DELTASONE) 10 MG tablet  . sildenafil (REVATIO) 20 MG tablet    Wynonia Musty [facility] Short Stay Center/Anesthesiology Phone [phone] 01/27/2018 10:25 AM

## 2018-01-30 ENCOUNTER — Encounter (HOSPITAL_COMMUNITY)
Admission: RE | Disposition: A | Discharge status: Home / Self Care | Payer: Self-pay | Source: Ambulatory Visit | Attending: Orthopedic Surgery

## 2018-01-30 ENCOUNTER — Other Ambulatory Visit: Payer: Self-pay

## 2018-01-30 ENCOUNTER — Ambulatory Visit (HOSPITAL_COMMUNITY): Payer: Medicare Other | Admitting: Physician Assistant

## 2018-01-30 ENCOUNTER — Ambulatory Visit (HOSPITAL_COMMUNITY)
Admission: RE | Admit: 2018-01-30 | Discharge: 2018-01-30 | Disposition: A | Discharge status: Home / Self Care | Payer: Medicare Other | Source: Ambulatory Visit | Attending: Orthopedic Surgery | Admitting: Orthopedic Surgery

## 2018-01-30 ENCOUNTER — Encounter (HOSPITAL_COMMUNITY): Payer: Self-pay | Admitting: *Deleted

## 2018-01-30 DIAGNOSIS — F329 Major depressive disorder, single episode, unspecified: Secondary | ICD-10-CM | POA: Diagnosis not present

## 2018-01-30 DIAGNOSIS — M797 Fibromyalgia: Secondary | ICD-10-CM | POA: Diagnosis not present

## 2018-01-30 DIAGNOSIS — I10 Essential (primary) hypertension: Secondary | ICD-10-CM | POA: Insufficient documentation

## 2018-01-30 DIAGNOSIS — E785 Hyperlipidemia, unspecified: Secondary | ICD-10-CM | POA: Insufficient documentation

## 2018-01-30 DIAGNOSIS — Z79899 Other long term (current) drug therapy: Secondary | ICD-10-CM | POA: Insufficient documentation

## 2018-01-30 DIAGNOSIS — Z7982 Long term (current) use of aspirin: Secondary | ICD-10-CM | POA: Diagnosis not present

## 2018-01-30 DIAGNOSIS — M7541 Impingement syndrome of right shoulder: Secondary | ICD-10-CM | POA: Diagnosis not present

## 2018-01-30 DIAGNOSIS — K219 Gastro-esophageal reflux disease without esophagitis: Secondary | ICD-10-CM | POA: Diagnosis not present

## 2018-01-30 DIAGNOSIS — G4730 Sleep apnea, unspecified: Secondary | ICD-10-CM | POA: Diagnosis not present

## 2018-01-30 DIAGNOSIS — F17210 Nicotine dependence, cigarettes, uncomplicated: Secondary | ICD-10-CM | POA: Insufficient documentation

## 2018-01-30 DIAGNOSIS — Z7984 Long term (current) use of oral hypoglycemic drugs: Secondary | ICD-10-CM | POA: Insufficient documentation

## 2018-01-30 DIAGNOSIS — E119 Type 2 diabetes mellitus without complications: Secondary | ICD-10-CM | POA: Insufficient documentation

## 2018-01-30 DIAGNOSIS — M19041 Primary osteoarthritis, right hand: Secondary | ICD-10-CM | POA: Insufficient documentation

## 2018-01-30 DIAGNOSIS — J449 Chronic obstructive pulmonary disease, unspecified: Secondary | ICD-10-CM | POA: Insufficient documentation

## 2018-01-30 DIAGNOSIS — G8918 Other acute postprocedural pain: Secondary | ICD-10-CM | POA: Diagnosis not present

## 2018-01-30 DIAGNOSIS — Z85820 Personal history of malignant melanoma of skin: Secondary | ICD-10-CM | POA: Insufficient documentation

## 2018-01-30 DIAGNOSIS — M19011 Primary osteoarthritis, right shoulder: Secondary | ICD-10-CM | POA: Diagnosis not present

## 2018-01-30 HISTORY — PX: STERIOD INJECTION: SHX5046

## 2018-01-30 HISTORY — PX: SHOULDER ARTHROSCOPY WITH DISTAL CLAVICLE RESECTION: SHX5675

## 2018-01-30 LAB — GLUCOSE, CAPILLARY
GLUCOSE-CAPILLARY: 143 mg/dL — AB (ref 70–99)
Glucose-Capillary: 142 mg/dL — ABNORMAL HIGH (ref 70–99)

## 2018-01-30 SURGERY — SHOULDER ARTHROSCOPY WITH DISTAL CLAVICLE RESECTION
Anesthesia: General | Site: Shoulder | Laterality: Right

## 2018-01-30 MED ORDER — PROPOFOL 10 MG/ML IV BOLUS
INTRAVENOUS | Status: DC | PRN
  Administered 2018-01-30: 20 mg via INTRAVENOUS
  Administered 2018-01-30: 150 mg via INTRAVENOUS

## 2018-01-30 MED ORDER — FENTANYL CITRATE (PF) 100 MCG/2ML IJ SOLN: 25.0000 ug | INTRAMUSCULAR | Status: DC | PRN

## 2018-01-30 MED ORDER — TIOTROPIUM BROMIDE MONOHYDRATE 18 MCG IN CAPS: 18.0000 ug | ORAL_CAPSULE | Freq: Every day | RESPIRATORY_TRACT | 2 refills | Status: DC

## 2018-01-30 MED ORDER — BUPIVACAINE-EPINEPHRINE 0.25% -1:200000 IJ SOLN
INTRAMUSCULAR | Status: DC | PRN
  Administered 2018-01-30: 10 mL

## 2018-01-30 MED ORDER — OXYCODONE HCL 5 MG/5ML PO SOLN: 5.0000 mg | Freq: Once | ORAL | Status: DC | PRN

## 2018-01-30 MED ORDER — SODIUM CHLORIDE 0.9 % IV SOLN
INTRAVENOUS | Status: DC | PRN
  Administered 2018-01-30: 25 ug/min via INTRAVENOUS

## 2018-01-30 MED ORDER — LIDOCAINE HCL (PF) 1 % IJ SOLN
INTRAMUSCULAR | Status: AC
  Filled 2018-01-30: qty 5

## 2018-01-30 MED ORDER — TRIAMCINOLONE ACETONIDE 40 MG/ML IJ SUSP
INTRAMUSCULAR | Status: DC | PRN
  Administered 2018-01-30: 5 mL via INTRA_ARTICULAR

## 2018-01-30 MED ORDER — METHOCARBAMOL 500 MG PO TABS: 500.0000 mg | ORAL_TABLET | Freq: Three times a day (TID) | ORAL | 1 refills | Status: DC | PRN

## 2018-01-30 MED ORDER — ONDANSETRON HCL 4 MG/2ML IJ SOLN
INTRAMUSCULAR | Status: AC
  Filled 2018-01-30: qty 2

## 2018-01-30 MED ORDER — BUPIVACAINE LIPOSOME 1.3 % IJ SUSP
INTRAMUSCULAR | Status: DC | PRN
  Administered 2018-01-30: 10 mL via PERINEURAL

## 2018-01-30 MED ORDER — OXYCODONE-ACETAMINOPHEN 5-325 MG PO TABS: 1.0000 | ORAL_TABLET | ORAL | 0 refills | Status: DC | PRN

## 2018-01-30 MED ORDER — CHLORHEXIDINE GLUCONATE 4 % EX LIQD: 60.0000 mL | Freq: Once | CUTANEOUS | Status: DC

## 2018-01-30 MED ORDER — ONDANSETRON HCL 4 MG/2ML IJ SOLN
INTRAMUSCULAR | Status: DC | PRN
  Administered 2018-01-30: 4 mg via INTRAVENOUS

## 2018-01-30 MED ORDER — LIDOCAINE 2% (20 MG/ML) 5 ML SYRINGE
INTRAMUSCULAR | Status: DC | PRN
  Administered 2018-01-30: 60 mg via INTRAVENOUS

## 2018-01-30 MED ORDER — LIDOCAINE 2% (20 MG/ML) 5 ML SYRINGE
INTRAMUSCULAR | Status: AC
  Filled 2018-01-30: qty 5

## 2018-01-30 MED ORDER — EPHEDRINE SULFATE 50 MG/ML IJ SOLN
INTRAMUSCULAR | Status: DC | PRN
  Administered 2018-01-30 (×3): 5 mg via INTRAVENOUS
  Administered 2018-01-30: 10 mg via INTRAVENOUS
  Administered 2018-01-30: 5 mg via INTRAVENOUS
  Administered 2018-01-30: 10 mg via INTRAVENOUS
  Administered 2018-01-30 (×2): 5 mg via INTRAVENOUS

## 2018-01-30 MED ORDER — LIDOCAINE HCL 1 % IJ SOLN
INTRAMUSCULAR | Status: DC | PRN
  Administered 2018-01-30: 5 mL

## 2018-01-30 MED ORDER — FENTANYL CITRATE (PF) 100 MCG/2ML IJ SOLN
INTRAMUSCULAR | Status: DC | PRN
  Administered 2018-01-30 (×4): 50 ug via INTRAVENOUS

## 2018-01-30 MED ORDER — CEFAZOLIN SODIUM-DEXTROSE 2-4 GM/100ML-% IV SOLN
2.0000 g | INTRAVENOUS | Status: AC
  Administered 2018-01-30: 2 g via INTRAVENOUS
  Filled 2018-01-30: qty 100

## 2018-01-30 MED ORDER — TRIAMCINOLONE ACETONIDE 40 MG/ML IJ SUSP
INTRAMUSCULAR | Status: AC
  Filled 2018-01-30: qty 5

## 2018-01-30 MED ORDER — OXYCODONE HCL 5 MG PO TABS: 5.0000 mg | ORAL_TABLET | Freq: Once | ORAL | Status: DC | PRN

## 2018-01-30 MED ORDER — BUPIVACAINE HCL (PF) 0.5 % IJ SOLN
INTRAMUSCULAR | Status: DC | PRN
  Administered 2018-01-30: 15 mL via PERINEURAL

## 2018-01-30 MED ORDER — BUPIVACAINE-EPINEPHRINE (PF) 0.25% -1:200000 IJ SOLN
INTRAMUSCULAR | Status: AC
  Filled 2018-01-30: qty 30

## 2018-01-30 MED ORDER — MIDAZOLAM HCL 5 MG/5ML IJ SOLN
INTRAMUSCULAR | Status: DC | PRN
  Administered 2018-01-30: 2 mg via INTRAVENOUS

## 2018-01-30 MED ORDER — SUCCINYLCHOLINE CHLORIDE 200 MG/10ML IV SOSY
PREFILLED_SYRINGE | INTRAVENOUS | Status: DC | PRN
  Administered 2018-01-30: 100 mg via INTRAVENOUS

## 2018-01-30 MED ORDER — MIDAZOLAM HCL 2 MG/2ML IJ SOLN
INTRAMUSCULAR | Status: AC
  Filled 2018-01-30: qty 2

## 2018-01-30 MED ORDER — FENTANYL CITRATE (PF) 250 MCG/5ML IJ SOLN
INTRAMUSCULAR | Status: AC
  Filled 2018-01-30: qty 5

## 2018-01-30 MED ORDER — ALBUMIN HUMAN 5 % IV SOLN
INTRAVENOUS | Status: DC | PRN
  Administered 2018-01-30: 09:00:00 via INTRAVENOUS

## 2018-01-30 MED ORDER — EPHEDRINE 5 MG/ML INJ
INTRAVENOUS | Status: AC
  Filled 2018-01-30: qty 10

## 2018-01-30 MED ORDER — LACTATED RINGERS IV SOLN
INTRAVENOUS | Status: DC | PRN
  Administered 2018-01-30: 07:00:00 via INTRAVENOUS

## 2018-01-30 MED ORDER — SODIUM CHLORIDE 0.9 % IR SOLN
Status: DC | PRN
  Administered 2018-01-30: 3000 mL

## 2018-01-30 MED ORDER — ALBUTEROL SULFATE HFA 108 (90 BASE) MCG/ACT IN AERS
INHALATION_SPRAY | RESPIRATORY_TRACT | Status: DC | PRN
  Administered 2018-01-30: 2 via RESPIRATORY_TRACT

## 2018-01-30 MED ORDER — PROPOFOL 10 MG/ML IV BOLUS
INTRAVENOUS | Status: AC
  Filled 2018-01-30: qty 20

## 2018-01-30 MED ORDER — SUCCINYLCHOLINE CHLORIDE 200 MG/10ML IV SOSY
PREFILLED_SYRINGE | INTRAVENOUS | Status: AC
  Filled 2018-01-30: qty 10

## 2018-01-30 MED ORDER — ONDANSETRON HCL 4 MG/2ML IJ SOLN: 4.0000 mg | Freq: Four times a day (QID) | INTRAMUSCULAR | Status: DC | PRN

## 2018-01-30 SURGICAL SUPPLY — 68 items
BLADE LONG MED 31MMX9MM (MISCELLANEOUS)
BLADE LONG MED 31X9 (MISCELLANEOUS) IMPLANT
BLADE SURG 11 STRL SS (BLADE) ×4 IMPLANT
BLADE W/14.0X25.5MM (BLADE) ×4 IMPLANT
BUR OVAL 4.0 (BURR) ×2 IMPLANT
CLOSURE WOUND 1/2 X4 (GAUZE/BANDAGES/DRESSINGS) ×1
COVER SURGICAL LIGHT HANDLE (MISCELLANEOUS) ×4 IMPLANT
COVER WAND RF STERILE (DRAPES) ×4 IMPLANT
DRAPE INCISE IOBAN 66X45 STRL (DRAPES) ×4 IMPLANT
DRAPE ORTHO SPLIT 77X108 STRL (DRAPES) ×8
DRAPE STERI 35X30 U-POUCH (DRAPES) ×4 IMPLANT
DRAPE SURG ORHT 6 SPLT 77X108 (DRAPES) ×4 IMPLANT
DRAPE U-SHAPE 47X51 STRL (DRAPES) ×4 IMPLANT
DRSG EMULSION OIL 3X3 NADH (GAUZE/BANDAGES/DRESSINGS) ×6 IMPLANT
DRSG PAD ABDOMINAL 8X10 ST (GAUZE/BANDAGES/DRESSINGS) ×8 IMPLANT
DURAPREP 26ML APPLICATOR (WOUND CARE) ×4 IMPLANT
ELECT NDL TIP 2.8 STRL (NEEDLE) ×2 IMPLANT
ELECT NEEDLE TIP 2.8 STRL (NEEDLE) ×4 IMPLANT
ELECT REM PT RETURN 9FT ADLT (ELECTROSURGICAL)
ELECTRODE REM PT RTRN 9FT ADLT (ELECTROSURGICAL) IMPLANT
GAUZE SPONGE 4X4 12PLY STRL (GAUZE/BANDAGES/DRESSINGS) ×4 IMPLANT
GLOVE BIOGEL PI ORTHO PRO 7.5 (GLOVE) ×2
GLOVE BIOGEL PI ORTHO PRO SZ8 (GLOVE) ×2
GLOVE ORTHO TXT STRL SZ7.5 (GLOVE) ×4 IMPLANT
GLOVE PI ORTHO PRO STRL 7.5 (GLOVE) ×2 IMPLANT
GLOVE PI ORTHO PRO STRL SZ8 (GLOVE) ×2 IMPLANT
GLOVE SURG ORTHO 8.5 STRL (GLOVE) ×4 IMPLANT
GOWN STRL REUS W/ TWL XL LVL3 (GOWN DISPOSABLE) ×8 IMPLANT
GOWN STRL REUS W/TWL XL LVL3 (GOWN DISPOSABLE) ×16
KIT BASIN OR (CUSTOM PROCEDURE TRAY) ×4 IMPLANT
KIT TURNOVER KIT B (KITS) ×4 IMPLANT
MANIFOLD NEPTUNE II (INSTRUMENTS) ×4 IMPLANT
NDL 25GX 5/8IN NON SAFETY (NEEDLE) IMPLANT
NDL HYPO 25GX1X1/2 BEV (NEEDLE) ×2 IMPLANT
NDL SPNL 18GX3.5 QUINCKE PK (NEEDLE) ×2 IMPLANT
NDL SUT .5 MAYO 1.404X.05X (NEEDLE) ×2 IMPLANT
NDL SUT 6 .5 CRC .975X.05 MAYO (NEEDLE) ×2 IMPLANT
NEEDLE 25GX 5/8IN NON SAFETY (NEEDLE) ×4 IMPLANT
NEEDLE HYPO 25GX1X1/2 BEV (NEEDLE) ×4 IMPLANT
NEEDLE MAYO TAPER (NEEDLE) ×8
NEEDLE SPNL 18GX3.5 QUINCKE PK (NEEDLE) ×4 IMPLANT
NS IRRIG 1000ML POUR BTL (IV SOLUTION) ×4 IMPLANT
PACK SHOULDER (CUSTOM PROCEDURE TRAY) ×4 IMPLANT
PAD ABD 8X10 STRL (GAUZE/BANDAGES/DRESSINGS) ×2 IMPLANT
PAD ARMBOARD 7.5X6 YLW CONV (MISCELLANEOUS) ×8 IMPLANT
RESECTOR FULL RADIUS 4.2MM (BLADE) ×4 IMPLANT
SET ARTHROSCOPY TUBING (MISCELLANEOUS) ×4
SET ARTHROSCOPY TUBING LN (MISCELLANEOUS) ×2 IMPLANT
SLING ARM FOAM STRAP LRG (SOFTGOODS) ×4 IMPLANT
SLING ARM FOAM STRAP MED (SOFTGOODS) IMPLANT
STRIP CLOSURE SKIN 1/2X4 (GAUZE/BANDAGES/DRESSINGS) ×3 IMPLANT
SUT BONE WAX W31G (SUTURE) ×4 IMPLANT
SUT FIBERWIRE #2 38 T-5 BLUE (SUTURE)
SUT MNCRL AB 3-0 PS2 18 (SUTURE) ×4 IMPLANT
SUT VIC AB 0 CT1 27 (SUTURE) ×4
SUT VIC AB 0 CT1 27XBRD ANBCTR (SUTURE) ×2 IMPLANT
SUT VIC AB 0 CT2 27 (SUTURE) IMPLANT
SUT VIC AB 2-0 CT1 27 (SUTURE) ×4
SUT VIC AB 2-0 CT1 TAPERPNT 27 (SUTURE) ×2 IMPLANT
SUTURE FIBERWR #2 38 T-5 BLUE (SUTURE) IMPLANT
SYR CONTROL 10ML LL (SYRINGE) ×8 IMPLANT
TOWEL OR 17X24 6PK STRL BLUE (TOWEL DISPOSABLE) ×4 IMPLANT
TOWEL OR 17X26 10 PK STRL BLUE (TOWEL DISPOSABLE) ×4 IMPLANT
TUBE CONNECTING 12'X1/4 (SUCTIONS) ×1
TUBE CONNECTING 12X1/4 (SUCTIONS) ×3 IMPLANT
WAND HAND CNTRL MULTIVAC 90 (MISCELLANEOUS) ×1 IMPLANT
WAND STAR VAC 90 (SURGICAL WAND) ×2 IMPLANT
WATER STERILE IRR 1000ML POUR (IV SOLUTION) ×4 IMPLANT

## 2018-01-30 NOTE — Anesthesia Procedure Notes (Signed)
Anesthesia Regional Block: Interscalene brachial plexus block   Pre-Anesthetic Checklist: ,, timeout performed, Correct Patient, Correct Site, Correct Laterality, Correct Procedure, Correct Position, site marked, Risks and benefits discussed,  Surgical consent,  Pre-op evaluation,  At surgeon's request and post-op pain management  Laterality: Right  Prep: chloraprep       Needles:  Injection technique: Single-shot  Needle Type: Echogenic Stimulator Needle     Needle Length: 5cm  Needle Gauge: 22     Additional Needles:   Procedures:, nerve stimulator,,,,,,,   Nerve Stimulator or Paresthesia:  Response: biceps flexion, 0.45 mA,   Additional Responses:   Narrative:  Start time: 01/30/2018 7:05 AM End time: 01/30/2018 7:12 AM Injection made incrementally with aspirations every 5 mL.  Performed by: Personally  Anesthesiologist: Albertha Ghee, MD  Additional Notes: Functioning IV was confirmed and monitors were applied.  A 39mm 22ga Arrow echogenic stimulator needle was used. Sterile prep and drape,hand hygiene and sterile gloves were used.  Negative aspiration and negative test dose prior to incremental administration of local anesthetic. The patient tolerated the procedure well.  Ultrasound guidance: relevent anatomy identified, needle position confirmed, local anesthetic spread visualized around nerve(s), vascular puncture avoided.  Image printed for medical record.

## 2018-01-30 NOTE — Discharge Instructions (Signed)
Ice to the shoulder constantly.  Keep the incision covered and clean and dry for one week, then ok to get it wet in the shower.  Do exercise as instructed several times per day.  Pendulums Rotations - hug and hitch hike Lap slides - hand on lap slide hand to the knee and back to the hip, back and forth  DO NOT reach behind your back or push up out of a chair with the operative arm.  Use a sling while you are up and around for comfort, may remove while seated.  Keep pillow propped behind the operative elbow.  Follow up with Dr Veverly Fells in two weeks in the office, call [phone] for appt   Post Anesthesia Home Care Instructions  Activity: Get plenty of rest for the remainder of the day. A responsible individual must stay with you for 24 hours following the procedure.  For the next 24 hours, DO NOT: -Drive a car -Paediatric nurse -Drink alcoholic beverages -Take any medication unless instructed by your physician -Make any legal decisions or sign important papers.  Meals: Start with liquid foods such as gelatin or soup. Progress to regular foods as tolerated. Avoid greasy, spicy, heavy foods. If nausea and/or vomiting occur, drink only clear liquids until the nausea and/or vomiting subsides. Call your physician if vomiting continues.  Special Instructions/Symptoms: Your throat may feel dry or sore from the anesthesia or the breathing tube placed in your throat during surgery. If this causes discomfort, gargle with warm salt water. The discomfort should disappear within 24 hours.  If you had a scopolamine patch placed behind your ear for the management of post- operative nausea and/or vomiting:  1. The medication in the patch is effective for 72 hours, after which it should be removed.  Wrap patch in a tissue and discard in the trash. Wash hands thoroughly with soap and water. 2. You may remove the patch earlier than 72 hours if you experience unpleasant side effects which may include  dry mouth, dizziness or visual disturbances. 3. Avoid touching the patch. Wash your hands with soap and water after contact with the patch.   Regional Anesthesia Blocks  1. Numbness or the inability to move the "blocked" extremity may last from 3-48 hours after placement. The length of time depends on the medication injected and your individual response to the medication. If the numbness is not going away after 48 hours, call your surgeon.  2. The extremity that is blocked will need to be protected until the numbness is gone and the  Strength has returned. Because you cannot feel it, you will need to take extra care to avoid injury. Because it may be weak, you may have difficulty moving it or using it. You may not know what position it is in without looking at it while the block is in effect.  3. For blocks in the legs and feet, returning to weight bearing and walking needs to be done carefully. You will need to wait until the numbness is entirely gone and the strength has returned. You should be able to move your leg and foot normally before you try and bear weight or walk. You will need someone to be with you when you first try to ensure you do not fall and possibly risk injury.  4. Bruising and tenderness at the needle site are common side effects and will resolve in a few days.  5. Persistent numbness or new problems with movement should be communicated to the  surgeon or the [facility] [phone] [place] [phone]).

## 2018-01-30 NOTE — Op Note (Signed)
NAME: ALP, GOLDWATER MEDICAL RECORD [id number] ACCOUNT [id number] DATE OF BIRTH:1950-12-30 FACILITY: [facility] LOCATION: [facility] PHYSICIAN:STEVEN Orlena Sheldon, MD  OPERATIVE REPORT  DATE OF PROCEDURE:  01/30/2018  PREOPERATIVE DIAGNOSES: 1.  Right shoulder impingement syndrome/shoulder pain as well as advanced acromioclavicular arthritis with outlet impingement and acromioclavicular joint cyst.  2.  Right hand long finger metacarpophalangeal arthritis, chronic.  POSTOPERATIVE DIAGNOSES: 1.  Right shoulder impingement syndrome/shoulder pain as well as advanced acromioclavicular arthritis with outlet impingement and acromioclavicular joint cyst.  2.  Right hand long finger metacarpophalangeal arthritis, chronic.  PROCEDURE PERFORMED: 1.  Right shoulder arthroscopy with arthroscopic subacromial decompression followed by open distal clavicle resection and cyst removal/____ procedure. 2.  Right hand long finger metacarpophalangeal cortisone injection with 20 mg of Kenalog and 0.5 mL of lidocaine.  ATTENDING SURGEON:  Esmond Plants, MD  ASSISTANT:  Darol Destine, [place], who was scrubbed during the entire procedure and necessary for satisfactory completion of surgery.  ANESTHESIA:  General anesthesia was used plus interscalene block.  ESTIMATED BLOOD LOSS:  Minimal.  FLUID REPLACEMENT:  1200 mL crystalloid.  INSTRUMENT COUNTS:  Correct.  COMPLICATIONS:  No complications.  ANTIBIOTICS:  Perioperative antibiotics were given.  INDICATIONS:  The patient is a 67 year old male with worsening right shoulder pain secondary to advanced AC joint arthritis with presence of an AC joint periarticular cyst.  The patient also has some arthritis starting in the glenohumeral joint, evidence  of some subacromial impingement syndrome.  Having failed conservative management, the patient desires operative treatment to restore function and eliminate pain.  The patient also requested on the day of  surgery a cortisone injection be done to the  right hand long finger MCP joint.  He has had this done in the office previously and has requested that be done today with him asleep.  Informed consent obtained.  DESCRIPTION OF PROCEDURE:  After adequate level of anesthesia was achieved, the patient was positioned in modified beach chair position.  Right shoulder correctly identified and sterilely prepped and draped in the usual manner.  Timeout was called.  We  examined the patient's shoulder under anesthesia revealing full passive range of motion with no undue stiffness, no instability.  After verification of correct patient, correct site, we entered the shoulder and standard arthroscopic portals including  anterior, posterior and lateral portals.  Identified an intact biceps tendon and intact superior labrum, biceps anchor.  There was some mild chondromalacia in a couple of areas.  This was spotty in the shoulder with some loose flaps and fibrillation.   Tangential chondroplasty performed of those areas, being careful not to take any cartilage.  We just removed the flaps and the fibrillation.  The labrum had some fraying along the anterior and posterior edges, but again, no extensive SLAP tear.  I pulled  the biceps into the joint with a shaver tip, and there was some mild synovitis in the biceps, but no tearing or splitting.  The rotator cuff was intact as visualized from the anterior and posterior portals, a little bit of fraying which we removed using  suction shaver, but really the cuff was basically intact.  I thought the cartilage looked really good comparatively.  Final lavage of the shoulder joint and placed the scope in the subacromial space.  A thorough bursectomy and acromioplasty was  performed.  There was intense bursitis noted with quite a bit of impingement from the overhanging acromion and hypertrophied CA ligament.  We recessed the CA ligament, getting that pressure off  the tendon  underneath and also did an acromioplasty with a  high speed bur, creating a type 1 acromial shaped with a butcher block technique.  We had a nice decompression of that rotator outlet.  The [facility] joint was completely arthritic with a large spur inferiorly.  At this point, having done our subacromial  decompression and inspected the rotator cuff, which was basically frayed, we removed some of that frayed tendon, but there were no high-grade partial or full-thickness tears as I palpated and visualized.  We went ahead and went to the [facility] joint and made a  small saber incision overlying the AC joint.  Dissection down through subcutaneous tissues using needle-tip Bovie.  Identified a large cyst.  This was taken out as a part of the deltotrapezial dissection.  We dissected in line with distal clavicle.   Subperiosteal dissection distal clavicle performed.  We removed about 3 mm to 4 mm of distal clavicle which incorporated all of the spur area.  We also removed that cyst, and we had nice decompression of that [facility] joint.  We left the AC ligaments, anterior  and posterior intact.  We also removed dorsal osteophytes off the acromion and hypertrophied capsule.  We then did a meticulous repair of the deltotrapezial fascia anatomically with 0 Vicryl suture after irrigating thoroughly and then subcutaneous  repair with 2-0 Vicryl and 4-0 Monocryl for skin and portals.  Steri-Strips applied followed by sterile dressing.  The patient tolerated surgery well.  LN/NUANCE  D:01/30/2018 T:01/30/2018 JOB:[id number]

## 2018-01-30 NOTE — Anesthesia Procedure Notes (Signed)
Procedure Name: Intubation Date/Time: 01/30/2018 7:49 AM Performed by: Lowella Dell, CRNA Pre-anesthesia Checklist: Patient identified, Emergency Drugs available, Suction available and Patient being monitored Patient Re-evaluated:Patient Re-evaluated prior to induction Oxygen Delivery Method: Circle System Utilized Preoxygenation: Pre-oxygenation with 100% oxygen Induction Type: IV induction Ventilation: Mask ventilation without difficulty and Oral airway inserted - appropriate to patient size Laryngoscope Size: Mac and 4 Grade View: Grade II Tube type: Oral Tube size: 7.5 mm Number of attempts: 1 Airway Equipment and Method: Stylet,  Oral airway and Bougie stylet Placement Confirmation: ETT inserted through vocal cords under direct vision,  positive ETCO2 and breath sounds checked- equal and bilateral Secured at: 24 cm Tube secured with: Tape Dental Injury: Teeth and Oropharynx as per pre-operative assessment  Comments: Performed by Norm Salt, SRNA.

## 2018-01-30 NOTE — Brief Op Note (Signed)
01/30/2018  9:11 AM  PATIENT:  Frederick Guzman  67 y.o. male  PRE-OPERATIVE DIAGNOSIS:  Right shoulder pain,impingement, acromioclavicular arthrosis Right hand long finger MCP arthritis  POST-OPERATIVE DIAGNOSIS:  Right shoulder pain, impingement, acromioclavicular arthrosis Right hand long finger MCP arthritis  PROCEDURE:  Procedure(s): RIGHT SHOULDER ARTHROSCOPY WITH OPEN DISTAL CLAVICLE RESECTION, SUBACROMIAL DECOMPRESSION (Right) STEROID INJECTION (Right) hand MCP long finger  SURGEON:  Surgeon(s) and Role:    Netta Cedars, MD - Primary  PHYSICIAN ASSISTANT:   ASSISTANTS: [place] Bruns, PA-C   ANESTHESIA:   regional and general  EBL:  minimal   BLOOD ADMINISTERED:none  DRAINS: none   LOCAL MEDICATIONS USED:  MARCAINE     SPECIMEN:  No Specimen  DISPOSITION OF SPECIMEN:  N/A  COUNTS:  YES  TOURNIQUET:  * No tourniquets in log *  DICTATION: .Other Dictation: Dictation Number [id number]  PLAN OF CARE: Discharge to home after PACU  PATIENT DISPOSITION:  PACU - hemodynamically stable.   Delay start of Pharmacological VTE agent (>24hrs) due to surgical blood loss or risk of bleeding: not applicable

## 2018-01-30 NOTE — Transfer of Care (Signed)
Immediate Anesthesia Transfer of Care Note  Patient: WESTIN KNOTTS  Procedure(s) Performed: RIGHT SHOULDER ARTHROSCOPY WITH OPEN DISTAL CLAVICLE RESECTION, SUBACROMIAL DECOMPRESSION (Right Shoulder) STEROID INJECTION (Right Hand)  Patient Location: PACU  Anesthesia Type:General  Level of Consciousness: drowsy and patient cooperative  Airway & Oxygen Therapy: Patient Spontanous Breathing and Patient connected to nasal cannula oxygen  Post-op Assessment: Report given to RN and Post -op Vital signs reviewed and stable  Post vital signs: Reviewed and stable  Last Vitals:  Vitals Value Taken Time  BP 139/66 01/30/2018  9:26 AM  Temp    Pulse 93 01/30/2018  9:26 AM  Resp    SpO2 95 % 01/30/2018  9:26 AM  Vitals shown include unvalidated device data.  Last Pain:  Vitals:   01/30/18 0611  TempSrc:   PainSc: 8       Patients Stated Pain Goal: 8 (37/44/51 4604)  Complications: No apparent anesthesia complications

## 2018-01-30 NOTE — Interval H&P Note (Signed)
History and Physical Interval Note:  01/30/2018 7:33 AM  Frederick Guzman  has presented today for surgery, with the diagnosis of Right shoulder pain, acromioclavicular arthrosis  The various methods of treatment have been discussed with the patient and family. After consideration of risks, benefits and other options for treatment, the patient has consented to  Procedure(s): RIGHT SHOULDER ARTHROSCOPY WITH OPEN DISTAL CLAVICLE RESECTION, SUBACROMIAL DECOMPRESSION AND POSSIBLE BICEPS TENODESIS (Right) as a surgical intervention .  The patient's history has been reviewed, patient examined, no change in status, stable for surgery.  I have reviewed the patient's chart and labs.  Questions were answered to the patient's satisfaction.     Brandee Markin,STEVEN R

## 2018-01-31 ENCOUNTER — Encounter (HOSPITAL_COMMUNITY): Payer: Self-pay | Admitting: Orthopedic Surgery

## 2018-01-31 NOTE — Anesthesia Postprocedure Evaluation (Signed)
Anesthesia Post Note  Patient: Frederick Guzman  Procedure(s) Performed: RIGHT SHOULDER ARTHROSCOPY WITH OPEN DISTAL CLAVICLE RESECTION, SUBACROMIAL DECOMPRESSION (Right Shoulder) STEROID INJECTION (Right Hand)     Patient location during evaluation: PACU Anesthesia Type: General Level of consciousness: awake and alert Pain management: pain level controlled Vital Signs Assessment: post-procedure vital signs reviewed and stable Respiratory status: spontaneous breathing, nonlabored ventilation, respiratory function stable and patient connected to nasal cannula oxygen Cardiovascular status: blood pressure returned to baseline and stable Postop Assessment: no apparent nausea or vomiting Anesthetic complications: no    Last Vitals:  Vitals:   01/30/18 0956 01/30/18 1011  BP: 126/68 (!) 120/56  Pulse: 88 85  Resp: (!) 26 14  Temp:    SpO2: 94% 94%    Last Pain:  Vitals:   01/30/18 0956  TempSrc:   PainSc: 0-No pain                 Leshea Jaggers S

## 2018-02-02 DIAGNOSIS — M25611 Stiffness of right shoulder, not elsewhere classified: Secondary | ICD-10-CM | POA: Diagnosis not present

## 2018-02-20 DIAGNOSIS — M25611 Stiffness of right shoulder, not elsewhere classified: Secondary | ICD-10-CM | POA: Diagnosis not present

## 2018-03-12 ENCOUNTER — Other Ambulatory Visit: Payer: Self-pay | Admitting: Osteopathic Medicine

## 2018-03-23 DIAGNOSIS — R918 Other nonspecific abnormal finding of lung field: Secondary | ICD-10-CM | POA: Diagnosis not present

## 2018-03-23 DIAGNOSIS — J439 Emphysema, unspecified: Secondary | ICD-10-CM | POA: Diagnosis not present

## 2018-03-27 DIAGNOSIS — L0109 Other impetigo: Secondary | ICD-10-CM | POA: Diagnosis not present

## 2018-03-27 DIAGNOSIS — L0889 Other specified local infections of the skin and subcutaneous tissue: Secondary | ICD-10-CM | POA: Diagnosis not present

## 2018-03-27 DIAGNOSIS — Z85820 Personal history of malignant melanoma of skin: Secondary | ICD-10-CM | POA: Diagnosis not present

## 2018-03-27 DIAGNOSIS — Z85828 Personal history of other malignant neoplasm of skin: Secondary | ICD-10-CM | POA: Diagnosis not present

## 2018-04-03 DIAGNOSIS — J449 Chronic obstructive pulmonary disease, unspecified: Secondary | ICD-10-CM | POA: Diagnosis not present

## 2018-04-03 DIAGNOSIS — R911 Solitary pulmonary nodule: Secondary | ICD-10-CM | POA: Diagnosis not present

## 2018-04-03 DIAGNOSIS — G4733 Obstructive sleep apnea (adult) (pediatric): Secondary | ICD-10-CM | POA: Diagnosis not present

## 2018-04-03 DIAGNOSIS — Z720 Tobacco use: Secondary | ICD-10-CM | POA: Diagnosis not present

## 2018-04-08 ENCOUNTER — Other Ambulatory Visit: Payer: Self-pay | Admitting: Osteopathic Medicine

## 2018-04-10 ENCOUNTER — Ambulatory Visit (INDEPENDENT_AMBULATORY_CARE_PROVIDER_SITE_OTHER): Payer: Medicare Other | Admitting: Osteopathic Medicine

## 2018-04-10 ENCOUNTER — Encounter: Payer: Self-pay | Admitting: Osteopathic Medicine

## 2018-04-10 VITALS — BP 135/75 | HR 92 | Temp 98.0°F | Wt 189.6 lb

## 2018-04-10 DIAGNOSIS — I10 Essential (primary) hypertension: Secondary | ICD-10-CM | POA: Diagnosis not present

## 2018-04-10 DIAGNOSIS — R195 Other fecal abnormalities: Secondary | ICD-10-CM | POA: Diagnosis not present

## 2018-04-10 DIAGNOSIS — E119 Type 2 diabetes mellitus without complications: Secondary | ICD-10-CM

## 2018-04-10 DIAGNOSIS — Z9889 Other specified postprocedural states: Secondary | ICD-10-CM | POA: Diagnosis not present

## 2018-04-10 DIAGNOSIS — E291 Testicular hypofunction: Secondary | ICD-10-CM | POA: Insufficient documentation

## 2018-04-10 DIAGNOSIS — Z125 Encounter for screening for malignant neoplasm of prostate: Secondary | ICD-10-CM | POA: Diagnosis not present

## 2018-04-10 LAB — POCT GLYCOSYLATED HEMOGLOBIN (HGB A1C): Hemoglobin A1C: 6.5 % — AB (ref 4.0–5.6)

## 2018-04-10 NOTE — Progress Notes (Signed)
HPI: Frederick Guzman is a 68 y.o. male who  has a past medical history of Arthritis, Asthma, Cancer ([place]) (08/2014), COPD (chronic obstructive pulmonary disease) ([place]), Depression, Diabetes mellitus without complication ([place]), Diverticulitis, Dyspnea, Fibromyalgia, GERD (gastroesophageal reflux disease), Hyperlipemia, Hypertension, Pneumonia (06/2015, 10/2016), and Sleep apnea.  he presents to [facility] today, 04/10/18,  for chief complaint of:  Dm2 and HTN follow-up Diarrhea   DIABETES SCREENING/PREVENTIVE CARE: Antidiabetic medications:  Current: Glimepiride 2 mg daily   Previous: Farxiga ($), Jardiance ($) A1C past 3-6 mos: Yes   09/21/16 8.4% -->stay on just Glimepiride due to intolerance to metformin, reluctant to take [place] due to cost, financial issues getting medications.   12/15/16: 7.0%   03/24/17: 6.2% - no hypoglycemic symptoms, patient would like to stay on current dose of glimepiride  06/27/17: 6.4%- no hypoglycemic symptoms.  10/17/17: 6.4%  04/10/18 today: reports Glc at home has been high for a couple weeks BP goal <130/80: Yes/close   06/27/17:135/80  10/17/17: elevated, pt reports anxiety  04/10/18 today: elevated on intake, recheck was better   LDL goal <70: due for recheck, has been more than a year  Eye exam annually: none on file, importance discussed with patient Foot exam: needs Microalbuminuria: N/a on ACE/ARB Metformin: started 09/2016 but intolerant  ACE/ARB: Yes  Antiplatelet if ASCVD Risk >10% - he is taking low dose ASA  Statin: Taking Pneumovax: done   Diarrhea: new issue. Several rounds of abx w/ shoulder surgery and recent dental procedures, amoxicilling for about a month. Loose stool preceded abx treatment however, and has persistent in the 5 days he's been off abx. No fever or chills. No abdominal pain but (+)bloating and "grumbling."     Patient is accompanied by wife who assists with  history-taking.    At today's visit... Past medical history, surgical history, and family history reviewed and updated as needed.  Current medication list and allergy/intolerance information reviewed and updated as needed. (See remainder of HPI, ROS, Phys Exam below)    Results for orders placed or performed in visit on 04/10/18 (from the past 72 hour(s))  POCT HgB A1C     Status: Abnormal   Collection Time: 04/10/18  8:42 AM  Result Value Ref Range   Hemoglobin A1C 6.5 (A) 4.0 - 5.6 %   HbA1c POC (<> result, manual entry)     HbA1c, POC (prediabetic range)     HbA1c, POC (controlled diabetic range)                ASSESSMENT/PLAN: The primary encounter diagnosis was Diabetes mellitus without complication ([place]). Diagnoses of Watery stools, Essential hypertension, Status post shoulder surgery, and Testosterone deficiency in male were also pertinent to this visit.   Orders Placed This Encounter  Procedures  . Stool Culture  . Ova and parasite examination  . Clostridium difficile Toxin B, Qualitative, Real-Time PCR  . Stool, WBC/Lactoferrin  . CBC with Differential/Platelet  . COMPLETE METABOLIC PANEL WITH GFR  . Lipid panel  . TSH  . PSA, Total with Reflex to PSA, Free  . Testosterone  . POCT HgB A1C     No orders of the defined types were placed in this encounter.   There are no Patient Instructions on file for this visit.    Follow-up plan: No follow-ups on file.                             ############################################ ############################################ ############################################ ############################################  No outpatient medications have been marked as taking for the 04/10/18 encounter (Appointment) with [name], DO.    Allergies  Allergen Reactions  . Methotrexate Derivatives Nausea And Vomiting and Other (See Comments)    Hallucinations and  aggressive behavior    . Tramadol Nausea And Vomiting    Hallucinations and aggressive behavior        Review of Systems:  Constitutional: No recent illness  HEENT: No  headache, no vision change  Cardiac: No  chest pain, No  pressure, No palpitations  Respiratory:  No  shortness of breath. No  Cough  Gastrointestinal: No  abdominal pain, +change on bowel habits  Musculoskeletal: No new myalgia/arthralgia  Skin: No  Rash  Hem/Onc: No  easy bruising/bleeding, No  abnormal lumps/bumps  Neurologic: +generalized fatigue and weakness, No  Dizziness   Exam:  BP (!) 164/74 (BP Location: Left Arm, Patient Position: Sitting, Cuff Size: Normal)   Pulse 92   Temp 98 F (36.7 C) (Oral)   Wt 189 lb 9.6 oz (86 kg)   BMI 25.01 kg/m   Constitutional: VS see above. General Appearance: alert, well-developed, well-nourished, NAD  Eyes: Normal lids and conjunctive, non-icteric sclera  Ears, Nose, Mouth, Throat: MMM, Normal external inspection ears/nares/mouth/lips/gums.  Neck: No masses, trachea midline.   Respiratory: Normal respiratory effort. no wheeze, no rhonchi, no rales  Cardiovascular: S1/S2 normal, no murmur, no rub/gallop auscultated. RRR.   Musculoskeletal: Gait normal. Symmetric and independent movement of all extremities  Abdominal: non-tender, non-distended, no appreciable organomegaly, neg Murphy's, BS WNLx4  Neurological: Normal balance/coordination. No tremor.  Skin: warm, dry, intact.   Psychiatric: Normal judgment/insight. Normal mood and affect. Oriented x3.       Visit summary with medication list and pertinent instructions was printed for patient to review, patient was advised to alert [place] if any updates are needed. All questions at time of visit were answered - patient instructed to contact office with any additional concerns. ER/RTC precautions were reviewed with the patient and understanding verbalized.   Note: Total time spent 25 minutes, greater  than 50% of the visit was spent face-to-face counseling and coordinating care for the following: The primary encounter diagnosis was Diabetes mellitus without complication ([place]). Diagnoses of Watery stools, Essential hypertension, Status post shoulder surgery, and Testosterone deficiency in male were also pertinent to this visit.Marland Kitchen  Please note: voice recognition software was used to produce this document, and typos may escape review. Please contact Dr. Sheppard Coil for any needed clarifications.    Follow up plan: Return in about 3 months (around 07/10/2018) for A1C RECHECK, see me in a week or two if no better with diarrhea / depending on lab results .

## 2018-04-11 LAB — COMPLETE METABOLIC PANEL WITH GFR
AG Ratio: 1.3 (calc) (ref 1.0–2.5)
ALBUMIN MSPROF: 3.5 g/dL — AB (ref 3.6–5.1)
ALKALINE PHOSPHATASE (APISO): 99 U/L (ref 40–115)
ALT: 46 U/L (ref 9–46)
AST: 19 U/L (ref 10–35)
BILIRUBIN TOTAL: 0.4 mg/dL (ref 0.2–1.2)
BUN: 9 mg/dL (ref 7–25)
CO2: 28 mmol/L (ref 20–32)
Calcium: 9.2 mg/dL (ref 8.6–10.3)
Chloride: 102 mmol/L (ref 98–110)
Creat: 0.91 mg/dL (ref 0.70–1.25)
GFR, Est African American: 100 mL/min/{1.73_m2} (ref >=60)
GFR, Est Non African American: 86 mL/min/{1.73_m2} (ref >=60)
GLOBULIN: 2.7 g/dL (ref 1.9–3.7)
GLUCOSE: 222 mg/dL — AB (ref 65–99)
Potassium: 4.2 mmol/L (ref 3.5–5.3)
SODIUM: 138 mmol/L (ref 135–146)
Total Protein: 6.2 g/dL (ref 6.1–8.1)

## 2018-04-11 LAB — CBC WITH DIFFERENTIAL/PLATELET
Absolute Monocytes: 462 cells/uL (ref 200–950)
BASOS ABS: 28 {cells}/uL (ref 0–200)
Basophils Relative: 0.4 %
Eosinophils Absolute: 331 cells/uL (ref 15–500)
Eosinophils Relative: 4.8 %
HCT: 42.1 % (ref 38.5–50.0)
HEMOGLOBIN: 14.3 g/dL (ref 13.2–17.1)
Lymphs Abs: 1904 cells/uL (ref 850–3900)
MCH: 30.3 pg (ref 27.0–33.0)
MCHC: 34 g/dL (ref 32.0–36.0)
MCV: 89.2 fL (ref 80.0–100.0)
MONOS PCT: 6.7 %
MPV: 8.8 fL (ref 7.5–12.5)
NEUTROS ABS: 4175 {cells}/uL (ref 1500–7800)
Neutrophils Relative %: 60.5 %
Platelets: 294 10*3/uL (ref 140–400)
RBC: 4.72 10*6/uL (ref 4.20–5.80)
RDW: 12.1 % (ref 11.0–15.0)
Total Lymphocyte: 27.6 %
WBC: 6.9 10*3/uL (ref 3.8–10.8)

## 2018-04-11 LAB — TESTOSTERONE: TESTOSTERONE: 350 ng/dL (ref 250–827)

## 2018-04-11 LAB — PSA, TOTAL WITH REFLEX TO PSA, FREE: PSA, TOTAL: 2.6 ng/mL (ref <=4.0)

## 2018-04-11 LAB — LIPID PANEL
Cholesterol: 108 mg/dL (ref <200)
HDL: 25 mg/dL — ABNORMAL LOW (ref >40)
LDL CHOLESTEROL (CALC): 63 mg/dL
NON-HDL CHOLESTEROL (CALC): 83 mg/dL (ref <130)
TRIGLYCERIDES: 114 mg/dL (ref <150)
Total CHOL/HDL Ratio: 4.3 (calc) (ref <5.0)

## 2018-04-11 LAB — TSH: TSH: 3.21 mIU/L (ref 0.40–4.50)

## 2018-04-12 ENCOUNTER — Ambulatory Visit: Payer: Medicare Other | Admitting: Osteopathic Medicine

## 2018-04-12 ENCOUNTER — Telehealth: Payer: Self-pay

## 2018-04-12 DIAGNOSIS — A09 Infectious gastroenteritis and colitis, unspecified: Secondary | ICD-10-CM

## 2018-04-12 DIAGNOSIS — R197 Diarrhea, unspecified: Secondary | ICD-10-CM

## 2018-04-12 MED ORDER — DIPHENOXYLATE-ATROPINE 2.5-0.025 MG PO TABS: 1.0000 | ORAL_TABLET | Freq: Four times a day (QID) | ORAL | 1 refills | Status: DC | PRN

## 2018-04-12 NOTE — Telephone Encounter (Signed)
Can call him back and let him know I put in an order for a CT scan, he should be getting a call to schedule this, probably sometime tomorrow.  I sent in an antidiarrheal medication to the pharmacy for him

## 2018-04-12 NOTE — Telephone Encounter (Signed)
Pt called stating that he continues to have severe diarrhea. Does provider have any recommendations. Pls advise, thanks.

## 2018-04-13 ENCOUNTER — Other Ambulatory Visit: Payer: Self-pay | Admitting: Osteopathic Medicine

## 2018-04-13 NOTE — Telephone Encounter (Signed)
Left a detailed vm msg for pt regarding provider's note & recommendation. Direct call back info provided.  

## 2018-04-14 ENCOUNTER — Ambulatory Visit (HOSPITAL_BASED_OUTPATIENT_CLINIC_OR_DEPARTMENT_OTHER)
Admission: RE | Admit: 2018-04-14 | Discharge: 2018-04-14 | Disposition: A | Discharge status: Home / Self Care | Payer: Medicare Other | Source: Ambulatory Visit | Attending: Osteopathic Medicine | Admitting: Osteopathic Medicine

## 2018-04-14 ENCOUNTER — Encounter (HOSPITAL_BASED_OUTPATIENT_CLINIC_OR_DEPARTMENT_OTHER): Payer: Self-pay

## 2018-04-14 ENCOUNTER — Encounter: Payer: Self-pay | Admitting: Gastroenterology

## 2018-04-14 ENCOUNTER — Telehealth: Payer: Self-pay

## 2018-04-14 ENCOUNTER — Other Ambulatory Visit: Payer: Self-pay | Admitting: Osteopathic Medicine

## 2018-04-14 DIAGNOSIS — K5730 Diverticulosis of large intestine without perforation or abscess without bleeding: Secondary | ICD-10-CM | POA: Diagnosis not present

## 2018-04-14 DIAGNOSIS — A09 Infectious gastroenteritis and colitis, unspecified: Secondary | ICD-10-CM | POA: Insufficient documentation

## 2018-04-14 DIAGNOSIS — R197 Diarrhea, unspecified: Secondary | ICD-10-CM | POA: Insufficient documentation

## 2018-04-14 DIAGNOSIS — K529 Noninfective gastroenteritis and colitis, unspecified: Secondary | ICD-10-CM

## 2018-04-14 IMAGING — CT CT ABD-PELV W/ CM
2 of 5 series · 16 of 46 positions shown, 18 images · IV contrast (APPLIED)
Comparison: None.

CLINICAL DATA: Diarrhea for 2 months. 20 lb weight loss. Infectious
gastroenteritis and colitis.

EXAM:
CT ABDOMEN AND PELVIS WITH CONTRAST
TECHNIQUE: Multidetector CT imaging of the abdomen and pelvis was performed
using the standard protocol following bolus administration of
intravenous contrast.
CONTRAST:  100mL [X4] IOPAMIDOL ([X4]) INJECTION 61%

[Series 2: axial st · axial · 0.84mm/px · z∈[-464,-49]mm · 13 of 93 slices shown, 15 images]
[im 5/93  soft-tissue]
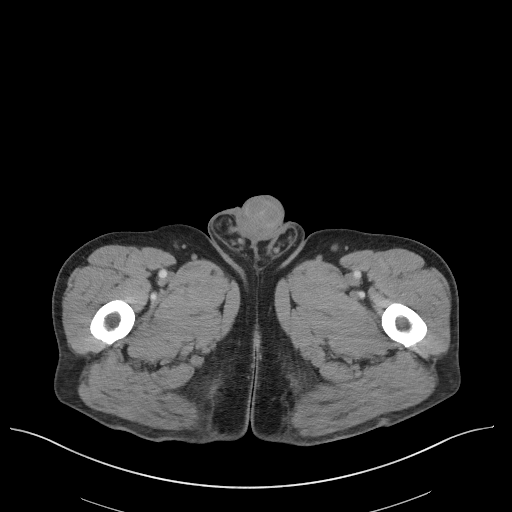
[im 5/93  bone]
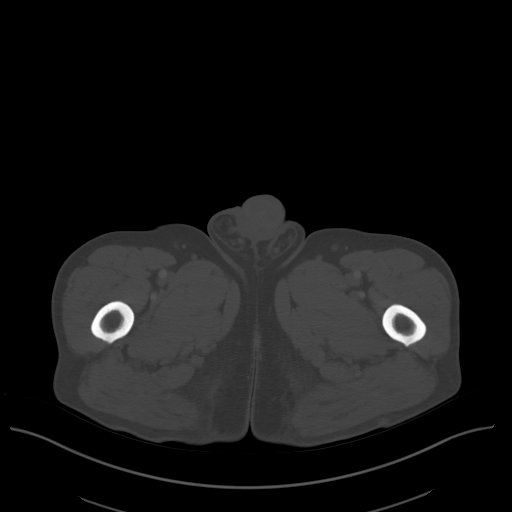
[im 14/93  soft-tissue]
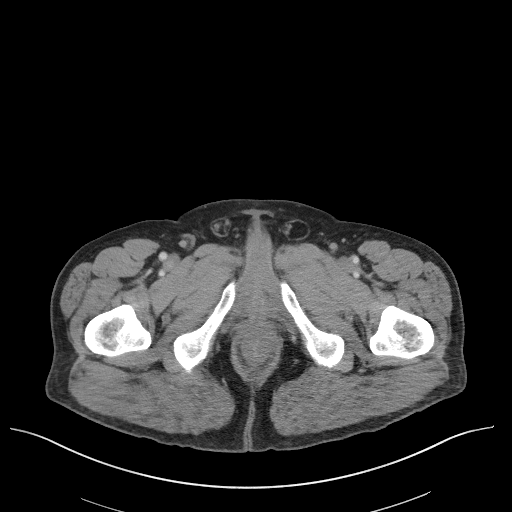
[im 19/93  soft-tissue]
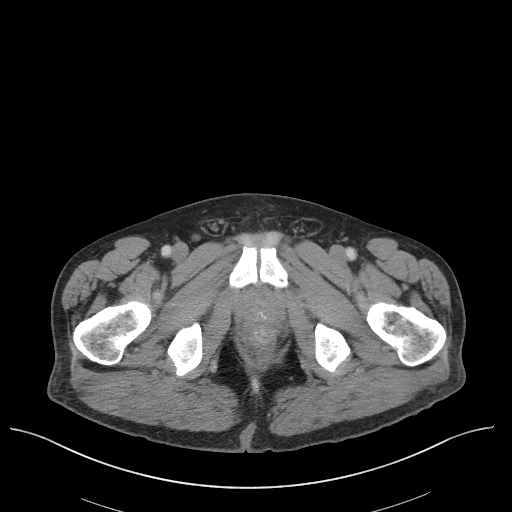
[im 28/93  soft-tissue]
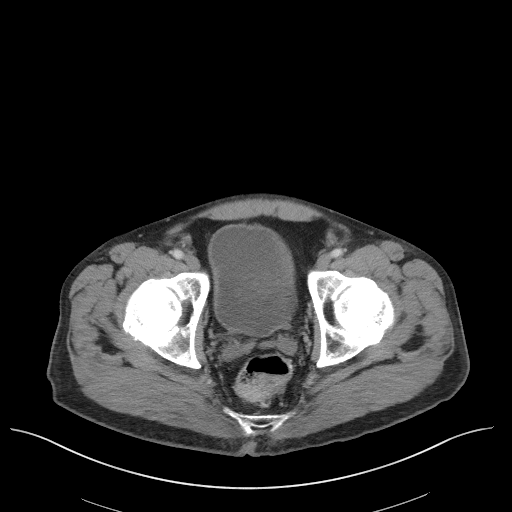
[im 33/93  soft-tissue]
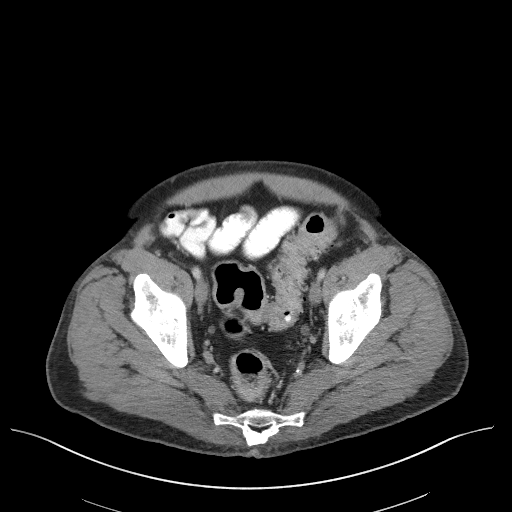
[im 42/93  soft-tissue]
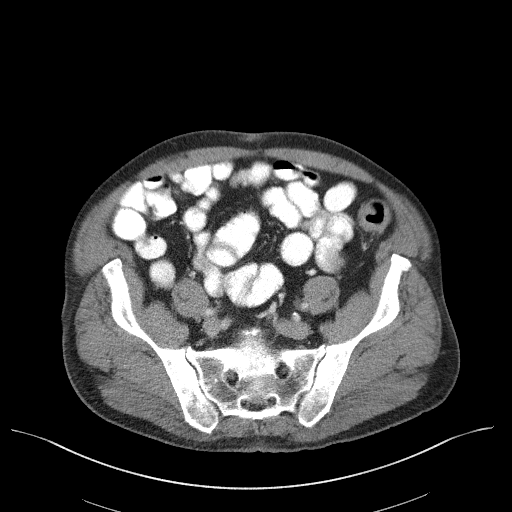
[im 47/93  soft-tissue]
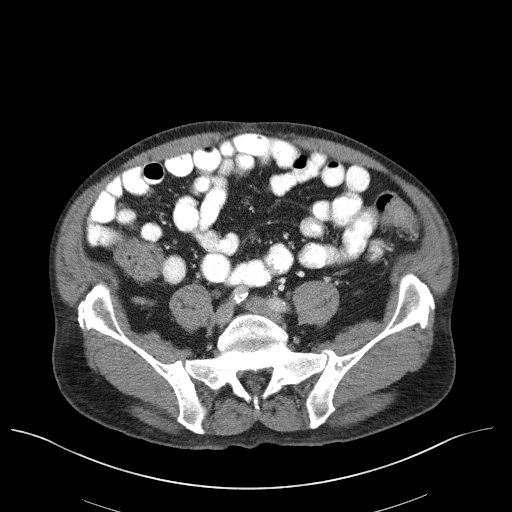
[im 51/93  soft-tissue]
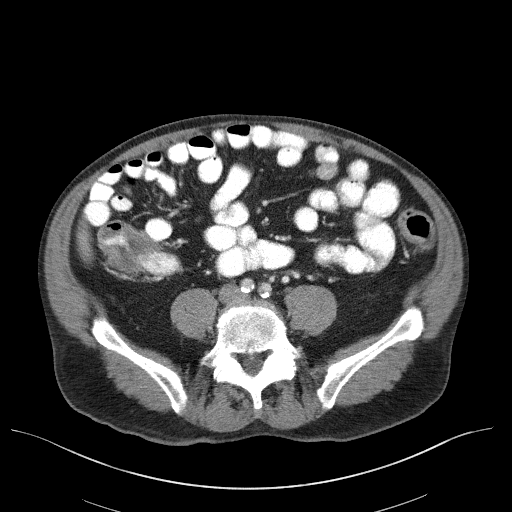
[im 60/93  soft-tissue]
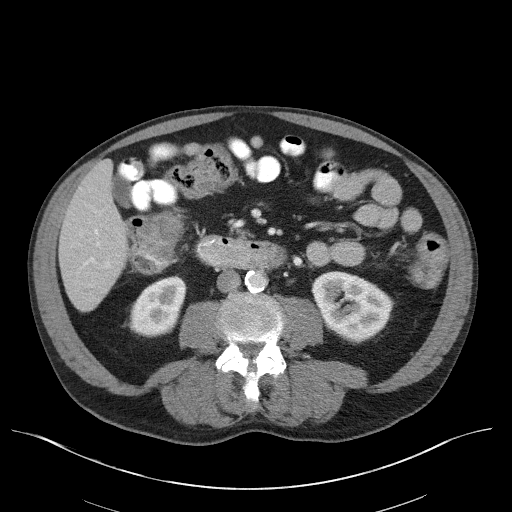
[im 60/93  bone]
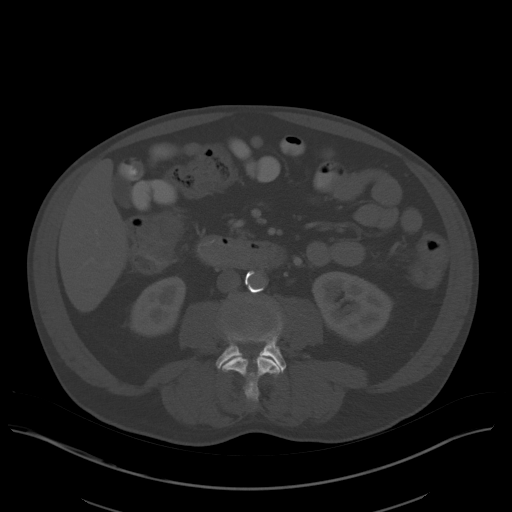
[im 65/93  soft-tissue]
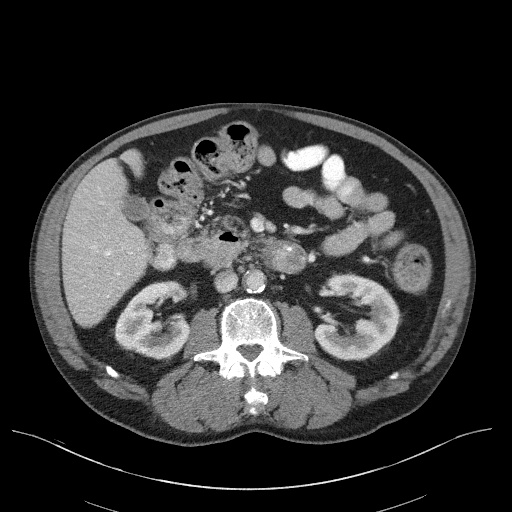
[im 74/93  soft-tissue]
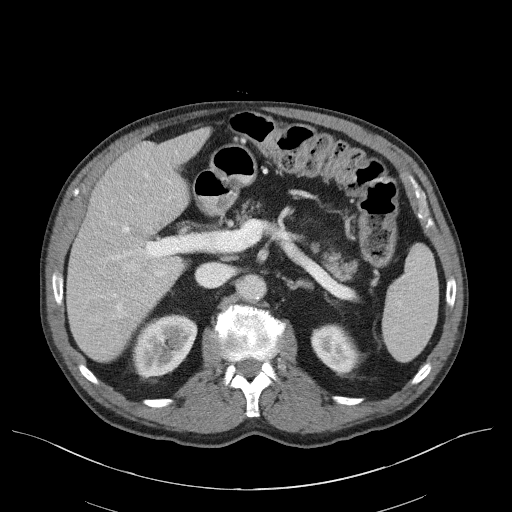
[im 79/93  soft-tissue]
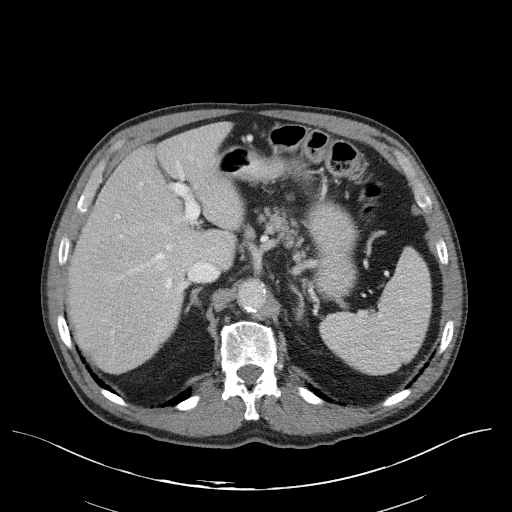
[im 88/93  soft-tissue]
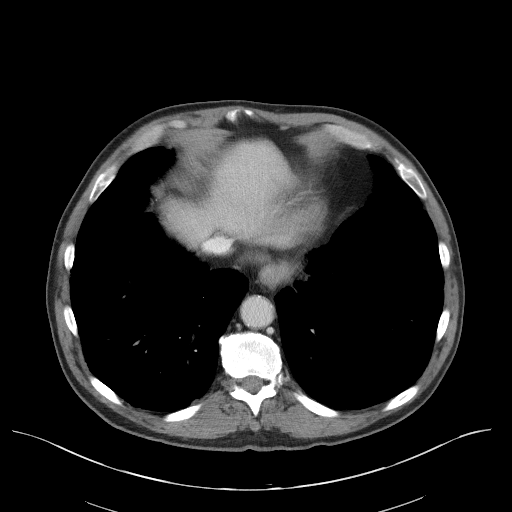

[Series 5: coronal st · coronal · 0.79mm/px · 3 of 101 slices shown]
[im 34/101  soft-tissue]
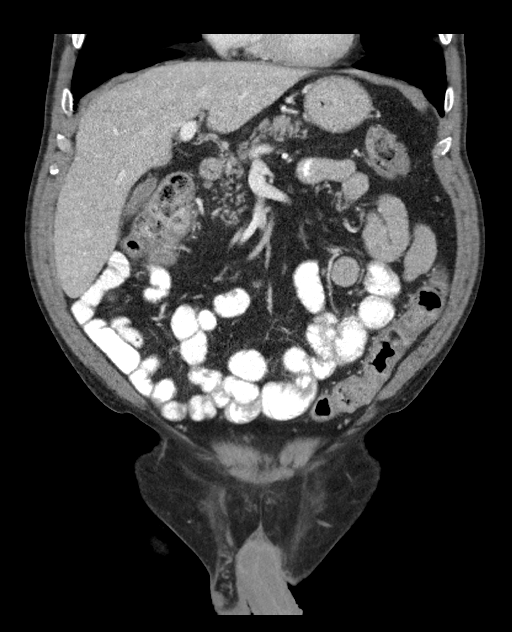
[im 45/101  soft-tissue]
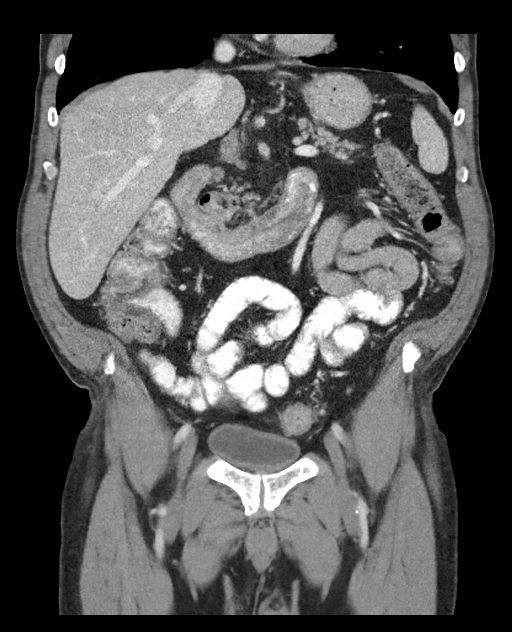
[im 56/101  soft-tissue]
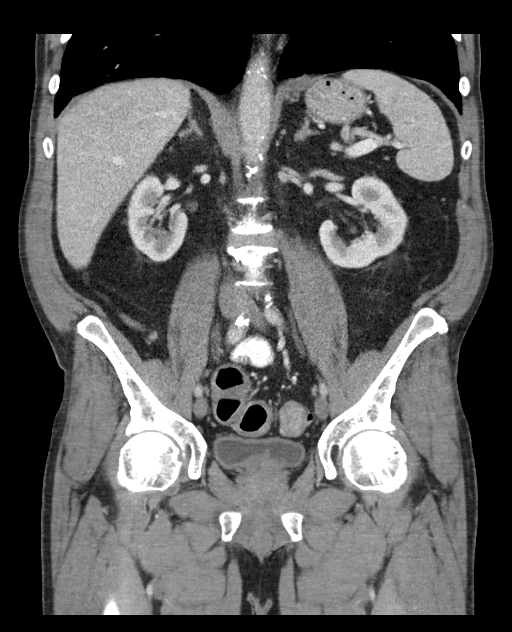

[16 of 46 positions shown; findings below may reference images not displayed]

FINDINGS: Lower Chest: No acute findings. Chronic bibasilar interstitial lung
disease.

Hepatobiliary: No hepatic masses identified. Gallbladder is
unremarkable. No evidence of biliary ductal dilatation.

Pancreas:  No mass or inflammatory changes.

Spleen: Within normal limits in size and appearance.

Adrenals/Urinary Tract: No masses identified. Small benign right
renal cyst noted. No evidence of ureteral calculi or hydronephrosis.

Stomach/Bowel: No evidence of obstruction, inflammatory process or
abnormal fluid collections. Diverticulosis is seen mainly involving
the sigmoid colon, however there is no evidence of diverticulitis.
Normal appendix visualized.

Vascular/Lymphatic: No pathologically enlarged lymph nodes. No
abdominal aortic aneurysm. Aortic atherosclerosis.

Reproductive:  No mass or other significant abnormality.

Other:  None.

Musculoskeletal:  No suspicious bone lesions identified.
IMPRESSION: Colonic diverticulosis, without radiographic evidence of
diverticulitis or other acute findings.

## 2018-04-14 MED ORDER — IOPAMIDOL (ISOVUE-300) INJECTION 61%
100.0000 mL | Freq: Once | INTRAVENOUS | Status: AC | PRN
  Administered 2018-04-14: 100 mL via INTRAVENOUS

## 2018-04-14 NOTE — Telephone Encounter (Signed)
Eddie Dibbles from [facility] called stating that diphenoxylate-atropine has been approved. PA is valid for 1 year.

## 2018-04-15 ENCOUNTER — Other Ambulatory Visit: Payer: Self-pay | Admitting: Osteopathic Medicine

## 2018-04-15 LAB — STOOL CULTURE
MICRO NUMBER: 79909
MICRO NUMBER: 79910
MICRO NUMBER:: 79911
SHIGA RESULT: NOT DETECTED
SPECIMEN QUALITY: ADEQUATE
SPECIMEN QUALITY: ADEQUATE
SPECIMEN QUALITY:: ADEQUATE

## 2018-04-15 LAB — OVA AND PARASITE EXAMINATION
CONCENTRATE RESULT:: NONE SEEN
MICRO NUMBER:: 78959
SPECIMEN QUALITY: ADEQUATE
TRICHROME RESULT:: NONE SEEN

## 2018-04-15 LAB — CLOSTRIDIUM DIFFICILE TOXIN B, QUALITATIVE, REAL-TIME PCR: Toxigenic C. Difficile by PCR: NOT DETECTED

## 2018-04-15 LAB — FECAL LACTOFERRIN, QUANT
Fecal Lactoferrin: POSITIVE — AB
MICRO NUMBER: 79722
SPECIMEN QUALITY:: ADEQUATE

## 2018-04-18 ENCOUNTER — Encounter: Payer: Self-pay | Admitting: Gastroenterology

## 2018-04-18 ENCOUNTER — Ambulatory Visit (INDEPENDENT_AMBULATORY_CARE_PROVIDER_SITE_OTHER): Payer: Medicare Other | Admitting: Gastroenterology

## 2018-04-18 VITALS — BP 132/80 | HR 96 | Ht 73.0 in | Wt 192.0 lb

## 2018-04-18 DIAGNOSIS — R1030 Lower abdominal pain, unspecified: Secondary | ICD-10-CM

## 2018-04-18 DIAGNOSIS — R197 Diarrhea, unspecified: Secondary | ICD-10-CM | POA: Diagnosis not present

## 2018-04-18 DIAGNOSIS — R634 Abnormal weight loss: Secondary | ICD-10-CM

## 2018-04-18 MED ORDER — VANCOMYCIN HCL 125 MG PO CAPS: ORAL_CAPSULE | ORAL | 0 refills | Status: DC

## 2018-04-18 NOTE — Patient Instructions (Signed)
If you are age 69 or older, your body mass index should be between 23-30. Your Body mass index is 25.33 kg/m. If this is out of the aforementioned range listed, please consider follow up with your Primary Care Provider.  If you are age 56 or younger, your body mass index should be between 19-25. Your Body mass index is 25.33 kg/m. If this is out of the aformentioned range listed, please consider follow up with your Primary Care Provider.   We have sent the following medications to your pharmacy for you to pick up at your convenience: vancomycin 125mg  take 2 caps 4 x a day.   Your provider has requested that you go to the basement level for lab work before leaving today. Press "B" on the elevator. The lab is located at the first door on the left as you exit the elevator.  It was a pleasure to see you today!  Dr. Loletha Carrow

## 2018-04-18 NOTE — Progress Notes (Signed)
[facility] Consult Note:  History: Frederick Guzman 04/18/2018  Referring physician: [name], DO  Reason for consult/chief complaint: Diarrhea (shoulder surgery on November 11th, has had loose stools since then. Constant gas pain. Has had several "accidents" Had some dental work done in December and January and was on antibiotics. )   Subjective  HPI:  This is a very pleasant 68 year old man referred by primary care for over 2 months of constant diarrhea.  He recalls that it began just after a shoulder surgery in early November.  He developed fairly sudden onset lower abdominal cramps with bloating and loose watery stool 4-6 times a day.  He has had some nocturnal diarrhea and several episodes of incontinence when he could not get to the bathroom on time.  He had antibiotics related to the shoulder surgery, and then more antibiotics with dental procedures in December and January.  He denies rectal bleeding, but is lost about 20 pounds over the last couple of months. He was seen at [facility] health urgent care last week for chronic diarrhea.  It was reportedly going on prior to multiple rounds of antibiotics after shoulder surgery and for dental procedures. Standard stool culture and ova and parasites negative, C. difficile PCR negative, however fecal lactoferrin positive. He had a colonoscopy with a GI physician in [facility] in 2016 for history of colon polyps, however that report is not currently available.  ROS:  Review of Systems  Constitutional: Positive for unexpected weight change. Negative for appetite change.  HENT: Negative for mouth sores and voice change.   Eyes: Negative for pain and redness.  Respiratory: Negative for cough and shortness of breath.   Cardiovascular: Negative for chest pain and palpitations.  Genitourinary: Negative for dysuria and hematuria.  Musculoskeletal: Positive for arthralgias. Negative for myalgias.  Skin: Negative  for pallor and rash.  Neurological: Negative for weakness and headaches.  Hematological: Negative for adenopathy.  All other systems reviewed and are negative.    Past Medical History: Past Medical History:  Diagnosis Date  . Arthritis    OA  . Asthma   . Cancer ([place]) 08/2014   melanoma on R shoulder & back - [facility]   . COPD (chronic obstructive pulmonary disease) ([place])   . Depression    pt. denies- 01/2016, pt on Wellbutrin for smoking cessation  . Diabetes mellitus without complication ([place])   . Diverticulitis   . Dyspnea    with exertion  . Fibromyalgia   . GERD (gastroesophageal reflux disease)   . Hyperlipemia   . Hypertension    "never was high"  . Pneumonia 06/2015, 10/2016  . Sleep apnea    last study - Nov. 2017, waiting for a new machine, using the old CPAP q night after cleaning the device after he was documented to have pneumonia from his CPAP machine.      Past Surgical History: Past Surgical History:  Procedure Laterality Date  . carpel tunnel Bilateral   . COLONOSCOPY W/ POLYPECTOMY    . COLONOSCOPY W/ POLYPECTOMY    . EXCISIONAL TOTAL SHOULDER ARTHROPLASTY WITH ANTIBIOTIC SPACER Left 08/09/2016   Procedure: LEFT SHOULDER HARDWARE REMOVAL, IRRIGATION AND DEBRIDEMENT, PLACEMENT OF ANTIBIOTIC SPACER;  Surgeon: Netta Cedars, MD;  Location: [facility];  Service: Orthopedics;  Laterality: Left;  . IRRIGATION AND DEBRIDEMENT SHOULDER Left 03/31/2016   Procedure: IRRIGATION AND DEBRIDEMENT SHOULDER, deep cultures and polyethylene exchange;  Surgeon: Netta Cedars, MD;  Location: [place];  Service: Orthopedics;  Laterality:  Left;  requests 25mins  . REVERSE SHOULDER ARTHROPLASTY Left 01/30/2016   Procedure: LEFT REVERSE SHOULDER ARTHROPLASTY;  Surgeon: Netta Cedars, MD;  Location: [place];  Service: Orthopedics;  Laterality: Left;  . ROTATOR CUFF REPAIR Left   . SHOULDER ARTHROSCOPY WITH DISTAL CLAVICLE RESECTION Right 01/30/2018   Procedure: RIGHT SHOULDER ARTHROSCOPY  WITH OPEN DISTAL CLAVICLE RESECTION, SUBACROMIAL DECOMPRESSION;  Surgeon: Netta Cedars, MD;  Location: [place];  Service: Orthopedics;  Laterality: Right;  . STERIOD INJECTION Right 01/30/2018   Procedure: STEROID INJECTION;  Surgeon: Netta Cedars, MD;  Location: [facility];  Service: Orthopedics;  Laterality: Right;  . TOTAL SHOULDER REVISION Left 01/17/2017   Procedure: LEFT SHOULDER REVISION REVERSE TOTAL SHOULDER ARTHROPLASTY;  Surgeon: Netta Cedars, MD;  Location: [place];  Service: Orthopedics;  Laterality: Left;  . WRIST SURGERY Right 11/2014   Dr. Amedeo Plenty     Family History: Family History  Problem Relation Age of Onset  . Diabetes Mother   . Colon cancer Father   . Prostate cancer Father   . Diabetes Father     Social History: Social History   Socioeconomic History  . Marital status: Divorced    Spouse name: Not on file  . Number of children: Not on file  . Years of education: Not on file  . Highest education level: Not on file  Occupational History  . Not on file  Social Needs  . Financial resource strain: Not on file  . Food insecurity:    Worry: Not on file    Inability: Not on file  . Transportation needs:    Medical: Not on file    Non-medical: Not on file  Tobacco Use  . Smoking status: Current Every Day Smoker    Packs/day: 2.00    Years: 42.00    Pack years: 84.00    Types: Cigarettes  . Smokeless tobacco: Never Used  . Tobacco comment: pt no longer taking chantix  Substance and Sexual Activity  . Alcohol use: Yes    Alcohol/week: 7.0 standard drinks    Types: 4 Cans of beer, 3 Shots of liquor per week    Comment: beer or mixed drink   . Drug use: No  . Sexual activity: Not on file  Lifestyle  . Physical activity:    Days per week: Not on file    Minutes per session: Not on file  . Stress: Not on file  Relationships  . Social connections:    Talks on phone: Not on file    Gets together: Not on file    Attends religious service: Not on file     Active member of club or organization: Not on file    Attends meetings of clubs or organizations: Not on file    Relationship status: Not on file  Other Topics Concern  . Not on file  Social History Narrative  . Not on file    Allergies: Allergies  Allergen Reactions  . Methotrexate Derivatives Nausea And Vomiting and Other (See Comments)    Hallucinations and aggressive behavior    . Tramadol Nausea And Vomiting    Hallucinations and aggressive behavior     Outpatient Meds: Current Outpatient Medications  Medication Sig Dispense Refill  . albuterol (PROAIR HFA) 108 (90 Base) MCG/ACT inhaler Inhale 2 puffs into the lungs every 6 (six) hours as needed for wheezing or shortness of breath.    Marland Kitchen albuterol (PROVENTIL) (2.5 MG/3ML) 0.083% nebulizer solution Take 2.5 mg by nebulization every 6 (six)  hours as needed for wheezing or shortness of breath.    Marland Kitchen aspirin EC 81 MG tablet Take 81 mg by mouth See admin instructions. Takes 1 tablet every other day at bedtime.    Marland Kitchen atorvastatin (LIPITOR) 20 MG tablet Take 1 tablet (20 mg total) by mouth every other day. (Patient taking differently: Take 20 mg by mouth See admin instructions. Takes one tablet every other day at bedtime.) 90 tablet 1  . b complex vitamins tablet Take 1 tablet by mouth daily.    . budesonide-formoterol (SYMBICORT) 160-4.5 MCG/ACT inhaler Inhale 2 puffs into the lungs 2 (two) times daily.    Marland Kitchen buPROPion (WELLBUTRIN XL) 300 MG 24 hr tablet Take 1 tablet (300 mg total) by mouth daily. 90 tablet 3  . cetirizine (ZYRTEC) 10 MG tablet Take 10 mg by mouth daily as needed for allergies.     . diphenoxylate-atropine (LOMOTIL) 2.5-0.025 MG tablet Take 1 tablet by mouth 4 (four) times daily as needed for diarrhea or loose stools. 15 tablet 1  . esomeprazole (NEXIUM) 40 MG capsule Take 1 capsule (40 mg total) by mouth daily. 90 capsule 1  . fluticasone (FLONASE) 50 MCG/ACT nasal spray Place 1 spray into both nostrils daily as needed  for allergies.  1  . glimepiride (AMARYL) 2 MG tablet Take 1 tablet (2 mg total) by mouth daily. 90 tablet 3  . glucose blood test strip Use up to 4 times per day as directed with glucometer. Disp: 100. Refill x99 100 each 99  . ketoconazole (NIZORAL) 2 % cream Apply 1 application topically daily as needed (redness).    Marland Kitchen lisinopril (PRINIVIL,ZESTRIL) 2.5 MG tablet Take 1 tablet (2.5 mg total) by mouth at bedtime. 90 tablet 3  . Multiple Vitamin (MULTIVITAMIN WITH MINERALS) TABS tablet Take 1 tablet by mouth at bedtime.    . Omega-3 Fatty Acids (FISH OIL) 1000 MG CAPS Take 1,000 mg by mouth at bedtime.     . Polyvinyl Alcohol-Povidone (REFRESH OP) Place 1 drop into both eyes daily as needed (dry eyes).    . tadalafil (CIALIS) 5 MG tablet TAKE 1 TABLET BY MOUTH ONCE DAILY 90 tablet 0  . tamsulosin (FLOMAX) 0.4 MG CAPS capsule Take 1 capsule (0.4 mg total) by mouth 2 (two) times daily. 60 capsule 3  . Testosterone 30 MG/ACT SOLN APPLY 1-2 PUMPS DAILY. ALTERNATE TAKING ONE PUMP UNDER ONE ARM ONE DAY, TWO PUMPS THE NEXT DAY (ONE PUMP UNDER EACH ARM) 90 mL 1  . tiotropium (SPIRIVA HANDIHALER) 18 MCG inhalation capsule Place 1 capsule (18 mcg total) into inhaler and inhale daily. 30 capsule 2  . vitamin C (ASCORBIC ACID) 500 MG tablet Take 500 mg by mouth daily.    . vancomycin (VANCOCIN) 125 MG capsule 250 mg 4 x a day. / 2 125 mg tabs 4 x a day for 14 days 112 capsule 0   No current facility-administered medications for this visit.       ___________________________________________________________________ Objective   Exam:  BP 132/80   Pulse 96   Ht 6\' 1"  (1.854 m)   Wt 192 lb (87.1 kg)   BMI 25.33 kg/m    General: this is a(n) well-appearing man, pleasant and conversational.  Not acutely ill-appearing  Eyes: sclera anicteric, no redness  ENT: oral mucosa moist without lesions, no cervical or supraclavicular lymphadenopathy  CV: RRR without murmur, S1/S2, no JVD, no peripheral  edema  Resp: clear to auscultation bilaterally, normal RR and effort noted  GI:  soft, mild lower tenderness, with active bowel sounds. No guarding or palpable organomegaly noted.  Skin; warm and dry, no rash or jaundice noted  Neuro: awake, alert and oriented x 3. Normal gross motor function and fluent speech  Labs:  Stool studies as noted above Prior colonoscopy report could not be located in care everywhere  Assessment: Encounter Diagnoses  Name Primary?  . Diarrhea of presumed infectious origin Yes  . Lower abdominal pain   . Abnormal loss of weight     This is infectious or inflammatory diarrhea, and I am most suspicious of C. difficile despite negative PCR testing.  Plan:  C. difficile PCR, toxin and antigen Begin vancomycin 250 mg, 1 tablet 4 times daily x14 days. If repeat stool testing negative and patient not improved on vancomycin, schedule colonoscopy.  Thank you for the courtesy of this consult.  Please call me with any questions or concerns.  Nelida Meuse III  CC: Referring provider noted above

## 2018-04-19 ENCOUNTER — Telehealth: Payer: Self-pay | Admitting: Gastroenterology

## 2018-04-19 ENCOUNTER — Other Ambulatory Visit: Payer: Self-pay

## 2018-04-19 DIAGNOSIS — R197 Diarrhea, unspecified: Secondary | ICD-10-CM

## 2018-04-19 NOTE — Telephone Encounter (Signed)
PT insurance [employer] called in requesting more clinical information for the pt medication auth. [id number] iis the number that was provided.

## 2018-04-19 NOTE — Telephone Encounter (Signed)
Working on the prior [place] with covermymeds. Pt has been notified and aware.

## 2018-04-19 NOTE — Telephone Encounter (Signed)
Pt called to inform that his insurance told him that medication that was prescribed yesterday was denied by his insurance because of the way that it was written. Insurance faxed over this information. Pt wants to know if we received it, pls call him.

## 2018-04-20 ENCOUNTER — Ambulatory Visit: Payer: Medicare Other | Admitting: Gastroenterology

## 2018-04-20 ENCOUNTER — Ambulatory Visit: Payer: Medicare Other | Admitting: Osteopathic Medicine

## 2018-04-20 MED ORDER — METRONIDAZOLE 500 MG PO TABS: 500.0000 mg | ORAL_TABLET | Freq: Three times a day (TID) | ORAL | 0 refills | Status: AC

## 2018-04-20 NOTE — Telephone Encounter (Addendum)
Spoke to [employer], Vancomycin pre certification has been denied. Patient can't afford the $500. Please advise.

## 2018-04-20 NOTE — Telephone Encounter (Signed)
Pt notified and aware. States clear understanding on the medication changes.

## 2018-04-20 NOTE — Telephone Encounter (Signed)
Pt requested an update on vancomycin.Frederick Guzman  Please CB.

## 2018-04-20 NOTE — Telephone Encounter (Signed)
Pt said he is calling you back from yesterday about his medication and asking to speak with you again  [phone]

## 2018-04-20 NOTE — Telephone Encounter (Signed)
I sent a rx for metronidazole 500 mg , one tablet 3 times daily for 14 days.  It is not first-line therapy if this turns out to be C. difficile, but we must start something if his insurance will not cover vancomycin.

## 2018-04-20 NOTE — Telephone Encounter (Signed)
Ins called about the med vancomycin [facility]) and advised that it was denied.. call back # for ins is [place] Beau [phone]. Also, advised that if he is prescribed for 250 mg, 240 capsule, 30 days... he wouldn't need Pre- authorization.

## 2018-04-20 NOTE — Addendum Note (Signed)
Addended by: Nelida Meuse on: 04/20/2018 04:50 PM   Modules accepted: Orders

## 2018-04-21 ENCOUNTER — Telehealth: Payer: Self-pay | Admitting: Gastroenterology

## 2018-04-21 NOTE — Telephone Encounter (Signed)
Spoke with pt and he is aware and states he started the metronidazole yesterday. Pt knows to call back with an update next week.

## 2018-04-21 NOTE — Telephone Encounter (Signed)
Left message for pt to call back  °

## 2018-04-21 NOTE — Telephone Encounter (Signed)
Frederick Guzman,  Please let him know that the repeat stool study was negative for C. Difficile. However, I still think this seems most like an infectious diarrhea, so I would like him to take the metronidazole as prescribed. Please contact us next Thursday or Friday with an update on symptoms.  If he is not improved at that time, let me know and we will plan for a colonoscopy.

## 2018-04-22 DIAGNOSIS — K52832 Lymphocytic colitis: Secondary | ICD-10-CM

## 2018-04-22 HISTORY — DX: Lymphocytic colitis: K52.832

## 2018-04-27 ENCOUNTER — Telehealth: Payer: Self-pay | Admitting: Gastroenterology

## 2018-04-27 NOTE — Telephone Encounter (Signed)
Pt has 14 days worth of med and he knows to call back next week with update.

## 2018-04-27 NOTE — Telephone Encounter (Signed)
Pt calling back with a symptom update. States he is some better, he is able to sleep through the night and is not having to get up to have a BM at night. The odor has improved some and the stool is quite soft but not diarrhea. Pt is still taking the metronidazole.

## 2018-04-27 NOTE — Telephone Encounter (Signed)
Glad to hear it. Please make sure he has enough metronidazole for a total 14 day course.  Call us mid next week with update.

## 2018-05-04 NOTE — Telephone Encounter (Signed)
Left message on machine to call back  

## 2018-05-04 NOTE — Telephone Encounter (Signed)
Pt states his diarrhea has improved a little but continues to have 2-4 loose stools.  NO blood, NO abd pain.  The pt has 1 pill left of flagyl.  Please advise.

## 2018-05-04 NOTE — Telephone Encounter (Signed)
PT states that he needs to call ever week to update linda about his symptoms

## 2018-05-04 NOTE — Telephone Encounter (Signed)
The pt was contacted and scheduled for 2/20 and previsit for 2/14

## 2018-05-04 NOTE — Telephone Encounter (Signed)
Please have him come for a nurse visit 2/14 to be set up for a colonoscopy with me next week.   I have two early afternoon slots open on Feb 20th

## 2018-05-05 ENCOUNTER — Ambulatory Visit (AMBULATORY_SURGERY_CENTER): Payer: Self-pay

## 2018-05-05 VITALS — Ht 73.0 in | Wt 195.4 lb

## 2018-05-05 DIAGNOSIS — R197 Diarrhea, unspecified: Secondary | ICD-10-CM

## 2018-05-05 MED ORDER — NA SULFATE-K SULFATE-MG SULF 17.5-3.13-1.6 GM/177ML PO SOLN: 1.0000 | Freq: Once | ORAL | 0 refills | Status: AC

## 2018-05-05 NOTE — Progress Notes (Signed)
Denies allergies to eggs or soy products. Denies complication of anesthesia or sedation. Denies use of weight loss medication. Denies use of O2.   Emmi instructions declined.  

## 2018-05-08 ENCOUNTER — Other Ambulatory Visit: Payer: Self-pay | Admitting: Osteopathic Medicine

## 2018-05-11 ENCOUNTER — Encounter: Payer: Self-pay | Admitting: Gastroenterology

## 2018-05-11 ENCOUNTER — Ambulatory Visit (AMBULATORY_SURGERY_CENTER): Payer: Medicare Other | Admitting: Gastroenterology

## 2018-05-11 VITALS — BP 114/61 | HR 80 | Temp 97.5°F | Resp 15 | Ht 73.0 in | Wt 192.0 lb

## 2018-05-11 DIAGNOSIS — D122 Benign neoplasm of ascending colon: Secondary | ICD-10-CM

## 2018-05-11 DIAGNOSIS — D120 Benign neoplasm of cecum: Secondary | ICD-10-CM | POA: Diagnosis not present

## 2018-05-11 DIAGNOSIS — K52832 Lymphocytic colitis: Secondary | ICD-10-CM

## 2018-05-11 DIAGNOSIS — R197 Diarrhea, unspecified: Secondary | ICD-10-CM

## 2018-05-11 DIAGNOSIS — K529 Noninfective gastroenteritis and colitis, unspecified: Secondary | ICD-10-CM

## 2018-05-11 MED ORDER — SODIUM CHLORIDE 0.9 % IV SOLN: 500.0000 mL | Freq: Once | INTRAVENOUS | Status: DC

## 2018-05-11 NOTE — Patient Instructions (Signed)
YOU HAD AN ENDOSCOPIC PROCEDURE TODAY AT THE [facility]:   Refer to the procedure report that was given to you for any specific questions about what was found during the examination.  If the procedure report does not answer your questions, please call your gastroenterologist to clarify.  If you requested that your care partner not be given the details of your procedure findings, then the procedure report has been included in a sealed envelope for you to review at your convenience later.  YOU SHOULD EXPECT: Some feelings of bloating in the abdomen. Passage of more gas than usual.  Walking can help get rid of the air that was put into your GI tract during the procedure and reduce the bloating. If you had a lower endoscopy (such as a colonoscopy or flexible sigmoidoscopy) you may notice spotting of blood in your stool or on the toilet paper. If you underwent a bowel prep for your procedure, you may not have a normal bowel movement for a few days.  Please Note:  You might notice some irritation and congestion in your nose or some drainage.  This is from the oxygen used during your procedure.  There is no need for concern and it should clear up in a day or so.  SYMPTOMS TO REPORT IMMEDIATELY:   Following lower endoscopy (colonoscopy or flexible sigmoidoscopy):  Excessive amounts of blood in the stool  Significant tenderness or worsening of abdominal pains  Swelling of the abdomen that is new, acute  Fever of 100F or higher  For urgent or emergent issues, a gastroenterologist can be reached at any hour by calling [phone].   DIET:  We do recommend a small meal at first, but then you may proceed to your regular diet.  Drink plenty of fluids but you should avoid alcoholic beverages for 24 hours.  MEDICATIONS: Continue present medications.  Please see handouts given to you by your recovery nurse.  ACTIVITY:  You should plan to take it easy for the rest of today and you should NOT  DRIVE or use heavy machinery until tomorrow (because of the sedation medicines used during the test).    FOLLOW UP: Our staff will call the number listed on your records the next business day following your procedure to check on you and address any questions or concerns that you may have regarding the information given to you following your procedure. If we do not reach you, we will leave a message.  However, if you are feeling well and you are not experiencing any problems, there is no need to return our call.  We will assume that you have returned to your regular daily activities without incident.  If any biopsies were taken you will be contacted by phone or by letter within the next 1-3 weeks.  Please call us at [phone] if you have not heard about the biopsies in 3 weeks.   Thank you for allowing us to provide for your healthcare needs today.   SIGNATURES/CONFIDENTIALITY: You and/or your care partner have signed paperwork which will be entered into your electronic medical record.  These signatures attest to the fact that that the information above on your After Visit Summary has been reviewed and is understood.  Full responsibility of the confidentiality of this discharge information lies with you and/or your care-partner. 

## 2018-05-11 NOTE — Op Note (Signed)
[place] Patient Name: Frederick Guzman Procedure Date: 05/11/2018 12:40 PM MRN: [id number] Endoscopist: Mallie Mussel L. Loletha Carrow , MD Age: 68 Referring MD:  Date of Birth: [birth year] Gender: Male Account #: [id number] Procedure:                Colonoscopy Indications:              Chronic diarrhea (negative stool studies, little                            improvement with course of metronidazole) Medicines:                Monitored Anesthesia Care Procedure:                Pre-Anesthesia Assessment:                           - Prior to the procedure, a History and Physical                            was performed, and patient medications and                            allergies were reviewed. The patient's tolerance of                            previous anesthesia was also reviewed. The risks                            and benefits of the procedure and the sedation                            options and risks were discussed with the patient.                            All questions were answered, and informed consent                            was obtained. Anticoagulants: The patient has taken                            aspirin. It was decided not to withhold this                            medication prior to the procedure. ASA Grade                            Assessment: III - A patient with severe systemic                            disease. After reviewing the risks and benefits,                            the patient was deemed in satisfactory condition to  undergo the procedure.                           After obtaining informed consent, the colonoscope                            was passed under direct vision. Throughout the                            procedure, the patient's blood pressure, pulse, and                            oxygen saturations were monitored continuously. The                            Colonoscope was introduced through the  anus and                            advanced to the the terminal ileum, with                            identification of the appendiceal orifice and IC                            valve. The colonoscopy was performed without                            difficulty. The patient tolerated the procedure                            well. The quality of the bowel preparation was                            excellent. The terminal ileum, ileocecal valve,                            appendiceal orifice, and rectum were photographed. Scope In: 1:35:29 PM Scope Out: 1:54:11 PM Scope Withdrawal Time: 0 hours 14 minutes 8 seconds  Total Procedure Duration: 0 hours 18 minutes 42 seconds  Findings:                 The perianal and digital rectal examinations were                            normal.                           The terminal ileum appeared normal.                           Two sessile polyps were found in the ascending                            colon and cecum. The polyps were diminutive in  size. These polyps were removed with a cold snare.                            Resection and retrieval were complete.                           Multiple diverticula were found in the left colon                            with mild patchy associated erythema.                           Normal mucosa was found in the remainder of the                            colon. Biopsies for histology were taken with a                            cold forceps from the right colon and left colon                            for evaluation of microscopic colitis.                           The exam was otherwise without abnormality on                            direct and retroflexion views. Complications:            No immediate complications. Estimated Blood Loss:     Estimated blood loss was minimal. Impression:               - The examined portion of the ileum was normal.                            - Two diminutive polyps in the ascending colon and                            in the cecum, removed with a cold snare. Resected                            and retrieved.                           - Diverticulosis in the left colon.                           - Colon mucosa biopsied to rule out microscopic                            colitis.                           - The examination was otherwise normal on direct  and retroflexion views. Recommendation:           - Patient has a contact number available for                            emergencies. The signs and symptoms of potential                            delayed complications were discussed with the                            patient. Return to normal activities tomorrow.                            Written discharge instructions were provided to the                            patient.                           - Resume previous diet.                           - Continue present medications.                           - Await pathology results.                           - Repeat colonoscopy is recommended for                            surveillance. The colonoscopy date will be                            determined after pathology results from today's                            exam become available for review.  L. Loletha Carrow, MD 05/11/2018 2:01:22 PM This report has been signed electronically.

## 2018-05-11 NOTE — Progress Notes (Signed)
Pt's states no medical or surgical changes since previsit or office visit. 

## 2018-05-11 NOTE — Progress Notes (Signed)
PT taken to PACU. Monitors in place. VSS. Report given to RN. 

## 2018-05-12 ENCOUNTER — Telehealth: Payer: Self-pay

## 2018-05-12 NOTE — Telephone Encounter (Signed)
  Follow up Call-  Call back number 05/11/2018  Post procedure Call Back phone  # [phone]  Permission to leave phone message Yes  Some recent data might be hidden     Patient questions:  Do you have a fever, pain , or abdominal swelling? No. Pain Score  0 *  Have you tolerated food without any problems? Yes.    Have you been able to return to your normal activities? Yes.    Do you have any questions about your discharge instructions: Diet   No. Medications  No. Follow up visit  No.  Do you have questions or concerns about your Care? No.  Pt. Reports he is still having diarrhea (as he did prior to his procedure).  Told pt. He would receive a letter re: biopsy results, or a phone call after results reviewed re: moving forward with his care.  Actions: * If pain score is 4 or above: No action needed, pain <4.

## 2018-05-16 ENCOUNTER — Encounter: Payer: Self-pay | Admitting: Gastroenterology

## 2018-05-16 ENCOUNTER — Other Ambulatory Visit: Payer: Self-pay

## 2018-05-16 MED ORDER — BUDESONIDE 3 MG PO CPEP: 9.0000 mg | ORAL_CAPSULE | Freq: Every day | ORAL | 2 refills | Status: DC

## 2018-06-14 ENCOUNTER — Telehealth (INDEPENDENT_AMBULATORY_CARE_PROVIDER_SITE_OTHER): Payer: Medicare Other | Admitting: Gastroenterology

## 2018-06-14 ENCOUNTER — Other Ambulatory Visit: Payer: Self-pay

## 2018-06-14 DIAGNOSIS — K529 Noninfective gastroenteritis and colitis, unspecified: Secondary | ICD-10-CM | POA: Diagnosis not present

## 2018-06-14 DIAGNOSIS — Z79899 Other long term (current) drug therapy: Secondary | ICD-10-CM | POA: Diagnosis not present

## 2018-06-14 DIAGNOSIS — K52832 Lymphocytic colitis: Secondary | ICD-10-CM

## 2018-06-14 DIAGNOSIS — R198 Other specified symptoms and signs involving the digestive system and abdomen: Secondary | ICD-10-CM

## 2018-06-14 NOTE — Progress Notes (Signed)
This patient contacted our office requesting a physician telemedicine video consultation regarding clinical questions and/or test results.   Participants on the phone call myself and the patient and his wife  The patient consented to phone consultation and was aware that a charge will be placed through their insurance.  I was in my office and the patient was on his phone at home   Total encounter time: 16 minutes   Wilfrid Lund, MD    _________________________________________           [facility] Progress Note  Chief Complaint: Chronic diarrhea  Subjective  History:  Several months of intractable diarrhea that began after hospitalization for shoulder surgery and multiple rounds of antibiotics.  C. difficile testing was negative, but I gave him empiric therapy with metronidazole (vancomycin denied).  Unfortunately, no improvement with metronidazole.  Colonoscopy revealed 2 tubular adenomas and lymphocytic colitis.  He was started on Entocort 9 mg daily about 4 weeks ago. Better , still with few loose stools/day. Usually all done by late AM.  No more nocturnal diarrhea, some mucus. Urgency, sometimes with little output.  No bleeding. Occ'l imodium use.  Appetite good, weight stable.  ROS: Cardiovascular:  no chest pain Respiratory: no dyspnea  The patient's Past Medical, Family and Social History were reviewed and are on file in the EMR.  Objective:  Med list reviewed  Current Outpatient Medications:  .  albuterol (PROAIR HFA) 108 (90 Base) MCG/ACT inhaler, Inhale 2 puffs into the lungs every 6 (six) hours as needed for wheezing or shortness of breath., Disp: , Rfl:  .  albuterol (PROVENTIL) (2.5 MG/3ML) 0.083% nebulizer solution, Take 2.5 mg by nebulization every 6 (six) hours as needed for wheezing or shortness of breath., Disp: , Rfl:  .  aspirin EC 81 MG tablet, Take 81 mg by mouth See admin instructions. Takes 1 tablet every other day at bedtime., Disp: , Rfl:  .   atorvastatin (LIPITOR) 20 MG tablet, Take 1 tablet (20 mg total) by mouth every other day. (Patient taking differently: Take 20 mg by mouth See admin instructions. Takes one tablet every other day at bedtime.), Disp: 90 tablet, Rfl: 1 .  b complex vitamins tablet, Take 1 tablet by mouth daily., Disp: , Rfl:  .  budesonide (ENTOCORT EC) 3 MG 24 hr capsule, Take 3 capsules (9 mg total) by mouth daily., Disp: 90 capsule, Rfl: 2 .  budesonide-formoterol (SYMBICORT) 160-4.5 MCG/ACT inhaler, Inhale 2 puffs into the lungs 2 (two) times daily., Disp: , Rfl:  .  buPROPion (WELLBUTRIN XL) 300 MG 24 hr tablet, Take 1 tablet (300 mg total) by mouth daily., Disp: 90 tablet, Rfl: 3 .  cetirizine (ZYRTEC) 10 MG tablet, Take 10 mg by mouth daily as needed for allergies. , Disp: , Rfl:  .  diphenoxylate-atropine (LOMOTIL) 2.5-0.025 MG tablet, Take 1 tablet by mouth 4 (four) times daily as needed for diarrhea or loose stools., Disp: 15 tablet, Rfl: 1 .  esomeprazole (NEXIUM) 40 MG capsule, Take 1 capsule (40 mg total) by mouth daily., Disp: 90 capsule, Rfl: 1 .  fluticasone (FLONASE) 50 MCG/ACT nasal spray, Place 1 spray into both nostrils daily as needed for allergies., Disp: , Rfl: 1 .  glimepiride (AMARYL) 2 MG tablet, Take 1 tablet (2 mg total) by mouth daily., Disp: 90 tablet, Rfl: 3 .  glucose blood test strip, Use up to 4 times per day as directed with glucometer. Disp: 100. Refill x99, Disp: 100 each, Rfl: 99 .  ketoconazole (NIZORAL) 2 % cream, Apply 1 application topically daily as needed (redness)., Disp: , Rfl:  .  lisinopril (PRINIVIL,ZESTRIL) 2.5 MG tablet, Take 1 tablet (2.5 mg total) by mouth at bedtime., Disp: 90 tablet, Rfl: 3 .  Multiple Vitamin (MULTIVITAMIN WITH MINERALS) TABS tablet, Take 1 tablet by mouth at bedtime., Disp: , Rfl:  .  Omega-3 Fatty Acids (FISH OIL) 1000 MG CAPS, Take 1,000 mg by mouth at bedtime. , Disp: , Rfl:  .  Polyvinyl Alcohol-Povidone (REFRESH OP), Place 1 drop into both  eyes daily as needed (dry eyes)., Disp: , Rfl:  .  tadalafil (CIALIS) 5 MG tablet, TAKE 1 TABLET BY MOUTH ONCE DAILY, Disp: 90 tablet, Rfl: 0 .  tamsulosin (FLOMAX) 0.4 MG CAPS capsule, Take 1 capsule (0.4 mg total) by mouth 2 (two) times daily., Disp: 60 capsule, Rfl: 3 .  Testosterone 30 MG/ACT SOLN, APPLY 1-2 PUMPS DAILY. ALTERNATE TAKING ONE PUMP UNDER ONE ARM ONE DAY, TWO PUMPS THE NEXT DAY (ONE PUMP UNDER EACH ARM), Disp: 90 mL, Rfl: 1 .  tiotropium (SPIRIVA HANDIHALER) 18 MCG inhalation capsule, Place 1 capsule (18 mcg total) into inhaler and inhale daily., Disp: 30 capsule, Rfl: 2 .  vitamin C (ASCORBIC ACID) 500 MG tablet, Take 500 mg by mouth daily., Disp: , Rfl:   Current Facility-Administered Medications:  .  0.9 %  sodium chloride infusion, 500 mL, Intravenous, Once, Danis, Kirke Corin, MD    @ASSESSMENTPLANBEGIN @ Assessment: Encounter Diagnoses  Name Primary?  . Lymphocytic colitis Yes  . Chronic diarrhea   . Tenesmus   We discussed the uncertain nature of this condition.  He does take PPI regularly, but says he cannot stop it due to severe heartburn at night if he goes without the medicine.  He takes aspirin every other day, no history of MI or CVA.  I recommended he stop it, and he otherwise has rare NSAID use.   Continue current dose of medication, encouraged him to take the Lomotil a little more often to reduce diarrhea and urgency.   Plan: Contact him for an appointment in about 6 weeks, assuming current social distancing actions are lifted by then. Nelida Meuse III

## 2018-06-25 NOTE — Progress Notes (Addendum)
Subjective:   Frederick Guzman is a 68 y.o. male who presents for Medicare Annual/Subsequent preventive examination.  Review of Systems:  No ROS.  Medicare Wellness Visit. Additional risk factors are reflected in the social history.  Cardiac Risk Factors include: advanced age (>35mn, >>65women);hypertension;dyslipidemia;male gender;smoking/ tobacco exposure Sleep patterns: Getting at least 8 hours of sleep at night. Wakes up 1-2 times a night. Wakes up and feels rested in the mornings Home Safety/Smoke Alarms: Feels safe in home. Smoke alarms in place.  Living environment; Lives with partner in 2 story home. Stairs have handrails in [[place] Shower is a walk in shower with grab bars in place. Advised to also put phone in bathroom in case fall happens can easily get to the phone. Seat Belt Safety/Bike Helmet: Wears seat belt.  Male:   CCS-    utd PSA- PSA done on 04/10/18-  2.6     Objective:    Vitals: BP (!) 146/85   Pulse 90   Ht '6\' 1"'  (1.854 m)   Wt 184 lb (83.5 kg)   BMI 24.28 kg/m   Body mass index is 24.28 kg/m.  Advanced Directives 06/26/2018 01/30/2018 10/23/2017 10/22/2017 01/12/2017 09/21/2016 08/09/2016  Does Patient Have a Medical Advance Directive? No No No No No No Yes  Type of Advance Directive - - - - - - HPress photographerLiving will  Does patient want to make changes to medical advance directive? - - - - - - No - Patient declined  Would patient like information on creating a medical advance directive? No - Patient declined No - Patient declined No - Patient declined - No - Patient declined No - Patient declined -    Tobacco Social History   Tobacco Use  Smoking Status Current Every Day Smoker  . Packs/day: 1.50  . Years: 43.00  . Pack years: 64.50  . Types: Cigarettes  Smokeless Tobacco Never Used  Tobacco Comment   pt no longer taking chantix     Ready to quit: No Counseling given: Not Answered Comment: pt no longer taking  chantix   Clinical Intake:  Pre-visit preparation completed: Yes  Pain : No/denies pain     Nutritional Risks: None Diabetes: No  How often do you need to have someone help you when you read instructions, pamphlets, or other written materials from your doctor or pharmacy?: 1 - Never What is the last grade level you completed in school?: 12  Interpreter Needed?: No  Information entered by :: KOrlie Dakin LPN  Past Medical History:  Diagnosis Date  . Allergy   . Arthritis    OA  . Asthma   . [place]   melanoma on R shoulder & back - [facility]   . COPD (chronic obstructive pulmonary disease) [place]   . Depression    pt. denies- 01/2016, pt on Wellbutrin for smoking cessation  . Diabetes mellitus without complication ([place]   . Diverticulitis   . Dyspnea    with exertion  . Fibromyalgia   . GERD (gastroesophageal reflux disease)   . Hyperlipemia   . Hypertension    "never was high"  . Lymphocytic colitis 04/2018  . Pneumonia 06/2015, 10/2016  . Sleep apnea    last study - Nov. 2017, waiting for a new machine, using the old CPAP q night after cleaning the device after he was documented to have pneumonia from his CPAP machine.    Past Surgical History:  Procedure Laterality Date  .  carpel tunnel Bilateral   . COLONOSCOPY W/ POLYPECTOMY    . COLONOSCOPY W/ POLYPECTOMY    . EXCISIONAL TOTAL SHOULDER ARTHROPLASTY WITH ANTIBIOTIC SPACER Left 08/09/2016   Procedure: LEFT SHOULDER HARDWARE REMOVAL, IRRIGATION AND DEBRIDEMENT, PLACEMENT OF ANTIBIOTIC SPACER;  Surgeon: Netta Cedars, MD;  Location: [place];  Service: Orthopedics;  Laterality: Left;  . IRRIGATION AND DEBRIDEMENT SHOULDER Left 03/31/2016   Procedure: IRRIGATION AND DEBRIDEMENT SHOULDER, deep cultures and polyethylene exchange;  Surgeon: Netta Cedars, MD;  Location: [place];  Service: Orthopedics;  Laterality: Left;  requests 72mns  . REVERSE SHOULDER ARTHROPLASTY Left 01/30/2016   Procedure: LEFT REVERSE  SHOULDER ARTHROPLASTY;  Surgeon: SNetta Cedars MD;  [place]  Service: Orthopedics;  Laterality: Left;  . ROTATOR CUFF REPAIR Left   . SHOULDER ARTHROSCOPY WITH DISTAL CLAVICLE RESECTION Right 01/30/2018   Procedure: RIGHT SHOULDER ARTHROSCOPY WITH OPEN DISTAL CLAVICLE RESECTION, SUBACROMIAL DECOMPRESSION;  Surgeon: NNetta Cedars MD;  [place]  Service: Orthopedics;  Laterality: Right;  . STERIOD INJECTION Right 01/30/2018   Procedure: STEROID INJECTION;  Surgeon: NNetta Cedars MD;  [place]  Service: Orthopedics;  Laterality: Right;  . TOTAL SHOULDER REVISION Left 01/17/2017   Procedure: LEFT SHOULDER REVISION REVERSE TOTAL SHOULDER ARTHROPLASTY;  Surgeon: NNetta Cedars MD;  [place]  Service: Orthopedics;  Laterality: Left;  . WRIST SURGERY Right 11/2014   Dr. GAmedeo Plenty  Family History  Problem Relation Age of Onset  . Diabetes Mother   . Colon cancer Father   . Prostate cancer Father   . Diabetes Father   . Esophageal cancer Neg Hx   . Rectal cancer Neg Hx   . Stomach cancer Neg Hx    Social History   Socioeconomic History  . Marital status: Single    Spouse name: AWebb Silversmith . Number of children: 2  . Years of education: 173 . Highest education level: 12th grade  Occupational History  . Occupation: lineman    Comment: retired  SScientific laboratory technician . Financial resource strain: Not hard at all  . Food insecurity:    Worry: Never true    Inability: Never true  . Transportation needs:    Medical: No    Non-medical: No  Tobacco Use  . Smoking status: Current Every Day Smoker    Packs/day: 1.50    Years: 43.00    Pack years: 64.50    Types: Cigarettes  . Smokeless tobacco: Never Used  . Tobacco comment: pt no longer taking chantix  Substance and Sexual Activity  . Alcohol use: Yes    Alcohol/week: 7.0 standard drinks    Types: 4 Cans of beer, 3 Shots of liquor per week    Comment: beer or mixed drink   . Drug use: No  . Sexual activity: Yes   Lifestyle  . Physical activity:    Days per week: 0 days    Minutes per session: 0 min  . Stress: Not at all  Relationships  . Social connections:    Talks on phone: More than three times a week    Gets together: Three times a week    Attends religious service: More than 4 times per year    Active member of club or organization: Yes    Attends meetings of clubs or organizations: 1 to 4 times per year    Relationship status: Living with partner  Other Topics Concern  . Not on file  Social History Narrative   Retired. DOes a  lot of yard work and handy work. Members of the camping club.    Outpatient Encounter Medications as of 06/26/2018  Medication Sig  . albuterol (PROAIR HFA) 108 (90 Base) MCG/ACT inhaler Inhale 2 puffs into the lungs every 6 (six) hours as needed for wheezing or shortness of breath.  Marland Kitchen albuterol (PROVENTIL) (2.5 MG/3ML) 0.083% nebulizer solution Take 2.5 mg by nebulization every 6 (six) hours as needed for wheezing or shortness of breath.  Marland Kitchen atorvastatin (LIPITOR) 20 MG tablet Take 1 tablet (20 mg total) by mouth every other day. (Patient taking differently: Take 20 mg by mouth See admin instructions. Takes one tablet every other day at bedtime.)  . b complex vitamins tablet Take 1 tablet by mouth daily.  . budesonide (ENTOCORT EC) 3 MG 24 hr capsule Take 3 capsules (9 mg total) by mouth daily.  . budesonide-formoterol (SYMBICORT) 160-4.5 MCG/ACT inhaler Inhale 2 puffs into the lungs 2 (two) times daily.  Marland Kitchen buPROPion (WELLBUTRIN XL) 300 MG 24 hr tablet Take 1 tablet (300 mg total) by mouth daily.  . cetirizine (ZYRTEC) 10 MG tablet Take 10 mg by mouth daily as needed for allergies.   . diphenoxylate-atropine (LOMOTIL) 2.5-0.025 MG tablet Take 1 tablet by mouth 4 (four) times daily as needed for diarrhea or loose stools.  Marland Kitchen esomeprazole (NEXIUM) 40 MG capsule Take 1 capsule (40 mg total) by mouth daily.  . fluticasone (FLONASE) 50 MCG/ACT nasal spray Place 1 spray  into both nostrils daily as needed for allergies.  Marland Kitchen glimepiride (AMARYL) 2 MG tablet Take 1 tablet (2 mg total) by mouth daily.  Marland Kitchen glucose blood test strip Use up to 4 times per day as directed with glucometer. Disp: 100. Refill x99  . ketoconazole (NIZORAL) 2 % cream Apply 1 application topically daily as needed (redness).  Marland Kitchen lisinopril (PRINIVIL,ZESTRIL) 2.5 MG tablet Take 1 tablet (2.5 mg total) by mouth at bedtime.  . Multiple Vitamin (MULTIVITAMIN WITH MINERALS) TABS tablet Take 1 tablet by mouth at bedtime.  . Omega-3 Fatty Acids (FISH OIL) 1000 MG CAPS Take 1,000 mg by mouth at bedtime.   . Polyvinyl Alcohol-Povidone (REFRESH OP) Place 1 drop into both eyes daily as needed (dry eyes).  . tadalafil (CIALIS) 5 MG tablet TAKE 1 TABLET BY MOUTH ONCE DAILY  . tamsulosin (FLOMAX) 0.4 MG CAPS capsule Take 1 capsule (0.4 mg total) by mouth 2 (two) times daily.  . Testosterone 30 MG/ACT SOLN APPLY 1-2 PUMPS DAILY. ALTERNATE TAKING ONE PUMP UNDER ONE ARM ONE DAY, TWO PUMPS THE NEXT DAY (ONE PUMP UNDER EACH ARM)  . tiotropium (SPIRIVA HANDIHALER) 18 MCG inhalation capsule Place 1 capsule (18 mcg total) into inhaler and inhale daily.  . vitamin C (ASCORBIC ACID) 500 MG tablet Take 500 mg by mouth daily.  Manus Gunning BOWEL PREP KIT 17.5-3.13-1.6 GM/177ML SOLN Take 1 kit by mouth once for 1 dose. Suprep as directed. No substitutions.  . [DISCONTINUED] aspirin EC 81 MG tablet Take 81 mg by mouth See admin instructions. Takes 1 tablet every other day at bedtime.   Facility-Administered Encounter Medications as of 06/26/2018  Medication  . 0.9 %  sodium chloride infusion    Activities of Daily Living In your present state of health, do you have any difficulty performing the following activities: 06/26/2018 01/30/2018  Hearing? N N  Vision? N N  Difficulty concentrating or making decisions? N N  Walking or climbing stairs? N N  Dressing or bathing? N N  Doing errands, shopping? N -  Preparing Food and  eating ? N -  Using the Toilet? N -  In the past six months, have you accidently leaked urine? N -  Do you have problems with loss of bowel control? Y -  Comment due to colitis- on meds for this -  Managing your Medications? N -  Managing your Finances? N -  Housekeeping or managing your Housekeeping? N -  Some recent data might be hidden    Patient Care Team: [name], DO as PCP - General (Osteopathic Medicine)   Assessment:   This is a routine wellness examination for Frederick Guzman.Physical assessment deferred to PCP.   Exercise Activities and Dietary recommendations Current Exercise Habits: The patient does not participate in regular exercise at present, Exercise limited by: respiratory conditions(s) Diet Eats a healthy diet. Advised to at least get 4 servings of dairy daily or take supplement Breakfast: biscuit and gravy, shredded wheat Lunch: sandwich Dinner: meat and vegetables. Advised on low carb diet to help with sugars.     Goals    . DIET - INCREASE WATER INTAKE     Increase water intake to 32 ounces daily. Start walking to get daily exercise.       Fall Risk Fall Risk  06/26/2018 01/03/2017 09/20/2016  Falls in the past year? 0 - No  Number falls in past yr: - 1 -  Injury with Fall? - Yes -  Risk for fall due to : - History of fall(s) -  Follow up Falls prevention discussed - -   Is the patient's home free of loose throw rugs in walkways, pet beds, electrical cords, etc?   yes      Grab bars in the bathroom? yes      Handrails on the stairs?   yes      Adequate lighting?   yes   Depression Screen PHQ 2/9 Scores 06/26/2018 10/17/2017 01/03/2017 09/21/2016  PHQ - 2 Score 0 '1 1 5  ' PHQ- 9 Score - 4 - 21    Cognitive Function     6CIT Screen 06/26/2018  What Year? 0 points  What month? 0 points  What time? 0 points  Count back from 20 0 points  Months in reverse 0 points  Repeat phrase 0 points  Total Score 0    Immunization History  Administered Date(s)  Administered  . Influenza, High Dose Seasonal PF 12/15/2016, 01/19/2018  . Influenza, Seasonal, Injecte, Preservative Fre 11/27/2015  . Influenza-Unspecified 02/14/2012, 12/27/2012  . Pneumococcal Conjugate-13 03/18/2015  . Pneumococcal Polysaccharide-23 01/19/2008, 12/15/2016  . Tdap 05/31/2011    Screening Tests Health Maintenance  Topic Date Due  . Hepatitis C Screening  Dec 30, 1950  . FOOT EXAM  07/29/1959  . OPHTHALMOLOGY EXAM  04/09/2018  . HEMOGLOBIN A1C  10/09/2018  . INFLUENZA VACCINE  10/21/2018  . TETANUS/TDAP  05/30/2021  . COLONOSCOPY  05/11/2025  . PNA vac Low Risk Adult  Completed        Plan:    Please schedule your next medicare wellness visit with me in 1 yr.  Frederick Guzman , Thank you for taking time to come for your Medicare Wellness Visit. I appreciate your ongoing commitment to your health goals. Please review the following plan we discussed and let me know if I can assist you in the future.   Bring a copy of your living will and/or healthcare power of attorney to your next office visit.   These are the goals we discussed: Goals    .  DIET - INCREASE WATER INTAKE     Increase water intake to 32 ounces daily. Start walking to get daily exercise.       This is a list of the screening recommended for you and due dates:  Health Maintenance  Topic Date Due  .  Hepatitis C: One time screening is recommended by Center for Disease Control  (CDC) for  adults born from [birth year] through 1966.   10-09-1950  . Complete foot exam   07/29/1959  . Eye exam for diabetics  04/09/2018  . Hemoglobin A1C  10/09/2018  . Flu Shot  10/21/2018  . Tetanus Vaccine  05/30/2021  . Colon Cancer Screening  05/11/2025  . Pneumonia vaccines  Completed     I have personally reviewed and noted the following in the patient's chart:   . Medical and social history . Use of alcohol, tobacco or illicit drugs  . Current medications and supplements . Functional ability and  status . Nutritional status . Physical activity . Advanced directives . List of other physicians . Hospitalizations, surgeries, and ER visits in previous 12 months . Vitals . Screenings to include cognitive, depression, and falls . Referrals and appointments  In addition, I have reviewed and discussed with patient certain preventive protocols, quality metrics, and best practice recommendations. A written personalized care plan for preventive services as well as general preventive health recommendations were provided to patient.     Joanne Chars, LPN  07/28/4705    Medical screening examination/treatment was performed by qualified clinical staff member and as supervising physician I was immediately available for consultation/collaboration. I have reviewed documentation and agree with assessment and plan.  [name], DO

## 2018-06-26 ENCOUNTER — Telehealth: Payer: Self-pay

## 2018-06-26 ENCOUNTER — Other Ambulatory Visit: Payer: Self-pay

## 2018-06-26 ENCOUNTER — Telehealth: Payer: Self-pay | Admitting: Osteopathic Medicine

## 2018-06-26 ENCOUNTER — Ambulatory Visit (INDEPENDENT_AMBULATORY_CARE_PROVIDER_SITE_OTHER): Payer: Medicare Other | Admitting: *Deleted

## 2018-06-26 VITALS — BP 146/85 | HR 90 | Ht 73.0 in | Wt 184.0 lb

## 2018-06-26 DIAGNOSIS — Z0000 Encounter for general adult medical examination without abnormal findings: Secondary | ICD-10-CM

## 2018-06-26 NOTE — Patient Instructions (Signed)
Please schedule your next medicare wellness visit with me in 1 yr.  Frederick Guzman , Thank you for taking time to come for your Medicare Wellness Visit. I appreciate your ongoing commitment to your health goals. Please review the following plan we discussed and let me know if I can assist you in the future.   Bring a copy of your living will and/or healthcare power of attorney to your next office visit. These are the goals we discussed: Goals    . DIET - INCREASE WATER INTAKE     Increase water intake to 32 ounces daily. Start walking to get daily exercise.     Cholesterol  Cholesterol is a fat. Your body needs a small amount of cholesterol. Cholesterol (plaque) may build up in your blood vessels (arteries). That makes you more likely to have a heart attack or stroke. You cannot feel your cholesterol level. Having a blood test is the only way to find out if your level is high. Keep your test results. Work with your doctor to keep your cholesterol at a good level. What do the results mean?  Total cholesterol is how much cholesterol is in your blood.  LDL is bad cholesterol. This is the type that can build up. Try to have low LDL.  HDL is good cholesterol. It cleans your blood vessels and carries LDL away. Try to have high HDL.  Triglycerides are fat that the body can store or burn for energy. What are good levels of cholesterol?  Total cholesterol below 200.  LDL below 100 is good for people who have health risks. LDL below 70 is good for people who have very high risks.  HDL above 40 is good. It is best to have HDL of 60 or higher.  Triglycerides below 150. How can I lower my cholesterol? Diet Follow your diet program as told by your doctor.  Choose fish, white meat chicken, or [place] that is roasted or baked. Try not to eat red meat, fried foods, sausage, or lunch meats.  Eat lots of fresh fruits and vegetables.  Choose whole grains, beans, pasta, potatoes, and cereals.   Choose olive oil, corn oil, or canola oil. Only use small amounts.  Try not to eat butter, mayonnaise, shortening, or palm kernel oils.  Try not to eat foods with trans fats.  Choose low-fat or nonfat dairy foods. ? Drink skim or nonfat milk. ? Eat low-fat or nonfat yogurt and cheeses. ? Try not to drink whole milk or cream. ? Try not to eat ice cream, egg yolks, or full-fat cheeses.  Healthy desserts include angel food cake, ginger snaps, animal crackers, hard candy, popsicles, and low-fat or nonfat frozen yogurt. Try not to eat pastries, cakes, pies, and cookies.  Exercise Follow your exercise program as told by your doctor.  Be more active. Try gardening, walking, and taking the stairs.  Ask your doctor about ways that you can be more active. Medicine  Take over-the-counter and prescription medicines only as told by your doctor. This information is not intended to replace advice given to you by your health care provider. Make sure you discuss any questions you have with your health care provider. Document Released: 06/04/2008 Document Revised: 10/08/2015 Document Reviewed: 09/18/2015 Elsevier Interactive Patient Education  2019 [employer].

## 2018-06-26 NOTE — Telephone Encounter (Signed)
Noted. See other note.

## 2018-06-26 NOTE — Telephone Encounter (Signed)
Provider from [facility] called stating that PA for testosterone 30 mg has been approved. Approval is from 06/26/18 - 06/26/19. No other inquiries during call.

## 2018-06-26 NOTE — Telephone Encounter (Signed)
Approvedtoday (testosterone) Effective from 06/26/2018 through 06/26/2019. Pharmacy aware.

## 2018-07-10 ENCOUNTER — Ambulatory Visit: Payer: Medicare Other | Admitting: Osteopathic Medicine

## 2018-07-11 ENCOUNTER — Encounter: Payer: Self-pay | Admitting: Osteopathic Medicine

## 2018-07-11 ENCOUNTER — Ambulatory Visit (INDEPENDENT_AMBULATORY_CARE_PROVIDER_SITE_OTHER): Payer: Medicare Other | Admitting: Osteopathic Medicine

## 2018-07-11 VITALS — BP 148/68 | HR 87 | Temp 97.9°F | Wt 192.0 lb

## 2018-07-11 DIAGNOSIS — E291 Testicular hypofunction: Secondary | ICD-10-CM | POA: Diagnosis not present

## 2018-07-11 DIAGNOSIS — F418 Other specified anxiety disorders: Secondary | ICD-10-CM | POA: Diagnosis not present

## 2018-07-11 DIAGNOSIS — R3914 Feeling of incomplete bladder emptying: Secondary | ICD-10-CM | POA: Diagnosis not present

## 2018-07-11 DIAGNOSIS — E119 Type 2 diabetes mellitus without complications: Secondary | ICD-10-CM

## 2018-07-11 DIAGNOSIS — N401 Enlarged prostate with lower urinary tract symptoms: Secondary | ICD-10-CM | POA: Diagnosis not present

## 2018-07-11 LAB — POCT GLYCOSYLATED HEMOGLOBIN (HGB A1C): Hemoglobin A1C: 6.6 % — AB (ref 4.0–5.6)

## 2018-07-11 MED ORDER — TADALAFIL 5 MG PO TABS: 5.0000 mg | ORAL_TABLET | Freq: Every day | ORAL | 3 refills | Status: DC

## 2018-07-11 NOTE — Patient Instructions (Addendum)
Will send referrals for Urology Ophthalmology  Refills for  Cialis  PSA in 03/2018 was normal   Continue follow up with GI as directed  1-2 weeks prior to trip, try taking Wellbutrin 1.5 tablets (450 mg total daily)

## 2018-07-11 NOTE — Progress Notes (Signed)
HPI: Frederick Guzman is a 68 y.o. male who  has a past medical history of Allergy, Arthritis, Asthma, Cancer ([place]) (08/2014), COPD (chronic obstructive pulmonary disease) ([place]), Depression, Diabetes mellitus without complication ([place]), Diverticulitis, Dyspnea, Fibromyalgia, GERD (gastroesophageal reflux disease), Hyperlipemia, Hypertension, Lymphocytic colitis (04/2018), Pneumonia (06/2015, 10/2016), and Sleep apnea.  he presents to [facility] today, 07/11/18,  for chief complaint of:  DM2 f/u  DIABETES SCREENING/PREVENTIVE CARE: Antidiabetic medications:  Current: Glimepiride 2 mg daily   Previous: Farxiga ($), Jardiance ($) A1C past 3-6 mos: Yes   09/21/16 8.4% -->stay on just Glimepiride due to intolerance to metformin, reluctant to take [place] due to cost, financial issues getting medications.   12/15/16: 7.0%   03/24/17: 6.2%  06/27/17: 6.4%  10/17/17: 6.4%  04/10/18: 6.5%   Today 07/11/18: 6.6% he is logging foods BP goal <130/80: Yes/close  BP Readings from Last 3 Encounters:  07/11/18 (!) 148/68  06/26/18 (!) 146/85  05/11/18 114/61  LDL goal <70: was 63 on labs 03/2018 Eye exam annually: none on file, importance discussed with patient Foot exam:needs Microalbuminuria: N/a on ACE/ARB Metformin: started 09/2016 but intolerant  ACE/ARB: Yes  Antiplatelet if ASCVD Risk >10% - he is taking low dose ASA every other day though GI recommended d/c this medicine  Statin: Taking Pneumovax: done  New concerns: Needs eye exam Advance Directive paperwork needed   Other:  Following with GI for lymphocytic colitis  Monitoring foods and BM/day  Requests urology referral for urinary hesitancy.   Planning a road trip out [place] once coronavirus restrictions are lifted. Would like to know how they can manage his anxiety particularly road rage.     At today's visit 07/11/18 ... PMH, PSH, FH reviewed and updated as needed.   Current medication list and allergy/intolerance hx reviewed and updated as needed. (See remainder of HPI, ROS, Phys Exam below)   No results found.  Results for orders placed or performed in visit on 07/11/18 (from the past 72 hour(s))  POCT HgB A1C     Status: Abnormal   Collection Time: 07/11/18 10:58 AM  Result Value Ref Range   Hemoglobin A1C 6.6 (A) 4.0 - 5.6 %   HbA1c POC (<> result, manual entry)     HbA1c, POC (prediabetic range)     HbA1c, POC (controlled diabetic range)            ASSESSMENT/PLAN: The primary encounter diagnosis was Diabetes mellitus without complication ([place]). Diagnoses of Anxious depression, Testosterone deficiency in male, and Benign prostatic hyperplasia with incomplete bladder emptying were also pertinent to this visit.   Orders Placed This Encounter  Procedures  . Ambulatory referral to Ophthalmology  . Ambulatory referral to Urology  . POCT HgB A1C     Meds ordered this encounter  Medications  . tadalafil (CIALIS) 5 MG tablet    Sig: Take 1 tablet (5 mg total) by mouth daily.    Dispense:  90 tablet    Refill:  3    Patient Instructions  Will send referrals for Urology Ophthalmology  Refills for  Cialis  PSA in 03/2018 was normal   Continue follow up with GI as directed  1-2 weeks prior to trip, try taking Wellbutrin 1.5 tablets (450 mg total daily)          Follow-up plan: Return in about 3 months (around 10/10/2018) for follow up A1C, see me sooner if needed! .                                                 ################################################# ################################################# ################################################# #################################################  Current Meds  Medication Sig  . albuterol (PROAIR HFA) 108 (90 Base) MCG/ACT inhaler Inhale 2 puffs into the lungs every 6 (six) hours as needed for wheezing  or shortness of breath.  Marland Kitchen albuterol (PROVENTIL) (2.5 MG/3ML) 0.083% nebulizer solution Take 2.5 mg by nebulization every 6 (six) hours as needed for wheezing or shortness of breath.  Marland Kitchen atorvastatin (LIPITOR) 20 MG tablet Take 1 tablet (20 mg total) by mouth every other day. (Patient taking differently: Take 20 mg by mouth See admin instructions. Takes one tablet every other day at bedtime.)  . b complex vitamins tablet Take 1 tablet by mouth daily.  . budesonide (ENTOCORT EC) 3 MG 24 hr capsule Take 3 capsules (9 mg total) by mouth daily.  . budesonide-formoterol (SYMBICORT) 160-4.5 MCG/ACT inhaler Inhale 2 puffs into the lungs 2 (two) times daily.  Marland Kitchen buPROPion (WELLBUTRIN XL) 300 MG 24 hr tablet Take 1 tablet (300 mg total) by mouth daily.  . cetirizine (ZYRTEC) 10 MG tablet Take 10 mg by mouth daily as needed for allergies.   . diphenoxylate-atropine (LOMOTIL) 2.5-0.025 MG tablet Take 1 tablet by mouth 4 (four) times daily as needed for diarrhea or loose stools.  Marland Kitchen esomeprazole (NEXIUM) 40 MG capsule Take 1 capsule (40 mg total) by mouth daily.  . fluticasone (FLONASE) 50 MCG/ACT nasal spray Place 1 spray into both nostrils daily as needed for allergies.  Marland Kitchen glimepiride (AMARYL) 2 MG tablet Take 1 tablet (2 mg total) by mouth daily.  Marland Kitchen glucose blood test strip Use up to 4 times per day as directed with glucometer. Disp: 100. Refill x99  . ketoconazole (NIZORAL) 2 % cream Apply 1 application topically daily as needed (redness).  Marland Kitchen lisinopril (PRINIVIL,ZESTRIL) 2.5 MG tablet Take 1 tablet (2.5 mg total) by mouth at bedtime.  . Multiple Vitamin (MULTIVITAMIN WITH MINERALS) TABS tablet Take 1 tablet by mouth at bedtime.  . Omega-3 Fatty Acids (FISH OIL) 1000 MG CAPS Take 1,000 mg by mouth at bedtime.   . Polyvinyl Alcohol-Povidone (REFRESH OP) Place 1 drop into both eyes daily as needed (dry eyes).  Manus Gunning BOWEL PREP KIT 17.5-3.13-1.6 GM/177ML SOLN Take 1 kit by mouth once for 1 dose. Suprep as  directed. No substitutions.  . tadalafil (CIALIS) 5 MG tablet Take 1 tablet (5 mg total) by mouth daily.  . tamsulosin (FLOMAX) 0.4 MG CAPS capsule Take 1 capsule (0.4 mg total) by mouth 2 (two) times daily.  . Testosterone 30 MG/ACT SOLN APPLY 1-2 PUMPS DAILY. ALTERNATE TAKING ONE PUMP UNDER ONE ARM ONE DAY, TWO PUMPS THE NEXT DAY (ONE PUMP UNDER EACH ARM)  . tiotropium (SPIRIVA HANDIHALER) 18 MCG inhalation capsule Place 1 capsule (18 mcg total) into inhaler and inhale daily.  . vitamin C (ASCORBIC ACID) 500 MG tablet Take 500 mg by mouth daily.  . [DISCONTINUED] tadalafil (CIALIS) 5 MG tablet TAKE 1 TABLET BY MOUTH ONCE DAILY   Current Facility-Administered Medications for the 07/11/18 encounter (Office Visit) with [name], DO  Medication  . 0.9 %  sodium chloride infusion    Allergies  Allergen Reactions  . Methotrexate Derivatives Nausea And Vomiting and Other (See Comments)    Hallucinations and aggressive behavior    . Tramadol Nausea And Vomiting    Hallucinations and aggressive behavior        Review of Systems:  Constitutional: No recent illness  HEENT: No  headache, no vision change  Cardiac: No  chest pain, No  pressure, No palpitations  Respiratory:  No  shortness of breath. No  Cough  Gastrointestinal: No  abdominal pain, diarrhea persistent, see HPI  Neurologic: No  weakness, No  Dizziness  Psychiatric: No  concerns with depression, +concerns with anxiety  Exam:  BP (!) 148/68 (BP Location: Left Arm, Patient Position: Sitting, Cuff Size: Normal)   Pulse 87   Temp 97.9 F (36.6 C) (Oral)   Wt 192 lb (87.1 kg)   SpO2 100%   BMI 25.33 kg/m   Constitutional: VS see above. General Appearance: alert, well-developed, well-nourished, NAD  Eyes: Normal lids and conjunctive, non-icteric sclera  Ears, Nose, Mouth, Throat: MMM, Normal external inspection ears/nares/mouth/lips/gums.  Neck: No masses, trachea midline.   Respiratory: Normal  respiratory effort. no wheeze, no rhonchi, no rales  Cardiovascular: S1/S2 normal, no murmur, no rub/gallop auscultated. RRR.   Musculoskeletal: Gait normal. Symmetric and independent movement of all extremities  Neurological: Normal balance/coordination. No tremor.  Skin: warm, dry, intact.   Psychiatric: Normal judgment/insight. Normal mood and affect. Oriented x3.       Visit summary with medication list and pertinent instructions was printed for patient to review, patient was advised to alert [place] if any updates are needed. All questions at time of visit were answered - patient instructed to contact office with any additional concerns. ER/RTC precautions were reviewed with the patient and understanding verbalized.   Note: Total time spent 25 minutes, greater than 50% of the visit was spent face-to-face counseling and coordinating care for the following: The primary encounter diagnosis was Diabetes mellitus without complication ([place]). Diagnoses of Anxious depression, Testosterone deficiency in male, and Benign prostatic hyperplasia with incomplete bladder emptying were also pertinent to this visit.Marland Kitchen  Please note: voice recognition software was used to produce this document, and typos may escape review. Please contact Dr. Sheppard Coil for any needed clarifications.    Follow up plan: Return in about 3 months (around 10/10/2018) for follow up A1C, see me sooner if needed! Marland Kitchen

## 2018-07-17 ENCOUNTER — Telehealth: Payer: Self-pay | Admitting: Osteopathic Medicine

## 2018-07-17 NOTE — Telephone Encounter (Signed)
Patients spouse called and states that he was referred to an eye doctor in [place] but he is currently seeing [facility] in [place] and does not want to see another eye doctor. FYI.

## 2018-07-19 NOTE — Telephone Encounter (Signed)
Resent referral to [place]

## 2018-07-27 DIAGNOSIS — L570 Actinic keratosis: Secondary | ICD-10-CM | POA: Diagnosis not present

## 2018-07-27 DIAGNOSIS — Z85828 Personal history of other malignant neoplasm of skin: Secondary | ICD-10-CM | POA: Diagnosis not present

## 2018-07-27 DIAGNOSIS — L218 Other seborrheic dermatitis: Secondary | ICD-10-CM | POA: Diagnosis not present

## 2018-07-27 DIAGNOSIS — L821 Other seborrheic keratosis: Secondary | ICD-10-CM | POA: Diagnosis not present

## 2018-07-27 DIAGNOSIS — Z85820 Personal history of malignant melanoma of skin: Secondary | ICD-10-CM | POA: Diagnosis not present

## 2018-07-27 DIAGNOSIS — D225 Melanocytic nevi of trunk: Secondary | ICD-10-CM | POA: Diagnosis not present

## 2018-07-28 ENCOUNTER — Encounter: Payer: Self-pay | Admitting: Osteopathic Medicine

## 2018-07-28 ENCOUNTER — Other Ambulatory Visit: Payer: Self-pay | Admitting: Osteopathic Medicine

## 2018-07-31 NOTE — Telephone Encounter (Signed)
Sent to [facility] on 4/28 he can call them and schedule an appointment - CF

## 2018-08-02 ENCOUNTER — Other Ambulatory Visit: Payer: Self-pay | Admitting: Gastroenterology

## 2018-08-05 ENCOUNTER — Other Ambulatory Visit: Payer: Self-pay | Admitting: Osteopathic Medicine

## 2018-08-08 DIAGNOSIS — R3914 Feeling of incomplete bladder emptying: Secondary | ICD-10-CM | POA: Diagnosis not present

## 2018-08-08 DIAGNOSIS — N411 Chronic prostatitis: Secondary | ICD-10-CM | POA: Diagnosis not present

## 2018-08-08 DIAGNOSIS — R350 Frequency of micturition: Secondary | ICD-10-CM | POA: Diagnosis not present

## 2018-08-08 DIAGNOSIS — N401 Enlarged prostate with lower urinary tract symptoms: Secondary | ICD-10-CM | POA: Diagnosis not present

## 2018-08-15 ENCOUNTER — Telehealth: Payer: Self-pay

## 2018-08-15 NOTE — Telephone Encounter (Signed)
Covid-19 travel screening questions  Have you traveled in the last 14 days? NO  If yes where?  Do you now or have you had a fever in the last 14 days? NO  Do you have any respiratory symptoms of shortness of breath or cough now or in the last 14 days? YES, COUGH,   Do you have a medical history of Congestive Heart Failure? NO   Do you have a medical history of lung disease? COPD  Do you have any family members or close contacts with diagnosed or suspected Covid-19? NO

## 2018-08-17 ENCOUNTER — Encounter: Payer: Self-pay | Admitting: Gastroenterology

## 2018-08-17 ENCOUNTER — Ambulatory Visit (INDEPENDENT_AMBULATORY_CARE_PROVIDER_SITE_OTHER): Payer: Medicare Other | Admitting: Gastroenterology

## 2018-08-17 VITALS — BP 160/70 | HR 100 | Ht 73.0 in | Wt 207.4 lb

## 2018-08-17 DIAGNOSIS — R198 Other specified symptoms and signs involving the digestive system and abdomen: Secondary | ICD-10-CM | POA: Diagnosis not present

## 2018-08-17 DIAGNOSIS — K529 Noninfective gastroenteritis and colitis, unspecified: Secondary | ICD-10-CM | POA: Diagnosis not present

## 2018-08-17 DIAGNOSIS — K52832 Lymphocytic colitis: Secondary | ICD-10-CM

## 2018-08-17 MED ORDER — BUDESONIDE 3 MG PO CPEP: 9.0000 mg | ORAL_CAPSULE | Freq: Every day | ORAL | 3 refills | Status: DC

## 2018-08-17 NOTE — Progress Notes (Addendum)
[facility] Progress Note  Chief Complaint: Lymphocytic colitis.   Subjective  History: Telemedicine follow-up.on 06/14/2018, patient improved on budesonide 9 mg daily with as needed use of Lomotil or Imodium. Frederick Guzman has been doing generally well, and most days has 1 semi-formed stool in the morning.  Some days he has episodes of urgency where he may not get to the bathroom in time and has had a few accidents.  When that occurs, there is mucus in the stool but no blood.  He has not been taking antidiarrheal medicines at least for the last couple of weeks.  He continued the 9 mg dose of budesonide until he ran out a few days ago. [place] has also had some urinary retention lately, and saw Dr. Louis Meckel at [facility].  He was put on meloxicam and Flomax, both of which have helped.  He believes he will probably be on about a 6-week course of this. He also feels he cannot stop his Nexium because he will have severe heartburn.  Ranitidine or Pepcid have apparently been much less helpful in the past in order to control his reflux.  Appetite has been good and weight increasing  ROS: Cardiovascular:  no chest pain Respiratory: no dyspnea Recently worsening peripheral edema.  He has had a to a lesser degree for several years.  The patient's Past Medical, Family and Social History were reviewed and are on file in the EMR. Still smoking  Objective:  Med list reviewed  Current Outpatient Medications:  .  albuterol (PROAIR HFA) 108 (90 Base) MCG/ACT inhaler, Inhale 2 puffs into the lungs every 6 (six) hours as needed for wheezing or shortness of breath., Disp: , Rfl:  .  albuterol (PROVENTIL) (2.5 MG/3ML) 0.083% nebulizer solution, Take 2.5 mg by nebulization every 6 (six) hours as needed for wheezing or shortness of breath., Disp: , Rfl:  .  atorvastatin (LIPITOR) 20 MG tablet, Take 1 tablet (20 mg total) by mouth every other day. (Patient taking differently: Take 20 mg by mouth See admin  instructions. Takes one tablet every other day at bedtime.), Disp: 90 tablet, Rfl: 1 .  b complex vitamins tablet, Take 1 tablet by mouth daily., Disp: , Rfl:  .  budesonide (ENTOCORT EC) 3 MG 24 hr capsule, Take 3 capsules (9 mg total) by mouth daily., Disp: 90 capsule, Rfl: 3 .  budesonide-formoterol (SYMBICORT) 160-4.5 MCG/ACT inhaler, Inhale 2 puffs into the lungs 2 (two) times daily., Disp: , Rfl:  .  buPROPion (WELLBUTRIN XL) 300 MG 24 hr tablet, Take 1 tablet (300 mg total) by mouth daily., Disp: 90 tablet, Rfl: 3 .  cetirizine (ZYRTEC) 10 MG tablet, Take 10 mg by mouth daily as needed for allergies. , Disp: , Rfl:  .  diphenoxylate-atropine (LOMOTIL) 2.5-0.025 MG tablet, Take 1 tablet by mouth 4 (four) times daily as needed for diarrhea or loose stools., Disp: 15 tablet, Rfl: 1 .  esomeprazole (NEXIUM) 40 MG capsule, Take 1 capsule (40 mg total) by mouth daily., Disp: 90 capsule, Rfl: 1 .  fluticasone (FLONASE) 50 MCG/ACT nasal spray, Place 1 spray into both nostrils daily as needed for allergies., Disp: , Rfl: 1 .  glimepiride (AMARYL) 2 MG tablet, Take 1 tablet (2 mg total) by mouth daily., Disp: 90 tablet, Rfl: 3 .  glucose blood test strip, Use up to 4 times per day as directed with glucometer. Disp: 100. Refill x99, Disp: 100 each, Rfl: 99 .  ketoconazole (NIZORAL) 2 % cream, Apply  1 application topically daily as needed (redness)., Disp: , Rfl:  .  lisinopril (PRINIVIL,ZESTRIL) 2.5 MG tablet, Take 1 tablet (2.5 mg total) by mouth at bedtime., Disp: 90 tablet, Rfl: 3 .  meloxicam (MOBIC) 15 MG tablet, Take 15 mg by mouth daily., Disp: , Rfl:  .  Multiple Vitamin (MULTIVITAMIN WITH MINERALS) TABS tablet, Take 1 tablet by mouth at bedtime., Disp: , Rfl:  .  Omega-3 Fatty Acids (FISH OIL) 1000 MG CAPS, Take 1,000 mg by mouth at bedtime. , Disp: , Rfl:  .  Polyvinyl Alcohol-Povidone (REFRESH OP), Place 1 drop into both eyes daily as needed (dry eyes)., Disp: , Rfl:  .  tadalafil (CIALIS) 5  MG tablet, Take 1 tablet (5 mg total) by mouth daily., Disp: 90 tablet, Rfl: 3 .  tamsulosin (FLOMAX) 0.4 MG CAPS capsule, Take 1 capsule (0.4 mg total) by mouth 2 (two) times daily. (Patient taking differently: Take 0.8 mg by mouth 2 (two) times daily. ), Disp: 60 capsule, Rfl: 3 .  Testosterone 30 MG/ACT SOLN, APPLY 1-2 PUMPS DAILY. ALTERNATE TAKING ONE PUMP UNDER ONE ARM ONE DAY, TWO PUMPS THE NEXT DAY (ONE PUMP UNDER EACH ARM), Disp: 90 mL, Rfl: 1 .  tiotropium (SPIRIVA HANDIHALER) 18 MCG inhalation capsule, Place 1 capsule (18 mcg total) into inhaler and inhale daily., Disp: 30 capsule, Rfl: 2 .  vitamin C (ASCORBIC ACID) 500 MG tablet, Take 500 mg by mouth daily., Disp: , Rfl:    Vital signs in last 24 hrs: Vitals:   08/17/18 1125  BP: (!) 160/70  Pulse: 100    Physical Exam  He is well-appearing, pleasant and conversational  HEENT: sclera anicteric, oral mucosa moist without lesions  Neck: supple, no thyromegaly, JVD or lymphadenopathy  Cardiac: RRR without murmurs, S1S2 heard, pitting edema to midcalf bilaterally  Pulm: clear to auscultation bilaterally, normal RR and effort noted  Abdomen: soft, no tenderness, with active bowel sounds. No guarding or palpable hepatosplenomegaly.  Skin; warm and dry, no jaundice or rash   @ASSESSMENTPLANBEGIN @ Assessment: Encounter Diagnoses  Name Primary?  . Lymphocytic colitis Yes  . Chronic diarrhea   . Ten[place]    His colitis is generally under good control.  It seems unlikely that that condition or the budesonide are causing his recent urinary retention.  Short-term use of NSAID is not likely to affect this condition, however I would prefer he were not on it long-term because it can worsen the lymphocytic colitis.  Fortunately, he does not feel that he can go without his PPI, even though I again explained that this is a medicine that potentially causes or worsens lymphocytic colitis.  If he would be willing to undergo another  trial of change to H2 blocker, I think that would be worthwhile.  Given his intermittent episodes of urgency will, I do not want to decrease the dose of budesonide at present.    Plan: Refill budesonide 9 mg once daily. He was encouraged to take 1 Imodium tablet daily to see if he can avoid the episodes of urgency and loose mucus stools.  He would have to stop that if constipation develop.  Would like to see him back in 2 months.  He will be traveling out [place] around that time, so I encouraged him to call us in July for a follow-up visit when he returns.  He was encouraged to call me sooner as needed.   Total time 25 minutes, over half spent face-to-face with patient in counseling and coordination  of care.   Nelida Meuse III CC: Dr. Louis Meckel [facility])

## 2018-08-17 NOTE — Patient Instructions (Signed)
We have sent the following medications to your pharmacy for you to pick up at your convenience: Budesonide  If you are age 69 or older, your body mass index should be between 23-30. Your Body mass index is 27.36 kg/m. If this is out of the aforementioned range listed, please consider follow up with your Primary Care Provider.  If you are age 22 or younger, your body mass index should be between 19-25. Your Body mass index is 27.36 kg/m. If this is out of the aformentioned range listed, please consider follow up with your Primary Care Provider.   It was a pleasure to see you today!  Dr. Loletha Carrow

## 2018-08-18 ENCOUNTER — Encounter: Payer: Self-pay | Admitting: Osteopathic Medicine

## 2018-08-22 ENCOUNTER — Encounter: Payer: Self-pay | Admitting: Osteopathic Medicine

## 2018-08-22 ENCOUNTER — Ambulatory Visit (INDEPENDENT_AMBULATORY_CARE_PROVIDER_SITE_OTHER): Payer: Medicare Other | Admitting: Osteopathic Medicine

## 2018-08-22 VITALS — BP 164/78 | HR 86 | Temp 98.2°F | Wt 204.1 lb

## 2018-08-22 DIAGNOSIS — R600 Localized edema: Secondary | ICD-10-CM | POA: Diagnosis not present

## 2018-08-22 MED ORDER — BUPROPION HCL ER (XL) 150 MG PO TB24: 150.0000 mg | ORAL_TABLET | ORAL | 0 refills | Status: DC

## 2018-08-22 NOTE — Progress Notes (Signed)
HPI: Frederick Guzman is a 68 y.o. male who  has a past medical history of Allergy, Arthritis, Asthma, Cancer ([place]) (08/2014), COPD (chronic obstructive pulmonary disease) ([place]), Depression, Diabetes mellitus without complication ([place]), Diverticulitis, Dyspnea, Fibromyalgia, GERD (gastroesophageal reflux disease), Hyperlipemia, Hypertension, Lymphocytic colitis (04/2018), Pneumonia (06/2015, 10/2016), and Sleep apnea.  he presents to [facility] today, 08/22/18,  for chief complaint of:  LE swelling   Compression stockings not helpful  Bilateral feet to ankles  Ongoing 2 weeks  Has been bothering him ever since starting meloxicam, this was started by his urologist.  TSH WNL 03/2018 No Echo/BNP No chest pain, orthopnea, shortness of breath.   At today's visit 08/22/18 ... PMH, PSH, FH reviewed and updated as needed.  Current medication list and allergy/intolerance hx reviewed and updated as needed. (See remainder of HPI, ROS, Phys Exam below)   No results found.  No results found for this or any previous visit (from the past 72 hour(s)).        ASSESSMENT/PLAN: The encounter diagnosis was Lower extremity edema.   Onset of edema coincides with initiation of NSAID, seems most likely medication side effect as opposed to other cause, will stop meloxicam, see how he does, would have low threshold for cardiac/vascular work-up.  Reminder set in epic to call patient on Friday to check on progress, patient is advised if anything changes or gets worse let me know sooner than that  No orders of the defined types were placed in this encounter.    Patient would like to increase his Wellbutrin over the next couple of months, will be going on a road trip and tends to get pretty irritable/road ragey  Meds ordered this encounter  Medications  . buPROPion (WELLBUTRIN XL) 150 MG 24 hr tablet    Sig: Take 1 tablet (150 mg total) by mouth every morning.  To add to 300 mg daily for total of 450 mg p.o. daily    Dispense:  90 tablet    Refill:  0    Patient Instructions  Plan:  For the swelling: Would stop the meloxicam Can try compression stockings for the next few days We will call you on Friday to see how you are doing Any worsening or change in the meantime, let me know If not getting better, will evaluate for heart failure/start fluid pill  We will send 150 mg of the Wellbutrin into your pharmacy, can add these to the 300 mg for total 450 mg daily.  I sent a 90-day supply.       Follow-up plan: Return if symptoms worsen or fail to improve, otherwise when back from your trip!.                                                 ################################################# ################################################# ################################################# #################################################    Current Meds  Medication Sig  . albuterol (PROAIR HFA) 108 (90 Base) MCG/ACT inhaler Inhale 2 puffs into the lungs every 6 (six) hours as needed for wheezing or shortness of breath.  Marland Kitchen albuterol (PROVENTIL) (2.5 MG/3ML) 0.083% nebulizer solution Take 2.5 mg by nebulization every 6 (six) hours as needed for wheezing or shortness of breath.  Marland Kitchen atorvastatin (LIPITOR) 20 MG tablet Take 1 tablet (20 mg total) by mouth every other day. (Patient taking differently: Take 20 mg by mouth See  admin instructions. Takes one tablet every other day at bedtime.)  . b complex vitamins tablet Take 1 tablet by mouth daily.  . budesonide (ENTOCORT EC) 3 MG 24 hr capsule Take 3 capsules (9 mg total) by mouth daily.  . budesonide-formoterol (SYMBICORT) 160-4.5 MCG/ACT inhaler Inhale 2 puffs into the lungs 2 (two) times daily.  Marland Kitchen buPROPion (WELLBUTRIN XL) 300 MG 24 hr tablet Take 1 tablet (300 mg total) by mouth daily.  . cetirizine (ZYRTEC) 10 MG tablet Take 10 mg by mouth daily as  needed for allergies.   . diphenoxylate-atropine (LOMOTIL) 2.5-0.025 MG tablet Take 1 tablet by mouth 4 (four) times daily as needed for diarrhea or loose stools.  Marland Kitchen esomeprazole (NEXIUM) 40 MG capsule Take 1 capsule (40 mg total) by mouth daily.  . fluticasone (FLONASE) 50 MCG/ACT nasal spray Place 1 spray into both nostrils daily as needed for allergies.  Marland Kitchen glimepiride (AMARYL) 2 MG tablet Take 1 tablet (2 mg total) by mouth daily.  Marland Kitchen glucose blood test strip Use up to 4 times per day as directed with glucometer. Disp: 100. Refill x99  . ketoconazole (NIZORAL) 2 % cream Apply 1 application topically daily as needed (redness).  Marland Kitchen lisinopril (PRINIVIL,ZESTRIL) 2.5 MG tablet Take 1 tablet (2.5 mg total) by mouth at bedtime.  . meloxicam (MOBIC) 15 MG tablet Take 15 mg by mouth daily.  . Multiple Vitamin (MULTIVITAMIN WITH MINERALS) TABS tablet Take 1 tablet by mouth at bedtime.  . Omega-3 Fatty Acids (FISH OIL) 1000 MG CAPS Take 1,000 mg by mouth at bedtime.   . Polyvinyl Alcohol-Povidone (REFRESH OP) Place 1 drop into both eyes daily as needed (dry eyes).  . tadalafil (CIALIS) 5 MG tablet Take 1 tablet (5 mg total) by mouth daily.  . tamsulosin (FLOMAX) 0.4 MG CAPS capsule Take 1 capsule (0.4 mg total) by mouth 2 (two) times daily. (Patient taking differently: Take 0.8 mg by mouth 2 (two) times daily. )  . Testosterone 30 MG/ACT SOLN APPLY 1-2 PUMPS DAILY. ALTERNATE TAKING ONE PUMP UNDER ONE ARM ONE DAY, TWO PUMPS THE NEXT DAY (ONE PUMP UNDER EACH ARM)  . tiotropium (SPIRIVA HANDIHALER) 18 MCG inhalation capsule Place 1 capsule (18 mcg total) into inhaler and inhale daily.  . vitamin C (ASCORBIC ACID) 500 MG tablet Take 500 mg by mouth daily.    Allergies  Allergen Reactions  . Methotrexate Derivatives Nausea And Vomiting and Other (See Comments)    Hallucinations and aggressive behavior    . Tramadol Nausea And Vomiting    Hallucinations and aggressive behavior        Review of  Systems:  Constitutional: No recent illness  HEENT: No  headache, no vision change  Cardiac: No  chest pain, No  pressure, No palpitations  Respiratory:  No  shortness of breath. No  Cough  Gastrointestinal: No  abdominal pain, no change on bowel habits  Musculoskeletal: No new myalgia/arthralgia  Skin: No  Rash  Hem/Onc: No  easy bruising/bleeding, No  abnormal lumps/bumps  Neurologic: No  weakness, No  Dizziness  Psychiatric: No  concerns with depression, No  concerns with anxiety  Exam:  BP (!) 164/78 (BP Location: Left Arm, Patient Position: Sitting, Cuff Size: Normal)   Pulse 86   Temp 98.2 F (36.8 C) (Oral)   Wt 204 lb 1.6 oz (92.6 kg)   BMI 26.93 kg/m   Constitutional: VS see above. General Appearance: alert, well-developed, well-nourished, NAD  Eyes: Normal lids and conjunctive, non-icteric sclera  Ears, Nose, Mouth, Throat: MMM, Normal external inspection ears/nares/mouth/lips/gums.  Neck: No masses, trachea midline.   Respiratory: Normal respiratory effort. no wheeze, no rhonchi, no rales  Cardiovascular: S1/S2 normal, no murmur, no rub/gallop auscultated. RRR.  Pitting lower extremity edema +2 to ankles +1 to mid tibia, equal bilaterally, negative Homans  Musculoskeletal: Gait normal. Symmetric and independent movement of all extremities  Abdominal: non-tender, non-distended, no appreciable organomegaly, neg Murphy's, BS WNLx4  Neurological: Normal balance/coordination. No tremor.  Skin: warm, dry, intact.   Psychiatric: Normal judgment/insight. Normal mood and affect. Oriented x3.       Visit summary with medication list and pertinent instructions was printed for patient to review, patient was advised to alert [place] if any updates are needed. All questions at time of visit were answered - patient instructed to contact office with any additional concerns. ER/RTC precautions were reviewed with the patient and understanding verbalized.     Please  note: voice recognition software was used to produce this document, and typos may escape review. Please contact Dr. Sheppard Coil for any needed clarifications.    Follow up plan: Return if symptoms worsen or fail to improve, otherwise when back from your trip!Marland Kitchen

## 2018-08-22 NOTE — Patient Instructions (Signed)
Plan:  For the swelling: Would stop the meloxicam Can try compression stockings for the next few days We will call you on Friday to see how you are doing Any worsening or change in the meantime, let me know If not getting better, will evaluate for heart failure/start fluid pill  We will send 150 mg of the Wellbutrin into your pharmacy, can add these to the 300 mg for total 450 mg daily.  I sent a 90-day supply.

## 2018-08-28 ENCOUNTER — Encounter: Payer: Self-pay | Admitting: Osteopathic Medicine

## 2018-08-29 ENCOUNTER — Telehealth: Payer: Self-pay | Admitting: Osteopathic Medicine

## 2018-08-29 MED ORDER — FUROSEMIDE 20 MG PO TABS: 20.0000 mg | ORAL_TABLET | Freq: Two times a day (BID) | ORAL | 1 refills | Status: DC | PRN

## 2018-08-29 NOTE — Telephone Encounter (Signed)
-----   Message from [name], DO sent at 08/22/2018  3:38 PM EDT ----- We stopped meloxicam, has edema improved?

## 2018-08-29 NOTE — Telephone Encounter (Signed)
Called pt, states swelling has not improved. He states no improvement but not noted any worsening nor new SX. Will start Lasix today

## 2018-08-31 ENCOUNTER — Other Ambulatory Visit: Payer: Self-pay | Admitting: Osteopathic Medicine

## 2018-09-08 ENCOUNTER — Other Ambulatory Visit: Payer: Self-pay | Admitting: Osteopathic Medicine

## 2018-09-11 ENCOUNTER — Other Ambulatory Visit: Payer: Self-pay | Admitting: Osteopathic Medicine

## 2018-09-11 NOTE — Telephone Encounter (Signed)
Please advise 

## 2018-09-12 ENCOUNTER — Encounter: Payer: Self-pay | Admitting: Osteopathic Medicine

## 2018-09-13 DIAGNOSIS — H25813 Combined forms of age-related cataract, bilateral: Secondary | ICD-10-CM | POA: Diagnosis not present

## 2018-09-13 DIAGNOSIS — H524 Presbyopia: Secondary | ICD-10-CM | POA: Diagnosis not present

## 2018-09-13 DIAGNOSIS — E119 Type 2 diabetes mellitus without complications: Secondary | ICD-10-CM | POA: Diagnosis not present

## 2018-09-15 DIAGNOSIS — H35033 Hypertensive retinopathy, bilateral: Secondary | ICD-10-CM | POA: Diagnosis not present

## 2018-09-15 DIAGNOSIS — H02831 Dermatochalasis of right upper eyelid: Secondary | ICD-10-CM | POA: Diagnosis not present

## 2018-09-15 DIAGNOSIS — E119 Type 2 diabetes mellitus without complications: Secondary | ICD-10-CM | POA: Diagnosis not present

## 2018-09-15 DIAGNOSIS — H31001 Unspecified chorioretinal scars, right eye: Secondary | ICD-10-CM | POA: Diagnosis not present

## 2018-09-15 DIAGNOSIS — H25813 Combined forms of age-related cataract, bilateral: Secondary | ICD-10-CM | POA: Diagnosis not present

## 2018-09-15 DIAGNOSIS — H43813 Vitreous degeneration, bilateral: Secondary | ICD-10-CM | POA: Diagnosis not present

## 2018-09-15 DIAGNOSIS — H02834 Dermatochalasis of left upper eyelid: Secondary | ICD-10-CM | POA: Diagnosis not present

## 2018-09-15 DIAGNOSIS — H527 Unspecified disorder of refraction: Secondary | ICD-10-CM | POA: Diagnosis not present

## 2018-09-18 DIAGNOSIS — H25819 Combined forms of age-related cataract, unspecified eye: Secondary | ICD-10-CM | POA: Diagnosis not present

## 2018-09-18 DIAGNOSIS — F17210 Nicotine dependence, cigarettes, uncomplicated: Secondary | ICD-10-CM | POA: Diagnosis not present

## 2018-09-18 DIAGNOSIS — Z1159 Encounter for screening for other viral diseases: Secondary | ICD-10-CM | POA: Diagnosis not present

## 2018-09-18 DIAGNOSIS — Z01818 Encounter for other preprocedural examination: Secondary | ICD-10-CM | POA: Diagnosis not present

## 2018-09-19 DIAGNOSIS — J432 Centrilobular emphysema: Secondary | ICD-10-CM | POA: Diagnosis not present

## 2018-09-19 DIAGNOSIS — R3912 Poor urinary stream: Secondary | ICD-10-CM | POA: Diagnosis not present

## 2018-09-19 DIAGNOSIS — R911 Solitary pulmonary nodule: Secondary | ICD-10-CM | POA: Diagnosis not present

## 2018-09-19 DIAGNOSIS — K439 Ventral hernia without obstruction or gangrene: Secondary | ICD-10-CM | POA: Diagnosis not present

## 2018-09-19 DIAGNOSIS — R918 Other nonspecific abnormal finding of lung field: Secondary | ICD-10-CM | POA: Diagnosis not present

## 2018-09-19 DIAGNOSIS — N401 Enlarged prostate with lower urinary tract symptoms: Secondary | ICD-10-CM | POA: Diagnosis not present

## 2018-09-20 ENCOUNTER — Other Ambulatory Visit: Payer: Self-pay | Admitting: Osteopathic Medicine

## 2018-09-21 DIAGNOSIS — H2181 Floppy iris syndrome: Secondary | ICD-10-CM | POA: Diagnosis not present

## 2018-09-21 DIAGNOSIS — H25811 Combined forms of age-related cataract, right eye: Secondary | ICD-10-CM

## 2018-09-21 DIAGNOSIS — H25813 Combined forms of age-related cataract, bilateral: Secondary | ICD-10-CM | POA: Diagnosis not present

## 2018-09-21 DIAGNOSIS — E1136 Type 2 diabetes mellitus with diabetic cataract: Secondary | ICD-10-CM | POA: Diagnosis not present

## 2018-09-21 DIAGNOSIS — H43813 Vitreous degeneration, bilateral: Secondary | ICD-10-CM | POA: Diagnosis not present

## 2018-09-21 DIAGNOSIS — E785 Hyperlipidemia, unspecified: Secondary | ICD-10-CM | POA: Diagnosis not present

## 2018-09-21 DIAGNOSIS — H02834 Dermatochalasis of left upper eyelid: Secondary | ICD-10-CM | POA: Diagnosis not present

## 2018-09-21 DIAGNOSIS — Z961 Presence of intraocular lens: Secondary | ICD-10-CM | POA: Diagnosis not present

## 2018-09-21 DIAGNOSIS — F17200 Nicotine dependence, unspecified, uncomplicated: Secondary | ICD-10-CM | POA: Diagnosis not present

## 2018-09-21 DIAGNOSIS — I10 Essential (primary) hypertension: Secondary | ICD-10-CM | POA: Diagnosis not present

## 2018-09-21 DIAGNOSIS — J449 Chronic obstructive pulmonary disease, unspecified: Secondary | ICD-10-CM | POA: Diagnosis not present

## 2018-09-21 DIAGNOSIS — K219 Gastro-esophageal reflux disease without esophagitis: Secondary | ICD-10-CM | POA: Diagnosis not present

## 2018-09-21 DIAGNOSIS — H02831 Dermatochalasis of right upper eyelid: Secondary | ICD-10-CM | POA: Diagnosis not present

## 2018-09-21 DIAGNOSIS — E119 Type 2 diabetes mellitus without complications: Secondary | ICD-10-CM | POA: Diagnosis not present

## 2018-09-21 DIAGNOSIS — H31001 Unspecified chorioretinal scars, right eye: Secondary | ICD-10-CM | POA: Diagnosis not present

## 2018-09-21 DIAGNOSIS — H527 Unspecified disorder of refraction: Secondary | ICD-10-CM | POA: Diagnosis not present

## 2018-09-21 DIAGNOSIS — H25812 Combined forms of age-related cataract, left eye: Secondary | ICD-10-CM | POA: Diagnosis not present

## 2018-09-21 DIAGNOSIS — H35033 Hypertensive retinopathy, bilateral: Secondary | ICD-10-CM | POA: Diagnosis not present

## 2018-09-21 HISTORY — DX: Combined forms of age-related cataract, right eye: H25.811

## 2018-09-21 LAB — HM DIABETES EYE EXAM

## 2018-09-27 DIAGNOSIS — Z961 Presence of intraocular lens: Secondary | ICD-10-CM | POA: Diagnosis not present

## 2018-09-27 DIAGNOSIS — H25812 Combined forms of age-related cataract, left eye: Secondary | ICD-10-CM | POA: Diagnosis not present

## 2018-09-28 ENCOUNTER — Encounter: Payer: Self-pay | Admitting: Osteopathic Medicine

## 2018-10-10 ENCOUNTER — Ambulatory Visit: Payer: Medicare Other | Admitting: Osteopathic Medicine

## 2018-10-18 ENCOUNTER — Other Ambulatory Visit: Payer: Self-pay | Admitting: Osteopathic Medicine

## 2018-10-18 NOTE — Telephone Encounter (Signed)
Requested Prescriptions  Pending Prescriptions Disp Refills  . furosemide (LASIX) 20 MG tablet [Pharmacy Med Name: furosemide 20 mg tablet] 60 tablet 2    Sig: Take 1 tablet (20 mg total) by mouth 2 (two) times daily as needed for edema.     Cardiovascular:  Diuretics - Loop Failed - 10/18/2018  8:00 AM      Failed - Last BP in normal range    BP Readings from Last 1 Encounters:  08/22/18 (!) 164/78         Passed - K in normal range and within 360 days    Potassium  Date Value Ref Range Status  04/10/2018 4.2 3.5 - 5.3 mmol/L Final         Passed - Ca in normal range and within 360 days    Calcium  Date Value Ref Range Status  04/10/2018 9.2 8.6 - 10.3 mg/dL Final         Passed - Na in normal range and within 360 days    Sodium  Date Value Ref Range Status  04/10/2018 138 135 - 146 mmol/L Final         Passed - Cr in normal range and within 360 days    Creat  Date Value Ref Range Status  04/10/2018 0.91 0.70 - 1.25 mg/dL Final    Comment:    For patients >46 years of age, the reference limit for Creatinine is approximately 13% higher for people identified as African-American. [name] - Valid encounter within last 6 months    Recent Outpatient Visits          1 month ago Lower extremity edema   [facility], [place], DO   3 months ago Diabetes mellitus without complication [facility])   [place] [facility], Lanelle Bal, DO   6 months ago Diabetes mellitus without complication [facility])   [facility], Lanelle Bal, DO   9 months ago Preoperative evaluation to rule out surgical contraindication   [facility], Lanelle Bal, DO   1 year ago Diabetes mellitus without complication [facility])   [place], Lanelle Bal, DO

## 2018-11-14 ENCOUNTER — Encounter: Payer: Self-pay | Admitting: Osteopathic Medicine

## 2018-11-14 ENCOUNTER — Other Ambulatory Visit: Payer: Self-pay

## 2018-11-14 ENCOUNTER — Ambulatory Visit (INDEPENDENT_AMBULATORY_CARE_PROVIDER_SITE_OTHER): Payer: Medicare Other | Admitting: Osteopathic Medicine

## 2018-11-14 VITALS — BP 152/80 | HR 84 | Temp 97.9°F | Wt 192.4 lb

## 2018-11-14 DIAGNOSIS — R911 Solitary pulmonary nodule: Secondary | ICD-10-CM | POA: Diagnosis not present

## 2018-11-14 DIAGNOSIS — I10 Essential (primary) hypertension: Secondary | ICD-10-CM | POA: Diagnosis not present

## 2018-11-14 DIAGNOSIS — E11649 Type 2 diabetes mellitus with hypoglycemia without coma: Secondary | ICD-10-CM | POA: Diagnosis not present

## 2018-11-14 DIAGNOSIS — Z720 Tobacco use: Secondary | ICD-10-CM | POA: Diagnosis not present

## 2018-11-14 DIAGNOSIS — G4733 Obstructive sleep apnea (adult) (pediatric): Secondary | ICD-10-CM | POA: Diagnosis not present

## 2018-11-14 DIAGNOSIS — J449 Chronic obstructive pulmonary disease, unspecified: Secondary | ICD-10-CM | POA: Diagnosis not present

## 2018-11-14 DIAGNOSIS — Z23 Encounter for immunization: Secondary | ICD-10-CM | POA: Diagnosis not present

## 2018-11-14 LAB — POCT GLYCOSYLATED HEMOGLOBIN (HGB A1C): Hemoglobin A1C: 6.2 % — AB (ref 4.0–5.6)

## 2018-11-14 MED ORDER — GLIMEPIRIDE 1 MG PO TABS: 1.0000 mg | ORAL_TABLET | Freq: Every day | ORAL | 1 refills | Status: DC

## 2018-11-14 NOTE — Progress Notes (Signed)
HPI: Frederick Guzman is a 68 y.o. male who  has a past medical history of Allergy, Arthritis, Asthma, Cancer ([place]) (08/2014), COPD (chronic obstructive pulmonary disease) ([place]), Depression, Diabetes mellitus without complication ([place]), Diverticulitis, Dyspnea, Fibromyalgia, GERD (gastroesophageal reflux disease), Hyperlipemia, Hypertension, Lymphocytic colitis (04/2018), Pneumonia (06/2015, 10/2016), and Sleep apnea.  he presents to [facility] today, 11/14/18,  for chief complaint of:  Follow-up diabetes Follow-up blood pressure  Diabetes: A1c is 6.2, patient reports some hypoglycemic episodes usually in the mid afternoon especially if he has not eaten anything.  Will become lightheaded and sweaty, sugars measure around 70 or so.  HTN: concern for elevated blood pressure, he is getting measurements of 130s over 50s at home using a wrist monitor.  No chest pain, pressure, shortness of breath.  Patient is accompanied by wife who assists with history-taking.    At today's visit 11/14/18 ... PMH, PSH, FH reviewed and updated as needed.  Current medication list and allergy/intolerance hx reviewed and updated as needed. (See remainder of HPI, ROS, Phys Exam below)   No results found.  Results for orders placed or performed in visit on 11/14/18 (from the past 72 hour(s))  POCT HgB A1C     Status: Abnormal   Collection Time: 11/14/18  9:05 AM  Result Value Ref Range   Hemoglobin A1C 6.2 (A) 4.0 - 5.6 %   HbA1c POC (<> result, manual entry)     HbA1c, POC (prediabetic range)     HbA1c, POC (controlled diabetic range)            ASSESSMENT/PLAN: The primary encounter diagnosis was Uncontrolled type 2 diabetes mellitus with hypoglycemia without coma ([place]). Diagnoses of Need for influenza vaccination and Essential hypertension were also pertinent to this visit.    Orders Placed This Encounter  Procedures  . Flu Vaccine QUAD High Dose(Fluad)  .  POCT HgB A1C     Meds ordered this encounter  Medications  . glimepiride (AMARYL) 1 MG tablet    Sig: Take 1 tablet (1 mg total) by mouth daily with breakfast.    Dispense:  90 tablet    Refill:  1    Cancel 2 mg dose thanks    Patient Instructions  Sugars: With sugars dropping, please be sure you are eating regular meals but the glimepiride might also be to blame here.  Let us decrease from 2 mg to 1 mg, can try cutting pills in half, I have sent a different prescription with the lower dose into the pharmacy for you.  Blood pressure: Goal is 130 top number, 80 bottom number or less than this pretty consistently.  Would recommend using a home blood pressure cuff that goes around the arm, not the wrist.  Let us have you scheduled for a nurse to check your home blood pressure monitor against our equipment to verify its accuracy.  If blood pressures continue to be above goal, we may consider increasing your medications.             Follow-up plan: Return for Visit verify home blood pressure monitor sometime in the next week or 2.  Follow up A1c 3 months.                                                 ################################################# ################################################# ################################################# #################################################  Current Meds  Medication Sig  . albuterol (PROAIR HFA) 108 (90 Base) MCG/ACT inhaler Inhale 2 puffs into the lungs every 6 (six) hours as needed for wheezing or shortness of breath.  Marland Kitchen albuterol (PROVENTIL) (2.5 MG/3ML) 0.083% nebulizer solution Take 2.5 mg by nebulization every 6 (six) hours as needed for wheezing or shortness of breath.  Marland Kitchen atorvastatin (LIPITOR) 20 MG tablet Take 1 tablet (20 mg total) by mouth every other day.  . b complex vitamins tablet Take 1 tablet by mouth daily.  . budesonide-formoterol (SYMBICORT) 160-4.5  MCG/ACT inhaler Inhale 2 puffs into the lungs 2 (two) times daily.  Marland Kitchen buPROPion (WELLBUTRIN XL) 300 MG 24 hr tablet Take 1 tablet (300 mg total) by mouth daily.  . cetirizine (ZYRTEC) 10 MG tablet Take 10 mg by mouth daily as needed for allergies.   Marland Kitchen esomeprazole (NEXIUM) 40 MG capsule Take 1 capsule (40 mg total) by mouth daily.  . fluticasone (FLONASE) 50 MCG/ACT nasal spray Place 1 spray into both nostrils daily as needed for allergies.  . furosemide (LASIX) 20 MG tablet Take 1 tablet (20 mg total) by mouth 2 (two) times daily as needed for edema.  Marland Kitchen glucose blood test strip Use up to 4 times per day as directed with glucometer. Disp: 100. Refill x99  . ketoconazole (NIZORAL) 2 % cream Apply 1 application topically daily as needed (redness).  Marland Kitchen lisinopril (ZESTRIL) 2.5 MG tablet Take 1 tablet (2.5 mg total) by mouth at bedtime.  . meloxicam (MOBIC) 15 MG tablet Take 15 mg by mouth daily.  . Multiple Vitamin (MULTIVITAMIN WITH MINERALS) TABS tablet Take 1 tablet by mouth at bedtime.  . Omega-3 Fatty Acids (FISH OIL) 1000 MG CAPS Take 1,000 mg by mouth at bedtime.   . Polyvinyl Alcohol-Povidone (REFRESH OP) Place 1 drop into both eyes daily as needed (dry eyes).  . tadalafil (CIALIS) 5 MG tablet Take 1 tablet (5 mg total) by mouth daily.  . tamsulosin (FLOMAX) 0.4 MG CAPS capsule Take 1 capsule (0.4 mg total) by mouth 2 (two) times daily.  . Testosterone 30 MG/ACT SOLN APPLY 1-2 PUMPS DAILY. ALTERNATE TAKING ONE PUMP UNDER ONE ARM ONE DAY, TWO PUMPS THE NEXT DAY (ONE PUMP UNDER EACH ARM)  . tiotropium (SPIRIVA HANDIHALER) 18 MCG inhalation capsule Place 1 capsule (18 mcg total) into inhaler and inhale daily.  . vitamin C (ASCORBIC ACID) 500 MG tablet Take 500 mg by mouth daily.  . [DISCONTINUED] glimepiride (AMARYL) 2 MG tablet Take 1 tablet (2 mg total) by mouth daily.    Allergies  Allergen Reactions  . Methotrexate Derivatives Nausea And Vomiting and Other (See Comments)     Hallucinations and aggressive behavior    . Tramadol Nausea And Vomiting    Hallucinations and aggressive behavior        Review of Systems:  Constitutional: No recent illness  HEENT: No  headache, no vision change  Cardiac: No  chest pain, No  pressure, No palpitations  Respiratory:  No  shortness of breath. No  Cough  Gastrointestinal: No  abdominal pain, no change on bowel habits  Musculoskeletal: No new myalgia/arthralgia  Skin: No  Rash  Hem/Onc: No  easy bruising/bleeding, No  abnormal lumps/bumps  Neurologic: No  weakness, No  Dizziness  Psychiatric: No  concerns with depression, No  concerns with anxiety  Exam:  BP (!) 152/80 (BP Location: Left Arm, Patient Position: Sitting, Cuff Size: Normal)   Pulse 84   Temp 97.9 F (36.6 C) (  Oral)   Wt 192 lb 6.4 oz (87.3 kg)   BMI 25.38 kg/m   Constitutional: VS see above. General Appearance: alert, well-developed, well-nourished, NAD  Eyes: Normal lids and conjunctive, non-icteric sclera  Ears, Nose, Mouth, Throat: MMM, Normal external inspection ears/nares/mouth/lips/gums.  Neck: No masses, trachea midline.   Respiratory: Normal respiratory effort. no wheeze, no rhonchi, no rales  Cardiovascular: S1/S2 normal, no murmur, no rub/gallop auscultated. RRR.   Musculoskeletal: Gait normal. Symmetric and independent movement of all extremities  Abdominal: non-tender, non-distended, no appreciable organomegaly, neg Murphy's, BS WNLx4  Neurological: Normal balance/coordination. No tremor.  Skin: warm, dry, intact.   Psychiatric: Normal judgment/insight. Normal mood and affect. Oriented x3.       Visit summary with medication list and pertinent instructions was printed for patient to review, patient was advised to alert [place] if any updates are needed. All questions at time of visit were answered - patient instructed to contact office with any additional concerns. ER/RTC precautions were reviewed with the patient  and understanding verbalized.   Note: Total time spent 25 minutes, greater than 50% of the visit was spent face-to-face counseling and coordinating care for the following: The primary encounter diagnosis was Uncontrolled type 2 diabetes mellitus with hypoglycemia without coma ([place]). Diagnoses of Need for influenza vaccination and Essential hypertension were also pertinent to this visit.Marland Kitchen  Please note: voice recognition software was used to produce this document, and typos may escape review. Please contact Dr. Sheppard Coil for any needed clarifications.    Follow up plan: Return for Visit verify home blood pressure monitor sometime in the next week or 2.  Follow up A1c 3 months.

## 2018-11-14 NOTE — Patient Instructions (Addendum)
Sugars: With sugars dropping, please be sure you are eating regular meals but the glimepiride might also be to blame here.  Let us decrease from 2 mg to 1 mg, can try cutting pills in half, I have sent a different prescription with the lower dose into the pharmacy for you.  Blood pressure: Goal is 130 top number, 80 bottom number or less than this pretty consistently.  Would recommend using a home blood pressure cuff that goes around the arm, not the wrist.  Let us have you scheduled for a nurse to check your home blood pressure monitor against our equipment to verify its accuracy.  If blood pressures continue to be above goal, we may consider increasing your medications.

## 2018-11-20 DIAGNOSIS — N401 Enlarged prostate with lower urinary tract symptoms: Secondary | ICD-10-CM | POA: Diagnosis not present

## 2018-11-20 DIAGNOSIS — R3914 Feeling of incomplete bladder emptying: Secondary | ICD-10-CM | POA: Diagnosis not present

## 2018-11-21 ENCOUNTER — Other Ambulatory Visit: Payer: Self-pay

## 2018-11-21 ENCOUNTER — Ambulatory Visit (INDEPENDENT_AMBULATORY_CARE_PROVIDER_SITE_OTHER): Payer: Medicare Other | Admitting: Osteopathic Medicine

## 2018-11-21 VITALS — BP 156/67 | HR 74

## 2018-11-21 DIAGNOSIS — I10 Essential (primary) hypertension: Secondary | ICD-10-CM | POA: Diagnosis not present

## 2018-11-21 NOTE — Progress Notes (Signed)
Pt came into clinic today for BP check. Pt did bring his home machine to check accuracy.   Our machine: 156/67, pulse 74. 136/71, pulse 81. Home machine: 156/79, pulse 81.  Did let Pt sit between BP checks. Reports taking his lisinopril at night.

## 2018-11-22 DIAGNOSIS — N401 Enlarged prostate with lower urinary tract symptoms: Secondary | ICD-10-CM | POA: Diagnosis not present

## 2018-11-24 DIAGNOSIS — Z20828 Contact with and (suspected) exposure to other viral communicable diseases: Secondary | ICD-10-CM | POA: Diagnosis not present

## 2018-11-24 DIAGNOSIS — Z961 Presence of intraocular lens: Secondary | ICD-10-CM | POA: Diagnosis not present

## 2018-11-24 DIAGNOSIS — H25812 Combined forms of age-related cataract, left eye: Secondary | ICD-10-CM | POA: Diagnosis not present

## 2018-11-24 DIAGNOSIS — Z01812 Encounter for preprocedural laboratory examination: Secondary | ICD-10-CM | POA: Diagnosis not present

## 2018-11-24 NOTE — Progress Notes (Signed)
BP Readings from Last 3 Encounters:  11/21/18 (!) 156/67  11/14/18 (!) 152/80  08/22/18 (!) 164/78   If the measurement taken on our machine of the 156/67 was taken fairly close in time to his home machine of 156/79, I would have to say that his is off by greater than 10 points diastolic so I would not totally trust its measurements. But, If he is consistently getting blood pressures though in the 130s over 80s at home, I do not think we need to adjust medications.

## 2018-11-28 DIAGNOSIS — Z961 Presence of intraocular lens: Secondary | ICD-10-CM | POA: Diagnosis not present

## 2018-11-28 DIAGNOSIS — E1136 Type 2 diabetes mellitus with diabetic cataract: Secondary | ICD-10-CM | POA: Diagnosis not present

## 2018-11-28 DIAGNOSIS — Z888 Allergy status to other drugs, medicaments and biological substances status: Secondary | ICD-10-CM | POA: Diagnosis not present

## 2018-11-28 DIAGNOSIS — Z9842 Cataract extraction status, left eye: Secondary | ICD-10-CM | POA: Diagnosis not present

## 2018-11-28 DIAGNOSIS — H269 Unspecified cataract: Secondary | ICD-10-CM | POA: Diagnosis not present

## 2018-11-28 DIAGNOSIS — J449 Chronic obstructive pulmonary disease, unspecified: Secondary | ICD-10-CM | POA: Diagnosis not present

## 2018-11-28 DIAGNOSIS — H2181 Floppy iris syndrome: Secondary | ICD-10-CM | POA: Diagnosis not present

## 2018-11-28 DIAGNOSIS — H35033 Hypertensive retinopathy, bilateral: Secondary | ICD-10-CM | POA: Diagnosis not present

## 2018-11-28 DIAGNOSIS — H02834 Dermatochalasis of left upper eyelid: Secondary | ICD-10-CM | POA: Diagnosis not present

## 2018-11-28 DIAGNOSIS — H02831 Dermatochalasis of right upper eyelid: Secondary | ICD-10-CM | POA: Diagnosis not present

## 2018-11-28 DIAGNOSIS — N400 Enlarged prostate without lower urinary tract symptoms: Secondary | ICD-10-CM | POA: Diagnosis not present

## 2018-11-28 DIAGNOSIS — H25812 Combined forms of age-related cataract, left eye: Secondary | ICD-10-CM | POA: Diagnosis not present

## 2018-11-28 DIAGNOSIS — Z79899 Other long term (current) drug therapy: Secondary | ICD-10-CM | POA: Diagnosis not present

## 2018-11-28 NOTE — Progress Notes (Signed)
Pt advised. BP readings at home are usually in the 120s/80s so he will monitor and notify if they increase.

## 2018-11-29 DIAGNOSIS — H43813 Vitreous degeneration, bilateral: Secondary | ICD-10-CM | POA: Diagnosis not present

## 2018-11-29 DIAGNOSIS — H02831 Dermatochalasis of right upper eyelid: Secondary | ICD-10-CM | POA: Diagnosis not present

## 2018-11-29 DIAGNOSIS — H527 Unspecified disorder of refraction: Secondary | ICD-10-CM | POA: Diagnosis not present

## 2018-11-29 DIAGNOSIS — H31001 Unspecified chorioretinal scars, right eye: Secondary | ICD-10-CM | POA: Diagnosis not present

## 2018-11-29 DIAGNOSIS — E119 Type 2 diabetes mellitus without complications: Secondary | ICD-10-CM | POA: Diagnosis not present

## 2018-11-29 DIAGNOSIS — Z961 Presence of intraocular lens: Secondary | ICD-10-CM | POA: Diagnosis not present

## 2018-11-29 DIAGNOSIS — H35033 Hypertensive retinopathy, bilateral: Secondary | ICD-10-CM | POA: Diagnosis not present

## 2018-11-29 DIAGNOSIS — H02834 Dermatochalasis of left upper eyelid: Secondary | ICD-10-CM | POA: Diagnosis not present

## 2018-11-30 ENCOUNTER — Other Ambulatory Visit: Payer: Self-pay | Admitting: Osteopathic Medicine

## 2018-12-14 ENCOUNTER — Other Ambulatory Visit: Payer: Self-pay | Admitting: Osteopathic Medicine

## 2018-12-25 DIAGNOSIS — N401 Enlarged prostate with lower urinary tract symptoms: Secondary | ICD-10-CM | POA: Diagnosis not present

## 2018-12-25 DIAGNOSIS — R3912 Poor urinary stream: Secondary | ICD-10-CM | POA: Diagnosis not present

## 2019-01-17 ENCOUNTER — Other Ambulatory Visit: Payer: Self-pay | Admitting: Gastroenterology

## 2019-02-02 DIAGNOSIS — L814 Other melanin hyperpigmentation: Secondary | ICD-10-CM | POA: Diagnosis not present

## 2019-02-02 DIAGNOSIS — D225 Melanocytic nevi of trunk: Secondary | ICD-10-CM | POA: Diagnosis not present

## 2019-02-02 DIAGNOSIS — L820 Inflamed seborrheic keratosis: Secondary | ICD-10-CM | POA: Diagnosis not present

## 2019-02-02 DIAGNOSIS — L570 Actinic keratosis: Secondary | ICD-10-CM | POA: Diagnosis not present

## 2019-02-02 DIAGNOSIS — L2089 Other atopic dermatitis: Secondary | ICD-10-CM | POA: Diagnosis not present

## 2019-02-02 DIAGNOSIS — Z85828 Personal history of other malignant neoplasm of skin: Secondary | ICD-10-CM | POA: Diagnosis not present

## 2019-02-02 DIAGNOSIS — D0462 Carcinoma in situ of skin of left upper limb, including shoulder: Secondary | ICD-10-CM | POA: Diagnosis not present

## 2019-02-02 DIAGNOSIS — Z85820 Personal history of malignant melanoma of skin: Secondary | ICD-10-CM | POA: Diagnosis not present

## 2019-02-07 DIAGNOSIS — R350 Frequency of micturition: Secondary | ICD-10-CM | POA: Diagnosis not present

## 2019-02-07 DIAGNOSIS — R3914 Feeling of incomplete bladder emptying: Secondary | ICD-10-CM | POA: Diagnosis not present

## 2019-02-07 DIAGNOSIS — N401 Enlarged prostate with lower urinary tract symptoms: Secondary | ICD-10-CM | POA: Diagnosis not present

## 2019-02-19 ENCOUNTER — Encounter: Payer: Self-pay | Admitting: Osteopathic Medicine

## 2019-02-19 ENCOUNTER — Ambulatory Visit (INDEPENDENT_AMBULATORY_CARE_PROVIDER_SITE_OTHER): Payer: Medicare Other | Admitting: Osteopathic Medicine

## 2019-02-19 ENCOUNTER — Other Ambulatory Visit: Payer: Self-pay

## 2019-02-19 VITALS — BP 171/77 | HR 85 | Temp 98.0°F | Wt 198.0 lb

## 2019-02-19 DIAGNOSIS — E1169 Type 2 diabetes mellitus with other specified complication: Secondary | ICD-10-CM

## 2019-02-19 DIAGNOSIS — I10 Essential (primary) hypertension: Secondary | ICD-10-CM

## 2019-02-19 DIAGNOSIS — Z23 Encounter for immunization: Secondary | ICD-10-CM | POA: Diagnosis not present

## 2019-02-19 LAB — POCT GLYCOSYLATED HEMOGLOBIN (HGB A1C): Hemoglobin A1C: 6.2 % — AB (ref 4.0–5.6)

## 2019-02-19 MED ORDER — ZOSTER VAC RECOMB ADJUVANTED 50 MCG/0.5ML IM SUSR: 0.5000 mL | Freq: Once | INTRAMUSCULAR | 1 refills | Status: AC

## 2019-02-19 NOTE — Progress Notes (Signed)
HPI: Frederick Guzman is a 68 y.o. male who  has a past medical history of Allergy, Arthritis, Asthma, Cancer ([place]) (08/2014), COPD (chronic obstructive pulmonary disease) ([place]), Depression, Diabetes mellitus without complication ([place]), Diverticulitis, Dyspnea, Fibromyalgia, GERD (gastroesophageal reflux disease), Hyperlipemia, Hypertension, Lymphocytic colitis (04/2018), Pneumonia (06/2015, 10/2016), and Sleep apnea.  he presents to [facility] today, 02/19/19,  for chief complaint of:  DM2 follow-up HTN follow-up   Our machine: 156/67, pulse 74. 136/71, pulse 81. Home machine: 156/79, pulse 81. his is off by greater than 10 points diastolic so I would not totally trust its measurements. But, If he is consistently getting blood pressures though in the 130s over 80s at home, I do not think we need to adjust medications.  BP Readings from Last 3 Encounters:  11/21/18 (!) 156/67  11/14/18 (!) 152/80  08/22/18 (!) 164/78    DM2: Last A1C 10/2018 was 6.2, today same. Taking only glimepiride, no significant hypoglycemia though occasionally he feels tired w/ Glc in 80s/90s. He has occasional dizziness he associates with not eating, and he has noted improvement in dizziness (less frequent) since stopping Fomax.     At today's visit 02/19/19 ... PMH, PSH, FH reviewed and updated as needed.  Current medication list and allergy/intolerance hx reviewed and updated as needed. (See remainder of HPI, ROS, Phys Exam below)   No results found.  Results for orders placed or performed in visit on 02/19/19 (from the past 72 hour(s))  POCT HgB A1C     Status: Abnormal   Collection Time: 02/19/19  9:01 AM  Result Value Ref Range   Hemoglobin A1C 6.2 (A) 4.0 - 5.6 %   HbA1c POC (<> result, manual entry)     HbA1c, POC (prediabetic range)     HbA1c, POC (controlled diabetic range)            ASSESSMENT/PLAN: The primary encounter diagnosis was Type 2  diabetes mellitus with other specified complication, without long-term current use of insulin ([place]). Diagnoses of Need for shingles vaccine, Essential hypertension, and White coat syndrome with diagnosis of hypertension were also pertinent to this visit.   I'd be ok to stop the Glimepiride at some oint since A1C have been pretty good, caution w/ mild hypoglycemia    Orders Placed This Encounter  Procedures  . POCT HgB A1C     Meds ordered this encounter  Medications  . Zoster Vaccine Adjuvanted [facility]) injection    Sig: Inject 0.5 mLs into the muscle once for 1 dose. Repeat in 2-6 months. Please fax confirmation of vaccination to Dr Sheppard Coil [phone]    Dispense:  0.5 mL    Refill:  1    There are no Patient Instructions on file for this visit.    Follow-up plan: Return for 3-4 months recheck A1C, see me sooner if needed! .                                                 ################################################# ################################################# ################################################# #################################################    Current Meds  Medication Sig  . albuterol (PROAIR HFA) 108 (90 Base) MCG/ACT inhaler Inhale 2 puffs into the lungs every 6 (six) hours as needed for wheezing or shortness of breath.  Marland Kitchen albuterol (PROVENTIL) (2.5 MG/3ML) 0.083% nebulizer solution Take 2.5 mg by nebulization every 6 (six) hours as needed  for wheezing or shortness of breath.  Marland Kitchen atorvastatin (LIPITOR) 20 MG tablet Take 1 tablet (20 mg total) by mouth every other day.  . b complex vitamins tablet Take 1 tablet by mouth daily.  . budesonide (ENTOCORT EC) 3 MG 24 hr capsule Take 3 capsules (9 mg total) by mouth daily.  . budesonide-formoterol (SYMBICORT) 160-4.5 MCG/ACT inhaler Inhale 2 puffs into the lungs 2 (two) times daily.  Marland Kitchen buPROPion (WELLBUTRIN XL) 150 MG 24 hr tablet Take 1 tablet (150 mg  total) by mouth every morning. To add to 300 mg daily for total of 450 mg p.o. daily  . cetirizine (ZYRTEC) 10 MG tablet Take 10 mg by mouth daily as needed for allergies.   . diphenoxylate-atropine (LOMOTIL) 2.5-0.025 MG tablet Take 1 tablet by mouth 4 (four) times daily as needed for diarrhea or loose stools.  Marland Kitchen esomeprazole (NEXIUM) 40 MG capsule Take 1 capsule (40 mg total) by mouth daily.  . fluticasone (FLONASE) 50 MCG/ACT nasal spray Place 1 spray into both nostrils daily as needed for allergies.  . furosemide (LASIX) 20 MG tablet Take 1 tablet (20 mg total) by mouth 2 (two) times daily as needed for edema.  Marland Kitchen glimepiride (AMARYL) 1 MG tablet Take 1 tablet (1 mg total) by mouth daily with breakfast.  . glucose blood test strip Use up to 4 times per day as directed with glucometer. Disp: 100. Refill x99  . lisinopril (ZESTRIL) 2.5 MG tablet Take 1 tablet (2.5 mg total) by mouth at bedtime.  . meloxicam (MOBIC) 15 MG tablet Take 15 mg by mouth daily.  . Multiple Vitamin (MULTIVITAMIN WITH MINERALS) TABS tablet Take 1 tablet by mouth at bedtime.  . Omega-3 Fatty Acids (FISH OIL) 1000 MG CAPS Take 1,000 mg by mouth at bedtime.   . Polyvinyl Alcohol-Povidone (REFRESH OP) Place 1 drop into both eyes daily as needed (dry eyes).  . tacrolimus (PROTOPIC) 0.1 % ointment Apply 1 application topically daily.  . tadalafil (CIALIS) 5 MG tablet Take 1 tablet (5 mg total) by mouth daily.  . Testosterone 30 MG/ACT SOLN APPLY 1-2 PUMPS DAILY. ALTERNATE TAKING ONE PUMP UNDER ONE ARM ONE DAY, TWO PUMPS THE NEXT DAY (ONE PUMP UNDER EACH ARM)  . vitamin C (ASCORBIC ACID) 500 MG tablet Take 500 mg by mouth daily.    Allergies  Allergen Reactions  . Methotrexate Derivatives Nausea And Vomiting and Other (See Comments)    Hallucinations and aggressive behavior    . Tramadol Nausea And Vomiting    Hallucinations and aggressive behavior      Immunization History  Administered Date(s) Administered  . Fluad  Quad(high Dose 65+) 11/14/2018  . Influenza, High Dose Seasonal PF 12/15/2016, 01/19/2018  . Influenza, Seasonal, Injecte, Preservative Fre 11/27/2015  . Influenza-Unspecified 02/14/2012, 12/27/2012  . Pneumococcal Conjugate-13 03/18/2015  . Pneumococcal Polysaccharide-23 01/19/2008, 12/15/2016  . Tdap 05/31/2011      Review of Systems:  Constitutional: No recent illness  HEENT: No  headache, no vision change  Cardiac: No  chest pain, No  pressure, No palpitations  Respiratory:  No  shortness of breath. No  Cough  Gastrointestinal: No  abdominal pain,  Musculoskeletal: No new myalgia/arthralgia  Neurologic: No  weakness, +occasional Dizziness  Psychiatric: No  concerns with depression, No  concerns with anxiety  Exam:  BP (!) 171/77   Pulse 85   Temp 98 F (36.7 C)   Wt 198 lb (89.8 kg)   BMI 26.12 kg/m   Constitutional: VS see  above. General Appearance: alert, well-developed, well-nourished, NAD  Eyes: Normal lids and conjunctive, non-icteric sclera  Neck: No masses, trachea midline.   Respiratory: Normal respiratory effort. no wheeze, no rhonchi, no rales  Cardiovascular: S1/S2 normal, no murmur, no rub/gallop auscultated. RRR.   Musculoskeletal: Gait normal. Symmetric and independent movement of all extremities  Neurological: Normal balance/coordination. No tremor.  Skin: warm, dry, intact.   Psychiatric: Normal judgment/insight. Normal mood and affect. Oriented x3.       Visit summary with medication list and pertinent instructions was printed for patient to review, patient was advised to alert [place] if any updates are needed. All questions at time of visit were answered - patient instructed to contact office with any additional concerns. ER/RTC precautions were reviewed with the patient and understanding verbalized.   Note: Total time spent 25 minutes, greater than 50% of the visit was spent face-to-face counseling and coordinating care for the  following: The primary encounter diagnosis was Type 2 diabetes mellitus with other specified complication, without long-term current use of insulin ([place]). Diagnoses of Need for shingles vaccine, Essential hypertension, and White coat syndrome with diagnosis of hypertension were also pertinent to this visit.Marland Kitchen  Please note: voice recognition software was used to produce this document, and typos may escape review. Please contact Dr. Sheppard Coil for any needed clarifications.    Follow up plan: Return for 3-4 months recheck A1C, see me sooner if needed! Marland Kitchen

## 2019-03-08 ENCOUNTER — Other Ambulatory Visit: Payer: Self-pay | Admitting: Osteopathic Medicine

## 2019-03-08 NOTE — Telephone Encounter (Signed)
Requested medication (s) are due for refill today: yes  Requested medication (s) are on the active medication list: yes  Last refill:  12/18/2018  Future visit scheduled: yes  Notes to clinic:  review for refill   Requested Prescriptions  Pending Prescriptions Disp Refills   esomeprazole (NEXIUM) 40 MG capsule [Pharmacy Med Name: esomeprazole magnesium 40 mg capsule,delayed release] 90 capsule 1    Sig: Take 1 capsule (40 mg total) by mouth daily.      Gastroenterology: Proton Pump Inhibitors Passed - 03/08/2019  8:00 AM      Passed - Valid encounter within last 12 months    Recent Outpatient Visits           2 weeks ago Type 2 diabetes mellitus with other specified complication, without long-term current use of insulin ([place])   [facility], Lanelle Bal, DO   3 months ago Essential hypertension   [facility], Lanelle Bal, DO   3 months ago Uncontrolled type 2 diabetes mellitus with hypoglycemia without coma [facility])   [place], Lanelle Bal, DO   6 months ago Lower extremity edema   [facility], Lanelle Bal, DO   8 months ago Diabetes mellitus without complication [facility])   [place], Lanelle Bal, DO       Future Appointments             In 3 months [name], [place]

## 2019-04-03 ENCOUNTER — Telehealth: Payer: Self-pay | Admitting: Osteopathic Medicine

## 2019-04-03 ENCOUNTER — Telehealth: Payer: Self-pay

## 2019-04-03 NOTE — Telephone Encounter (Signed)
Patient reports he was exposed over 9 days ago and reports " I am not feeling well and just want to be teste td for peace of mind. I was exposed by my preacher who is in intensive care at this time". He just wanted testing. He was given the information to [facility] care. He will check around and get tested. No other questions at this time.

## 2019-04-03 NOTE — Telephone Encounter (Signed)
Pt left a vm msg stating he was exposed to someone who was positive for Covid. The person who has Covid is now in the hospital. Pt believes he was expose to Covid 9 days ago and is now having Covid like symptoms. Wants feedback from provider.  Pls advise, thanks.

## 2019-04-04 NOTE — Telephone Encounter (Signed)
Called patient, he states he found a site to be tested and is going to get tested. States he isn't "sick-sick" but doesn't feel wel.. he did not discuss his symptoms.   Advised patient to quarantine until he receives results. He is aware and will call to make virtual appointment after being tested if needed.

## 2019-04-16 ENCOUNTER — Other Ambulatory Visit: Payer: Self-pay | Admitting: Osteopathic Medicine

## 2019-04-20 ENCOUNTER — Telehealth: Payer: Self-pay

## 2019-04-20 MED ORDER — BUPROPION HCL ER (XL) 300 MG PO TB24: 300.0000 mg | ORAL_TABLET | Freq: Every day | ORAL | 1 refills | Status: DC

## 2019-04-20 NOTE — Telephone Encounter (Signed)
Pt's wife called stating that pt was informed by [facility] that Wellbutrin 300 mg rx was discontinued by PCP. As per Lelon Frohlich, pt is still taking this medication. The rx that was stopped was Wellbutrin 150 mg. Requesting a med refill for 300 mg. Rx not listed in active med list. Pls advise, thanks.

## 2019-04-24 ENCOUNTER — Encounter: Payer: Self-pay | Admitting: Nurse Practitioner

## 2019-04-24 ENCOUNTER — Ambulatory Visit (INDEPENDENT_AMBULATORY_CARE_PROVIDER_SITE_OTHER): Payer: Medicare Other | Admitting: Nurse Practitioner

## 2019-04-24 DIAGNOSIS — B370 Candidal stomatitis: Secondary | ICD-10-CM | POA: Diagnosis not present

## 2019-04-24 MED ORDER — NYSTATIN 100000 UNIT/ML MT SUSP: 5.0000 mL | Freq: Four times a day (QID) | OROMUCOSAL | 0 refills | Status: DC

## 2019-04-24 MED ORDER — FLUCONAZOLE 200 MG PO TABS: 200.0000 mg | ORAL_TABLET | Freq: Once | ORAL | 0 refills | Status: AC

## 2019-04-24 NOTE — Patient Instructions (Signed)
Oral Thrush, Adult  Oral thrush is an infection in your mouth and throat. It causes white patches on your tongue and in your mouth. Follow these instructions at home: Helping with soreness   To lessen your pain: ? Drink cold liquids, like water and iced tea. ? Eat frozen ice pops or frozen juices. ? Eat foods that are easy to swallow, like gelatin and ice cream. ? Drink from a straw if the patches in your mouth are painful. General instructions  Take or use over-the-counter and prescription medicines only as told by your doctor. Medicine for oral thrush may be something to swallow, or it may be something to put on the infected area.  Eat plain yogurt that has live cultures in it. Read the label to make sure.  If you wear dentures: ? Take out your dentures before you go to bed. ? Brush them well. ? Soak them in a denture cleaner.  Rinse your mouth with warm salt-water many times a day. To make the salt-water mixture, completely dissolve 1/2-1 teaspoon of salt in 1 cup of warm water. Contact a doctor if:  Your problems are getting worse.  Your problems do not get better in less than 7 days with treatment.  Your infection is spreading. This may show as white patches on the skin outside of your mouth. This information is not intended to replace advice given to you by your health care provider. Make sure you discuss any questions you have with your health care provider. Document Revised: 06/10/2017 Document Reviewed: 12/01/2015 Elsevier Patient Education  [place].

## 2019-04-24 NOTE — Progress Notes (Signed)
Virtual Visit via Telephone Note  I connected with Frederick Guzman on 04/24/19 at  4:20 PM EST by telephone and verified that I am speaking with the correct person using two identifiers.   I discussed the limitations, risks, security and privacy concerns of performing an evaluation and management service by telephone and the availability of in person appointments. I also discussed with the patient that there may be a patient responsible charge related to this service. The patient expressed understanding and agreed to proceed.   Subjective:    CC: sore tongue  HPI: Frederick Guzman is a 69 year old male presenting today via telephone visit for sore tongue for approximately 1 month.  He reports his tongue is coated in "splotchy" white spots and he reports a bad taste in his mouth.  He denies visible or open sores in the mouth.  He reports he did speak to his pharmacist and they told him it might be thrush but to try an over-the-counter mouthwash with peroxide for mouth sores and to gargle and salt water and if that did not help to contact his primary care provider.  The patient has been using the mouthwashes for a week now with no improvement of symptoms.  Patient is a diabetic and does use steroid inhalers daily for asthma.  He reports he tries to rinse his mouth out after every use of the inhaler.    Past medical history, Surgical history, Family history not pertinant except as noted below, Social history, Allergies, and medications have been entered into the medical record, reviewed, and corrections made.   Review of Systems:  No fevers, chills, night sweats, weight loss, chest pain, or shortness of breath.  No cough, difficulty swallowing, or swelling of the tongue, throat, or mouth.   Objective:    General: Speaking clearly in complete sentences without any shortness of breath.  Alert and oriented x3.  Normal judgment. No apparent acute distress.   Impression and Recommendations:     1. Oral pharyngeal candidiasis Presentation and symptoms most correlate with oral pharyngeal candidiasis, most likely exacerbated by the setting of chronic inhaled corticosteroids and diabetes. Discussed with patient the importance of rinsing his mouth after each use of his steroid inhalers and blood glucose control. Prescription for nystatin swish and swallow for 1 week and oral fluconazole for 1 dose sent to pharmacy.  Patient educated on how to properly use swish and swallow. Patient to follow-up if symptoms worsen or fail to improve after completion of medication. - nystatin (MYCOSTATIN) 100000 UNIT/ML suspension; Take 5 mLs (500,000 Units total) by mouth 4 (four) times daily. X 1 week. Swish and hold in mouth for at least 2 minutes and then swallow.  Dispense: 180 mL; Refill: 0 - fluconazole (DIFLUCAN) 200 MG tablet; Take 1 tablet (200 mg total) by mouth once for 1 dose.  Dispense: 1 tablet; Refill: 0     I discussed the assessment and treatment plan with the patient. The patient was provided an opportunity to ask questions and all were answered. The patient agreed with the plan and demonstrated an understanding of the instructions.   The patient was advised to call back or seek an in-person evaluation if the symptoms worsen or if the condition fails to improve as anticipated.     Orma Render, NP

## 2019-05-17 DIAGNOSIS — Z720 Tobacco use: Secondary | ICD-10-CM | POA: Diagnosis not present

## 2019-05-17 DIAGNOSIS — R911 Solitary pulmonary nodule: Secondary | ICD-10-CM | POA: Diagnosis not present

## 2019-05-17 DIAGNOSIS — G4733 Obstructive sleep apnea (adult) (pediatric): Secondary | ICD-10-CM | POA: Diagnosis not present

## 2019-05-17 DIAGNOSIS — F17210 Nicotine dependence, cigarettes, uncomplicated: Secondary | ICD-10-CM | POA: Diagnosis not present

## 2019-05-17 DIAGNOSIS — J449 Chronic obstructive pulmonary disease, unspecified: Secondary | ICD-10-CM | POA: Diagnosis not present

## 2019-06-04 DIAGNOSIS — G4733 Obstructive sleep apnea (adult) (pediatric): Secondary | ICD-10-CM | POA: Diagnosis not present

## 2019-06-04 DIAGNOSIS — J449 Chronic obstructive pulmonary disease, unspecified: Secondary | ICD-10-CM | POA: Diagnosis not present

## 2019-06-11 ENCOUNTER — Other Ambulatory Visit: Payer: Self-pay | Admitting: Osteopathic Medicine

## 2019-06-11 DIAGNOSIS — H43813 Vitreous degeneration, bilateral: Secondary | ICD-10-CM | POA: Diagnosis not present

## 2019-06-20 ENCOUNTER — Encounter: Payer: Self-pay | Admitting: Osteopathic Medicine

## 2019-06-20 ENCOUNTER — Other Ambulatory Visit: Payer: Self-pay

## 2019-06-20 ENCOUNTER — Ambulatory Visit (INDEPENDENT_AMBULATORY_CARE_PROVIDER_SITE_OTHER): Payer: Medicare Other | Admitting: Osteopathic Medicine

## 2019-06-20 VITALS — BP 151/88 | HR 70 | Temp 97.6°F | Wt 199.0 lb

## 2019-06-20 DIAGNOSIS — E1169 Type 2 diabetes mellitus with other specified complication: Secondary | ICD-10-CM

## 2019-06-20 LAB — POCT GLYCOSYLATED HEMOGLOBIN (HGB A1C): Hemoglobin A1C: 6.8 % — AB (ref 4.0–5.6)

## 2019-06-20 MED ORDER — TRELEGY ELLIPTA 200-62.5-25 MCG/INH IN AEPB: 1.0000 | INHALATION_SPRAY | Freq: Every day | RESPIRATORY_TRACT | 11 refills | Status: DC

## 2019-06-20 NOTE — Progress Notes (Signed)
Frederick Guzman is a 69 y.o. male who presents to  [facility]  today, 06/20/19, seeking care for the following: . DM2     ASSESSMENT & PLAN with other pertinent history/findings:  The encounter diagnosis was Type 2 diabetes mellitus with other specified complication, without long-term current use of insulin ([place]).     DIABETES SCREENING/PREVENTIVE CARE:  Medications as of 06/20/19, --> changes made 06/20/19: none . Metformin: intolerance . Other antihyperglycemics: glimepiride 1 mg daily  . ACE/ARB: Lisinopril 2.5 mg daily o (Microalbumin needed? n/a) . Antiplatelet if ASCVD Risk >10%: recommended o The ASCVD Risk score [name], et al., 2013) failed to calculate for the following reasons: o   The valid total cholesterol range is 130 to 320 mg/dL . Statin: Atorvastatin 20 mg daily  o LDL goal <70: Yes  04/10/2018, due to recheck   A1C:  Today 06/20/19: 6.8  Last visit 02/19/19: 6.2  BP goal <130/80: white coat  BP Readings from Last 3 Encounters:  06/20/19 (!) 151/88  02/19/19 (!) 171/77  11/21/18 (!) 156/67   Wt Readings from Last 3 Encounters:  06/20/19 199 lb (90.3 kg)  02/19/19 198 lb (89.8 kg)  11/14/18 192 lb 6.4 oz (87.3 kg)   Eye exam annually: 09/2018, importance discussed with patient Foot exam: due  Pneumovax:: done Immunization History  Administered Date(s) Administered  . Fluad Quad(high Dose 65+) 11/14/2018  . Influenza, High Dose Seasonal PF 12/15/2016, 01/19/2018  . Influenza, Seasonal, Injecte, Preservative Fre 11/27/2015  . Influenza-Unspecified 02/14/2012, 12/27/2012  . PFIZER SARS-COV-2 Vaccination 04/13/2019, 05/04/2019  . Pneumococcal Conjugate-13 03/18/2015  . Pneumococcal Polysaccharide-23 01/19/2008, 12/15/2016  . Tdap 05/31/2011      There are no Patient Instructions on file for this visit.   Orders Placed This Encounter  Procedures  . POCT HgB A1C    Meds  ordered this encounter  Medications  . Fluticasone-Umeclidin-Vilant (TRELEGY ELLIPTA) 200-62.5-25 MCG/INH AEPB    Sig: Inhale 1 puff into the lungs daily.    Dispense:  60 each    Refill:  11       Follow-up instructions: Return in about 3 months (around 09/19/2019) for recheck A1C .                                         BP (!) 151/88 (BP Location: Left Arm, Patient Position: Sitting, Cuff Size: Normal)   Pulse 70   Temp 97.6 F (36.4 C) (Oral)   Wt 199 lb (90.3 kg)   BMI 26.25 kg/m   No outpatient medications have been marked as taking for the 06/20/19 encounter (Office Visit) with [name], DO.    Results for orders placed or performed in visit on 06/20/19 (from the past 72 hour(s))  POCT HgB A1C     Status: Abnormal   Collection Time: 06/20/19  9:06 AM  Result Value Ref Range   Hemoglobin A1C 6.8 (A) 4.0 - 5.6 %   HbA1c POC (<> result, manual entry)     HbA1c, POC (prediabetic range)     HbA1c, POC (controlled diabetic range)      No results found.  Depression screen [facility] 2/9 06/26/2018 10/17/2017 01/03/2017  Decreased Interest 0 0 0  Down, Depressed, Hopeless 0 1 1  PHQ - 2 Score 0 1 1  Altered sleeping - 1 -  Tired, decreased energy - 2 -  Change in appetite - 0 -  Feeling bad or failure about yourself  - 0 -  Trouble concentrating - 0 -  Moving slowly or fidgety/restless - 0 -  Suicidal thoughts - 0 -  PHQ-9 Score - 4 -  Difficult doing work/chores - Somewhat difficult -    GAD 7 : Generalized Anxiety Score 10/17/2017  Nervous, Anxious, on Edge 2  Control/stop worrying 2  Worry too much - different things 2  Trouble relaxing 2  Restless 0  Easily annoyed or irritable 3  Afraid - awful might happen 0  Total GAD 7 Score 11  Anxiety Difficulty Very difficult      All questions at time of visit were answered - patient instructed to contact office with any additional concerns or updates.  ER/RTC precautions  were reviewed with the patient.  Please note: voice recognition software was used to produce this document, and typos may escape review. Please contact Dr. Sheppard Coil for any needed clarifications.

## 2019-07-02 DIAGNOSIS — J449 Chronic obstructive pulmonary disease, unspecified: Secondary | ICD-10-CM | POA: Diagnosis not present

## 2019-07-02 DIAGNOSIS — G4733 Obstructive sleep apnea (adult) (pediatric): Secondary | ICD-10-CM | POA: Diagnosis not present

## 2019-07-03 DIAGNOSIS — H47012 Ischemic optic neuropathy, left eye: Secondary | ICD-10-CM | POA: Diagnosis not present

## 2019-07-04 DIAGNOSIS — Z01818 Encounter for other preprocedural examination: Secondary | ICD-10-CM | POA: Diagnosis not present

## 2019-07-04 DIAGNOSIS — H3322 Serous retinal detachment, left eye: Secondary | ICD-10-CM | POA: Diagnosis not present

## 2019-07-04 DIAGNOSIS — H33052 Total retinal detachment, left eye: Secondary | ICD-10-CM | POA: Diagnosis not present

## 2019-07-04 DIAGNOSIS — H33012 Retinal detachment with single break, left eye: Secondary | ICD-10-CM | POA: Diagnosis not present

## 2019-07-07 ENCOUNTER — Other Ambulatory Visit: Payer: Self-pay | Admitting: Osteopathic Medicine

## 2019-07-23 ENCOUNTER — Other Ambulatory Visit: Payer: Self-pay | Admitting: Osteopathic Medicine

## 2019-07-23 NOTE — Telephone Encounter (Signed)
Controlled medication, sending to covering provider

## 2019-07-24 ENCOUNTER — Telehealth: Payer: Self-pay

## 2019-07-24 DIAGNOSIS — R3914 Feeling of incomplete bladder emptying: Secondary | ICD-10-CM

## 2019-07-24 DIAGNOSIS — E291 Testicular hypofunction: Secondary | ICD-10-CM

## 2019-07-24 NOTE — Telephone Encounter (Signed)
Frederick Guzman called for a refill on Testosterone. He states this medication was denied. He was last seen on 06/20/2019 and he is scheduled for a follow up on 09/18/2019. He wanted to know if he could have a refill that would last until his next follow up.

## 2019-07-24 NOTE — Telephone Encounter (Signed)
He has not had a follow-up for testosterone replacement since May of last year and has had none of the required labs since January 2020.  We will need to be able to draw these labs before we can send further refills.  If he would prefer to come to the lab prior to his appointment and have these drawn, we can enter those orders.  Once results are back, if there are no concerning results, we can send in a refill.

## 2019-07-25 ENCOUNTER — Other Ambulatory Visit: Payer: Self-pay | Admitting: Osteopathic Medicine

## 2019-07-25 NOTE — Telephone Encounter (Signed)
Patient advised. He agreed to go to the lab.

## 2019-07-25 NOTE — Telephone Encounter (Signed)
Please order labs and I will contact patient.

## 2019-07-26 DIAGNOSIS — R3914 Feeling of incomplete bladder emptying: Secondary | ICD-10-CM | POA: Diagnosis not present

## 2019-07-26 DIAGNOSIS — N401 Enlarged prostate with lower urinary tract symptoms: Secondary | ICD-10-CM | POA: Diagnosis not present

## 2019-07-26 DIAGNOSIS — E291 Testicular hypofunction: Secondary | ICD-10-CM | POA: Diagnosis not present

## 2019-07-26 LAB — CBC
HCT: 44.4 % (ref 38.5–50.0)
Hemoglobin: 15.6 g/dL (ref 13.2–17.1)
MCH: 33.3 pg — ABNORMAL HIGH (ref 27.0–33.0)
MCHC: 35.1 g/dL (ref 32.0–36.0)
MCV: 94.9 fL (ref 80.0–100.0)
MPV: 9.2 fL (ref 7.5–12.5)
Platelets: 237 10*3/uL (ref 140–400)
RBC: 4.68 10*6/uL (ref 4.20–5.80)
RDW: 12.5 % (ref 11.0–15.0)
WBC: 7.4 10*3/uL (ref 3.8–10.8)

## 2019-07-26 LAB — PSA: PSA: 1.3 ng/mL (ref <=4.0)

## 2019-07-26 LAB — TESTOSTERONE: Testosterone: 688 ng/dL (ref 250–827)

## 2019-07-27 ENCOUNTER — Other Ambulatory Visit: Payer: Self-pay | Admitting: Medical-Surgical

## 2019-07-27 DIAGNOSIS — E291 Testicular hypofunction: Secondary | ICD-10-CM

## 2019-07-27 MED ORDER — TESTOSTERONE 30 MG/ACT TD SOLN: TRANSDERMAL | 0 refills | Status: DC

## 2019-07-30 ENCOUNTER — Telehealth: Payer: Self-pay | Admitting: Osteopathic Medicine

## 2019-07-30 NOTE — Telephone Encounter (Signed)
Received fax for PA on testosterone sent through cover my meds. Apolonio Schneiders took call from [facility] and they approved coverage from 07/30/19 - 07/29/20. - CF

## 2019-08-08 DIAGNOSIS — L821 Other seborrheic keratosis: Secondary | ICD-10-CM | POA: Diagnosis not present

## 2019-08-08 DIAGNOSIS — L814 Other melanin hyperpigmentation: Secondary | ICD-10-CM | POA: Diagnosis not present

## 2019-08-08 DIAGNOSIS — D2371 Other benign neoplasm of skin of right lower limb, including hip: Secondary | ICD-10-CM | POA: Diagnosis not present

## 2019-08-08 DIAGNOSIS — L218 Other seborrheic dermatitis: Secondary | ICD-10-CM | POA: Diagnosis not present

## 2019-08-08 DIAGNOSIS — L570 Actinic keratosis: Secondary | ICD-10-CM | POA: Diagnosis not present

## 2019-08-08 DIAGNOSIS — D2262 Melanocytic nevi of left upper limb, including shoulder: Secondary | ICD-10-CM | POA: Diagnosis not present

## 2019-08-08 DIAGNOSIS — Z85828 Personal history of other malignant neoplasm of skin: Secondary | ICD-10-CM | POA: Diagnosis not present

## 2019-08-08 DIAGNOSIS — Z85820 Personal history of malignant melanoma of skin: Secondary | ICD-10-CM | POA: Diagnosis not present

## 2019-08-08 DIAGNOSIS — D2372 Other benign neoplasm of skin of left lower limb, including hip: Secondary | ICD-10-CM | POA: Diagnosis not present

## 2019-08-08 DIAGNOSIS — D225 Melanocytic nevi of trunk: Secondary | ICD-10-CM | POA: Diagnosis not present

## 2019-08-15 DIAGNOSIS — H209 Unspecified iridocyclitis: Secondary | ICD-10-CM | POA: Diagnosis not present

## 2019-08-15 DIAGNOSIS — H3322 Serous retinal detachment, left eye: Secondary | ICD-10-CM | POA: Diagnosis not present

## 2019-09-03 ENCOUNTER — Other Ambulatory Visit: Payer: Self-pay | Admitting: Osteopathic Medicine

## 2019-09-06 ENCOUNTER — Other Ambulatory Visit: Payer: Self-pay

## 2019-09-06 MED ORDER — LISINOPRIL 2.5 MG PO TABS: 2.5000 mg | ORAL_TABLET | Freq: Every day | ORAL | 1 refills | Status: DC

## 2019-09-07 ENCOUNTER — Other Ambulatory Visit: Payer: Self-pay | Admitting: Osteopathic Medicine

## 2019-09-07 ENCOUNTER — Other Ambulatory Visit: Payer: Self-pay | Admitting: Medical-Surgical

## 2019-09-07 DIAGNOSIS — E291 Testicular hypofunction: Secondary | ICD-10-CM

## 2019-09-07 NOTE — Telephone Encounter (Signed)
Routing to PCP

## 2019-09-13 ENCOUNTER — Other Ambulatory Visit: Payer: Self-pay

## 2019-09-13 ENCOUNTER — Encounter (HOSPITAL_BASED_OUTPATIENT_CLINIC_OR_DEPARTMENT_OTHER): Payer: Self-pay | Admitting: *Deleted

## 2019-09-13 ENCOUNTER — Emergency Department (HOSPITAL_BASED_OUTPATIENT_CLINIC_OR_DEPARTMENT_OTHER)
Admission: EM | Admit: 2019-09-13 | Discharge: 2019-09-14 | Disposition: A | Discharge status: Home / Self Care | Payer: Medicare Other | Attending: Emergency Medicine | Admitting: Emergency Medicine

## 2019-09-13 ENCOUNTER — Emergency Department (HOSPITAL_BASED_OUTPATIENT_CLINIC_OR_DEPARTMENT_OTHER): Payer: Medicare Other

## 2019-09-13 DIAGNOSIS — Y929 Unspecified place or not applicable: Secondary | ICD-10-CM | POA: Insufficient documentation

## 2019-09-13 DIAGNOSIS — Z882 Allergy status to sulfonamides status: Secondary | ICD-10-CM | POA: Diagnosis not present

## 2019-09-13 DIAGNOSIS — I10 Essential (primary) hypertension: Secondary | ICD-10-CM | POA: Insufficient documentation

## 2019-09-13 DIAGNOSIS — W228XXA Striking against or struck by other objects, initial encounter: Secondary | ICD-10-CM | POA: Insufficient documentation

## 2019-09-13 DIAGNOSIS — J45909 Unspecified asthma, uncomplicated: Secondary | ICD-10-CM | POA: Insufficient documentation

## 2019-09-13 DIAGNOSIS — E119 Type 2 diabetes mellitus without complications: Secondary | ICD-10-CM | POA: Diagnosis not present

## 2019-09-13 DIAGNOSIS — F17210 Nicotine dependence, cigarettes, uncomplicated: Secondary | ICD-10-CM | POA: Insufficient documentation

## 2019-09-13 DIAGNOSIS — Y9389 Activity, other specified: Secondary | ICD-10-CM | POA: Diagnosis not present

## 2019-09-13 DIAGNOSIS — Z7984 Long term (current) use of oral hypoglycemic drugs: Secondary | ICD-10-CM | POA: Diagnosis not present

## 2019-09-13 DIAGNOSIS — S8991XA Unspecified injury of right lower leg, initial encounter: Secondary | ICD-10-CM | POA: Diagnosis present

## 2019-09-13 DIAGNOSIS — J441 Chronic obstructive pulmonary disease with (acute) exacerbation: Secondary | ICD-10-CM | POA: Diagnosis not present

## 2019-09-13 DIAGNOSIS — Y999 Unspecified external cause status: Secondary | ICD-10-CM | POA: Diagnosis not present

## 2019-09-13 DIAGNOSIS — Z79899 Other long term (current) drug therapy: Secondary | ICD-10-CM | POA: Diagnosis not present

## 2019-09-13 DIAGNOSIS — S81811A Laceration without foreign body, right lower leg, initial encounter: Secondary | ICD-10-CM | POA: Diagnosis not present

## 2019-09-13 IMAGING — CR DG TIBIA/FIBULA 2V*R*
4 series · 4 of 4 positions shown · non-contrast
Comparison: None.

CLINICAL DATA: Laceration splitting logs.

EXAM:
RIGHT TIBIA AND FIBULA - 2 VIEW

[t tib/fib ap right (1 of 2)]
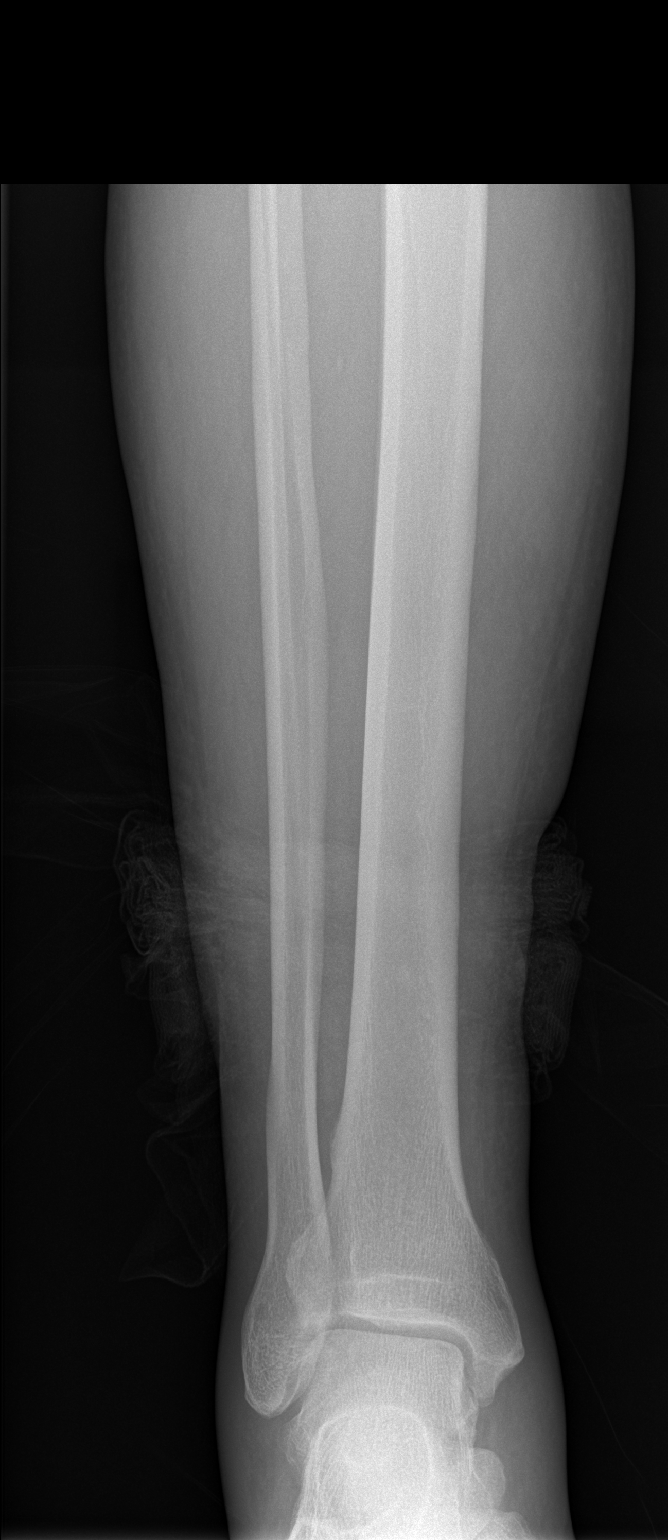

[t tib/fib ap right (2 of 2)]
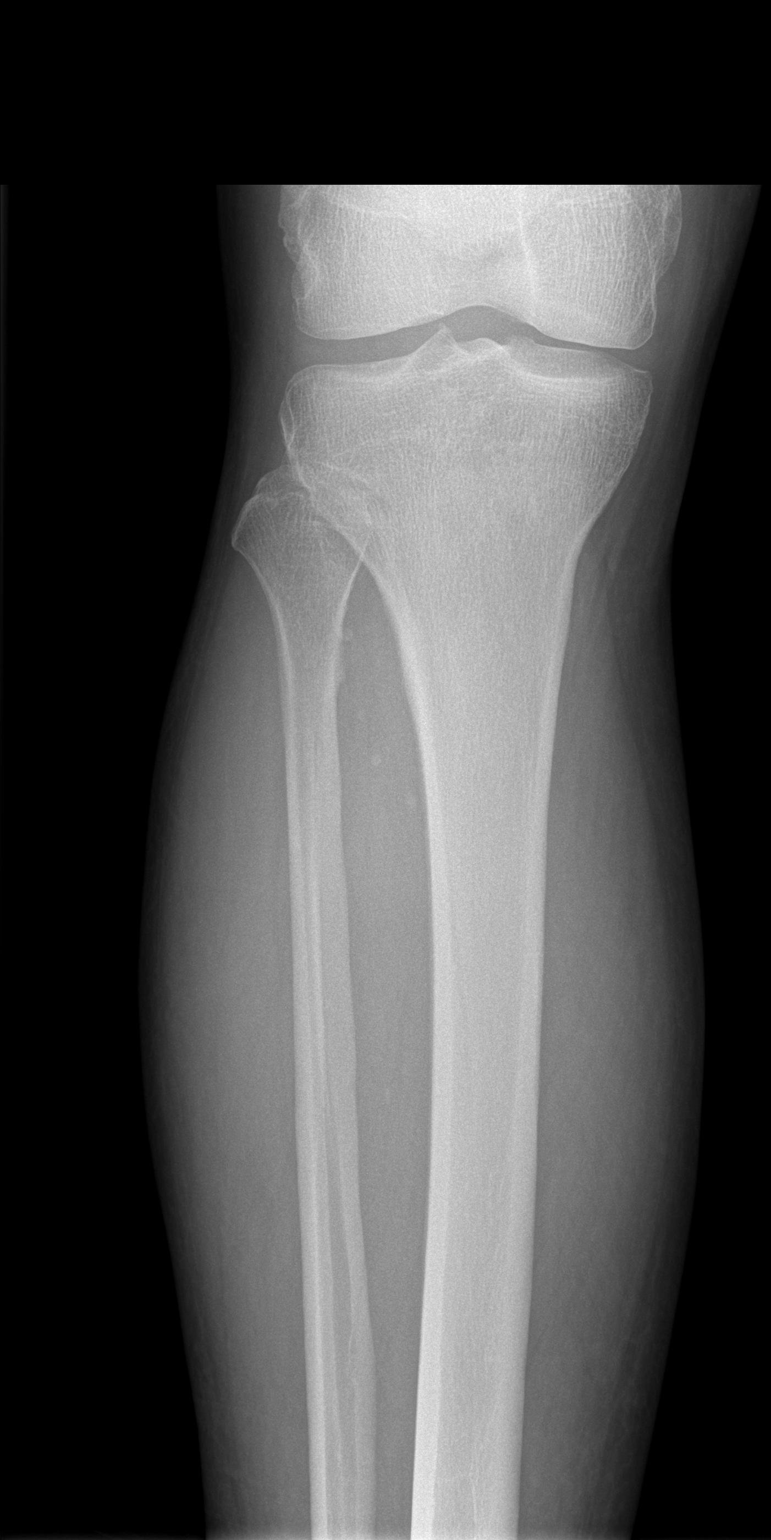

[t tib/fib lat right (1 of 2)]
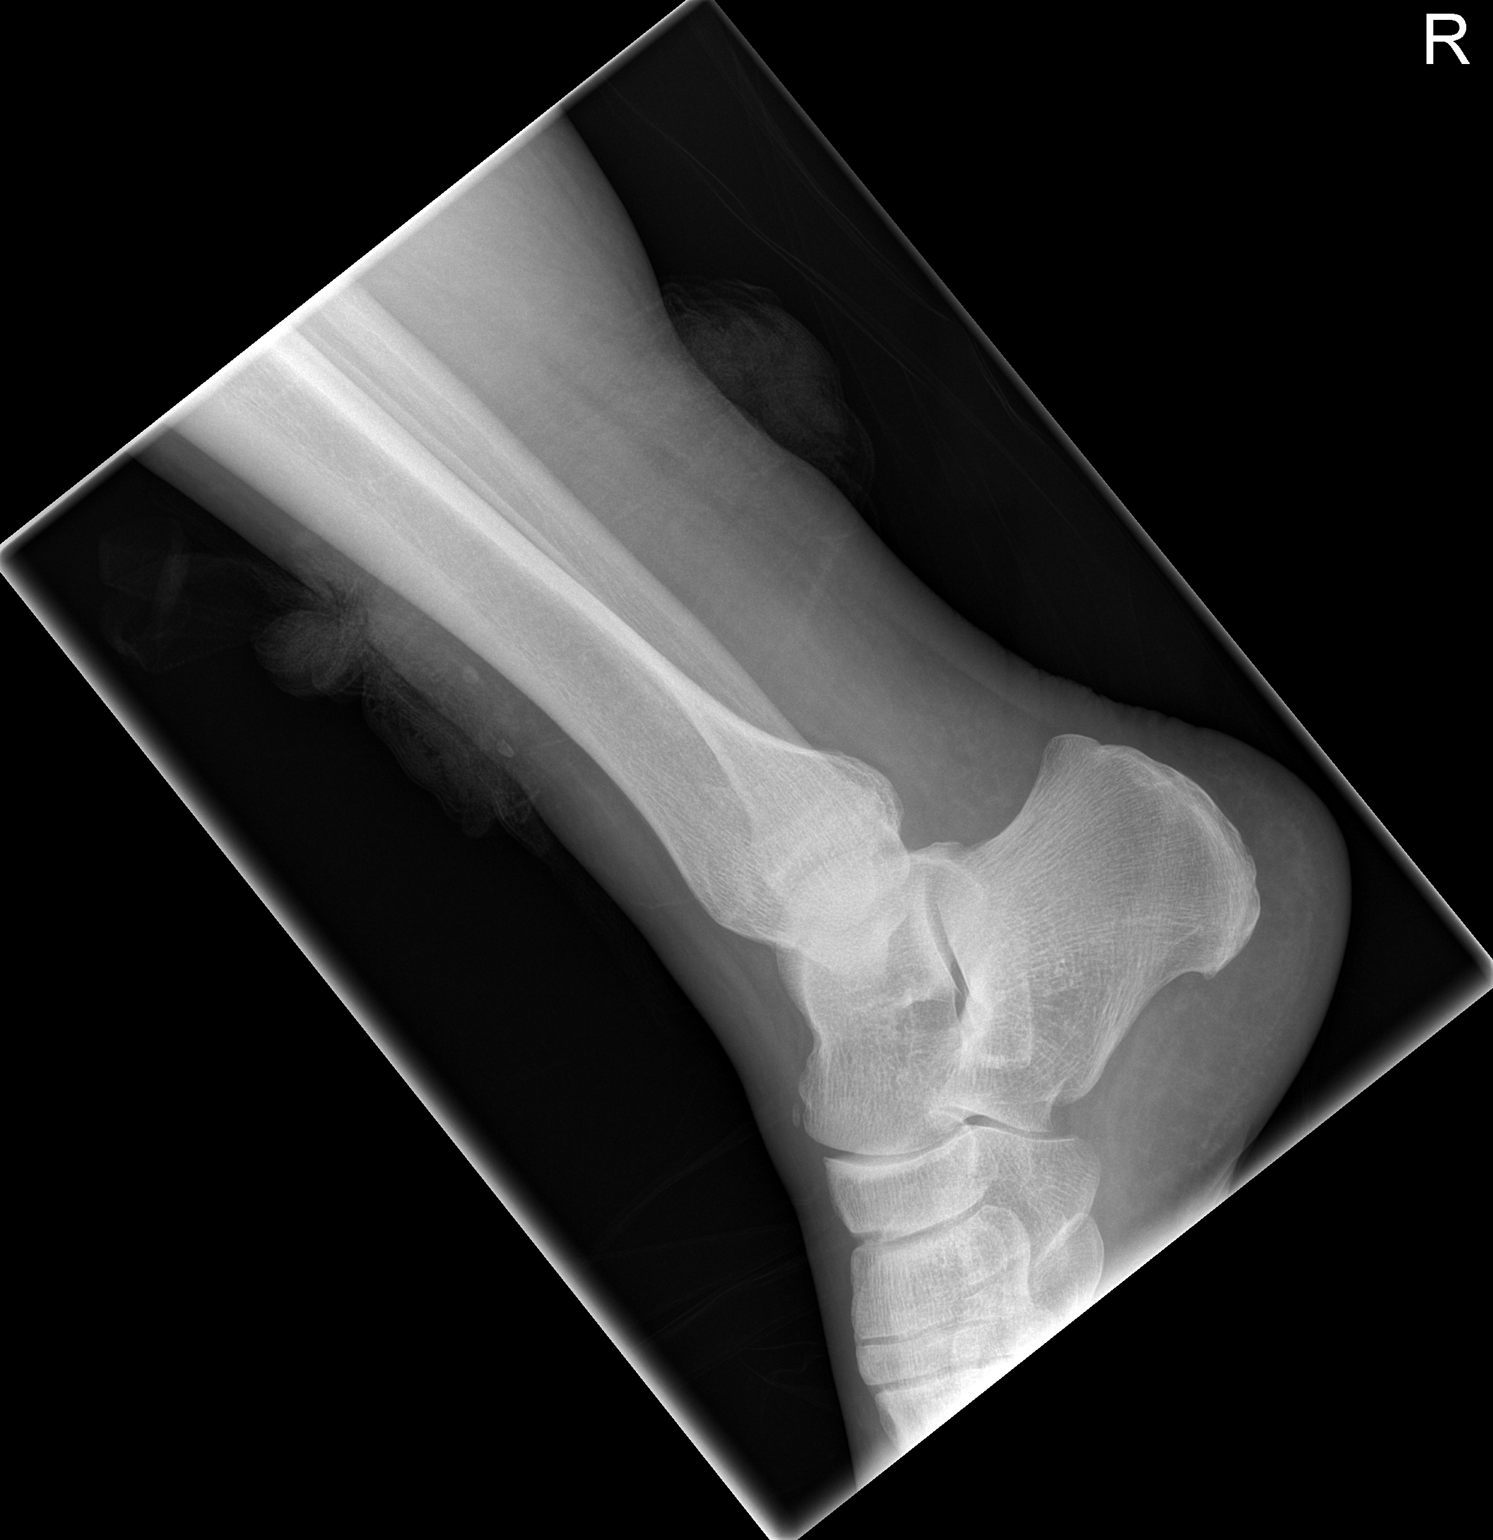

[t tib/fib lat right (2 of 2)]
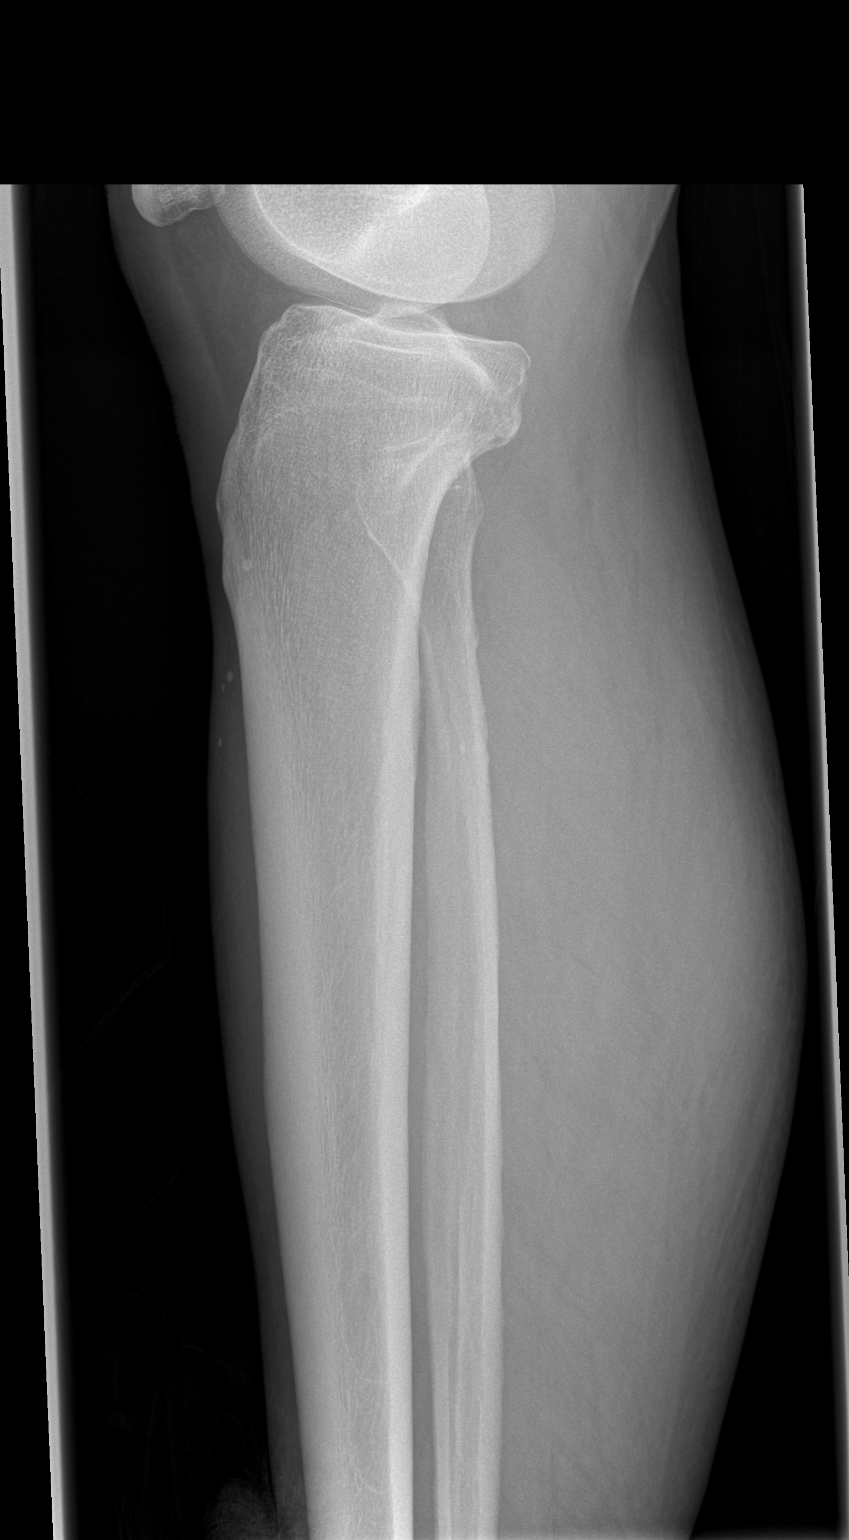

[4 of 4 positions shown; findings below may reference images not displayed]

FINDINGS: Negative for fracture. Anterior laceration distal shin. No foreign
body. Small phleboliths in the calf.
IMPRESSION: Negative for fracture or foreign body.  Anterior laceration.

## 2019-09-13 NOTE — ED Triage Notes (Signed)
Laceration to his right lower leg tonight while splitting logs. Bleeding controlled.

## 2019-09-13 NOTE — ED Provider Notes (Signed)
[facility] Provider Note: Georgena Spurling, MD, FACEP  CSN: [id number] MRN: [id number] ARRIVAL: 09/13/19 at [place]: [place]  Laceration   HISTORY OF PRESENT ILLNESS  09/13/19 11:51 PM Frederick Guzman is a 69 y.o. male who has a laceration to his right lower leg that occurred just prior to arrival while he was splitting logs.  He states the log struck his right shin.  He reports no associated pain.  Bleeding is controlled.  Tetanus is up-to-date.  He has a history of surgery for detached retina and states he is at high risk for infection in his eye.  He has some mild chronic edema of his lower legs.   Past Medical History:  Diagnosis Date  . Allergy   . Arthritis    OA  . Asthma   . Cancer ([place]) 08/2014   melanoma on R shoulder & back - [facility]   . COPD (chronic obstructive pulmonary disease) ([place])   . Depression    pt. denies- 01/2016, pt on Wellbutrin for smoking cessation  . Diabetes mellitus without complication ([place])   . Diverticulitis   . Dyspnea    with exertion  . Fibromyalgia   . GERD (gastroesophageal reflux disease)   . Hyperlipemia   . Hypertension    "never was high"  . Lymphocytic colitis 04/2018  . Pneumonia 06/2015, 10/2016  . Sleep apnea    last study - Nov. 2017, waiting for a new machine, using the old CPAP q night after cleaning the device after he was documented to have pneumonia from his CPAP machine.     Past Surgical History:  Procedure Laterality Date  . carpel tunnel Bilateral   . COLONOSCOPY W/ POLYPECTOMY    . COLONOSCOPY W/ POLYPECTOMY    . EXCISIONAL TOTAL SHOULDER ARTHROPLASTY WITH ANTIBIOTIC SPACER Left 08/09/2016   Procedure: LEFT SHOULDER HARDWARE REMOVAL, IRRIGATION AND DEBRIDEMENT, PLACEMENT OF ANTIBIOTIC SPACER;  Surgeon: Netta Cedars, MD;  Location: [facility];  Service: Orthopedics;  Laterality: Left;  . IRRIGATION AND DEBRIDEMENT SHOULDER Left 03/31/2016   Procedure: IRRIGATION AND DEBRIDEMENT  SHOULDER, deep cultures and polyethylene exchange;  Surgeon: Netta Cedars, MD;  Location: [facility];  Service: Orthopedics;  Laterality: Left;  requests 81mins  . REVERSE SHOULDER ARTHROPLASTY Left 01/30/2016   Procedure: LEFT REVERSE SHOULDER ARTHROPLASTY;  Surgeon: Netta Cedars, MD;  Location: [facility];  Service: Orthopedics;  Laterality: Left;  . ROTATOR CUFF REPAIR Left   . SHOULDER ARTHROSCOPY WITH DISTAL CLAVICLE RESECTION Right 01/30/2018   Procedure: RIGHT SHOULDER ARTHROSCOPY WITH OPEN DISTAL CLAVICLE RESECTION, SUBACROMIAL DECOMPRESSION;  Surgeon: Netta Cedars, MD;  Location: [facility];  Service: Orthopedics;  Laterality: Right;  . STERIOD INJECTION Right 01/30/2018   Procedure: STEROID INJECTION;  Surgeon: Netta Cedars, MD;  Location: [facility];  Service: Orthopedics;  Laterality: Right;  . TOTAL SHOULDER REVISION Left 01/17/2017   Procedure: LEFT SHOULDER REVISION REVERSE TOTAL SHOULDER ARTHROPLASTY;  Surgeon: Netta Cedars, MD;  Location: [facility];  Service: Orthopedics;  Laterality: Left;  . WRIST SURGERY Right 11/2014   Dr. Amedeo Plenty    Family History  Problem Relation Age of Onset  . Diabetes Mother   . Colon cancer Father   . Prostate cancer Father   . Diabetes Father   . Esophageal cancer Neg Hx   . Rectal cancer Neg Hx   . Stomach cancer Neg Hx     Social History   Tobacco Use  . Smoking status: Current Every Day  Smoker    Packs/day: 1.50    Years: 43.00    Pack years: 64.50    Types: Cigarettes  . Smokeless tobacco: Never Used  . Tobacco comment: pt no longer taking chantix  Vaping Use  . Vaping Use: Never used  Substance Use Topics  . Alcohol use: Yes    Alcohol/week: 9.0 standard drinks    Types: 3 Shots of liquor, 6 Cans of beer per week    Comment: beer or mixed drink   . Drug use: No    Prior to Admission medications   Medication Sig Start Date End Date Taking? Authorizing Provider  albuterol (PROAIR HFA) 108 (90 Base) MCG/ACT inhaler Inhale 2 puffs into the lungs  every 6 (six) hours as needed for wheezing or shortness of breath.    [provider]  albuterol (PROVENTIL) (2.5 MG/3ML) 0.083% nebulizer solution Take 2.5 mg by nebulization every 6 (six) hours as needed for wheezing or shortness of breath.    [provider]  atorvastatin (LIPITOR) 20 MG tablet Take 1 tablet (20 mg total) by mouth every other day. 03/09/19   [name], DO  b complex vitamins tablet Take 1 tablet by mouth daily.    [provider]  budesonide (ENTOCORT EC) 3 MG 24 hr capsule Take 3 capsules (9 mg total) by mouth daily. 01/17/19   Doran Stabler, MD  budesonide-formoterol (SYMBICORT) 160-4.5 MCG/ACT inhaler Inhale 2 puffs into the lungs 2 (two) times daily.    [provider]  buPROPion (WELLBUTRIN XL) 300 MG 24 hr tablet Take 1 tablet (300 mg total) by mouth daily. 09/11/19   [name], DO  cephALEXin (KEFLEX) 500 MG capsule Take 1 capsule (500 mg total) by mouth 4 (four) times daily. 09/14/19   Mikale Silversmith, MD  cetirizine (ZYRTEC) 10 MG tablet Take 10 mg by mouth daily as needed for allergies.     [provider]  esomeprazole (NEXIUM) 40 MG capsule Take 1 capsule (40 mg total) by mouth daily. 09/05/19   [name], DO  fluticasone [facility]) 50 MCG/ACT nasal spray Place 1 spray into both nostrils daily as needed for allergies. 12/30/15   [provider]  Fluticasone-Umeclidin-Vilant (TRELEGY ELLIPTA) 200-62.5-25 MCG/INH AEPB Inhale 1 puff into the lungs daily. 06/20/19   [name], DO  furosemide (LASIX) 20 MG tablet Take 1 tablet (20 mg total) by mouth 2 (two) times daily as needed for edema. 10/18/18   [name], DO  glimepiride (AMARYL) 1 MG tablet Take 1 tablet (1 mg total) by mouth daily with breakfast. 09/11/19   [name], DO  glucose blood test strip Use up to 4 times per day as directed with glucometer. Disp: 100. Refill x99 01/11/18   [name], DO    lisinopril (ZESTRIL) 2.5 MG tablet Take 1 tablet (2.5 mg total) by mouth at bedtime. 09/06/19   [name], DO  Multiple Vitamin (MULTIVITAMIN WITH MINERALS) TABS tablet Take 1 tablet by mouth at bedtime.    [provider]  Omega-3 Fatty Acids (FISH OIL) 1000 MG CAPS Take 1,000 mg by mouth at bedtime.     [provider]  Polyvinyl Alcohol-Povidone (REFRESH OP) Place 1 drop into both eyes daily as needed (dry eyes).    [provider]  tacrolimus (PROTOPIC) 0.1 % ointment Apply 1 application topically daily. 02/05/19   [provider]  tadalafil (CIALIS) 5 MG tablet Take 1 tablet by mouth once daily 07/09/19   [name], DO  Testosterone 30 MG/ACT SOLN APPLY 1-2 PUMPS DAILY. ALTERNATE TAKING ONE PUMP UNDER ONE ARM ONE DAY, TWO PUMPS THE NEXT DAY (ONE PUMP UNDER EACH ARM) 09/10/19   [name], DO  tiotropium (SPIRIVA HANDIHALER) 18 MCG inhalation capsule Place 1 capsule (18 mcg total) into inhaler and inhale daily. 01/30/18 01/30/19  [name], DO  vitamin C (ASCORBIC ACID) 500 MG tablet Take 500 mg by mouth daily.    [provider]    Allergies Methotrexate derivatives and Tramadol   REVIEW OF SYSTEMS  Negative except as noted here or in the History of Present Illness.   PHYSICAL EXAMINATION  Initial Vital Signs Blood pressure 134/78, pulse 100, temperature 98.6 F (37 C), temperature source Oral, resp. rate 14, height 6\' 1"  (1.854 m), weight 90.7 kg, SpO2 98 %.  Examination General: Well-developed, well-nourished male in no acute distress; appearance consistent with age of record HENT: normocephalic; atraumatic Eyes: Left pupil slightly larger than the right; extraocular muscles intact Neck: supple Heart: regular rate and rhythm Lungs: clear to auscultation bilaterally Abdomen: soft; nondistended; nontender; bowel sounds present Extremities: No deformity; full range of motion; pulses normal; trace edema  of lower legs; flap laceration of right shin:    Neurologic: Awake, alert and oriented; motor function intact in all extremities and symmetric; no facial droop Skin: Warm and dry Psychiatric: Normal mood and affect   RESULTS  Summary of this visit's results, reviewed and interpreted by myself:   EKG Interpretation  Date/Time:    Ventricular Rate:    PR Interval:    QRS Duration:   QT Interval:    QTC Calculation:   R Axis:     Text Interpretation:        Laboratory Studies: No results found for this or any previous visit (from the past 24 hour(s)). Imaging Studies: DG Tibia/Fibula Right  Result Date: 09/13/2019 CLINICAL DATA:  Laceration splitting logs. EXAM: RIGHT TIBIA AND FIBULA - 2 VIEW COMPARISON:  None. FINDINGS: Negative for fracture. Anterior laceration distal shin. No foreign body. Small phleboliths in the calf. IMPRESSION: Negative for fracture or foreign body.  Anterior laceration. Electronically Signed   By: Franchot Gallo M.D.   On: 09/13/2019 20:05    ED COURSE and MDM  Nursing notes, initial and subsequent vitals signs, including pulse oximetry, reviewed and interpreted by myself.  Vitals:   09/13/19 1929  BP: 134/78  Pulse: 100  Resp: 14  Temp: 98.6 F (37 C)  TempSrc: Oral  SpO2: 98%  Weight: 90.7 kg  Height: 6\' 1"  (1.854 m)   Medications  ceFAZolin (ANCEF) IVPB 1 g/50 mL premix ( Intravenous Stopped 09/14/19 0053)  lidocaine-EPINEPHrine (XYLOCAINE W/EPI) 1 %-1:100000 (with pres) injection 20 mL (20 mLs Intradermal Given by Other 09/14/19 0056)   The patient's wound was closed as described below.  He was given Ancef 1 g IV.  We will send him home on Keflex to help prevent infection.  He will be traveling for the next month.  He was advised to have his sutures removed in 2 weeks at an emergency department or urgent care of his choosing.   PROCEDURES  Procedures  LACERATION REPAIR Performed by: Karen Chafe Cynia Abruzzo Authorized by: Karen Chafe  Synthia Fairbank Consent: Verbal consent obtained. Risks and benefits: risks, benefits and alternatives were discussed Consent given by: patient Patient identity confirmed: provided demographic data Prepped and Draped in normal sterile fashion Wound explored  Laceration Location: Right shin  Laceration Length: Flap 4.5cm x 6 cm  No Foreign Bodies seen or palpated  Anesthesia: local infiltration  Local anesthetic: lidocaine 1% with epinephrine  Anesthetic total: 73ml  Irrigation method: syringe Amount of cleaning: standard  Skin closure: 3-0 Prolene plus Steri-Strips  Number of sutures: 15  Technique: Simple interrupted  Patient tolerance: Patient tolerated the procedure well with no immediate complications.       ED DIAGNOSES     ICD-10-CM   1. Laceration of right lower leg, initial encounter  X43.568S        Shanon Rosser, MD 09/14/19 [phone]

## 2019-09-14 DIAGNOSIS — S81811A Laceration without foreign body, right lower leg, initial encounter: Secondary | ICD-10-CM | POA: Diagnosis not present

## 2019-09-14 MED ORDER — LIDOCAINE-EPINEPHRINE 1 %-1:100000 IJ SOLN: 20.0000 mL | Freq: Once | INTRAMUSCULAR | Status: AC

## 2019-09-14 MED ORDER — CEPHALEXIN 500 MG PO CAPS
500.0000 mg | ORAL_CAPSULE | Freq: Four times a day (QID) | ORAL | 0 refills | Status: DC
Start: 2019-09-14 — End: 2020-01-15

## 2019-09-14 MED ORDER — CEFAZOLIN SODIUM-DEXTROSE 1-4 GM/50ML-% IV SOLN
1.0000 g | Freq: Once | INTRAVENOUS | Status: AC
  Administered 2019-09-14: 1 g via INTRAVENOUS
  Filled 2019-09-14: qty 50

## 2019-09-14 MED ORDER — LIDOCAINE-EPINEPHRINE 1 %-1:100000 IJ SOLN
INTRAMUSCULAR | Status: AC
  Administered 2019-09-14: 20 mL via INTRADERMAL
  Filled 2019-09-14: qty 1

## 2019-09-14 MED ORDER — LIDOCAINE-EPINEPHRINE 2 %-1:100000 IJ SOLN: 20.0000 mL | Freq: Once | INTRAMUSCULAR | Status: DC

## 2019-09-14 NOTE — ED Notes (Signed)
NS flushed out wound with 2 bottles, tolerated well, positioned skin back over wound.

## 2019-09-14 NOTE — Discharge Instructions (Signed)
Have your sutures removed in 2 weeks at an emergency department or urgent care of your choosing while traveling.

## 2019-09-14 NOTE — ED Notes (Signed)
Pt. Cut his R lower leg on a piece of wood yesterday evening.  Pt. Able to walk with no difficulty.

## 2019-09-18 ENCOUNTER — Encounter: Payer: Self-pay | Admitting: Osteopathic Medicine

## 2019-09-18 ENCOUNTER — Ambulatory Visit (INDEPENDENT_AMBULATORY_CARE_PROVIDER_SITE_OTHER): Payer: Medicare Other | Admitting: Osteopathic Medicine

## 2019-09-18 VITALS — BP 123/75 | HR 66 | Wt 198.0 lb

## 2019-09-18 DIAGNOSIS — E1169 Type 2 diabetes mellitus with other specified complication: Secondary | ICD-10-CM | POA: Diagnosis not present

## 2019-09-18 DIAGNOSIS — F418 Other specified anxiety disorders: Secondary | ICD-10-CM

## 2019-09-18 DIAGNOSIS — S81811A Laceration without foreign body, right lower leg, initial encounter: Secondary | ICD-10-CM

## 2019-09-18 LAB — POCT GLYCOSYLATED HEMOGLOBIN (HGB A1C): Hemoglobin A1C: 6.5 % — AB (ref 4.0–5.6)

## 2019-09-18 MED ORDER — BUPROPION HCL ER (XL) 150 MG PO TB24
150.0000 mg | ORAL_TABLET | Freq: Every day | ORAL | 0 refills | Status: DC
Start: 2019-09-18 — End: 2020-04-16

## 2019-09-18 NOTE — Progress Notes (Signed)
Frederick Guzman is a 69 y.o. male who presents to  [facility] at [facility]  today, 09/18/19, seeking care for the following: . DM2 . Check laceration / stitches - struck by flying piece of wood from log splitter which hit him in the chest and then fell down to R leg. ER visit same day, wound appears to be healing appropriately, some erythema but it's improving . Will be going on road trip soon, does better w/ increased dose Wellbutrin on vacations to avoid road rage/nxiety and mood swings       ASSESSMENT & PLAN with other pertinent history/findings:  The primary encounter diagnosis was Type 2 diabetes mellitus with other specified complication, without long-term current use of insulin ([place]). Diagnoses of Laceration of right lower extremity, initial encounter and Anxious depression were also pertinent to this visit.     DIABETES SCREENING/PREVENTIVE CARE:  Medications as of 09/18/19, --> changes made last visit 06/20/19 and today 09/18/19: none . Metformin: intolerance . Other antihyperglycemics: glimepiride 1 mg daily  . ACE/ARB: Lisinopril 2.5 mg daily o (Microalbumin needed? n/a) . Antiplatelet if ASCVD Risk >10%: recommended o The ASCVD Risk score [name], et al., 2013) failed to calculate for the following reasons: o   The valid total cholesterol range is 130 to 320 mg/dL . Statin: Atorvastatin 20 mg daily  o LDL goal <70: Yes  04/10/2018, due to recheck   A1C:  Today 09/18/19: 6.5  Last visit 06/20/19: 6.8  04/20/18: 6.2   BP goal <130/80: white coat  BP Readings from Last 3 Encounters:  09/18/19 123/75  09/14/19 (!) 155/76  06/20/19 (!) 151/88   Wt Readings from Last 3 Encounters:  09/18/19 198 lb (89.8 kg)  09/13/19 200 lb (90.7 kg)  06/20/19 199 lb (90.3 kg)   Eye exam annually: 09/2018, importance discussed with patient Foot exam: due   Pneumovax:: done Immunization History  Administered Date(s)  Administered  . Fluad Quad(high Dose 65+) 11/14/2018  . Influenza, High Dose Seasonal PF 12/15/2016, 01/19/2018  . Influenza, Seasonal, Injecte, Preservative Fre 11/27/2015  . Influenza-Unspecified 02/14/2012, 12/27/2012  . PFIZER SARS-COV-2 Vaccination 04/13/2019, 05/04/2019  . Pneumococcal Conjugate-13 03/18/2015  . Pneumococcal Polysaccharide-23 01/19/2008, 12/15/2016  . Tdap 05/31/2011      There are no Patient Instructions on file for this visit.   Orders Placed This Encounter  Procedures  . POCT HgB A1C    Meds ordered this encounter  Medications  . buPROPion (WELLBUTRIN XL) 150 MG 24 hr tablet    Sig: Take 1 tablet (150 mg total) by mouth daily. To take in addition to 300 mg tablets for TDD 450 mg    Dispense:  90 tablet    Refill:  0       Follow-up instructions: Return in about 4 months (around 01/18/2020) for A1C check and labs - see me sooner if needed! .                                         BP 123/75 (BP Location: Left Arm, Patient Position: Sitting)   Pulse 66   Wt 198 lb (89.8 kg)   BMI 26.12 kg/m   Current Meds  Medication Sig  . albuterol (PROAIR HFA) 108 (90 Base) MCG/ACT inhaler Inhale 2 puffs into the lungs every 6 (six) hours as needed for wheezing or shortness of  breath.  Marland Kitchen albuterol (PROVENTIL) (2.5 MG/3ML) 0.083% nebulizer solution Take 2.5 mg by nebulization every 6 (six) hours as needed for wheezing or shortness of breath.  Marland Kitchen atorvastatin (LIPITOR) 20 MG tablet Take 1 tablet (20 mg total) by mouth every other day.  . b complex vitamins tablet Take 1 tablet by mouth daily.  Marland Kitchen buPROPion (WELLBUTRIN XL) 300 MG 24 hr tablet Take 1 tablet (300 mg total) by mouth daily.  . cephALEXin (KEFLEX) 500 MG capsule Take 1 capsule (500 mg total) by mouth 4 (four) times daily.  . cetirizine (ZYRTEC) 10 MG tablet Take 10 mg by mouth daily as needed for allergies.   Marland Kitchen esomeprazole (NEXIUM) 40 MG capsule Take 1 capsule  (40 mg total) by mouth daily.  . fluticasone (FLONASE) 50 MCG/ACT nasal spray Place 1 spray into both nostrils daily as needed for allergies.  . Fluticasone-Umeclidin-Vilant (TRELEGY ELLIPTA) 200-62.5-25 MCG/INH AEPB Inhale 1 puff into the lungs daily.  . furosemide (LASIX) 20 MG tablet Take 1 tablet (20 mg total) by mouth 2 (two) times daily as needed for edema.  Marland Kitchen glimepiride (AMARYL) 1 MG tablet Take 1 tablet (1 mg total) by mouth daily with breakfast.  . glucose blood test strip Use up to 4 times per day as directed with glucometer. Disp: 100. Refill x99  . lisinopril (ZESTRIL) 2.5 MG tablet Take 1 tablet (2.5 mg total) by mouth at bedtime.  . Multiple Vitamin (MULTIVITAMIN WITH MINERALS) TABS tablet Take 1 tablet by mouth at bedtime.  . Omega-3 Fatty Acids (FISH OIL) 1000 MG CAPS Take 1,000 mg by mouth at bedtime.   . Polyvinyl Alcohol-Povidone (REFRESH OP) Place 1 drop into both eyes daily as needed (dry eyes).  . tacrolimus (PROTOPIC) 0.1 % ointment Apply 1 application topically daily.  . tadalafil (CIALIS) 5 MG tablet Take 1 tablet by mouth once daily  . Testosterone 30 MG/ACT SOLN APPLY 1-2 PUMPS DAILY. ALTERNATE TAKING ONE PUMP UNDER ONE ARM ONE DAY, TWO PUMPS THE NEXT DAY (ONE PUMP UNDER EACH ARM)  . vitamin C (ASCORBIC ACID) 500 MG tablet Take 500 mg by mouth daily.    Results for orders placed or performed in visit on 09/18/19 (from the past 72 hour(s))  POCT HgB A1C     Status: Abnormal   Collection Time: 09/18/19  8:37 AM  Result Value Ref Range   Hemoglobin A1C 6.5 (A) 4.0 - 5.6 %   HbA1c POC (<> result, manual entry)     HbA1c, POC (prediabetic range)     HbA1c, POC (controlled diabetic range)      No results found.  Depression screen [facility] 2/9 09/18/2019 06/26/2018 10/17/2017  Decreased Interest 1 0 0  Down, Depressed, Hopeless 1 0 1  PHQ - 2 Score 2 0 1  Altered sleeping 1 - 1  Tired, decreased energy 1 - 2  Change in appetite 0 - 0  Feeling bad or failure about  yourself  1 - 0  Trouble concentrating 0 - 0  Moving slowly or fidgety/restless 0 - 0  Suicidal thoughts 0 - 0  PHQ-9 Score 5 - 4  Difficult doing work/chores Not difficult at all - Somewhat difficult    GAD 7 : Generalized Anxiety Score 09/18/2019 10/17/2017  Nervous, Anxious, on Edge 1 2  Control/stop worrying 1 2  Worry too much - different things 0 2  Trouble relaxing 1 2  Restless 0 0  Easily annoyed or irritable 2 3  Afraid - awful might happen  0 0  Total GAD 7 Score 5 11  Anxiety Difficulty Somewhat difficult Very difficult      All questions at time of visit were answered - patient instructed to contact office with any additional concerns or updates.  ER/RTC precautions were reviewed with the patient.  Please note: voice recognition software was used to produce this document, and typos may escape review. Please contact Dr. Sheppard Coil for any needed clarifications.

## 2019-09-25 ENCOUNTER — Encounter: Payer: Self-pay | Admitting: Osteopathic Medicine

## 2019-09-30 DIAGNOSIS — Z4802 Encounter for removal of sutures: Secondary | ICD-10-CM | POA: Diagnosis not present

## 2019-10-06 DIAGNOSIS — Z4802 Encounter for removal of sutures: Secondary | ICD-10-CM | POA: Diagnosis not present

## 2019-10-19 DIAGNOSIS — G4733 Obstructive sleep apnea (adult) (pediatric): Secondary | ICD-10-CM | POA: Diagnosis not present

## 2019-10-26 DIAGNOSIS — G4733 Obstructive sleep apnea (adult) (pediatric): Secondary | ICD-10-CM | POA: Diagnosis not present

## 2019-10-26 DIAGNOSIS — J449 Chronic obstructive pulmonary disease, unspecified: Secondary | ICD-10-CM | POA: Diagnosis not present

## 2019-11-05 DIAGNOSIS — F17210 Nicotine dependence, cigarettes, uncomplicated: Secondary | ICD-10-CM | POA: Diagnosis not present

## 2019-11-14 DIAGNOSIS — R0602 Shortness of breath: Secondary | ICD-10-CM | POA: Diagnosis not present

## 2019-11-14 DIAGNOSIS — R918 Other nonspecific abnormal finding of lung field: Secondary | ICD-10-CM | POA: Diagnosis not present

## 2019-11-14 DIAGNOSIS — Z720 Tobacco use: Secondary | ICD-10-CM | POA: Diagnosis not present

## 2019-11-14 DIAGNOSIS — J449 Chronic obstructive pulmonary disease, unspecified: Secondary | ICD-10-CM | POA: Diagnosis not present

## 2019-11-23 DIAGNOSIS — J449 Chronic obstructive pulmonary disease, unspecified: Secondary | ICD-10-CM | POA: Diagnosis not present

## 2019-11-23 DIAGNOSIS — G4733 Obstructive sleep apnea (adult) (pediatric): Secondary | ICD-10-CM | POA: Diagnosis not present

## 2019-12-03 DIAGNOSIS — E119 Type 2 diabetes mellitus without complications: Secondary | ICD-10-CM | POA: Diagnosis not present

## 2019-12-03 DIAGNOSIS — H524 Presbyopia: Secondary | ICD-10-CM | POA: Diagnosis not present

## 2019-12-08 ENCOUNTER — Other Ambulatory Visit: Payer: Self-pay | Admitting: Osteopathic Medicine

## 2019-12-20 DIAGNOSIS — Z23 Encounter for immunization: Secondary | ICD-10-CM | POA: Diagnosis not present

## 2019-12-28 DIAGNOSIS — G4733 Obstructive sleep apnea (adult) (pediatric): Secondary | ICD-10-CM | POA: Diagnosis not present

## 2019-12-28 DIAGNOSIS — J449 Chronic obstructive pulmonary disease, unspecified: Secondary | ICD-10-CM | POA: Diagnosis not present

## 2020-01-02 DIAGNOSIS — Z23 Encounter for immunization: Secondary | ICD-10-CM | POA: Diagnosis not present

## 2020-01-15 ENCOUNTER — Encounter: Payer: Self-pay | Admitting: Osteopathic Medicine

## 2020-01-15 ENCOUNTER — Ambulatory Visit (INDEPENDENT_AMBULATORY_CARE_PROVIDER_SITE_OTHER): Payer: Medicare Other | Admitting: Osteopathic Medicine

## 2020-01-15 ENCOUNTER — Other Ambulatory Visit: Payer: Self-pay

## 2020-01-15 VITALS — BP 162/84 | HR 74 | Temp 97.5°F | Wt 201.1 lb

## 2020-01-15 DIAGNOSIS — N401 Enlarged prostate with lower urinary tract symptoms: Secondary | ICD-10-CM | POA: Diagnosis not present

## 2020-01-15 DIAGNOSIS — F418 Other specified anxiety disorders: Secondary | ICD-10-CM

## 2020-01-15 DIAGNOSIS — Z0000 Encounter for general adult medical examination without abnormal findings: Secondary | ICD-10-CM

## 2020-01-15 DIAGNOSIS — E291 Testicular hypofunction: Secondary | ICD-10-CM

## 2020-01-15 DIAGNOSIS — E1169 Type 2 diabetes mellitus with other specified complication: Secondary | ICD-10-CM

## 2020-01-15 DIAGNOSIS — I10 Essential (primary) hypertension: Secondary | ICD-10-CM | POA: Diagnosis not present

## 2020-01-15 DIAGNOSIS — R3914 Feeling of incomplete bladder emptying: Secondary | ICD-10-CM

## 2020-01-15 LAB — POCT GLYCOSYLATED HEMOGLOBIN (HGB A1C): Hemoglobin A1C: 6.9 % — AB (ref 4.0–5.6)

## 2020-01-15 MED ORDER — TADALAFIL 20 MG PO TABS: 20.0000 mg | ORAL_TABLET | Freq: Every day | ORAL | 3 refills | Status: DC | PRN

## 2020-01-15 NOTE — Progress Notes (Signed)
Frederick Guzman is a 69 y.o. male who presents to  [place] at [facility]  today, 01/15/20, seeking care for the following:  . DM2 follow-up  . BP check - reports at goal at home      Keys with other pertinent findings:  The primary encounter diagnosis was Type 2 diabetes mellitus with other specified complication, without long-term current use of insulin ([place]). Diagnoses of Annual physical exam, Testosterone deficiency in male, Benign prostatic hyperplasia with incomplete bladder emptying, Anxious depression, and White coat syndrome with diagnosis of hypertension were also pertinent to this visit.   Labs ordered for future visit. Annual physical / preventive care was NOT performed or billed today.   A1C at goal Taking Wellbutrin 300 mg, not 450, may increase again later in the year (helps w/ his road rage). Will leave it on the list.  BP at goal at home <130/80    Results for orders placed or performed in visit on 01/15/20 (from the past 24 hour(s))  POCT HgB A1C     Status: Abnormal   Collection Time: 01/15/20  9:09 AM  Result Value Ref Range   Hemoglobin A1C 6.9 (A) 4.0 - 5.6 %   HbA1c POC (<> result, manual entry)     HbA1c, POC (prediabetic range)     HbA1c, POC (controlled diabetic range)       BP Readings from Last 3 Encounters:  01/15/20 (!) 162/84  09/18/19 123/75  09/14/19 (!) 155/76       There are no Patient Instructions on file for this visit.  Orders Placed This Encounter  Procedures  . CBC  . COMPLETE METABOLIC PANEL WITH GFR  . Lipid panel  . Hemoglobin A1c  . PSA, Total with Reflex to PSA, Free  . POCT HgB A1C    Meds ordered this encounter  Medications  . tadalafil (CIALIS) 20 MG tablet    Sig: Take 1 tablet (20 mg total) by mouth daily as needed for erectile dysfunction.    Dispense:  30 tablet    Refill:  3       Follow-up instructions: Return in about 3 months (around  04/16/2020) for [place] (get labs prior to visit, orders are in).                                         BP (!) 162/84 (BP Location: Right Arm, Patient Position: Sitting, Cuff Size: Normal)   Pulse 74   Temp (!) 97.5 F (36.4 C) (Oral)   Wt 201 lb 1.3 oz (91.2 kg)   BMI 26.53 kg/m   Current Meds  Medication Sig  . albuterol (PROAIR HFA) 108 (90 Base) MCG/ACT inhaler Inhale 2 puffs into the lungs every 6 (six) hours as needed for wheezing or shortness of breath.  Marland Kitchen albuterol (PROVENTIL) (2.5 MG/3ML) 0.083% nebulizer solution Take 2.5 mg by nebulization every 6 (six) hours as needed for wheezing or shortness of breath.  Marland Kitchen atorvastatin (LIPITOR) 20 MG tablet Take 1 tablet (20 mg total) by mouth every other day.  . b complex vitamins tablet Take 1 tablet by mouth daily.  . Budeson-Glycopyrrol-Formoterol (BREZTRI AEROSPHERE) 160-9-4.8 MCG/ACT AERO Inhale into the lungs.  Marland Kitchen buPROPion (WELLBUTRIN XL) 300 MG 24 hr tablet Take 1 tablet (300 mg total) by mouth daily.  . cetirizine (ZYRTEC) 10 MG  tablet Take 10 mg by mouth daily as needed for allergies.   Marland Kitchen esomeprazole (NEXIUM) 40 MG capsule Take 1 capsule (40 mg total) by mouth daily.  . fluticasone (FLONASE) 50 MCG/ACT nasal spray Place 1 spray into both nostrils daily as needed for allergies.  . furosemide (LASIX) 20 MG tablet Take 1 tablet (20 mg total) by mouth 2 (two) times daily as needed for edema.  Marland Kitchen glimepiride (AMARYL) 1 MG tablet Take 1 tablet (1 mg total) by mouth daily with breakfast.  . glucose blood test strip Use up to 4 times per day as directed with glucometer. Disp: 100. Refill x99  . lisinopril (ZESTRIL) 2.5 MG tablet Take 1 tablet (2.5 mg total) by mouth at bedtime.  . Multiple Vitamin (MULTIVITAMIN WITH MINERALS) TABS tablet Take 1 tablet by mouth at bedtime.  . Omega-3 Fatty Acids (FISH OIL) 1000 MG CAPS Take 1,000 mg by mouth at bedtime.   . Polyvinyl Alcohol-Povidone (REFRESH  OP) Place 1 drop into both eyes daily as needed (dry eyes).  . tacrolimus (PROTOPIC) 0.1 % ointment Apply 1 application topically daily.  . tadalafil (CIALIS) 20 MG tablet Take 1 tablet (20 mg total) by mouth daily as needed for erectile dysfunction.  . Testosterone 30 MG/ACT SOLN APPLY 1-2 PUMPS DAILY. ALTERNATE TAKING ONE PUMP UNDER ONE ARM ONE DAY, TWO PUMPS THE NEXT DAY (ONE PUMP UNDER EACH ARM)  . vitamin C (ASCORBIC ACID) 500 MG tablet Take 500 mg by mouth daily.  . [DISCONTINUED] tadalafil (CIALIS) 5 MG tablet Take 1 tablet by mouth once daily    Results for orders placed or performed in visit on 01/15/20 (from the past 72 hour(s))  POCT HgB A1C     Status: Abnormal   Collection Time: 01/15/20  9:09 AM  Result Value Ref Range   Hemoglobin A1C 6.9 (A) 4.0 - 5.6 %   HbA1c POC (<> result, manual entry)     HbA1c, POC (prediabetic range)     HbA1c, POC (controlled diabetic range)      No results found.     All questions at time of visit were answered - patient instructed to contact office with any additional concerns or updates.  ER/RTC precautions were reviewed with the patient as applicable.   Please note: voice recognition software was used to produce this document, and typos may escape review. Please contact Dr. Sheppard Coil for any needed clarifications.

## 2020-02-01 DIAGNOSIS — J449 Chronic obstructive pulmonary disease, unspecified: Secondary | ICD-10-CM | POA: Diagnosis not present

## 2020-02-01 DIAGNOSIS — G4733 Obstructive sleep apnea (adult) (pediatric): Secondary | ICD-10-CM | POA: Diagnosis not present

## 2020-02-21 ENCOUNTER — Other Ambulatory Visit: Payer: Self-pay | Admitting: Osteopathic Medicine

## 2020-02-21 DIAGNOSIS — D692 Other nonthrombocytopenic purpura: Secondary | ICD-10-CM | POA: Diagnosis not present

## 2020-02-21 DIAGNOSIS — L570 Actinic keratosis: Secondary | ICD-10-CM | POA: Diagnosis not present

## 2020-02-21 DIAGNOSIS — D2262 Melanocytic nevi of left upper limb, including shoulder: Secondary | ICD-10-CM | POA: Diagnosis not present

## 2020-02-21 DIAGNOSIS — L720 Epidermal cyst: Secondary | ICD-10-CM | POA: Diagnosis not present

## 2020-02-21 DIAGNOSIS — L814 Other melanin hyperpigmentation: Secondary | ICD-10-CM | POA: Diagnosis not present

## 2020-02-21 DIAGNOSIS — D225 Melanocytic nevi of trunk: Secondary | ICD-10-CM | POA: Diagnosis not present

## 2020-02-21 DIAGNOSIS — D485 Neoplasm of uncertain behavior of skin: Secondary | ICD-10-CM | POA: Diagnosis not present

## 2020-02-21 DIAGNOSIS — D1801 Hemangioma of skin and subcutaneous tissue: Secondary | ICD-10-CM | POA: Diagnosis not present

## 2020-02-21 DIAGNOSIS — B078 Other viral warts: Secondary | ICD-10-CM | POA: Diagnosis not present

## 2020-02-21 DIAGNOSIS — Z85820 Personal history of malignant melanoma of skin: Secondary | ICD-10-CM | POA: Diagnosis not present

## 2020-02-21 DIAGNOSIS — Z85828 Personal history of other malignant neoplasm of skin: Secondary | ICD-10-CM | POA: Diagnosis not present

## 2020-03-07 ENCOUNTER — Other Ambulatory Visit: Payer: Self-pay | Admitting: Osteopathic Medicine

## 2020-03-21 ENCOUNTER — Other Ambulatory Visit: Payer: Self-pay | Admitting: Osteopathic Medicine

## 2020-04-04 DIAGNOSIS — G4733 Obstructive sleep apnea (adult) (pediatric): Secondary | ICD-10-CM | POA: Diagnosis not present

## 2020-04-04 DIAGNOSIS — J449 Chronic obstructive pulmonary disease, unspecified: Secondary | ICD-10-CM | POA: Diagnosis not present

## 2020-04-08 ENCOUNTER — Other Ambulatory Visit: Payer: Self-pay | Admitting: Osteopathic Medicine

## 2020-04-16 ENCOUNTER — Ambulatory Visit (INDEPENDENT_AMBULATORY_CARE_PROVIDER_SITE_OTHER): Payer: Medicare Other

## 2020-04-16 ENCOUNTER — Other Ambulatory Visit: Payer: Self-pay

## 2020-04-16 ENCOUNTER — Encounter: Payer: Self-pay | Admitting: Osteopathic Medicine

## 2020-04-16 ENCOUNTER — Ambulatory Visit (INDEPENDENT_AMBULATORY_CARE_PROVIDER_SITE_OTHER): Payer: Medicare Other | Admitting: Osteopathic Medicine

## 2020-04-16 VITALS — BP 137/78 | HR 80 | Wt 201.3 lb

## 2020-04-16 DIAGNOSIS — F17200 Nicotine dependence, unspecified, uncomplicated: Secondary | ICD-10-CM

## 2020-04-16 DIAGNOSIS — F32A Depression, unspecified: Secondary | ICD-10-CM | POA: Diagnosis not present

## 2020-04-16 DIAGNOSIS — I10 Essential (primary) hypertension: Secondary | ICD-10-CM | POA: Diagnosis not present

## 2020-04-16 DIAGNOSIS — S42022A Displaced fracture of shaft of left clavicle, initial encounter for closed fracture: Secondary | ICD-10-CM

## 2020-04-16 DIAGNOSIS — N401 Enlarged prostate with lower urinary tract symptoms: Secondary | ICD-10-CM | POA: Diagnosis not present

## 2020-04-16 DIAGNOSIS — R3914 Feeling of incomplete bladder emptying: Secondary | ICD-10-CM | POA: Diagnosis not present

## 2020-04-16 DIAGNOSIS — Z136 Encounter for screening for cardiovascular disorders: Secondary | ICD-10-CM | POA: Diagnosis not present

## 2020-04-16 DIAGNOSIS — E291 Testicular hypofunction: Secondary | ICD-10-CM | POA: Diagnosis not present

## 2020-04-16 DIAGNOSIS — Z96642 Presence of left artificial hip joint: Secondary | ICD-10-CM | POA: Diagnosis not present

## 2020-04-16 DIAGNOSIS — F418 Other specified anxiety disorders: Secondary | ICD-10-CM | POA: Diagnosis not present

## 2020-04-16 DIAGNOSIS — S4992XA Unspecified injury of left shoulder and upper arm, initial encounter: Secondary | ICD-10-CM | POA: Diagnosis not present

## 2020-04-16 DIAGNOSIS — S42002A Fracture of unspecified part of left clavicle, initial encounter for closed fracture: Secondary | ICD-10-CM | POA: Diagnosis not present

## 2020-04-16 DIAGNOSIS — Z0000 Encounter for general adult medical examination without abnormal findings: Secondary | ICD-10-CM | POA: Diagnosis not present

## 2020-04-16 DIAGNOSIS — E1169 Type 2 diabetes mellitus with other specified complication: Secondary | ICD-10-CM | POA: Diagnosis not present

## 2020-04-16 DIAGNOSIS — Z13820 Encounter for screening for osteoporosis: Secondary | ICD-10-CM | POA: Diagnosis not present

## 2020-04-16 DIAGNOSIS — F419 Anxiety disorder, unspecified: Secondary | ICD-10-CM | POA: Diagnosis not present

## 2020-04-16 IMAGING — DX DG CLAVICLE*L*
2 series · 2 of 2 positions shown · non-contrast
Comparison: [DATE] left shoulder radiographs

CLINICAL DATA: Fall on ice 9 days prior with left shoulder injury

EXAM:
LEFT CLAVICLE - 2+ VIEWS

[clavicle ap]
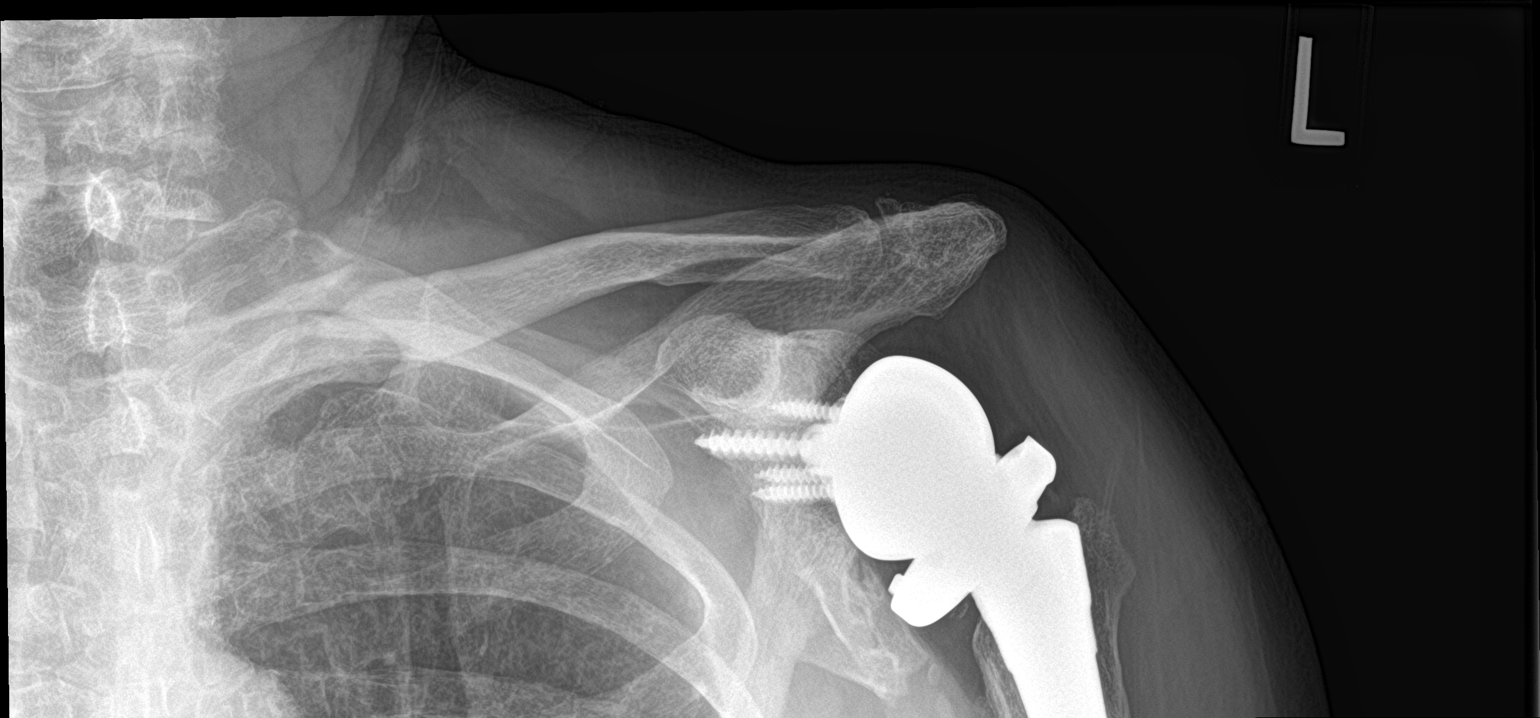

[clavicle axial]
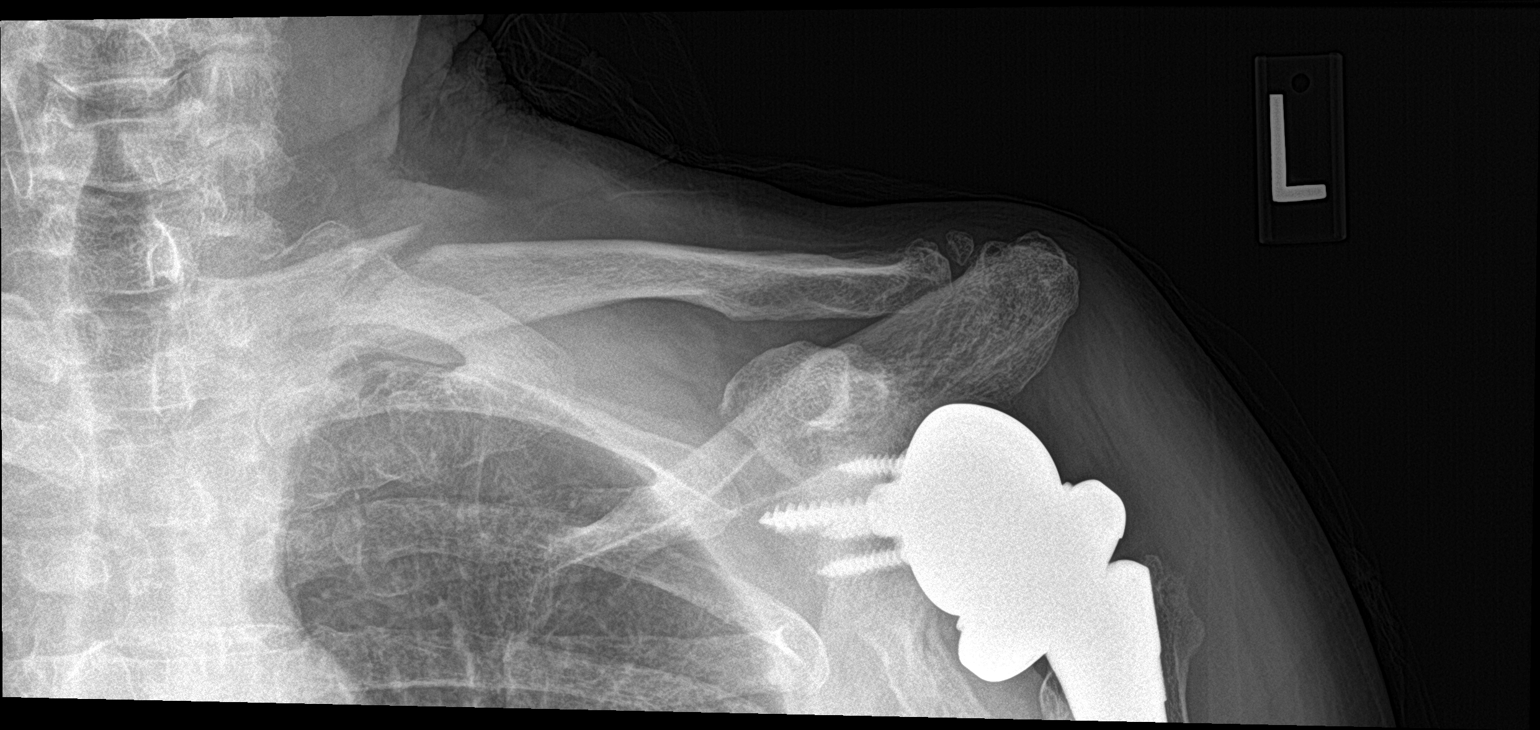

[2 of 2 positions shown; findings below may reference images not displayed]

FINDINGS: Oblique fracture in the medial left clavicle with minimal 4 mm
inferior displacement of the lateral fracture fragment. Partially
visualized reverse left total shoulder arthroplasty with no
appreciable dislocation on these views. No focal osseous lesions.
IMPRESSION: Minimally displaced medial left clavicle fracture.

## 2020-04-16 IMAGING — DX DG SHOULDER 2+V*L*
3 series · 3 of 3 positions shown · non-contrast
Comparison: [DATE] left shoulder radiographs

CLINICAL DATA: Fall 9 days prior with left shoulder injury

EXAM:
LEFT SHOULDER - 2+ VIEW

[shoulder grashey]
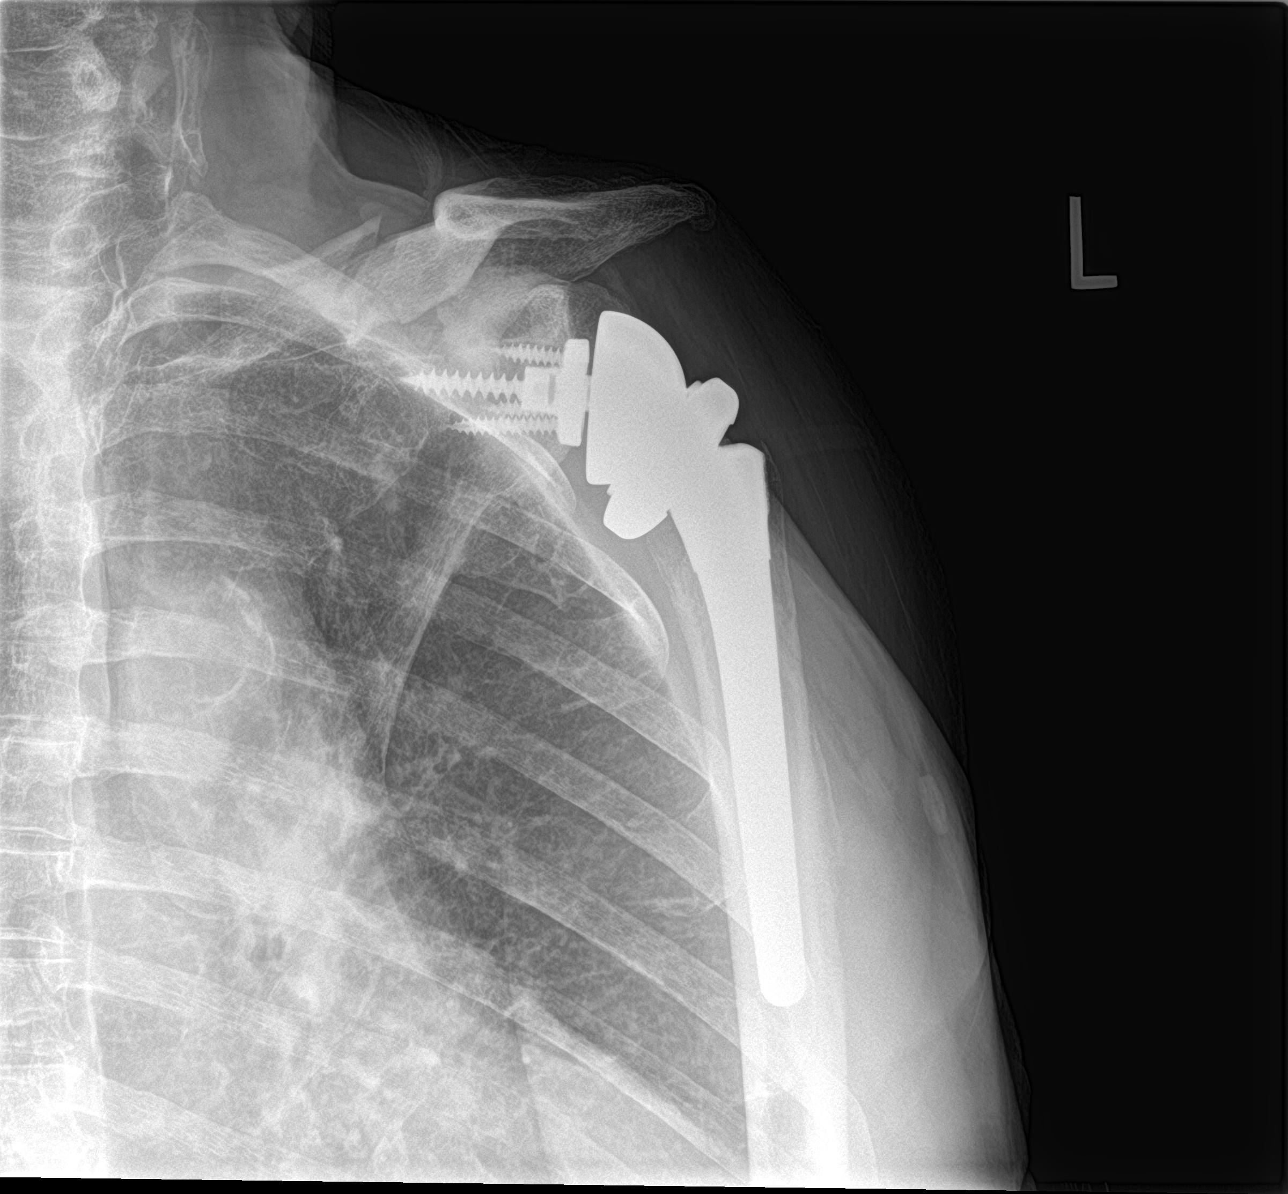

[shoulder y view]
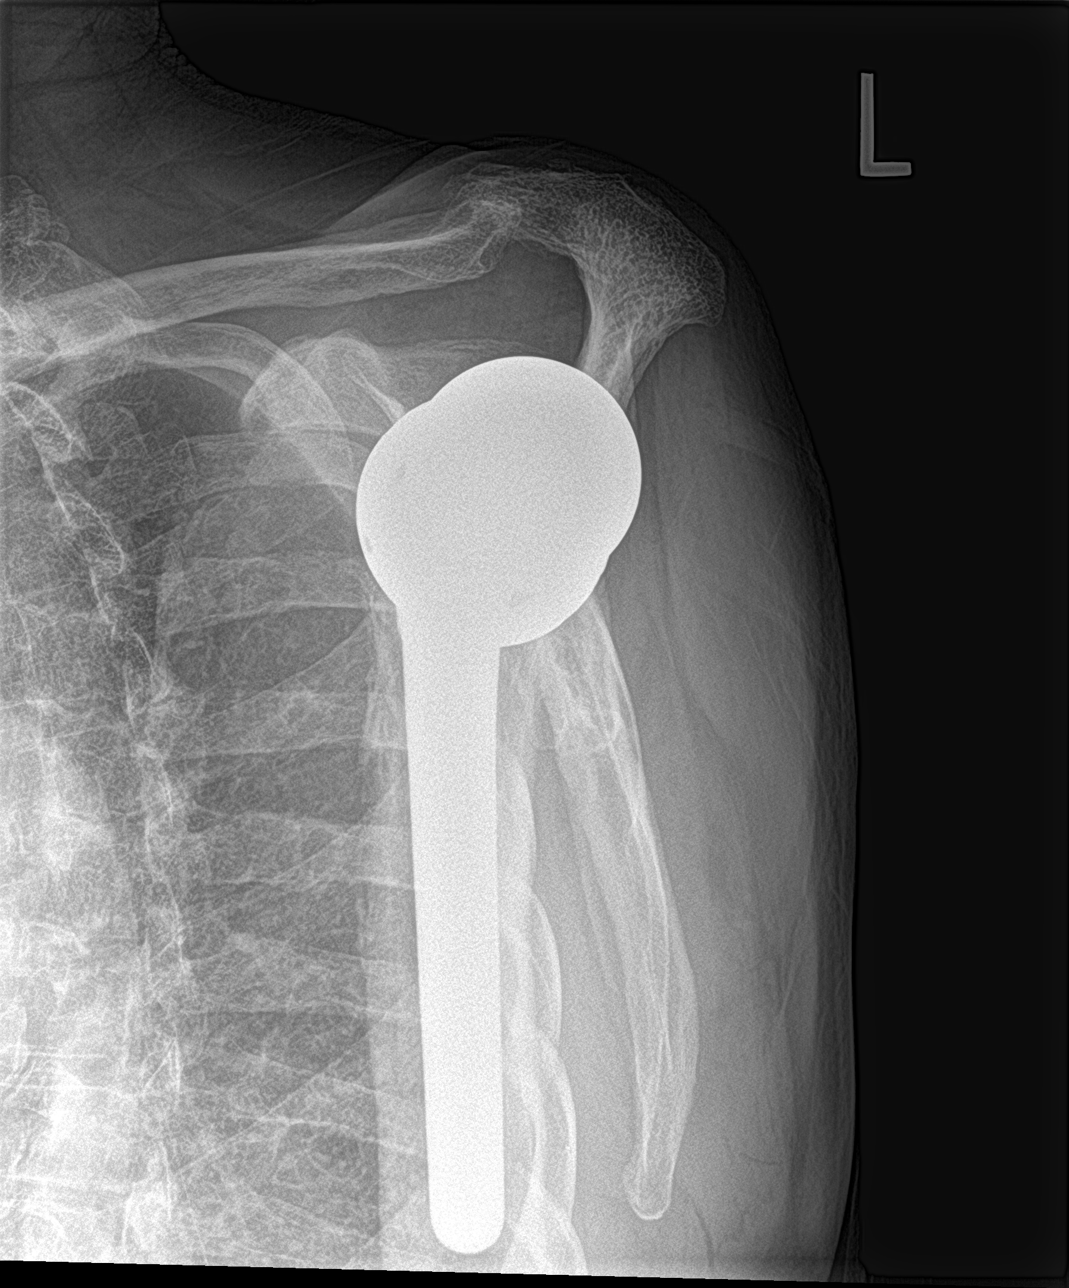

[shoulder axillary]
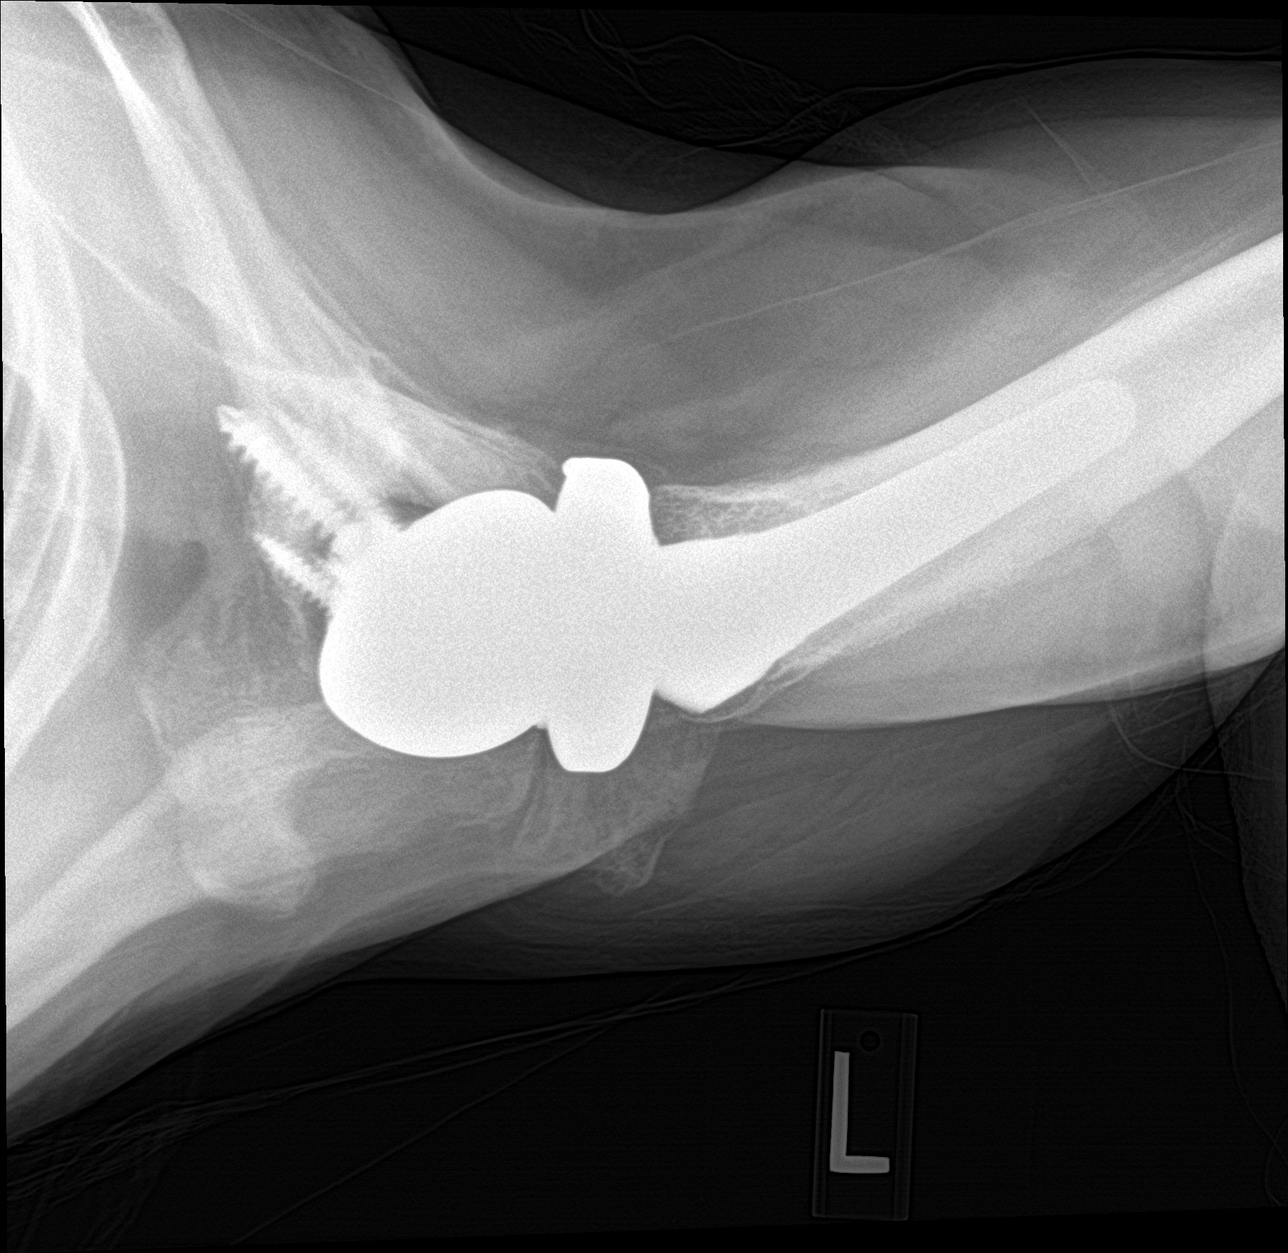

[3 of 3 positions shown; findings below may reference images not displayed]

FINDINGS: Acute oblique medial left clavicle fracture with 4 mm inferior
displacement of the lateral fracture fragment. No additional
fracture. Status post reverse total left shoulder arthroplasty with
no evidence of hardware fracture or loosening. No left shoulder
dislocation. No evidence of acromioclavicular separation. No
suspicious focal osseous lesions.
IMPRESSION: 1. Acute oblique medial left clavicle fracture.
2. Status post reverse total left shoulder arthroplasty, with no
evidence of hardware complication.

## 2020-04-16 MED ORDER — ESCITALOPRAM OXALATE 10 MG PO TABS
10.0000 mg | ORAL_TABLET | Freq: Every day | ORAL | 1 refills | Status: DC
Start: 2020-04-16 — End: 2020-05-28

## 2020-04-16 NOTE — Progress Notes (Signed)
HPI: Frederick Guzman is a 70 y.o. male who  has a past medical history of Allergy, Arthritis, Asthma, Cancer ([place]) (08/2014), COPD (chronic obstructive pulmonary disease) ([place]), Depression, Diabetes mellitus without complication ([place]), Diverticulitis, Dyspnea, Fibromyalgia, GERD (gastroesophageal reflux disease), Hyperlipemia, Hypertension, Lymphocytic colitis (04/2018), Pneumonia (06/2015, 10/2016), and Sleep apnea.  he presents to [facility] today, 04/16/20,  for chief complaint of:   Monitor chronic conditions and review preventive care   Uncontrolled anxiety - wellbutrin not helping as much   COPD seeing pulmonary, records not available, he is monitoring lung nodules and CPAP  L shoulder pain after fall on ice last week, concern for clavicle bump, very tender   White coat HTN - monitoring BP at home and typically 120s/70s    ASSESSMENT/PLAN: The primary encounter diagnosis was Injury of left shoulder, initial encounter. Diagnoses of Anxiety and depression, Smoker, White coat syndrome with diagnosis of hypertension, Encounter for abdominal aortic aneurysm (AAA) screening, and Osteoporosis screening were also pertinent to this visit.   Orders Placed This Encounter  Procedures  . DG Clavicle Left  . DG Shoulder Left  . DG Bone Density  . US AORTA MEDICARE SCREENING     Meds ordered this encounter  Medications  . escitalopram (LEXAPRO) 10 MG tablet    Sig: Take 1 tablet (10 mg total) by mouth daily.    Dispense:  90 tablet    Refill:  1    Patient Instructions  General Preventive Care  Most recent routine screening labs: ordered!   Blood pressure goal 130/80 or less. Monitors at home   Tobacco: don't! Please let me know if you need help quitting or cutting back!  Alcohol: responsible moderation is ok for most adults - if you have concerns about your alcohol intake, please talk to me!   Exercise: as tolerated to reduce risk of  cardiovascular disease and diabetes. Strength training will also prevent osteoporosis.   Mental health: if need for mental health care (medicines, counseling, other), or concerns about moods, please let me know!   Sexual / Reproductive health: if need for STD testing, or if concerns with libido/pain problems, please let me know!   Advanced Directive: Living Will and/or Healthcare Power of Attorney recommended for all adults, regardless of age or health. Has on file at home (partnered long term but not legally married)  Vaccines  Flu vaccine: recommended every fall.   Shingles vaccine: done, need records from pharmacy   Pneumonia vaccines: all done!  Tetanus booster: every 10 years - update 2023  COVID vaccine: THANKS for getting your vaccine! :) Cancer screenings   Colon cancer screening: Colonoscopy due 04/2025   Prostate cancer screening: PSA annual blood test age 22-71  Lung cancer screening: CT chest every year for follow-up on lung nodules - following w/ pulmonary  Infection screenings  . HIV and Hepatitis C: recommended screening at least once age 58-75 . Gonorrhea/Chlamydia: screening as needed . TB: certain at-risk populations, or depending on work requirements and/or travel history Other . Bone Density Test: recommended men at age 34 - ordered . Abdominal Aortic Aneurysm: screening with ultrasound recommended once for men age 44-75 who have ever smoked - ordered        Follow-up plan: Return in about 6 weeks (around 05/28/2020) for RECHECK [place].                                                 ################################################# ################################################# ################################################# #################################################  Current Meds  Medication Sig  . albuterol (PROVENTIL) (2.5 MG/3ML) 0.083% nebulizer solution Take  2.5 mg by nebulization every 6 (six) hours as needed for wheezing or shortness of breath.  Marland Kitchen albuterol (VENTOLIN HFA) 108 (90 Base) MCG/ACT inhaler Inhale 2 puffs into the lungs every 6 (six) hours as needed for wheezing or shortness of breath.  Marland Kitchen atorvastatin (LIPITOR) 20 MG tablet Take 1 tablet (20 mg total) by mouth every other day.  . b complex vitamins tablet Take 1 tablet by mouth daily.  . Budeson-Glycopyrrol-Formoterol (BREZTRI AEROSPHERE) 160-9-4.8 MCG/ACT AERO Inhale into the lungs.  Marland Kitchen buPROPion (WELLBUTRIN XL) 300 MG 24 hr tablet Take 1 tablet (300 mg total) by mouth daily.  . cetirizine (ZYRTEC) 10 MG tablet Take 10 mg by mouth daily as needed for allergies.   Marland Kitchen escitalopram (LEXAPRO) 10 MG tablet Take 1 tablet (10 mg total) by mouth daily.  Marland Kitchen esomeprazole (NEXIUM) 40 MG capsule Take 1 capsule (40 mg total) by mouth daily.  . fluticasone (FLONASE) 50 MCG/ACT nasal spray Place 1 spray into both nostrils daily as needed for allergies.  . furosemide (LASIX) 20 MG tablet Take 1 tablet (20 mg total) by mouth 2 (two) times daily as needed for edema.  Marland Kitchen glimepiride (AMARYL) 1 MG tablet Take 1 tablet (1 mg total) by mouth daily with breakfast.  . glucose blood test strip Use up to 4 times per day as directed with glucometer. Disp: 100. Refill x99  . lisinopril (ZESTRIL) 2.5 MG tablet Take 1 tablet (2.5 mg total) by mouth at bedtime.  . Multiple Vitamin (MULTIVITAMIN WITH MINERALS) TABS tablet Take 1 tablet by mouth at bedtime.  . Omega-3 Fatty Acids (FISH OIL) 1000 MG CAPS Take 1,000 mg by mouth at bedtime.   . Polyvinyl Alcohol-Povidone (REFRESH OP) Place 1 drop into both eyes daily as needed (dry eyes).  . tacrolimus (PROTOPIC) 0.1 % ointment Apply 1 application topically daily.  . tadalafil (CIALIS) 20 MG tablet Take 1 tablet (20 mg total) by mouth daily as needed for erectile dysfunction.  . Testosterone 30 MG/ACT SOLN APPLY 1-2 PUMPS DAILY. ALTERNATE TAKING ONE PUMP UNDER ONE ARM ONE DAY,  TWO PUMPS THE NEXT DAY (ONE PUMP UNDER EACH ARM)  . vitamin C (ASCORBIC ACID) 500 MG tablet Take 500 mg by mouth daily.    Allergies  Allergen Reactions  . Methotrexate Derivatives Nausea And Vomiting and Other (See Comments)    Hallucinations and aggressive behavior    . Tramadol Nausea And Vomiting    Hallucinations and aggressive behavior        Review of Systems: Pertinent (+) and (-) ROS in HPI as above   Exam:  BP 137/78   Pulse 80   Wt 201 lb 4.8 oz (91.3 kg)   SpO2 97%   BMI 26.56 kg/m   Constitutional: VS see above. General Appearance: alert, well-developed, well-nourished, NAD  Neck: No masses, trachea midline.   Respiratory: Normal respiratory effort. no wheeze, no rhonchi, no rales, diminshed breath sounds all fields   Cardiovascular: S1/S2 normal, no murmur, no rub/gallop auscultated. RRR.   Musculoskeletal: Gait normal. Painful nodule at proxmal L clavicle, shoulder OM limited d/t pain   Abdominal: non-tender, non-distended, no appreciable organomegaly, neg Murphy's, BS [id number]  Neurological: Normal balance/coordination. No tremor.  Skin: warm, dry, intact.   Psychiatric: Normal judgment/insight. Normal mood and affect. Oriented x3.       Visit summary with medication list and pertinent instructions was printed for patient to  review, patient was advised to alert [place] if any updates are needed. All questions at time of visit were answered - patient instructed to contact office with any additional concerns. ER/RTC precautions were reviewed with the patient and understanding verbalized.   Total time spent in this in-person encounter was 40 minutes performing my duty and my job as a physician: record review, history-taking and assessment of patient, counseling patient and ensuring understanding of plan, coordinating care which may include but is not limited to further testing or referral. Any modifiers needed may be added by billing department as necessary if I  have lapsed in doing so.     Please note: voice recognition software was used to produce this document, and typos may escape review. Please contact Dr. Sheppard Coil for any needed clarifications.    Follow up plan: Return in about 6 weeks (around 05/28/2020) for RECHECK [place].

## 2020-04-16 NOTE — Patient Instructions (Addendum)
General Preventive Care  Most recent routine screening labs: ordered!   Blood pressure goal 130/80 or less. Monitors at home   Tobacco: don't! Please let me know if you need help quitting or cutting back!  Alcohol: responsible moderation is ok for most adults - if you have concerns about your alcohol intake, please talk to me!   Exercise: as tolerated to reduce risk of cardiovascular disease and diabetes. Strength training will also prevent osteoporosis.   Mental health: if need for mental health care (medicines, counseling, other), or concerns about moods, please let me know!   Sexual / Reproductive health: if need for STD testing, or if concerns with libido/pain problems, please let me know!   Advanced Directive: Living Will and/or Healthcare Power of Attorney recommended for all adults, regardless of age or health. Has on file at home (partnered long term but not legally married)  Vaccines  Flu vaccine: recommended every fall.   Shingles vaccine: done, need records from pharmacy   Pneumonia vaccines: all done!  Tetanus booster: every 10 years - update 2023  COVID vaccine: THANKS for getting your vaccine! :) Cancer screenings   Colon cancer screening: Colonoscopy due 04/2025   Prostate cancer screening: PSA annual blood test age 28-71  Lung cancer screening: CT chest every year for follow-up on lung nodules - following w/ pulmonary  Infection screenings  . HIV and Hepatitis C: recommended screening at least once age 51-75 . Gonorrhea/Chlamydia: screening as needed . TB: certain at-risk populations, or depending on work requirements and/or travel history Other . Bone Density Test: recommended men at age 66 - ordered . Abdominal Aortic Aneurysm: screening with ultrasound recommended once for men age 22-75 who have ever smoked - ordered

## 2020-04-17 ENCOUNTER — Encounter: Payer: Self-pay | Admitting: Osteopathic Medicine

## 2020-04-17 LAB — COMPLETE METABOLIC PANEL WITH GFR
AG Ratio: 1.7 (calc) (ref 1.0–2.5)
ALT: 28 U/L (ref 9–46)
AST: 18 U/L (ref 10–35)
Albumin: 3.9 g/dL (ref 3.6–5.1)
Alkaline phosphatase (APISO): 84 U/L (ref 35–144)
BUN: 13 mg/dL (ref 7–25)
CO2: 30 mmol/L (ref 20–32)
Calcium: 9 mg/dL (ref 8.6–10.3)
Chloride: 106 mmol/L (ref 98–110)
Creat: 0.97 mg/dL (ref 0.70–1.18)
GFR, Est African American: 91 mL/min/{1.73_m2} (ref >=60)
GFR, Est Non African American: 79 mL/min/{1.73_m2} (ref >=60)
Globulin: 2.3 g/dL (calc) (ref 1.9–3.7)
Glucose, Bld: 142 mg/dL — ABNORMAL HIGH (ref 65–99)
Potassium: 4.5 mmol/L (ref 3.5–5.3)
Sodium: 141 mmol/L (ref 135–146)
Total Bilirubin: 0.6 mg/dL (ref 0.2–1.2)
Total Protein: 6.2 g/dL (ref 6.1–8.1)

## 2020-04-17 LAB — CBC
HCT: 44 % (ref 38.5–50.0)
Hemoglobin: 15.3 g/dL (ref 13.2–17.1)
MCH: 32 pg (ref 27.0–33.0)
MCHC: 34.8 g/dL (ref 32.0–36.0)
MCV: 92.1 fL (ref 80.0–100.0)
MPV: 9.4 fL (ref 7.5–12.5)
Platelets: 233 10*3/uL (ref 140–400)
RBC: 4.78 10*6/uL (ref 4.20–5.80)
RDW: 12.6 % (ref 11.0–15.0)
WBC: 6.3 10*3/uL (ref 3.8–10.8)

## 2020-04-17 LAB — HEMOGLOBIN A1C
Hgb A1c MFr Bld: 7.1 % of total Hgb — ABNORMAL HIGH (ref <5.7)
Mean Plasma Glucose: 157 mg/dL
eAG (mmol/L): 8.7 mmol/L

## 2020-04-17 LAB — LIPID PANEL
Cholesterol: 144 mg/dL (ref <200)
HDL: 42 mg/dL (ref >=40)
LDL Cholesterol (Calc): 85 mg/dL (calc)
Non-HDL Cholesterol (Calc): 102 mg/dL (calc) (ref <130)
Total CHOL/HDL Ratio: 3.4 (calc) (ref <5.0)
Triglycerides: 78 mg/dL (ref <150)

## 2020-04-17 LAB — PSA, TOTAL WITH REFLEX TO PSA, FREE: PSA, Total: 1.4 ng/mL (ref <=4.0)

## 2020-04-23 ENCOUNTER — Ambulatory Visit (INDEPENDENT_AMBULATORY_CARE_PROVIDER_SITE_OTHER): Payer: Medicare Other

## 2020-04-23 ENCOUNTER — Other Ambulatory Visit: Payer: Self-pay

## 2020-04-23 DIAGNOSIS — R29890 Loss of height: Secondary | ICD-10-CM | POA: Diagnosis not present

## 2020-04-23 DIAGNOSIS — Z13820 Encounter for screening for osteoporosis: Secondary | ICD-10-CM

## 2020-04-23 DIAGNOSIS — Z87891 Personal history of nicotine dependence: Secondary | ICD-10-CM | POA: Diagnosis not present

## 2020-04-23 DIAGNOSIS — Z87828 Personal history of other (healed) physical injury and trauma: Secondary | ICD-10-CM | POA: Diagnosis not present

## 2020-04-23 DIAGNOSIS — Z136 Encounter for screening for cardiovascular disorders: Secondary | ICD-10-CM | POA: Diagnosis not present

## 2020-04-23 DIAGNOSIS — F17200 Nicotine dependence, unspecified, uncomplicated: Secondary | ICD-10-CM | POA: Diagnosis not present

## 2020-04-23 DIAGNOSIS — Z8709 Personal history of other diseases of the respiratory system: Secondary | ICD-10-CM | POA: Diagnosis not present

## 2020-04-23 DIAGNOSIS — Z720 Tobacco use: Secondary | ICD-10-CM | POA: Diagnosis not present

## 2020-04-23 IMAGING — OT DEXA BONE DENSITY STUDY HL7
1 series · 4 of 4 positions shown · non-contrast
Comparison: none

EXAM:
DUAL X-RAY ABSORPTIOMETRY (DXA) FOR BONE MINERAL DENSITY

[Series 1: dxa reports · 4 of 5 slices shown]
[im 1/5]
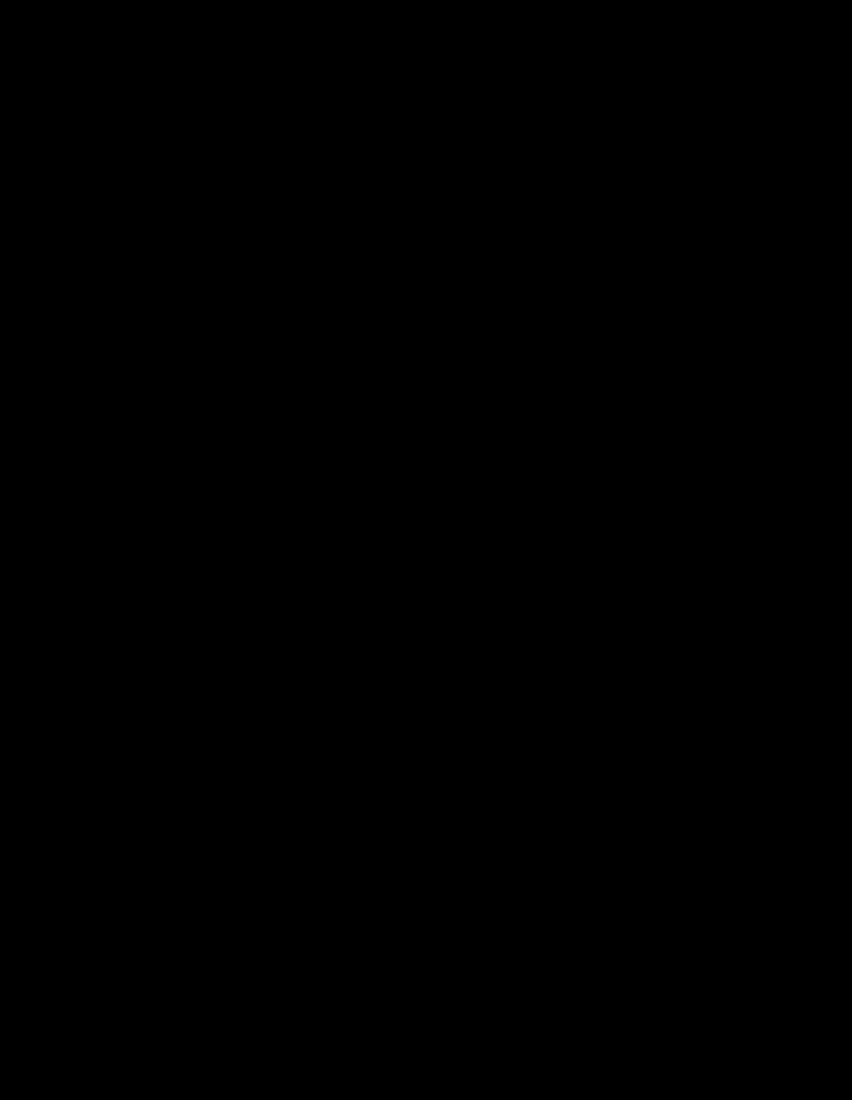
[im 2/5]
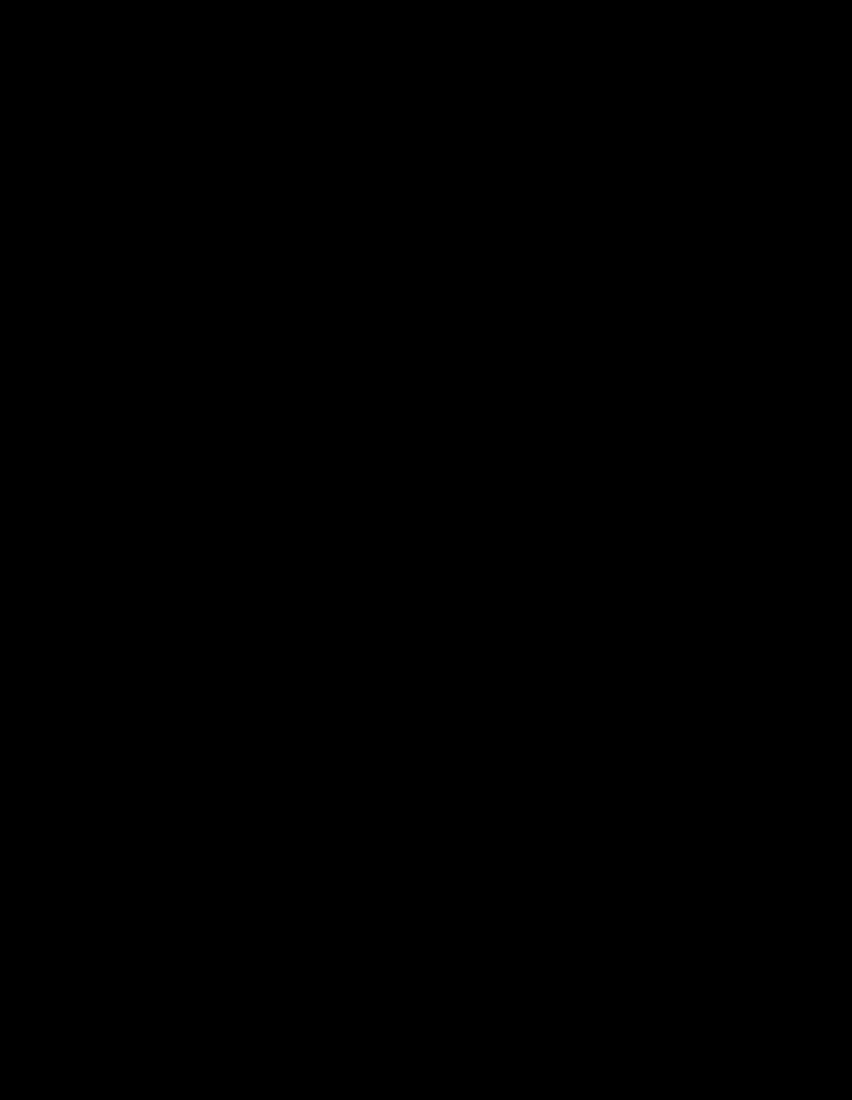
[im 3/5]
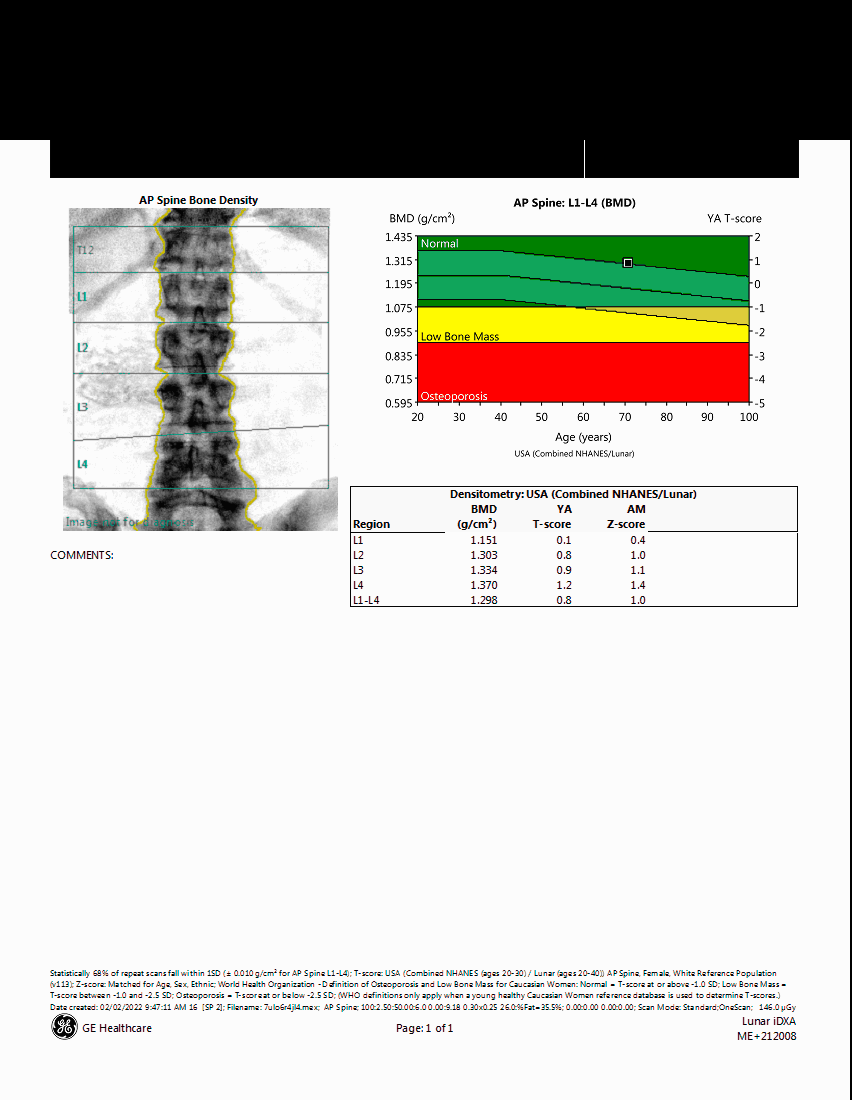
[im 5/5]
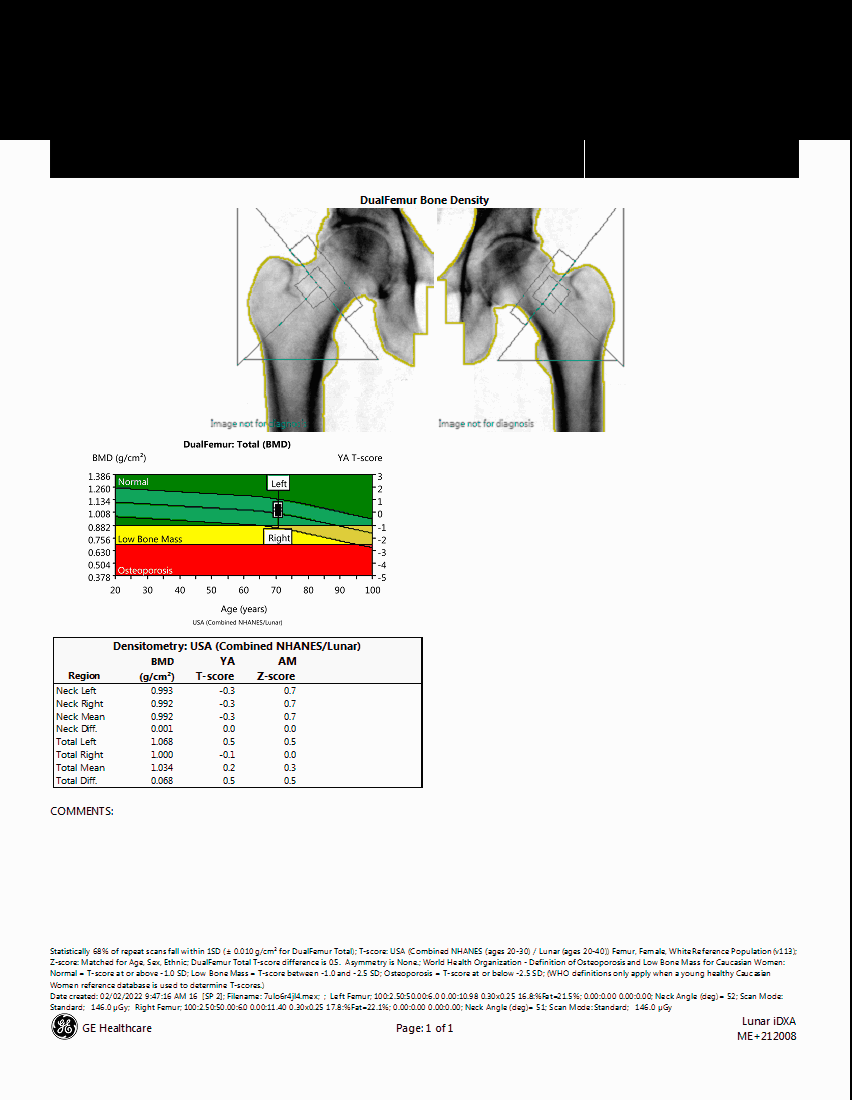

[4 of 4 positions shown; findings below may reference images not displayed]

IMPRESSION: TIGER

Your patient TIGER completed a BMD test on [DATE]
using the Lunar IDXA DXA System (analysis version: 16.SP2)
manufactured by [employer]. The following summarizes the results
of our evaluation. CAK

PATIENT:
Name: TIGER
Gender: Male Measured: [DATE] Weight: 198.0 lbs.
Indications: Advanced Age, Caucasian, COPD, Corticosteroid,
Diabetic, Glucocorticoids (Chronic), Height Loss, History of
Fracture (Adult), Low Calcium Intake, Tobacco User (Current Smoker)
Fractures: Clavicle Treatments: Breztri Aerosphere, Glimepride,
Multivitamin, Testosterone

ASSESSMENT:
The BMD measured at Femur Neck Right is 0.992 g/cm2 with a T-score
of -0.3 is NORMAL.

The scan quality is good.

Site Region Measured Date Measured Age WHO YA BMD
Classification T-score

AP Spine L1-L4 [DATE] 70.7 Normal 0.8 1.298 g/cm2

DualFemur Total Mean [DATE] 70.7 years Normal 0.2 1.034 g/cm2

World Health Organization (WHO) criteria for post-menopausal,
Caucasian Women:
Normal        T-score at or above -1 SD
Low Bone Mass T-score between -1 and -2.5 SD
Osteoporosis  T-score at or below -2.5 SD

RECOMMENDATION:
1. All patients should optimize calcium and vitamin D intake.
2. Consider FDA-approved medical therapies in postmenopausal women
and men age 50 years and older, based on the following:
a. A hip or vertebral(clinical or morphometric) fracture
b. T-score < -2.5 at the femoral neck or spine after appropriate
evaluation to exclude secondary causes
c. Low bone mass (T-score between -1.0 and -2.5 at the femoral neck
or spine) and a 10-year probability of a hip fracture > 3% or a
10-year probability of a major osteoporosis-related fracture > 20%
based on the [place]-adapted WHO algorithm
d. Clinician judgement and/or patient preferences may indicate
treatment for people with 10-year fracture probabilities above or
below these levels

FOLLOW-UP:
People with diagnosed cases of osteoporosis or osteopenia should be
regularly tested for bone mineral density. For patients eligible for
Medicare, routine testing is allowed once every 2 years. The testing
frequency can be increased to one year for patients who have rapidly
progressing disease, or for those who are receiving medical therapy
to restore bone mass.

I have reviewed this report, and agree with the above findings

## 2020-04-23 IMAGING — US US ABDOMINAL AORTA SCREENING AAA
1 series · 12 of 12 positions shown · non-contrast
Comparison: None.

CLINICAL DATA: Male between 65-75 years of age with a smoking
history.

EXAM:
US ABDOMINAL AORTA MEDICARE SCREENING
TECHNIQUE: Ultrasound examination of the abdominal aorta was performed as a
screening evaluation for abdominal aortic aneurysm.

[Series 1: us abdominal aorta screening aaa · 0.24mm/px · 12 of 12 slices shown]
[im 1/12]
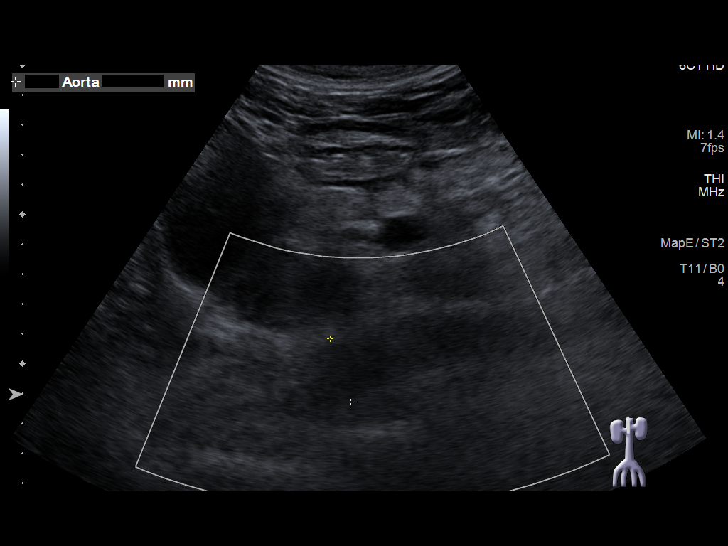
[im 2/12]
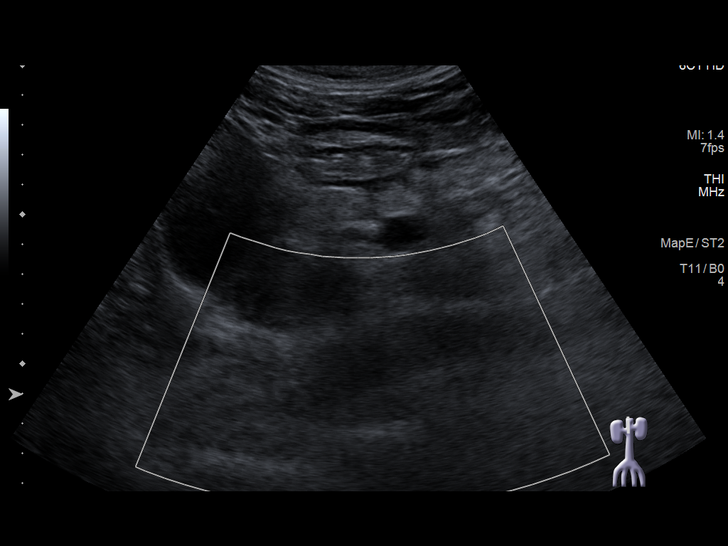
[im 3/12]
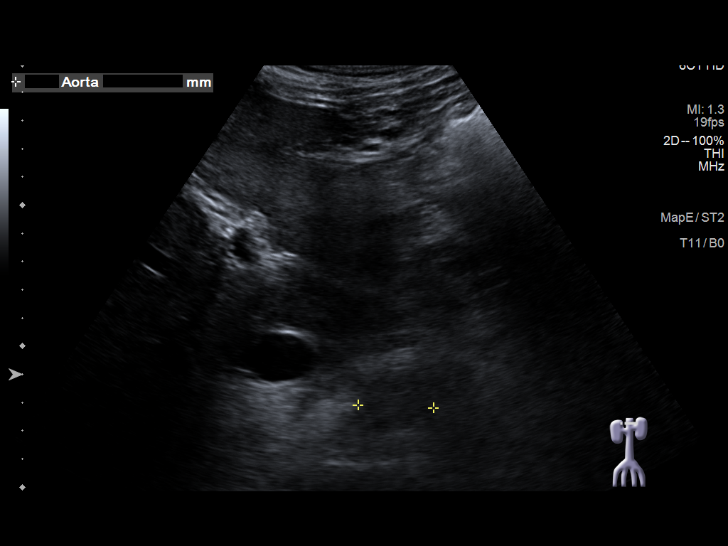
[im 4/12]
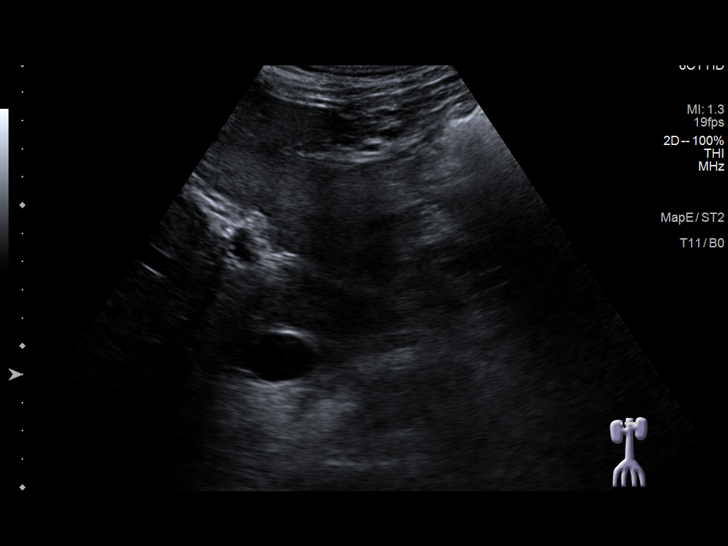
[im 5/12]
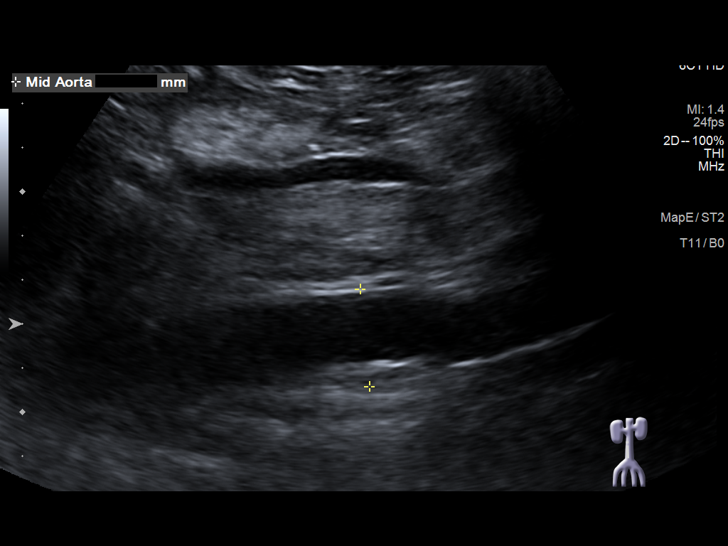
[im 6/12]
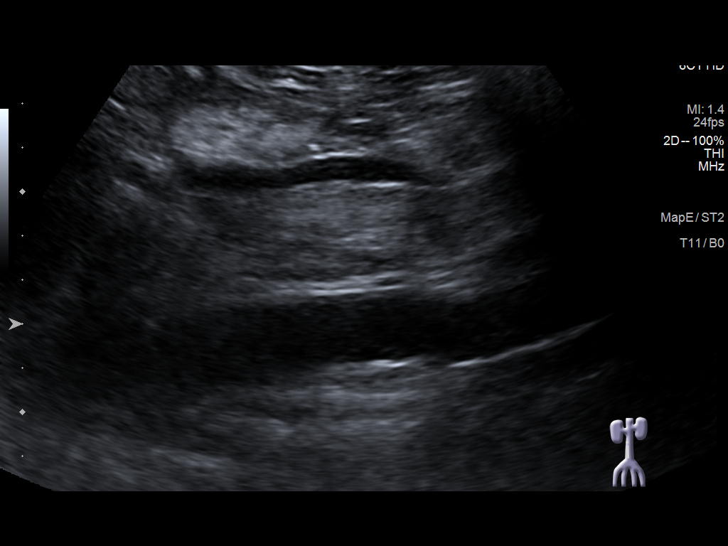
[im 7/12]
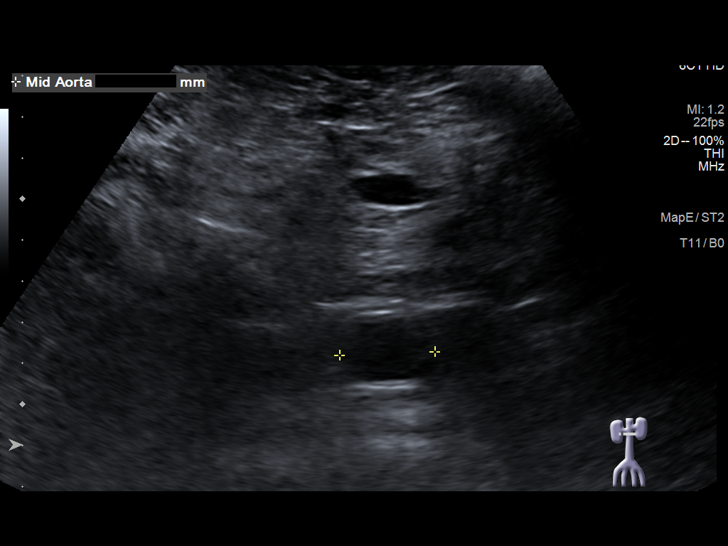
[im 8/12]
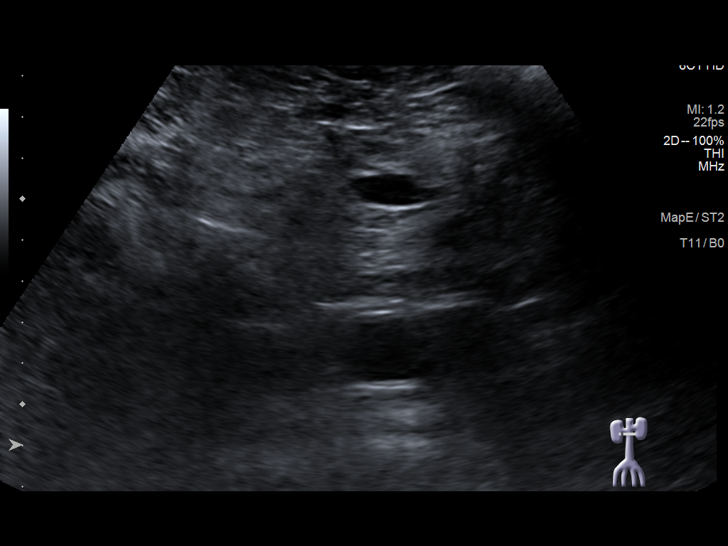
[im 9/12]
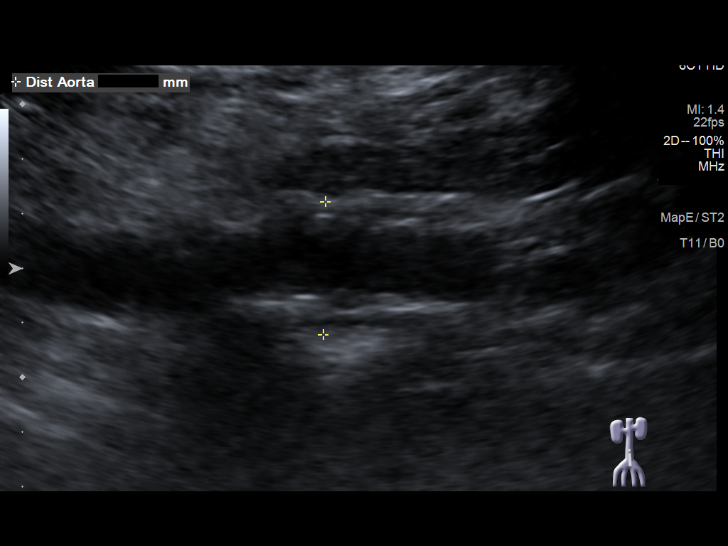
[im 10/12]
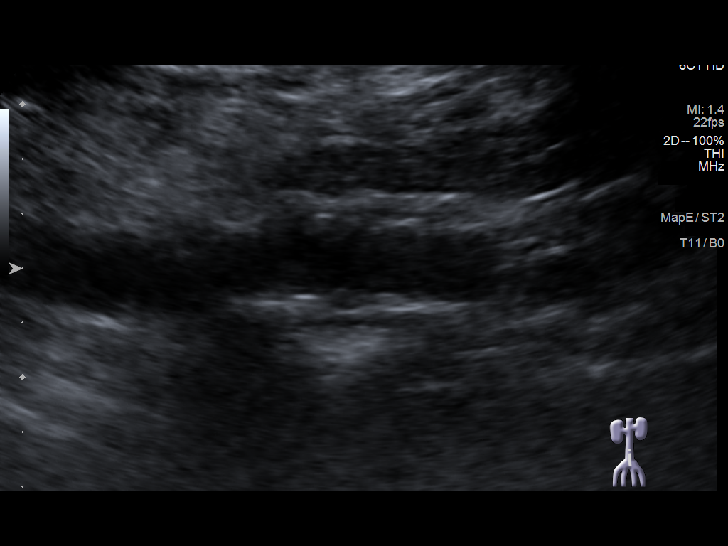
[im 11/12]
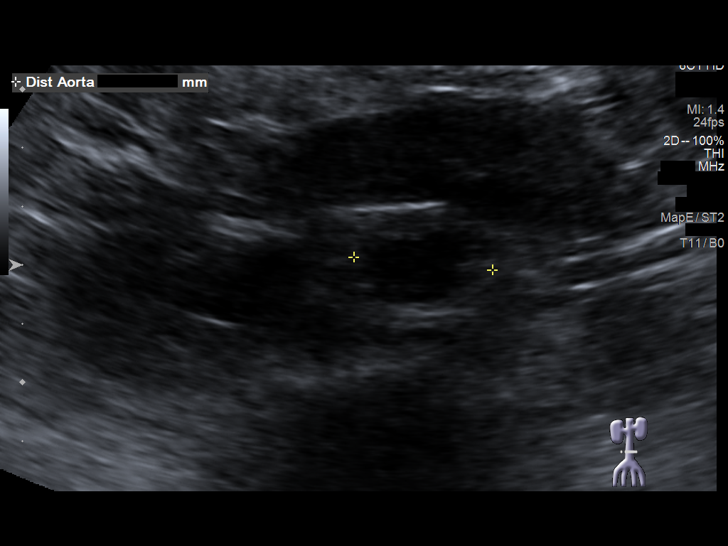
[im 12/12]
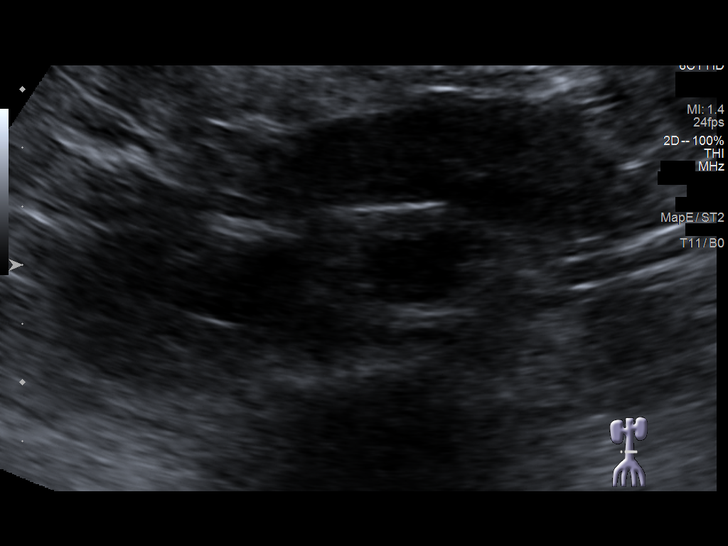

[12 of 12 positions shown; findings below may reference images not displayed]

FINDINGS: Aortic atherosclerotic plaque noted. Abdominal aortic measurements
as follows:

Proximal:  2.7 cm

Mid:  2.2 cm

Distal:  2.1 cm
IMPRESSION: No evidence of abdominal aortic aneurysm.

## 2020-05-02 DIAGNOSIS — J449 Chronic obstructive pulmonary disease, unspecified: Secondary | ICD-10-CM | POA: Diagnosis not present

## 2020-05-02 DIAGNOSIS — G4733 Obstructive sleep apnea (adult) (pediatric): Secondary | ICD-10-CM | POA: Diagnosis not present

## 2020-05-19 DIAGNOSIS — R918 Other nonspecific abnormal finding of lung field: Secondary | ICD-10-CM | POA: Diagnosis not present

## 2020-05-19 DIAGNOSIS — F17210 Nicotine dependence, cigarettes, uncomplicated: Secondary | ICD-10-CM | POA: Diagnosis not present

## 2020-05-19 DIAGNOSIS — G4733 Obstructive sleep apnea (adult) (pediatric): Secondary | ICD-10-CM | POA: Diagnosis not present

## 2020-05-19 DIAGNOSIS — J449 Chronic obstructive pulmonary disease, unspecified: Secondary | ICD-10-CM | POA: Diagnosis not present

## 2020-05-22 DIAGNOSIS — H35371 Puckering of macula, right eye: Secondary | ICD-10-CM | POA: Diagnosis not present

## 2020-05-28 ENCOUNTER — Encounter: Payer: Self-pay | Admitting: Osteopathic Medicine

## 2020-05-28 ENCOUNTER — Other Ambulatory Visit: Payer: Self-pay

## 2020-05-28 ENCOUNTER — Ambulatory Visit (INDEPENDENT_AMBULATORY_CARE_PROVIDER_SITE_OTHER): Payer: Medicare Other | Admitting: Osteopathic Medicine

## 2020-05-28 VITALS — BP 146/79 | HR 72 | Temp 97.8°F | Wt 203.0 lb

## 2020-05-28 DIAGNOSIS — F411 Generalized anxiety disorder: Secondary | ICD-10-CM | POA: Diagnosis not present

## 2020-05-28 MED ORDER — VORTIOXETINE HBR 5 MG PO TABS: 5.0000 mg | ORAL_TABLET | Freq: Every day | ORAL | 1 refills | Status: DC

## 2020-05-28 NOTE — Patient Instructions (Addendum)
Plan for now:  Wellbutrin 300 mg + Lexapro down from 10 mg to 5 mg (half pill)  I sent Rx for Trintellix  If/when you're able to get this, STOP Wellbutrin and Lexapro and START Trintellix the next day

## 2020-05-28 NOTE — Progress Notes (Signed)
Frederick Guzman is a 70 y.o. male who presents to  [place] at [facility]  today, 05/28/20, seeking care for the following:  . RECHECK MENTAL HEALTH - last visit 6 weeks ago we started Lexapro 10 mg since Wellbutrin wasn't helping his anxiety as much. He had been on Wellbutrin several years 150 mg usually, up to 300 mg when he had a road trip coming up since it seemed to help w/ road rage. Lexapro has helped mood greatly but he is not happy w/ sexual side effects and not happy with taking 2 Rx (wellbutrin + lexapro)     ASSESSMENT & PLAN with other pertinent findings:  The encounter diagnosis was Generalized anxiety disorder.    Patient Instructions  Plan for now:  Wellbutrin 300 mg + Lexapro down from 10 mg to 5 mg (half pill)  I sent Rx for Trintellix  If/when you're able to get this, STOP Wellbutrin and Lexapro and START Trintellix the next day      No orders of the defined types were placed in this encounter.   Meds ordered this encounter  Medications  . vortioxetine HBr (TRINTELLIX) 5 MG TABS tablet    Sig: Take 1 tablet (5 mg total) by mouth daily.    Dispense:  90 tablet    Refill:  1    If PA required - pt has tried/failed Lexapro, Cymbalta, and Wellbutrin     See below for relevant physical exam findings  See below for recent lab and imaging results reviewed  Medications, allergies, PMH, PSH, SocH, FamH reviewed below    Follow-up instructions: Return in about 6 weeks (around 07/09/2020) for RECHECK [place].                                        Exam:  BP (!) 146/79 (BP Location: Left Arm, Patient Position: Sitting, Cuff Size: Large)   Pulse 72   Temp 97.8 F (36.6 C) (Oral)   Wt 203 lb 0.6 oz (92.1 kg)   BMI 26.79 kg/m   Constitutional: VS see above. General Appearance: alert, well-developed, well-nourished, NAD  Neck: No masses, trachea  midline.   Respiratory: Normal respiratory effort.  Musculoskeletal: Gait normal. Symmetric and independent movement of all extremities  Neurological: Normal balance/coordination. No tremor.  Skin: warm, dry, intact.   Psychiatric: Normal judgment/insight. Normal mood and affect. Oriented x3.   Current Meds  Medication Sig  . albuterol (PROVENTIL) (2.5 MG/3ML) 0.083% nebulizer solution Take 2.5 mg by nebulization every 6 (six) hours as needed for wheezing or shortness of breath.  Marland Kitchen albuterol (VENTOLIN HFA) 108 (90 Base) MCG/ACT inhaler Inhale 2 puffs into the lungs every 6 (six) hours as needed for wheezing or shortness of breath.  Marland Kitchen atorvastatin (LIPITOR) 20 MG tablet Take 1 tablet (20 mg total) by mouth every other day.  . b complex vitamins tablet Take 1 tablet by mouth daily.  . Budeson-Glycopyrrol-Formoterol (BREZTRI AEROSPHERE) 160-9-4.8 MCG/ACT AERO Inhale into the lungs.  . cetirizine (ZYRTEC) 10 MG tablet Take 10 mg by mouth daily as needed for allergies.   Marland Kitchen esomeprazole (NEXIUM) 40 MG capsule Take 1 capsule (40 mg total) by mouth daily.  . fluticasone (FLONASE) 50 MCG/ACT nasal spray Place 1 spray into both nostrils daily as needed for allergies.  . furosemide (LASIX) 20 MG tablet Take 1 tablet (  20 mg total) by mouth 2 (two) times daily as needed for edema.  Marland Kitchen glimepiride (AMARYL) 1 MG tablet Take 1 tablet (1 mg total) by mouth daily with breakfast.  . glucose blood test strip Use up to 4 times per day as directed with glucometer. Disp: 100. Refill x99  . lisinopril (ZESTRIL) 2.5 MG tablet Take 1 tablet (2.5 mg total) by mouth at bedtime.  . Multiple Vitamin (MULTIVITAMIN WITH MINERALS) TABS tablet Take 1 tablet by mouth at bedtime.  . Omega-3 Fatty Acids (FISH OIL) 1000 MG CAPS Take 1,000 mg by mouth at bedtime.   . Polyvinyl Alcohol-Povidone (REFRESH OP) Place 1 drop into both eyes daily as needed (dry eyes).  . tacrolimus (PROTOPIC) 0.1 % ointment Apply 1 application  topically daily.  . tadalafil (CIALIS) 20 MG tablet Take 1 tablet (20 mg total) by mouth daily as needed for erectile dysfunction.  . Testosterone 30 MG/ACT SOLN APPLY 1-2 PUMPS DAILY. ALTERNATE TAKING ONE PUMP UNDER ONE ARM ONE DAY, TWO PUMPS THE NEXT DAY (ONE PUMP UNDER EACH ARM)  . vitamin C (ASCORBIC ACID) 500 MG tablet Take 500 mg by mouth daily.  Marland Kitchen vortioxetine HBr (TRINTELLIX) 5 MG TABS tablet Take 1 tablet (5 mg total) by mouth daily.  . [DISCONTINUED] buPROPion (WELLBUTRIN XL) 300 MG 24 hr tablet Take 1 tablet (300 mg total) by mouth daily.  . [DISCONTINUED] escitalopram (LEXAPRO) 10 MG tablet Take 1 tablet (10 mg total) by mouth daily.    Allergies  Allergen Reactions  . Methotrexate Derivatives Nausea And Vomiting and Other (See Comments)    Hallucinations and aggressive behavior    . Tramadol Nausea And Vomiting    Hallucinations and aggressive behavior     Patient Active Problem List   Diagnosis Date Noted  . Testosterone deficiency in male 04/10/2018  . Pulmonary nodules 10/23/2017  . Sepsis, unspecified organism ([place]) 10/23/2017  . 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 Positive depression screening 09/21/2016  . Uncontrolled type 2 diabetes mellitus with hyperglycemia, without long-term current use of insulin ([place]) 09/21/2016  . Type 2 diabetes mellitus without complications ([place]) 81/82/9937  . Status post shoulder surgery 08/09/2016  . Prosthetic shoulder infection, subsequent encounter 08/09/2016  . Infection and inflammatory reaction due to internal orthopedic device, implant, and graft ([place]) 03/31/2016  . S/P shoulder replacement, left 01/30/2016  . HCAP (healthcare-associated pneumonia) 07/13/2015  . Depression   . HTN (hypertension)   . Asthma exacerbation   . MDD (major depressive disorder) ([place]) 04/30/2011  . GERD 11/23/2010  . DERMATITIS, ATOPIC 11/23/2010  . TOBACCO ABUSE 03/10/2007  . OBSTRUCTIVE SLEEP APNEA 03/10/2007  . BRONCHITIS, CHRONIC 03/10/2007  .  ASTHMA, MODERATE 03/10/2007    Family History  Problem Relation Age of Onset  . Diabetes Mother   . Colon cancer Father   . Prostate cancer Father   . Diabetes Father   . Esophageal cancer Neg Hx   . Rectal cancer Neg Hx   . Stomach cancer Neg Hx     Social History   Tobacco Use  Smoking Status Current Every Day Smoker  . Packs/day: 1.50  . Years: 43.00  . Pack years: 64.50  . Types: Cigarettes  Smokeless Tobacco Never Used  Tobacco Comment   pt no longer taking chantix    Past Surgical History:  Procedure Laterality Date  . carpel tunnel Bilateral   . COLONOSCOPY W/ POLYPECTOMY    . COLONOSCOPY W/ POLYPECTOMY    . EXCISIONAL TOTAL SHOULDER ARTHROPLASTY WITH ANTIBIOTIC  SPACER Left 08/09/2016   Procedure: LEFT SHOULDER HARDWARE REMOVAL, IRRIGATION AND DEBRIDEMENT, PLACEMENT OF ANTIBIOTIC SPACER;  Surgeon: Netta Cedars, MD;  Location: [place];  Service: Orthopedics;  Laterality: Left;  . IRRIGATION AND DEBRIDEMENT SHOULDER Left 03/31/2016   Procedure: IRRIGATION AND DEBRIDEMENT SHOULDER, deep cultures and polyethylene exchange;  Surgeon: Netta Cedars, MD;  Location: [place];  Service: Orthopedics;  Laterality: Left;  requests 53mins  . REVERSE SHOULDER ARTHROPLASTY Left 01/30/2016   Procedure: LEFT REVERSE SHOULDER ARTHROPLASTY;  Surgeon: Netta Cedars, MD;  Location: [place];  Service: Orthopedics;  Laterality: Left;  . ROTATOR CUFF REPAIR Left   . SHOULDER ARTHROSCOPY WITH DISTAL CLAVICLE RESECTION Right 01/30/2018   Procedure: RIGHT SHOULDER ARTHROSCOPY WITH OPEN DISTAL CLAVICLE RESECTION, SUBACROMIAL DECOMPRESSION;  Surgeon: Netta Cedars, MD;  Location: [place];  Service: Orthopedics;  Laterality: Right;  . STERIOD INJECTION Right 01/30/2018   Procedure: STEROID INJECTION;  Surgeon: Netta Cedars, MD;  Location: [place];  Service: Orthopedics;  Laterality: Right;  . TOTAL SHOULDER REVISION Left 01/17/2017   Procedure: LEFT SHOULDER REVISION REVERSE TOTAL SHOULDER ARTHROPLASTY;   Surgeon: Netta Cedars, MD;  Location: [place];  Service: Orthopedics;  Laterality: Left;  . WRIST SURGERY Right 11/2014   Dr. Amedeo Plenty    Immunization History  Administered Date(s) Administered  . Fluad Quad(high Dose 65+) 11/14/2018  . Influenza, High Dose Seasonal PF 12/15/2016, 01/19/2018  . Influenza, Seasonal, Injecte, Preservative Fre 11/27/2015  . Influenza-Unspecified 02/14/2012, 12/27/2012, 12/19/2019  . PFIZER(Purple Top)SARS-COV-2 Vaccination 04/13/2019, 05/04/2019, 01/02/2020  . Pneumococcal Conjugate-13 03/18/2015  . Pneumococcal Polysaccharide-23 01/19/2008, 12/15/2016  . Tdap 05/31/2011    Recent Results (from the past 2160 hour(s))  CBC     Status: None   Collection Time: 04/16/20 12:00 AM  Result Value Ref Range   WBC 6.3 3.8 - 10.8 Thousand/uL   RBC 4.78 4.20 - 5.80 Million/uL   Hemoglobin 15.3 13.2 - 17.1 g/dL   HCT 44.0 38.5 - 50.0 %   MCV 92.1 80.0 - 100.0 fL   MCH 32.0 27.0 - 33.0 pg   MCHC 34.8 32.0 - 36.0 g/dL   RDW 12.6 11.0 - 15.0 %   Platelets 233 140 - 400 Thousand/uL   MPV 9.4 7.5 - 12.5 fL  COMPLETE METABOLIC PANEL WITH GFR     Status: Abnormal   Collection Time: 04/16/20 12:00 AM  Result Value Ref Range   Glucose, Bld 142 (H) 65 - 99 mg/dL    Comment: .            Fasting reference interval . For someone without known diabetes, a glucose value >125 mg/dL indicates that they may have diabetes and this should be confirmed with a follow-up test. .    BUN 13 7 - 25 mg/dL   Creat 0.97 0.70 - 1.18 mg/dL    Comment: For patients >64 years of age, the reference limit for Creatinine is approximately 13% higher for people identified as African-American. .    GFR, Est Non African American 79 > OR = 60 mL/min/1.63m2   GFR, Est African American 91 > OR = 60 mL/min/1.72m2   BUN/Creatinine Ratio NOT APPLICABLE 6 - 22 (calc)   Sodium 141 135 - 146 mmol/L   Potassium 4.5 3.5 - 5.3 mmol/L   Chloride 106 98 - 110 mmol/L   CO2 30 20 - 32 mmol/L    Calcium 9.0 8.6 - 10.3 mg/dL   Total Protein 6.2 6.1 - 8.1 g/dL   Albumin 3.9 3.6 - 5.1  g/dL   Globulin 2.3 1.9 - 3.7 g/dL (calc)   AG Ratio 1.7 1.0 - 2.5 (calc)   Total Bilirubin 0.6 0.2 - 1.2 mg/dL   Alkaline phosphatase (APISO) 84 35 - 144 U/L   AST 18 10 - 35 U/L   ALT 28 9 - 46 U/L  Lipid panel     Status: None   Collection Time: 04/16/20 12:00 AM  Result Value Ref Range   Cholesterol 144 <200 mg/dL   HDL 42 > OR = 40 mg/dL   Triglycerides 78 <150 mg/dL   LDL Cholesterol (Calc) 85 mg/dL (calc)    Comment: Reference range: <100 . Desirable range <100 mg/dL for primary prevention;   <70 mg/dL for patients with CHD or diabetic patients  with > or = 2 CHD risk factors. Marland Kitchen LDL-C is now calculated using the Martin-Hopkins  calculation, which is a validated novel method providing  better accuracy than the Friedewald equation in the  estimation of LDL-C.  Cresenciano Genre et al. Annamaria Helling. 5027;741(28): 2061-2068  (http://education.[employer].com/faq/FAQ164)    Total CHOL/HDL Ratio 3.4 <5.0 (calc)   Non-HDL Cholesterol (Calc) 102 <130 mg/dL (calc)    Comment: For patients with diabetes plus 1 major ASCVD risk  factor, treating to a non-HDL-C goal of <100 mg/dL  (LDL-C of <70 mg/dL) is considered a therapeutic  option.   Hemoglobin A1c     Status: Abnormal   Collection Time: 04/16/20 12:00 AM  Result Value Ref Range   Hgb A1c MFr Bld 7.1 (H) <5.7 % of total Hgb    Comment: For someone without known diabetes, a hemoglobin A1c value of 6.5% or greater indicates that they may have  diabetes and this should be confirmed with a follow-up  test. . For someone with known diabetes, a value <7% indicates  that their diabetes is well controlled and a value  greater than or equal to 7% indicates suboptimal  control. A1c targets should be individualized based on  duration of diabetes, age, comorbid conditions, and  other considerations. . Currently, no consensus exists regarding use  of hemoglobin A1c for diagnosis of diabetes for children. .    Mean Plasma Glucose 157 mg/dL   eAG (mmol/L) 8.7 mmol/L  PSA, Total with Reflex to PSA, Free     Status: None   Collection Time: 04/16/20 12:00 AM  Result Value Ref Range   PSA, Total 1.4 < OR = 4.0 ng/mL    Comment: The Total PSA value from this assay system is  standardized against the equimolar PSA standard.  The test result will be approximately 20% higher  when compared to the [facility] Total PSA  (Siemens assay). Comparison of serial PSA results  should be interpreted with this fact in mind. Marland Kitchen PSA was performed using the Beckman Coulter  Immunoassay method. Values obtained from different  assay methods cannot be used interchangeably. PSA  levels, regardless of value, should not be  interpreted as absolute evidence of the presence or  absence of disease.     No results found.     All questions at time of visit were answered - patient instructed to contact office with any additional concerns or updates. ER/RTC precautions were reviewed with the patient as applicable.   Please note: manual typing as well as voice recognition software may have been used to produce this document - typos may escape review. Please contact Dr. Sheppard Coil for any needed clarifications.

## 2020-06-05 ENCOUNTER — Encounter: Payer: Self-pay | Admitting: Osteopathic Medicine

## 2020-06-19 ENCOUNTER — Other Ambulatory Visit: Payer: Self-pay | Admitting: Osteopathic Medicine

## 2020-06-19 MED ORDER — DULOXETINE HCL 30 MG PO CPEP: 30.0000 mg | ORAL_CAPSULE | Freq: Every day | ORAL | 0 refills | Status: DC

## 2020-07-09 ENCOUNTER — Ambulatory Visit: Payer: Medicare Other | Admitting: Osteopathic Medicine

## 2020-07-11 DIAGNOSIS — G4733 Obstructive sleep apnea (adult) (pediatric): Secondary | ICD-10-CM | POA: Diagnosis not present

## 2020-07-11 DIAGNOSIS — J449 Chronic obstructive pulmonary disease, unspecified: Secondary | ICD-10-CM | POA: Diagnosis not present

## 2020-07-23 ENCOUNTER — Encounter: Payer: Self-pay | Admitting: Osteopathic Medicine

## 2020-07-23 ENCOUNTER — Other Ambulatory Visit: Payer: Self-pay

## 2020-07-23 ENCOUNTER — Ambulatory Visit (INDEPENDENT_AMBULATORY_CARE_PROVIDER_SITE_OTHER): Payer: Medicare Other | Admitting: Osteopathic Medicine

## 2020-07-23 VITALS — BP 140/65 | HR 76 | Temp 98.1°F | Wt 202.1 lb

## 2020-07-23 DIAGNOSIS — E1169 Type 2 diabetes mellitus with other specified complication: Secondary | ICD-10-CM

## 2020-07-23 LAB — POCT GLYCOSYLATED HEMOGLOBIN (HGB A1C): Hemoglobin A1C: 7.9 % — AB (ref 4.0–5.6)

## 2020-07-23 NOTE — Patient Instructions (Signed)
Plan: Increase Cymbalta from 30 mg to 60 mg Work on diet/exercise to help sugars

## 2020-07-23 NOTE — Progress Notes (Signed)
Frederick Guzman is a 70 y.o. male who presents to  [place] at [facility]  today, 07/23/20, seeking care for the following:  Mental health recheck:  Previously on Wellbutrin 150 mg / 300 mg  Added Lexapro for anxiety but this caused sexual side effects  Unable to get Trintellix d/t $  We started Cymbalta 30 mg daily about 6 weeks prior to this visit   Reports doing okay on Cymbalta, inquires about incresasing dose   DM2 rechec  A1C today 07/23/20 is 7.9, was 7.1 on 04/16/20, 1 year ago was 6.8  Rx: currently taking Glimepiride 1 mg daily (intolerant to metformin, was on Farxiga in 2018 stopped d/t cost)       ASSESSMENT & PLAN with other pertinent findings:  The encounter diagnosis was Type 2 diabetes mellitus with other specified complication, without long-term current use of insulin ([place]).    Patient Instructions  Plan: Increase Cymbalta from 30 mg to 60 mg Work on diet/exercise to help sugars    Orders Placed This Encounter  Procedures  . POCT HgB A1C    No orders of the defined types were placed in this encounter.    See below for relevant physical exam findings  See below for recent lab and imaging results reviewed  Medications, allergies, PMH, PSH, SocH, FamH reviewed below    Follow-up instructions: Return in about 4 months (around 11/23/2020) for MONITOR A1C, SEE ME SOONER AS NEEDED FOR MENTAL HEALTH .                                        Exam:  BP 140/65 (BP Location: Left Arm, Patient Position: Sitting, Cuff Size: Normal)   Pulse 76   Temp 98.1 F (36.7 C) (Oral)   Wt 202 lb 1.9 oz (91.7 kg)   BMI 26.67 kg/m   Constitutional: VS see above. General Appearance: alert, well-developed, well-nourished, NAD  Neck: No masses, trachea midline.   Respiratory: Normal respiratory effort. no wheeze, no rhonchi, no rales  Cardiovascular: S1/S2 normal, no murmur,  no rub/gallop auscultated. RRR.   Musculoskeletal: Gait normal. Symmetric and independent movement of all extremities  Neurological: Normal balance/coordination. No tremor.  Skin: warm, dry, intact.   Psychiatric: Normal judgment/insight. Normal mood and affect. Oriented x3.   Current Meds  Medication Sig  . albuterol (PROVENTIL) (2.5 MG/3ML) 0.083% nebulizer solution Take 2.5 mg by nebulization every 6 (six) hours as needed for wheezing or shortness of breath.  Marland Kitchen albuterol (VENTOLIN HFA) 108 (90 Base) MCG/ACT inhaler Inhale 2 puffs into the lungs every 6 (six) hours as needed for wheezing or shortness of breath.  Marland Kitchen atorvastatin (LIPITOR) 20 MG tablet Take 1 tablet (20 mg total) by mouth every other day.  . b complex vitamins tablet Take 1 tablet by mouth daily.  . Budeson-Glycopyrrol-Formoterol (BREZTRI AEROSPHERE) 160-9-4.8 MCG/ACT AERO Inhale into the lungs.  . cetirizine (ZYRTEC) 10 MG tablet Take 10 mg by mouth daily as needed for allergies.   . DULoxetine (CYMBALTA) 30 MG capsule Take 1 capsule (30 mg total) by mouth daily.  Marland Kitchen esomeprazole (NEXIUM) 40 MG capsule Take 1 capsule (40 mg total) by mouth daily.  . fluticasone (FLONASE) 50 MCG/ACT nasal spray Place 1 spray into both nostrils daily as needed for allergies.  . furosemide (LASIX) 20 MG tablet Take 1 tablet (20 mg total) by mouth  2 (two) times daily as needed for edema.  Marland Kitchen glimepiride (AMARYL) 1 MG tablet Take 1 tablet (1 mg total) by mouth daily with breakfast.  . glucose blood test strip Use up to 4 times per day as directed with glucometer. Disp: 100. Refill x99  . lisinopril (ZESTRIL) 2.5 MG tablet Take 1 tablet (2.5 mg total) by mouth at bedtime.  . Multiple Vitamin (MULTIVITAMIN WITH MINERALS) TABS tablet Take 1 tablet by mouth at bedtime.  . Omega-3 Fatty Acids (FISH OIL) 1000 MG CAPS Take 1,000 mg by mouth at bedtime.   . Polyvinyl Alcohol-Povidone (REFRESH OP) Place 1 drop into both eyes daily as needed (dry eyes).   . tacrolimus (PROTOPIC) 0.1 % ointment Apply 1 application topically daily.  . tadalafil (CIALIS) 20 MG tablet Take 1 tablet (20 mg total) by mouth daily as needed for erectile dysfunction.  . tadalafil (CIALIS) 5 MG tablet Take 5 mg by mouth daily. For BPH - Rx from urology  . Testosterone 30 MG/ACT SOLN APPLY 1-2 PUMPS DAILY. ALTERNATE TAKING ONE PUMP UNDER ONE ARM ONE DAY, TWO PUMPS THE NEXT DAY (ONE PUMP UNDER EACH ARM)  . vitamin C (ASCORBIC ACID) 500 MG tablet Take 500 mg by mouth daily.    Allergies  Allergen Reactions  . Methotrexate Derivatives Nausea And Vomiting and Other (See Comments)    Hallucinations and aggressive behavior    . Tramadol Nausea And Vomiting    Hallucinations and aggressive behavior     Patient Active Problem List   Diagnosis Date Noted  . Testosterone deficiency in male 04/10/2018  . Pulmonary nodules 10/23/2017  . Sepsis, unspecified organism ([place]) 10/23/2017  . 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 Positive depression screening 09/21/2016  . Uncontrolled type 2 diabetes mellitus with hyperglycemia, without long-term current use of insulin ([place]) 09/21/2016  . Type 2 diabetes mellitus without complications ([place]) 92/33/0076  . Status post shoulder surgery 08/09/2016  . Prosthetic shoulder infection, subsequent encounter 08/09/2016  . Infection and inflammatory reaction due to internal orthopedic device, implant, and graft ([place]) 03/31/2016  . S/P shoulder replacement, left 01/30/2016  . HCAP (healthcare-associated pneumonia) 07/13/2015  . Depression   . HTN (hypertension)   . Asthma exacerbation   . MDD (major depressive disorder) ([place]) 04/30/2011  . GERD 11/23/2010  . DERMATITIS, ATOPIC 11/23/2010  . TOBACCO ABUSE 03/10/2007  . OBSTRUCTIVE SLEEP APNEA 03/10/2007  . BRONCHITIS, CHRONIC 03/10/2007  . ASTHMA, MODERATE 03/10/2007    Family History  Problem Relation Age of Onset  . Diabetes Mother   . Colon cancer Father   . Prostate cancer Father    . Diabetes Father   . Esophageal cancer Neg Hx   . Rectal cancer Neg Hx   . Stomach cancer Neg Hx     Social History   Tobacco Use  Smoking Status Current Every Day Smoker  . Packs/day: 1.50  . Years: 43.00  . Pack years: 64.50  . Types: Cigarettes  Smokeless Tobacco Never Used  Tobacco Comment   pt no longer taking chantix    Past Surgical History:  Procedure Laterality Date  . carpel tunnel Bilateral   . COLONOSCOPY W/ POLYPECTOMY    . COLONOSCOPY W/ POLYPECTOMY    . EXCISIONAL TOTAL SHOULDER ARTHROPLASTY WITH ANTIBIOTIC SPACER Left 08/09/2016   Procedure: LEFT SHOULDER HARDWARE REMOVAL, IRRIGATION AND DEBRIDEMENT, PLACEMENT OF ANTIBIOTIC SPACER;  Surgeon: Netta Cedars, MD;  Location: [place];  Service: Orthopedics;  Laterality: Left;  . IRRIGATION AND DEBRIDEMENT SHOULDER Left 03/31/2016  Procedure: IRRIGATION AND DEBRIDEMENT SHOULDER, deep cultures and polyethylene exchange;  Surgeon: Netta Cedars, MD;  Location: [place];  Service: Orthopedics;  Laterality: Left;  requests 91mins  . REVERSE SHOULDER ARTHROPLASTY Left 01/30/2016   Procedure: LEFT REVERSE SHOULDER ARTHROPLASTY;  Surgeon: Netta Cedars, MD;  Location: [place];  Service: Orthopedics;  Laterality: Left;  . ROTATOR CUFF REPAIR Left   . SHOULDER ARTHROSCOPY WITH DISTAL CLAVICLE RESECTION Right 01/30/2018   Procedure: RIGHT SHOULDER ARTHROSCOPY WITH OPEN DISTAL CLAVICLE RESECTION, SUBACROMIAL DECOMPRESSION;  Surgeon: Netta Cedars, MD;  Location: [place];  Service: Orthopedics;  Laterality: Right;  . STERIOD INJECTION Right 01/30/2018   Procedure: STEROID INJECTION;  Surgeon: Netta Cedars, MD;  Location: [place];  Service: Orthopedics;  Laterality: Right;  . TOTAL SHOULDER REVISION Left 01/17/2017   Procedure: LEFT SHOULDER REVISION REVERSE TOTAL SHOULDER ARTHROPLASTY;  Surgeon: Netta Cedars, MD;  Location: [place];  Service: Orthopedics;  Laterality: Left;  . WRIST SURGERY Right 11/2014   Dr. Amedeo Plenty    Immunization  History  Administered Date(s) Administered  . Fluad Quad(high Dose 65+) 11/14/2018  . Influenza, High Dose Seasonal PF 12/15/2016, 01/19/2018  . Influenza, Seasonal, Injecte, Preservative Fre 11/27/2015  . Influenza-Unspecified 02/14/2012, 12/27/2012, 12/19/2019  . PFIZER(Purple Top)SARS-COV-2 Vaccination 04/13/2019, 05/04/2019, 01/02/2020  . Pneumococcal Conjugate-13 03/18/2015  . Pneumococcal Polysaccharide-23 01/19/2008, 12/15/2016  . Tdap 05/31/2011    Recent Results (from the past 2160 hour(s))  POCT HgB A1C     Status: Abnormal   Collection Time: 07/23/20  9:02 AM  Result Value Ref Range   Hemoglobin A1C 7.9 (A) 4.0 - 5.6 %   HbA1c POC (<> result, manual entry)     HbA1c, POC (prediabetic range)     HbA1c, POC (controlled diabetic range)      No results found.     All questions at time of visit were answered - patient instructed to contact office with any additional concerns or updates. ER/RTC precautions were reviewed with the patient as applicable.   Please note: manual typing as well as voice recognition software may have been used to produce this document - typos may escape review. Please contact Dr. Sheppard Coil for any needed clarifications.   Total encounter time on date of service, 07/24/20, was 20 minutes spent addressing problems/issues as noted above in [place], including time spent in discussion with patient regarding the HPI, ROS, confirming history, reviewing Assessment & Plan, as well as time spent on coordination of care, record review.

## 2020-08-02 ENCOUNTER — Other Ambulatory Visit: Payer: Self-pay | Admitting: Osteopathic Medicine

## 2020-08-02 DIAGNOSIS — E291 Testicular hypofunction: Secondary | ICD-10-CM

## 2020-08-04 ENCOUNTER — Ambulatory Visit (INDEPENDENT_AMBULATORY_CARE_PROVIDER_SITE_OTHER): Payer: Medicare Other | Admitting: Medical-Surgical

## 2020-08-04 DIAGNOSIS — Z0000 Encounter for general adult medical examination without abnormal findings: Secondary | ICD-10-CM | POA: Diagnosis not present

## 2020-08-04 NOTE — Patient Instructions (Addendum)
[place] Maintenance Summary and Written Plan of Care  Frederick Guzman ,  Thank you for allowing me to perform your Medicare Annual Wellness Visit and for your ongoing commitment to your health.   Health Maintenance & Immunization History Health Maintenance  Topic Date Due  . FOOT EXAM  09/18/2020 (Originally 07/29/1960)  . Hepatitis C Screening  08/04/2021 (Originally 07/29/1968)  . INFLUENZA VACCINE  10/20/2020  . HEMOGLOBIN A1C  01/23/2021  . TETANUS/TDAP  05/30/2021  . OPHTHALMOLOGY EXAM  06/18/2021  . COLONOSCOPY (Pts 45-48yrs Insurance coverage will need to be confirmed)  05/11/2025  . COVID-19 Vaccine  Completed  . PNA vac Low Risk Adult  Completed  . HPV VACCINES  Aged Out   Immunization History  Administered Date(s) Administered  . Fluad Quad(high Dose 65+) 11/14/2018  . Influenza, High Dose Seasonal PF 12/15/2016, 01/19/2018  . Influenza, Seasonal, Injecte, Preservative Fre 11/27/2015  . Influenza-Unspecified 02/14/2012, 12/27/2012, 12/19/2019  . PFIZER(Purple Top)SARS-COV-2 Vaccination 04/13/2019, 05/04/2019, 01/02/2020  . Pneumococcal Conjugate-13 03/18/2015  . Pneumococcal Polysaccharide-23 01/19/2008, 12/15/2016  . Tdap 05/31/2011  . Zoster Recombinat (Shingrix) 02/19/2019, 03/20/2019    These are the patient goals that we discussed: Goals Addressed              This Visit's Progress   .  Patient Stated (pt-stated)        08/04/2020 AWV Goal: Diabetes Management  . Patient will maintain an A1C level below 7.5 . Patient will not develop any diabetic foot complications . Patient will not experience any hypoglycemic episodes over the next 3 months . Patient will notify our office of any CBG readings outside of the provider recommended range by calling [phone] . Patient will adhere to provider recommendations for diabetes management  Patient Self Management Activities . take all medications as prescribed and report any  negative side effects . monitor and record blood sugar readings as directed . adhere to a low carbohydrate diet that incorporates lean proteins, vegetables, whole grains, low glycemic fruits . check feet daily noting any sores, cracks, injuries, or callous formations . see PCP or podiatrist if he notices any changes in his legs, feet, or toenails . Patient will visit PCP and have an A1C level checked every 3 to 6 months as directed  . have a yearly eye exam to monitor for vascular changes associated with diabetes and will request that the report be sent to his pcp.  . consult with his PCP regarding any changes in his health or new or worsening symptoms         This is a list of Health Maintenance Items that are overdue or due now: Smoking cessation counseling  Foot exam Hep C screening There are no preventive care reminders to display for this patient.   Orders/Referrals Placed Today: No orders of the defined types were placed in this encounter.  (Contact our referral department at [phone] if you have not spoken with someone about your referral appointment within the next 5 days)    Follow-up Plan . Follow-up with [name], DO as planned . Foot exam at the next in office visit. Marland Kitchen Hep C screening with the next lab order. . Medicare wellness visit in one year.       Health Maintenance, Male Adopting a healthy lifestyle and getting preventive care are important in promoting health and wellness. Ask your health care provider about:  The right schedule for you to have regular tests and exams.  Things you  can do on your own to prevent diseases and keep yourself healthy. What should I know about diet, weight, and exercise? Eat a healthy diet  Eat a diet that includes plenty of vegetables, fruits, low-fat dairy products, and lean protein.  Do not eat a lot of foods that are high in solid fats, added sugars, or sodium.   Maintain a healthy weight Body mass index  (BMI) is a measurement that can be used to identify possible weight problems. It estimates body fat based on height and weight. Your health care provider can help determine your BMI and help you achieve or maintain a healthy weight. Get regular exercise Get regular exercise. This is one of the most important things you can do for your health. Most adults should:  Exercise for at least 150 minutes each week. The exercise should increase your heart rate and make you sweat (moderate-intensity exercise).  Do strengthening exercises at least twice a week. This is in addition to the moderate-intensity exercise.  Spend less time sitting. Even light physical activity can be beneficial. Watch cholesterol and blood lipids Have your blood tested for lipids and cholesterol at 71 years of age, then have this test every 5 years. You may need to have your cholesterol levels checked more often if:  Your lipid or cholesterol levels are high.  You are older than 71 years of age.  You are at high risk for heart disease. What should I know about cancer screening? Many types of cancers can be detected early and may often be prevented. Depending on your health history and family history, you may need to have cancer screening at various ages. This may include screening for:  Colorectal cancer.  Prostate cancer.  Skin cancer.  Lung cancer. What should I know about heart disease, diabetes, and high blood pressure? Blood pressure and heart disease  High blood pressure causes heart disease and increases the risk of stroke. This is more likely to develop in people who have high blood pressure readings, are of African descent, or are overweight.  Talk with your health care provider about your target blood pressure readings.  Have your blood pressure checked: ? Every 3-5 years if you are 10-69 years of age. ? Every year if you are 36 years old or older.  If you are between the ages of 23 and 30 and are a  current or former smoker, ask your health care provider if you should have a one-time screening for abdominal aortic aneurysm (AAA). Diabetes Have regular diabetes screenings. This checks your fasting blood sugar level. Have the screening done:  Once every three years after age 65 if you are at a normal weight and have a low risk for diabetes.  More often and at a younger age if you are overweight or have a high risk for diabetes. What should I know about preventing infection? Hepatitis B If you have a higher risk for hepatitis B, you should be screened for this virus. Talk with your health care provider to find out if you are at risk for hepatitis B infection. Hepatitis C Blood testing is recommended for:  Everyone born from [birth year] through 1966.  Anyone with known risk factors for hepatitis C. Sexually transmitted infections (STIs)  You should be screened each year for STIs, including gonorrhea and chlamydia, if: ? You are sexually active and are younger than 71 years of age. ? You are older than 71 years of age and your health care provider tells you that  you are at risk for this type of infection. ? Your sexual activity has changed since you were last screened, and you are at increased risk for chlamydia or gonorrhea. Ask your health care provider if you are at risk.  Ask your health care provider about whether you are at high risk for HIV. Your health care provider may recommend a prescription medicine to help prevent HIV infection. If you choose to take medicine to prevent HIV, you should first get tested for HIV. You should then be tested every 3 months for as long as you are taking the medicine. Follow these instructions at home: Lifestyle  Do not use any products that contain nicotine or tobacco, such as cigarettes, e-cigarettes, and chewing tobacco. If you need help quitting, ask your health care provider.  Do not use street drugs.  Do not share needles.  Ask your health care  provider for help if you need support or information about quitting drugs. Alcohol use  Do not drink alcohol if your health care provider tells you not to drink.  If you drink alcohol: ? Limit how much you have to 0-2 drinks a day. ? Be aware of how much alcohol is in your drink. In the [place] one drink equals one 12 oz bottle of beer (355 mL), one 5 oz glass of wine (148 mL), or one 1 oz glass of hard liquor (44 mL). General instructions  Schedule regular health, dental, and eye exams.  Stay current with your vaccines.  Tell your health care provider if: ? You often feel depressed. ? You have ever been abused or do not feel safe at home. Summary  Adopting a healthy lifestyle and getting preventive care are important in promoting health and wellness.  Follow your health care provider's instructions about healthy diet, exercising, and getting tested or screened for diseases.  Follow your health care provider's instructions on monitoring your cholesterol and blood pressure. This information is not intended to replace advice given to you by your health care provider. Make sure you discuss any questions you have with your health care provider. Document Revised: 03/01/2018 Document Reviewed: 03/01/2018 Elsevier Patient Education  2021 [place].   Steps to Quit Smoking Smoking tobacco is the leading cause of preventable death. It can affect almost every organ in the body. Smoking puts you and people around you at risk for many serious, long-lasting (chronic) diseases. Quitting smoking can be hard, but it is one of the best things that you can do for your health. It is never too late to quit. How do I get ready to quit? When you decide to quit smoking, make a plan to help you succeed. Before you quit:  Pick a date to quit. Set a date within the next 2 weeks to give you time to prepare.  Write down the reasons why you are quitting. Keep this list in places where you will see it  often.  Tell your family, friends, and co-workers that you are quitting. Their support is important.  Talk with your doctor about the choices that may help you quit.  Find out if your health insurance will pay for these treatments.  Know the people, places, things, and activities that make you want to smoke (triggers). Avoid them. What first steps can I take to quit smoking?  Throw away all cigarettes at home, at work, and in your car.  Throw away the things that you use when you smoke, such as ashtrays and lighters.  Clean  your car. Make sure to empty the ashtray.  Clean your home, including curtains and carpets. What can I do to help me quit smoking? Talk with your doctor about taking medicines and seeing a counselor at the same time. You are more likely to succeed when you do both.  If you are pregnant or breastfeeding, talk with your doctor about counseling or other ways to quit smoking. Do not take medicine to help you quit smoking unless your doctor tells you to do so. To quit smoking: Quit right away  Quit smoking totally, instead of slowly cutting back on how much you smoke over a period of time.  Go to counseling. You are more likely to quit if you go to counseling sessions regularly. Take medicine You may take medicines to help you quit. Some medicines need a prescription, and some you can buy over-the-counter. Some medicines may contain a drug called nicotine to replace the nicotine in cigarettes. Medicines may:  Help you to stop having the desire to smoke (cravings).  Help to stop the problems that come when you stop smoking (withdrawal symptoms). Your doctor may ask you to use:  Nicotine patches, gum, or lozenges.  Nicotine inhalers or sprays.  Non-nicotine medicine that is taken by mouth. Find resources Find resources and other ways to help you quit smoking and remain smoke-free after you quit. These resources are most helpful when you use them often. They  include:  Online chats with a Social worker.  Phone quitlines.  Printed Furniture conservator/restorer.  Support groups or group counseling.  Text messaging programs.  Mobile phone apps. Use apps on your mobile phone or tablet that can help you stick to your quit plan. There are many free apps for mobile phones and tablets as well as websites. Examples include Quit Guide from the [employer] and smokefree.gov   What things can I do to make it easier to quit?  Talk to your family and friends. Ask them to support and encourage you.  Call a phone quitline (1-800-QUIT-NOW), reach out to support groups, or work with a Social worker.  Ask people who smoke to not smoke around you.  Avoid places that make you want to smoke, such as: ? Bars. ? Parties. ? Smoke-break areas at work.  Spend time with people who do not smoke.  Lower the stress in your life. Stress can make you want to smoke. Try these things to help your stress: ? Getting regular exercise. ? Doing deep-breathing exercises. ? Doing yoga. ? Meditating. ? Doing a body scan. To do this, close your eyes, focus on one area of your body at a time from head to toe. Notice which parts of your body are tense. Try to relax the muscles in those areas.   How will I feel when I quit smoking? Day 1 to 3 weeks Within the first 24 hours, you may start to have some problems that come from quitting tobacco. These problems are very bad 2-3 days after you quit, but they do not often last for more than 2-3 weeks. You may get these symptoms:  Mood swings.  Feeling restless, nervous, angry, or annoyed.  Trouble concentrating.  Dizziness.  Strong desire for high-sugar foods and nicotine.  Weight gain.  Trouble pooping (constipation).  Feeling like you may vomit (nausea).  Coughing or a sore throat.  Changes in how the medicines that you take for other issues work in your body.  Depression.  Trouble sleeping (insomnia). Week 3 and afterward After the  first 2-3 weeks of quitting, you may start to notice more positive results, such as:  Better sense of smell and taste.  Less coughing and sore throat.  Slower heart rate.  Lower blood pressure.  Clearer skin.  Better breathing.  Fewer sick days. Quitting smoking can be hard. Do not give up if you fail the first time. Some people need to try a few times before they succeed. Do your best to stick to your quit plan, and talk with your doctor if you have any questions or concerns. Summary  Smoking tobacco is the leading cause of preventable death. Quitting smoking can be hard, but it is one of the best things that you can do for your health.  When you decide to quit smoking, make a plan to help you succeed.  Quit smoking right away, not slowly over a period of time.  When you start quitting, seek help from your doctor, family, or friends. This information is not intended to replace advice given to you by your health care provider. Make sure you discuss any questions you have with your health care provider. Document Revised: 12/01/2018 Document Reviewed: 05/27/2018 Elsevier Patient Education  [place].

## 2020-08-04 NOTE — Progress Notes (Signed)
MEDICARE ANNUAL WELLNESS VISIT  08/04/2020  Telephone Visit Disclaimer This Medicare AWV was conducted by telephone due to national recommendations for restrictions regarding the COVID-19 Pandemic (e.g. social distancing).  I verified, using two identifiers, that I am speaking with Frederick Guzman or their authorized healthcare agent. I discussed the limitations, risks, security, and privacy concerns of performing an evaluation and management service by telephone and the potential availability of an in-person appointment in the future. The patient expressed understanding and agreed to proceed.  Location of Patient: In the car. Location of Provider (nurse):  In the office.  Subjective:    Frederick Guzman is a 70 y.o. male patient of Sunnie Nielsen, DO who had a Medicare Annual Wellness Visit today via telephone. Frederick Guzman is Retired and lives with an adult companion. he has 2 children. he reports that he is socially active and does interact with friends/family regularly. he is moderately physically active and enjoys gardening.  Patient Care Team: Sunnie Nielsen, DO as PCP - General (Osteopathic Medicine) Early, Sung Amabile, NP as Nurse Practitioner (Nurse Practitioner)  Advanced Directives 08/04/2020 09/13/2019 06/26/2018 01/30/2018 10/23/2017 10/22/2017 01/12/2017  Does Patient Have a Medical Advance Directive? Yes No No No No No No  Type of Estate agent of [place];Living will - - - - - -  Does patient want to make changes to medical advance directive? No - Patient declined - - - - - -  Copy of Healthcare Power of Attorney in Chart? No - copy requested - - - - - -  Would patient like information on creating a medical advance directive? - - No - Patient declined No - Patient declined No - Patient declined - No - Patient declined    Hospital Utilization Over the Past 12 Months: # of hospitalizations or ER visits: 0 # of surgeries: 0  Review of Systems     Patient reports that his overall health is unchanged compared to last year.  History obtained from chart review and the patient  Patient Reported Readings (BP, Pulse, CBG, Weight, etc) none  Pain Assessment Pain : No/denies pain     Current Medications & Allergies (verified) Allergies as of 08/04/2020      Reactions   Methotrexate Derivatives Nausea And Vomiting, Other (See Comments)   Hallucinations and aggressive behavior    Tramadol Nausea And Vomiting   Hallucinations and aggressive behavior       Medication List       Accurate as of Aug 04, 2020  2:01 PM. If you have any questions, ask your nurse or doctor.        albuterol 108 (90 Base) MCG/ACT inhaler Commonly known as: VENTOLIN HFA Inhale 2 puffs into the lungs every 6 (six) hours as needed for wheezing or shortness of breath.   albuterol (2.5 MG/3ML) 0.083% nebulizer solution Commonly known as: PROVENTIL Take 2.5 mg by nebulization every 6 (six) hours as needed for wheezing or shortness of breath.   atorvastatin 20 MG tablet Commonly known as: LIPITOR Take 1 tablet (20 mg total) by mouth every other day.   b complex vitamins tablet Take 1 tablet by mouth daily.   Breztri Aerosphere 160-9-4.8 MCG/ACT Aero Generic drug: Budeson-Glycopyrrol-Formoterol Inhale into the lungs.   cetirizine 10 MG tablet Commonly known as: ZYRTEC Take 10 mg by mouth daily as needed for allergies.   DULoxetine 30 MG capsule Commonly known as: Cymbalta Take 1 capsule (30 mg total) by mouth daily.   esomeprazole  40 MG capsule Commonly known as: NEXIUM Take 1 capsule (40 mg total) by mouth daily.   Fish Oil 1000 MG Caps Take 1,000 mg by mouth at bedtime.   fluticasone 50 MCG/ACT nasal spray Commonly known as: FLONASE Place 1 spray into both nostrils daily as needed for allergies.   furosemide 20 MG tablet Commonly known as: LASIX Take 1 tablet (20 mg total) by mouth 2 (two) times daily as needed for edema.    glimepiride 1 MG tablet Commonly known as: AMARYL Take 1 tablet (1 mg total) by mouth daily with breakfast.   glucose blood test strip Use up to 4 times per day as directed with glucometer. Disp: 100. Refill x99   lisinopril 2.5 MG tablet Commonly known as: ZESTRIL Take 1 tablet (2.5 mg total) by mouth at bedtime.   multivitamin with minerals Tabs tablet Take 1 tablet by mouth at bedtime.   REFRESH OP Place 1 drop into both eyes daily as needed (dry eyes).   tacrolimus 0.1 % ointment Commonly known as: PROTOPIC Apply 1 application topically daily.   tadalafil 5 MG tablet Commonly known as: CIALIS Take 5 mg by mouth daily. For BPH - Rx from urology   tadalafil 20 MG tablet Commonly known as: CIALIS Take 1 tablet (20 mg total) by mouth daily as needed for erectile dysfunction.   Testosterone 30 MG/ACT Soln APPLY 1-2 PUMPS DAILY. ALTERNATE TAKING ONE PUMP UNDER ONE ARM ONE DAY, TWO PUMPS THE NEXT DAY (ONE PUMP UNDER EACH ARM)   vitamin C 500 MG tablet Commonly known as: ASCORBIC ACID Take 500 mg by mouth daily.       History (reviewed): Past Medical History:  Diagnosis Date  . Allergy   . Arthritis    OA  . Asthma   . Cancer ([place]) 08/2014   melanoma on R shoulder & back - [facility]   . COPD (chronic obstructive pulmonary disease) ([place])   . Depression    pt. denies- 01/2016, pt on Wellbutrin for smoking cessation  . Diabetes mellitus without complication ([place])   . Diverticulitis   . Dyspnea    with exertion  . Fibromyalgia   . GERD (gastroesophageal reflux disease)   . Hyperlipemia   . Hypertension    "never was high"  . Lymphocytic colitis 04/2018  . Pneumonia 06/2015, 10/2016  . Sleep apnea    last study - Nov. 2017, waiting for a new machine, using the old CPAP q night after cleaning the device after he was documented to have pneumonia from his CPAP machine.    Past Surgical History:  Procedure Laterality Date  . carpel tunnel Bilateral   .  COLONOSCOPY W/ POLYPECTOMY    . COLONOSCOPY W/ POLYPECTOMY    . EXCISIONAL TOTAL SHOULDER ARTHROPLASTY WITH ANTIBIOTIC SPACER Left 08/09/2016   Procedure: LEFT SHOULDER HARDWARE REMOVAL, IRRIGATION AND DEBRIDEMENT, PLACEMENT OF ANTIBIOTIC SPACER;  Surgeon: Netta Cedars, MD;  Location: [facility];  Service: Orthopedics;  Laterality: Left;  . IRRIGATION AND DEBRIDEMENT SHOULDER Left 03/31/2016   Procedure: IRRIGATION AND DEBRIDEMENT SHOULDER, deep cultures and polyethylene exchange;  Surgeon: Netta Cedars, MD;  Location: [facility];  Service: Orthopedics;  Laterality: Left;  requests 5mins  . REVERSE SHOULDER ARTHROPLASTY Left 01/30/2016   Procedure: LEFT REVERSE SHOULDER ARTHROPLASTY;  Surgeon: Netta Cedars, MD;  Location: [facility];  Service: Orthopedics;  Laterality: Left;  . ROTATOR CUFF REPAIR Left   . SHOULDER ARTHROSCOPY WITH DISTAL CLAVICLE RESECTION Right 01/30/2018   Procedure: RIGHT SHOULDER  ARTHROSCOPY WITH OPEN DISTAL CLAVICLE RESECTION, SUBACROMIAL DECOMPRESSION;  Surgeon: Netta Cedars, MD;  Location: [facility];  Service: Orthopedics;  Laterality: Right;  . STERIOD INJECTION Right 01/30/2018   Procedure: STEROID INJECTION;  Surgeon: Netta Cedars, MD;  Location: [facility];  Service: Orthopedics;  Laterality: Right;  . TOTAL SHOULDER REVISION Left 01/17/2017   Procedure: LEFT SHOULDER REVISION REVERSE TOTAL SHOULDER ARTHROPLASTY;  Surgeon: Netta Cedars, MD;  Location: [facility];  Service: Orthopedics;  Laterality: Left;  . WRIST SURGERY Right 11/2014   Dr. Amedeo Plenty   Family History  Problem Relation Age of Onset  . Diabetes Mother   . Colon cancer Father   . Prostate cancer Father   . Diabetes Father   . Esophageal cancer Neg Hx   . Rectal cancer Neg Hx   . Stomach cancer Neg Hx    Social History   Socioeconomic History  . Marital status: Soil scientist    Spouse name: Webb Silversmith  . Number of children: 2  . Years of education: 55  . Highest education level: 12th grade  Occupational History  .  Occupation: lineman    Comment: retired  Tobacco Use  . Smoking status: Current Every Day Smoker    Packs/day: 0.75    Years: 43.00    Pack years: 32.25    Types: Cigarettes  . Smokeless tobacco: Never Used  . Tobacco comment: pt no longer taking chantix  Vaping Use  . Vaping Use: Never used  Substance and Sexual Activity  . Alcohol use: Yes    Alcohol/week: 9.0 standard drinks    Types: 6 Cans of beer, 3 Shots of liquor per week    Comment: beer or mixed drink   . Drug use: No  . Sexual activity: Yes  Other Topics Concern  . Not on file  Social History Narrative   Lives with his girlfriend, Webb Silversmith. Retired. DOes a lot of yard work and handy work. Members of the camping club.   Social Determinants of Health   Financial Resource Strain: Low Risk   . Difficulty of Paying Living Expenses: Not hard at all  Food Insecurity: No Food Insecurity  . Worried About Charity fundraiser in the Last Year: Never true  . Ran Out of Food in the Last Year: Never true  Transportation Needs: No Transportation Needs  . Lack of Transportation (Medical): No  . Lack of Transportation (Non-Medical): No  Physical Activity: Sufficiently Active  . Days of Exercise per Week: 4 days  . Minutes of Exercise per Session: 60 min  Stress: No Stress Concern Present  . Feeling of Stress : Not at all  Social Connections: Socially Integrated  . Frequency of Communication with Friends and Family: More than three times a week  . Frequency of Social Gatherings with Friends and Family: Three times a week  . Attends Religious Services: More than 4 times per year  . Active Member of Clubs or Organizations: Yes  . Attends Archivist Meetings: 1 to 4 times per year  . Marital Status: Living with partner    Activities of Daily Living In your present state of health, do you have any difficulty performing the following activities: 08/04/2020  Hearing? N  Vision? N  Difficulty concentrating or making  decisions? N  Walking or climbing stairs? N  Dressing or bathing? N  Doing errands, shopping? N  Preparing Food and eating ? N  Using the Toilet? N  In the past six months, have you accidently  leaked urine? N  Do you have problems with loss of bowel control? N  Managing your Medications? N  Managing your Finances? N  Housekeeping or managing your Housekeeping? N  Some recent data might be hidden    Patient Education/ Literacy How often do you need to have someone help you when you read instructions, pamphlets, or other written materials from your doctor or pharmacy?: 1 - Never What is the last grade level you completed in school?: 12th grade  Exercise Current Exercise Habits: Home exercise routine, Type of exercise: Other - see comments (bike), Time (Minutes): 60, Frequency (Times/Week): 4, Weekly Exercise (Minutes/Week): 240, Intensity: Moderate, Exercise limited by: None identified  Diet Patient reports consuming 1-2 meals a day and 0 snack(s) a day Patient reports that his primary diet is: Regular Patient reports that she does have regular access to food.   Depression Screen PHQ 2/9 Scores 08/04/2020 07/23/2020 09/18/2019 06/26/2018 10/17/2017 01/03/2017 09/21/2016  PHQ - 2 Score 2 2 2  0 1 1 5   PHQ- 9 Score 3 - 5 - 4 - 21     Fall Risk Fall Risk  08/04/2020 07/23/2020 06/26/2018 01/03/2017 09/20/2016  Falls in the past year? 0 0 0 - No  Number falls in past yr: 0 0 - 1 -  Injury with Fall? 0 0 - Yes -  Risk for fall due to : No Fall Risks No Fall Risks - History of fall(s) -  Follow up Falls evaluation completed;Education provided Falls evaluation completed Falls prevention discussed - -     Objective:  Frederick Guzman seemed alert and oriented and he participated appropriately during our telephone visit.  Blood Pressure Weight BMI  BP Readings from Last 3 Encounters:  07/23/20 140/65  05/28/20 (!) 146/79  04/16/20 137/78   Wt Readings from Last 3 Encounters:  07/23/20 202 lb  1.9 oz (91.7 kg)  05/28/20 203 lb 0.6 oz (92.1 kg)  04/16/20 201 lb 4.8 oz (91.3 kg)   BMI Readings from Last 1 Encounters:  07/23/20 26.67 kg/m    *Unable to obtain current vital signs, weight, and BMI due to telephone visit type  Hearing/Vision  . Frederick Guzman did not seem to have difficulty with hearing/understanding during the telephone conversation . Reports that he has had a formal eye exam by an eye care professional within the past year . Reports that he has not had a formal hearing evaluation within the past year *Unable to fully assess hearing and vision during telephone visit type  Cognitive Function: 6CIT Screen 08/04/2020 06/26/2018  What Year? 0 points 0 points  What month? 0 points 0 points  What time? 0 points 0 points  Count back from 20 0 points 0 points  Months in reverse 0 points 0 points  Repeat phrase 0 points 0 points  Total Score 0 0   (Normal:0-7, Significant for Dysfunction: >8)  Normal Cognitive Function Screening: Yes   Immunization & Health Maintenance Record Immunization History  Administered Date(s) Administered  . Fluad Quad(high Dose 65+) 11/14/2018  . Influenza, High Dose Seasonal PF 12/15/2016, 01/19/2018  . Influenza, Seasonal, Injecte, Preservative Fre 11/27/2015  . Influenza-Unspecified 02/14/2012, 12/27/2012, 12/19/2019  . PFIZER(Purple Top)SARS-COV-2 Vaccination 04/13/2019, 05/04/2019, 01/02/2020  . Pneumococcal Conjugate-13 03/18/2015  . Pneumococcal Polysaccharide-23 01/19/2008, 12/15/2016  . Tdap 05/31/2011  . Zoster Recombinat (Shingrix) 02/19/2019, 03/20/2019    Health Maintenance  Topic Date Due  . FOOT EXAM  09/18/2020 (Originally 07/29/1960)  . Hepatitis C Screening  08/04/2021 (Originally 07/29/1968)  .  INFLUENZA VACCINE  10/20/2020  . HEMOGLOBIN A1C  01/23/2021  . TETANUS/TDAP  05/30/2021  . OPHTHALMOLOGY EXAM  06/18/2021  . COLONOSCOPY (Pts 45-25yrs Insurance coverage will need to be confirmed)  05/11/2025  . COVID-19 Vaccine   Completed  . PNA vac Low Risk Adult  Completed  . HPV VACCINES  Aged Out       Assessment  This is a routine wellness examination for Frederick Guzman.  Health Maintenance: Due or Overdue There are no preventive care reminders to display for this patient.  Frederick Guzman does not need a referral for Community Assistance: Care Management:   no Social Work:    no Prescription Assistance:  no Nutrition/Diabetes Education:  no   Plan:  Personalized Goals Goals Addressed              This Visit's Progress   .  Patient Stated (pt-stated)        08/04/2020 AWV Goal: Diabetes Management  . Patient will maintain an A1C level below 7.5 . Patient will not develop any diabetic foot complications . Patient will not experience any hypoglycemic episodes over the next 3 months . Patient will notify our office of any CBG readings outside of the provider recommended range by calling [phone] . Patient will adhere to provider recommendations for diabetes management  Patient Self Management Activities . take all medications as prescribed and report any negative side effects . monitor and record blood sugar readings as directed . adhere to a low carbohydrate diet that incorporates lean proteins, vegetables, whole grains, low glycemic fruits . check feet daily noting any sores, cracks, injuries, or callous formations . see PCP or podiatrist if he notices any changes in his legs, feet, or toenails . Patient will visit PCP and have an A1C level checked every 3 to 6 months as directed  . have a yearly eye exam to monitor for vascular changes associated with diabetes and will request that the report be sent to his pcp.  . consult with his PCP regarding any changes in his health or new or worsening symptoms       Personalized Health Maintenance & Screening Recommendations  Smoking cessation counseling  Foot exam Hep C screening  Lung Cancer Screening Recommended: yes (Low Dose  CT Chest recommended if Age 56-80 years, 30 pack-year currently smoking OR have quit w/in past 15 years) Hepatitis C Screening recommended: yes HIV Screening recommended: no  Advanced Directives: Written information was not prepared per patient's request.  Referrals & Orders No orders of the defined types were placed in this encounter.   Follow-up Plan . Follow-up with [name], DO as planned . Foot exam at the next in office visit. Marland Kitchen Hep C screening with the next lab order. . Medicare wellness visit in one year.   I have personally reviewed and noted the following in the patient's chart:   . Medical and social history . Use of alcohol, tobacco or illicit drugs  . Current medications and supplements . Functional ability and status . Nutritional status . Physical activity . Advanced directives . List of other physicians . Hospitalizations, surgeries, and ER visits in previous 12 months . Vitals . Screenings to include cognitive, depression, and falls . Referrals and appointments  In addition, I have reviewed and discussed with Frederick Guzman certain preventive protocols, quality metrics, and best practice recommendations. A written personalized care plan for preventive services as well as general preventive health recommendations is available and can be  mailed to the patient at his request.      Tinnie Gens, RN  08/04/2020

## 2020-08-07 DIAGNOSIS — E785 Hyperlipidemia, unspecified: Secondary | ICD-10-CM

## 2020-08-07 DIAGNOSIS — E250 Congenital adrenogenital disorders associated with enzyme deficiency: Secondary | ICD-10-CM | POA: Insufficient documentation

## 2020-08-07 HISTORY — DX: Hyperlipidemia, unspecified: E78.5

## 2020-08-08 ENCOUNTER — Telehealth: Payer: Self-pay

## 2020-08-08 NOTE — Telephone Encounter (Signed)
Prior authorization for testosterone gel approved by insurance from 08/07/20 through 08/07/2021.

## 2020-08-20 ENCOUNTER — Encounter: Payer: Self-pay | Admitting: Osteopathic Medicine

## 2020-08-20 DIAGNOSIS — L820 Inflamed seborrheic keratosis: Secondary | ICD-10-CM | POA: Diagnosis not present

## 2020-08-20 DIAGNOSIS — C44622 Squamous cell carcinoma of skin of right upper limb, including shoulder: Secondary | ICD-10-CM | POA: Diagnosis not present

## 2020-08-20 DIAGNOSIS — Z85820 Personal history of malignant melanoma of skin: Secondary | ICD-10-CM | POA: Diagnosis not present

## 2020-08-20 DIAGNOSIS — Z85828 Personal history of other malignant neoplasm of skin: Secondary | ICD-10-CM | POA: Diagnosis not present

## 2020-08-20 DIAGNOSIS — L570 Actinic keratosis: Secondary | ICD-10-CM | POA: Diagnosis not present

## 2020-08-20 DIAGNOSIS — L2089 Other atopic dermatitis: Secondary | ICD-10-CM | POA: Diagnosis not present

## 2020-08-20 DIAGNOSIS — D2372 Other benign neoplasm of skin of left lower limb, including hip: Secondary | ICD-10-CM | POA: Diagnosis not present

## 2020-08-20 DIAGNOSIS — D1801 Hemangioma of skin and subcutaneous tissue: Secondary | ICD-10-CM | POA: Diagnosis not present

## 2020-08-21 ENCOUNTER — Other Ambulatory Visit: Payer: Self-pay | Admitting: Osteopathic Medicine

## 2020-08-21 MED ORDER — DULOXETINE HCL 60 MG PO CPEP: 60.0000 mg | ORAL_CAPSULE | Freq: Every day | ORAL | 3 refills | Status: DC

## 2020-09-01 ENCOUNTER — Other Ambulatory Visit: Payer: Self-pay | Admitting: Osteopathic Medicine

## 2020-09-03 DIAGNOSIS — Z23 Encounter for immunization: Secondary | ICD-10-CM | POA: Diagnosis not present

## 2020-09-07 ENCOUNTER — Other Ambulatory Visit: Payer: Self-pay | Admitting: Osteopathic Medicine

## 2020-09-15 ENCOUNTER — Encounter: Payer: Self-pay | Admitting: Physician Assistant

## 2020-09-15 ENCOUNTER — Ambulatory Visit (INDEPENDENT_AMBULATORY_CARE_PROVIDER_SITE_OTHER): Payer: Medicare Other | Admitting: Physician Assistant

## 2020-09-15 ENCOUNTER — Other Ambulatory Visit: Payer: Self-pay

## 2020-09-15 VITALS — BP 144/75 | HR 96 | Ht 72.0 in | Wt 195.0 lb

## 2020-09-15 DIAGNOSIS — R81 Glycosuria: Secondary | ICD-10-CM

## 2020-09-15 DIAGNOSIS — E1165 Type 2 diabetes mellitus with hyperglycemia: Secondary | ICD-10-CM

## 2020-09-15 DIAGNOSIS — R7309 Other abnormal glucose: Secondary | ICD-10-CM | POA: Diagnosis not present

## 2020-09-15 LAB — POCT URINALYSIS DIP (CLINITEK)
Bilirubin, UA: NEGATIVE
Blood, UA: NEGATIVE
Glucose, UA: 500 mg/dL — AB
Ketones, POC UA: NEGATIVE mg/dL
Leukocytes, UA: NEGATIVE
Nitrite, UA: NEGATIVE
POC PROTEIN,UA: NEGATIVE
Spec Grav, UA: 1.02 (ref 1.010–1.025)
Urobilinogen, UA: 0.2 E.U./dL
pH, UA: 6 (ref 5.0–8.0)

## 2020-09-15 LAB — COMPLETE METABOLIC PANEL WITH GFR
AG Ratio: 1.5 (calc) (ref 1.0–2.5)
ALT: 23 U/L (ref 9–46)
AST: 15 U/L (ref 10–35)
Albumin: 3.9 g/dL (ref 3.6–5.1)
Alkaline phosphatase (APISO): 104 U/L (ref 35–144)
BUN: 14 mg/dL (ref 7–25)
CO2: 29 mmol/L (ref 20–32)
Calcium: 9.2 mg/dL (ref 8.6–10.3)
Chloride: 97 mmol/L — ABNORMAL LOW (ref 98–110)
Creat: 1.09 mg/dL (ref 0.70–1.18)
GFR, Est African American: 79 mL/min/{1.73_m2} (ref >=60)
GFR, Est Non African American: 68 mL/min/{1.73_m2} (ref >=60)
Globulin: 2.6 g/dL (calc) (ref 1.9–3.7)
Glucose, Bld: 328 mg/dL — ABNORMAL HIGH (ref 65–99)
Potassium: 4.8 mmol/L (ref 3.5–5.3)
Sodium: 132 mmol/L — ABNORMAL LOW (ref 135–146)
Total Bilirubin: 0.4 mg/dL (ref 0.2–1.2)
Total Protein: 6.5 g/dL (ref 6.1–8.1)

## 2020-09-15 LAB — GLUCOSE, POCT (MANUAL RESULT ENTRY): POC Glucose: 394 mg/dl — AB (ref 70–99)

## 2020-09-15 MED ORDER — INSULIN PEN NEEDLE 32G X 4 MM MISC: 1 refills | Status: DC

## 2020-09-15 MED ORDER — LANTUS SOLOSTAR 100 UNIT/ML ~~LOC~~ SOPN: 15.0000 [IU] | PEN_INJECTOR | Freq: Every day | SUBCUTANEOUS | 0 refills | Status: DC

## 2020-09-15 MED ORDER — LANTUS SOLOSTAR 100 UNIT/ML ~~LOC~~ SOPN: 15.0000 [IU] | PEN_INJECTOR | Freq: Every day | SUBCUTANEOUS | 1 refills | Status: DC

## 2020-09-15 NOTE — Patient Instructions (Addendum)
Stop your Glimepride Measure fasting blood sugar every day: goal for now is to get this to 80-130 - write down readings for Dr. Alphonzo Lemmings Lantus 15Units at bedtime Bring all sugar readings with you to your office visits   Insulin Treatment for Diabetes Mellitus Diabetes, also known as diabetes mellitus, is a long-term (chronic) disease. It occurs when the body does not properly use sugar (glucose). Glucose levels are controlled by a hormone called insulin. Insulin is made in the pancreas, which is an organ behind the stomach. In type 1 diabetes, the pancreas does not make insulin. In type 2 diabetes, the body does not use or respond to insulin properly (called insulin resistance). Also, in some people, the pancreas does not make enough insulin. Treatment plans for diabetes vary. They are unique for each person. Treatment plans depend on: The type of diabetes. The treatment goals. Your medical history. People with type 1 diabetes and some with type 2 diabetes will need to take insulin as part of their treatment plan. Ask questions to understand yourinsulin treatment so you can be active in managing your diabetes. Types of insulin Insulin molecules are delicate. Insulin is destroyed by enzymes in the stomach or intestine. For this reason, insulin is not given in pill form. It is eitherinjected under the skin or inhaled into the lungs. You may use more than one type of insulin. The different types of insulin are described below. It is important to know the onset, peak, and duration of the type of insulin you take. The onset is when it starts lowering blood glucose.The peak is when it works the strongest. The duration is how long it works. Insulin comes in different strengths. The most common strength is U-100, or 100 units per 1 mL of insulin. It is important to make sure you are using the rightstrength of insulin with the right syringe. Rapid-acting insulin: Onset: Within 15 minutes. Peak:  About 1-2 hours. Duration: 2-4 hours. Works well when taken right before a meal to quickly lower your blood glucose. This type of insulin is available as an injection and an inhaler. You and your health care provider will decide if an injection or inhaler is best for you. Short-acting insulin: Onset: Within 30 minutes. Peak: 2-3 hours. Duration: 3-6 hours. Should be taken about 30 minutes before you start eating a meal. Intermediate-acting insulin: Onset: Within 1.5-4 hours. Peak: 4-12 hours. Duration: 12-18 hours. Lowers your blood glucose for a longer period of time. However, it does not work as well for lowering blood glucose right after a meal. Usually used 1-2 times per day. Long-acting insulin: Mimics the small amount of insulin that your pancreas usually makes throughout the day. Onset: Within 2 hours. Peak: There is no peak. Long-acting insulins lower blood glucose levels evenly throughout the day. Duration: At least 24 hours. Long-acting insulins should be used one or two times a day. Concentrated insulin: Concentrated insulins are available in higher concentrations than U-100 insulins. Concentrated insulins are helpful for people who require high doses of insulin, usually more than 100 units per day. Concentrated insulins deliver the same amount of insulin but in a smaller volume. Concentrated insulins are available as: Humulin (Regular insulin U-500) has 500 units per 1 mL of insulin. This insulin should only be used only with the U-500 syringe or U-500 insulin pen. Do not use another type of syringe with this insulin. The wrong type of syringe can cause serious problems such as low blood glucose. Humalog (Insulin Lispro,  U-200) and Tresiba (Insulin degludec, U-200) have 200 units per 1 mL of insulin. These insulins are only available as a U-200 pen. Toujeo (Insulin glargine, U-300) has 300 units per 1 mL of insulin. This insulin is only available as a U-300 pen. Common terms  related to insulin treatment: Some terms that you might hear include: Basal insulin or background insulin This is the constant amount of insulin that keeps your blood glucose levels stable when you are not eating. People who have type 1 diabetes need basal insulin in a nonstop (continuous) or steady dose 24 hours a day. People with type 2 diabetes may also get basal insulin. Usually, intermediate-acting or long-acting insulin is used one or two times a day to manage glucose levels. Bolus insulin This refers to meal-related insulin (prandial insulin). Blood glucose rises quickly after a meal (postprandial). Rapid-acting or short-acting insulin can be used before a meal (preprandial) to help control blood glucose after the meal. You may be told to adjust the amount of bolus insulin that you take based on how much carbohydrate is in your meal. Correction insulin This may also be called a correction dose or supplemental dose. This is a small amount of rapid-acting or short-acting insulin that can be used to lower your blood glucose if it is too high. You may be told to check your blood glucose atcertain times of the day and use correction insulin as needed. Tight control, intensive therapy, or basal-bolus insulin therapy These terms are used for insulin plans that keep your blood glucose as close to your target as possible. They prevent your blood glucose from getting too high at any time of day, but especially after meals. People who have tight controlof their diabetes have fewer long-term problems caused by diabetes. Insulins are also available as mixtures of basal and bolus insulins. Using a premixed insulin decreases the number of injections you might need everyday.Talk to your health care provider to see if an insulin mixture is right for you. What are the risks? Possible side effects of insulin treatment include: Low blood glucose (hypoglycemia). Weight gain. Bruising or irritation at the  injection site. Some of these side effects can be caused by incorrect insulin doses andimproper injection technique. Be sure to learn how to inject insulin properly. Supplies needed: Soap and water. Insulin. Insulin syringe or insulin pen needles. Alcohol wipes. Blood glucose meter. Blood glucose test strips and lancets. A disposal container for sharp items (sharps container), such as an empty plastic bottle with a cover. How to use insulin Most often, insulin is given through an injection. It is injected using a syringe and needle, an insulin pen, a pump, or a jet injector. Your health care provider will: Prescribe the type and amount of insulin that you need. Tell you when you should inject your insulin. Usually, you will give yourself insulin injections. Other people can also be taught how to give you injections. You will use a type of syringe that is made only for insulin. Some people may have an insulin pump that delivers insulin steadily through a tube (cannula) that is placed under the skin. Insulin is also available in an inhaled form. An inhaler delivers bolus insulin doses. Inhaled insulin does not require injections, but most people will stillneed to use basal insulin that is injected. Injection sites Insulin is injected into a layer of fatty tissue under your skin. Good places to inject insulin include: Abdomen. Generally, the abdomen is the best place to inject insulin. However,  you should avoid any area that is less than 2 inches (5 cm) from your belly button. Front of thigh. Upper, outer side of thigh. Upper, outer side of arm. Upper, outer part of buttock. It is important to: Give your injection in a slightly different place each time. This helps to prevent irritation and improve absorption. Avoid injecting into areas that have scar tissue. Follow these instructions at home: Eating and drinking Talk with your health care provider or pharmacist about the type of insulin you  should take and when you should take it. You should know when your insulin peaksand how long it lasts. You need this information to plan mealtimes and exercise. Follow instructions from your health care provider about a healthy meal plan. Do not skip meals. Drink enough fluid to keep your urine pale yellow. Follow your sick day plan whenever you cannot eat or drink normally. Make this plan in advance with your health care provider. Lifestyle Work with your health care provider to manage your weight, blood pressure, cholesterol, and stress. Exercise regularly. Avoid drinking alcohol. Do not use any products that contain nicotine or tobacco. These products include cigarettes, chewing tobacco, and vaping devices, such as e-cigarettes. If you need help quitting, ask your health care provider. General instructions  Check your blood glucose as told. Your health care provider will tell you how often and when you should check your blood glucose. Your health care provider will also tell you what your glucose levels should be. Make sure to check your blood glucose before and after you exercise. If you exercise longer or in a different way than usual, check your blood glucose more often. Make sure you know the symptoms of high and low blood sugar and how to treat these. Take over-the-counter and prescription medicines only as told by your health care provider. Carry a medical alert card or wear medical alert jewelry. Keep all follow-up visits. This is important.  Summary Diabetes is a long-term disease. It occurs when the body does not properly use glucose. Glucose levels are controlled by a hormone called insulin. Insulin treatment varies depending on your type of diabetes, your treatment goals, and your medical history. Talk with your health care provider or pharmacist about the type of insulin you should take and when you should take it. Check your blood glucose as told by your health care provider.  Your health care provider will tell you how often and when you should check your blood glucose, and what your glucose levels should be. Know the symptoms of high and low blood glucose and how to treat them. This information is not intended to replace advice given to you by your health care provider. Make sure you discuss any questions you have with your healthcare provider. Document Revised: 01/09/2020 Document Reviewed: 01/09/2020 Elsevier Patient Education  2022 [employer].

## 2020-09-15 NOTE — Progress Notes (Signed)
Acute Office Visit  Subjective:    Patient ID: Frederick Guzman, male    DOB: September 25, 1950, 70 y.o.   MRN: [id number]  No chief complaint on file.   HPI Patient is in today for hyperglycemia  He is accompanied by his wife today  He has had elevated fasting glucose readings 280-320 for the last week For approx the last 3 weeks has had fatigue/malaise and extreme thirst, dry mouth, and polyuria Reports feeling so bad he can hardly function Denies lethargy, confusion, headaches, vision changes Has been taking Glimeperide 1 mg daily for approx 5 years Has never taken insulin No hx of DKA or hospitalizations related to diabetes Lab Results  Component Value Date   HGBA1C 7.9 (A) 07/23/2020    Past Medical History:  Diagnosis Date   Allergy    Arthritis    OA   Asthma    Cancer ([place]) 08/2014   melanoma on R shoulder & back - [facility]    COPD (chronic obstructive pulmonary disease) ([place])    Depression    pt. denies- 01/2016, pt on Wellbutrin for smoking cessation   Diabetes mellitus without complication ([place])    Diverticulitis    Dyspnea    with exertion   Fibromyalgia    GERD (gastroesophageal reflux disease)    Hyperlipemia    Hypertension    "never was high"   Lymphocytic colitis 04/2018   Pneumonia 06/2015, 10/2016   Sleep apnea    last study - Nov. 2017, waiting for a new machine, using the old CPAP q night after cleaning the device after he was documented to have pneumonia from his CPAP machine.     Past Surgical History:  Procedure Laterality Date   carpel tunnel Bilateral    COLONOSCOPY W/ POLYPECTOMY     COLONOSCOPY W/ POLYPECTOMY     EXCISIONAL TOTAL SHOULDER ARTHROPLASTY WITH ANTIBIOTIC SPACER Left 08/09/2016   Procedure: LEFT SHOULDER HARDWARE REMOVAL, IRRIGATION AND DEBRIDEMENT, PLACEMENT OF ANTIBIOTIC SPACER;  Surgeon: Netta Cedars, MD;  Location: [facility];  Service: Orthopedics;  Laterality: Left;   IRRIGATION AND DEBRIDEMENT SHOULDER Left 03/31/2016    Procedure: IRRIGATION AND DEBRIDEMENT SHOULDER, deep cultures and polyethylene exchange;  Surgeon: Netta Cedars, MD;  Location: [facility];  Service: Orthopedics;  Laterality: Left;  requests 27mins   REVERSE SHOULDER ARTHROPLASTY Left 01/30/2016   Procedure: LEFT REVERSE SHOULDER ARTHROPLASTY;  Surgeon: Netta Cedars, MD;  Location: [facility];  Service: Orthopedics;  Laterality: Left;   ROTATOR CUFF REPAIR Left    SHOULDER ARTHROSCOPY WITH DISTAL CLAVICLE RESECTION Right 01/30/2018   Procedure: RIGHT SHOULDER ARTHROSCOPY WITH OPEN DISTAL CLAVICLE RESECTION, SUBACROMIAL DECOMPRESSION;  Surgeon: Netta Cedars, MD;  Location: [facility];  Service: Orthopedics;  Laterality: Right;   STERIOD INJECTION Right 01/30/2018   Procedure: STEROID INJECTION;  Surgeon: Netta Cedars, MD;  Location: [facility];  Service: Orthopedics;  Laterality: Right;   TOTAL SHOULDER REVISION Left 01/17/2017   Procedure: LEFT SHOULDER REVISION REVERSE TOTAL SHOULDER ARTHROPLASTY;  Surgeon: Netta Cedars, MD;  Location: [facility];  Service: Orthopedics;  Laterality: Left;   WRIST SURGERY Right 11/2014   Dr. Amedeo Plenty    Family History  Problem Relation Age of Onset   Diabetes Mother    Colon cancer Father    Prostate cancer Father    Diabetes Father    Esophageal cancer Neg Hx    Rectal cancer Neg Hx    Stomach cancer Neg Hx     Social History   Socioeconomic History  Marital status: Soil scientist    Spouse name: Webb Silversmith   Number of children: 2   Years of education: 12   Highest education level: 12th grade  Occupational History   Occupation: lineman    Comment: retired  Tobacco Use   Smoking status: Every Day    Packs/day: 0.75    Years: 43.00    Pack years: 32.25    Types: Cigarettes   Smokeless tobacco: Never   Tobacco comments:    pt no longer taking chantix  Vaping Use   Vaping Use: Never used  Substance and Sexual Activity   Alcohol use: Yes    Alcohol/week: 9.0 standard drinks    Types: 6 Cans of beer, 3 Shots of  liquor per week    Comment: beer or mixed drink    Drug use: No   Sexual activity: Yes  Other Topics Concern   Not on file  Social History Narrative   Lives with his girlfriend, Webb Silversmith. Retired. DOes a lot of yard work and handy work. Members of the camping club.   Social Determinants of Health   Financial Resource Strain: Low Risk    Difficulty of Paying Living Expenses: Not hard at all  Food Insecurity: No Food Insecurity   Worried About Charity fundraiser in the Last Year: Never true   [place] in the Last Year: Never true  Transportation Needs: No Transportation Needs   Lack of Transportation (Medical): No   Lack of Transportation (Non-Medical): No  Physical Activity: Sufficiently Active   Days of Exercise per Week: 4 days   Minutes of Exercise per Session: 60 min  Stress: No Stress Concern Present   Feeling of Stress : Not at all  Social Connections: Socially Integrated   Frequency of Communication with Friends and Family: More than three times a week   Frequency of Social Gatherings with Friends and Family: Three times a week   Attends Religious Services: More than 4 times per year   Active Member of Clubs or Organizations: Yes   Attends Archivist Meetings: 1 to 4 times per year   Marital Status: Living with partner  Intimate Partner Violence: Not At Risk   Fear of Current or Ex-Partner: No   Emotionally Abused: No   Physically Abused: No   Sexually Abused: No    Outpatient Medications Prior to Visit  Medication Sig Dispense Refill   albuterol (PROVENTIL) (2.5 MG/3ML) 0.083% nebulizer solution Take 2.5 mg by nebulization every 6 (six) hours as needed for wheezing or shortness of breath.     albuterol (VENTOLIN HFA) 108 (90 Base) MCG/ACT inhaler Inhale 2 puffs into the lungs every 6 (six) hours as needed for wheezing or shortness of breath.     atorvastatin (LIPITOR) 20 MG tablet Take 1 tablet (20 mg total) by mouth every other day. 90 tablet 1   b  complex vitamins tablet Take 1 tablet by mouth daily.     Budeson-Glycopyrrol-Formoterol (BREZTRI AEROSPHERE) 160-9-4.8 MCG/ACT AERO Inhale into the lungs.     cetirizine (ZYRTEC) 10 MG tablet Take 10 mg by mouth daily as needed for allergies.      DULoxetine (CYMBALTA) 60 MG capsule Take 1 capsule (60 mg total) by mouth daily. 90 capsule 3   esomeprazole (NEXIUM) 40 MG capsule Take 1 capsule (40 mg total) by mouth daily. 90 capsule 1   fluticasone (FLONASE) 50 MCG/ACT nasal spray Place 1 spray into both nostrils daily as needed for allergies.  1   furosemide (LASIX) 20 MG tablet Take 1 tablet (20 mg total) by mouth 2 (two) times daily as needed for edema. 60 tablet 2   glimepiride (AMARYL) 1 MG tablet Take 1 tablet (1 mg total) by mouth daily with breakfast. 90 tablet 3   glucose blood test strip Use up to 4 times per day as directed with glucometer. Disp: 100. Refill x99 100 each 99   lisinopril (ZESTRIL) 2.5 MG tablet Take 1 tablet (2.5 mg total) by mouth at bedtime. 90 tablet 1   Multiple Vitamin (MULTIVITAMIN WITH MINERALS) TABS tablet Take 1 tablet by mouth at bedtime.     Omega-3 Fatty Acids (FISH OIL) 1000 MG CAPS Take 1,000 mg by mouth at bedtime.      Polyvinyl Alcohol-Povidone (REFRESH OP) Place 1 drop into both eyes daily as needed (dry eyes).     tacrolimus (PROTOPIC) 0.1 % ointment Apply 1 application topically daily.     tadalafil (CIALIS) 20 MG tablet Take 1 tablet (20 mg total) by mouth daily as needed for erectile dysfunction. 30 tablet 3   tadalafil (CIALIS) 5 MG tablet Take 5 mg by mouth daily. For BPH - Rx from urology     Testosterone 30 MG/ACT SOLN APPLY 1-2 PUMPS DAILY. ALTERNATE TAKING ONE PUMP UNDER ONE ARM ONE DAY, TWO PUMPS THE NEXT DAY (ONE PUMP UNDER EACH ARM) 90 mL 0   vitamin C (ASCORBIC ACID) 500 MG tablet Take 500 mg by mouth daily.     No facility-administered medications prior to visit.    Allergies  Allergen Reactions   Methotrexate Derivatives Nausea And  Vomiting and Other (See Comments)    Hallucinations and aggressive behavior     Tramadol Nausea And Vomiting    Hallucinations and aggressive behavior     Review of Systems  Constitutional:  Positive for activity change, appetite change (decreased) and fatigue. Negative for fever.  Eyes:  Negative for visual disturbance.  Endocrine: Positive for polydipsia and polyuria.  Neurological:  Positive for weakness.  All other systems reviewed and are negative.     Objective:    Physical Exam Vitals reviewed.  Constitutional:      Appearance: He is not ill-appearing or toxic-appearing.  Cardiovascular:     Rate and Rhythm: Normal rate and regular rhythm.     Heart sounds: Normal heart sounds.  Pulmonary:     Effort: Pulmonary effort is normal.     Breath sounds: Normal breath sounds.  Musculoskeletal:     Right lower leg: No edema.     Left lower leg: No edema.  Skin:    General: Skin is warm and dry.     Coloration: Skin is not jaundiced.  Neurological:     Mental Status: He is alert.     Gait: Gait normal.  Psychiatric:        Behavior: Behavior normal.        Thought Content: Thought content normal.    BP (!) 144/75   Pulse 96   Ht 6' (1.829 m)   Wt 195 lb (88.5 kg)   BMI 26.45 kg/m  Wt Readings from Last 3 Encounters:  09/15/20 195 lb (88.5 kg)  07/23/20 202 lb 1.9 oz (91.7 kg)  05/28/20 203 lb 0.6 oz (92.1 kg)    Health Maintenance Due  Topic Date Due   COVID-19 Vaccine (4 - Booster for Pfizer series) 04/03/2020     Lab Results  Component Value Date   TSH 3.21 04/10/2018  Lab Results  Component Value Date   WBC 6.3 04/16/2020   HGB 15.3 04/16/2020   HCT 44.0 04/16/2020   MCV 92.1 04/16/2020   PLT 233 04/16/2020   Lab Results  Component Value Date   NA 141 04/16/2020   K 4.5 04/16/2020   CO2 30 04/16/2020   GLUCOSE 142 (H) 04/16/2020   BUN 13 04/16/2020   CREATININE 0.97 04/16/2020   BILITOT 0.6 04/16/2020   ALKPHOS 113 10/19/2016   AST 18  04/16/2020   ALT 28 04/16/2020   PROT 6.2 04/16/2020   ALBUMIN 4.1 10/19/2016   CALCIUM 9.0 04/16/2020   ANIONGAP 10 10/23/2017   Lab Results  Component Value Date   CREATININE 0.97 04/16/2020   BUN 13 04/16/2020   NA 141 04/16/2020   K 4.5 04/16/2020   CL 106 04/16/2020   CO2 30 04/16/2020    Lab Results  Component Value Date   HGBA1C 7.9 (A) 07/23/2020   Recent Results (from the past 2160 hour(s))  POCT HgB A1C     Status: Abnormal   Collection Time: 07/23/20  9:02 AM  Result Value Ref Range   Hemoglobin A1C 7.9 (A) 4.0 - 5.6 %   HbA1c POC (<> result, manual entry)     HbA1c, POC (prediabetic range)     HbA1c, POC (controlled diabetic range)    POCT Glucose (CBG)     Status: Abnormal   Collection Time: 09/15/20  2:48 PM  Result Value Ref Range   POC Glucose 394 (A) 70 - 99 mg/dl  POCT URINALYSIS DIP (CLINITEK)     Status: Abnormal   Collection Time: 09/15/20  2:48 PM  Result Value Ref Range   Color, UA yellow yellow   Clarity, UA clear clear   Glucose, UA =500 (A) negative mg/dL   Bilirubin, UA negative negative   Ketones, POC UA negative negative mg/dL   Spec Grav, UA 1.020 1.010 - 1.025   Blood, UA negative negative   pH, UA 6.0 5.0 - 8.0   POC PROTEIN,UA negative negative, trace   Urobilinogen, UA 0.2 0.2 or 1.0 E.U./dL   Nitrite, UA Negative Negative   Leukocytes, UA Negative Negative        Assessment & Plan:   Problem List Items Addressed This Visit   None  POC glucose 394 UA +glucose 500 mg/dl - negative for ketones CMP pending STAT to check anion gap/renal function Stop Glimeperide Start Lantus 15Units at bedtime - education on subcutaneous injection using Solostar pen provided in office Counseled on home glucose monitoring and correcting glucose<80 with 15G sugar/carb Follow-up with PCP in 8 days (patient is traveling to [place] and will need follow-up before travel) Counseled on ER precautions  No orders of the defined types were placed in  this encounter.    Trixie Dredge, [place]

## 2020-09-16 ENCOUNTER — Encounter: Payer: Self-pay | Admitting: Osteopathic Medicine

## 2020-09-16 ENCOUNTER — Other Ambulatory Visit: Payer: Self-pay | Admitting: Osteopathic Medicine

## 2020-09-17 ENCOUNTER — Other Ambulatory Visit: Payer: Self-pay | Admitting: Osteopathic Medicine

## 2020-09-17 DIAGNOSIS — E1165 Type 2 diabetes mellitus with hyperglycemia: Secondary | ICD-10-CM

## 2020-09-17 MED ORDER — TADALAFIL 20 MG PO TABS: 20.0000 mg | ORAL_TABLET | Freq: Every day | ORAL | 3 refills | Status: DC | PRN

## 2020-09-17 NOTE — Telephone Encounter (Signed)
[facility] requesting a new rx - Prodigy glucose test strips.

## 2020-09-18 ENCOUNTER — Encounter: Payer: Self-pay | Admitting: Osteopathic Medicine

## 2020-09-19 ENCOUNTER — Encounter: Payer: Self-pay | Admitting: Osteopathic Medicine

## 2020-09-19 ENCOUNTER — Telehealth: Payer: Self-pay

## 2020-09-19 NOTE — Telephone Encounter (Signed)
Pt wife LVM. Wife states that Eduard Clos put him on 15 cc of insulin on Monday to be taken at night. The morning readings are: Tues - 250 Wed - 215 Thurs - 170 By 1:30 his insulin is back up to the high 300's Thurs it was 370 in the early pm. Please call [phone].  [name], CMA

## 2020-09-19 NOTE — Telephone Encounter (Signed)
See mychart messages

## 2020-09-19 NOTE — Telephone Encounter (Signed)
I sent a MyChart message back to them w/ instructions for titration up on insulin, he has an appointment w/ me next week as well which he should keep.

## 2020-09-19 NOTE — Telephone Encounter (Signed)
Frederick Guzman's wife called and states he was started on insulin, Lantus 15 units at bedtime. He is still having high numbers in the morning and the afternoon. Should they increase the insulin?  Blood sugars - fasting 250 mg/dl 215 mg/dl 170 mg/dl  Blood sugars - random  300 mg/dl

## 2020-09-23 ENCOUNTER — Telehealth: Payer: Medicare Other | Admitting: Osteopathic Medicine

## 2020-09-24 ENCOUNTER — Encounter: Payer: Self-pay | Admitting: Osteopathic Medicine

## 2020-09-24 ENCOUNTER — Other Ambulatory Visit: Payer: Self-pay

## 2020-09-24 ENCOUNTER — Ambulatory Visit (INDEPENDENT_AMBULATORY_CARE_PROVIDER_SITE_OTHER): Payer: Medicare Other | Admitting: Osteopathic Medicine

## 2020-09-24 VITALS — BP 111/64 | HR 94 | Temp 98.1°F | Ht 72.0 in | Wt 197.0 lb

## 2020-09-24 DIAGNOSIS — R3912 Poor urinary stream: Secondary | ICD-10-CM | POA: Diagnosis not present

## 2020-09-24 DIAGNOSIS — Z20822 Contact with and (suspected) exposure to covid-19: Secondary | ICD-10-CM | POA: Diagnosis not present

## 2020-09-24 DIAGNOSIS — R350 Frequency of micturition: Secondary | ICD-10-CM | POA: Diagnosis not present

## 2020-09-24 DIAGNOSIS — N401 Enlarged prostate with lower urinary tract symptoms: Secondary | ICD-10-CM | POA: Diagnosis not present

## 2020-09-24 DIAGNOSIS — R7309 Other abnormal glucose: Secondary | ICD-10-CM | POA: Diagnosis not present

## 2020-09-24 DIAGNOSIS — E1165 Type 2 diabetes mellitus with hyperglycemia: Secondary | ICD-10-CM | POA: Diagnosis not present

## 2020-09-24 DIAGNOSIS — R81 Glycosuria: Secondary | ICD-10-CM

## 2020-09-24 NOTE — Progress Notes (Signed)
Frederick Guzman is a 70 y.o. male who presents to  [facility]er[facility] at [facility]  today, 09/24/20, seeking care for the following:  DM2 follow-up. Had been relatively stable, A1C was climbing at last routine visit w/ me 07/23/2020 but in shared decision making we opted not to change meds but to work on lifestyle changes and repeat A1C when he got back form traveling. Had some concerns for hyperglycemia and had acute visit w/ one of our APP's 09/15/20, POC Glc was 394 and UA +Glc 500, (-)ketones. D/C glimepiride, start Lantus 15 units qhs and follow up w/ me prior to leaving for travel.  Today is at 22 units Lantus and FBG <200's. I have instructed him and his wife to not worry about mealt ime / afternoon Glc. Fasting is all we really care about as far as basal insulin adjustment.      ASSESSMENT & PLAN with other pertinent findings:  The primary encounter diagnosis was Uncontrolled type 2 diabetes mellitus with hyperglycemia, without long-term current use of insulin ([place]). A diagnosis of Elevated serum glucose with glucosuria was also pertinent to this visit.   Tolerating gradual increase in insulin, currently at 22 units basal.  Still experiencing some hyperglycemia in the afternoons which causes him to feel tired and mild nauseous.  Will be traveling soon.  We discussed ER precautions particularly with regard to symptoms of DKA/HHNK.  Discussed maximum dose of injectable insulin would be 60 units/day, given his response to units in the 20s at this point, I do not think we will need maximum dose but he is free to message me/call us if any questions.  There are no Patient Instructions on file for this visit.  No orders of the defined types were placed in this encounter.   No orders of the defined types were placed in this encounter.    See below for relevant physical exam findings  See below for recent lab and imaging results reviewed   Medications, allergies, PMH, PSH, SocH, FamH reviewed below    Follow-up instructions: Return for [place].                                        Exam:  BP 111/64   Pulse 94   Temp 98.1 F (36.7 C)   Ht 6' (1.829 m)   Wt 197 lb (89.4 kg)   SpO2 95%   BMI 26.72 kg/m  Constitutional: VS see above. General Appearance: alert, well-developed, well-nourished, NAD Neck: No masses, trachea midline.  Respiratory: Normal respiratory effort. no wheeze, no rhonchi, no rales Cardiovascular: S1/S2 normal, no murmur, no rub/gallop auscultated. RRR.  Musculoskeletal: Gait normal. Symmetric and independent movement of all extremities Neurological: Normal balance/coordination. No tremor. Skin: warm, dry, intact.  Psychiatric: Normal judgment/insight. Normal mood and affect. Oriented x3.   Current Meds  Medication Sig   albuterol (PROVENTIL) (2.5 MG/3ML) 0.083% nebulizer solution Take 2.5 mg by nebulization every 6 (six) hours as needed for wheezing or shortness of breath.   albuterol (VENTOLIN HFA) 108 (90 Base) MCG/ACT inhaler Inhale 2 puffs into the lungs every 6 (six) hours as needed for wheezing or shortness of breath.   atorvastatin (LIPITOR) 20 MG tablet Take 1 tablet (20 mg total) by mouth every other day.   b complex vitamins tablet Take 1 tablet by mouth daily.   Budeson-Glycopyrrol-Formoterol (  BREZTRI AEROSPHERE) 160-9-4.8 MCG/ACT AERO Inhale into the lungs.   cetirizine (ZYRTEC) 10 MG tablet Take 10 mg by mouth daily as needed for allergies.    DULoxetine (CYMBALTA) 60 MG capsule Take 1 capsule (60 mg total) by mouth daily.   esomeprazole (NEXIUM) 40 MG capsule Take 1 capsule (40 mg total) by mouth daily.   fluticasone (FLONASE) 50 MCG/ACT nasal spray Place 1 spray into both nostrils daily as needed for allergies.   furosemide (LASIX) 20 MG tablet Take 1 tablet (20 mg total) by mouth 2 (two) times daily as needed for  edema.   glimepiride (AMARYL) 1 MG tablet Take 1 tablet (1 mg total) by mouth daily with breakfast.   glucose blood (PRODIGY NO CODING BLOOD GLUC) test strip Use up to 4 times per day as directed with glucometer.   insulin glargine (LANTUS SOLOSTAR) 100 UNIT/ML Solostar Pen Inject 15 Units into the skin at bedtime.   Insulin Pen Needle 32G X 4 MM MISC For use with Lantus Solostar (Patient taking differently: For use with Lantus Solostar. 22 units of insulin)   lisinopril (ZESTRIL) 2.5 MG tablet Take 1 tablet (2.5 mg total) by mouth at bedtime.   Multiple Vitamin (MULTIVITAMIN WITH MINERALS) TABS tablet Take 1 tablet by mouth at bedtime.   Omega-3 Fatty Acids (FISH OIL) 1000 MG CAPS Take 1,000 mg by mouth at bedtime.    Polyvinyl Alcohol-Povidone (REFRESH OP) Place 1 drop into both eyes daily as needed (dry eyes).   tacrolimus (PROTOPIC) 0.1 % ointment Apply 1 application topically daily.   tadalafil (CIALIS) 20 MG tablet Take 1 tablet (20 mg total) by mouth daily as needed for erectile dysfunction.   tamsulosin (FLOMAX) 0.4 MG CAPS capsule Take 0.4 mg by mouth.   Testosterone 30 MG/ACT SOLN APPLY 1-2 PUMPS DAILY. ALTERNATE TAKING ONE PUMP UNDER ONE ARM ONE DAY, TWO PUMPS THE NEXT DAY (ONE PUMP UNDER EACH ARM)   vitamin C (ASCORBIC ACID) 500 MG tablet Take 500 mg by mouth daily.    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   Combined forms of age-related cataract of right eye 09/21/2018   Testosterone deficiency in male 04/10/2018   Pulmonary nodules 10/23/2017   Sepsis, unspecified organism ([place]) 10/23/2017   Pain in right hand 08/04/2017   Long term use of drug  04/19/2017   Pain in joint of left shoulder 04/19/2017   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   Positive depression screening 09/21/2016   Uncontrolled type 2 diabetes mellitus with hyperglycemia, without long-term current use of insulin ([place]) 09/21/2016   Type 2 diabetes mellitus without complications ([place]) 45/62/5638   Status post shoulder surgery 08/09/2016   Prosthetic shoulder infection, subsequent encounter 08/09/2016   Fibromyalgia 05/21/2016   Infection and inflammatory reaction due to internal orthopedic device, implant, and graft ([place]) 03/31/2016   S/P shoulder replacement, left 01/30/2016   HCAP (healthcare-associated pneumonia) 07/13/2015   Depression    HTN (hypertension)    Acute asthma    Hypertrophy of prostate without urinary obstruction and other lower urinary tract symptoms (LUTS) 06/17/2011   Hypogonadism male 06/17/2011   Arthritis of wrist 05/28/2011   MDD (major depressive disorder) ([place]) 04/30/2011  GERD (gastroesophageal reflux disease) 11/23/2010   DERMATITIS, ATOPIC 11/23/2010   Chest pain 10/07/2010   COPD (chronic obstructive pulmonary disease) ([place]) 10/07/2010   TOBACCO ABUSE 03/10/2007   OBSTRUCTIVE SLEEP APNEA 03/10/2007   BRONCHITIS, CHRONIC 03/10/2007   ASTHMA, MODERATE 03/10/2007    Family History  Problem Relation Age of Onset   Diabetes Mother    Colon cancer Father    Prostate cancer Father    Diabetes Father    Esophageal cancer Neg Hx    Rectal cancer Neg Hx    Stomach cancer Neg Hx     Social History   Tobacco Use  Smoking Status Every Day   Packs/day: 0.75   Years: 43.00   Pack years: 32.25   Types: Cigarettes  Smokeless Tobacco Never  Tobacco Comments   pt no longer taking chantix    Past Surgical History:  Procedure Laterality Date   carpel tunnel Bilateral    COLONOSCOPY W/ POLYPECTOMY     COLONOSCOPY W/ POLYPECTOMY     EXCISIONAL TOTAL SHOULDER ARTHROPLASTY WITH ANTIBIOTIC SPACER Left 08/09/2016   Procedure: LEFT  SHOULDER HARDWARE REMOVAL, IRRIGATION AND DEBRIDEMENT, PLACEMENT OF ANTIBIOTIC SPACER;  Surgeon: Netta Cedars, MD;  Location: [place];  Service: Orthopedics;  Laterality: Left;   IRRIGATION AND DEBRIDEMENT SHOULDER Left 03/31/2016   Procedure: IRRIGATION AND DEBRIDEMENT SHOULDER, deep cultures and polyethylene exchange;  Surgeon: Netta Cedars, MD;  Location: [place];  Service: Orthopedics;  Laterality: Left;  requests 59mins   REVERSE SHOULDER ARTHROPLASTY Left 01/30/2016   Procedure: LEFT REVERSE SHOULDER ARTHROPLASTY;  Surgeon: Netta Cedars, MD;  Location: [place];  Service: Orthopedics;  Laterality: Left;   ROTATOR CUFF REPAIR Left    SHOULDER ARTHROSCOPY WITH DISTAL CLAVICLE RESECTION Right 01/30/2018   Procedure: RIGHT SHOULDER ARTHROSCOPY WITH OPEN DISTAL CLAVICLE RESECTION, SUBACROMIAL DECOMPRESSION;  Surgeon: Netta Cedars, MD;  Location: [place];  Service: Orthopedics;  Laterality: Right;   STERIOD INJECTION Right 01/30/2018   Procedure: STEROID INJECTION;  Surgeon: Netta Cedars, MD;  Location: [place];  Service: Orthopedics;  Laterality: Right;   TOTAL SHOULDER REVISION Left 01/17/2017   Procedure: LEFT SHOULDER REVISION REVERSE TOTAL SHOULDER ARTHROPLASTY;  Surgeon: Netta Cedars, MD;  Location: [place];  Service: Orthopedics;  Laterality: Left;   WRIST SURGERY Right 11/2014   Dr. Amedeo Plenty    Immunization History  Administered Date(s) Administered   Fluad Quad(high Dose 65+) 11/14/2018   Influenza, High Dose Seasonal PF 12/15/2016, 01/19/2018   Influenza, Seasonal, Injecte, Preservative Fre 11/27/2015   Influenza-Unspecified 02/14/2012, 12/27/2012, 12/19/2019   PFIZER(Purple Top)SARS-COV-2 Vaccination 04/13/2019, 05/04/2019, 01/02/2020   Pneumococcal Conjugate-13 03/18/2015   Pneumococcal Polysaccharide-23 01/19/2008, 12/15/2016   Tdap 05/31/2011   Zoster Recombinat (Shingrix) 02/19/2019, 03/20/2019    Recent Results (from the past 2160 hour(s))  POCT HgB A1C     Status: Abnormal    Collection Time: 07/23/20  9:02 AM  Result Value Ref Range   Hemoglobin A1C 7.9 (A) 4.0 - 5.6 %   HbA1c POC (<> result, manual entry)     HbA1c, POC (prediabetic range)     HbA1c, POC (controlled diabetic range)    COMPLETE METABOLIC PANEL WITH GFR     Status: Abnormal   Collection Time: 09/15/20 12:00 AM  Result Value Ref Range   Glucose, Bld 328 (H) 65 - 99 mg/dL    Comment: .            Fasting reference interval . For someone without known diabetes, a glucose value >125 mg/dL indicates  that they may have diabetes and this should be confirmed with a follow-up test. .    BUN 14 7 - 25 mg/dL   Creat 1.09 0.70 - 1.18 mg/dL    Comment: For patients >85 years of age, the reference limit for Creatinine is approximately 13% higher for people identified as African-American. .    GFR, Est Non African American 68 > OR = 60 mL/min/1.52m2   GFR, Est African American 79 > OR = 60 mL/min/1.42m2   BUN/Creatinine Ratio NOT APPLICABLE 6 - 22 (calc)   Sodium 132 (L) 135 - 146 mmol/L   Potassium 4.8 3.5 - 5.3 mmol/L   Chloride 97 (L) 98 - 110 mmol/L   CO2 29 20 - 32 mmol/L   Calcium 9.2 8.6 - 10.3 mg/dL   Total Protein 6.5 6.1 - 8.1 g/dL   Albumin 3.9 3.6 - 5.1 g/dL   Globulin 2.6 1.9 - 3.7 g/dL (calc)   AG Ratio 1.5 1.0 - 2.5 (calc)   Total Bilirubin 0.4 0.2 - 1.2 mg/dL   Alkaline phosphatase (APISO) 104 35 - 144 U/L   AST 15 10 - 35 U/L   ALT 23 9 - 46 U/L  POCT Glucose (CBG)     Status: Abnormal   Collection Time: 09/15/20  2:48 PM  Result Value Ref Range   POC Glucose 394 (A) 70 - 99 mg/dl  POCT URINALYSIS DIP (CLINITEK)     Status: Abnormal   Collection Time: 09/15/20  2:48 PM  Result Value Ref Range   Color, UA yellow yellow   Clarity, UA clear clear   Glucose, UA =500 (A) negative mg/dL   Bilirubin, UA negative negative   Ketones, POC UA negative negative mg/dL   Spec Grav, UA 1.020 1.010 - 1.025   Blood, UA negative negative   pH, UA 6.0 5.0 - 8.0   POC PROTEIN,UA  negative negative, trace   Urobilinogen, UA 0.2 0.2 or 1.0 E.U./dL   Nitrite, UA Negative Negative   Leukocytes, UA Negative Negative    No results found.     All questions at time of visit were answered - patient instructed to contact office with any additional concerns or updates. ER/RTC precautions were reviewed with the patient as applicable.   Please note: manual typing as well as voice recognition software may have been used to produce this document - typos may escape review. Please contact Dr. Sheppard Coil for any needed clarifications.   Total encounter time on date of service, 09/24/20, was 20 minutes spent addressing problems/issues as noted above in [place], including time spent in discussion with patient regarding the HPI, ROS, confirming history, reviewing Assessment & Plan, as well as time spent on coordination of care, record review.

## 2020-10-16 ENCOUNTER — Encounter: Payer: Self-pay | Admitting: Osteopathic Medicine

## 2020-10-24 ENCOUNTER — Other Ambulatory Visit: Payer: Self-pay

## 2020-10-24 DIAGNOSIS — E1165 Type 2 diabetes mellitus with hyperglycemia: Secondary | ICD-10-CM

## 2020-10-24 MED ORDER — LANTUS SOLOSTAR 100 UNIT/ML ~~LOC~~ SOPN
15.0000 [IU] | PEN_INJECTOR | Freq: Every day | SUBCUTANEOUS | 0 refills | Status: DC
Start: 2020-10-24 — End: 2020-10-24

## 2020-10-24 MED ORDER — LANTUS SOLOSTAR 100 UNIT/ML ~~LOC~~ SOPN: 45.0000 [IU] | PEN_INJECTOR | Freq: Every day | SUBCUTANEOUS | 0 refills | Status: DC

## 2020-10-26 ENCOUNTER — Other Ambulatory Visit: Payer: Self-pay | Admitting: Osteopathic Medicine

## 2020-11-05 DIAGNOSIS — F17210 Nicotine dependence, cigarettes, uncomplicated: Secondary | ICD-10-CM | POA: Diagnosis not present

## 2020-11-06 DIAGNOSIS — R918 Other nonspecific abnormal finding of lung field: Secondary | ICD-10-CM | POA: Diagnosis not present

## 2020-11-06 DIAGNOSIS — G4733 Obstructive sleep apnea (adult) (pediatric): Secondary | ICD-10-CM | POA: Diagnosis not present

## 2020-11-06 DIAGNOSIS — J441 Chronic obstructive pulmonary disease with (acute) exacerbation: Secondary | ICD-10-CM | POA: Diagnosis not present

## 2020-11-17 ENCOUNTER — Other Ambulatory Visit: Payer: Self-pay | Admitting: Osteopathic Medicine

## 2020-11-17 DIAGNOSIS — J449 Chronic obstructive pulmonary disease, unspecified: Secondary | ICD-10-CM | POA: Diagnosis not present

## 2020-11-17 DIAGNOSIS — G4733 Obstructive sleep apnea (adult) (pediatric): Secondary | ICD-10-CM | POA: Diagnosis not present

## 2020-11-17 DIAGNOSIS — R918 Other nonspecific abnormal finding of lung field: Secondary | ICD-10-CM | POA: Diagnosis not present

## 2020-11-19 ENCOUNTER — Other Ambulatory Visit: Payer: Self-pay | Admitting: Osteopathic Medicine

## 2020-11-20 ENCOUNTER — Telehealth: Payer: Self-pay

## 2020-11-20 MED ORDER — BUPROPION HCL ER (XL) 300 MG PO TB24
300.0000 mg | ORAL_TABLET | Freq: Every day | ORAL | 3 refills | Status: DC
Start: 2020-11-20 — End: 2021-08-04

## 2020-11-20 NOTE — Telephone Encounter (Signed)
Patient called requesting med refill for Wellbutrin 300 mg. Rx not listed in active med list. Patient is currently out of med.

## 2020-11-26 ENCOUNTER — Encounter: Payer: Self-pay | Admitting: Osteopathic Medicine

## 2020-11-26 ENCOUNTER — Ambulatory Visit (INDEPENDENT_AMBULATORY_CARE_PROVIDER_SITE_OTHER): Payer: Medicare Other | Admitting: Osteopathic Medicine

## 2020-11-26 ENCOUNTER — Other Ambulatory Visit: Payer: Self-pay

## 2020-11-26 VITALS — BP 122/79 | HR 79 | Temp 97.9°F | Wt 201.0 lb

## 2020-11-26 DIAGNOSIS — E1169 Type 2 diabetes mellitus with other specified complication: Secondary | ICD-10-CM | POA: Diagnosis not present

## 2020-11-26 DIAGNOSIS — E1165 Type 2 diabetes mellitus with hyperglycemia: Secondary | ICD-10-CM

## 2020-11-26 LAB — POCT GLYCOSYLATED HEMOGLOBIN (HGB A1C): Hemoglobin A1C: 7.6 % — AB (ref 4.0–5.6)

## 2020-11-26 MED ORDER — LANTUS SOLOSTAR 100 UNIT/ML ~~LOC~~ SOPN: 50.0000 [IU] | PEN_INJECTOR | Freq: Every day | SUBCUTANEOUS | 3 refills | Status: DC

## 2020-11-26 MED ORDER — DULOXETINE HCL 20 MG PO CPEP: ORAL_CAPSULE | ORAL | 0 refills | Status: DC

## 2020-11-26 NOTE — Progress Notes (Signed)
Frederick Guzman is a 70 y.o. male who presents to  [facility]  today, 11/26/20, seeking care for the following:  Diabetes: has increased insulin to 50 units daily, FBG today 114 as compared to previous around 200. Reports feeling better, fewer issues w/ hyperglycemia. Last A1C 07/23/20 was 7.9. Today is 7.6.  Rx: Glimepiride 1 mg daily, Lantus 50 units daily. On lower potency statin d/t hx intolerance at higher doses, he is on low dose ACE. BP at goal  Eye exam done in past year  Reviewed last labs from 03/2020: PSA, CBC, CMP ok. LDL 85.  Concern for lower libido since increase Celexa, but moods overall have been a lot better. Would like to try lower dose Celexa      ASSESSMENT & PLAN with other pertinent findings:  The primary encounter diagnosis was Type 2 diabetes mellitus with other specified complication, without long-term current use of insulin ([place]). A diagnosis of Uncontrolled type 2 diabetes mellitus with hyperglycemia, without long-term current use of insulin ([facility]) was also pertinent to this visit.    Patient Instructions  Cymbalta:  Sending 20 mg pills Take 2 (40 mg) for one week Then take 1 (20 mg) daily  Insulin: Keep at 50 units daily!    Orders Placed This Encounter  Procedures   POCT HgB A1C    Meds ordered this encounter  Medications   DULoxetine (CYMBALTA) 20 MG capsule    Sig: Take 2 capsules (40 mg total) by mouth daily for 7 days, THEN 1 capsule (20 mg total) daily.    Dispense:  97 capsule    Refill:  0   insulin glargine (LANTUS SOLOSTAR) 100 UNIT/ML Solostar Pen    Sig: Inject 50-60 Units into the skin at bedtime.    Dispense:  60 mL    Refill:  3     See below for relevant physical exam findings  See below for recent lab and imaging results reviewed  Medications, allergies, PMH, PSH, SocH, FamH reviewed below    Follow-up instructions: Return in about 4 months (around 03/28/2021)  for [place] .                                        Exam:  BP 122/79 (BP Location: Left Arm, Patient Position: Sitting, Cuff Size: Normal)   Pulse 79   Temp 97.9 F (36.6 C) (Oral)   Wt 201 lb 0.6 oz (91.2 kg)   BMI 27.27 kg/m  Constitutional: VS see above. General Appearance: alert, well-developed, well-nourished, NAD Neck: No masses, trachea midline.  Respiratory: Normal respiratory effort. no wheeze, no rhonchi, no rales Cardiovascular: S1/S2 normal, no murmur, no rub/gallop auscultated. RRR.  Musculoskeletal: Gait normal. Symmetric and independent movement of all extremities Neurological: Normal balance/coordination. No tremor. Skin: warm, dry, intact.  Psychiatric: Normal judgment/insight. Normal mood and affect. Oriented x3.   Current Meds  Medication Sig   albuterol (PROVENTIL) (2.5 MG/3ML) 0.083% nebulizer solution Take 2.5 mg by nebulization every 6 (six) hours as needed for wheezing or shortness of breath.   albuterol (VENTOLIN HFA) 108 (90 Base) MCG/ACT inhaler Inhale 2 puffs into the lungs every 6 (six) hours as needed for wheezing or shortness of breath.   atorvastatin (LIPITOR) 20 MG tablet Take 1 tablet (20 mg total) by mouth every other day.   b  complex vitamins tablet Take 1 tablet by mouth daily.   Budeson-Glycopyrrol-Formoterol (BREZTRI AEROSPHERE) 160-9-4.8 MCG/ACT AERO Inhale into the lungs.   buPROPion (WELLBUTRIN XL) 300 MG 24 hr tablet Take 1 tablet (300 mg total) by mouth daily.   cetirizine (ZYRTEC) 10 MG tablet Take 10 mg by mouth daily as needed for allergies.    DULoxetine (CYMBALTA) 20 MG capsule Take 2 capsules (40 mg total) by mouth daily for 7 days, THEN 1 capsule (20 mg total) daily.   esomeprazole (NEXIUM) 40 MG capsule Take 1 capsule (40 mg total) by mouth daily.   fluticasone (FLONASE) 50 MCG/ACT nasal spray Place 1 spray into both nostrils daily as needed for allergies.   furosemide  (LASIX) 20 MG tablet Take 1 tablet (20 mg total) by mouth 2 (two) times daily as needed for edema.   glucose blood (PRODIGY NO CODING BLOOD GLUC) test strip Use up to 4 times per day as directed with glucometer.   Insulin Pen Needle 32G X 4 MM MISC For use with Lantus Solostar (Patient taking differently: For use with Lantus Solostar. 22 units of insulin)   lisinopril (ZESTRIL) 2.5 MG tablet Take 1 tablet (2.5 mg total) by mouth at bedtime.   Multiple Vitamin (MULTIVITAMIN WITH MINERALS) TABS tablet Take 1 tablet by mouth at bedtime.   Omega-3 Fatty Acids (FISH OIL) 1000 MG CAPS Take 1,000 mg by mouth at bedtime.    Polyvinyl Alcohol-Povidone (REFRESH OP) Place 1 drop into both eyes daily as needed (dry eyes).   tacrolimus (PROTOPIC) 0.1 % ointment Apply 1 application topically daily.   tadalafil (CIALIS) 20 MG tablet Take 1 tablet (20 mg total) by mouth daily as needed for erectile dysfunction.   tamsulosin (FLOMAX) 0.4 MG CAPS capsule Take 0.4 mg by mouth.   Testosterone 30 MG/ACT SOLN APPLY 1-2 PUMPS DAILY. ALTERNATE TAKING ONE PUMP UNDER ONE ARM ONE DAY, TWO PUMPS THE NEXT DAY (ONE PUMP UNDER EACH ARM)   vitamin C (ASCORBIC ACID) 500 MG tablet Take 500 mg by mouth daily.   [DISCONTINUED] DULoxetine (CYMBALTA) 60 MG capsule Take 1 capsule (60 mg total) by mouth daily.   [DISCONTINUED] glimepiride (AMARYL) 1 MG tablet Take 1 tablet (1 mg total) by mouth daily with breakfast.   [DISCONTINUED] insulin glargine (LANTUS SOLOSTAR) 100 UNIT/ML Solostar Pen Inject 45 Units into the skin at bedtime.    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   Combined forms of  age-related cataract of right eye 09/21/2018   Testosterone deficiency in male 04/10/2018   Pulmonary nodules 10/23/2017   Sepsis, unspecified organism ([place]) 10/23/2017   Pain in right hand 08/04/2017   Long term use of drug 04/19/2017   Pain in joint of left shoulder 04/19/2017   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   Positive depression screening 09/21/2016   Uncontrolled type 2 diabetes mellitus with hyperglycemia, without long-term current use of insulin ([place]) 09/21/2016   Type 2 diabetes mellitus without complications ([place]) 99991111   Status post shoulder surgery 08/09/2016   Prosthetic shoulder infection, subsequent encounter 08/09/2016   Fibromyalgia 05/21/2016   Infection and inflammatory reaction due to internal orthopedic device, implant, and graft ([place]) 03/31/2016   S/P  shoulder replacement, left 01/30/2016   HCAP (healthcare-associated pneumonia) 07/13/2015   Depression    HTN (hypertension)    Acute asthma    Hypertrophy of prostate without urinary obstruction and other lower urinary tract symptoms (LUTS) 06/17/2011   Hypogonadism male 06/17/2011   Arthritis of wrist 05/28/2011   MDD (major depressive disorder) ([place]) 04/30/2011   GERD (gastroesophageal reflux disease) 11/23/2010   DERMATITIS, ATOPIC 11/23/2010   Chest pain 10/07/2010   COPD (chronic obstructive pulmonary disease) ([place]) 10/07/2010   TOBACCO ABUSE 03/10/2007   OBSTRUCTIVE SLEEP APNEA 03/10/2007   BRONCHITIS, CHRONIC 03/10/2007   ASTHMA, MODERATE 03/10/2007    Family History  Problem Relation Age of Onset   Diabetes Mother    Colon cancer Father    Prostate cancer Father    Diabetes Father    Esophageal cancer Neg Hx    Rectal cancer Neg Hx    Stomach cancer Neg Hx     Social History   Tobacco Use  Smoking Status Every Day   Packs/day: 0.75   Years: 43.00   Pack years: 32.25   Types: Cigarettes  Smokeless Tobacco Never  Tobacco Comments   pt no longer taking chantix    Past Surgical  History:  Procedure Laterality Date   carpel tunnel Bilateral    COLONOSCOPY W/ POLYPECTOMY     COLONOSCOPY W/ POLYPECTOMY     EXCISIONAL TOTAL SHOULDER ARTHROPLASTY WITH ANTIBIOTIC SPACER Left 08/09/2016   Procedure: LEFT SHOULDER HARDWARE REMOVAL, IRRIGATION AND DEBRIDEMENT, PLACEMENT OF ANTIBIOTIC SPACER;  Surgeon: Netta Cedars, MD;  Location: [place];  Service: Orthopedics;  Laterality: Left;   IRRIGATION AND DEBRIDEMENT SHOULDER Left 03/31/2016   Procedure: IRRIGATION AND DEBRIDEMENT SHOULDER, deep cultures and polyethylene exchange;  Surgeon: Netta Cedars, MD;  Location: [place];  Service: Orthopedics;  Laterality: Left;  requests 66mns   REVERSE SHOULDER ARTHROPLASTY Left 01/30/2016   Procedure: LEFT REVERSE SHOULDER ARTHROPLASTY;  Surgeon: SNetta Cedars MD;  [place] [facility]  Service: Orthopedics;  Laterality: Left;   ROTATOR CUFF REPAIR Left    SHOULDER ARTHROSCOPY WITH DISTAL CLAVICLE RESECTION Right 01/30/2018   Procedure: RIGHT SHOULDER ARTHROSCOPY WITH OPEN DISTAL CLAVICLE RESECTION, SUBACROMIAL DECOMPRESSION;  Surgeon: NNetta Cedars MD;  [place] [facility]  Service: Orthopedics;  Laterality: Right;   STERIOD INJECTION Right 01/30/2018   Procedure: STEROID INJECTION;  Surgeon: NNetta Cedars MD;  [place] [facility]  Service: Orthopedics;  Laterality: Right;   TOTAL SHOULDER REVISION Left 01/17/2017   Procedure: LEFT SHOULDER REVISION REVERSE TOTAL SHOULDER ARTHROPLASTY;  Surgeon: NNetta Cedars MD;  [place] [facility]  Service: Orthopedics;  Laterality: Left;   WRIST SURGERY Right 11/2014   Dr. GAmedeo Plenty   Immunization History  Administered Date(s) Administered   Fluad Quad(high Dose 65+) 11/14/2018   Influenza, High Dose Seasonal PF 12/15/2016, 01/19/2018   Influenza, Seasonal, Injecte, Preservative Fre 11/27/2015   Influenza-Unspecified 02/14/2012, 12/27/2012, 12/19/2019   PFIZER(Purple Top)SARS-COV-2 Vaccination 04/13/2019, 05/04/2019, 01/02/2020   Pneumococcal Conjugate-13  03/18/2015   Pneumococcal Polysaccharide-23 01/19/2008, 12/15/2016   Tdap 05/31/2011   Zoster Recombinat (Shingrix) 02/19/2019, 03/20/2019    Recent Results (from the past 2160 hour(s))  COMPLETE METABOLIC PANEL WITH GFR     Status: Abnormal   Collection Time: 09/15/20 12:00 AM  Result Value Ref Range   Glucose, Bld 328 (H) 65 - 99 mg/dL    Comment: .            Fasting reference interval . For someone without known diabetes, a glucose value >125 mg/dL  indicates that they may have diabetes and this should be confirmed with a follow-up test. .    BUN 14 7 - 25 mg/dL   Creat 1.09 0.70 - 1.18 mg/dL    Comment: For patients >61 years of age, the reference limit for Creatinine is approximately 13% higher for people identified as African-American. .    GFR, Est Non African American 68 > OR = 60 mL/min/1.69m   GFR, Est African American 79 > OR = 60 mL/min/1.763m  BUN/Creatinine Ratio NOT APPLICABLE 6 - 22 (calc)   Sodium 132 (L) 135 - 146 mmol/L   Potassium 4.8 3.5 - 5.3 mmol/L   Chloride 97 (L) 98 - 110 mmol/L   CO2 29 20 - 32 mmol/L   Calcium 9.2 8.6 - 10.3 mg/dL   Total Protein 6.5 6.1 - 8.1 g/dL   Albumin 3.9 3.6 - 5.1 g/dL   Globulin 2.6 1.9 - 3.7 g/dL (calc)   AG Ratio 1.5 1.0 - 2.5 (calc)   Total Bilirubin 0.4 0.2 - 1.2 mg/dL   Alkaline phosphatase (APISO) 104 35 - 144 U/L   AST 15 10 - 35 U/L   ALT 23 9 - 46 U/L  POCT Glucose (CBG)     Status: Abnormal   Collection Time: 09/15/20  2:48 PM  Result Value Ref Range   POC Glucose 394 (A) 70 - 99 mg/dl  POCT URINALYSIS DIP (CLINITEK)     Status: Abnormal   Collection Time: 09/15/20  2:48 PM  Result Value Ref Range   Color, UA yellow yellow   Clarity, UA clear clear   Glucose, UA =500 (A) negative mg/dL   Bilirubin, UA negative negative   Ketones, POC UA negative negative mg/dL   Spec Grav, UA 1.020 1.010 - 1.025   Blood, UA negative negative   pH, UA 6.0 5.0 - 8.0   POC PROTEIN,UA negative negative, trace    Urobilinogen, UA 0.2 0.2 or 1.0 E.U./dL   Nitrite, UA Negative Negative   Leukocytes, UA Negative Negative  POCT HgB A1C     Status: Abnormal   Collection Time: 11/26/20  9:21 AM  Result Value Ref Range   Hemoglobin A1C 7.6 (A) 4.0 - 5.6 %   HbA1c POC (<> result, manual entry)     HbA1c, POC (prediabetic range)     HbA1c, POC (controlled diabetic range)      No results found.     All questions at time of visit were answered - patient instructed to contact office with any additional concerns or updates. ER/RTC precautions were reviewed with the patient as applicable.   Please note: manual typing as well as voice recognition software may have been used to produce this document - typos may escape review. Please contact Dr. AlSheppard Coilor any needed clarifications.

## 2020-11-26 NOTE — Patient Instructions (Signed)
Cymbalta:  Sending 20 mg pills Take 2 (40 mg) for one week Then take 1 (20 mg) daily  Insulin: Keep at 50 units daily!

## 2020-12-12 DIAGNOSIS — N5201 Erectile dysfunction due to arterial insufficiency: Secondary | ICD-10-CM | POA: Diagnosis not present

## 2020-12-12 DIAGNOSIS — R3912 Poor urinary stream: Secondary | ICD-10-CM | POA: Diagnosis not present

## 2020-12-12 DIAGNOSIS — N401 Enlarged prostate with lower urinary tract symptoms: Secondary | ICD-10-CM | POA: Diagnosis not present

## 2020-12-12 DIAGNOSIS — J449 Chronic obstructive pulmonary disease, unspecified: Secondary | ICD-10-CM | POA: Diagnosis not present

## 2020-12-12 DIAGNOSIS — R350 Frequency of micturition: Secondary | ICD-10-CM | POA: Diagnosis not present

## 2020-12-12 DIAGNOSIS — G4733 Obstructive sleep apnea (adult) (pediatric): Secondary | ICD-10-CM | POA: Diagnosis not present

## 2020-12-15 DIAGNOSIS — H524 Presbyopia: Secondary | ICD-10-CM | POA: Diagnosis not present

## 2020-12-15 DIAGNOSIS — E119 Type 2 diabetes mellitus without complications: Secondary | ICD-10-CM | POA: Diagnosis not present

## 2020-12-15 LAB — HM DIABETES EYE EXAM

## 2020-12-19 DIAGNOSIS — Z23 Encounter for immunization: Secondary | ICD-10-CM | POA: Diagnosis not present

## 2020-12-30 ENCOUNTER — Emergency Department (HOSPITAL_BASED_OUTPATIENT_CLINIC_OR_DEPARTMENT_OTHER): Payer: Medicare Other

## 2020-12-30 ENCOUNTER — Emergency Department (HOSPITAL_BASED_OUTPATIENT_CLINIC_OR_DEPARTMENT_OTHER)
Admission: EM | Admit: 2020-12-30 | Discharge: 2020-12-30 | Disposition: A | Discharge status: Home / Self Care | Payer: Medicare Other | Attending: Emergency Medicine | Admitting: Emergency Medicine

## 2020-12-30 ENCOUNTER — Other Ambulatory Visit: Payer: Self-pay

## 2020-12-30 ENCOUNTER — Encounter (HOSPITAL_BASED_OUTPATIENT_CLINIC_OR_DEPARTMENT_OTHER): Payer: Self-pay | Admitting: Emergency Medicine

## 2020-12-30 ENCOUNTER — Telehealth: Payer: Self-pay

## 2020-12-30 DIAGNOSIS — J449 Chronic obstructive pulmonary disease, unspecified: Secondary | ICD-10-CM | POA: Diagnosis not present

## 2020-12-30 DIAGNOSIS — R079 Chest pain, unspecified: Secondary | ICD-10-CM | POA: Diagnosis not present

## 2020-12-30 DIAGNOSIS — E119 Type 2 diabetes mellitus without complications: Secondary | ICD-10-CM | POA: Diagnosis not present

## 2020-12-30 DIAGNOSIS — F17210 Nicotine dependence, cigarettes, uncomplicated: Secondary | ICD-10-CM | POA: Insufficient documentation

## 2020-12-30 DIAGNOSIS — Z85820 Personal history of malignant melanoma of skin: Secondary | ICD-10-CM | POA: Insufficient documentation

## 2020-12-30 DIAGNOSIS — Z7951 Long term (current) use of inhaled steroids: Secondary | ICD-10-CM | POA: Insufficient documentation

## 2020-12-30 DIAGNOSIS — R1013 Epigastric pain: Secondary | ICD-10-CM | POA: Insufficient documentation

## 2020-12-30 DIAGNOSIS — Z96611 Presence of right artificial shoulder joint: Secondary | ICD-10-CM | POA: Insufficient documentation

## 2020-12-30 DIAGNOSIS — J45909 Unspecified asthma, uncomplicated: Secondary | ICD-10-CM | POA: Insufficient documentation

## 2020-12-30 DIAGNOSIS — Z79899 Other long term (current) drug therapy: Secondary | ICD-10-CM | POA: Insufficient documentation

## 2020-12-30 DIAGNOSIS — R0789 Other chest pain: Secondary | ICD-10-CM | POA: Insufficient documentation

## 2020-12-30 DIAGNOSIS — K219 Gastro-esophageal reflux disease without esophagitis: Secondary | ICD-10-CM | POA: Diagnosis not present

## 2020-12-30 DIAGNOSIS — I10 Essential (primary) hypertension: Secondary | ICD-10-CM | POA: Insufficient documentation

## 2020-12-30 LAB — D-DIMER, QUANTITATIVE: D-Dimer, Quant: <0.27 ug/mL-FEU (ref 0.00–0.50)

## 2020-12-30 LAB — COMPREHENSIVE METABOLIC PANEL
ALT: 34 U/L (ref 0–44)
AST: 28 U/L (ref 15–41)
Albumin: 3.7 g/dL (ref 3.5–5.0)
Alkaline Phosphatase: 81 U/L (ref 38–126)
Anion gap: 6 (ref 5–15)
BUN: 16 mg/dL (ref 8–23)
CO2: 28 mmol/L (ref 22–32)
Calcium: 8.8 mg/dL — ABNORMAL LOW (ref 8.9–10.3)
Chloride: 102 mmol/L (ref 98–111)
Creatinine, Ser: 1.08 mg/dL (ref 0.61–1.24)
GFR, Estimated: >60 mL/min (ref >60)
Glucose, Bld: 141 mg/dL — ABNORMAL HIGH (ref 70–99)
Potassium: 4 mmol/L (ref 3.5–5.1)
Sodium: 136 mmol/L (ref 135–145)
Total Bilirubin: 0.6 mg/dL (ref 0.3–1.2)
Total Protein: 7 g/dL (ref 6.5–8.1)

## 2020-12-30 LAB — CBC WITH DIFFERENTIAL/PLATELET
Abs Immature Granulocytes: 0.03 10*3/uL (ref 0.00–0.07)
Basophils Absolute: 0 10*3/uL (ref 0.0–0.1)
Basophils Relative: 0 %
Eosinophils Absolute: 0.2 10*3/uL (ref 0.0–0.5)
Eosinophils Relative: 2 %
HCT: 44.5 % (ref 39.0–52.0)
Hemoglobin: 15.5 g/dL (ref 13.0–17.0)
Immature Granulocytes: 0 %
Lymphocytes Relative: 19 %
Lymphs Abs: 1.7 10*3/uL (ref 0.7–4.0)
MCH: 32 pg (ref 26.0–34.0)
MCHC: 34.8 g/dL (ref 30.0–36.0)
MCV: 91.9 fL (ref 80.0–100.0)
Monocytes Absolute: 0.5 10*3/uL (ref 0.1–1.0)
Monocytes Relative: 6 %
Neutro Abs: 6.6 10*3/uL (ref 1.7–7.7)
Neutrophils Relative %: 73 %
Platelets: 220 10*3/uL (ref 150–400)
RBC: 4.84 MIL/uL (ref 4.22–5.81)
RDW: 13.1 % (ref 11.5–15.5)
WBC: 9.1 10*3/uL (ref 4.0–10.5)
nRBC: 0 % (ref 0.0–0.2)

## 2020-12-30 LAB — TROPONIN I (HIGH SENSITIVITY)
Troponin I (High Sensitivity): 8 ng/L (ref <18)
Troponin I (High Sensitivity): 7 ng/L (ref <18)

## 2020-12-30 LAB — BRAIN NATRIURETIC PEPTIDE: B Natriuretic Peptide: 39.2 pg/mL (ref 0.0–100.0)

## 2020-12-30 LAB — LIPASE, BLOOD: Lipase: 21 U/L (ref 11–51)

## 2020-12-30 MED ORDER — ALUM & MAG HYDROXIDE-SIMETH 200-200-20 MG/5ML PO SUSP
30.0000 mL | Freq: Once | ORAL | Status: AC
  Administered 2020-12-30: 30 mL via ORAL
  Filled 2020-12-30: qty 30

## 2020-12-30 MED ORDER — ALBUTEROL SULFATE HFA 108 (90 BASE) MCG/ACT IN AERS: 2.0000 | INHALATION_SPRAY | Freq: Once | RESPIRATORY_TRACT | Status: AC

## 2020-12-30 MED ORDER — ALBUTEROL SULFATE HFA 108 (90 BASE) MCG/ACT IN AERS
INHALATION_SPRAY | RESPIRATORY_TRACT | Status: AC
  Administered 2020-12-30: 2 via RESPIRATORY_TRACT
  Filled 2020-12-30: qty 6.7

## 2020-12-30 MED ORDER — ASPIRIN 81 MG PO CHEW
324.0000 mg | CHEWABLE_TABLET | Freq: Once | ORAL | Status: AC
  Administered 2020-12-30: 324 mg via ORAL
  Filled 2020-12-30: qty 4

## 2020-12-30 MED ORDER — NITROGLYCERIN 0.4 MG SL SUBL
0.4000 mg | SUBLINGUAL_TABLET | SUBLINGUAL | Status: AC | PRN
Start: 2020-12-30 — End: 2020-12-30
  Administered 2020-12-30 (×3): 0.4 mg via SUBLINGUAL
  Filled 2020-12-30: qty 1

## 2020-12-30 NOTE — ED Provider Notes (Signed)
[place] [facility] EMERGENCY DEPARTMENT Provider Note   CSN: [id number] Arrival date & time: 12/30/20  5573     History Chief Complaint  Patient presents with   Abdominal Pain   Chest Pain    Frederick Guzman is a 70 y.o. male.  70 yo M with a chief complaints of chest pain.  This woke him up from sleep about midnight.  Described as an uncomfortable feeling in the center of his chest.  Denies radiation of the pain.  Felt like initially it may have started in his abdomen.  Try to take medicine for reflux without improvement.  Denies nausea or vomiting.  Nothing else seems to make it better or worse.  No history of MI.  Has a history of hypertension hyperlipidemia diabetes is an active smoker and father had an MI in his early 10s.  He denies history of PE or DVT denies hemoptysis denies unilateral lower extremity edema.  He takes testosterone for low T.  The history is provided by the patient.  Abdominal Pain Associated symptoms: chest pain   Associated symptoms: no chills, no diarrhea, no fever, no shortness of breath and no vomiting   Chest Pain Pain location:  Epigastric Pain quality: pressure   Pain radiates to:  Does not radiate Pain severity:  Severe Onset quality:  Sudden Duration:  10 hours Timing:  Constant Progression:  Worsening Chronicity:  New Relieved by:  Nothing Worsened by:  Nothing Ineffective treatments:  None tried Associated symptoms: abdominal pain   Associated symptoms: no fever, no headache, no palpitations, no shortness of breath and no vomiting       Past Medical History:  Diagnosis Date   Allergy    Arthritis    OA   Asthma    Cancer ([place]) 08/2014   melanoma on R shoulder & back - [facility]    COPD (chronic obstructive pulmonary disease) ([place])    Depression    pt. denies- 01/2016, pt on Wellbutrin for smoking cessation   Diabetes mellitus without complication ([place])    Diverticulitis    Dyspnea    with exertion   Fibromyalgia     GERD (gastroesophageal reflux disease)    Hyperlipemia    Hypertension    "never was high"   Lymphocytic colitis 04/2018   Pneumonia 06/2015, 10/2016   Sleep apnea    last study - Nov. 2017, waiting for a new machine, using the old CPAP q night after cleaning the device after he was documented to have pneumonia from his CPAP machine.     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   Positive depression screening 09/21/2016   Uncontrolled type 2 diabetes mellitus with hyperglycemia, without long-term current use of insulin ([place]) 09/21/2016   Type 2 diabetes mellitus without complications ([place]) 22/04/5425   Status post shoulder surgery 08/09/2016   Prosthetic shoulder infection, subsequent encounter 08/09/2016   Fibromyalgia 05/21/2016   Infection and inflammatory reaction due to internal orthopedic device, implant, and graft ([place]) 03/31/2016   S/P shoulder replacement, left 01/30/2016   HCAP (healthcare-associated pneumonia)  07/13/2015   Depression    HTN (hypertension)    Acute asthma    Hypertrophy of prostate without urinary obstruction and other lower urinary tract symptoms (LUTS) 06/17/2011   Hypogonadism male 06/17/2011   Arthritis of wrist 05/28/2011   MDD (major depressive disorder) ([place]) 04/30/2011   GERD (gastroesophageal reflux disease) 11/23/2010   DERMATITIS, ATOPIC 11/23/2010   Chest pain 10/07/2010   COPD (chronic obstructive pulmonary disease) ([place]) 10/07/2010   TOBACCO ABUSE 03/10/2007   OBSTRUCTIVE SLEEP APNEA 03/10/2007    BRONCHITIS, CHRONIC 03/10/2007   ASTHMA, MODERATE 03/10/2007    Past Surgical History:  Procedure Laterality Date   carpel tunnel Bilateral    COLONOSCOPY W/ POLYPECTOMY     COLONOSCOPY W/ POLYPECTOMY     EXCISIONAL TOTAL SHOULDER ARTHROPLASTY WITH ANTIBIOTIC SPACER Left 08/09/2016   Procedure: LEFT SHOULDER HARDWARE REMOVAL, IRRIGATION AND DEBRIDEMENT, PLACEMENT OF ANTIBIOTIC SPACER;  Surgeon: Netta Cedars, MD;  Location: [facility];  Service: Orthopedics;  Laterality: Left;   IRRIGATION AND DEBRIDEMENT SHOULDER Left 03/31/2016   Procedure: IRRIGATION AND DEBRIDEMENT SHOULDER, deep cultures and polyethylene exchange;  Surgeon: Netta Cedars, MD;  Location: [facility];  Service: Orthopedics;  Laterality: Left;  requests 49mins   REVERSE SHOULDER ARTHROPLASTY Left 01/30/2016   Procedure: LEFT REVERSE SHOULDER ARTHROPLASTY;  Surgeon: Netta Cedars, MD;  Location: [facility];  Service: Orthopedics;  Laterality: Left;   ROTATOR CUFF REPAIR Left    SHOULDER ARTHROSCOPY WITH DISTAL CLAVICLE RESECTION Right 01/30/2018   Procedure: RIGHT SHOULDER ARTHROSCOPY WITH OPEN DISTAL CLAVICLE RESECTION, SUBACROMIAL DECOMPRESSION;  Surgeon: Netta Cedars, MD;  Location: [facility];  Service: Orthopedics;  Laterality: Right;   STERIOD INJECTION Right 01/30/2018   Procedure: STEROID INJECTION;  Surgeon: Netta Cedars, MD;  Location: [facility];  Service: Orthopedics;  Laterality: Right;   TOTAL SHOULDER REVISION Left 01/17/2017   Procedure: LEFT SHOULDER REVISION REVERSE TOTAL SHOULDER ARTHROPLASTY;  Surgeon: Netta Cedars, MD;  Location: [facility];  Service: Orthopedics;  Laterality: Left;   WRIST SURGERY Right 11/2014   Dr. Amedeo Plenty       Family History  Problem Relation Age of Onset   Diabetes Mother    Colon cancer Father    Prostate cancer Father    Diabetes Father    Esophageal cancer Neg Hx    Rectal cancer Neg Hx    Stomach cancer Neg Hx     Social History   Tobacco Use   Smoking status: Every Day    Packs/day: 0.75     Years: 43.00    Pack years: 32.25    Types: Cigarettes   Smokeless tobacco: Never   Tobacco comments:    pt no longer taking chantix  Vaping Use   Vaping Use: Never used  Substance Use Topics   Alcohol use: Yes    Alcohol/week: 9.0 standard drinks    Types: 6 Cans of beer, 3 Shots of liquor per week    Comment: beer or mixed drink    Drug use: No    Home Medications Prior to Admission medications   Medication Sig Start Date End Date Taking? Authorizing Provider  albuterol (PROVENTIL) (2.5 MG/3ML) 0.083% nebulizer solution Take 2.5 mg by nebulization every 6 (six) hours as needed for wheezing or shortness of breath.    [provider]  albuterol (VENTOLIN HFA) 108 (90 Base) MCG/ACT inhaler Inhale 2 puffs into the lungs every 6 (six) hours as needed for wheezing or shortness of breath.    [provider]  atorvastatin (LIPITOR) 20  MG tablet Take 1 tablet (20 mg total) by mouth every other day. 02/21/20   [name], DO  b complex vitamins tablet Take 1 tablet by mouth daily.    [provider]  Budeson-Glycopyrrol-Formoterol (BREZTRI AEROSPHERE) 160-9-4.8 MCG/ACT AERO Inhale into the lungs. 11/14/19   [provider]  buPROPion (WELLBUTRIN XL) 300 MG 24 hr tablet Take 1 tablet (300 mg total) by mouth daily. 11/20/20   [name], DO  cetirizine (ZYRTEC) 10 MG tablet Take 10 mg by mouth daily as needed for allergies.     [provider]  DULoxetine (CYMBALTA) 20 MG capsule Take 2 capsules (40 mg total) by mouth daily for 7 days, THEN 1 capsule (20 mg total) daily. 11/26/20 02/24/21  [name], DO  esomeprazole (NEXIUM) 40 MG capsule Take 1 capsule (40 mg total) by mouth daily. 09/01/20   [name], DO  fluticasone [facility]) 50 MCG/ACT nasal spray Place 1 spray into both nostrils daily as needed for allergies. 12/30/15   [provider]  furosemide (LASIX) 20 MG tablet Take 1 tablet (20 mg total) by mouth 2  (two) times daily as needed for edema. 10/18/18   [name], DO  glucose blood (PRODIGY NO CODING BLOOD GLUC) test strip Use up to 4 times per day as directed with glucometer. 09/17/20   [name], DO  insulin glargine (LANTUS SOLOSTAR) 100 UNIT/ML Solostar Pen Inject 50-60 Units into the skin at bedtime. 11/26/20   [name], DO  Insulin Pen Needle 32G X 4 MM MISC For use with Lantus Solostar Patient taking differently: For use with Lantus Solostar. 22 units of insulin 09/15/20   Trixie Dredge, PA-C  lisinopril (ZESTRIL) 2.5 MG tablet Take 1 tablet (2.5 mg total) by mouth at bedtime. 08/21/20   [name], DO  Multiple Vitamin (MULTIVITAMIN WITH MINERALS) TABS tablet Take 1 tablet by mouth at bedtime.    [provider]  Omega-3 Fatty Acids (FISH OIL) 1000 MG CAPS Take 1,000 mg by mouth at bedtime.     [provider]  Polyvinyl Alcohol-Povidone (REFRESH OP) Place 1 drop into both eyes daily as needed (dry eyes).    [provider]  tacrolimus (PROTOPIC) 0.1 % ointment Apply 1 application topically daily. 02/05/19   [provider]  tadalafil (CIALIS) 5 MG tablet Take 5 mg by mouth daily. 12/18/20   [provider]  tamsulosin (FLOMAX) 0.4 MG CAPS capsule Take 0.4 mg by mouth.    [provider]  Testosterone 30 MG/ACT SOLN APPLY 1-2 PUMPS DAILY. ALTERNATE TAKING ONE PUMP UNDER ONE ARM ONE DAY, TWO PUMPS THE NEXT DAY (ONE PUMP UNDER EACH ARM) 08/05/20   [name], DO  vitamin C (ASCORBIC ACID) 500 MG tablet Take 500 mg by mouth daily.    [provider]    Allergies    Methotrexate derivatives and Tramadol  Review of Systems   Review of Systems  Constitutional:  Negative for chills and fever.  HENT:  Negative for congestion and facial swelling.   Eyes:  Negative for discharge and visual disturbance.  Respiratory:  Negative for shortness of breath.   Cardiovascular:  Positive for  chest pain. Negative for palpitations.  Gastrointestinal:  Positive for abdominal pain. Negative for diarrhea and vomiting.  Musculoskeletal:  Negative for arthralgias and myalgias.  Skin:  Negative for color change and rash.  Neurological:  Negative for tremors, syncope and headaches.  Psychiatric/Behavioral:  Negative for confusion and dysphoric mood.  Physical Exam Updated Vital Signs BP (!) 153/86   Pulse 67   Temp 98.5 F (36.9 C) (Oral)   Resp 11   Ht 6' (1.829 m)   Wt 95.4 kg   SpO2 97%   BMI 28.52 kg/m   Physical Exam Vitals and nursing note reviewed.  Constitutional:      Appearance: He is well-developed.  HENT:     Head: Normocephalic and atraumatic.  Eyes:     Pupils: Pupils are equal, round, and reactive to light.  Neck:     Vascular: No JVD.  Cardiovascular:     Rate and Rhythm: Normal rate and regular rhythm.     Heart sounds: No murmur heard.   No friction rub. No gallop.  Pulmonary:     Effort: No respiratory distress.     Breath sounds: No wheezing.  Abdominal:     General: There is no distension.     Tenderness: There is no abdominal tenderness. There is no guarding or rebound.     Comments: No abdominal tenderness on exam, no pain in the right upper quadrant negative Murphy's.  Musculoskeletal:        General: Normal range of motion.     Cervical back: Normal range of motion and neck supple.  Skin:    Coloration: Skin is not pale.     Findings: No rash.  Neurological:     Mental Status: He is alert and oriented to person, place, and time.  Psychiatric:        Behavior: Behavior normal.    ED Results / Procedures / Treatments   Labs (all labs ordered are listed, but only abnormal results are displayed) Labs Reviewed  COMPREHENSIVE METABOLIC PANEL - Abnormal; Notable for the following components:      Result Value   Glucose, Bld 141 (*)    Calcium 8.8 (*)    All other components within normal limits  CBC WITH DIFFERENTIAL/PLATELET   LIPASE, BLOOD  BRAIN NATRIURETIC PEPTIDE  D-DIMER, QUANTITATIVE  TROPONIN I (HIGH SENSITIVITY)  TROPONIN I (HIGH SENSITIVITY)    EKG EKG Interpretation  Date/Time:  Tuesday December 30 2020 08:41:20 EDT Ventricular Rate:  66 PR Interval:  213 QRS Duration: 136 QT Interval:  438 QTC Calculation: 459 R Axis:   94 Text Interpretation: Sinus rhythm Borderline prolonged PR interval Right bundle branch block Since last tracing rate slower Otherwise no significant change Confirmed by Deno Etienne [phone]) on 12/30/2020 9:04:13 AM  Radiology DG Chest Port 1 View  Result Date: 12/30/2020 CLINICAL DATA:  Chest pain EXAM: PORTABLE CHEST 1 VIEW COMPARISON:  10/22/2017 FINDINGS: Heart and mediastinal contours are within normal limits. No focal opacities or effusions. No acute bony abnormality. IMPRESSION: No active disease. Electronically Signed   By: Rolm Baptise M.D.   On: 12/30/2020 09:34    Procedures Procedures   Medications Ordered in ED Medications  aspirin chewable tablet 324 mg (324 mg Oral Given 12/30/20 0917)  alum & mag hydroxide-simeth (MAALOX/MYLANTA) 200-200-20 MG/5ML suspension 30 mL (30 mLs Oral Given 12/30/20 0916)  nitroGLYCERIN (NITROSTAT) SL tablet 0.4 mg (0.4 mg Sublingual Given 12/30/20 0949)  albuterol (VENTOLIN HFA) 108 (90 Base) MCG/ACT inhaler 2 puff (2 puffs Inhalation Given 12/30/20 6045)    ED Course  I have reviewed the triage vital signs and the nursing notes.  Pertinent labs & imaging results that were available during my care of the patient were reviewed by me and considered in my medical decision making (see chart for  details).    MDM Rules/Calculators/A&P                           70 yo M with a cc of chest pain.  Patient symptoms are somewhat typical though not exertional.  Chest x-ray viewed by me without focal infiltrate D-dimer obtained due to history of testosterone use and active smoker negative.  Troponin negative.  LFTs unremarkable lipase  negative.  Patient symptoms have improved somewhat though he thinks is much better with the nitro and does not feel like the Maalox improved his symptoms at all.  We will obtain a second troponin.  Delta is negative.  Patient continuing to improve.  Discussed inpatient versus outpatient follow-up and the patient currently electing for the latter.  Encouraged to return anytime he realizes symptoms are exertional he is currently on Nexium, will continue to have him take that but we will have him watch what he eats and drinks.  11:59 AM:  I have discussed the diagnosis/risks/treatment options with the patient and believe the pt to be eligible for discharge home to follow-up with PCP. We also discussed returning to the ED immediately if new or worsening sx occur. We discussed the sx which are most concerning (e.g., sudden worsening pain, fever, inability to tolerate by mouth, exertional symptoms, sob) that necessitate immediate return. Medications administered to the patient during their visit and any new prescriptions provided to the patient are listed below.  Medications given during this visit Medications  aspirin chewable tablet 324 mg (324 mg Oral Given 12/30/20 0917)  alum & mag hydroxide-simeth (MAALOX/MYLANTA) 200-200-20 MG/5ML suspension 30 mL (30 mLs Oral Given 12/30/20 0916)  nitroGLYCERIN (NITROSTAT) SL tablet 0.4 mg (0.4 mg Sublingual Given 12/30/20 0949)  albuterol (VENTOLIN HFA) 108 (90 Base) MCG/ACT inhaler 2 puff (2 puffs Inhalation Given 12/30/20 8786)     The patient appears reasonably screen and/or stabilized for discharge and I doubt any other medical condition or other [facility] requiring further screening, evaluation, or treatment in the ED at this time prior to discharge.   Final Clinical Impression(s) / ED Diagnoses Final diagnoses:  Nonspecific chest pain    Rx / DC Orders ED Discharge Orders     None        Deno Etienne, DO 12/30/20 1159

## 2020-12-30 NOTE — ED Triage Notes (Addendum)
Pt woke up around midnight with some abdominal pain and epigastric area.  Pt states it is a burning sensation.  Took pepto-bismol without results.  Diffucult with getting a good breath but no sob.  No N/V.  No diaphoresis.  Pt states he feels like his abdomen is tight.  No recent weight gain.

## 2020-12-30 NOTE — Telephone Encounter (Signed)
Medication: insulin glargine (LANTUS SOLOSTAR) 100 UNIT/ML Solostar Pen **to move from a preferred brand tier 3 co-payment to a lower tier** Prior authorization determination received Lower medication cost has been denied Reason for denial:  "Lantus SoloStar has not met Tier Exception criteria requirements. Tier Exceptions require a total of at least three alternative formulary medications approved to treat the same condition that are on a lower tier (including generic equivalent, if available). In this case, Lantus SoloStar is a brand-name drug and does not have any comparable alternative brand-name formulary medications on a lower tier, and thus does not meet Tier Exception approval criteria"

## 2020-12-30 NOTE — Telephone Encounter (Signed)
Will they cover Toujeo?

## 2020-12-30 NOTE — Discharge Instructions (Signed)
Try to avoid things that may make this worse, most commonly these are spicy foods tomato based products fatty foods chocolate and peppermint.  Alcohol and tobacco can also make this worse.  Return to the emergency department for sudden worsening pain fever or inability to eat or drink.

## 2021-01-13 NOTE — Telephone Encounter (Signed)
Spoke with Frederick Guzman at [facility] and she said that Frederick Guzman is covered as long as it does not go over 2/day

## 2021-01-14 ENCOUNTER — Other Ambulatory Visit: Payer: Self-pay | Admitting: Family Medicine

## 2021-01-14 ENCOUNTER — Encounter: Payer: Self-pay | Admitting: Family Medicine

## 2021-01-14 DIAGNOSIS — E1165 Type 2 diabetes mellitus with hyperglycemia: Secondary | ICD-10-CM

## 2021-01-14 MED ORDER — TOUJEO MAX SOLOSTAR 300 UNIT/ML ~~LOC~~ SOPN: 60.0000 [IU] | PEN_INJECTOR | Freq: Every day | SUBCUTANEOUS | 1 refills | Status: DC

## 2021-01-14 NOTE — Telephone Encounter (Signed)
Rx updated to toujeo.

## 2021-01-15 DIAGNOSIS — Z23 Encounter for immunization: Secondary | ICD-10-CM | POA: Diagnosis not present

## 2021-01-21 DIAGNOSIS — Z20828 Contact with and (suspected) exposure to other viral communicable diseases: Secondary | ICD-10-CM | POA: Diagnosis not present

## 2021-01-21 NOTE — Telephone Encounter (Signed)
Patient aware of Rx change. No further questions or concerns at this time.

## 2021-01-26 ENCOUNTER — Encounter (HOSPITAL_BASED_OUTPATIENT_CLINIC_OR_DEPARTMENT_OTHER): Payer: Self-pay | Admitting: *Deleted

## 2021-01-26 ENCOUNTER — Other Ambulatory Visit: Payer: Self-pay

## 2021-01-26 ENCOUNTER — Emergency Department (HOSPITAL_BASED_OUTPATIENT_CLINIC_OR_DEPARTMENT_OTHER): Payer: Medicare Other

## 2021-01-26 ENCOUNTER — Emergency Department (HOSPITAL_BASED_OUTPATIENT_CLINIC_OR_DEPARTMENT_OTHER)
Admission: EM | Admit: 2021-01-26 | Discharge: 2021-01-26 | Disposition: A | Discharge status: Home / Self Care | Payer: Medicare Other | Attending: Emergency Medicine | Admitting: Emergency Medicine

## 2021-01-26 DIAGNOSIS — J45909 Unspecified asthma, uncomplicated: Secondary | ICD-10-CM | POA: Insufficient documentation

## 2021-01-26 DIAGNOSIS — I10 Essential (primary) hypertension: Secondary | ICD-10-CM | POA: Insufficient documentation

## 2021-01-26 DIAGNOSIS — Z79899 Other long term (current) drug therapy: Secondary | ICD-10-CM | POA: Insufficient documentation

## 2021-01-26 DIAGNOSIS — F17210 Nicotine dependence, cigarettes, uncomplicated: Secondary | ICD-10-CM | POA: Diagnosis not present

## 2021-01-26 DIAGNOSIS — J449 Chronic obstructive pulmonary disease, unspecified: Secondary | ICD-10-CM | POA: Diagnosis not present

## 2021-01-26 DIAGNOSIS — L03311 Cellulitis of abdominal wall: Secondary | ICD-10-CM | POA: Diagnosis not present

## 2021-01-26 DIAGNOSIS — Z96612 Presence of left artificial shoulder joint: Secondary | ICD-10-CM | POA: Insufficient documentation

## 2021-01-26 DIAGNOSIS — E119 Type 2 diabetes mellitus without complications: Secondary | ICD-10-CM | POA: Diagnosis not present

## 2021-01-26 DIAGNOSIS — K429 Umbilical hernia without obstruction or gangrene: Secondary | ICD-10-CM | POA: Insufficient documentation

## 2021-01-26 DIAGNOSIS — Z794 Long term (current) use of insulin: Secondary | ICD-10-CM | POA: Insufficient documentation

## 2021-01-26 DIAGNOSIS — Z7951 Long term (current) use of inhaled steroids: Secondary | ICD-10-CM | POA: Diagnosis not present

## 2021-01-26 DIAGNOSIS — Z85828 Personal history of other malignant neoplasm of skin: Secondary | ICD-10-CM | POA: Insufficient documentation

## 2021-01-26 DIAGNOSIS — R109 Unspecified abdominal pain: Secondary | ICD-10-CM | POA: Diagnosis not present

## 2021-01-26 LAB — LACTIC ACID, PLASMA: Lactic Acid, Venous: 1.1 mmol/L (ref 0.5–1.9)

## 2021-01-26 LAB — COMPREHENSIVE METABOLIC PANEL
ALT: 35 U/L (ref 0–44)
AST: 27 U/L (ref 15–41)
Albumin: 3.8 g/dL (ref 3.5–5.0)
Alkaline Phosphatase: 110 U/L (ref 38–126)
Anion gap: 8 (ref 5–15)
BUN: 11 mg/dL (ref 8–23)
CO2: 29 mmol/L (ref 22–32)
Calcium: 8.8 mg/dL — ABNORMAL LOW (ref 8.9–10.3)
Chloride: 99 mmol/L (ref 98–111)
Creatinine, Ser: 1.19 mg/dL (ref 0.61–1.24)
GFR, Estimated: >60 mL/min (ref >60)
Glucose, Bld: 123 mg/dL — ABNORMAL HIGH (ref 70–99)
Potassium: 3.7 mmol/L (ref 3.5–5.1)
Sodium: 136 mmol/L (ref 135–145)
Total Bilirubin: 0.5 mg/dL (ref 0.3–1.2)
Total Protein: 7.2 g/dL (ref 6.5–8.1)

## 2021-01-26 LAB — CBC WITH DIFFERENTIAL/PLATELET
Abs Immature Granulocytes: 0.05 10*3/uL (ref 0.00–0.07)
Basophils Absolute: 0 10*3/uL (ref 0.0–0.1)
Basophils Relative: 0 %
Eosinophils Absolute: 0.1 10*3/uL (ref 0.0–0.5)
Eosinophils Relative: 1 %
HCT: 46.4 % (ref 39.0–52.0)
Hemoglobin: 16.1 g/dL (ref 13.0–17.0)
Immature Granulocytes: 0 %
Lymphocytes Relative: 21 %
Lymphs Abs: 2.4 10*3/uL (ref 0.7–4.0)
MCH: 31.9 pg (ref 26.0–34.0)
MCHC: 34.7 g/dL (ref 30.0–36.0)
MCV: 92.1 fL (ref 80.0–100.0)
Monocytes Absolute: 0.5 10*3/uL (ref 0.1–1.0)
Monocytes Relative: 5 %
Neutro Abs: 8.1 10*3/uL — ABNORMAL HIGH (ref 1.7–7.7)
Neutrophils Relative %: 73 %
Platelets: 229 10*3/uL (ref 150–400)
RBC: 5.04 MIL/uL (ref 4.22–5.81)
RDW: 13 % (ref 11.5–15.5)
WBC: 11.2 10*3/uL — ABNORMAL HIGH (ref 4.0–10.5)
nRBC: 0 % (ref 0.0–0.2)

## 2021-01-26 LAB — LIPASE, BLOOD: Lipase: 20 U/L (ref 11–51)

## 2021-01-26 IMAGING — CT CT ABD-PELV W/ CM
2 of 5 series · 16 of 46 positions shown, 18 images · IV contrast (Omnipaque)
Comparison: [DATE]

CLINICAL DATA: Abdominal pain x3 days, palpable abnormality with
redness near umbilicus

EXAM:
CT ABDOMEN AND PELVIS WITH CONTRAST
TECHNIQUE: Multidetector CT imaging of the abdomen and pelvis was performed
using the standard protocol following bolus administration of
intravenous contrast.
CONTRAST:  100mL OMNIPAQUE IOHEXOL 300 MG/ML  SOLN

[Series 2: axial st · axial · 0.87mm/px · z∈[-740,-280]mm · 13 of 102 slices shown, 15 images]
[im 5/102  soft-tissue]
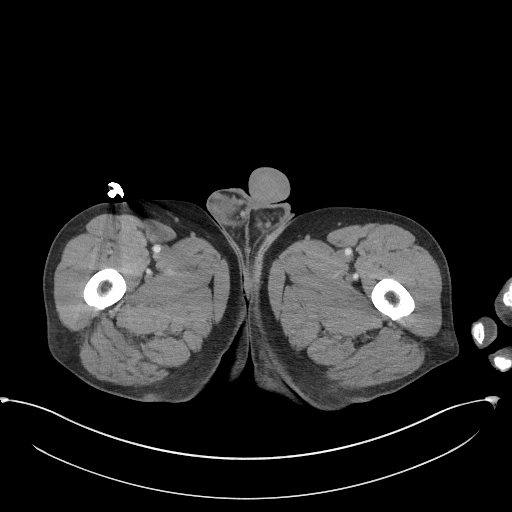
[im 5/102  bone]
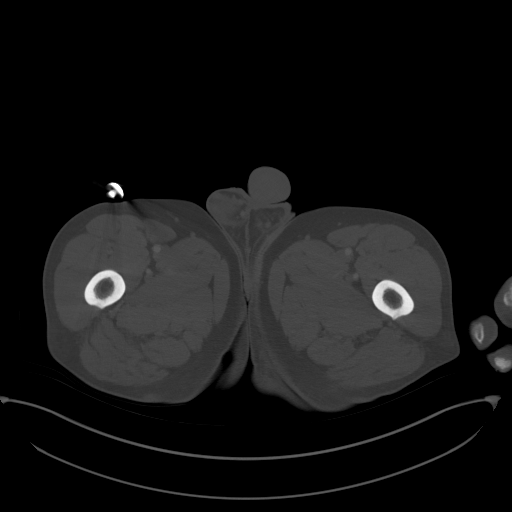
[im 15/102  soft-tissue]
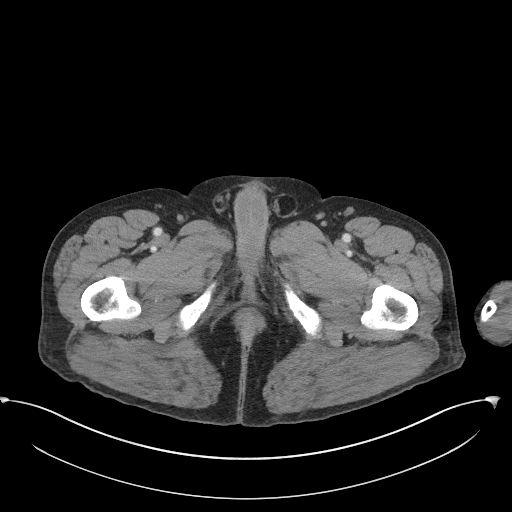
[im 20/102  soft-tissue]
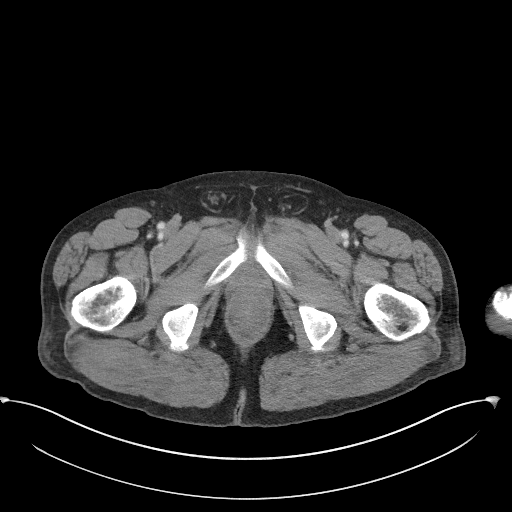
[im 29/102  soft-tissue]
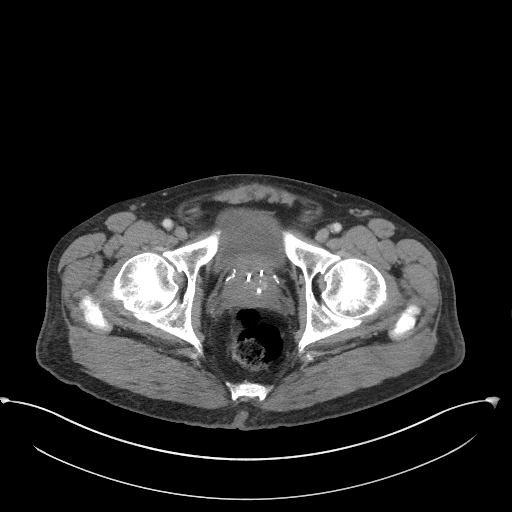
[im 34/102  soft-tissue]
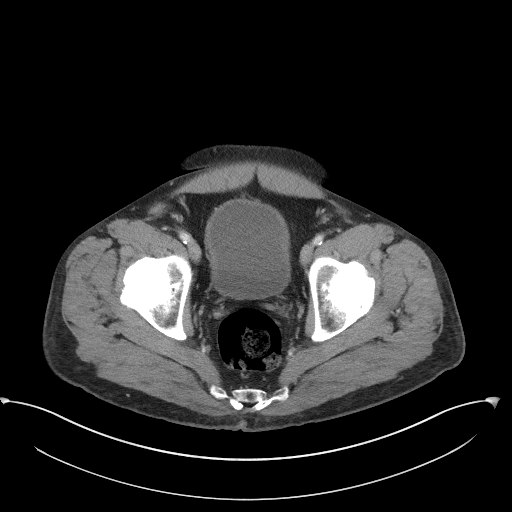
[im 44/102  soft-tissue]
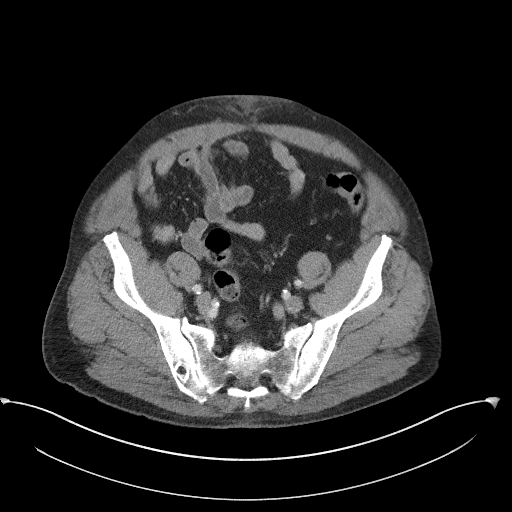
[im 53/102  soft-tissue]
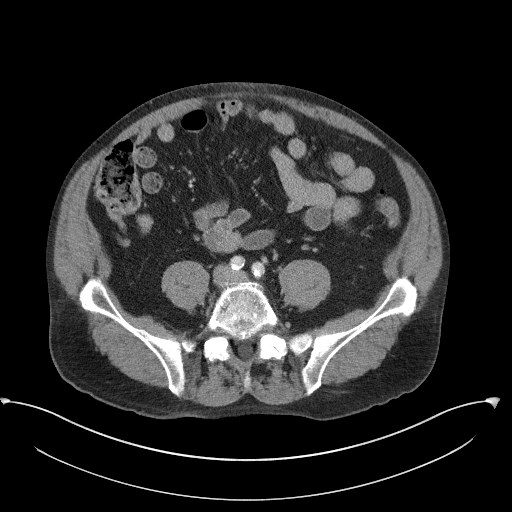
[im 58/102  soft-tissue]
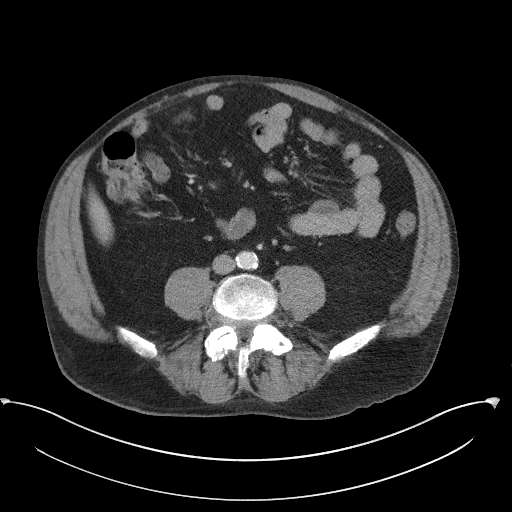
[im 68/102  soft-tissue]
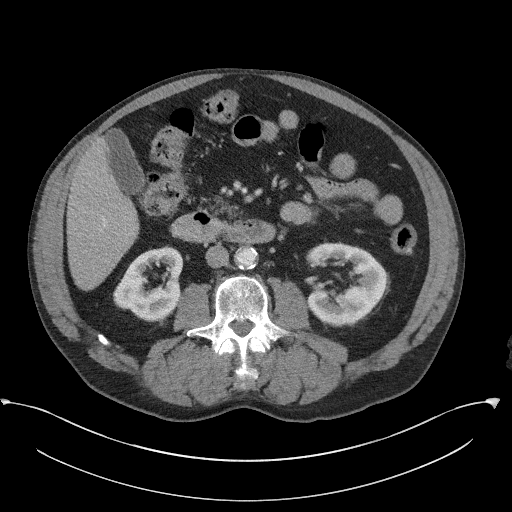
[im 68/102  bone]
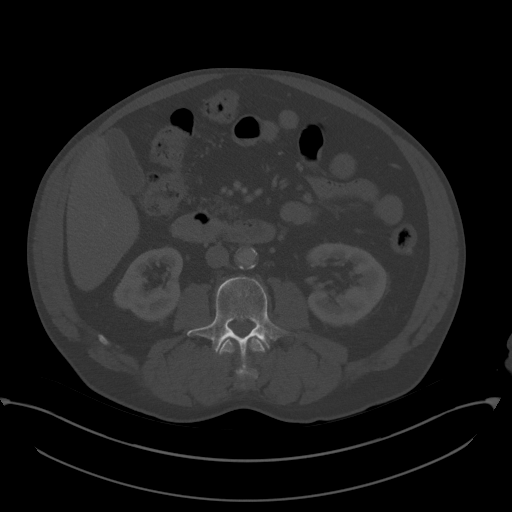
[im 73/102  soft-tissue]
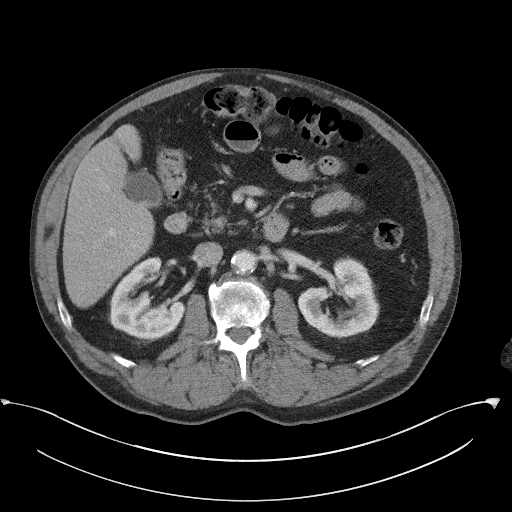
[im 82/102  soft-tissue]
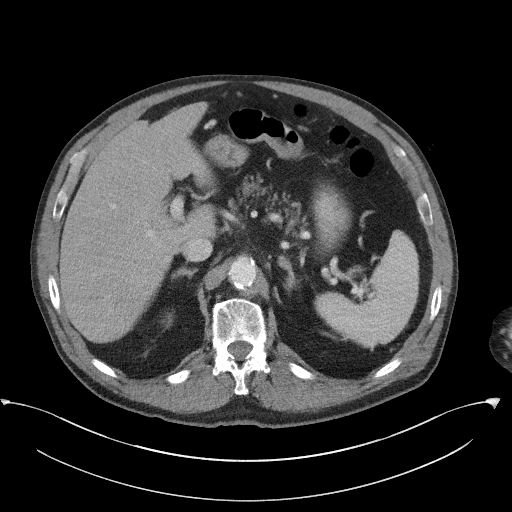
[im 87/102  soft-tissue]
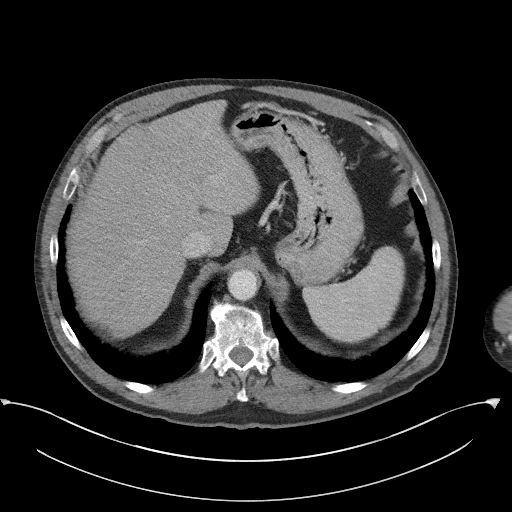
[im 97/102  soft-tissue]
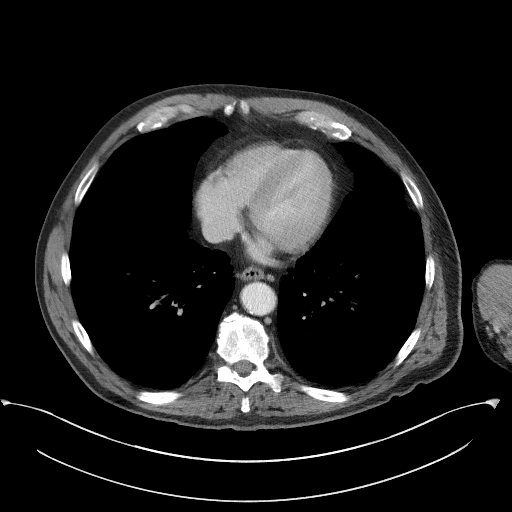

[Series 9: coronal st · coronal · 0.77mm/px · 3 of 106 slices shown]
[im 36/106  soft-tissue]
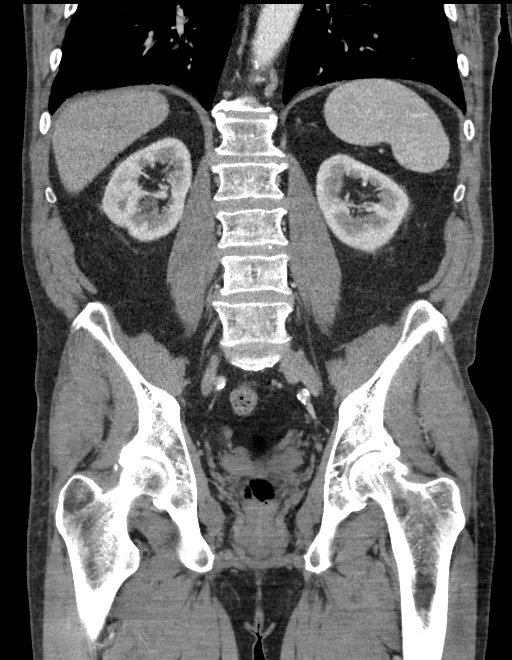
[im 47/106  soft-tissue]
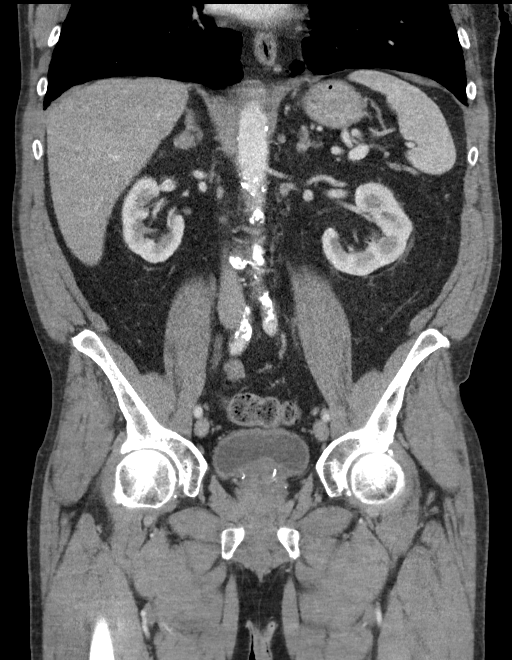
[im 59/106  soft-tissue]
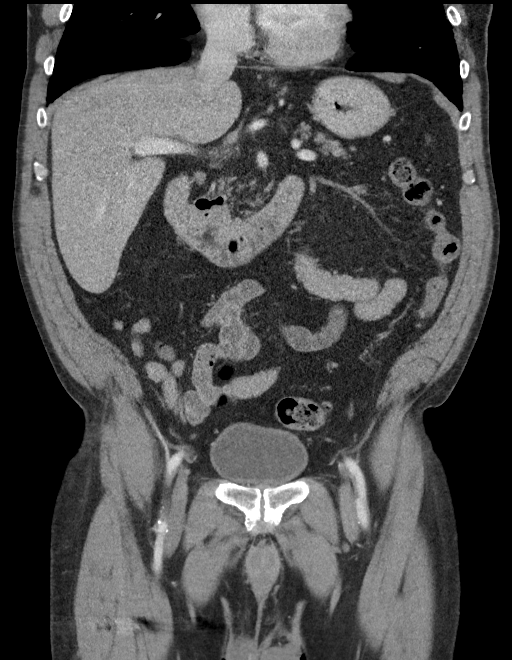

[16 of 46 positions shown; findings below may reference images not displayed]

FINDINGS: Lower chest: Emphysematous changes of the lung bases.

Hepatobiliary: Liver is within normal limits.

Gallbladder is unremarkable. No intrahepatic or extrahepatic ductal
dilatation.

Pancreas: Within normal limits.

Spleen: Within normal limits.

Adrenals/Urinary Tract: Adrenal glands are within normal limits.

Kidneys are within normal limits.  No hydronephrosis.

Mildly thick-walled bladder.

Stomach/Bowel: Stomach is within normal limits.

No evidence of bowel obstruction.

Normal appendix (series 2/image 50).

Mild sigmoid diverticulosis, without evidence of diverticulitis.

Vascular/Lymphatic: No evidence of abdominal aortic aneurysm.

Atherosclerotic calcifications of the abdominal aorta and branch
vessels.

No suspicious abdominopelvic lymphadenopathy.

Reproductive: Prostate is notable for postprocedural changes related
to urolift procedure.

Other: No abdominopelvic ascites.

Small fat containing periumbilical hernia with adjacent subcutaneous
stranding (series 2/image 56). Appearance favors adjacent cellulitis
over an incarcerated hernia (sagittal image 65). Regardless,
correlate for reducibility.

No drainable fluid collection/abscess.

Musculoskeletal: Degenerative changes of the lower thoracic spine.
IMPRESSION: Small fat containing periumbilical hernia with adjacent subcutaneous
stranding, favoring adjacent cellulitis over incarcerated hernia.
Correlate for reducibility.

## 2021-01-26 MED ORDER — CLINDAMYCIN HCL 300 MG PO CAPS: 300.0000 mg | ORAL_CAPSULE | Freq: Three times a day (TID) | ORAL | 0 refills | Status: DC

## 2021-01-26 MED ORDER — PIPERACILLIN-TAZOBACTAM 3.375 G IVPB 30 MIN
3.3750 g | Freq: Once | INTRAVENOUS | Status: AC
  Administered 2021-01-26: 3.375 g via INTRAVENOUS
  Filled 2021-01-26: qty 50

## 2021-01-26 MED ORDER — ONDANSETRON HCL 4 MG/2ML IJ SOLN
4.0000 mg | Freq: Once | INTRAMUSCULAR | Status: AC
  Administered 2021-01-26: 4 mg via INTRAVENOUS
  Filled 2021-01-26: qty 2

## 2021-01-26 MED ORDER — IOHEXOL 300 MG/ML  SOLN
100.0000 mL | Freq: Once | INTRAMUSCULAR | Status: AC | PRN
  Administered 2021-01-26: 100 mL via INTRAVENOUS

## 2021-01-26 MED ORDER — FENTANYL CITRATE PF 50 MCG/ML IJ SOSY
50.0000 ug | PREFILLED_SYRINGE | Freq: Once | INTRAMUSCULAR | Status: AC
Start: 2021-01-26 — End: 2021-01-26
  Administered 2021-01-26: 50 ug via INTRAVENOUS
  Filled 2021-01-26: qty 1

## 2021-01-26 NOTE — ED Provider Notes (Addendum)
[facility] Provider Note   CSN: [id number] Arrival date & time: 01/26/21  1752     History Chief Complaint  Patient presents with   Abdominal Pain    Abcess ?    JONOTHAN HEBERLE is a 70 y.o. male history of COPD, diabetes, previous diverticulitis here presenting with abdominal pain.  Patient states that he has some tenderness around his umbilicus 3 days ago.  He states that he noticed some redness and swelling since yesterday.  Patient states that it is very tender when he touches it.  Denies any fevers or chills.  He states that he always has small umbilical hernia.  He denies any fever or vomiting.  He still passing gas  The history is provided by the patient.      Past Medical History:  Diagnosis Date   Allergy    Arthritis    OA   Asthma    Cancer ([place]) 08/2014   melanoma on R shoulder & back - [facility]    COPD (chronic obstructive pulmonary disease) ([place])    Depression    pt. denies- 01/2016, pt on Wellbutrin for smoking cessation   Diabetes mellitus without complication ([place])    Diverticulitis    Dyspnea    with exertion   Fibromyalgia    GERD (gastroesophageal reflux disease)    Hyperlipemia    Hypertension    "never was high"   Lymphocytic colitis 04/2018   Pneumonia 06/2015, 10/2016   Sleep apnea    last study - Nov. 2017, waiting for a new machine, using the old CPAP q night after cleaning the device after he was documented to have pneumonia from his CPAP machine.     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   Combined forms of age-related cataract of right eye 09/21/2018   Testosterone deficiency in male 04/10/2018   Pulmonary nodules 10/23/2017   Sepsis, unspecified organism ([place]) 10/23/2017   Pain in right hand 08/04/2017   Long term use of drug 04/19/2017   Pain in joint of left shoulder 04/19/2017   Anxious  depression 10/19/2016   Positive depression screening 09/21/2016   Uncontrolled type 2 diabetes mellitus with hyperglycemia, without long-term current use of insulin ([place]) 09/21/2016   Type 2 diabetes mellitus without complications ([employer]) 28/36/6294   Status post shoulder surgery 08/09/2016   Prosthetic shoulder infection, subsequent encounter 08/09/2016   Fibromyalgia 05/21/2016   Infection and inflammatory reaction due to internal orthopedic device, implant, and graft ([place]) 03/31/2016   S/P shoulder replacement, left 01/30/2016   HCAP (healthcare-associated pneumonia) 07/13/2015   Depression    HTN (hypertension)    Acute asthma    Hypertrophy of prostate without urinary obstruction and other lower urinary tract symptoms (LUTS) 06/17/2011   Hypogonadism male 06/17/2011   Arthritis of wrist 05/28/2011   MDD (major depressive disorder) ([place]) 04/30/2011   GERD (gastroesophageal reflux disease) 11/23/2010   DERMATITIS, ATOPIC 11/23/2010   Chest pain 10/07/2010   COPD (chronic obstructive pulmonary disease) ([place]) 10/07/2010   TOBACCO ABUSE 03/10/2007   OBSTRUCTIVE SLEEP APNEA 03/10/2007   BRONCHITIS, CHRONIC 03/10/2007   ASTHMA, MODERATE 03/10/2007    Past Surgical History:  Procedure Laterality Date   carpel tunnel Bilateral    COLONOSCOPY W/ POLYPECTOMY     COLONOSCOPY W/ POLYPECTOMY     EXCISIONAL TOTAL SHOULDER ARTHROPLASTY WITH ANTIBIOTIC SPACER Left 08/09/2016   Procedure: LEFT SHOULDER  HARDWARE REMOVAL, IRRIGATION AND DEBRIDEMENT, PLACEMENT OF ANTIBIOTIC SPACER;  Surgeon: Netta Cedars, MD;  Location: [place];  Service: Orthopedics;  Laterality: Left;   IRRIGATION AND DEBRIDEMENT SHOULDER Left 03/31/2016   Procedure: IRRIGATION AND DEBRIDEMENT SHOULDER, deep cultures and polyethylene exchange;  Surgeon: Netta Cedars, MD;  Location: [place];  Service: Orthopedics;  Laterality: Left;  requests 84mins   REVERSE SHOULDER ARTHROPLASTY Left 01/30/2016   Procedure: LEFT REVERSE SHOULDER  ARTHROPLASTY;  Surgeon: Netta Cedars, MD;  Location: [place];  Service: Orthopedics;  Laterality: Left;   ROTATOR CUFF REPAIR Left    SHOULDER ARTHROSCOPY WITH DISTAL CLAVICLE RESECTION Right 01/30/2018   Procedure: RIGHT SHOULDER ARTHROSCOPY WITH OPEN DISTAL CLAVICLE RESECTION, SUBACROMIAL DECOMPRESSION;  Surgeon: Netta Cedars, MD;  Location: [place];  Service: Orthopedics;  Laterality: Right;   STERIOD INJECTION Right 01/30/2018   Procedure: STEROID INJECTION;  Surgeon: Netta Cedars, MD;  Location: [place];  Service: Orthopedics;  Laterality: Right;   TOTAL SHOULDER REVISION Left 01/17/2017   Procedure: LEFT SHOULDER REVISION REVERSE TOTAL SHOULDER ARTHROPLASTY;  Surgeon: Netta Cedars, MD;  Location: [place];  Service: Orthopedics;  Laterality: Left;   WRIST SURGERY Right 11/2014   Dr. Amedeo Plenty       Family History  Problem Relation Age of Onset   Diabetes Mother    Colon cancer Father    Prostate cancer Father    Diabetes Father    Esophageal cancer Neg Hx    Rectal cancer Neg Hx    Stomach cancer Neg Hx     Social History   Tobacco Use   Smoking status: Every Day    Packs/day: 0.75    Years: 43.00    Pack years: 32.25    Types: Cigarettes   Smokeless tobacco: Never   Tobacco comments:    pt no longer taking chantix  Vaping Use   Vaping Use: Never used  Substance Use Topics   Alcohol use: Yes    Alcohol/week: 9.0 standard drinks    Types: 6 Cans of beer, 3 Shots of liquor per week    Comment: beer or mixed drink    Drug use: No    Home Medications Prior to Admission medications   Medication Sig Start Date End Date Taking? Authorizing Provider  albuterol (PROVENTIL) (2.5 MG/3ML) 0.083% nebulizer solution Take 2.5 mg by nebulization every 6 (six) hours as needed for wheezing or shortness of breath.    [provider]  albuterol (VENTOLIN HFA) 108 (90 Base) MCG/ACT inhaler Inhale 2 puffs into the lungs every 6 (six) hours as needed for wheezing or shortness of  breath.    [provider]  atorvastatin (LIPITOR) 20 MG tablet Take 1 tablet (20 mg total) by mouth every other day. 02/21/20   [name], DO  b complex vitamins tablet Take 1 tablet by mouth daily.    [provider]  Budeson-Glycopyrrol-Formoterol (BREZTRI AEROSPHERE) 160-9-4.8 MCG/ACT AERO Inhale into the lungs. 11/14/19   [provider]  buPROPion (WELLBUTRIN XL) 300 MG 24 hr tablet Take 1 tablet (300 mg total) by mouth daily. 11/20/20   [name], DO  cetirizine (ZYRTEC) 10 MG tablet Take 10 mg by mouth daily as needed for allergies.     [provider]  DULoxetine (CYMBALTA) 20 MG capsule Take 2 capsules (40 mg total) by mouth daily for 7 days, THEN 1 capsule (20 mg total) daily. 11/26/20 02/24/21  [name], DO  esomeprazole (NEXIUM) 40 MG capsule Take 1 capsule (40 mg total)  by mouth daily. 09/01/20   [name], DO  fluticasone [facility]) 50 MCG/ACT nasal spray Place 1 spray into both nostrils daily as needed for allergies. 12/30/15   [provider]  furosemide (LASIX) 20 MG tablet Take 1 tablet (20 mg total) by mouth 2 (two) times daily as needed for edema. 10/18/18   [name], DO  glucose blood (PRODIGY NO CODING BLOOD GLUC) test strip Use up to 4 times per day as directed with glucometer. 09/17/20   [name], DO  insulin glargine, 2 Unit Dial, (TOUJEO MAX SOLOSTAR) 300 UNIT/ML Solostar Pen Inject 60 Units into the skin at bedtime. 01/14/21 04/14/21  Luetta Nutting, DO  Insulin Pen Needle 32G X 4 MM MISC For use with Lantus Solostar Patient taking differently: For use with Lantus Solostar. 22 units of insulin 09/15/20   Trixie Dredge, PA-C  lisinopril (ZESTRIL) 2.5 MG tablet Take 1 tablet (2.5 mg total) by mouth at bedtime. 08/21/20   [name], DO  Multiple Vitamin (MULTIVITAMIN WITH MINERALS) TABS tablet Take 1 tablet by mouth at bedtime.    [provider]  Omega-3  Fatty Acids (FISH OIL) 1000 MG CAPS Take 1,000 mg by mouth at bedtime.     [provider]  Polyvinyl Alcohol-Povidone (REFRESH OP) Place 1 drop into both eyes daily as needed (dry eyes).    [provider]  tacrolimus (PROTOPIC) 0.1 % ointment Apply 1 application topically daily. 02/05/19   [provider]  tadalafil (CIALIS) 5 MG tablet Take 5 mg by mouth daily. 12/18/20   [provider]  tamsulosin (FLOMAX) 0.4 MG CAPS capsule Take 0.4 mg by mouth.    [provider]  Testosterone 30 MG/ACT SOLN APPLY 1-2 PUMPS DAILY. ALTERNATE TAKING ONE PUMP UNDER ONE ARM ONE DAY, TWO PUMPS THE NEXT DAY (ONE PUMP UNDER EACH ARM) 08/05/20   [name], DO  vitamin C (ASCORBIC ACID) 500 MG tablet Take 500 mg by mouth daily.    [provider]    Allergies    Methotrexate derivatives and Tramadol  Review of Systems   Review of Systems  Gastrointestinal:  Positive for abdominal pain.  All other systems reviewed and are negative.  Physical Exam Updated Vital Signs BP (!) 163/90 (BP Location: Right Arm)   Pulse 88   Temp 98.2 F (36.8 C) (Oral)   Resp 16   Ht 6\' 1"  (1.854 m)   Wt 90.7 kg   SpO2 96%   BMI 26.39 kg/m   Physical Exam Vitals and nursing note reviewed.  HENT:     Head: Normocephalic.     Mouth/Throat:     Mouth: Mucous membranes are moist.  Eyes:     Extraocular Movements: Extraocular movements intact.     Pupils: Pupils are equal, round, and reactive to light.  Cardiovascular:     Rate and Rhythm: Normal rate and regular rhythm.  Abdominal:     General: Abdomen is flat.     Comments: Tenderness over the umbilicus.  There is a small umbilical hernia with surrounding redness.  Difficult to reduce the hernia  Skin:    General: Skin is warm.     Capillary Refill: Capillary refill takes less than 2 seconds.  Neurological:     General: No focal deficit present.     Mental Status: He is alert and oriented to person,  place, and time.  Psychiatric:        Mood and Affect: Mood normal.  Behavior: Behavior normal.    ED Results / Procedures / Treatments   Labs (all labs ordered are listed, but only abnormal results are displayed) Labs Reviewed  CULTURE, BLOOD (ROUTINE X 2)  CULTURE, BLOOD (ROUTINE X 2)  CBC WITH DIFFERENTIAL/PLATELET  COMPREHENSIVE METABOLIC PANEL  LIPASE, BLOOD  LACTIC ACID, PLASMA  LACTIC ACID, PLASMA    EKG None  Radiology No results found.  Procedures Procedures   Medications Ordered in ED Medications  ondansetron (ZOFRAN) injection 4 mg (4 mg Intravenous Given 01/26/21 2020)  fentaNYL (SUBLIMAZE) injection 50 mcg (50 mcg Intravenous Given 01/26/21 2021)    ED Course  I have reviewed the triage vital signs and the nursing notes.  Pertinent labs & imaging results that were available during my care of the patient were reviewed by me and considered in my medical decision making (see chart for details).    MDM Rules/Calculators/A&P                           OTTIS VACHA is a 70 y.o. male here presenting with swelling and redness around the umbilical hernia.  Difficult to reduce the umbilical hernia.  Concern for possible incarcerated hernia versus focal cellulitis versus diverticulitis.  Plan to get CBC and CMP and lactate and cultures and CT abdomen pelvis.  11:11 PM Patient's white blood cell count is 11.  His lactate is normal. His CT showed small fat-containing periumbilical hernia with some fat stranding. This is favored to be cellulitis over incarcerated hernia.  Patient received pain medicine in the ED and was able to reduce it but it does pop right back out.  It is more soft now.  Clinically I do not think he has a incarcerated hernia.  I think he likely has periumbilical cellulitis.  I offered to prescribe some pain medicine but he refused.  He states that he will take antibiotics but not pain medicine and rather take Tylenol or Motrin for pain.   Will refer to surgery outpatient as he likely will need a mesh now that the hernia is larger and harder to reduce     Final Clinical Impression(s) / ED Diagnoses Final diagnoses:  None    Rx / DC Orders ED Discharge Orders     None        Drenda Freeze, MD 01/26/21 2311    Drenda Freeze, MD 01/26/21 2313

## 2021-01-26 NOTE — ED Notes (Signed)
Pt returned from CT scan.

## 2021-01-26 NOTE — Discharge Instructions (Signed)
Take clindamycin 3 times daily for a week  Take Tylenol or Motrin for pain.  Please contact surgery this week regarding appointment to discuss options for your umbilical hernia  Return to ER if you have severe abdominal pain, if the hernia does not come back in, worse redness, fever, vomiting, not passing gas.

## 2021-01-26 NOTE — ED Triage Notes (Signed)
C/o abd pain x 3 days, noticeable knot x 2 days,  redness around navel today.

## 2021-01-31 LAB — CULTURE, BLOOD (ROUTINE X 2)
Culture: NO GROWTH
Culture: NO GROWTH

## 2021-02-05 ENCOUNTER — Other Ambulatory Visit: Payer: Self-pay | Admitting: Osteopathic Medicine

## 2021-02-12 ENCOUNTER — Other Ambulatory Visit: Payer: Self-pay | Admitting: Osteopathic Medicine

## 2021-02-16 ENCOUNTER — Other Ambulatory Visit: Payer: Self-pay | Admitting: Osteopathic Medicine

## 2021-02-16 DIAGNOSIS — G4733 Obstructive sleep apnea (adult) (pediatric): Secondary | ICD-10-CM | POA: Diagnosis not present

## 2021-02-16 DIAGNOSIS — J449 Chronic obstructive pulmonary disease, unspecified: Secondary | ICD-10-CM | POA: Diagnosis not present

## 2021-02-16 DIAGNOSIS — R918 Other nonspecific abnormal finding of lung field: Secondary | ICD-10-CM | POA: Diagnosis not present

## 2021-02-16 DIAGNOSIS — F17210 Nicotine dependence, cigarettes, uncomplicated: Secondary | ICD-10-CM | POA: Diagnosis not present

## 2021-02-19 DIAGNOSIS — D224 Melanocytic nevi of scalp and neck: Secondary | ICD-10-CM | POA: Diagnosis not present

## 2021-02-19 DIAGNOSIS — L720 Epidermal cyst: Secondary | ICD-10-CM | POA: Diagnosis not present

## 2021-02-19 DIAGNOSIS — L821 Other seborrheic keratosis: Secondary | ICD-10-CM | POA: Diagnosis not present

## 2021-02-19 DIAGNOSIS — D225 Melanocytic nevi of trunk: Secondary | ICD-10-CM | POA: Diagnosis not present

## 2021-02-19 DIAGNOSIS — Z85820 Personal history of malignant melanoma of skin: Secondary | ICD-10-CM | POA: Diagnosis not present

## 2021-02-19 DIAGNOSIS — D2371 Other benign neoplasm of skin of right lower limb, including hip: Secondary | ICD-10-CM | POA: Diagnosis not present

## 2021-02-19 DIAGNOSIS — D0462 Carcinoma in situ of skin of left upper limb, including shoulder: Secondary | ICD-10-CM | POA: Diagnosis not present

## 2021-02-19 DIAGNOSIS — Z85828 Personal history of other malignant neoplasm of skin: Secondary | ICD-10-CM | POA: Diagnosis not present

## 2021-02-19 DIAGNOSIS — L570 Actinic keratosis: Secondary | ICD-10-CM | POA: Diagnosis not present

## 2021-02-19 DIAGNOSIS — L218 Other seborrheic dermatitis: Secondary | ICD-10-CM | POA: Diagnosis not present

## 2021-02-19 DIAGNOSIS — C44622 Squamous cell carcinoma of skin of right upper limb, including shoulder: Secondary | ICD-10-CM | POA: Diagnosis not present

## 2021-03-06 ENCOUNTER — Telehealth: Payer: Self-pay | Admitting: *Deleted

## 2021-03-06 NOTE — Telephone Encounter (Signed)
Pt notified of provider recommendations. 

## 2021-03-06 NOTE — Telephone Encounter (Signed)
Pt left vm stating that he has some white sores that appeared today at the corners of his mouth that are bothersome.  No new medications.  He was wondering if something could be called in for it. Please advise.

## 2021-03-08 ENCOUNTER — Other Ambulatory Visit: Payer: Self-pay | Admitting: Osteopathic Medicine

## 2021-03-11 ENCOUNTER — Other Ambulatory Visit: Payer: Self-pay | Admitting: Osteopathic Medicine

## 2021-03-31 ENCOUNTER — Other Ambulatory Visit: Payer: Self-pay

## 2021-03-31 ENCOUNTER — Ambulatory Visit (INDEPENDENT_AMBULATORY_CARE_PROVIDER_SITE_OTHER): Payer: Medicare Other | Admitting: Family Medicine

## 2021-03-31 ENCOUNTER — Encounter: Payer: Self-pay | Admitting: Family Medicine

## 2021-03-31 VITALS — BP 142/78 | HR 83 | Temp 97.5°F | Ht 73.0 in | Wt 205.0 lb

## 2021-03-31 DIAGNOSIS — E291 Testicular hypofunction: Secondary | ICD-10-CM | POA: Diagnosis not present

## 2021-03-31 DIAGNOSIS — E782 Mixed hyperlipidemia: Secondary | ICD-10-CM

## 2021-03-31 DIAGNOSIS — F3341 Major depressive disorder, recurrent, in partial remission: Secondary | ICD-10-CM | POA: Diagnosis not present

## 2021-03-31 DIAGNOSIS — F17200 Nicotine dependence, unspecified, uncomplicated: Secondary | ICD-10-CM

## 2021-03-31 DIAGNOSIS — N400 Enlarged prostate without lower urinary tract symptoms: Secondary | ICD-10-CM

## 2021-03-31 DIAGNOSIS — I10 Essential (primary) hypertension: Secondary | ICD-10-CM | POA: Diagnosis not present

## 2021-03-31 DIAGNOSIS — E1165 Type 2 diabetes mellitus with hyperglycemia: Secondary | ICD-10-CM | POA: Diagnosis not present

## 2021-03-31 DIAGNOSIS — Z79899 Other long term (current) drug therapy: Secondary | ICD-10-CM

## 2021-03-31 DIAGNOSIS — R351 Nocturia: Secondary | ICD-10-CM | POA: Diagnosis not present

## 2021-03-31 NOTE — Assessment & Plan Note (Signed)
Doing well with current dose of testosterone.  Update testosterone levels, PSA and CBC.

## 2021-03-31 NOTE — Assessment & Plan Note (Signed)
Checking A1c today.  He will continue current medications for now and will adjust insulin as needed based on lab results.  He will continue to monitor glucose at home.

## 2021-03-31 NOTE — Assessment & Plan Note (Signed)
Continues to do well with current dose of bupropion.  We will continue at current strength.

## 2021-03-31 NOTE — Assessment & Plan Note (Signed)
Blood pressure is mildly elevated today.  We will continue current medications and encouraged to follow low-sodium diet.  Also encouraged to quit smoking.

## 2021-03-31 NOTE — Progress Notes (Signed)
Frederick Guzman - 71 y.o. male MRN [id number]  Date of birth: 06-Mar-1951  Subjective No chief complaint on file.   HPI Frederick Guzman is a 71 year old male here today for follow-up visit.  He is transferring care from Dr. Sheppard Coil.  He has history of hypertension, type 2 diabetes, COPD, low testosterone and history of depression.  He reports that overall he is doing well.  He denies new concerns at this time.  He is due for updated labs today.  Does report that his blood sugars have been running a little higher in the morning recently.  Admits that on occasion he he does have some lower blood sugars in the afternoon due to not eating throughout the day.  Lowest number he has seen has been in the upper 70s.  Blood pressure has been fairly well controlled with current medications.  He does continue to smoke but has been able to cut back some.  He denies any symptoms related to hypertension including chest pain, shortness of breath, palpitations, headaches or vision changes.  ROS:  A comprehensive ROS was completed and negative except as noted per HPI   Allergies  Allergen Reactions   Methotrexate Derivatives Nausea And Vomiting and Other (See Comments)    Hallucinations and aggressive behavior     Tramadol Nausea And Vomiting    Hallucinations and aggressive behavior     Past Medical History:  Diagnosis Date   Allergy    Arthritis    OA   Asthma    Cancer ([place]) 08/2014   melanoma on R shoulder & back - [facility]    COPD (chronic obstructive pulmonary disease) ([place])    Depression    pt. denies- 01/2016, pt on Wellbutrin for smoking cessation   Diabetes mellitus without complication ([place])    Diverticulitis    Dyspnea    with exertion   Fibromyalgia    GERD (gastroesophageal reflux disease)    Hyperlipemia    Hypertension    "never was high"   Lymphocytic colitis 04/2018   Pneumonia 06/2015, 10/2016   Sleep apnea    last study - Nov. 2017, waiting for a new machine, using the old  CPAP q night after cleaning the device after he was documented to have pneumonia from his CPAP machine.     Past Surgical History:  Procedure Laterality Date   carpel tunnel Bilateral    COLONOSCOPY W/ POLYPECTOMY     COLONOSCOPY W/ POLYPECTOMY     EXCISIONAL TOTAL SHOULDER ARTHROPLASTY WITH ANTIBIOTIC SPACER Left 08/09/2016   Procedure: LEFT SHOULDER HARDWARE REMOVAL, IRRIGATION AND DEBRIDEMENT, PLACEMENT OF ANTIBIOTIC SPACER;  Surgeon: Netta Cedars, MD;  Location: [place];  Service: Orthopedics;  Laterality: Left;   IRRIGATION AND DEBRIDEMENT SHOULDER Left 03/31/2016   Procedure: IRRIGATION AND DEBRIDEMENT SHOULDER, deep cultures and polyethylene exchange;  Surgeon: Netta Cedars, MD;  Location: [place];  Service: Orthopedics;  Laterality: Left;  requests 67mins   REVERSE SHOULDER ARTHROPLASTY Left 01/30/2016   Procedure: LEFT REVERSE SHOULDER ARTHROPLASTY;  Surgeon: Netta Cedars, MD;  Location: [place];  Service: Orthopedics;  Laterality: Left;   ROTATOR CUFF REPAIR Left    SHOULDER ARTHROSCOPY WITH DISTAL CLAVICLE RESECTION Right 01/30/2018   Procedure: RIGHT SHOULDER ARTHROSCOPY WITH OPEN DISTAL CLAVICLE RESECTION, SUBACROMIAL DECOMPRESSION;  Surgeon: Netta Cedars, MD;  Location: [place];  Service: Orthopedics;  Laterality: Right;   STERIOD INJECTION Right 01/30/2018   Procedure: STEROID INJECTION;  Surgeon: Netta Cedars, MD;  Location: [place];  Service: Orthopedics;  Laterality:  Right;   TOTAL SHOULDER REVISION Left 01/17/2017   Procedure: LEFT SHOULDER REVISION REVERSE TOTAL SHOULDER ARTHROPLASTY;  Surgeon: Netta Cedars, MD;  Location: [place];  Service: Orthopedics;  Laterality: Left;   WRIST SURGERY Right 11/2014   Dr. Amedeo Plenty    Social History   Socioeconomic History   Marital status: Domestic Partner    Spouse name: Frederick Guzman   Number of children: 2   Years of education: 12   Highest education level: 12th grade  Occupational History   Occupation: lineman    Comment: retired  Tobacco  Use   Smoking status: Every Day    Packs/day: 0.75    Years: 43.00    Pack years: 32.25    Types: Cigarettes   Smokeless tobacco: Never   Tobacco comments:    pt no longer taking chantix  Vaping Use   Vaping Use: Never used  Substance and Sexual Activity   Alcohol use: Yes    Alcohol/week: 9.0 standard drinks    Types: 6 Cans of beer, 3 Shots of liquor per week    Comment: beer or mixed drink    Drug use: No   Sexual activity: Yes  Other Topics Concern   Not on file  Social History Narrative   Lives with his girlfriend, Frederick Guzman. Retired. DOes a lot of yard work and handy work. Members of the camping club.   Social Determinants of Health   Financial Resource Strain: Low Risk    Difficulty of Paying Living Expenses: Not hard at all  Food Insecurity: No Food Insecurity   Worried About Charity fundraiser in the Last Year: Never true   [place] in the Last Year: Never true  Transportation Needs: No Transportation Needs   Lack of Transportation (Medical): No   Lack of Transportation (Non-Medical): No  Physical Activity: Sufficiently Active   Days of Exercise per Week: 4 days   Minutes of Exercise per Session: 60 min  Stress: No Stress Concern Present   Feeling of Stress : Not at all  Social Connections: Socially Integrated   Frequency of Communication with Friends and Family: More than three times a week   Frequency of Social Gatherings with Friends and Family: Three times a week   Attends Religious Services: More than 4 times per year   Active Member of Clubs or Organizations: Yes   Attends Archivist Meetings: 1 to 4 times per year   Marital Status: Living with partner    Family History  Problem Relation Age of Onset   Diabetes Mother    Colon cancer Father    Prostate cancer Father    Diabetes Father    Esophageal cancer Neg Hx    Rectal cancer Neg Hx    Stomach cancer Neg Hx     Health Maintenance  Topic Date Due   COVID-19 Vaccine (4 - Booster  for Pfizer series) 02/27/2020   INFLUENZA VACCINE  06/19/2021 (Originally 10/20/2020)   Hepatitis C Screening  08/04/2021 (Originally 07/29/1968)   HEMOGLOBIN A1C  05/26/2021   TETANUS/TDAP  05/30/2021   OPHTHALMOLOGY EXAM  12/15/2021   FOOT EXAM  03/31/2022   COLONOSCOPY (Pts 45-50yrs Insurance coverage will need to be confirmed)  05/11/2025   Pneumonia Vaccine 65+ Years old  Completed   Zoster Vaccines- Shingrix  Completed   HPV VACCINES  Aged Out     ----------------------------------------------------------------------------------------------------------------------------------------------------------------------------------------------------------------- Physical Exam BP (!) 142/78 (BP Location: Left Arm, Patient Position: Sitting, Cuff Size: Large)  Pulse 83    Temp (!) 97.5 F (36.4 C)    Ht 6\' 1"  (1.854 m)    Wt 205 lb (93 kg)    SpO2 99%    BMI 27.05 kg/m   Physical Exam Constitutional:      Appearance: Normal appearance.  Cardiovascular:     Rate and Rhythm: Normal rate and regular rhythm.  Pulmonary:     Effort: Pulmonary effort is normal.     Breath sounds: Normal breath sounds.  Musculoskeletal:     Cervical back: Neck supple.  Neurological:     Mental Status: He is alert.  Psychiatric:        Mood and Affect: Mood normal.        Behavior: Behavior normal.    ------------------------------------------------------------------------------------------------------------------------------------------------------------------------------------------------------------------- Assessment and Plan  HTN (hypertension) Blood pressure is mildly elevated today.  We will continue current medications and encouraged to follow low-sodium diet.  Also encouraged to quit smoking.  Uncontrolled type 2 diabetes mellitus with hyperglycemia, without long-term current use of insulin (HCC) Checking A1c today.  He will continue current medications for now and will adjust insulin as needed  based on lab results.  He will continue to monitor glucose at home.  Hypogonadism male Doing well with current dose of testosterone.  Update testosterone levels, PSA and CBC.  MDD (major depressive disorder) ([place]) Continues to do well with current dose of bupropion.  We will continue at current strength.   No orders of the defined types were placed in this encounter.   No follow-ups on file.    This visit occurred during the SARS-CoV-2 public health emergency.  Safety protocols were in place, including screening questions prior to the visit, additional usage of staff PPE, and extensive cleaning of exam room while observing appropriate contact time as indicated for disinfecting solutions.

## 2021-04-01 LAB — CBC WITH DIFFERENTIAL/PLATELET
Absolute Monocytes: 476 cells/uL (ref 200–950)
Basophils Absolute: 31 cells/uL (ref 0–200)
Basophils Relative: 0.4 %
Eosinophils Absolute: 499 cells/uL (ref 15–500)
Eosinophils Relative: 6.4 %
HCT: 43.5 % (ref 38.5–50.0)
Hemoglobin: 14.9 g/dL (ref 13.2–17.1)
Lymphs Abs: 2519 cells/uL (ref 850–3900)
MCH: 32 pg (ref 27.0–33.0)
MCHC: 34.3 g/dL (ref 32.0–36.0)
MCV: 93.3 fL (ref 80.0–100.0)
MPV: 9.2 fL (ref 7.5–12.5)
Monocytes Relative: 6.1 %
Neutro Abs: 4274 cells/uL (ref 1500–7800)
Neutrophils Relative %: 54.8 %
Platelets: 208 10*3/uL (ref 140–400)
RBC: 4.66 10*6/uL (ref 4.20–5.80)
RDW: 12.8 % (ref 11.0–15.0)
Total Lymphocyte: 32.3 %
WBC: 7.8 10*3/uL (ref 3.8–10.8)

## 2021-04-01 LAB — COMPLETE METABOLIC PANEL WITH GFR
AG Ratio: 2 (calc) (ref 1.0–2.5)
ALT: 25 U/L (ref 9–46)
AST: 20 U/L (ref 10–35)
Albumin: 4.2 g/dL (ref 3.6–5.1)
Alkaline phosphatase (APISO): 84 U/L (ref 35–144)
BUN: 11 mg/dL (ref 7–25)
CO2: 31 mmol/L (ref 20–32)
Calcium: 9.1 mg/dL (ref 8.6–10.3)
Chloride: 103 mmol/L (ref 98–110)
Creat: 1.05 mg/dL (ref 0.70–1.28)
Globulin: 2.1 g/dL (calc) (ref 1.9–3.7)
Glucose, Bld: 91 mg/dL (ref 65–99)
Potassium: 3.7 mmol/L (ref 3.5–5.3)
Sodium: 142 mmol/L (ref 135–146)
Total Bilirubin: 0.6 mg/dL (ref 0.2–1.2)
Total Protein: 6.3 g/dL (ref 6.1–8.1)
eGFR: 76 mL/min/{1.73_m2} (ref >=60)

## 2021-04-01 LAB — HEMOGLOBIN A1C
Hgb A1c MFr Bld: 6.4 % of total Hgb — ABNORMAL HIGH (ref <5.7)
Mean Plasma Glucose: 137 mg/dL
eAG (mmol/L): 7.6 mmol/L

## 2021-04-01 LAB — LIPID PANEL W/REFLEX DIRECT LDL
Cholesterol: 155 mg/dL (ref <200)
HDL: 48 mg/dL (ref >=40)
LDL Cholesterol (Calc): 92 mg/dL (calc)
Non-HDL Cholesterol (Calc): 107 mg/dL (calc) (ref <130)
Total CHOL/HDL Ratio: 3.2 (calc) (ref <5.0)
Triglycerides: 64 mg/dL (ref <150)

## 2021-04-01 LAB — PSA: PSA: 2.09 ng/mL (ref <=4.00)

## 2021-04-01 LAB — TESTOSTERONE: Testosterone: 288 ng/dL (ref 250–827)

## 2021-04-12 ENCOUNTER — Encounter: Payer: Self-pay | Admitting: Family Medicine

## 2021-04-13 ENCOUNTER — Other Ambulatory Visit: Payer: Self-pay

## 2021-04-13 ENCOUNTER — Ambulatory Visit (INDEPENDENT_AMBULATORY_CARE_PROVIDER_SITE_OTHER): Payer: Medicare Other

## 2021-04-13 ENCOUNTER — Ambulatory Visit (INDEPENDENT_AMBULATORY_CARE_PROVIDER_SITE_OTHER): Payer: Medicare Other | Admitting: Family Medicine

## 2021-04-13 ENCOUNTER — Encounter: Payer: Self-pay | Admitting: Family Medicine

## 2021-04-13 VITALS — BP 175/62 | HR 93 | Temp 98.0°F | Ht 73.0 in | Wt 204.0 lb

## 2021-04-13 DIAGNOSIS — R051 Acute cough: Secondary | ICD-10-CM | POA: Diagnosis not present

## 2021-04-13 DIAGNOSIS — R062 Wheezing: Secondary | ICD-10-CM | POA: Diagnosis not present

## 2021-04-13 DIAGNOSIS — J449 Chronic obstructive pulmonary disease, unspecified: Secondary | ICD-10-CM | POA: Diagnosis not present

## 2021-04-13 DIAGNOSIS — R0602 Shortness of breath: Secondary | ICD-10-CM | POA: Diagnosis not present

## 2021-04-13 DIAGNOSIS — R059 Cough, unspecified: Secondary | ICD-10-CM | POA: Diagnosis not present

## 2021-04-13 DIAGNOSIS — J441 Chronic obstructive pulmonary disease with (acute) exacerbation: Secondary | ICD-10-CM | POA: Insufficient documentation

## 2021-04-13 HISTORY — DX: Chronic obstructive pulmonary disease with (acute) exacerbation: J44.1

## 2021-04-13 IMAGING — DX DG CHEST 2V
2 series · 2 of 2 positions shown · non-contrast
Comparison: [DATE] and a 319

CLINICAL DATA: Cough, wheezing, shortness of breath for 2 weeks.

EXAM:
CHEST - 2 VIEW

[chest pa]
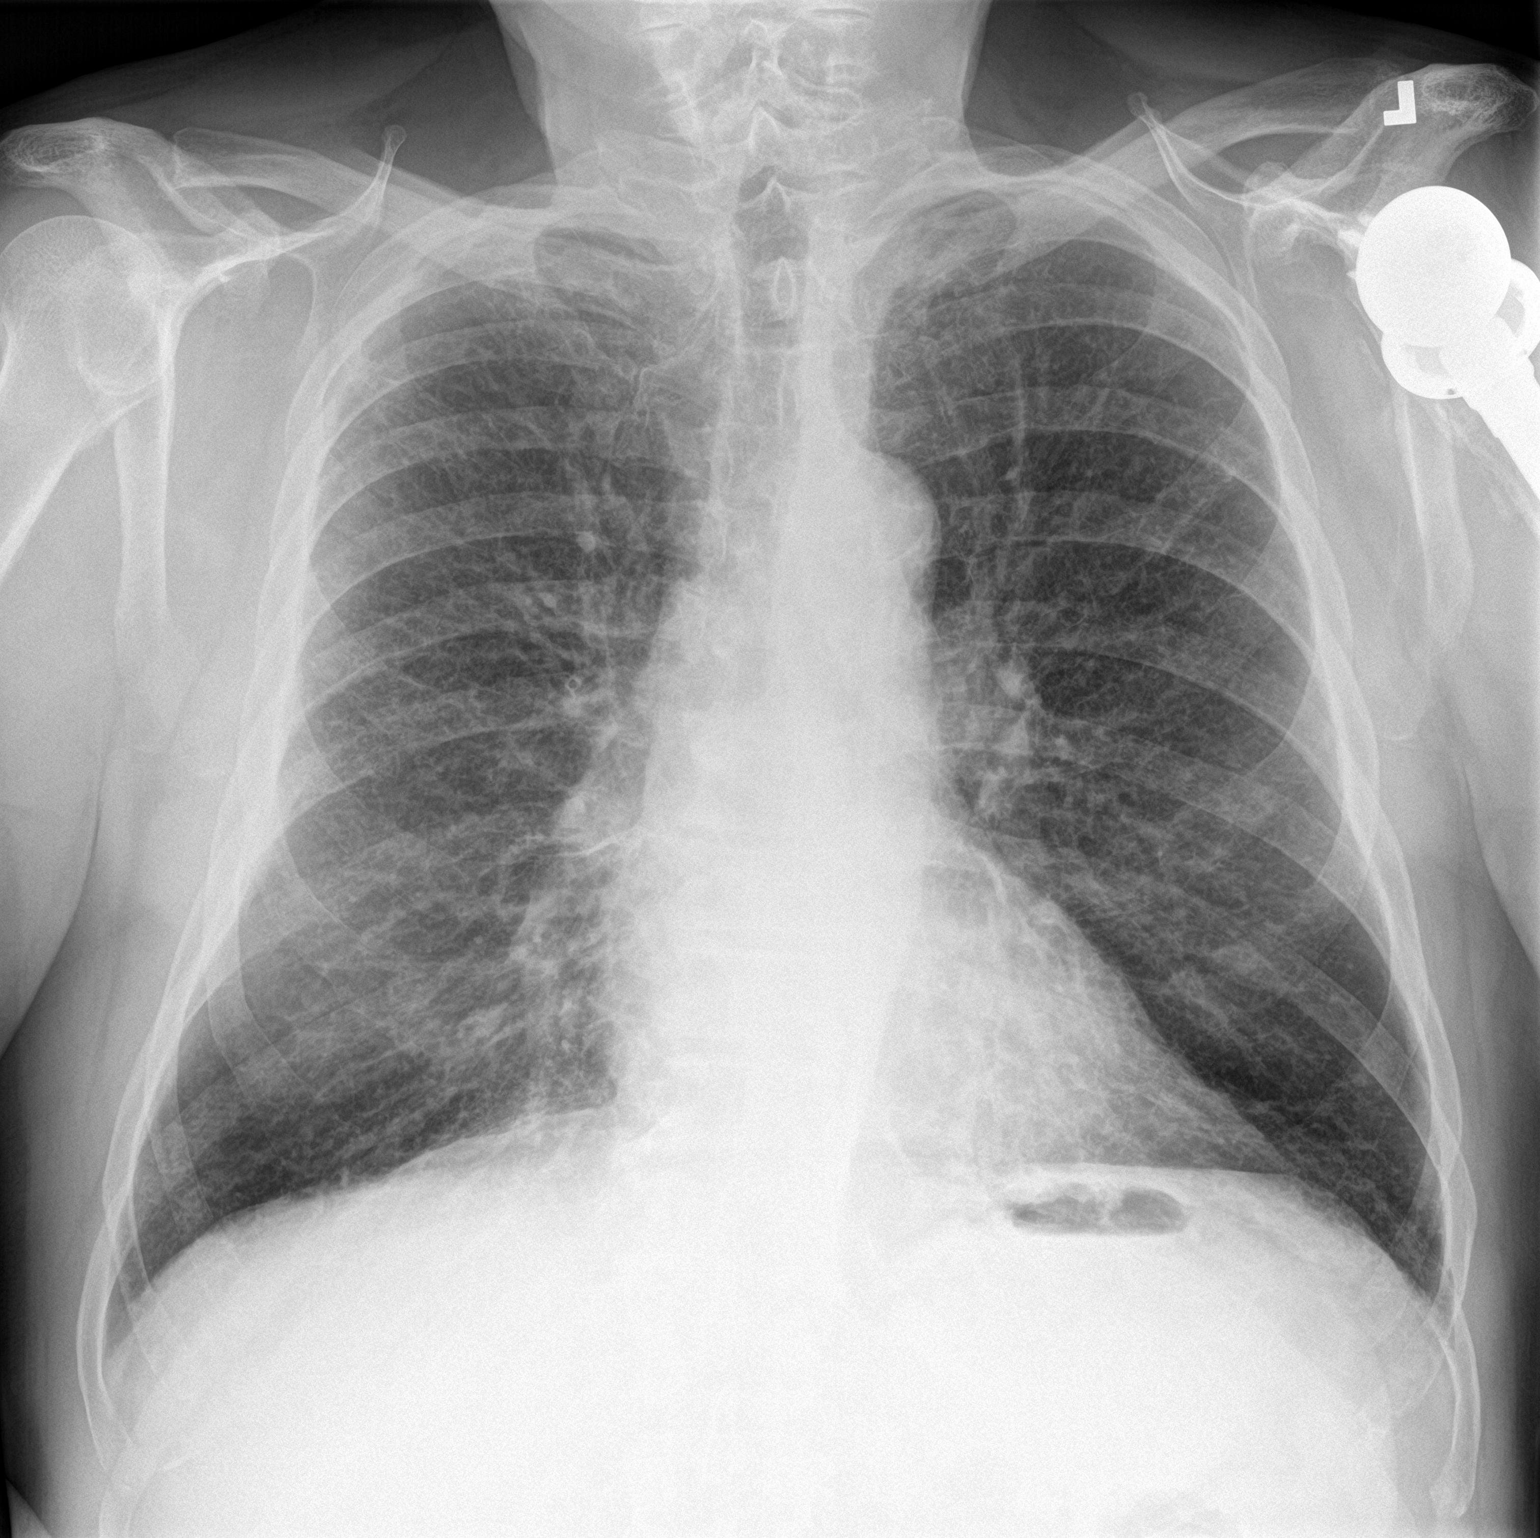

[chest lat]
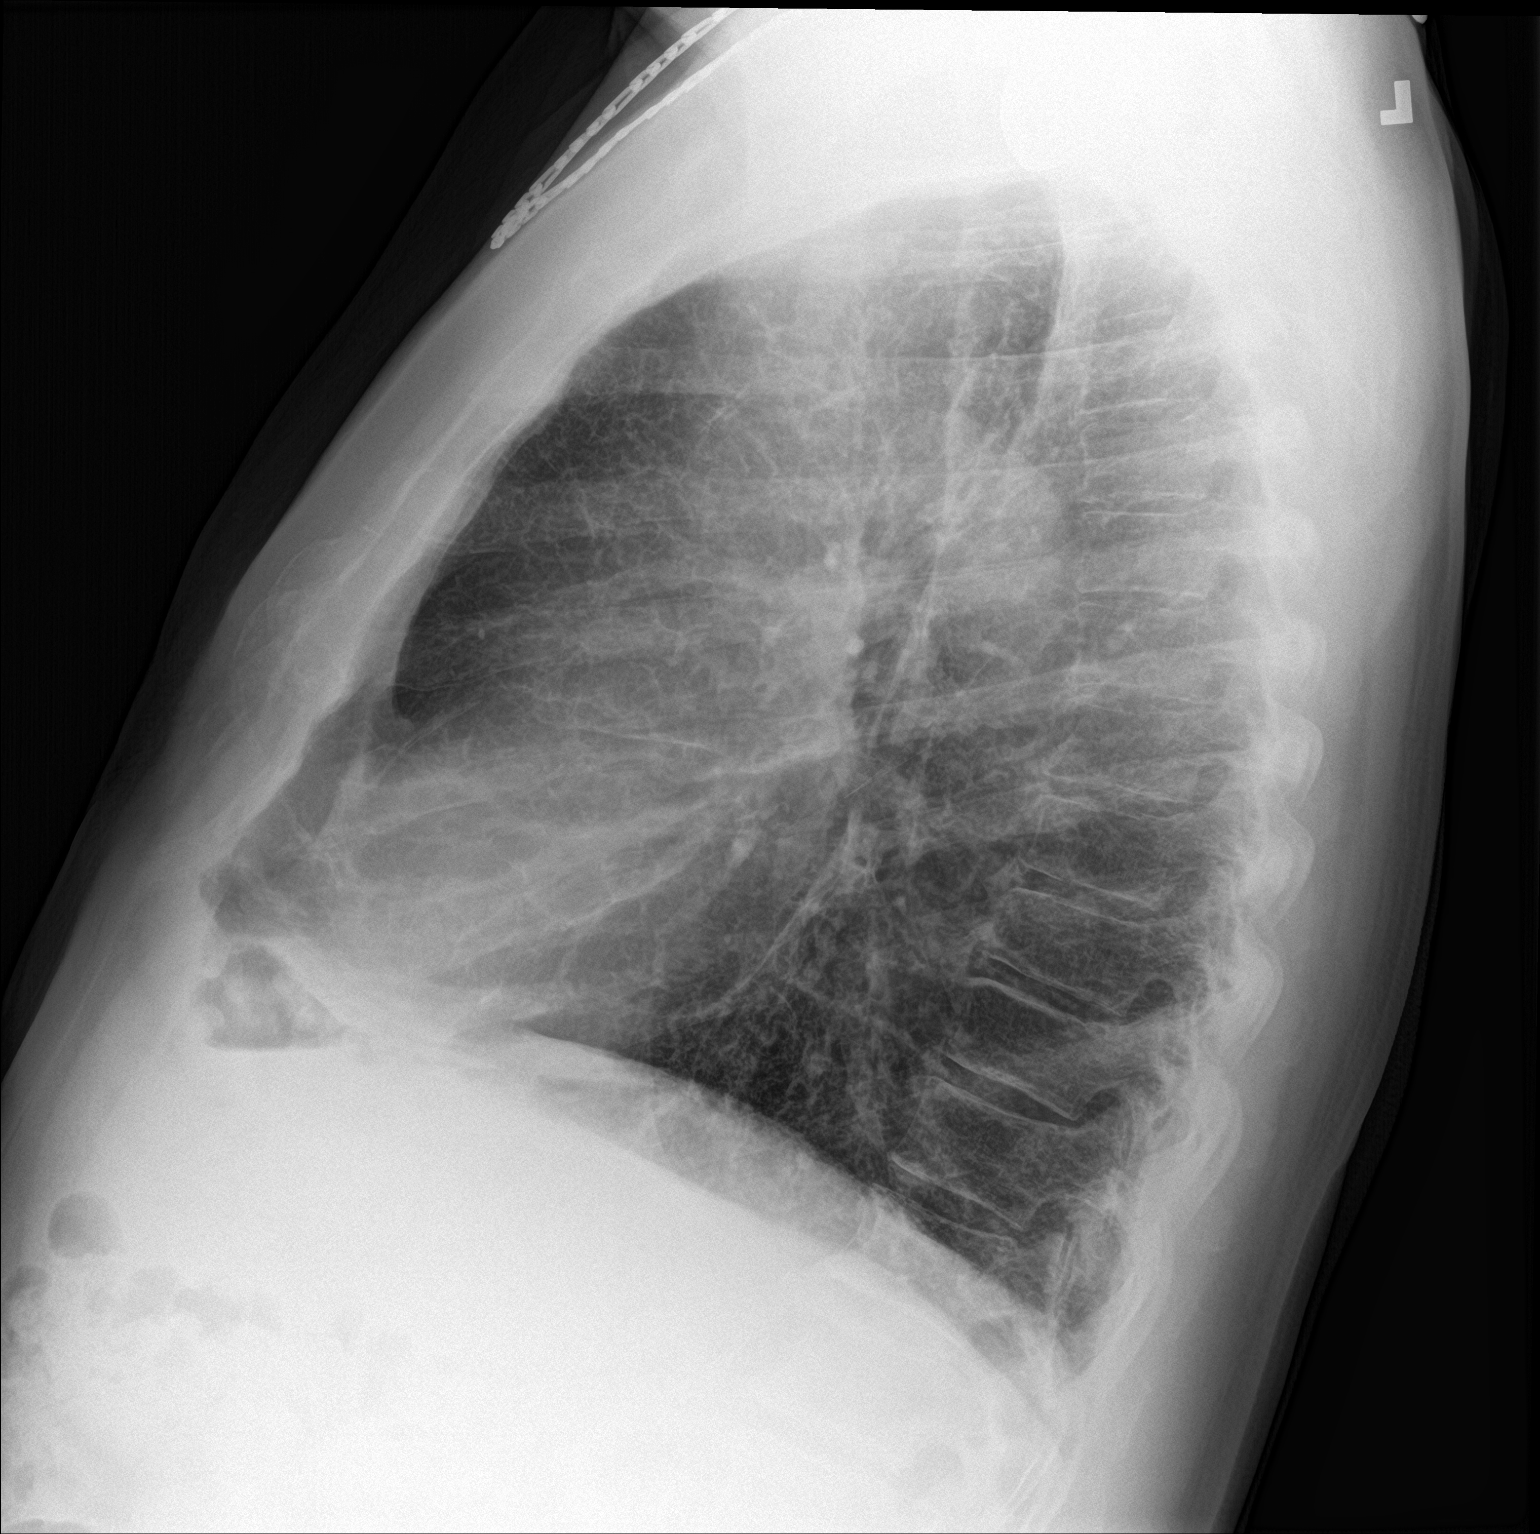

[2 of 2 positions shown; findings below may reference images not displayed]

FINDINGS: The heart size and mediastinal contours are within normal limits.
Aortic atherosclerotic calcification noted. Pulmonary hyperinflation
and chronic interstitial prominence is again seen, consistent with
COPD. No evidence of acute infiltrate or edema. No evidence of
pleural effusion. Left shoulder prosthesis again noted.
IMPRESSION: COPD. No active cardiopulmonary disease.

## 2021-04-13 MED ORDER — METHYLPREDNISOLONE SODIUM SUCC 125 MG IJ SOLR
125.0000 mg | Freq: Once | INTRAMUSCULAR | Status: AC
  Administered 2021-04-13: 125 mg via INTRAMUSCULAR

## 2021-04-13 MED ORDER — AMOXICILLIN-POT CLAVULANATE 875-125 MG PO TABS: 1.0000 | ORAL_TABLET | Freq: Two times a day (BID) | ORAL | 0 refills | Status: DC

## 2021-04-13 MED ORDER — PREDNISONE 50 MG PO TABS: ORAL_TABLET | ORAL | 0 refills | Status: DC

## 2021-04-13 NOTE — Progress Notes (Signed)
Frederick Guzman - 71 y.o. male MRN [id number]  Date of birth: Aug 12, 1950  Subjective Chief Complaint  Patient presents with   Shortness of Breath    HPI Frederick Guzman is a 71 year old male here today with complaint of cough and shortness of breath.  Symptoms started approximately 2 weeks ago.  These have been worsening since onset.  Cough is productive of thick clear to yellow sputum.  He has not gotten significant relief from albuterol.  He denies fever, chills, nausea or body aches.  Home COVID test was negative.  ROS:  A comprehensive ROS was completed and negative except as noted per HPI  Allergies  Allergen Reactions   Methotrexate Derivatives Nausea And Vomiting and Other (See Comments)    Hallucinations and aggressive behavior     Tramadol Nausea And Vomiting    Hallucinations and aggressive behavior     Past Medical History:  Diagnosis Date   Allergy    Arthritis    OA   Asthma    Cancer ([place]) 08/2014   melanoma on R shoulder & back - [facility]    COPD (chronic obstructive pulmonary disease) ([place])    Depression    pt. denies- 01/2016, pt on Wellbutrin for smoking cessation   Diabetes mellitus without complication ([place])    Diverticulitis    Dyspnea    with exertion   Fibromyalgia    GERD (gastroesophageal reflux disease)    Hyperlipemia    Hypertension    "never was high"   Lymphocytic colitis 04/2018   Pneumonia 06/2015, 10/2016   Sleep apnea    last study - Nov. 2017, waiting for a new machine, using the old CPAP q night after cleaning the device after he was documented to have pneumonia from his CPAP machine.     Past Surgical History:  Procedure Laterality Date   carpel tunnel Bilateral    COLONOSCOPY W/ POLYPECTOMY     COLONOSCOPY W/ POLYPECTOMY     EXCISIONAL TOTAL SHOULDER ARTHROPLASTY WITH ANTIBIOTIC SPACER Left 08/09/2016   Procedure: LEFT SHOULDER HARDWARE REMOVAL, IRRIGATION AND DEBRIDEMENT, PLACEMENT OF ANTIBIOTIC SPACER;  Surgeon: Netta Cedars, MD;   Location: [place];  Service: Orthopedics;  Laterality: Left;   IRRIGATION AND DEBRIDEMENT SHOULDER Left 03/31/2016   Procedure: IRRIGATION AND DEBRIDEMENT SHOULDER, deep cultures and polyethylene exchange;  Surgeon: Netta Cedars, MD;  Location: [place];  Service: Orthopedics;  Laterality: Left;  requests 2mins   REVERSE SHOULDER ARTHROPLASTY Left 01/30/2016   Procedure: LEFT REVERSE SHOULDER ARTHROPLASTY;  Surgeon: Netta Cedars, MD;  Location: [place];  Service: Orthopedics;  Laterality: Left;   ROTATOR CUFF REPAIR Left    SHOULDER ARTHROSCOPY WITH DISTAL CLAVICLE RESECTION Right 01/30/2018   Procedure: RIGHT SHOULDER ARTHROSCOPY WITH OPEN DISTAL CLAVICLE RESECTION, SUBACROMIAL DECOMPRESSION;  Surgeon: Netta Cedars, MD;  Location: [place];  Service: Orthopedics;  Laterality: Right;   STERIOD INJECTION Right 01/30/2018   Procedure: STEROID INJECTION;  Surgeon: Netta Cedars, MD;  Location: [place];  Service: Orthopedics;  Laterality: Right;   TOTAL SHOULDER REVISION Left 01/17/2017   Procedure: LEFT SHOULDER REVISION REVERSE TOTAL SHOULDER ARTHROPLASTY;  Surgeon: Netta Cedars, MD;  Location: [employer] [facility];  Service: Orthopedics;  Laterality: Left;   WRIST SURGERY Right 11/2014   Dr. Amedeo Plenty    Social History   Socioeconomic History   Marital status: Domestic Partner    Spouse name: Webb Silversmith   Number of children: 2   Years of education: 12   Highest education level: 12th grade  Occupational History  Occupation: lineman    Comment: retired  Tobacco Use   Smoking status: Every Day    Packs/day: 0.75    Years: 43.00    Pack years: 32.25    Types: Cigarettes   Smokeless tobacco: Never   Tobacco comments:    pt no longer taking chantix  Vaping Use   Vaping Use: Never used  Substance and Sexual Activity   Alcohol use: Yes    Alcohol/week: 9.0 standard drinks    Types: 6 Cans of beer, 3 Shots of liquor per week    Comment: beer or mixed drink    Drug use: No   Sexual activity: Yes  Other Topics  Concern   Not on file  Social History Narrative   Lives with his girlfriend, Webb Silversmith. Retired. DOes a lot of yard work and handy work. Members of the camping club.   Social Determinants of Health   Financial Resource Strain: Low Risk    Difficulty of Paying Living Expenses: Not hard at all  Food Insecurity: No Food Insecurity   Worried About Charity fundraiser in the Last Year: Never true   [place] in the Last Year: Never true  Transportation Needs: No Transportation Needs   Lack of Transportation (Medical): No   Lack of Transportation (Non-Medical): No  Physical Activity: Sufficiently Active   Days of Exercise per Week: 4 days   Minutes of Exercise per Session: 60 min  Stress: No Stress Concern Present   Feeling of Stress : Not at all  Social Connections: Socially Integrated   Frequency of Communication with Friends and Family: More than three times a week   Frequency of Social Gatherings with Friends and Family: Three times a week   Attends Religious Services: More than 4 times per year   Active Member of Clubs or Organizations: Yes   Attends Archivist Meetings: 1 to 4 times per year   Marital Status: Living with partner    Family History  Problem Relation Age of Onset   Diabetes Mother    Colon cancer Father    Prostate cancer Father    Diabetes Father    Esophageal cancer Neg Hx    Rectal cancer Neg Hx    Stomach cancer Neg Hx     Health Maintenance  Topic Date Due   COVID-19 Vaccine (4 - Booster for Pfizer series) 02/27/2020   INFLUENZA VACCINE  06/19/2021 (Originally 10/20/2020)   Hepatitis C Screening  08/04/2021 (Originally 07/29/1968)   TETANUS/TDAP  05/30/2021   HEMOGLOBIN A1C  09/28/2021   OPHTHALMOLOGY EXAM  12/15/2021   FOOT EXAM  03/31/2022   COLONOSCOPY (Pts 45-80yrs Insurance coverage will need to be confirmed)  05/11/2025   Pneumonia Vaccine 76+ Years old  Completed   Zoster Vaccines- Shingrix  Completed   HPV VACCINES  Aged Out      ----------------------------------------------------------------------------------------------------------------------------------------------------------------------------------------------------------------- Physical Exam BP (!) 175/62 (BP Location: Left Arm, Patient Position: Sitting, Cuff Size: Normal)    Pulse 93    Temp 98 F (36.7 C)    Ht 6\' 1"  (1.854 m)    Wt 204 lb (92.5 kg)    SpO2 97%    BMI 26.91 kg/m   Physical Exam Constitutional:      Appearance: He is well-developed.  HENT:     Head: Normocephalic and atraumatic.  Eyes:     General: No scleral icterus. Cardiovascular:     Rate and Rhythm: Normal rate and regular rhythm.  Pulmonary:  Comments: Diffuse wheezing with scattered rhonchi. Musculoskeletal:     Cervical back: Neck supple.  Neurological:     General: No focal deficit present.     Mental Status: He is alert.  Psychiatric:        Mood and Affect: Mood normal.        Behavior: Behavior normal.    ------------------------------------------------------------------------------------------------------------------------------------------------------------------------------------------------------------------- Assessment and Plan  COPD with exacerbation ([place]) Given injection of Solu-Medrol in clinic today.  Adding course of Augmentin with 5-day course of prednisone.  Continue albuterol as needed.  Chest x-ray ordered.   Meds ordered this encounter  Medications   predniSONE (DELTASONE) 50 MG tablet    Sig: Take 1 tab po daily x5 days.    Dispense:  5 tablet    Refill:  0   amoxicillin-clavulanate (AUGMENTIN) 875-125 MG tablet    Sig: Take 1 tablet by mouth 2 (two) times daily.    Dispense:  20 tablet    Refill:  0   methylPREDNISolone sodium succinate (SOLU-MEDROL) 125 mg/2 mL injection 125 mg    No follow-ups on file.    This visit occurred during the SARS-CoV-2 public health emergency.  Safety protocols were in place, including screening  questions prior to the visit, additional usage of staff PPE, and extensive cleaning of exam room while observing appropriate contact time as indicated for disinfecting solutions.

## 2021-04-13 NOTE — Telephone Encounter (Signed)
Pt has been scheduled for this afternoon with Dr Zigmund Daniel. AM

## 2021-04-13 NOTE — Assessment & Plan Note (Signed)
Given injection of Solu-Medrol in clinic today.  Adding course of Augmentin with 5-day course of prednisone.  Continue albuterol as needed.  Chest x-ray ordered.

## 2021-04-13 NOTE — Patient Instructions (Signed)

## 2021-04-14 ENCOUNTER — Other Ambulatory Visit: Payer: Self-pay | Admitting: Osteopathic Medicine

## 2021-04-14 DIAGNOSIS — E291 Testicular hypofunction: Secondary | ICD-10-CM

## 2021-05-12 ENCOUNTER — Other Ambulatory Visit: Payer: Self-pay | Admitting: Family Medicine

## 2021-05-18 DIAGNOSIS — F17218 Nicotine dependence, cigarettes, with other nicotine-induced disorders: Secondary | ICD-10-CM | POA: Diagnosis not present

## 2021-05-18 DIAGNOSIS — G4733 Obstructive sleep apnea (adult) (pediatric): Secondary | ICD-10-CM | POA: Diagnosis not present

## 2021-05-18 DIAGNOSIS — J449 Chronic obstructive pulmonary disease, unspecified: Secondary | ICD-10-CM | POA: Diagnosis not present

## 2021-05-29 DIAGNOSIS — Z20822 Contact with and (suspected) exposure to covid-19: Secondary | ICD-10-CM | POA: Diagnosis not present

## 2021-06-10 DIAGNOSIS — R350 Frequency of micturition: Secondary | ICD-10-CM | POA: Diagnosis not present

## 2021-06-10 DIAGNOSIS — N401 Enlarged prostate with lower urinary tract symptoms: Secondary | ICD-10-CM | POA: Diagnosis not present

## 2021-06-13 ENCOUNTER — Other Ambulatory Visit: Payer: Self-pay | Admitting: Sports Medicine

## 2021-06-16 ENCOUNTER — Other Ambulatory Visit: Payer: Self-pay

## 2021-06-16 ENCOUNTER — Telehealth: Payer: Self-pay | Admitting: Family Medicine

## 2021-06-16 ENCOUNTER — Emergency Department (INDEPENDENT_AMBULATORY_CARE_PROVIDER_SITE_OTHER)
Admission: EM | Admit: 2021-06-16 | Discharge: 2021-06-16 | Disposition: A | Discharge status: Home / Self Care | Payer: Medicare Other | Source: Home / Self Care | Attending: Family Medicine | Admitting: Family Medicine

## 2021-06-16 DIAGNOSIS — J441 Chronic obstructive pulmonary disease with (acute) exacerbation: Secondary | ICD-10-CM | POA: Diagnosis not present

## 2021-06-16 DIAGNOSIS — J069 Acute upper respiratory infection, unspecified: Secondary | ICD-10-CM | POA: Diagnosis not present

## 2021-06-16 MED ORDER — ALBUTEROL SULFATE (2.5 MG/3ML) 0.083% IN NEBU
2.5000 mg | INHALATION_SOLUTION | Freq: Once | RESPIRATORY_TRACT | Status: AC
  Administered 2021-06-16: 2.5 mg via RESPIRATORY_TRACT

## 2021-06-16 MED ORDER — PREDNISONE 20 MG PO TABS: 40.0000 mg | ORAL_TABLET | Freq: Every day | ORAL | 0 refills | Status: DC

## 2021-06-16 MED ORDER — AZELASTINE HCL 0.1 % NA SOLN: 2.0000 | Freq: Two times a day (BID) | NASAL | 0 refills | Status: DC

## 2021-06-16 MED ORDER — METHYLPREDNISOLONE SODIUM SUCC 125 MG IJ SOLR
80.0000 mg | Freq: Once | INTRAMUSCULAR | Status: AC
  Administered 2021-06-16: 80 mg via INTRAMUSCULAR

## 2021-06-16 MED ORDER — AMOXICILLIN-POT CLAVULANATE 875-125 MG PO TABS: 1.0000 | ORAL_TABLET | Freq: Two times a day (BID) | ORAL | 0 refills | Status: DC

## 2021-06-16 NOTE — ED Triage Notes (Signed)
Pt presents with cough, congestion, facial pain, and HA that began last week. Pt states he began using OTC allergy medication that has not improved sx.  ?

## 2021-06-16 NOTE — Discharge Instructions (Signed)
Try to drink more water ?Run a humidifier in your room if you have 1 ?Try the Astelin nasal spray to help with your sleep at night ?Take antibiotic 2 times a day as prescribed ?Take prednisone once a day for 5 days ?Watch your diet and carbohydrates while on the prednisone ?See Dr. Zigmund Daniel if not improving by next week ?

## 2021-06-16 NOTE — Telephone Encounter (Signed)
Called patient on 3/27 around 5pm to offer an appt with Dr. Zigmund Daniel on Wednesday at 1:10. Per patient he will be dead by then. Then he stated he would find someone else, said goodbye and hung up on me.  ? ?Today we had a mychart message that was send on 3/27 at 5:17 saying the patient needed an appointment because it was in he chest and could not breathe. Spoke with Dr. Zigmund Daniel about the message and he said to call patient and offer the appointment on Wednesday at 1:10 again and let patient know he could send in steroids to help until he is seen. Called patient at 8:30 to offer the appointment again. Per patient he will find someone else to help him and hung up on me again. ?

## 2021-06-16 NOTE — ED Provider Notes (Addendum)
?[facility] ? ? ? ?CSN: [id number] ?Arrival date & time: 06/16/21  1138 ? ? ?  ? ?History   ?Chief Complaint ?Chief Complaint  ?Patient presents with  ? Headache  ? Facial Pain  ? Nasal Congestion  ? Cough  ? ? ?HPI ?Frederick Guzman is a 71 y.o. male.  ? ?HPI ? ?Patient is a smoker with known COPD who has had cough congestion facial pain and headache that started last week.  He states after a week of symptoms he is not any better.  OTC allergy medicines or not improving his symptoms.  He is quite short of breath.  He is coughing.  He is using his inhalers.  No fever or chills.  He did a COVID test at home that was negative. ?States that he has productive cough, thick tan sputum.  No blood ?He is cared for Dr. Zigmund Daniel.  Compliant with care ?He is diabetic.  Last hemoglobin A1c was 6.4 ? ?Past Medical History:  ?Diagnosis Date  ? Allergy   ? Arthritis   ? OA  ? Asthma   ? Cancer ([place]) 08/2014  ? melanoma on R shoulder & back - [facility]   ? COPD (chronic obstructive pulmonary disease) ([place])   ? Depression   ? pt. denies- 01/2016, pt on Wellbutrin for smoking cessation  ? Diabetes mellitus without complication ([place])   ? Diverticulitis   ? Dyspnea   ? with exertion  ? Fibromyalgia   ? GERD (gastroesophageal reflux disease)   ? Hyperlipemia   ? Hypertension   ? "never was high"  ? Lymphocytic colitis 04/2018  ? Pneumonia 06/2015, 10/2016  ? Sleep apnea   ? last study - Nov. 2017, waiting for a new machine, using the old CPAP q night after cleaning the device after he was documented to have pneumonia from his CPAP machine.   ? ? ?Patient Active Problem List  ? Diagnosis Date Noted  ? COPD with exacerbation ([place]) 04/13/2021  ? Hyperlipidemia 08/07/2020  ? Combined forms of age-related cataract of right eye 09/21/2018  ? Testosterone deficiency in male 04/10/2018  ? Pulmonary nodules 10/23/2017  ? Sepsis, unspecified organism ([place]) 10/23/2017  ? Pain in right hand 08/04/2017  ? Long term use of drug 04/19/2017   ? Pain in joint of left shoulder 04/19/2017  ? 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  ? Positive depression screening 09/21/2016  ? Uncontrolled type 2 diabetes mellitus with hyperglycemia, without long-term current use of insulin ([place]) 09/21/2016  ? Status post shoulder surgery 08/09/2016  ? Fibromyalgia 05/21/2016  ? S/P shoulder replacement, left 01/30/2016  ? Depression   ? HTN (hypertension)   ? BPH (benign prostatic hyperplasia) 06/17/2011  ? Hypogonadism male 06/17/2011  ? Arthritis of wrist 05/28/2011  ? MDD (major depressive disorder) ([place]) 04/30/2011  ? GERD (gastroesophageal reflux disease) 11/23/2010  ? DERMATITIS, ATOPIC 11/23/2010  ? COPD (chronic obstructive pulmonary disease) ([place]) 10/07/2010  ? TOBACCO ABUSE 03/10/2007  ? OBSTRUCTIVE SLEEP APNEA 03/10/2007  ? BRONCHITIS, CHRONIC 03/10/2007  ? ASTHMA, MODERATE 03/10/2007  ? ? ?Past Surgical History:  ?Procedure Laterality Date  ? carpel tunnel Bilateral   ? COLONOSCOPY W/ POLYPECTOMY    ? COLONOSCOPY W/ POLYPECTOMY    ? EXCISIONAL TOTAL SHOULDER ARTHROPLASTY WITH ANTIBIOTIC SPACER Left 08/09/2016  ? Procedure: LEFT SHOULDER HARDWARE REMOVAL, IRRIGATION AND DEBRIDEMENT, PLACEMENT OF ANTIBIOTIC SPACER;  Surgeon: Netta Cedars, MD;  Location: [facility];  Service: Orthopedics;  Laterality: Left;  ? IRRIGATION AND DEBRIDEMENT SHOULDER  Left 03/31/2016  ? Procedure: IRRIGATION AND DEBRIDEMENT SHOULDER, deep cultures and polyethylene exchange;  Surgeon: Netta Cedars, MD;  Location: [place];  Service: Orthopedics;  Laterality: Left;  requests 103mns  ? REVERSE SHOULDER ARTHROPLASTY Left 01/30/2016  ? Procedure: LEFT REVERSE SHOULDER ARTHROPLASTY;  Surgeon: SNetta Cedars MD;  Location[place] [facility]  Service: Orthopedics;  Laterality: Left;  ? ROTATOR CUFF REPAIR Left   ? SHOULDER ARTHROSCOPY WITH DISTAL CLAVICLE RESECTION Right 01/30/2018  ? Procedure: RIGHT SHOULDER ARTHROSCOPY WITH OPEN DISTAL CLAVICLE RESECTION, SUBACROMIAL DECOMPRESSION;  Surgeon: NNetta Cedars MD;  [place]  Service: Orthopedics;  Laterality: Right;  ? STERIOD INJECTION Right 01/30/2018  ? Procedure: STEROID INJECTION;  Surgeon: NNetta Cedars MD;  [place]  Service: Orthopedics;  Laterality: Right;  ? TOTAL SHOULDER REVISION Left 01/17/2017  ? Procedure: LEFT SHOULDER REVISION REVERSE TOTAL SHOULDER ARTHROPLASTY;  Surgeon: NNetta Cedars MD;  [place] [facility] [place]  Service: Orthopedics;  Laterality: Left;  ? WRIST SURGERY Right 11/2014  ? Dr. GAmedeo Plenty ? ? ? ? ? ?Home Medications   ? ?Prior to Admission medications   ?Medication Sig Start Date End Date Taking? Authorizing Provider  ?azelastine (ASTELIN) 0.1 % nasal spray Place 2 sprays into both nostrils 2 (two) times daily. Use in each nostril as directed 06/16/21  Yes NRaylene Everts MD  ?predniSONE (DELTASONE) 20 MG tablet Take 2 tablets (40 mg total) by mouth daily with breakfast. 06/16/21  Yes NRaylene Everts MD  ?albuterol (PROVENTIL) (2.5 MG/3ML) 0.083% nebulizer solution Take 2.5 mg by nebulization every 6 (six) hours as needed for wheezing or shortness of breath.    [provider]  ?albuterol (VENTOLIN HFA) 108 (90 Base) MCG/ACT inhaler Inhale 2 puffs into the lungs every 6 (six) hours as needed for wheezing or shortness of breath.    [provider]  ?amoxicillin-clavulanate (AUGMENTIN) 875-125 MG tablet Take 1 tablet by mouth 2 (two) times daily. 06/16/21   NRaylene Everts MD  ?atorvastatin (LIPITOR) 20 MG tablet Take 1 tablet (20 mg total) by mouth every other day. 02/17/21   MHali Marry MD  ?b complex vitamins tablet Take 1 tablet by mouth daily.    [provider]  ?Budeson-Glycopyrrol-Formoterol (BREZTRI AEROSPHERE) 160-9-4.8 MCG/ACT AERO Inhale into the lungs. 11/14/19   [provider]  ?buPROPion (WELLBUTRIN XL) 300 MG 24 hr tablet Take 1 tablet (300 mg total) by mouth daily. 11/20/20   AEmeterio Reeve DO  ?cetirizine (ZYRTEC) 10 MG tablet Take 10 mg by mouth daily as needed for  allergies.     [provider]  ?esomeprazole (NEXIUM) 40 MG capsule Take 1 capsule (40 mg total) by mouth daily. NO REFILLS. NEEDS TO TRANSITION CARE TO NEW PCP. 06/15/21   MLuetta Nutting DO  ?fluticasone (FLONASE) 50 MCG/ACT nasal spray Place 1 spray into both nostrils daily as needed for allergies. 12/30/15   [provider]  ?furosemide (LASIX) 20 MG tablet Take 1 tablet (20 mg total) by mouth 2 (two) times daily as needed for edema. 05/13/21   MLuetta Nutting DO  ?glucose blood (PRODIGY NO CODING BLOOD GLUC) test strip Use up to 4 times per day as directed with glucometer. 09/17/20   AEmeterio Reeve DO  ?insulin glargine, 2 Unit Dial, (TOUJEO MAX SOLOSTAR) 300 UNIT/ML Solostar Pen Inject 60 Units into the skin at bedtime. 01/14/21 04/14/21  MLuetta Nutting DO  ?Insulin Pen Needle 32G X 4 MM MISC For use with Lantus Solostar ?Patient taking differently:  For use with Lantus Solostar. 22 units of insulin 09/15/20   Trixie Dredge, PA-C  ?lisinopril (ZESTRIL) 2.5 MG tablet Take 1 tablet (2.5 mg total) by mouth at bedtime. 02/05/21   Donella Stade, PA-C  ?Multiple Vitamin (MULTIVITAMIN WITH MINERALS) TABS tablet Take 1 tablet by mouth at bedtime.    [provider]  ?Omega-3 Fatty Acids (FISH OIL) 1000 MG CAPS Take 1,000 mg by mouth at bedtime.     [provider]  ?Polyvinyl Alcohol-Povidone (REFRESH OP) Place 1 drop into both eyes daily as needed (dry eyes).    [provider]  ?tacrolimus (PROTOPIC) 0.1 % ointment Apply 1 application topically daily. 02/05/19   [provider]  ?tadalafil (CIALIS) 5 MG tablet Take 5 mg by mouth daily. 12/18/20   [provider]  ?tamsulosin (FLOMAX) 0.4 MG CAPS capsule Take 0.4 mg by mouth.    [provider]  ?Testosterone 30 MG/ACT SOLN APPLY 1-2 PUMPS DAILY. ALTERNATE TAKING ONE PUMP UNDER ONE ARM ONE DAY, TWO PUMPS THE NEXT DAY (ONE PUMP UNDER EACH ARM) 04/14/21   Luetta Nutting, DO   ?vitamin C (ASCORBIC ACID) 500 MG tablet Take 500 mg by mouth daily.    [provider]  ? ? ?Family History ?Family History  ?Problem Relation Age of Onset  ? Diabetes Mother   ? Colon cancer Father

## 2021-06-17 DIAGNOSIS — Z20822 Contact with and (suspected) exposure to covid-19: Secondary | ICD-10-CM | POA: Diagnosis not present

## 2021-07-09 DIAGNOSIS — Z20822 Contact with and (suspected) exposure to covid-19: Secondary | ICD-10-CM | POA: Diagnosis not present

## 2021-07-14 DIAGNOSIS — Z20822 Contact with and (suspected) exposure to covid-19: Secondary | ICD-10-CM | POA: Diagnosis not present

## 2021-07-17 ENCOUNTER — Emergency Department (INDEPENDENT_AMBULATORY_CARE_PROVIDER_SITE_OTHER)
Admission: EM | Admit: 2021-07-17 | Discharge: 2021-07-17 | Disposition: A | Discharge status: Home / Self Care | Payer: Medicare Other | Source: Home / Self Care | Attending: Family Medicine | Admitting: Family Medicine

## 2021-07-17 DIAGNOSIS — J441 Chronic obstructive pulmonary disease with (acute) exacerbation: Secondary | ICD-10-CM | POA: Diagnosis not present

## 2021-07-17 MED ORDER — METHYLPREDNISOLONE ACETATE 80 MG/ML IJ SUSP
80.0000 mg | Freq: Once | INTRAMUSCULAR | Status: AC
  Administered 2021-07-17: 80 mg via INTRAMUSCULAR

## 2021-07-17 MED ORDER — DOXYCYCLINE HYCLATE 100 MG PO CAPS: ORAL_CAPSULE | ORAL | 0 refills | Status: DC

## 2021-07-17 NOTE — ED Provider Notes (Signed)
?[facility] ? ? ? ?CSN: [id number] ?Arrival date & time: 07/17/21  1005 ? ? ?  ? ?History   ?Chief Complaint ?Chief Complaint  ?Patient presents with  ? Cough  ?  Cough, chest congestion, and sob. X3 months  ? ? ?HPI ?Frederick Guzman is a 71 y.o. male.  ? ?This patient with a history of chronic COPD and tobacco abuse was treated here 06/16/21 for COPD exacerbation and URI with Augmentin, SoluMedrol injection, and prednisone taper.  He had also been seen on 05/18/21 by his pulmonologist and treated with amoxicillin.  He continues to use his Breztri inhaler, albuterol nebulizer, and albuterol MDI.  He states that he improved initially but has become worse during the past 10 days with increased shortness of breath with activity and tightness in his chest.  He denies fevers, chills, and sweats and pleuritic pain. ?Review of records also reveals a normal Hgb 14.1 on 03/31/21. ? ?The history is provided by the patient.  ? ?Past Medical History:  ?Diagnosis Date  ? Allergy   ? Arthritis   ? OA  ? Asthma   ? Cancer ([place]) 08/2014  ? melanoma on R shoulder & back - [facility]   ? COPD (chronic obstructive pulmonary disease) ([place])   ? Depression   ? pt. denies- 01/2016, pt on Wellbutrin for smoking cessation  ? Diabetes mellitus without complication ([place])   ? Diverticulitis   ? Dyspnea   ? with exertion  ? Fibromyalgia   ? GERD (gastroesophageal reflux disease)   ? Hyperlipemia   ? Hypertension   ? "never was high"  ? Lymphocytic colitis 04/2018  ? Pneumonia 06/2015, 10/2016  ? Sleep apnea   ? last study - Nov. 2017, waiting for a new machine, using the old CPAP q night after cleaning the device after he was documented to have pneumonia from his CPAP machine.   ? ? ?Patient Active Problem List  ? Diagnosis Date Noted  ? COPD with exacerbation ([place]) 04/13/2021  ? Hyperlipidemia 08/07/2020  ? Combined forms of age-related cataract of right eye 09/21/2018  ? Testosterone deficiency in male 04/10/2018  ? Pulmonary nodules  10/23/2017  ? Sepsis, unspecified organism ([place]) 10/23/2017  ? Pain in right hand 08/04/2017  ? Long term use of drug 04/19/2017  ? Pain in joint of left shoulder 04/19/2017  ? 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  ? Positive depression screening 09/21/2016  ? Uncontrolled type 2 diabetes mellitus with hyperglycemia, without long-term current use of insulin ([place]) 09/21/2016  ? Status post shoulder surgery 08/09/2016  ? Fibromyalgia 05/21/2016  ? S/P shoulder replacement, left 01/30/2016  ? Depression   ? HTN (hypertension)   ? BPH (benign prostatic hyperplasia) 06/17/2011  ? Hypogonadism male 06/17/2011  ? Arthritis of wrist 05/28/2011  ? MDD (major depressive disorder) ([place]) 04/30/2011  ? GERD (gastroesophageal reflux disease) 11/23/2010  ? DERMATITIS, ATOPIC 11/23/2010  ? COPD (chronic obstructive pulmonary disease) ([place]) 10/07/2010  ? TOBACCO ABUSE 03/10/2007  ? OBSTRUCTIVE SLEEP APNEA 03/10/2007  ? BRONCHITIS, CHRONIC 03/10/2007  ? ASTHMA, MODERATE 03/10/2007  ? ? ?Past Surgical History:  ?Procedure Laterality Date  ? carpel tunnel Bilateral   ? COLONOSCOPY W/ POLYPECTOMY    ? COLONOSCOPY W/ POLYPECTOMY    ? EXCISIONAL TOTAL SHOULDER ARTHROPLASTY WITH ANTIBIOTIC SPACER Left 08/09/2016  ? Procedure: LEFT SHOULDER HARDWARE REMOVAL, IRRIGATION AND DEBRIDEMENT, PLACEMENT OF ANTIBIOTIC SPACER;  Surgeon: Netta Cedars, MD;  Location: [facility];  Service: Orthopedics;  Laterality: Left;  ?  IRRIGATION AND DEBRIDEMENT SHOULDER Left 03/31/2016  ? Procedure: IRRIGATION AND DEBRIDEMENT SHOULDER, deep cultures and polyethylene exchange;  Surgeon: Netta Cedars, MD;  Location: [facility] [place];  Service: Orthopedics;  Laterality: Left;  requests 22mns  ? REVERSE SHOULDER ARTHROPLASTY Left 01/30/2016  ? Procedure: LEFT REVERSE SHOULDER ARTHROPLASTY;  Surgeon: SNetta Cedars MD;  [facility] [facility]  Service: Orthopedics;  Laterality: Left;  ? ROTATOR CUFF REPAIR Left   ? SHOULDER ARTHROSCOPY WITH DISTAL CLAVICLE RESECTION Right 01/30/2018  ? Procedure:  RIGHT SHOULDER ARTHROSCOPY WITH OPEN DISTAL CLAVICLE RESECTION, SUBACROMIAL DECOMPRESSION;  Surgeon: NNetta Cedars MD;  [place] [facility]  Service: Orthopedics;  Laterality: Right;  ? STERIOD INJECTION Right 01/30/2018  ? Procedure: STEROID INJECTION;  Surgeon: NNetta Cedars MD;  [place] [facility]  Service: Orthopedics;  Laterality: Right;  ? TOTAL SHOULDER REVISION Left 01/17/2017  ? Procedure: LEFT SHOULDER REVISION REVERSE TOTAL SHOULDER ARTHROPLASTY;  Surgeon: NNetta Cedars MD;  [place] [facility]  Service: Orthopedics;  Laterality: Left;  ? WRIST SURGERY Right 11/2014  ? Dr. GAmedeo Plenty ? ? ? ? ? ?Home Medications   ? ?Prior to Admission medications   ?Medication Sig Start Date End Date Taking? Authorizing Provider  ?albuterol (PROVENTIL) (2.5 MG/3ML) 0.083% nebulizer solution Take 2.5 mg by nebulization every 6 (six) hours as needed for wheezing or shortness of breath.   Yes [provider]  ?albuterol (VENTOLIN HFA) 108 (90 Base) MCG/ACT inhaler Inhale 2 puffs into the lungs every 6 (six) hours as needed for wheezing or shortness of breath.   Yes [provider]  ?atorvastatin (LIPITOR) 20 MG tablet Take 1 tablet (20 mg total) by mouth every other day. 02/17/21  Yes MHali Marry MD  ?azelastine (ASTELIN) 0.1 % nasal spray Place 2 sprays into both nostrils 2 (two) times daily. Use in each nostril as directed 06/16/21  Yes NRaylene Everts MD  ?b complex vitamins tablet Take 1 tablet by mouth daily.   Yes [provider]  ?Budeson-Glycopyrrol-Formoterol (BREZTRI AEROSPHERE) 160-9-4.8 MCG/ACT AERO Inhale into the lungs. 11/14/19  Yes [provider]  ?buPROPion (WELLBUTRIN XL) 300 MG 24 hr tablet Take 1 tablet (300 mg total) by mouth daily. 11/20/20  Yes AEmeterio Reeve DO  ?cetirizine (ZYRTEC) 10 MG tablet Take 10 mg by mouth daily as needed for allergies.    Yes [provider]  ?doxycycline (VIBRAMYCIN) 100 MG capsule Take one cap PO Q12hr with food 07/17/21   Yes Reegan Mctighe, SIshmael Holter MD  ?esomeprazole (NEXIUM) 40 MG capsule Take 1 capsule (40 mg total) by mouth daily. NO REFILLS. NEEDS TO TRANSITION CARE TO NEW PCP. 06/15/21  Yes MLuetta Nutting DO  ?fluticasone (FLONASE) 50 MCG/ACT nasal spray Place 1 spray into both nostrils daily as needed for allergies. 12/30/15  Yes [provider]  ?furosemide (LASIX) 20 MG tablet Take 1 tablet (20 mg total) by mouth 2 (two) times daily as needed for edema. 05/13/21  Yes MLuetta Nutting DO  ?glucose blood (PRODIGY NO CODING BLOOD GLUC) test strip Use up to 4 times per day as directed with glucometer. 09/17/20  Yes AEmeterio Reeve DO  ?Insulin Pen Needle 32G X 4 MM MISC For use with Lantus Solostar ?Patient taking differently: For use with Lantus Solostar. 22 units of insulin 09/15/20  Yes CTrixie Dredge PA-C  ?lisinopril (ZESTRIL) 2.5 MG tablet Take 1 tablet (2.5 mg total) by mouth at bedtime. 02/05/21  Yes Breeback, Jade L, PA-C  ?Multiple Vitamin (MULTIVITAMIN WITH MINERALS) TABS tablet Take 1  tablet by mouth at bedtime.   Yes [provider]  ?Omega-3 Fatty Acids (FISH OIL) 1000 MG CAPS Take 1,000 mg by mouth at bedtime.    Yes [provider]  ?Polyvinyl Alcohol-Povidone (REFRESH OP) Place 1 drop into both eyes daily as needed (dry eyes).   Yes [provider]  ?tacrolimus (PROTOPIC) 0.1 % ointment Apply 1 application topically daily. 02/05/19  Yes [provider]  ?tadalafil (CIALIS) 5 MG tablet Take 5 mg by mouth daily. 12/18/20  Yes [provider]  ?tamsulosin (FLOMAX) 0.4 MG CAPS capsule Take 0.4 mg by mouth.   Yes [provider]  ?Testosterone 30 MG/ACT SOLN APPLY 1-2 PUMPS DAILY. ALTERNATE TAKING ONE PUMP UNDER ONE ARM ONE DAY, TWO PUMPS THE NEXT DAY (ONE PUMP UNDER EACH ARM) 04/14/21  Yes Luetta Nutting, DO  ?vitamin C (ASCORBIC ACID) 500 MG tablet Take 500 mg by mouth daily.   Yes [provider]  ?insulin glargine, 2 Unit Dial, (TOUJEO  MAX SOLOSTAR) 300 UNIT/ML Solostar Pen Inject 60 Units into the skin at bedtime. 01/14/21 04/14/21  Luetta Nutting, DO  ? ? ?Family History ?Family History  ?Problem Relation Age of Onset  ? Diabetes

## 2021-07-17 NOTE — Discharge Instructions (Signed)
Take plain guaifenesin ('1200mg'$  extended release tabs such as Mucinex) twice daily, with plenty of water, for cough and congestion.  Get adequate rest.   ?Continue Flonase nasal spray. ?Try stopping Zyrtec for about 10 to 14 days. ?Continue all inhalers as prescribed. ?Begin doxycycline if not improving about one week or if persistent fever develops  ?  ?If symptoms become significantly worse during the night or over the weekend, proceed to the local emergency room.  ?

## 2021-07-17 NOTE — ED Triage Notes (Signed)
Pt states that he was seen 1 month ago. Pt states that he still has a cough, chest congestion and sob. X3 months ? ?Pt states that he seen his PCP in January for same symptoms. Pt states that he has been sick for x3 months.  ? ?Pt states that he is vaccinated for covid.  ?Pt states that he did get the flu vaccine.  ?

## 2021-07-20 DIAGNOSIS — Z20822 Contact with and (suspected) exposure to covid-19: Secondary | ICD-10-CM | POA: Diagnosis not present

## 2021-07-21 DIAGNOSIS — Z20822 Contact with and (suspected) exposure to covid-19: Secondary | ICD-10-CM | POA: Diagnosis not present

## 2021-07-23 DIAGNOSIS — Z1152 Encounter for screening for COVID-19: Secondary | ICD-10-CM | POA: Diagnosis not present

## 2021-07-23 DIAGNOSIS — Z20828 Contact with and (suspected) exposure to other viral communicable diseases: Secondary | ICD-10-CM | POA: Diagnosis not present

## 2021-07-24 ENCOUNTER — Other Ambulatory Visit: Payer: Self-pay | Admitting: Family Medicine

## 2021-08-04 ENCOUNTER — Ambulatory Visit (INDEPENDENT_AMBULATORY_CARE_PROVIDER_SITE_OTHER): Payer: Medicare Other | Admitting: Family Medicine

## 2021-08-04 ENCOUNTER — Ambulatory Visit (INDEPENDENT_AMBULATORY_CARE_PROVIDER_SITE_OTHER): Payer: Medicare Other

## 2021-08-04 ENCOUNTER — Encounter: Payer: Self-pay | Admitting: Family Medicine

## 2021-08-04 VITALS — BP 100/63 | HR 88 | Ht 72.0 in | Wt 200.0 lb

## 2021-08-04 DIAGNOSIS — F3341 Major depressive disorder, recurrent, in partial remission: Secondary | ICD-10-CM | POA: Diagnosis not present

## 2021-08-04 DIAGNOSIS — J441 Chronic obstructive pulmonary disease with (acute) exacerbation: Secondary | ICD-10-CM

## 2021-08-04 DIAGNOSIS — I10 Essential (primary) hypertension: Secondary | ICD-10-CM

## 2021-08-04 DIAGNOSIS — E1165 Type 2 diabetes mellitus with hyperglycemia: Secondary | ICD-10-CM

## 2021-08-04 DIAGNOSIS — R062 Wheezing: Secondary | ICD-10-CM | POA: Diagnosis not present

## 2021-08-04 DIAGNOSIS — J449 Chronic obstructive pulmonary disease, unspecified: Secondary | ICD-10-CM

## 2021-08-04 DIAGNOSIS — R0989 Other specified symptoms and signs involving the circulatory and respiratory systems: Secondary | ICD-10-CM

## 2021-08-04 LAB — POCT GLYCOSYLATED HEMOGLOBIN (HGB A1C): HbA1c, POC (controlled diabetic range): 6.3 % (ref 0.0–7.0)

## 2021-08-04 IMAGING — DX DG CHEST 2V
3 series · 3 of 3 positions shown · non-contrast
Comparison: Radiograph [DATE], CT [DATE]

CLINICAL DATA: Chest congestion and wheezing. Chronic obstructive
pulmonary disease.

EXAM:
CHEST - 2 VIEW

[chest pa (1 of 2)]
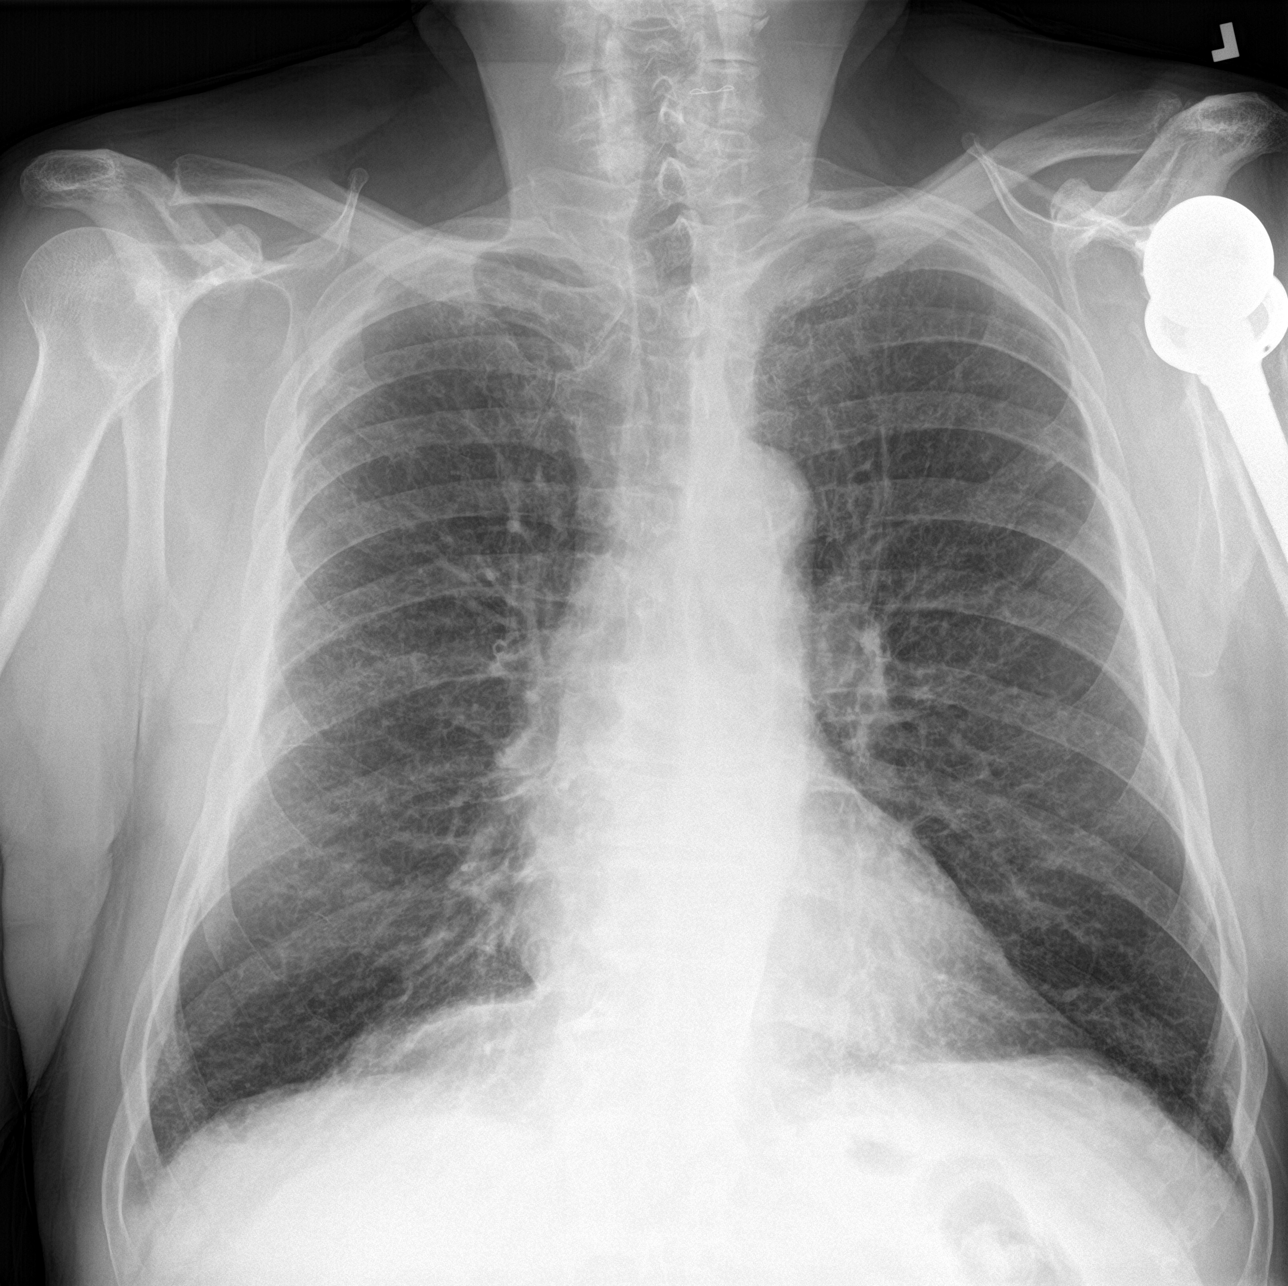

[chest lat]
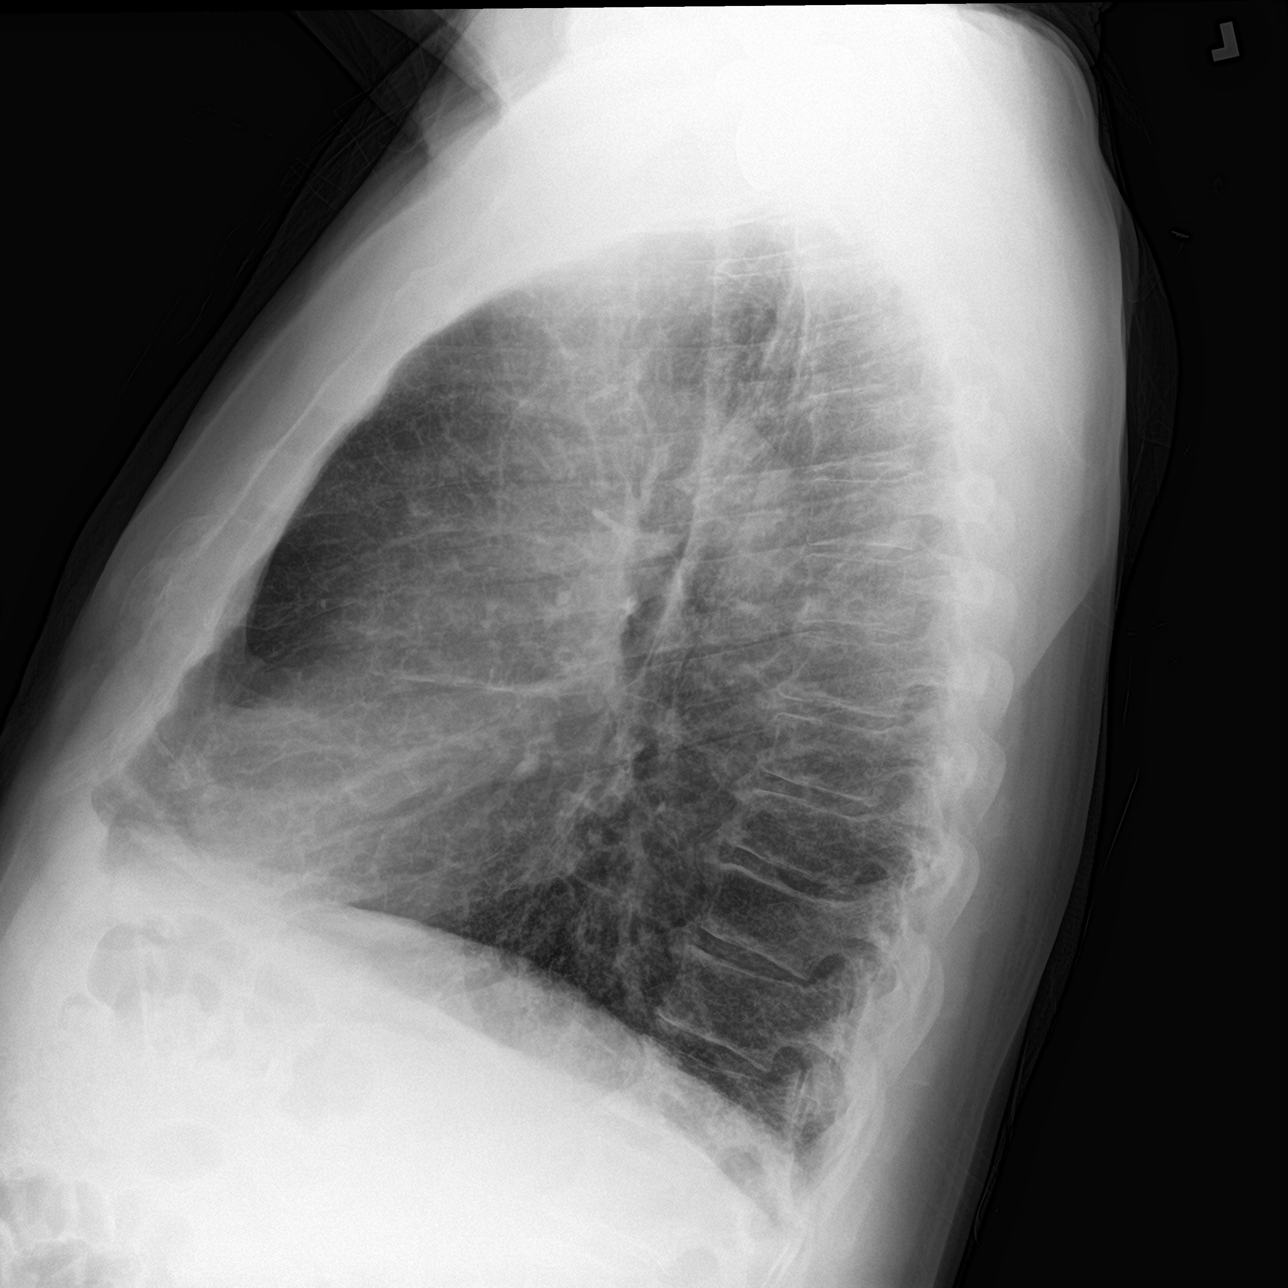

[chest pa (2 of 2)]
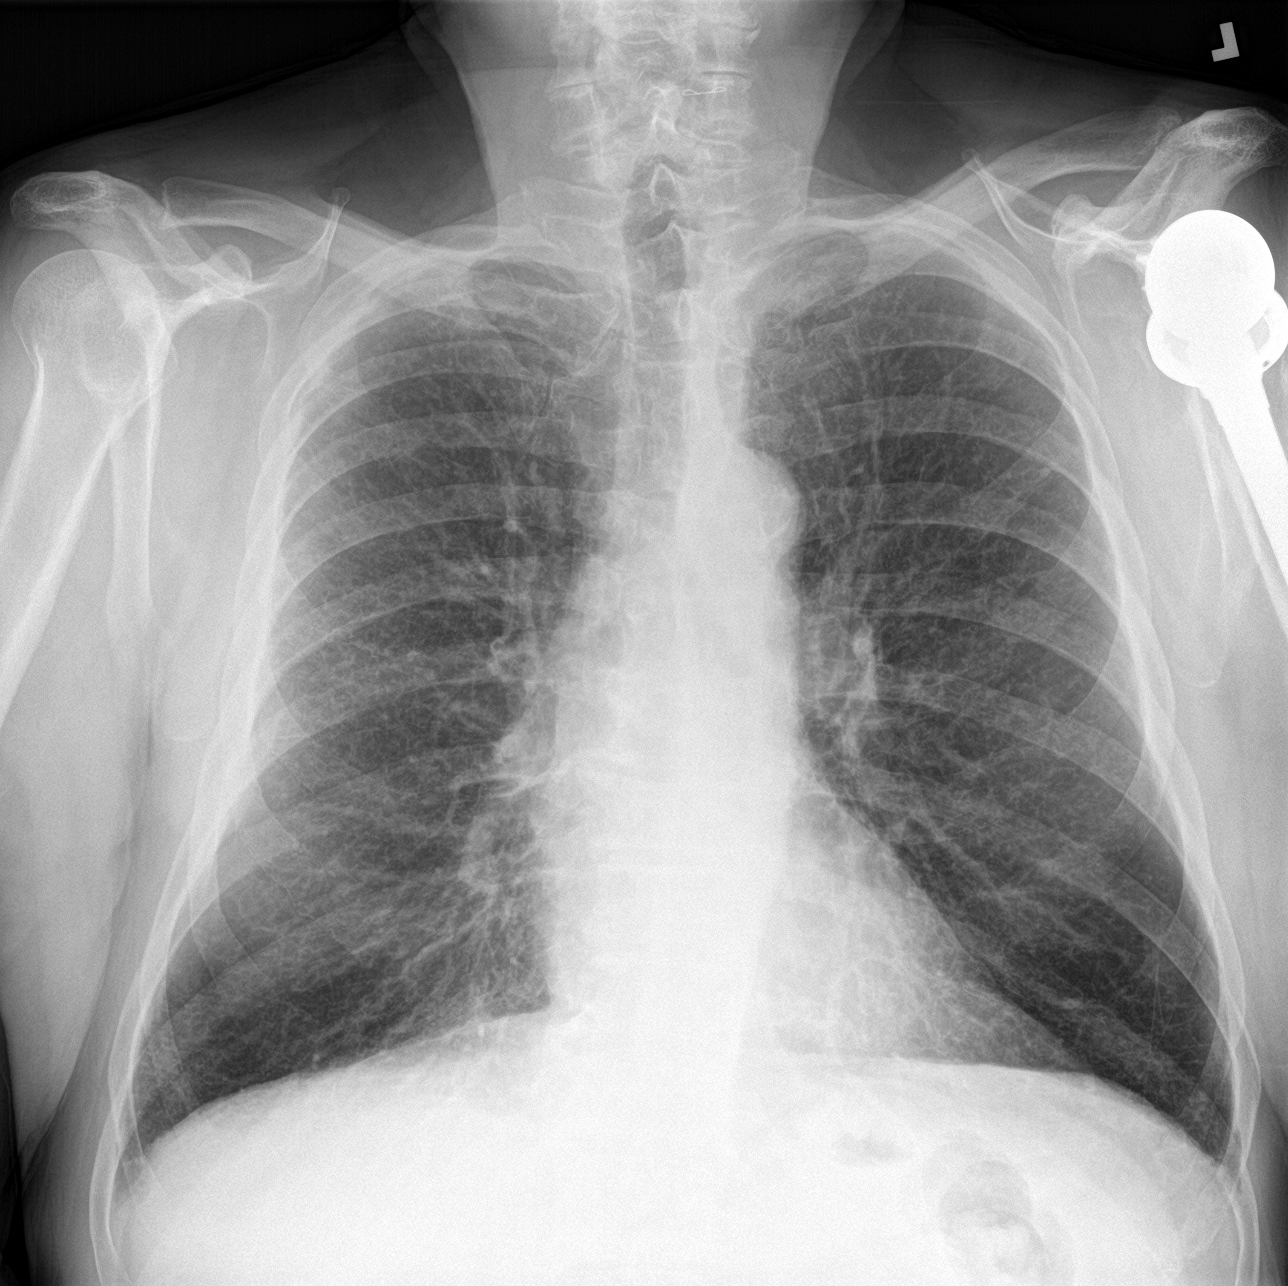

[3 of 3 positions shown; findings below may reference images not displayed]

FINDINGS: Chronic hyperinflation, bronchial thickening and interstitial
coarsening consistent with COPD. There is no focal airspace disease.
No evidence of pulmonary nodule or mass. The heart is normal in
size. Normal mediastinal contours with aortic atherosclerosis. No
pulmonary edema, pleural effusion, or pneumothorax. Mild thoracic
spondylosis. Left shoulder arthroplasty.
IMPRESSION: Chronic hyperinflation and bronchial thickening consistent with
COPD. No superimposed acute abnormality.

## 2021-08-04 MED ORDER — BUPROPION HCL ER (XL) 150 MG PO TB24: 150.0000 mg | ORAL_TABLET | Freq: Every day | ORAL | 1 refills | Status: DC

## 2021-08-04 NOTE — Assessment & Plan Note (Addendum)
Continue bupropion, will decrease from 300 to 150 mg due to some increased anxiety and irritability. ?

## 2021-08-04 NOTE — Progress Notes (Signed)
?MEDFORD STAHELI - 71 y.o. male MRN [id number]  Date of birth: 08/30/1950 ? ?Subjective ?Chief Complaint  ?Patient presents with  ? chest congestion  ? ? ?HPI ?Frederick Guzman is a 71 year old male here today for follow-up visit.  Since his last visit he was seen at urgent care x2 for chest congestion and COPD exacerbation.  Initially treated with prednisone and Augmentin, returned a month later and treated with doxycycline.  He is still continuing to cough of increased sputum.  Notes some wheezing without significant dyspnea.  He is using Breztri daily with albuterol as needed.  He does typically use Breztri after a neb treatment. ? ?Continues on Lantus for management of diabetes.  He did stop glipizide as he was having some low blood sugars.  He is interested in a continuous glucose monitor due to his episodes of hypoglycemia.  He does stay pretty active but has had hypoglycemic episodes without warning.  He feels like a continuous glucose monitor would help him manage his blood sugars better. ? ?Blood pressures have remained well controlled with current medications.  Tolerating well without side effects.  Denies chest pain or dyspnea. ? ?ROS:  A comprehensive ROS was completed and negative except as noted per HPI ? ?Allergies  ?Allergen Reactions  ? Methotrexate Derivatives Nausea And Vomiting and Other (See Comments)  ?  Hallucinations and aggressive behavior  ?  ? Tramadol Nausea And Vomiting  ?  Hallucinations and aggressive behavior   ? ? ?Past Medical History:  ?Diagnosis Date  ? Allergy   ? Arthritis   ? OA  ? Asthma   ? Cancer ([place]) 08/2014  ? melanoma on R shoulder & back - [facility]   ? COPD (chronic obstructive pulmonary disease) ([place])   ? Depression   ? pt. denies- 01/2016, pt on Wellbutrin for smoking cessation  ? Diabetes mellitus without complication ([place])   ? Diverticulitis   ? Dyspnea   ? with exertion  ? Fibromyalgia   ? GERD (gastroesophageal reflux disease)   ? Hyperlipemia   ? Hypertension   ? "never  was high"  ? Lymphocytic colitis 04/2018  ? Pneumonia 06/2015, 10/2016  ? Sleep apnea   ? last study - Nov. 2017, waiting for a new machine, using the old CPAP q night after cleaning the device after he was documented to have pneumonia from his CPAP machine.   ? ? ?Past Surgical History:  ?Procedure Laterality Date  ? carpel tunnel Bilateral   ? COLONOSCOPY W/ POLYPECTOMY    ? COLONOSCOPY W/ POLYPECTOMY    ? EXCISIONAL TOTAL SHOULDER ARTHROPLASTY WITH ANTIBIOTIC SPACER Left 08/09/2016  ? Procedure: LEFT SHOULDER HARDWARE REMOVAL, IRRIGATION AND DEBRIDEMENT, PLACEMENT OF ANTIBIOTIC SPACER;  Surgeon: Netta Cedars, MD;  Location: [facility];  Service: Orthopedics;  Laterality: Left;  ? IRRIGATION AND DEBRIDEMENT SHOULDER Left 03/31/2016  ? Procedure: IRRIGATION AND DEBRIDEMENT SHOULDER, deep cultures and polyethylene exchange;  Surgeon: Netta Cedars, MD;  Location: [facility];  Service: Orthopedics;  Laterality: Left;  requests 62mns  ? REVERSE SHOULDER ARTHROPLASTY Left 01/30/2016  ? Procedure: LEFT REVERSE SHOULDER ARTHROPLASTY;  Surgeon: SNetta Cedars MD;  [place] [facility]  Service: Orthopedics;  Laterality: Left;  ? ROTATOR CUFF REPAIR Left   ? SHOULDER ARTHROSCOPY WITH DISTAL CLAVICLE RESECTION Right 01/30/2018  ? Procedure: RIGHT SHOULDER ARTHROSCOPY WITH OPEN DISTAL CLAVICLE RESECTION, SUBACROMIAL DECOMPRESSION;  Surgeon: NNetta Cedars MD;  [facility] [facility]  Service: Orthopedics;  Laterality: Right;  ? STERIOD INJECTION Right 01/30/2018  ?  Procedure: STEROID INJECTION;  Surgeon: Netta Cedars, MD;  Location: [facility];  Service: Orthopedics;  Laterality: Right;  ? TOTAL SHOULDER REVISION Left 01/17/2017  ? Procedure: LEFT SHOULDER REVISION REVERSE TOTAL SHOULDER ARTHROPLASTY;  Surgeon: Netta Cedars, MD;  Location: [facility];  Service: Orthopedics;  Laterality: Left;  ? WRIST SURGERY Right 11/2014  ? Dr. Amedeo Plenty  ? ? ?Social History  ? ?Socioeconomic History  ? Marital status: Soil scientist  ?  Spouse name: Webb Silversmith  ? Number of  children: 2  ? Years of education: 68  ? Highest education level: 12th grade  ?Occupational History  ? Occupation: lineman  ?  Comment: retired  ?Tobacco Use  ? Smoking status: Every Day  ?  Packs/day: 0.75  ?  Years: 43.00  ?  Pack years: 32.25  ?  Types: Cigarettes  ? Smokeless tobacco: Never  ? Tobacco comments:  ?  pt no longer taking chantix  ?Vaping Use  ? Vaping Use: Never used  ?Substance and Sexual Activity  ? Alcohol use: Yes  ?  Alcohol/week: 9.0 standard drinks  ?  Types: 6 Cans of beer, 3 Shots of liquor per week  ?  Comment: beer or mixed drink   ? Drug use: No  ? Sexual activity: Yes  ?Other Topics Concern  ? Not on file  ?Social History Narrative  ? Lives with his girlfriend, Webb Silversmith. Retired. DOes a lot of yard work and handy work. Members of the camping club.  ? ?Social Determinants of Health  ? ?Financial Resource Strain: Low Risk   ? Difficulty of Paying Living Expenses: Not hard at all  ?Food Insecurity: No Food Insecurity  ? Worried About Charity fundraiser in the Last Year: Never true  ? Ran Out of Food in the Last Year: Never true  ?Transportation Needs: No Transportation Needs  ? Lack of Transportation (Medical): No  ? Lack of Transportation (Non-Medical): No  ?Physical Activity: Sufficiently Active  ? Days of Exercise per Week: 4 days  ? Minutes of Exercise per Session: 60 min  ?Stress: No Stress Concern Present  ? Feeling of Stress : Not at all  ?Social Connections: Socially Integrated  ? Frequency of Communication with Friends and Family: More than three times a week  ? Frequency of Social Gatherings with Friends and Family: Three times a week  ? Attends Religious Services: More than 4 times per year  ? Active Member of Clubs or Organizations: Yes  ? Attends Archivist Meetings: 1 to 4 times per year  ? Marital Status: Living with partner  ? ? ?Family History  ?Problem Relation Age of Onset  ? Diabetes Mother   ? Colon cancer Father   ? Prostate cancer Father   ? Diabetes Father    ? Esophageal cancer Neg Hx   ? Rectal cancer Neg Hx   ? Stomach cancer Neg Hx   ? ? ?Health Maintenance  ?Topic Date Due  ? COVID-19 Vaccine (4 - Booster for Pfizer series) 02/27/2020  ? TETANUS/TDAP  05/30/2021  ? Hepatitis C Screening  08/04/2021 (Originally 07/29/1968)  ? INFLUENZA VACCINE  10/20/2021  ? OPHTHALMOLOGY EXAM  12/15/2021  ? HEMOGLOBIN A1C  02/04/2022  ? FOOT EXAM  03/31/2022  ? COLONOSCOPY (Pts 45-93yr Insurance coverage will need to be confirmed)  05/11/2025  ? Pneumonia Vaccine 72 Years old  Completed  ? Zoster Vaccines- Shingrix  Completed  ? HPV VACCINES  Aged Out  ? ? ? ?----------------------------------------------------------------------------------------------------------------------------------------------------------------------------------------------------------------- ?Physical Exam ?  BP 100/63 (BP Location: Right Arm, Patient Position: Sitting, Cuff Size: Large)   Pulse 88   Ht 6' (1.829 m)   Wt 200 lb (90.7 kg)   SpO2 95%   BMI 27.12 kg/m?  ? ?Physical Exam ?Constitutional:   ?   Appearance: Normal appearance.  ?Eyes:  ?   General: No scleral icterus. ?Cardiovascular:  ?   Rate and Rhythm: Normal rate and regular rhythm.  ?Pulmonary:  ?   Effort: Pulmonary effort is normal.  ?   Comments: Scattered wheezing and rhonchi. ?Musculoskeletal:  ?   Cervical back: Neck supple.  ?Neurological:  ?   General: No focal deficit present.  ?   Mental Status: He is alert.  ?Psychiatric:     ?   Mood and Affect: Mood normal.     ?   Behavior: Behavior normal.  ? ? ?------------------------------------------------------------------------------------------------------------------------------------------------------------------------------------------------------------------- ?Assessment and Plan ? ?HTN (hypertension) ?Blood pressures well controlled at this time.  Recommend continuation of current medications for management of hypertension. ? ?COPD with exacerbation ([place]) ?Continues to have some  wheezing and rhonchi.  Chest x-ray ordered.  Recommend restarting Mucinex.  Continue current inhalers. ? ?Uncontrolled type 2 diabetes mellitus with hyperglycemia, without long-term current use of insulin ([facility]

## 2021-08-04 NOTE — Assessment & Plan Note (Signed)
Diabetes is better controlled however he is having episodes of hypoglycemia which are symptomatic.  I do think he would benefit from continuous glucose monitor.  Provided sample of freestyle libre and will send the prescription to DME company.  He will stay off of glipizide at this time. ?

## 2021-08-04 NOTE — Patient Instructions (Signed)
Add mucinex back on.  ?We'll be in touch with xray results.  ?Follow up ion 3-4 months.  ?

## 2021-08-04 NOTE — Assessment & Plan Note (Signed)
Blood pressures well controlled at this time.  Recommend continuation of current medications for management of hypertension. 

## 2021-08-04 NOTE — Assessment & Plan Note (Signed)
Continues to have some wheezing and rhonchi.  Chest x-ray ordered.  Recommend restarting Mucinex.  Continue current inhalers. ?

## 2021-08-05 ENCOUNTER — Ambulatory Visit (INDEPENDENT_AMBULATORY_CARE_PROVIDER_SITE_OTHER): Payer: Medicare Other | Admitting: Family Medicine

## 2021-08-05 DIAGNOSIS — Z0000 Encounter for general adult medical examination without abnormal findings: Secondary | ICD-10-CM | POA: Diagnosis not present

## 2021-08-05 NOTE — Patient Instructions (Signed)
?MEDICARE ANNUAL WELLNESS VISIT ?Health Maintenance Summary and Written Plan of Care ? ?Frederick Guzman , ? ?Thank you for allowing me to perform your Medicare Annual Wellness Visit and for your ongoing commitment to your health.  ? ?Health Maintenance & Immunization History ?Health Maintenance  ?Topic Date Due  ? COVID-19 Vaccine (4 - Booster for [place] series) 08/21/2021 (Originally 02/27/2020)  ? TETANUS/TDAP  08/06/2022 (Originally 05/30/2021)  ? Hepatitis C Screening  08/06/2022 (Originally 07/29/1968)  ? INFLUENZA VACCINE  10/20/2021  ? OPHTHALMOLOGY EXAM  12/15/2021  ? HEMOGLOBIN A1C  02/04/2022  ? FOOT EXAM  03/31/2022  ? COLONOSCOPY (Pts 45-69yr Insurance coverage will need to be confirmed)  05/11/2025  ? Pneumonia Vaccine 72 Years old  Completed  ? Zoster Vaccines- Shingrix  Completed  ? HPV VACCINES  Aged Out  ? ?Immunization History  ?Administered Date(s) Administered  ? Fluad Quad(high Dose 65+) 11/14/2018  ? Influenza, High Dose Seasonal PF 12/15/2016, 01/19/2018  ? Influenza, Seasonal, Injecte, Preservative Fre 11/27/2015  ? Influenza-Unspecified 02/14/2012, 12/27/2012, 12/19/2019  ? PFIZER(Purple Top)SARS-COV-2 Vaccination 04/13/2019, 05/04/2019, 01/02/2020  ? Pneumococcal Conjugate-13 03/18/2015  ? Pneumococcal Polysaccharide-23 01/19/2008, 12/15/2016  ? Tdap 05/31/2011  ? Zoster Recombinat (Shingrix) 02/19/2019, 03/20/2019  ? ? ?These are the patient goals that we discussed: ? Goals Addressed   ? ?  ?  ?  ?  ?  ? This Visit's Progress  ?   Patient Stated (pt-stated)     ?   Keep his A1C below 6.5. He would also like to loose a few lbs. ?  ? ?  ?  ? ?This is a list of Health Maintenance Items that are overdue or due now: ?There are no preventive care reminders to display for this patient.  ? ?Orders/Referrals Placed Today: ?No orders of the defined types were placed in this encounter. ? ?(Contact our referral department at [phone] you have not spoken with someone about your referral  appointment within the next 5 days)  ? ? ?Follow-up Plan ?Follow-up with MLuetta Nutting DO as planned ?Medicare wellness visit in one year.  ?Patient will access AVS on my chart. ? ? ? ?  ?Health Maintenance, Male ?Adopting a healthy lifestyle and getting preventive care are important in promoting health and wellness. Ask your health care provider about: ?The right schedule for you to have regular tests and exams. ?Things you can do on your own to prevent diseases and keep yourself healthy. ?What should I know about diet, weight, and exercise? ?Eat a healthy diet ? ?Eat a diet that includes plenty of vegetables, fruits, low-fat dairy products, and lean protein. ?Do not eat a lot of foods that are high in solid fats, added sugars, or sodium. ?Maintain a healthy weight ?Body mass index (BMI) is a measurement that can be used to identify possible weight problems. It estimates body fat based on height and weight. Your health care provider can help determine your BMI and help you achieve or maintain a healthy weight. ?Get regular exercise ?Get regular exercise. This is one of the most important things you can do for your health. Most adults should: ?Exercise for at least 150 minutes each week. The exercise should increase your heart rate and make you sweat (moderate-intensity exercise). ?Do strengthening exercises at least twice a week. This is in addition to the moderate-intensity exercise. ?Spend less time sitting. Even light physical activity can be beneficial. ?Watch cholesterol and blood lipids ?Have your blood tested for lipids and cholesterol at 72 years  of age, then have this test every 5 years. ?You may need to have your cholesterol levels checked more often if: ?Your lipid or cholesterol levels are high. ?You are older than 72 years of age. ?You are at high risk for heart disease. ?What should I know about cancer screening? ?Many types of cancers can be detected early and may often be prevented. Depending on  your health history and family history, you may need to have cancer screening at various ages. This may include screening for: ?Colorectal cancer. ?Prostate cancer. ?Skin cancer. ?Lung cancer. ?What should I know about heart disease, diabetes, and high blood pressure? ?Blood pressure and heart disease ?High blood pressure causes heart disease and increases the risk of stroke. This is more likely to develop in people who have high blood pressure readings or are overweight. ?Talk with your health care provider about your target blood pressure readings. ?Have your blood pressure checked: ?Every 3-5 years if you are 67-29 years of age. ?Every year if you are 39 years old or older. ?If you are between the ages of 74 and 22 and are a current or former smoker, ask your health care provider if you should have a one-time screening for abdominal aortic aneurysm (AAA). ?Diabetes ?Have regular diabetes screenings. This checks your fasting blood sugar level. Have the screening done: ?Once every three years after age 24 if you are at a normal weight and have a low risk for diabetes. ?More often and at a younger age if you are overweight or have a high risk for diabetes. ?What should I know about preventing infection? ?Hepatitis B ?If you have a higher risk for hepatitis B, you should be screened for this virus. Talk with your health care provider to find out if you are at risk for hepatitis B infection. ?Hepatitis C ?Blood testing is recommended for: ?Everyone born from [birth year] through 1966. ?Anyone with known risk factors for hepatitis C. ?Sexually transmitted infections (STIs) ?You should be screened each year for STIs, including gonorrhea and chlamydia, if: ?You are sexually active and are younger than 72 years of age. ?You are older than 72 years of age and your health care provider tells you that you are at risk for this type of infection. ?Your sexual activity has changed since you were last screened, and you are at increased  risk for chlamydia or gonorrhea. Ask your health care provider if you are at risk. ?Ask your health care provider about whether you are at high risk for HIV. Your health care provider may recommend a prescription medicine to help prevent HIV infection. If you choose to take medicine to prevent HIV, you should first get tested for HIV. You should then be tested every 3 months for as long as you are taking the medicine. ?Follow these instructions at home: ?Alcohol use ?Do not drink alcohol if your health care provider tells you not to drink. ?If you drink alcohol: ?Limit how much you have to 0-2 drinks a day. ?Know how much alcohol is in your drink. In the [place] one drink equals one 12 oz bottle of beer (355 mL), one 5 oz glass of wine (148 mL), or one 1? oz glass of hard liquor (44 mL). ?Lifestyle ?Do not use any products that contain nicotine or tobacco. These products include cigarettes, chewing tobacco, and vaping devices, such as e-cigarettes. If you need help quitting, ask your health care provider. ?Do not use street drugs. ?Do not share needles. ?Ask your health care provider  for help if you need support or information about quitting drugs. ?General instructions ?Schedule regular health, dental, and eye exams. ?Stay current with your vaccines. ?Tell your health care provider if: ?You often feel depressed. ?You have ever been abused or do not feel safe at home. ?Summary ?Adopting a healthy lifestyle and getting preventive care are important in promoting health and wellness. ?Follow your health care provider's instructions about healthy diet, exercising, and getting tested or screened for diseases. ?Follow your health care provider's instructions on monitoring your cholesterol and blood pressure. ?This information is not intended to replace advice given to you by your health care provider. Make sure you discuss any questions you have with your health care provider. ?Document Revised: 07/28/2020 Document  Reviewed: 07/28/2020 ?Elsevier Patient Education ? [place]. ? ?

## 2021-08-05 NOTE — Progress Notes (Signed)
? ? ?MEDICARE ANNUAL WELLNESS VISIT ? ?08/05/2021 ? ?Telephone Visit Disclaimer ?This Medicare AWV was conducted by telephone due to national recommendations for restrictions regarding the COVID-19 Pandemic (e.g. social distancing).  I verified, using two identifiers, that I am speaking with Frederick Guzman or their authorized healthcare agent. I discussed the limitations, risks, security, and privacy concerns of performing an evaluation and management service by telephone and the potential availability of an in-person appointment in the future. The patient expressed understanding and agreed to proceed.  ?Location of Patient: Home ?Location of Provider (nurse):  In the office. ? ?Subjective:  ? ? ?Frederick Guzman is a 71 y.o. male patient of Frederick Nutting, DO who had a Medicare Annual Wellness Visit today via telephone. Frederick Guzman is Retired and lives with their partner. he has 2 children. he reports that he is socially active and does interact with friends/family regularly. he is minimally physically active and enjoys yard work and handy work. ? ?Patient Care Team: ?Frederick Nutting, DO as PCP - General (Family Medicine) ?Early, Coralee Pesa, NP as Nurse Practitioner (Nurse Practitioner) ? ? ?  08/05/2021  ?  9:08 AM 12/30/2020  ?  8:45 AM 08/04/2020  ?  1:43 PM 09/13/2019  ?  7:35 PM 06/26/2018  ? 11:20 AM 01/30/2018  ?  6:08 AM 10/23/2017  ?  3:00 PM  ?Advanced Directives  ?Does Patient Have a Medical Advance Directive? Yes Yes Yes No No No No  ?Type of Paramedic of [place];Living will Living will;Healthcare Power of [place];Living will      ?Does patient want to make changes to medical advance directive? No - Patient declined  No - Patient declined      ?Copy of [place] in Chart? No - copy requested No - copy requested No - copy requested      ?Would patient like information on creating a medical advance directive?  No - Patient declined   No -  Patient declined No - Patient declined No - Patient declined  ? ? ?Hospital Utilization Over the Past 12 Months: ?# of hospitalizations or ER visits: 0 ?# of surgeries: 0 ? ?Review of Systems    ?Patient reports that his overall health is unchanged compared to last year. ? ?History obtained from chart review and the patient ? ?Patient Reported Readings (BP, Pulse, CBG, Weight, etc) ?none ? ?Pain Assessment ?Pain : No/denies pain ?Pain Score: 0-No pain ? ?  ? ?Current Medications & Allergies (verified) ?Allergies as of 08/05/2021   ? ?   Reactions  ? Methotrexate Derivatives Nausea And Vomiting, Other (See Comments)  ? Hallucinations and aggressive behavior   ? Tramadol Nausea And Vomiting  ? Hallucinations and aggressive behavior   ? ?  ? ?  ?Medication List  ?  ? ?  ? Accurate as of Aug 05, 2021  9:19 AM. If you have any questions, ask your nurse or doctor.  ?  ?  ? ?  ? ?albuterol 108 (90 Base) MCG/ACT inhaler ?Commonly known as: VENTOLIN HFA ?Inhale 2 puffs into the lungs every 6 (six) hours as needed for wheezing or shortness of breath. ?  ?albuterol (2.5 MG/3ML) 0.083% nebulizer solution ?Commonly known as: PROVENTIL ?Take 2.5 mg by nebulization every 6 (six) hours as needed for wheezing or shortness of breath. ?  ?atorvastatin 20 MG tablet ?Commonly known as: LIPITOR ?Take 1 tablet (20 mg total) by mouth every other day. ?  ?  b complex vitamins tablet ?Take 1 tablet by mouth daily. ?  ?Breztri Aerosphere 160-9-4.8 MCG/ACT Aero ?Generic drug: Budeson-Glycopyrrol-Formoterol ?Inhale into the lungs. ?  ?buPROPion 150 MG 24 hr tablet ?Commonly known as: Wellbutrin XL ?Take 1 tablet (150 mg total) by mouth daily. ?  ?cetirizine 10 MG tablet ?Commonly known as: ZYRTEC ?Take 10 mg by mouth daily as needed for allergies. ?  ?esomeprazole 40 MG capsule ?Commonly known as: [place] ?Take 1 capsule (40 mg total) by mouth daily. NO REFILLS. NEEDS TO TRANSITION CARE TO NEW PCP. ?  ?Fish Oil 1000 MG Caps ?Take 1,000 mg by mouth  at bedtime. ?  ?fluticasone 50 MCG/ACT nasal spray ?Commonly known as: FLONASE ?Place 1 spray into both nostrils daily as needed for allergies. ?  ?furosemide 20 MG tablet ?Commonly known as: LASIX ?Take 1 tablet (20 mg total) by mouth 2 (two) times daily as needed for edema. ?  ?Insulin Pen Needle 32G X 4 MM Misc ?For use with Lantus Solostar ?What changed: additional instructions ?  ?Lantus SoloStar 100 UNIT/ML Solostar Pen ?Generic drug: insulin glargine ?Inject into the skin. ?  ?lisinopril 2.5 MG tablet ?Commonly known as: ZESTRIL ?Take 1 tablet (2.5 mg total) by mouth at bedtime. ?  ?multivitamin with minerals Tabs tablet ?Take 1 tablet by mouth at bedtime. ?  ?Prodigy No Coding Blood Gluc test strip ?Generic drug: glucose blood ?Use up to 4 times per day as directed with glucometer. ?  ?REFRESH OP ?Place 1 drop into both eyes daily as needed (dry eyes). ?  ?tacrolimus 0.1 % ointment ?Commonly known as: PROTOPIC ?Apply 1 application topically daily. ?  ?tadalafil 5 MG tablet ?Commonly known as: CIALIS ?Take 5 mg by mouth daily. ?  ?tamsulosin 0.4 MG Caps capsule ?Commonly known as: FLOMAX ?Take 0.4 mg by mouth. ?  ?Testosterone 30 MG/ACT Soln ?APPLY 1-2 PUMPS DAILY. ALTERNATE TAKING ONE PUMP UNDER ONE ARM ONE DAY, TWO PUMPS THE NEXT DAY (ONE PUMP UNDER EACH ARM) ?  ?vitamin C 500 MG tablet ?Commonly known as: ASCORBIC ACID ?Take 500 mg by mouth daily. ?  ? ?  ? ? ?History (reviewed): ?Past Medical History:  ?Diagnosis Date  ? Allergy   ? Arthritis   ? OA  ? Asthma   ? Cancer ([place]) 08/2014  ? melanoma on R shoulder & back - [facility]   ? COPD (chronic obstructive pulmonary disease) ([place])   ? Depression   ? pt. denies- 01/2016, pt on Wellbutrin for smoking cessation  ? Diabetes mellitus without complication ([place])   ? Diverticulitis   ? Dyspnea   ? with exertion  ? Fibromyalgia   ? GERD (gastroesophageal reflux disease)   ? Hyperlipemia   ? Hypertension   ? "never was high"  ? Lymphocytic colitis 04/2018  ?  Pneumonia 06/2015, 10/2016  ? Sleep apnea   ? last study - Nov. 2017, waiting for a new machine, using the old CPAP q night after cleaning the device after he was documented to have pneumonia from his CPAP machine.   ? ?Past Surgical History:  ?Procedure Laterality Date  ? carpel tunnel Bilateral   ? COLONOSCOPY W/ POLYPECTOMY    ? COLONOSCOPY W/ POLYPECTOMY    ? EXCISIONAL TOTAL SHOULDER ARTHROPLASTY WITH ANTIBIOTIC SPACER Left 08/09/2016  ? Procedure: LEFT SHOULDER HARDWARE REMOVAL, IRRIGATION AND DEBRIDEMENT, PLACEMENT OF ANTIBIOTIC SPACER;  Surgeon: Netta Cedars, MD;  Location: [facility];  Service: Orthopedics;  Laterality: Left;  ? IRRIGATION AND DEBRIDEMENT SHOULDER Left  03/31/2016  ? Procedure: IRRIGATION AND DEBRIDEMENT SHOULDER, deep cultures and polyethylene exchange;  Surgeon: Netta Cedars, MD;  Location: [facility];  Service: Orthopedics;  Laterality: Left;  requests 66mns  ? REVERSE SHOULDER ARTHROPLASTY Left 01/30/2016  ? Procedure: LEFT REVERSE SHOULDER ARTHROPLASTY;  Surgeon: SNetta Cedars MD;  [facility] [facility]  Service: Orthopedics;  Laterality: Left;  ? ROTATOR CUFF REPAIR Left   ? SHOULDER ARTHROSCOPY WITH DISTAL CLAVICLE RESECTION Right 01/30/2018  ? Procedure: RIGHT SHOULDER ARTHROSCOPY WITH OPEN DISTAL CLAVICLE RESECTION, SUBACROMIAL DECOMPRESSION;  Surgeon: NNetta Cedars MD;  [facility] [facility]  Service: Orthopedics;  Laterality: Right;  ? STERIOD INJECTION Right 01/30/2018  ? Procedure: STEROID INJECTION;  Surgeon: NNetta Cedars MD;  [facility]  Service: Orthopedics;  Laterality: Right;  ? TOTAL SHOULDER REVISION Left 01/17/2017  ? Procedure: LEFT SHOULDER REVISION REVERSE TOTAL SHOULDER ARTHROPLASTY;  Surgeon: NNetta Cedars MD;  [facility]  Service: Orthopedics;  Laterality: Left;  ? WRIST SURGERY Right 11/2014  ? Dr. GAmedeo Plenty ? ?Family History  ?Problem Relation Age of Onset  ? Diabetes Mother   ? Colon cancer Father   ? Prostate cancer Father   ? Diabetes Father   ? Esophageal cancer Neg  Hx   ? Rectal cancer Neg Hx   ? Stomach cancer Neg Hx   ? ?Social History  ? ?Socioeconomic History  ? Marital status: DSoil scientist ?  Spouse name: AWebb Silversmith ? Number of children: 2  ? Years of education: 1

## 2021-08-11 DIAGNOSIS — F17210 Nicotine dependence, cigarettes, uncomplicated: Secondary | ICD-10-CM | POA: Diagnosis not present

## 2021-08-11 DIAGNOSIS — J449 Chronic obstructive pulmonary disease, unspecified: Secondary | ICD-10-CM | POA: Diagnosis not present

## 2021-08-11 DIAGNOSIS — G4733 Obstructive sleep apnea (adult) (pediatric): Secondary | ICD-10-CM | POA: Diagnosis not present

## 2021-08-20 ENCOUNTER — Other Ambulatory Visit: Payer: Self-pay | Admitting: Physician Assistant

## 2021-08-31 ENCOUNTER — Emergency Department (HOSPITAL_COMMUNITY): Payer: Medicare Other

## 2021-08-31 ENCOUNTER — Other Ambulatory Visit: Payer: Self-pay

## 2021-08-31 ENCOUNTER — Encounter (HOSPITAL_COMMUNITY)
Admission: EM | Disposition: A | Discharge status: Home / Self Care | Payer: Self-pay | Source: Home / Self Care | Attending: Student

## 2021-08-31 ENCOUNTER — Observation Stay (HOSPITAL_COMMUNITY)
Admission: EM | Admit: 2021-08-31 | Discharge: 2021-09-01 | Disposition: A | Discharge status: Home / Self Care | Payer: Medicare Other | Source: Home / Self Care | Attending: Student | Admitting: Student

## 2021-08-31 ENCOUNTER — Emergency Department (HOSPITAL_BASED_OUTPATIENT_CLINIC_OR_DEPARTMENT_OTHER): Payer: Medicare Other | Admitting: Certified Registered"

## 2021-08-31 ENCOUNTER — Emergency Department (HOSPITAL_COMMUNITY): Payer: Medicare Other | Admitting: Certified Registered"

## 2021-08-31 ENCOUNTER — Emergency Department (INDEPENDENT_AMBULATORY_CARE_PROVIDER_SITE_OTHER)
Admission: EM | Admit: 2021-08-31 | Discharge: 2021-08-31 | Disposition: A | Discharge status: Home / Self Care | Payer: Medicare Other | Source: Home / Self Care

## 2021-08-31 ENCOUNTER — Encounter (HOSPITAL_COMMUNITY): Payer: Self-pay

## 2021-08-31 DIAGNOSIS — Z79899 Other long term (current) drug therapy: Secondary | ICD-10-CM | POA: Insufficient documentation

## 2021-08-31 DIAGNOSIS — M62838 Other muscle spasm: Secondary | ICD-10-CM | POA: Diagnosis not present

## 2021-08-31 DIAGNOSIS — R110 Nausea: Secondary | ICD-10-CM | POA: Diagnosis not present

## 2021-08-31 DIAGNOSIS — K812 Acute cholecystitis with chronic cholecystitis: Secondary | ICD-10-CM | POA: Diagnosis not present

## 2021-08-31 DIAGNOSIS — K811 Chronic cholecystitis: Secondary | ICD-10-CM | POA: Diagnosis not present

## 2021-08-31 DIAGNOSIS — K82A1 Gangrene of gallbladder in cholecystitis: Secondary | ICD-10-CM | POA: Diagnosis not present

## 2021-08-31 DIAGNOSIS — Z885 Allergy status to narcotic agent status: Secondary | ICD-10-CM | POA: Diagnosis not present

## 2021-08-31 DIAGNOSIS — E119 Type 2 diabetes mellitus without complications: Secondary | ICD-10-CM | POA: Diagnosis not present

## 2021-08-31 DIAGNOSIS — J441 Chronic obstructive pulmonary disease with (acute) exacerbation: Secondary | ICD-10-CM | POA: Insufficient documentation

## 2021-08-31 DIAGNOSIS — E785 Hyperlipidemia, unspecified: Secondary | ICD-10-CM | POA: Diagnosis present

## 2021-08-31 DIAGNOSIS — Z794 Long term (current) use of insulin: Secondary | ICD-10-CM | POA: Insufficient documentation

## 2021-08-31 DIAGNOSIS — F17210 Nicotine dependence, cigarettes, uncomplicated: Secondary | ICD-10-CM

## 2021-08-31 DIAGNOSIS — I10 Essential (primary) hypertension: Secondary | ICD-10-CM | POA: Diagnosis not present

## 2021-08-31 DIAGNOSIS — K429 Umbilical hernia without obstruction or gangrene: Secondary | ICD-10-CM | POA: Diagnosis not present

## 2021-08-31 DIAGNOSIS — K219 Gastro-esophageal reflux disease without esophagitis: Secondary | ICD-10-CM | POA: Diagnosis present

## 2021-08-31 DIAGNOSIS — E1165 Type 2 diabetes mellitus with hyperglycemia: Secondary | ICD-10-CM | POA: Diagnosis present

## 2021-08-31 DIAGNOSIS — J449 Chronic obstructive pulmonary disease, unspecified: Secondary | ICD-10-CM | POA: Diagnosis not present

## 2021-08-31 DIAGNOSIS — J45909 Unspecified asthma, uncomplicated: Secondary | ICD-10-CM | POA: Insufficient documentation

## 2021-08-31 DIAGNOSIS — K828 Other specified diseases of gallbladder: Secondary | ICD-10-CM | POA: Diagnosis not present

## 2021-08-31 DIAGNOSIS — K819 Cholecystitis, unspecified: Secondary | ICD-10-CM

## 2021-08-31 DIAGNOSIS — N281 Cyst of kidney, acquired: Secondary | ICD-10-CM | POA: Diagnosis not present

## 2021-08-31 DIAGNOSIS — K659 Peritonitis, unspecified: Secondary | ICD-10-CM

## 2021-08-31 DIAGNOSIS — R1011 Right upper quadrant pain: Secondary | ICD-10-CM | POA: Diagnosis not present

## 2021-08-31 DIAGNOSIS — R918 Other nonspecific abnormal finding of lung field: Secondary | ICD-10-CM | POA: Diagnosis not present

## 2021-08-31 DIAGNOSIS — K5730 Diverticulosis of large intestine without perforation or abscess without bleeding: Secondary | ICD-10-CM | POA: Diagnosis not present

## 2021-08-31 DIAGNOSIS — Z888 Allergy status to other drugs, medicaments and biological substances status: Secondary | ICD-10-CM | POA: Diagnosis not present

## 2021-08-31 DIAGNOSIS — Z96612 Presence of left artificial shoulder joint: Secondary | ICD-10-CM | POA: Insufficient documentation

## 2021-08-31 DIAGNOSIS — K5900 Constipation, unspecified: Secondary | ICD-10-CM | POA: Diagnosis not present

## 2021-08-31 DIAGNOSIS — Z96642 Presence of left artificial hip joint: Secondary | ICD-10-CM | POA: Diagnosis not present

## 2021-08-31 DIAGNOSIS — N400 Enlarged prostate without lower urinary tract symptoms: Secondary | ICD-10-CM | POA: Diagnosis present

## 2021-08-31 DIAGNOSIS — R748 Abnormal levels of other serum enzymes: Secondary | ICD-10-CM | POA: Diagnosis not present

## 2021-08-31 DIAGNOSIS — G4733 Obstructive sleep apnea (adult) (pediatric): Secondary | ICD-10-CM | POA: Diagnosis present

## 2021-08-31 DIAGNOSIS — Z7951 Long term (current) use of inhaled steroids: Secondary | ICD-10-CM | POA: Diagnosis not present

## 2021-08-31 DIAGNOSIS — R109 Unspecified abdominal pain: Secondary | ICD-10-CM | POA: Diagnosis not present

## 2021-08-31 DIAGNOSIS — K810 Acute cholecystitis: Secondary | ICD-10-CM | POA: Diagnosis not present

## 2021-08-31 DIAGNOSIS — K869 Disease of pancreas, unspecified: Secondary | ICD-10-CM | POA: Diagnosis not present

## 2021-08-31 DIAGNOSIS — E1169 Type 2 diabetes mellitus with other specified complication: Secondary | ICD-10-CM | POA: Diagnosis present

## 2021-08-31 DIAGNOSIS — R1084 Generalized abdominal pain: Secondary | ICD-10-CM | POA: Diagnosis not present

## 2021-08-31 DIAGNOSIS — M797 Fibromyalgia: Secondary | ICD-10-CM | POA: Diagnosis present

## 2021-08-31 DIAGNOSIS — K8000 Calculus of gallbladder with acute cholecystitis without obstruction: Secondary | ICD-10-CM | POA: Diagnosis not present

## 2021-08-31 DIAGNOSIS — Z85828 Personal history of other malignant neoplasm of skin: Secondary | ICD-10-CM | POA: Insufficient documentation

## 2021-08-31 DIAGNOSIS — I700 Atherosclerosis of aorta: Secondary | ICD-10-CM | POA: Diagnosis not present

## 2021-08-31 DIAGNOSIS — R17 Unspecified jaundice: Secondary | ICD-10-CM | POA: Diagnosis not present

## 2021-08-31 DIAGNOSIS — R7401 Elevation of levels of liver transaminase levels: Secondary | ICD-10-CM | POA: Diagnosis not present

## 2021-08-31 DIAGNOSIS — M1990 Unspecified osteoarthritis, unspecified site: Secondary | ICD-10-CM | POA: Diagnosis present

## 2021-08-31 DIAGNOSIS — K760 Fatty (change of) liver, not elsewhere classified: Secondary | ICD-10-CM | POA: Diagnosis not present

## 2021-08-31 DIAGNOSIS — K829 Disease of gallbladder, unspecified: Secondary | ICD-10-CM | POA: Diagnosis not present

## 2021-08-31 DIAGNOSIS — Z85820 Personal history of malignant melanoma of skin: Secondary | ICD-10-CM | POA: Diagnosis not present

## 2021-08-31 HISTORY — PX: UMBILICAL HERNIA REPAIR: SHX196

## 2021-08-31 HISTORY — PX: CHOLECYSTECTOMY: SHX55

## 2021-08-31 LAB — COMPREHENSIVE METABOLIC PANEL
ALT: 23 U/L (ref 0–44)
AST: 16 U/L (ref 15–41)
Albumin: 3.4 g/dL — ABNORMAL LOW (ref 3.5–5.0)
Alkaline Phosphatase: 61 U/L (ref 38–126)
Anion gap: 7 (ref 5–15)
BUN: 10 mg/dL (ref 8–23)
CO2: 29 mmol/L (ref 22–32)
Calcium: 8.6 mg/dL — ABNORMAL LOW (ref 8.9–10.3)
Chloride: 100 mmol/L (ref 98–111)
Creatinine, Ser: 1.12 mg/dL (ref 0.61–1.24)
GFR, Estimated: >60 mL/min (ref >60)
Glucose, Bld: 136 mg/dL — ABNORMAL HIGH (ref 70–99)
Potassium: 4.2 mmol/L (ref 3.5–5.1)
Sodium: 136 mmol/L (ref 135–145)
Total Bilirubin: 1.3 mg/dL — ABNORMAL HIGH (ref 0.3–1.2)
Total Protein: 6.8 g/dL (ref 6.5–8.1)

## 2021-08-31 LAB — URINALYSIS, ROUTINE W REFLEX MICROSCOPIC
Bacteria, UA: NONE SEEN
Bilirubin Urine: NEGATIVE
Glucose, UA: NEGATIVE mg/dL
Hgb urine dipstick: NEGATIVE
Ketones, ur: 5 mg/dL — AB
Leukocytes,Ua: NEGATIVE
Nitrite: NEGATIVE
Protein, ur: 30 mg/dL — AB
Specific Gravity, Urine: 1.02 (ref 1.005–1.030)
pH: 7 (ref 5.0–8.0)

## 2021-08-31 LAB — CBC WITH DIFFERENTIAL/PLATELET
Abs Immature Granulocytes: 0.03 10*3/uL (ref 0.00–0.07)
Basophils Absolute: 0 10*3/uL (ref 0.0–0.1)
Basophils Relative: 0 %
Eosinophils Absolute: 0.1 10*3/uL (ref 0.0–0.5)
Eosinophils Relative: 1 %
HCT: 44 % (ref 39.0–52.0)
Hemoglobin: 14.9 g/dL (ref 13.0–17.0)
Immature Granulocytes: 0 %
Lymphocytes Relative: 14 %
Lymphs Abs: 2 10*3/uL (ref 0.7–4.0)
MCH: 31.8 pg (ref 26.0–34.0)
MCHC: 33.9 g/dL (ref 30.0–36.0)
MCV: 93.8 fL (ref 80.0–100.0)
Monocytes Absolute: 1.1 10*3/uL — ABNORMAL HIGH (ref 0.1–1.0)
Monocytes Relative: 8 %
Neutro Abs: 10.5 10*3/uL — ABNORMAL HIGH (ref 1.7–7.7)
Neutrophils Relative %: 77 %
Platelets: 188 10*3/uL (ref 150–400)
RBC: 4.69 MIL/uL (ref 4.22–5.81)
RDW: 13.5 % (ref 11.5–15.5)
WBC: 13.8 10*3/uL — ABNORMAL HIGH (ref 4.0–10.5)
nRBC: 0 % (ref 0.0–0.2)

## 2021-08-31 LAB — GLUCOSE, CAPILLARY
Glucose-Capillary: 165 mg/dL — ABNORMAL HIGH (ref 70–99)
Glucose-Capillary: 166 mg/dL — ABNORMAL HIGH (ref 70–99)
Glucose-Capillary: 172 mg/dL — ABNORMAL HIGH (ref 70–99)

## 2021-08-31 LAB — LACTIC ACID, PLASMA: Lactic Acid, Venous: 1.4 mmol/L (ref 0.5–1.9)

## 2021-08-31 LAB — LIPASE, BLOOD: Lipase: 20 U/L (ref 11–51)

## 2021-08-31 IMAGING — CT CT ABD-PELV W/ CM
2 of 4 series · 16 of 46 positions shown, 18 images · IV contrast (agent unspecified)
Comparison: [DATE]

CLINICAL DATA: Abdominal pain, acute, nonlocalized

EXAM:
CT ABDOMEN AND PELVIS WITH CONTRAST
TECHNIQUE: Multidetector CT imaging of the abdomen and pelvis was performed
using the standard protocol following bolus administration of
intravenous contrast.

[Series 2: axial st · axial · 0.97mm/px · z∈[+1114,+1564]mm · 13 of 100 slices shown, 15 images]
[im 5/100  soft-tissue]
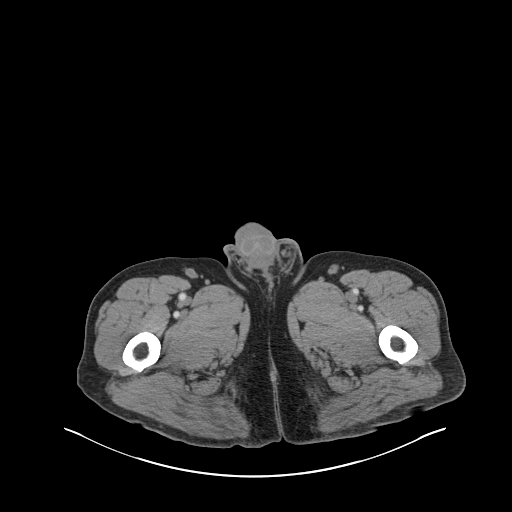
[im 5/100  bone]
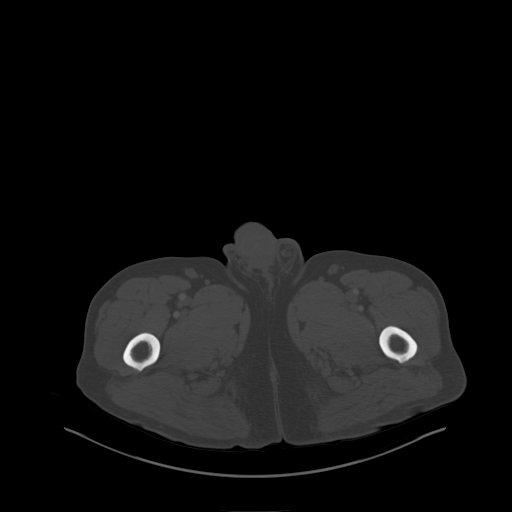
[im 14/100  soft-tissue]
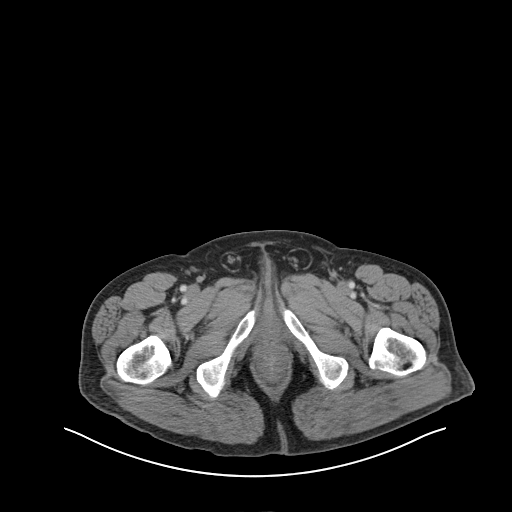
[im 23/100  soft-tissue]
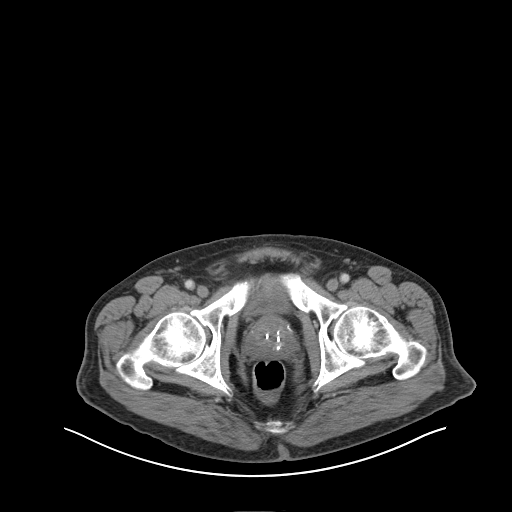
[im 28/100  soft-tissue]
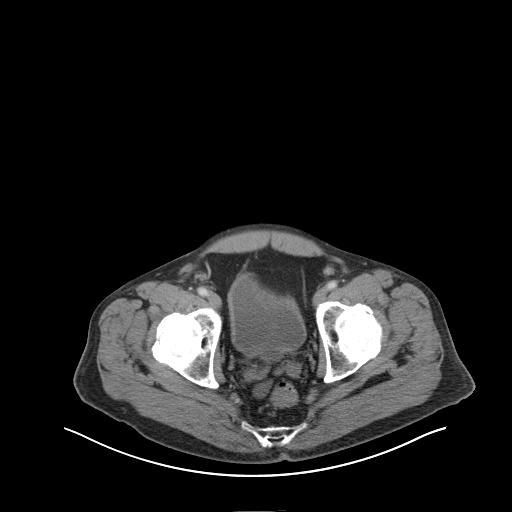
[im 37/100  soft-tissue]
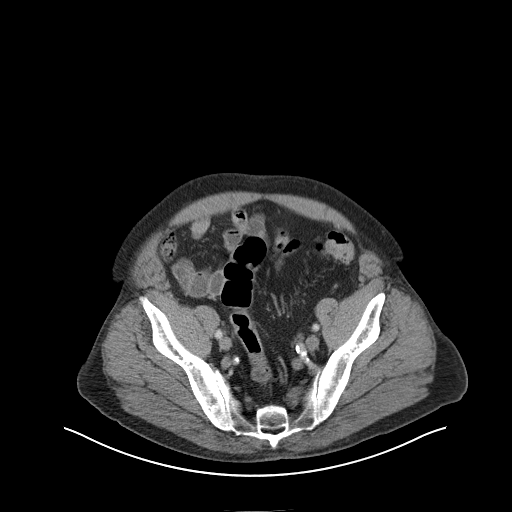
[im 41/100  soft-tissue]
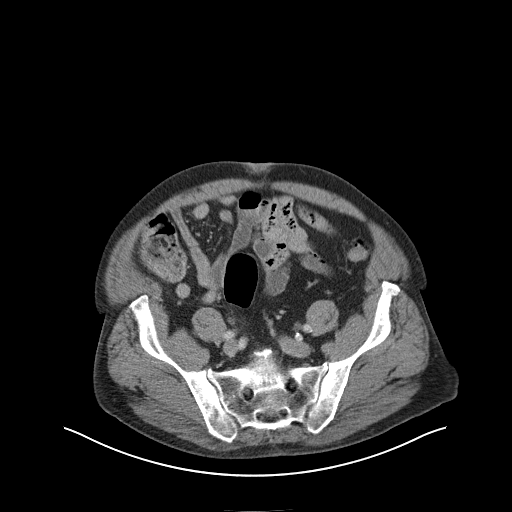
[im 50/100  soft-tissue]
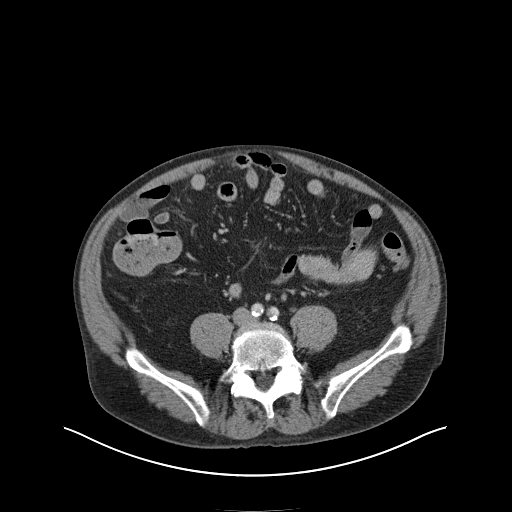
[im 59/100  soft-tissue]
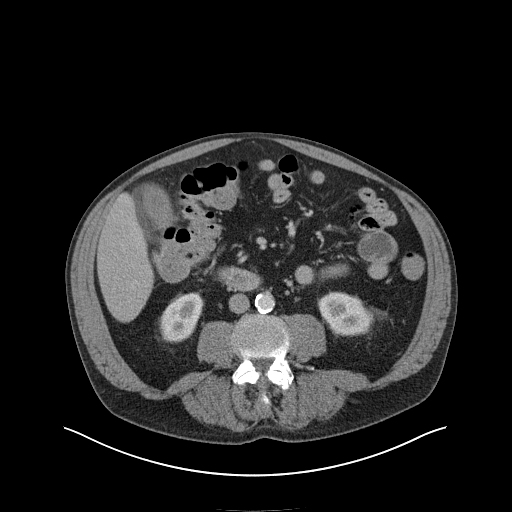
[im 64/100  soft-tissue]
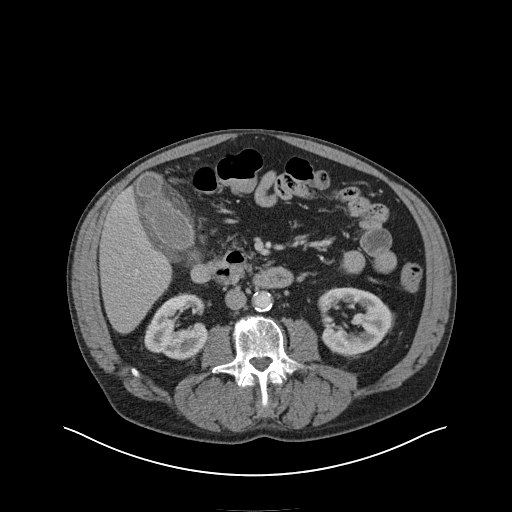
[im 64/100  bone]
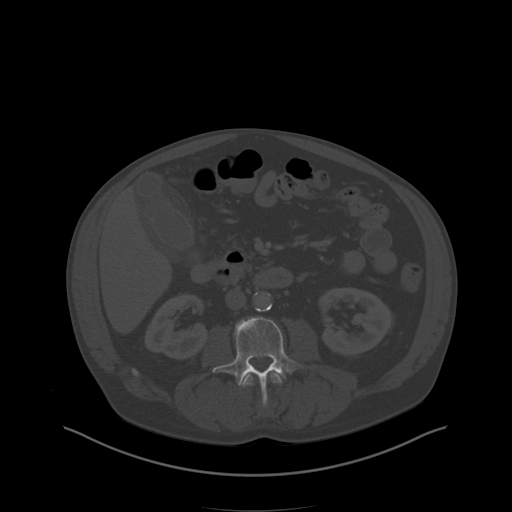
[im 73/100  soft-tissue]
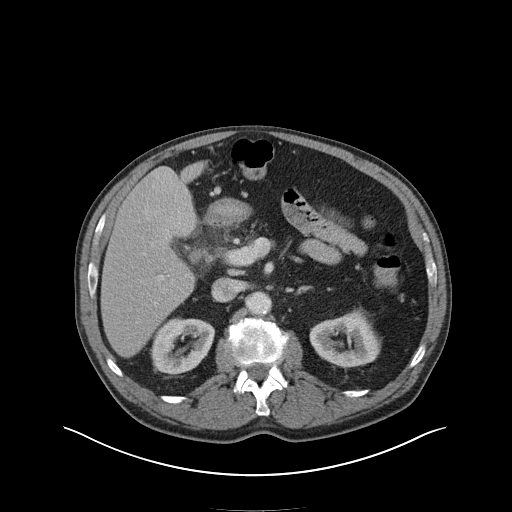
[im 77/100  soft-tissue]
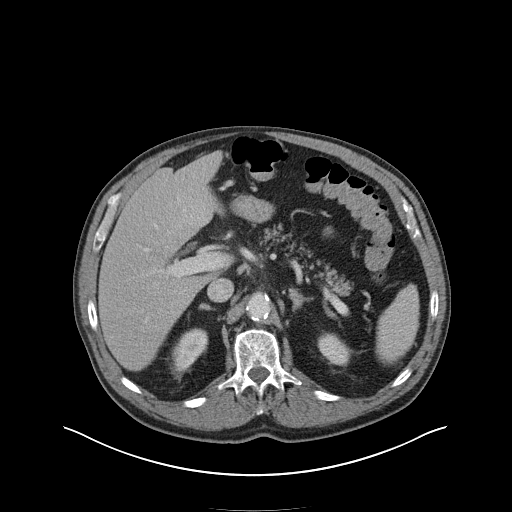
[im 86/100  soft-tissue]
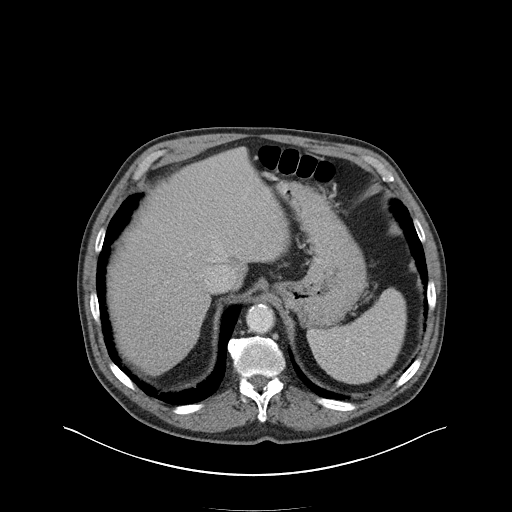
[im 95/100  soft-tissue]
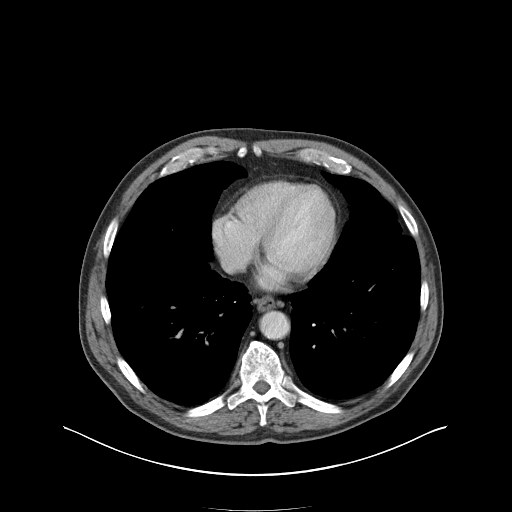

[Series 4: coronal st · coronal · 0.77mm/px · 3 of 166 slices shown]
[im 56/166  soft-tissue]
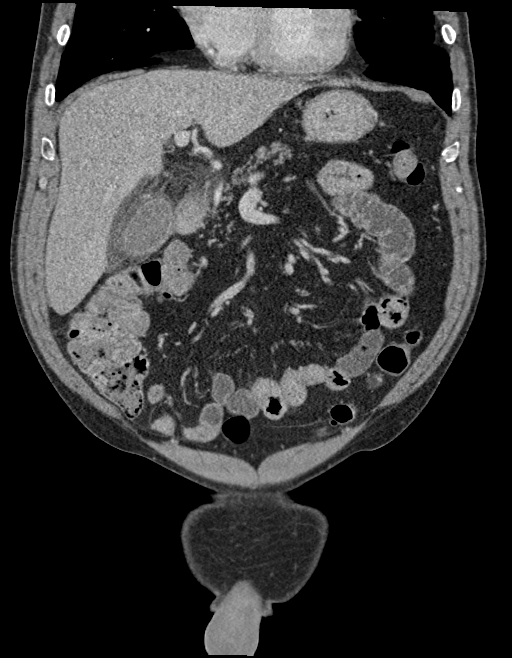
[im 74/166  soft-tissue]
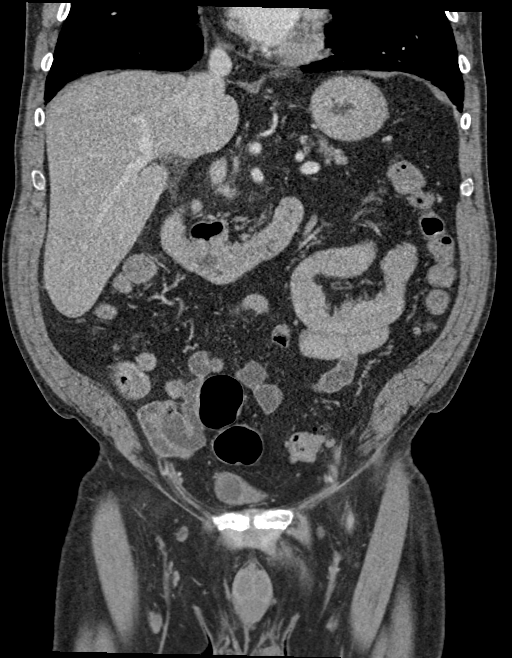
[im 92/166  soft-tissue]
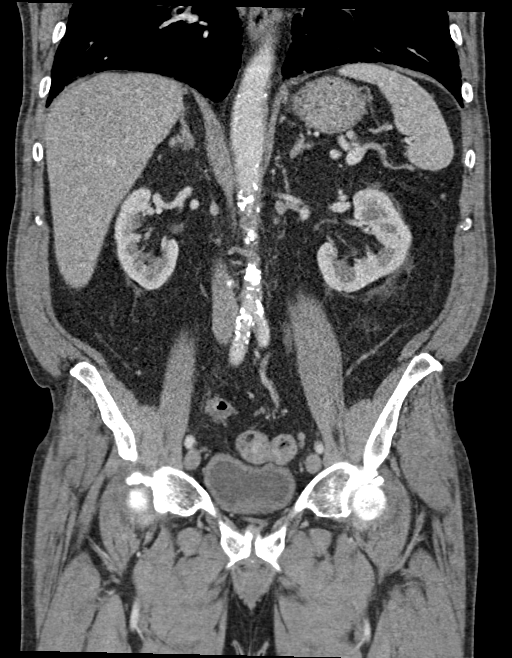

[16 of 46 positions shown; findings below may reference images not displayed]

RADIATION DOSE REDUCTION: This exam was performed according to the
departmental dose-optimization program which includes automated
exposure control, adjustment of the mA and/or kV according to
patient size and/or use of iterative reconstruction technique.

CONTRAST:  100mL OMNIPAQUE IOHEXOL 300 MG/ML  SOLN
FINDINGS: Lower chest: No acute abnormality. Fibrotic changes at the lung
bases.

Hepatobiliary: Liver is unremarkable. Gallbladder is distended with
significant surrounding infiltration. No stone identified.

Pancreas: Unremarkable.

Spleen: Unremarkable.

Adrenals/Urinary Tract: Adrenals, kidneys, and partially distended
bladder are unremarkable.

Stomach/Bowel: Stomach is within normal limits. Bowel is normal in
caliber. Distal colonic diverticulosis. Normal appendix.

Vascular/Lymphatic: Atherosclerosis.  No enlarged nodes.

Reproductive: Stable appearance of the prostate.

Other: Trace free fluid in the dependent pelvis. Umbilical hernia
containing a small portion of small bowel together with fat.

Musculoskeletal: No acute osseous abnormality.
IMPRESSION: Acute cholecystitis.  No stone identified by CT.

Umbilical hernia contains fat and a small portion of small bowel.

These results were called by telephone at the time of interpretation
on [DATE] at [DATE] to provider [HOSPITAL] DOLLIE , who verbally
acknowledged these results.

## 2021-08-31 IMAGING — DX DG CHEST 1V PORT
2 series · 2 of 2 positions shown · non-contrast
Comparison: Chest x-ray [DATE].

CLINICAL DATA: 71-year-old male with history of pain in the upper
abdomen and some nausea. Evaluate for potential free air.

EXAM:
PORTABLE CHEST 1 VIEW

[chest ap (1 of 2)]
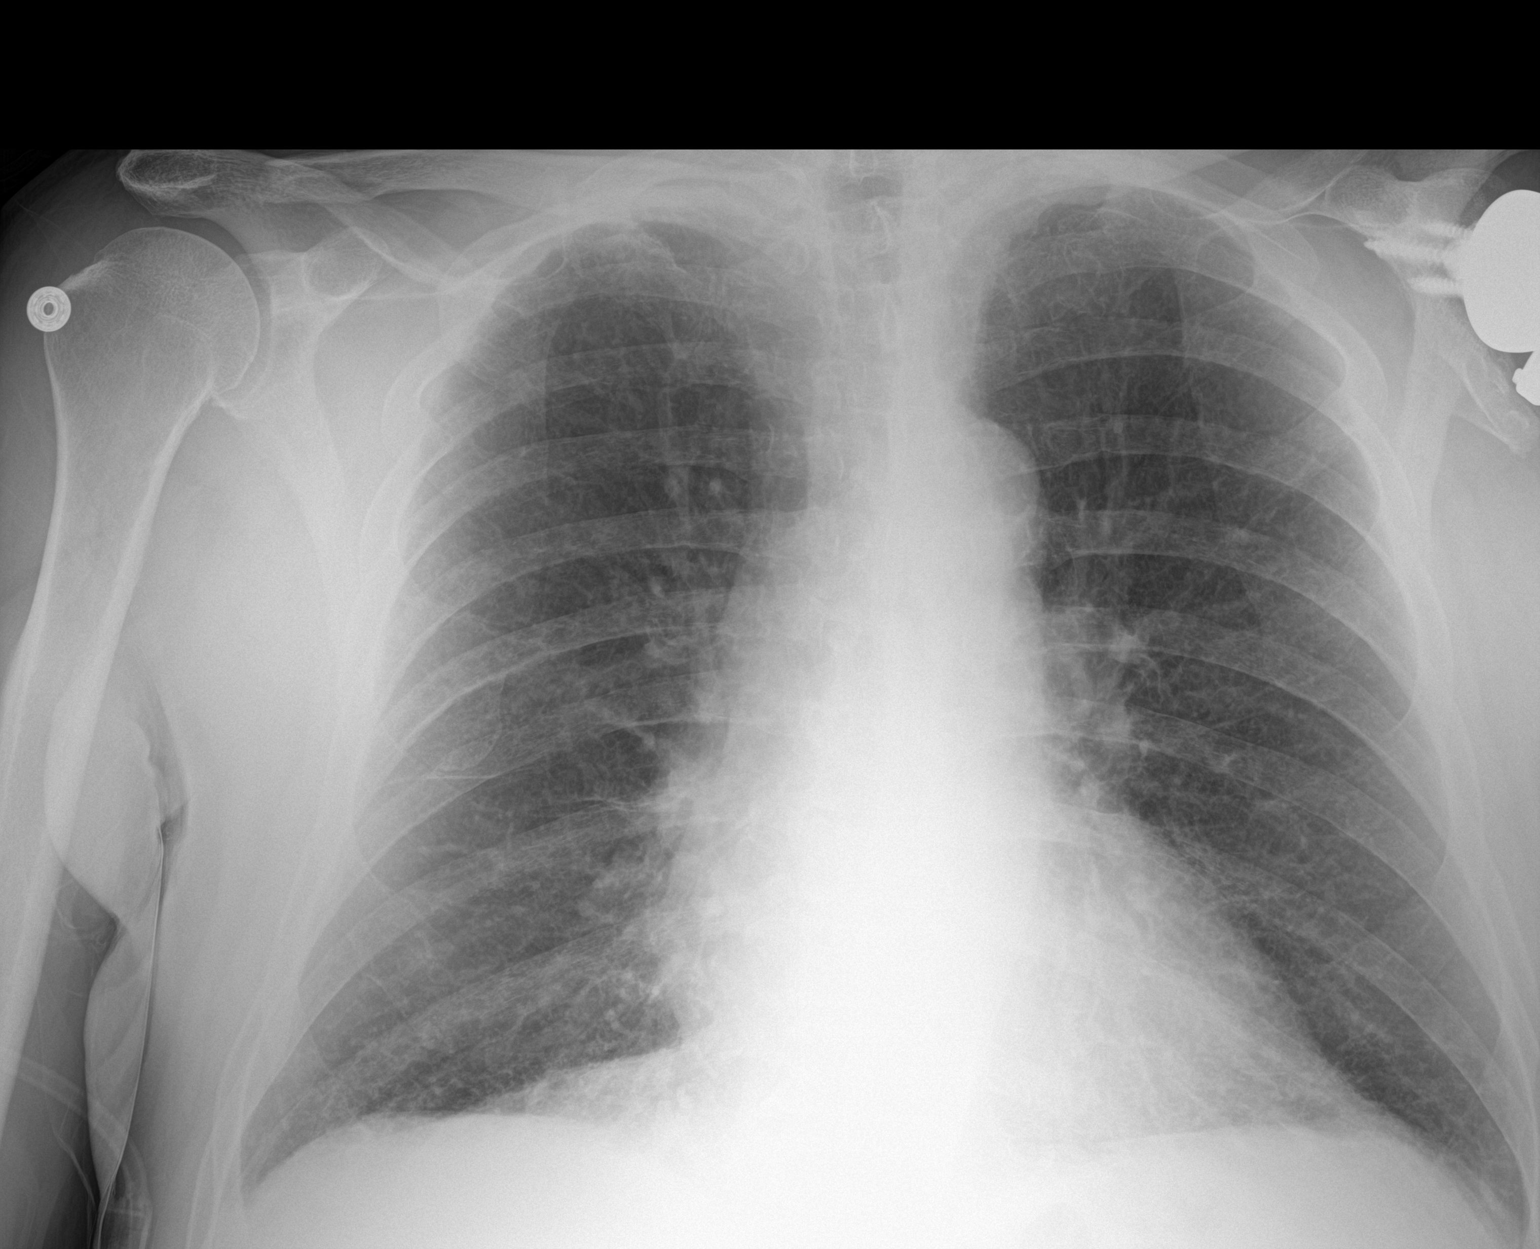

[chest ap (2 of 2)]
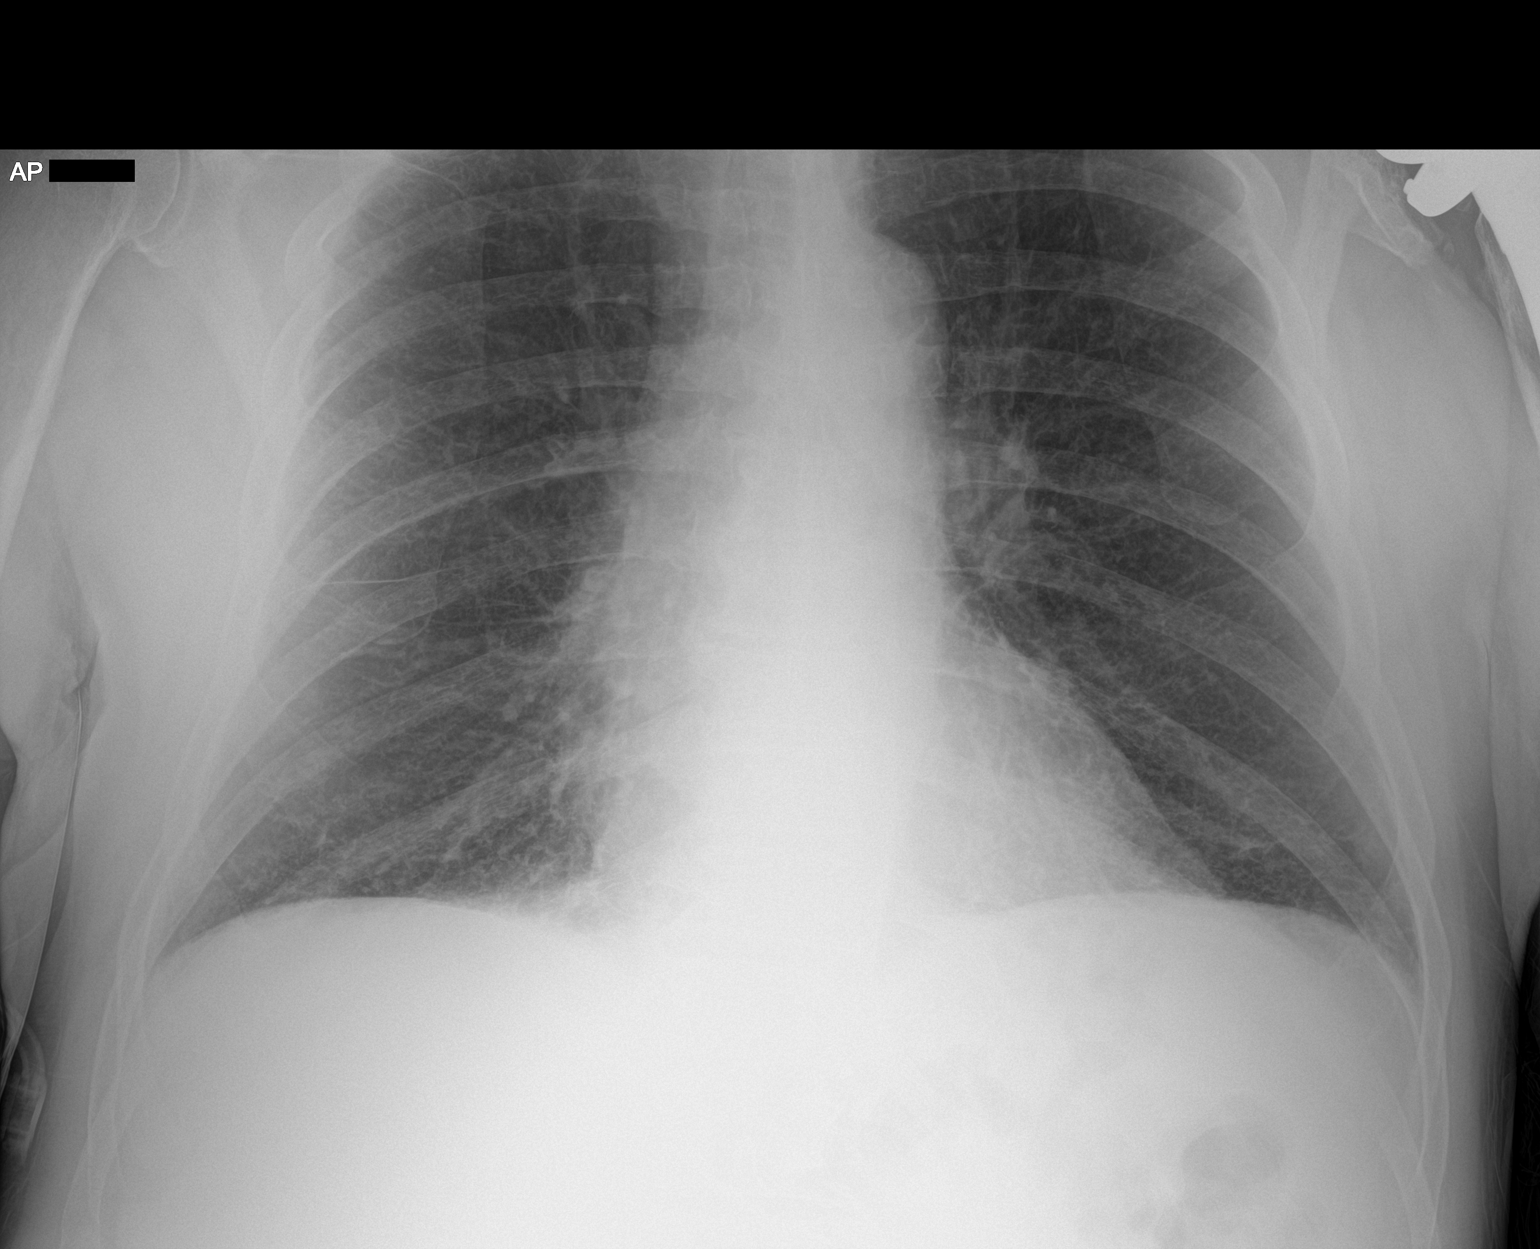

[2 of 2 positions shown; findings below may reference images not displayed]

FINDINGS: Lung volumes are normal. Diffuse interstitial prominence and
peribronchial cuffing, similar to the prior study. No consolidative
airspace disease. No pleural effusions. No pneumothorax. No
pulmonary nodule or mass noted. Pulmonary vasculature and the
cardiomediastinal silhouette are within normal limits.
Atherosclerosis in the thoracic aorta. Status post left shoulder
arthroplasty. No definite pneumoperitoneum.
IMPRESSION: 1. No definite pneumoperitoneum.
2. Diffuse interstitial prominence and peribronchial cuffing,
similar to prior examinations, suggesting chronic bronchitis.
3. Aortic atherosclerosis.

## 2021-08-31 SURGERY — LAPAROSCOPIC CHOLECYSTECTOMY WITH INTRAOPERATIVE CHOLANGIOGRAM
Anesthesia: General

## 2021-08-31 MED ORDER — DOCUSATE SODIUM 100 MG PO CAPS
100.0000 mg | ORAL_CAPSULE | Freq: Two times a day (BID) | ORAL | Status: DC
  Administered 2021-09-01: 100 mg via ORAL
  Filled 2021-08-31 (×2): qty 1

## 2021-08-31 MED ORDER — TAMSULOSIN HCL 0.4 MG PO CAPS
0.4000 mg | ORAL_CAPSULE | Freq: Every day | ORAL | Status: DC
  Filled 2021-08-31: qty 1

## 2021-08-31 MED ORDER — HEMOSTATIC AGENTS (NO CHARGE) OPTIME
TOPICAL | Status: DC | PRN
  Administered 2021-08-31: 1 via TOPICAL

## 2021-08-31 MED ORDER — FENTANYL CITRATE (PF) 100 MCG/2ML IJ SOLN
INTRAMUSCULAR | Status: DC | PRN
  Administered 2021-08-31: 100 ug via INTRAVENOUS
  Administered 2021-08-31 (×2): 50 ug via INTRAVENOUS

## 2021-08-31 MED ORDER — DIPHENHYDRAMINE HCL 50 MG/ML IJ SOLN: 12.5000 mg | Freq: Four times a day (QID) | INTRAMUSCULAR | Status: DC | PRN

## 2021-08-31 MED ORDER — LACTATED RINGERS IV BOLUS
1000.0000 mL | Freq: Once | INTRAVENOUS | Status: AC
  Administered 2021-08-31: 1000 mL via INTRAVENOUS

## 2021-08-31 MED ORDER — BUDESON-GLYCOPYRROL-FORMOTEROL 160-9-4.8 MCG/ACT IN AERO
1.0000 | INHALATION_SPRAY | Freq: Every day | RESPIRATORY_TRACT | Status: DC
Start: 2021-08-31 — End: 2021-08-31

## 2021-08-31 MED ORDER — ALBUTEROL SULFATE HFA 108 (90 BASE) MCG/ACT IN AERS
INHALATION_SPRAY | RESPIRATORY_TRACT | Status: DC | PRN
  Administered 2021-08-31 (×2): 2 via RESPIRATORY_TRACT

## 2021-08-31 MED ORDER — HYDROMORPHONE HCL 1 MG/ML IJ SOLN
0.2500 mg | INTRAMUSCULAR | Status: DC | PRN
  Administered 2021-08-31 (×4): 0.5 mg via INTRAVENOUS

## 2021-08-31 MED ORDER — LACTATED RINGERS IV SOLN: INTRAVENOUS | Status: DC

## 2021-08-31 MED ORDER — ONDANSETRON HCL 4 MG/2ML IJ SOLN
4.0000 mg | Freq: Once | INTRAMUSCULAR | Status: AC
  Administered 2021-08-31: 4 mg via INTRAVENOUS
  Filled 2021-08-31: qty 2

## 2021-08-31 MED ORDER — ORAL CARE MOUTH RINSE: 15.0000 mL | Freq: Once | OROMUCOSAL | Status: AC

## 2021-08-31 MED ORDER — BUPIVACAINE-EPINEPHRINE 0.25% -1:200000 IJ SOLN
INTRAMUSCULAR | Status: DC | PRN
  Administered 2021-08-31: 14 mL

## 2021-08-31 MED ORDER — INSULIN ASPART 100 UNIT/ML IJ SOLN
0.0000 [IU] | Freq: Three times a day (TID) | INTRAMUSCULAR | Status: DC
  Administered 2021-08-31: 3 [IU] via SUBCUTANEOUS

## 2021-08-31 MED ORDER — PIPERACILLIN-TAZOBACTAM 3.375 G IVPB 30 MIN
3.3750 g | Freq: Once | INTRAVENOUS | Status: AC
  Administered 2021-08-31: 3.375 g via INTRAVENOUS
  Filled 2021-08-31: qty 50

## 2021-08-31 MED ORDER — SUCCINYLCHOLINE CHLORIDE 200 MG/10ML IV SOSY
PREFILLED_SYRINGE | INTRAVENOUS | Status: AC
  Filled 2021-08-31: qty 20

## 2021-08-31 MED ORDER — LIDOCAINE 2% (20 MG/ML) 5 ML SYRINGE
INTRAMUSCULAR | Status: DC | PRN
  Administered 2021-08-31: 60 mg via INTRAVENOUS

## 2021-08-31 MED ORDER — ROCURONIUM BROMIDE 10 MG/ML (PF) SYRINGE
PREFILLED_SYRINGE | INTRAVENOUS | Status: AC
  Filled 2021-08-31: qty 20

## 2021-08-31 MED ORDER — LORATADINE 10 MG PO TABS
10.0000 mg | ORAL_TABLET | Freq: Every day | ORAL | Status: DC
  Filled 2021-08-31: qty 1

## 2021-08-31 MED ORDER — PANTOPRAZOLE SODIUM 40 MG PO TBEC
40.0000 mg | DELAYED_RELEASE_TABLET | Freq: Every day | ORAL | Status: DC
  Administered 2021-09-01: 40 mg via ORAL
  Filled 2021-08-31: qty 1

## 2021-08-31 MED ORDER — ALBUTEROL SULFATE (2.5 MG/3ML) 0.083% IN NEBU
2.5000 mg | INHALATION_SOLUTION | Freq: Once | RESPIRATORY_TRACT | Status: AC
  Administered 2021-08-31: 2.5 mg via RESPIRATORY_TRACT

## 2021-08-31 MED ORDER — ASCORBIC ACID 500 MG PO TABS
500.0000 mg | ORAL_TABLET | Freq: Every day | ORAL | Status: DC
  Filled 2021-08-31: qty 1

## 2021-08-31 MED ORDER — FENTANYL CITRATE (PF) 100 MCG/2ML IJ SOLN
INTRAMUSCULAR | Status: AC
  Filled 2021-08-31: qty 2

## 2021-08-31 MED ORDER — EPHEDRINE SULFATE-NACL 50-0.9 MG/10ML-% IV SOSY
PREFILLED_SYRINGE | INTRAVENOUS | Status: DC | PRN
  Administered 2021-08-31: 5 mg via INTRAVENOUS

## 2021-08-31 MED ORDER — LIDOCAINE HCL (PF) 2 % IJ SOLN
INTRAMUSCULAR | Status: AC
  Filled 2021-08-31: qty 40

## 2021-08-31 MED ORDER — OXYCODONE HCL 5 MG PO TABS: 5.0000 mg | ORAL_TABLET | Freq: Once | ORAL | Status: DC | PRN

## 2021-08-31 MED ORDER — SIMETHICONE 80 MG PO CHEW
40.0000 mg | CHEWABLE_TABLET | Freq: Four times a day (QID) | ORAL | Status: DC | PRN
  Administered 2021-08-31: 40 mg via ORAL
  Filled 2021-08-31 (×2): qty 1

## 2021-08-31 MED ORDER — ADULT MULTIVITAMIN W/MINERALS CH: 1.0000 | ORAL_TABLET | Freq: Every day | ORAL | Status: DC

## 2021-08-31 MED ORDER — BUPROPION HCL ER (XL) 150 MG PO TB24
150.0000 mg | ORAL_TABLET | Freq: Every day | ORAL | Status: DC
  Administered 2021-09-01: 150 mg via ORAL
  Filled 2021-08-31: qty 1

## 2021-08-31 MED ORDER — FLUTICASONE PROPIONATE 50 MCG/ACT NA SUSP: 1.0000 | Freq: Every day | NASAL | Status: DC | PRN

## 2021-08-31 MED ORDER — INSULIN ASPART 100 UNIT/ML IJ SOLN: 0.0000 [IU] | Freq: Every day | INTRAMUSCULAR | Status: DC

## 2021-08-31 MED ORDER — METHOCARBAMOL 500 MG PO TABS: 500.0000 mg | ORAL_TABLET | Freq: Four times a day (QID) | ORAL | Status: DC | PRN

## 2021-08-31 MED ORDER — DEXAMETHASONE SODIUM PHOSPHATE 10 MG/ML IJ SOLN
INTRAMUSCULAR | Status: AC
  Filled 2021-08-31: qty 1

## 2021-08-31 MED ORDER — EPHEDRINE 5 MG/ML INJ
INTRAVENOUS | Status: AC
  Filled 2021-08-31: qty 10

## 2021-08-31 MED ORDER — SUCCINYLCHOLINE CHLORIDE 200 MG/10ML IV SOSY
PREFILLED_SYRINGE | INTRAVENOUS | Status: DC | PRN
  Administered 2021-08-31: 100 mg via INTRAVENOUS

## 2021-08-31 MED ORDER — SUCCINYLCHOLINE CHLORIDE 200 MG/10ML IV SOSY
PREFILLED_SYRINGE | INTRAVENOUS | Status: AC
  Filled 2021-08-31: qty 10

## 2021-08-31 MED ORDER — PHENYLEPHRINE 80 MCG/ML (10ML) SYRINGE FOR IV PUSH (FOR BLOOD PRESSURE SUPPORT)
PREFILLED_SYRINGE | INTRAVENOUS | Status: AC
  Filled 2021-08-31: qty 60

## 2021-08-31 MED ORDER — HYDROMORPHONE HCL 1 MG/ML IJ SOLN: 1.0000 mg | Freq: Once | INTRAMUSCULAR | Status: DC

## 2021-08-31 MED ORDER — PROPOFOL 10 MG/ML IV BOLUS
INTRAVENOUS | Status: AC
  Filled 2021-08-31: qty 20

## 2021-08-31 MED ORDER — HYDROMORPHONE HCL 1 MG/ML IJ SOLN
INTRAMUSCULAR | Status: AC
  Filled 2021-08-31: qty 2

## 2021-08-31 MED ORDER — PROPOFOL 10 MG/ML IV BOLUS
INTRAVENOUS | Status: DC | PRN
  Administered 2021-08-31: 150 mg via INTRAVENOUS

## 2021-08-31 MED ORDER — ACETAMINOPHEN 10 MG/ML IV SOLN
1000.0000 mg | Freq: Once | INTRAVENOUS | Status: DC | PRN
Start: 2021-08-31 — End: 2021-08-31

## 2021-08-31 MED ORDER — SODIUM CHLORIDE 0.9 % IV SOLN
INTRAVENOUS | Status: DC | PRN
  Administered 2021-08-31: 10 mL

## 2021-08-31 MED ORDER — MOMETASONE FURO-FORMOTEROL FUM 100-5 MCG/ACT IN AERO
2.0000 | INHALATION_SPRAY | Freq: Two times a day (BID) | RESPIRATORY_TRACT | Status: DC
  Filled 2021-08-31: qty 8.8

## 2021-08-31 MED ORDER — ALBUTEROL SULFATE (2.5 MG/3ML) 0.083% IN NEBU
2.5000 mg | INHALATION_SOLUTION | Freq: Four times a day (QID) | RESPIRATORY_TRACT | Status: DC | PRN
Start: 2021-08-31 — End: 2021-09-01

## 2021-08-31 MED ORDER — OXYCODONE HCL 5 MG/5ML PO SOLN: 5.0000 mg | Freq: Once | ORAL | Status: DC | PRN

## 2021-08-31 MED ORDER — FENTANYL CITRATE PF 50 MCG/ML IJ SOSY
PREFILLED_SYRINGE | INTRAMUSCULAR | Status: AC
  Filled 2021-08-31: qty 2

## 2021-08-31 MED ORDER — NICOTINE 21 MG/24HR TD PT24
21.0000 mg | MEDICATED_PATCH | Freq: Every day | TRANSDERMAL | Status: DC
  Filled 2021-08-31 (×2): qty 1

## 2021-08-31 MED ORDER — ACETAMINOPHEN 500 MG PO TABS
1000.0000 mg | ORAL_TABLET | Freq: Four times a day (QID) | ORAL | Status: DC
  Administered 2021-08-31 – 2021-09-01 (×3): 1000 mg via ORAL
  Filled 2021-08-31 (×3): qty 2

## 2021-08-31 MED ORDER — ONDANSETRON 4 MG PO TBDP: 4.0000 mg | ORAL_TABLET | Freq: Four times a day (QID) | ORAL | Status: DC | PRN

## 2021-08-31 MED ORDER — ONDANSETRON HCL 4 MG/2ML IJ SOLN
INTRAMUSCULAR | Status: AC
  Filled 2021-08-31: qty 2

## 2021-08-31 MED ORDER — ARTIFICIAL TEARS OPHTHALMIC OINT
TOPICAL_OINTMENT | OPHTHALMIC | Status: AC
Start: 2021-08-31 — End: ?
  Filled 2021-08-31: qty 3.5

## 2021-08-31 MED ORDER — LISINOPRIL 5 MG PO TABS
2.5000 mg | ORAL_TABLET | Freq: Every day | ORAL | Status: DC
  Filled 2021-08-31: qty 1

## 2021-08-31 MED ORDER — PIPERACILLIN-TAZOBACTAM 3.375 G IVPB
3.3750 g | Freq: Three times a day (TID) | INTRAVENOUS | Status: DC
  Administered 2021-08-31 – 2021-09-01 (×2): 3.375 g via INTRAVENOUS
  Filled 2021-08-31 (×3): qty 50

## 2021-08-31 MED ORDER — ETOMIDATE 2 MG/ML IV SOLN
INTRAVENOUS | Status: AC
  Filled 2021-08-31: qty 10

## 2021-08-31 MED ORDER — ENOXAPARIN SODIUM 40 MG/0.4ML IJ SOSY
40.0000 mg | PREFILLED_SYRINGE | INTRAMUSCULAR | Status: DC
  Administered 2021-09-01: 40 mg via SUBCUTANEOUS
  Filled 2021-08-31: qty 0.4

## 2021-08-31 MED ORDER — ROCURONIUM BROMIDE 10 MG/ML (PF) SYRINGE
PREFILLED_SYRINGE | INTRAVENOUS | Status: AC
  Filled 2021-08-31: qty 10

## 2021-08-31 MED ORDER — INSULIN ASPART 100 UNIT/ML IJ SOLN: 0.0000 [IU] | Freq: Three times a day (TID) | INTRAMUSCULAR | Status: DC

## 2021-08-31 MED ORDER — ONDANSETRON HCL 4 MG/2ML IJ SOLN
INTRAMUSCULAR | Status: DC | PRN
  Administered 2021-08-31: 4 mg via INTRAVENOUS

## 2021-08-31 MED ORDER — ONDANSETRON HCL 4 MG/2ML IJ SOLN: 4.0000 mg | Freq: Four times a day (QID) | INTRAMUSCULAR | Status: DC | PRN

## 2021-08-31 MED ORDER — DIPHENHYDRAMINE HCL 12.5 MG/5ML PO ELIX: 12.5000 mg | ORAL_SOLUTION | Freq: Four times a day (QID) | ORAL | Status: DC | PRN

## 2021-08-31 MED ORDER — LACTATED RINGERS IR SOLN
Status: DC | PRN
  Administered 2021-08-31: 1000 mL

## 2021-08-31 MED ORDER — CHLORHEXIDINE GLUCONATE 0.12 % MT SOLN
15.0000 mL | Freq: Once | OROMUCOSAL | Status: AC
  Administered 2021-08-31: 15 mL via OROMUCOSAL
  Filled 2021-08-31: qty 15

## 2021-08-31 MED ORDER — IOHEXOL 300 MG/ML  SOLN
100.0000 mL | Freq: Once | INTRAMUSCULAR | Status: AC | PRN
  Administered 2021-08-31: 100 mL via INTRAVENOUS

## 2021-08-31 MED ORDER — BUPIVACAINE-EPINEPHRINE (PF) 0.25% -1:200000 IJ SOLN
INTRAMUSCULAR | Status: AC
  Filled 2021-08-31: qty 30

## 2021-08-31 MED ORDER — HYDROMORPHONE HCL 1 MG/ML IJ SOLN
1.0000 mg | INTRAMUSCULAR | Status: DC | PRN
  Administered 2021-08-31: 1 mg via INTRAVENOUS
  Filled 2021-08-31: qty 1

## 2021-08-31 MED ORDER — METOPROLOL TARTRATE 5 MG/5ML IV SOLN: 5.0000 mg | Freq: Four times a day (QID) | INTRAVENOUS | Status: DC | PRN

## 2021-08-31 MED ORDER — ONDANSETRON HCL 4 MG/2ML IJ SOLN: 4.0000 mg | Freq: Once | INTRAMUSCULAR | Status: DC | PRN

## 2021-08-31 MED ORDER — ROCURONIUM BROMIDE 10 MG/ML (PF) SYRINGE
PREFILLED_SYRINGE | INTRAVENOUS | Status: DC | PRN
  Administered 2021-08-31: 40 mg via INTRAVENOUS

## 2021-08-31 MED ORDER — SODIUM CHLORIDE 0.9 % IV SOLN: INTRAVENOUS | Status: DC

## 2021-08-31 MED ORDER — ONDANSETRON HCL 4 MG/2ML IJ SOLN
INTRAMUSCULAR | Status: AC
Start: 2021-08-31 — End: ?
  Filled 2021-08-31: qty 6

## 2021-08-31 MED ORDER — ALBUTEROL SULFATE (2.5 MG/3ML) 0.083% IN NEBU
INHALATION_SOLUTION | RESPIRATORY_TRACT | Status: AC
  Filled 2021-08-31: qty 3

## 2021-08-31 MED ORDER — UMECLIDINIUM BROMIDE 62.5 MCG/ACT IN AEPB
1.0000 | INHALATION_SPRAY | Freq: Every day | RESPIRATORY_TRACT | Status: DC
Start: 2021-09-01 — End: 2021-09-01
  Filled 2021-08-31: qty 7

## 2021-08-31 MED ORDER — ALBUTEROL SULFATE (2.5 MG/3ML) 0.083% IN NEBU: 2.5000 mg | INHALATION_SOLUTION | Freq: Four times a day (QID) | RESPIRATORY_TRACT | Status: DC | PRN

## 2021-08-31 MED ORDER — AMISULPRIDE (ANTIEMETIC) 5 MG/2ML IV SOLN: 10.0000 mg | Freq: Once | INTRAVENOUS | Status: DC | PRN

## 2021-08-31 MED ORDER — FUROSEMIDE 20 MG PO TABS: 20.0000 mg | ORAL_TABLET | Freq: Two times a day (BID) | ORAL | Status: DC | PRN

## 2021-08-31 MED ORDER — POLYETHYLENE GLYCOL 3350 17 G PO PACK: 17.0000 g | PACK | Freq: Every day | ORAL | Status: DC | PRN

## 2021-08-31 MED ORDER — SUGAMMADEX SODIUM 200 MG/2ML IV SOLN
INTRAVENOUS | Status: DC | PRN
  Administered 2021-08-31: 200 mg via INTRAVENOUS

## 2021-08-31 MED ORDER — PHENYLEPHRINE HCL-NACL 20-0.9 MG/250ML-% IV SOLN
INTRAVENOUS | Status: DC | PRN
  Administered 2021-08-31: 80 ug via INTRAVENOUS
  Administered 2021-08-31: 50 ug/min via INTRAVENOUS

## 2021-08-31 MED ORDER — GLYCOPYRROLATE 0.2 MG/ML IJ SOLN
INTRAMUSCULAR | Status: AC
  Filled 2021-08-31: qty 1

## 2021-08-31 MED ORDER — DEXAMETHASONE SODIUM PHOSPHATE 10 MG/ML IJ SOLN
INTRAMUSCULAR | Status: AC
  Filled 2021-08-31: qty 3

## 2021-08-31 MED ORDER — MORPHINE SULFATE (PF) 4 MG/ML IV SOLN
4.0000 mg | Freq: Once | INTRAVENOUS | Status: AC
  Administered 2021-08-31: 4 mg via INTRAVENOUS
  Filled 2021-08-31: qty 1

## 2021-08-31 MED ORDER — DEXAMETHASONE SODIUM PHOSPHATE 10 MG/ML IJ SOLN
INTRAMUSCULAR | Status: DC | PRN
  Administered 2021-08-31: 4 mg via INTRAVENOUS

## 2021-08-31 MED ORDER — OXYCODONE HCL 5 MG PO TABS
5.0000 mg | ORAL_TABLET | ORAL | Status: DC | PRN
  Administered 2021-08-31 – 2021-09-01 (×2): 10 mg via ORAL
  Filled 2021-08-31 (×2): qty 2

## 2021-08-31 SURGICAL SUPPLY — 65 items
APL PRP STRL LF DISP 70% ISPRP (MISCELLANEOUS) ×1
APL SKNCLS STERI-STRIP NONHPOA (GAUZE/BANDAGES/DRESSINGS) ×1
APPLIER CLIP ROT 10 11.4 M/L (STAPLE) ×2
APR CLP MED LRG 11.4X10 (STAPLE) ×1
BAG COUNTER SPONGE SURGICOUNT (BAG) IMPLANT
BAG RETRIEVAL 10 (BASKET)
BAG SPEC RTRVL 10 TROC 200 (ENDOMECHANICALS) ×1
BAG SPNG CNTER NS LX DISP (BAG)
BENZOIN TINCTURE PRP APPL 2/3 (GAUZE/BANDAGES/DRESSINGS) ×2 IMPLANT
BLADE HEX COATED 2.75 (ELECTRODE) ×2 IMPLANT
BLADE SURG 15 STRL LF DISP TIS (BLADE) ×1 IMPLANT
BLADE SURG 15 STRL SS (BLADE) ×2
CABLE HIGH FREQUENCY MONO STRZ (ELECTRODE) ×2 IMPLANT
CHLORAPREP W/TINT 26 (MISCELLANEOUS) ×2 IMPLANT
CLIP APPLIE ROT 10 11.4 M/L (STAPLE) ×1 IMPLANT
COVER MAYO STAND XLG (MISCELLANEOUS) ×2 IMPLANT
COVER SURGICAL LIGHT HANDLE (MISCELLANEOUS) ×2 IMPLANT
DRAPE C-ARM 42X120 X-RAY (DRAPES) ×2 IMPLANT
DRAPE LAPAROTOMY T 102X78X121 (DRAPES) ×2 IMPLANT
DRSG TEGADERM 2-3/8X2-3/4 SM (GAUZE/BANDAGES/DRESSINGS) ×6 IMPLANT
DRSG TEGADERM 4X4.75 (GAUZE/BANDAGES/DRESSINGS) ×2 IMPLANT
ELECT REM PT RETURN 15FT ADLT (MISCELLANEOUS) ×2 IMPLANT
GAUZE SPONGE 2X2 8PLY STRL LF (GAUZE/BANDAGES/DRESSINGS) IMPLANT
GAUZE SPONGE 4X4 12PLY STRL (GAUZE/BANDAGES/DRESSINGS) IMPLANT
GLOVE BIO SURGEON STRL SZ7 (GLOVE) ×2 IMPLANT
GLOVE BIOGEL PI IND STRL 7.5 (GLOVE) ×1 IMPLANT
GLOVE BIOGEL PI INDICATOR 7.5 (GLOVE) ×1
GOWN STRL REUS W/ TWL LRG LVL3 (GOWN DISPOSABLE) ×1 IMPLANT
GOWN STRL REUS W/TWL LRG LVL3 (GOWN DISPOSABLE) ×2
HEMOSTAT SNOW SURGICEL 2X4 (HEMOSTASIS) ×1 IMPLANT
IRRIG SUCT STRYKERFLOW 2 WTIP (MISCELLANEOUS) ×2
IRRIGATION SUCT STRKRFLW 2 WTP (MISCELLANEOUS) ×1 IMPLANT
KIT BASIN OR (CUSTOM PROCEDURE TRAY) ×2 IMPLANT
KIT TURNOVER KIT A (KITS) IMPLANT
NEEDLE HYPO 22GX1.5 SAFETY (NEEDLE) ×2 IMPLANT
NS IRRIG 1000ML POUR BTL (IV SOLUTION) ×2 IMPLANT
PACK BASIC VI WITH GOWN DISP (CUSTOM PROCEDURE TRAY) ×2 IMPLANT
PENCIL SMOKE EVACUATOR (MISCELLANEOUS) IMPLANT
POUCH RETRIEVAL ECOSAC 10 (ENDOMECHANICALS) IMPLANT
POUCH RETRIEVAL ECOSAC 10MM (ENDOMECHANICALS) ×2
PROTECTOR NERVE ULNAR (MISCELLANEOUS) IMPLANT
SCISSORS LAP 5X35 DISP (ENDOMECHANICALS) ×2 IMPLANT
SET CHOLANGIOGRAPH MIX (MISCELLANEOUS) ×2 IMPLANT
SET TUBE SMOKE EVAC HIGH FLOW (TUBING) ×2 IMPLANT
SPIKE FLUID TRANSFER (MISCELLANEOUS) ×2 IMPLANT
SPONGE GAUZE 2X2 STER 10/PKG (GAUZE/BANDAGES/DRESSINGS)
SPONGE T-LAP 4X18 ~~LOC~~+RFID (SPONGE) ×2 IMPLANT
STRIP CLOSURE SKIN 1/2X4 (GAUZE/BANDAGES/DRESSINGS) ×2 IMPLANT
SUT MNCRL AB 4-0 PS2 18 (SUTURE) ×2 IMPLANT
SUT NOVA NAB GS-21 0 18 T12 DT (SUTURE) IMPLANT
SUT PROLENE 0 CT 2 (SUTURE) IMPLANT
SUT VIC AB 3-0 SH 27 (SUTURE) ×2
SUT VIC AB 3-0 SH 27XBRD (SUTURE) ×1 IMPLANT
SYR 20ML LL LF (SYRINGE) IMPLANT
SYR CONTROL 10ML LL (SYRINGE) ×2 IMPLANT
SYS BAG RETRIEVAL 10MM (BASKET)
SYSTEM BAG RETRIEVAL 10MM (BASKET) IMPLANT
TAPE CLOTH 4X10 WHT NS (GAUZE/BANDAGES/DRESSINGS) IMPLANT
TOWEL OR 17X26 10 PK STRL BLUE (TOWEL DISPOSABLE) ×2 IMPLANT
TOWEL OR NON WOVEN STRL DISP B (DISPOSABLE) ×2 IMPLANT
TRAY LAPAROSCOPIC (CUSTOM PROCEDURE TRAY) ×2 IMPLANT
TROCAR 11X100 Z THREAD (TROCAR) ×2 IMPLANT
TROCAR BALLN 12MMX100 BLUNT (TROCAR) ×2 IMPLANT
TROCAR Z-THREAD OPTICAL 5X100M (TROCAR) ×4 IMPLANT
WATER STERILE IRR 1000ML POUR (IV SOLUTION) ×2 IMPLANT

## 2021-08-31 NOTE — ED Provider Notes (Signed)
[name]    CSN: [id number] Arrival date & time: 08/31/21  8676      History   Chief Complaint Chief Complaint  Patient presents with   Abdominal Pain    RT sided    HPI Frederick Guzman is a 71 y.o. male.   71yo male presents today with worsening abdominal pain. He states it started out Saturday evening, on the R side, primarily the RUQ. He states at that time it was a 6/10 but has progressed now to an 11/10. He reports his entire abdomen is distended and painful. Last evening he vomited due to the pain. Denies fever. No blood in stool.    Abdominal Pain   Past Medical History:  Diagnosis Date   Allergy    Arthritis    OA   Asthma    Cancer ([place]) 08/2014   melanoma on R shoulder & back - [facility]    COPD (chronic obstructive pulmonary disease) ([place])    Depression    pt. denies- 01/2016, pt on Wellbutrin for smoking cessation   Diabetes mellitus without complication ([place])    Diverticulitis    Dyspnea    with exertion   Fibromyalgia    GERD (gastroesophageal reflux disease)    Hyperlipemia    Hypertension    "never was high"   Lymphocytic colitis 04/2018   Pneumonia 06/2015, 10/2016   Sleep apnea    last study - Nov. 2017, waiting for a new machine, using the old CPAP q night after cleaning the device after he was documented to have pneumonia from his CPAP machine.     Patient Active Problem List   Diagnosis Date Noted   COPD with exacerbation ([place]) 04/13/2021   Hyperlipidemia 08/07/2020   Combined forms of age-related cataract of right eye 09/21/2018   Testosterone deficiency in male 04/10/2018   Pulmonary nodules 10/23/2017   Sepsis, unspecified organism ([place]) 10/23/2017   Pain in right hand 08/04/2017   Long term use of drug 04/19/2017   Pain in joint of left shoulder 04/19/2017   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   Positive depression screening 09/21/2016   Uncontrolled type 2 diabetes mellitus with hyperglycemia, without long-term  current use of insulin ([place]) 09/21/2016   Status post shoulder surgery 08/09/2016   Fibromyalgia 05/21/2016   S/P shoulder replacement, left 01/30/2016   Depression    HTN (hypertension)    BPH (benign prostatic hyperplasia) 06/17/2011   Hypogonadism male 06/17/2011   Arthritis of wrist 05/28/2011   MDD (major depressive disorder) ([place]) 04/30/2011   GERD (gastroesophageal reflux disease) 11/23/2010   DERMATITIS, ATOPIC 11/23/2010   COPD (chronic obstructive pulmonary disease) ([place]) 10/07/2010   TOBACCO ABUSE 03/10/2007   OBSTRUCTIVE SLEEP APNEA 03/10/2007   BRONCHITIS, CHRONIC 03/10/2007   ASTHMA, MODERATE 03/10/2007    Past Surgical History:  Procedure Laterality Date   carpel tunnel Bilateral    COLONOSCOPY W/ POLYPECTOMY     COLONOSCOPY W/ POLYPECTOMY     EXCISIONAL TOTAL SHOULDER ARTHROPLASTY WITH ANTIBIOTIC SPACER Left 08/09/2016   Procedure: LEFT SHOULDER HARDWARE REMOVAL, IRRIGATION AND DEBRIDEMENT, PLACEMENT OF ANTIBIOTIC SPACER;  Surgeon: Netta Cedars, MD;  Location: [facility];  Service: Orthopedics;  Laterality: Left;   IRRIGATION AND DEBRIDEMENT SHOULDER Left 03/31/2016   Procedure: IRRIGATION AND DEBRIDEMENT SHOULDER, deep cultures and polyethylene exchange;  Surgeon: Netta Cedars, MD;  Location: [facility];  Service: Orthopedics;  Laterality: Left;  requests 79mns   REVERSE SHOULDER ARTHROPLASTY Left 01/30/2016   Procedure: LEFT REVERSE SHOULDER ARTHROPLASTY;  Surgeon: Netta Cedars, MD;  Location: [facility];  Service: Orthopedics;  Laterality: Left;   ROTATOR CUFF REPAIR Left    SHOULDER ARTHROSCOPY WITH DISTAL CLAVICLE RESECTION Right 01/30/2018   Procedure: RIGHT SHOULDER ARTHROSCOPY WITH OPEN DISTAL CLAVICLE RESECTION, SUBACROMIAL DECOMPRESSION;  Surgeon: Netta Cedars, MD;  Location: [facility];  Service: Orthopedics;  Laterality: Right;   STERIOD INJECTION Right 01/30/2018   Procedure: STEROID INJECTION;  Surgeon: Netta Cedars, MD;  Location: [facility];  Service: Orthopedics;   Laterality: Right;   TOTAL SHOULDER REVISION Left 01/17/2017   Procedure: LEFT SHOULDER REVISION REVERSE TOTAL SHOULDER ARTHROPLASTY;  Surgeon: Netta Cedars, MD;  Location: [facility];  Service: Orthopedics;  Laterality: Left;   WRIST SURGERY Right 11/2014   Dr. Amedeo Plenty       Home Medications    Prior to Admission medications   Medication Sig Start Date End Date Taking? Authorizing Provider  albuterol (PROVENTIL) (2.5 MG/3ML) 0.083% nebulizer solution Take 2.5 mg by nebulization every 6 (six) hours as needed for wheezing or shortness of breath.    [provider]  albuterol (VENTOLIN HFA) 108 (90 Base) MCG/ACT inhaler Inhale 2 puffs into the lungs every 6 (six) hours as needed for wheezing or shortness of breath.    [provider]  atorvastatin (LIPITOR) 20 MG tablet Take 1 tablet (20 mg total) by mouth every other day. 02/17/21   Hali Marry, MD  b complex vitamins tablet Take 1 tablet by mouth daily.    [provider]  Budeson-Glycopyrrol-Formoterol (BREZTRI AEROSPHERE) 160-9-4.8 MCG/ACT AERO Inhale into the lungs. 11/14/19   [provider]  buPROPion (WELLBUTRIN XL) 150 MG 24 hr tablet Take 1 tablet (150 mg total) by mouth daily. 08/04/21   Luetta Nutting, DO  cetirizine (ZYRTEC) 10 MG tablet Take 10 mg by mouth daily as needed for allergies.     [provider]  esomeprazole (NEXIUM) 40 MG capsule Take 1 capsule (40 mg total) by mouth daily. NO REFILLS. NEEDS TO TRANSITION CARE TO NEW PCP. 06/15/21   Luetta Nutting, DO  fluticasone (FLONASE) 50 MCG/ACT nasal spray Place 1 spray into both nostrils daily as needed for allergies. 12/30/15   [provider]  furosemide (LASIX) 20 MG tablet Take 1 tablet (20 mg total) by mouth 2 (two) times daily as needed for edema. 07/24/21   Luetta Nutting, DO  glucose blood (PRODIGY NO CODING BLOOD GLUC) test strip Use up to 4 times per day as directed with glucometer. 09/17/20   [name],  DO  Insulin Pen Needle 32G X 4 MM MISC For use with Lantus Solostar Patient taking differently: For use with Lantus Solostar. 60 units of insulin 09/15/20   Trixie Dredge, PA-C  LANTUS SOLOSTAR 100 UNIT/ML Solostar Pen Inject into the skin. 08/03/21   [provider]  lisinopril (ZESTRIL) 2.5 MG tablet Take 1 tablet (2.5 mg total) by mouth at bedtime. 08/20/21   Breeback, Royetta Car, PA-C  Multiple Vitamin (MULTIVITAMIN WITH MINERALS) TABS tablet Take 1 tablet by mouth at bedtime.    [provider]  Omega-3 Fatty Acids (FISH OIL) 1000 MG CAPS Take 1,000 mg by mouth at bedtime.     [provider]  Polyvinyl Alcohol-Povidone (REFRESH OP) Place 1 drop into both eyes daily as needed (dry eyes).    [provider]  tacrolimus (PROTOPIC) 0.1 % ointment Apply 1 application topically daily. 02/05/19   [provider]  tadalafil (CIALIS) 5 MG tablet Take 5 mg by mouth  daily. 12/18/20   [provider]  tamsulosin (FLOMAX) 0.4 MG CAPS capsule Take 0.4 mg by mouth.    [provider]  Testosterone 30 MG/ACT SOLN APPLY 1-2 PUMPS DAILY. ALTERNATE TAKING ONE PUMP UNDER ONE ARM ONE DAY, TWO PUMPS THE NEXT DAY (ONE PUMP UNDER EACH ARM) 04/14/21   Luetta Nutting, DO  vitamin C (ASCORBIC ACID) 500 MG tablet Take 500 mg by mouth daily.    [provider]    Family History Family History  Problem Relation Age of Onset   Diabetes Mother    Colon cancer Father    Prostate cancer Father    Diabetes Father    Esophageal cancer Neg Hx    Rectal cancer Neg Hx    Stomach cancer Neg Hx     Social History Social History   Tobacco Use   Smoking status: Every Day    Packs/day: 0.75    Years: 43.00    Total pack years: 32.25    Types: Cigarettes   Smokeless tobacco: Never   Tobacco comments:    pt no longer taking chantix  Vaping Use   Vaping Use: Never used  Substance Use Topics   Alcohol use: Yes    Alcohol/week: 9.0 standard  drinks of alcohol    Types: 6 Cans of beer, 3 Shots of liquor per week    Comment: beer or mixed drink    Drug use: No     Allergies   Methotrexate derivatives and Tramadol   Review of Systems Review of Systems  Gastrointestinal:  Positive for abdominal pain.     Physical Exam Triage Vital Signs ED Triage Vitals  Enc Vitals Group     BP 08/31/21 0827 127/71     Pulse Rate 08/31/21 0827 93     Resp 08/31/21 0827 18     Temp 08/31/21 0827 98.4 F (36.9 C)     Temp Source 08/31/21 0827 Oral     SpO2 08/31/21 0827 94 %     Weight --      Height --      Head Circumference --      Peak Flow --      Pain Score 08/31/21 0826 9     Pain Loc --      Pain Edu? --      Excl. in [place]? --    No data found.  Updated Vital Signs BP 127/71 (BP Location: Left Arm)   Pulse 93   Temp 98.4 F (36.9 C) (Oral)   Resp 18   SpO2 94%   Visual Acuity Right Eye Distance:   Left Eye Distance:   Bilateral Distance:    Right Eye Near:   Left Eye Near:    Bilateral Near:     Physical Exam Vitals and nursing note reviewed. Exam conducted with a chaperone present.  Constitutional:      General: He is in acute distress (severe pain).     Appearance: He is well-developed and normal weight. He is not toxic-appearing or diaphoretic.  HENT:     Head: Normocephalic.  Abdominal:     General: There is distension.     Palpations: Abdomen is rigid.     Tenderness: There is generalized abdominal tenderness (peritonitis. Just placing stethoscope on abdomen causes severe tenderness).  Skin:    General: Skin is warm and dry.  Neurological:     General: No focal deficit present.     Mental Status: He is alert and  oriented to person, place, and time.  Psychiatric:        Mood and Affect: Mood normal.      UC Treatments / Results  Labs (all labs ordered are listed, but only abnormal results are displayed) Labs Reviewed - No data to display  EKG   Radiology No results  found.  Procedures Procedures (including critical care time)  Medications Ordered in UC Medications - No data to display  Initial Impression / Assessment and Plan / UC Course  I have reviewed the triage vital signs and the nursing notes.  Pertinent labs & imaging results that were available during my care of the patient were reviewed by me and considered in my medical decision making (see chart for details).     Peritonitis - discussed severity of condition and need for further workup in ER. EMS transport offered, pt refused. VSS. Pt to head straight to ER, pt chose [facility]. Discouraged freestanding ER with the understanding he will likely be admitted. No pain medication administered to allow ER discretion  Final Clinical Impressions(s) / UC Diagnoses   Final diagnoses:  Peritonitis [facility])     Discharge Instructions      Your severe abdominal pain is due to peritonitis, which is likely due to infection or organ rupture in your abdomen. THIS IS A VERY SERIOUS CONDITION and need IMMEDIATE EVALUATE IN THE ER.  Please head to [facility] ER. EMS transport recommended, however you are stable enough for personal transport. Please head there immediately.     ED Prescriptions   None    PDMP not reviewed this encounter.   Chaney Malling, [place] 08/31/21 [phone]

## 2021-08-31 NOTE — ED Notes (Signed)
Pt aware urine sample needed. Urinal at bedside.

## 2021-08-31 NOTE — ED Triage Notes (Signed)
Pt c/o RT sided abd pain since Sat evening. Pepto and Mylanta prn with no relief. Vomited late Saturday due to pain. Hx of diverticulitis in 2009.

## 2021-08-31 NOTE — ED Notes (Signed)
Patient is being discharged from the Urgent Care and sent to the Emergency Department via POV driven by wife. Per Geryl Councilman PA, patient is in need of higher level of care due to RT sided abd pain and r/o appendicitis. Patient is aware and verbalizes understanding of plan of care.  Vitals:   08/31/21 0827  BP: 127/71  Pulse: 93  Resp: 18  Temp: 98.4 F (36.9 C)  SpO2: 94%

## 2021-08-31 NOTE — Op Note (Signed)
Laparoscopic Cholecystectomy/ Umbilical hernia repair Procedure Note  Indications: This patient presents with acute cholecystitis and will undergo laparoscopic cholecystectomy.  He also has an umbilical hernia that will be primarily repaired.    Pre-operative Diagnosis: Calculus of gallbladder with acute cholecystitis, without mention of obstruction/ umbilical hernia  Post-operative Diagnosis: Same  Surgeon: Maia Petties   Assistants: Barkley Boards, PA-C  Anesthesia: General endotracheal anesthesia  ASA Class: 2  Procedure Details  The patient was seen again in the Holding Room. The risks, benefits, complications, treatment options, and expected outcomes were discussed with the patient. The possibilities of reaction to medication, pulmonary aspiration, perforation of viscus, bleeding, recurrent infection, finding a normal gallbladder, the need for additional procedures, failure to diagnose a condition, the possible need to convert to an open procedure, and creating a complication requiring transfusion or operation were discussed with the patient. The likelihood of improving the patient's symptoms with return to their baseline status is good.  The patient and/or family concurred with the proposed plan, giving informed consent. The site of surgery properly noted. The patient was taken to Operating Room, identified as Frederick Guzman and the procedure verified as Laparoscopic Cholecystectomy with Intraoperative Cholangiogram. A Time Out was held and the above information confirmed.  Prior to the induction of general anesthesia, antibiotic prophylaxis was administered. General endotracheal anesthesia was then administered and tolerated well. After the induction, the abdomen was prepped with Chloraprep and draped in sterile fashion. The patient was positioned in the supine position.  Local anesthetic agent was injected into the skin above the umbilicus and an incision made. We dissected down  to hernia sac with cautery.  I mobilized the entire hernia sac down to the abdominal fascia with blunt dissection and cautery.  The hernia sac was amputated, revealing a 1.5 cm fascial defect.  A pursestring suture of 0-Vicryl was placed around the fascial opening.  The Hasson cannula was inserted and secured with the stay suture.  Pneumoperitoneum was then created with CO2 and tolerated well without any adverse changes in the patient's vital signs. An 11-mm port was placed in the subxiphoid position.  Two 5-mm ports were placed in the right upper quadrant. All skin incisions were infiltrated with a local anesthetic agent before making the incision and placing the trocars.   We positioned the patient in reverse Trendelenburg, tilted slightly to the patient's left.  He has a large amount of omental adhesions to the anterior abdominal wall in the RUQ and to the edge of the liver.  We took down the adhesions with scissors.  The gallbladder was identified and was acutely inflamed.  We decompressed the gallbladder with the suction aspirator.  The fundus was grasped and retracted cephalad. Adhesions were lysed bluntly and with the electrocautery where indicated, taking care not to injure any adjacent organs or viscus. The infundibulum was grasped and retracted laterally, exposing the peritoneum overlying the triangle of Calot. This was then divided and exposed in a blunt fashion. The cystic duct was clearly identified and bluntly dissected circumferentially. A critical view of the cystic duct and cystic artery was obtained.  We attempted a cholangiogram, but we were unable to shoot a cholangiogram due to a leak, possibly from a stone in the distal cystic duct.  I was able to pass about 3 cm of the catheter into the cystic duct with no sign of obstruction, but there may be something at the cystic duct/ common duct junction.  We removed the catheter.  The  cystic duct was then ligated with clips and divided. The cystic  artery was, dissected free, ligated with clips and divided as well.   The gallbladder was dissected from the liver bed in retrograde fashion with the electrocautery. The gallbladder was removed and placed in an Eco sac. The liver bed was irrigated and inspected. Hemostasis was achieved with the electrocautery and Surgicel SNOW. Copious irrigation was utilized and was repeatedly aspirated until clear.  The gallbladder and Eco sac were then removed through the umbilical port site.  The pursestring suture was used to close the umbilical fascia defect.    We again inspected the right upper quadrant for hemostasis.  Pneumoperitoneum was released as we removed the trocars.  4-0 Monocryl was used to close the skin.   Benzoin, steri-strips, and clean dressings were applied. The patient was then extubated and brought to the recovery room in stable condition. Instrument, sponge, and needle counts were correct at closure and at the conclusion of the case.   Findings: Severe acute cholecystitis; omental adhesions in the upper abdomen; 1.5 cm fascial defect at umbilicus  Estimated Blood Loss: less than 50 mL         Drains: none         Specimens: Gallbladder           Complications: None; patient tolerated the procedure well.         Disposition: PACU - hemodynamically stable.  Recheck labs in AM         Condition: stable  Imogene Burn. Georgette Dover, MD, [facility] Surgery  General Surgery   08/31/2021 3:09 PM

## 2021-08-31 NOTE — Anesthesia Preprocedure Evaluation (Addendum)
Anesthesia Evaluation  Patient identified by MRN, date of birth, ID band Patient awake    Reviewed: Allergy & Precautions, NPO status , Patient's Chart, lab work & pertinent test results  Airway Mallampati: II  TM Distance: >3 FB     Dental   Pulmonary shortness of breath, asthma , sleep apnea , pneumonia, COPD, Current Smoker and Patient abstained from smoking.,    breath sounds clear to auscultation       Cardiovascular hypertension,  Rhythm:Regular Rate:Normal     Neuro/Psych PSYCHIATRIC DISORDERS  Neuromuscular disease    GI/Hepatic Neg liver ROS, GERD  ,  Endo/Other  diabetes  Renal/GU negative Renal ROS     Musculoskeletal  (+) Arthritis , Fibromyalgia -  Abdominal   Peds  Hematology   Anesthesia Other Findings   Reproductive/Obstetrics                             Anesthesia Physical Anesthesia Plan  ASA: 3  Anesthesia Plan: General   Post-op Pain Management:    Induction: Intravenous  PONV Risk Score and Plan: 2 and Ondansetron, Dexamethasone and Midazolam  Airway Management Planned: Oral ETT  Additional Equipment:   Intra-op Plan:   Post-operative Plan: Extubation in OR  Informed Consent: I have reviewed the patients History and Physical, chart, labs and discussed the procedure including the risks, benefits and alternatives for the proposed anesthesia with the patient or authorized representative who has indicated his/her understanding and acceptance.     Dental advisory given  Plan Discussed with: CRNA and Anesthesiologist  Anesthesia Plan Comments:         Anesthesia Quick Evaluation

## 2021-08-31 NOTE — Anesthesia Procedure Notes (Addendum)
Procedure Name: Intubation Date/Time: 08/31/2021 1:51 PM  Performed by: Cleda Daub, CRNAPre-anesthesia Checklist: Patient identified, Emergency Drugs available, Suction available and Patient being monitored Patient Re-evaluated:Patient Re-evaluated prior to induction Oxygen Delivery Method: Circle system utilized Preoxygenation: Pre-oxygenation with 100% oxygen Induction Type: IV induction Ventilation: Mask ventilation without difficulty Laryngoscope Size: Mac and 4 Grade View: Grade I Tube type: Oral Tube size: 7.5 mm Number of attempts: 1 Airway Equipment and Method: Stylet and Oral airway Placement Confirmation: ETT inserted through vocal cords under direct vision, positive ETCO2 and breath sounds checked- equal and bilateral Secured at: 23 cm Tube secured with: Tape Dental Injury: Teeth and Oropharynx as per pre-operative assessment

## 2021-08-31 NOTE — Transfer of Care (Signed)
Immediate Anesthesia Transfer of Care Note  Patient: [name]  Procedure(s) Performed: LAPAROSCOPIC CHOLECYSTECTOMY AND UMBILICAL HERNIA REPAIR HERNIA REPAIR UMBILICAL ADULT possible  Patient Location: PACU  Anesthesia Type:General  Level of Consciousness: awake, alert , oriented and patient cooperative  Airway & Oxygen Therapy: Patient Spontanous Breathing and Patient connected to face mask oxygen  Post-op Assessment: Report given to RN and Post -op Vital signs reviewed and stable  Post vital signs: Reviewed and stable  Last Vitals:  Vitals Value Taken Time  BP 102/88 08/31/21 1530  Temp 37.1 C 08/31/21 1530  Pulse 98 08/31/21 1532  Resp 23 08/31/21 1530  SpO2 97 % 08/31/21 1532  Vitals shown include unvalidated device data.  Last Pain:  Vitals:   08/31/21 1328  TempSrc:   PainSc: 10-Worst pain ever         Complications: No notable events documented.

## 2021-08-31 NOTE — Progress Notes (Signed)
Pharmacy Note   A consult was received from an ED physician for Zosyn  per pharmacy dosing.    The patient's profile has been reviewed for ht/wt/allergies/indication/available labs.    A one time order has been placed for Zosyn 3.375 gr IV x 1.   Further antibiotics/pharmacy consults should be ordered by admitting physician if indicated.                       Thank you,  Royetta Asal, PharmD, BCPS 08/31/2021 11:55 AM

## 2021-08-31 NOTE — H&P (Signed)
Admission Note  [facility] Mar 24, 1950  [id number].    Requesting MD: Teressa Lower, MD Chief Complaint/Reason for Consult: acute cholecystitis  HPI:  Patient is a 71 year old male who presented to the ED with RUQ abdominal pain since Saturday. Pain has progressively worsened since onset and he has had associated nausea and emesis. Denies fever, chills, diarrhea, urinary symptoms, chest pain, SOB. PMH significant for HTN, COPD, T2DM, HLD, fibromyalgia. No prior abdominal surgery. Not on any blood thinners. Allergies to methotrexate and tramadol. Denies daily alcohol use, smokes less than 1 PPD, denies illicit drug use. Wife present at bedside as well.   ROS: Negative other than HPI  Family History  Problem Relation Age of Onset   Diabetes Mother    Colon cancer Father    Prostate cancer Father    Diabetes Father    Esophageal cancer Neg Hx    Rectal cancer Neg Hx    Stomach cancer Neg Hx     Past Medical History:  Diagnosis Date   Allergy    Arthritis    OA   Asthma    Cancer ([place]) 08/2014   melanoma on R shoulder & back - [facility]    COPD (chronic obstructive pulmonary disease) ([place])    Depression    pt. denies- 01/2016, pt on Wellbutrin for smoking cessation   Diabetes mellitus without complication ([place])    Diverticulitis    Dyspnea    with exertion   Fibromyalgia    GERD (gastroesophageal reflux disease)    Hyperlipemia    Hypertension    "never was high"   Lymphocytic colitis 04/2018   Pneumonia 06/2015, 10/2016   Sleep apnea    last study - Nov. 2017, waiting for a new machine, using the old CPAP q night after cleaning the device after he was documented to have pneumonia from his CPAP machine.     Past Surgical History:  Procedure Laterality Date   carpel tunnel Bilateral    COLONOSCOPY W/ POLYPECTOMY     COLONOSCOPY W/ POLYPECTOMY     EXCISIONAL TOTAL SHOULDER ARTHROPLASTY WITH ANTIBIOTIC SPACER Left 08/09/2016   Procedure: LEFT SHOULDER  HARDWARE REMOVAL, IRRIGATION AND DEBRIDEMENT, PLACEMENT OF ANTIBIOTIC SPACER;  Surgeon: Netta Cedars, MD;  Location: [place];  Service: Orthopedics;  Laterality: Left;   IRRIGATION AND DEBRIDEMENT SHOULDER Left 03/31/2016   Procedure: IRRIGATION AND DEBRIDEMENT SHOULDER, deep cultures and polyethylene exchange;  Surgeon: Netta Cedars, MD;  Location: [place];  Service: Orthopedics;  Laterality: Left;  requests 59mns   REVERSE SHOULDER ARTHROPLASTY Left 01/30/2016   Procedure: LEFT REVERSE SHOULDER ARTHROPLASTY;  Surgeon: SNetta Cedars MD;  [place]  Service: Orthopedics;  Laterality: Left;   ROTATOR CUFF REPAIR Left    SHOULDER ARTHROSCOPY WITH DISTAL CLAVICLE RESECTION Right 01/30/2018   Procedure: RIGHT SHOULDER ARTHROSCOPY WITH OPEN DISTAL CLAVICLE RESECTION, SUBACROMIAL DECOMPRESSION;  Surgeon: NNetta Cedars MD;  Location[place]  Service: Orthopedics;  Laterality: Right;   STERIOD INJECTION Right 01/30/2018   Procedure: STEROID INJECTION;  Surgeon: NNetta Cedars MD;  Location[place]  Service: Orthopedics;  Laterality: Right;   TOTAL SHOULDER REVISION Left 01/17/2017   Procedure: LEFT SHOULDER REVISION REVERSE TOTAL SHOULDER ARTHROPLASTY;  Surgeon: NNetta Cedars MD;  [place]  Service: Orthopedics;  Laterality: Left;   WRIST SURGERY Right 11/2014   Dr. GAmedeo Plenty   Social History:  reports that he has been smoking cigarettes. He has a 32.25 pack-year smoking history.  He has never used smokeless tobacco. He reports current alcohol use of about 9.0 standard drinks of alcohol per week. He reports that he does not use drugs.  Allergies:  Allergies  Allergen Reactions   Methotrexate Derivatives Nausea And Vomiting and Other (See Comments)    Hallucinations and aggressive behavior     Tramadol Nausea And Vomiting    Hallucinations and aggressive behavior     (Not in a hospital admission)   Blood pressure 119/72, pulse 78, temperature 98.6 F (37 C), temperature source Oral,  resp. rate 18, SpO2 95 %. Physical Exam:  General: pleasant, WD, eldely male who is laying in bed in NAD HEENT: head is normocephalic, atraumatic.  Sclera are anicteric.  Ears and nose without any masses or lesions.  Mouth is pink and moist Heart: regular, rate, and rhythm.  Normal s1,s2. No obvious murmurs, gallops, or rubs noted.  Palpable radial and pedal pulses bilaterally Lungs: CTAB, no wheezes, rhonchi, or rales noted.  Respiratory effort nonlabored Abd: soft, very ttp in RUQ, ND, +BS, small reducible umbilical hernia  MS: all 4 extremities are symmetrical with no cyanosis, clubbing, or edema. Skin: warm and dry with no masses, lesions, or rashes Neuro: Cranial nerves 2-12 grossly intact, sensation is normal throughout Psych: A&Ox3 with an appropriate affect.   Results for orders placed or performed during the hospital encounter of 08/31/21 (from the past 48 hour(s))  CBC with Differential     Status: Abnormal   Collection Time: 08/31/21  9:47 AM  Result Value Ref Range   WBC 13.8 (H) 4.0 - 10.5 K/uL   RBC 4.69 4.22 - 5.81 MIL/uL   Hemoglobin 14.9 13.0 - 17.0 g/dL   HCT 44.0 39.0 - 52.0 %   MCV 93.8 80.0 - 100.0 fL   MCH 31.8 26.0 - 34.0 pg   MCHC 33.9 30.0 - 36.0 g/dL   RDW 13.5 11.5 - 15.5 %   Platelets 188 150 - 400 K/uL   nRBC 0.0 0.0 - 0.2 %   Neutrophils Relative % 77 %   Neutro Abs 10.5 (H) 1.7 - 7.7 K/uL   Lymphocytes Relative 14 %   Lymphs Abs 2.0 0.7 - 4.0 K/uL   Monocytes Relative 8 %   Monocytes Absolute 1.1 (H) 0.1 - 1.0 K/uL   Eosinophils Relative 1 %   Eosinophils Absolute 0.1 0.0 - 0.5 K/uL   Basophils Relative 0 %   Basophils Absolute 0.0 0.0 - 0.1 K/uL   Immature Granulocytes 0 %   Abs Immature Granulocytes 0.03 0.00 - 0.07 K/uL    Comment: Performed at [facility], [place] [address] [place], [place] [address]  Comprehensive metabolic panel     Status: Abnormal   Collection Time: 08/31/21  9:47 AM  Result Value Ref Range   Sodium  136 135 - 145 mmol/L   Potassium 4.2 3.5 - 5.1 mmol/L   Chloride 100 98 - 111 mmol/L   CO2 29 22 - 32 mmol/L   Glucose, Bld 136 (H) 70 - 99 mg/dL    Comment: Glucose reference range applies only to samples taken after fasting for at least 8 hours.   BUN 10 8 - 23 mg/dL   Creatinine, Ser 1.12 0.61 - 1.24 mg/dL   Calcium 8.6 (L) 8.9 - 10.3 mg/dL   Total Protein 6.8 6.5 - 8.1 g/dL   Albumin 3.4 (L) 3.5 - 5.0 g/dL   AST 16 15 - 41 U/L   ALT 23 0 -  44 U/L   Alkaline Phosphatase 61 38 - 126 U/L   Total Bilirubin 1.3 (H) 0.3 - 1.2 mg/dL   GFR, Estimated >60 >60 mL/min    Comment: (NOTE) Calculated using the CKD-EPI Creatinine Equation (2021)    Anion gap 7 5 - 15    Comment: Performed at [facility], [place] [address], [place], [place] [address]  Lipase, blood     Status: None   Collection Time: 08/31/21  9:47 AM  Result Value Ref Range   Lipase 20 11 - 51 U/L    Comment: Performed at [facility], [place] [address], [place], [place] [address]  Urinalysis, Routine w reflex microscopic     Status: Abnormal   Collection Time: 08/31/21  9:47 AM  Result Value Ref Range   Color, Urine AMBER (A) YELLOW    Comment: BIOCHEMICALS MAY BE AFFECTED BY COLOR   APPearance CLEAR CLEAR   Specific Gravity, Urine 1.020 1.005 - 1.030   pH 7.0 5.0 - 8.0   Glucose, UA NEGATIVE NEGATIVE mg/dL   Hgb urine dipstick NEGATIVE NEGATIVE   Bilirubin Urine NEGATIVE NEGATIVE   Ketones, ur 5 (A) NEGATIVE mg/dL   Protein, ur 30 (A) NEGATIVE mg/dL   Nitrite NEGATIVE NEGATIVE   Leukocytes,Ua NEGATIVE NEGATIVE   RBC / HPF 0-5 0 - 5 RBC/hpf   WBC, UA 0-5 0 - 5 WBC/hpf   Bacteria, UA NONE SEEN NONE SEEN   Mucus PRESENT    Hyaline Casts, UA PRESENT     Comment: Performed at [facility], [place] [address] [place], [place] [address]  Lactic acid, plasma     Status: None   Collection Time: 08/31/21 10:00 AM  Result Value Ref Range   Lactic Acid, Venous 1.4 0.5 -  1.9 mmol/L    Comment: Performed at [facility], [place] [address], [place], [place] [address]   CT ABDOMEN PELVIS W CONTRAST  Result Date: 08/31/2021 CLINICAL DATA:  Abdominal pain, acute, nonlocalized EXAM: CT ABDOMEN AND PELVIS WITH CONTRAST TECHNIQUE: Multidetector CT imaging of the abdomen and pelvis was performed using the standard protocol following bolus administration of intravenous contrast. RADIATION DOSE REDUCTION: This exam was performed according to the departmental dose-optimization program which includes automated exposure control, adjustment of the mA and/or kV according to patient size and/or use of iterative reconstruction technique. CONTRAST:  163m OMNIPAQUE IOHEXOL 300 MG/ML  SOLN COMPARISON:  November 2022 FINDINGS: Lower chest: No acute abnormality. Fibrotic changes at the lung bases. Hepatobiliary: Liver is unremarkable. Gallbladder is distended with significant surrounding infiltration. No stone identified. Pancreas: Unremarkable. Spleen: Unremarkable. Adrenals/Urinary Tract: Adrenals, kidneys, and partially distended bladder are unremarkable. Stomach/Bowel: Stomach is within normal limits. Bowel is normal in caliber. Distal colonic diverticulosis. Normal appendix. Vascular/Lymphatic: Atherosclerosis.  No enlarged nodes. Reproductive: Stable appearance of the prostate. Other: Trace free fluid in the dependent pelvis. Umbilical hernia containing a small portion of small bowel together with fat. Musculoskeletal: No acute osseous abnormality. IMPRESSION: Acute cholecystitis.  No stone identified by CT. Umbilical hernia contains fat and a small portion of small bowel. These results were called by telephone at the time of interpretation on 08/31/2021 at 11:44 am to provider [facility], who verbally acknowledged these results. Electronically Signed   By: PMacy MisM.D.   On: 08/31/2021 11:44   DG Chest Portable 1 View  Result Date: 08/31/2021 CLINICAL DATA:   71year old male with history of pain in the upper abdomen and  some nausea. Evaluate for potential free air. EXAM: PORTABLE CHEST 1 VIEW COMPARISON:  Chest x-ray 08/04/2021. FINDINGS: Lung volumes are normal. Diffuse interstitial prominence and peribronchial cuffing, similar to the prior study. No consolidative airspace disease. No pleural effusions. No pneumothorax. No pulmonary nodule or mass noted. Pulmonary vasculature and the cardiomediastinal silhouette are within normal limits. Atherosclerosis in the thoracic aorta. Status post left shoulder arthroplasty. No definite pneumoperitoneum. IMPRESSION: 1. No definite pneumoperitoneum. 2. Diffuse interstitial prominence and peribronchial cuffing, similar to prior examinations, suggesting chronic bronchitis. 3. Aortic atherosclerosis. Electronically Signed   By: [name] M.D.   On: 08/31/2021 10:44      Assessment/Plan Acute cholecystitis  - CT without cholelithiasis but significant inflammation around gallbladder - exam and history consistent with acute cholecystitis - Tbili mildly elevated at 1.3, LFTs otherwise unremarkable - mild leukocytosis of 13.8 -  I have explained the procedure, risks, and aftercare of Laparoscopic cholecystectomy with IOC.  Risks include but are not limited to anesthesia (MI, CVA, death), bleeding, infection, wound problems, hernia, bile leak, injury to common bile duct/liver/intestine, increased risk of DVT/PE and diarrhea post op.  He seems to understand and agrees to proceed.  - likely admit to observation post-op, can have TRH see post op if any acute concerns  FEN: NPO, IVF VTE: SCDs ID: [id number] 6/12>>   I reviewed ED provider notes, last 24 h vitals and pain scores, last 48 h intake and output, last 24 h labs and trends, and last 24 h imaging results.   Norm Parcel, [facility] 08/31/2021, 12:20 PM Please see Amion for pager number during day hours 7:00am-4:30pm

## 2021-08-31 NOTE — Discharge Instructions (Signed)
[facility] LAPAROSCOPIC SURGERY: POST OP INSTRUCTIONS Always review your discharge instruction sheet given to you by the facility where your surgery was performed. IF YOU HAVE DISABILITY OR FAMILY LEAVE FORMS, YOU MUST BRING THEM TO THE OFFICE FOR PROCESSING.   DO NOT GIVE THEM TO YOUR DOCTOR.  PAIN CONTROL  First take acetaminophen (Tylenol) AND/or ibuprofen (Advil) to control your pain after surgery.  Follow directions on package.  Taking acetaminophen (Tylenol) and/or ibuprofen (Advil) regularly after surgery will help to control your pain and lower the amount of prescription pain medication you may need.  You should not take more than 3,000 mg (3 grams) of acetaminophen (Tylenol) in 24 hours.  You should not take ibuprofen (Advil), aleve, motrin, naprosyn or other NSAIDS if you have a history of stomach ulcers or chronic kidney disease.  A prescription for pain medication may be given to you upon discharge.  Take your pain medication as prescribed, if you still have uncontrolled pain after taking acetaminophen (Tylenol) or ibuprofen (Advil). Use ice packs to help control pain. If you need a refill on your pain medication, please contact your pharmacy.  They will contact our office to request authorization. Prescriptions will not be filled after 5pm or on week-ends.  HOME MEDICATIONS Take your usually prescribed medications unless otherwise directed.  DIET You should follow a light diet the first few days after arrival home.  Be sure to include lots of fluids daily. Avoid fatty, fried foods.   CONSTIPATION It is common to experience some constipation after surgery and if you are taking pain medication.  Increasing fluid intake and taking a stool softener (such as Colace) will usually help or prevent this problem from occurring.  A mild laxative (Milk of Magnesia or Miralax) should be taken according to package instructions if there are no bowel movements after 48  hours.  WOUND/INCISION CARE Most patients will experience some swelling and bruising in the area of the incisions.  Ice packs will help.  Swelling and bruising can take several days to resolve.  Unless discharge instructions indicate otherwise, follow guidelines below  STERI-STRIPS - you may remove your outer bandages 48 hours after surgery, and you may shower at that time.  You have steri-strips (small skin tapes) in place directly over the incision.  These strips should be left on the skin for 7-10 days.   DERMABOND/SKIN GLUE - you may shower in 24 hours.  The glue will flake off over the next 2-3 weeks. Any sutures or staples will be removed at the office during your follow-up visit.  ACTIVITIES You may resume regular (light) daily activities beginning the next day--such as daily self-care, walking, climbing stairs--gradually increasing activities as tolerated.  You may have sexual intercourse when it is comfortable.  Refrain from any heavy lifting or straining until approved by your doctor. You may drive when you are no longer taking prescription pain medication, you can comfortably wear a seatbelt, and you can safely maneuver your car and apply brakes.  FOLLOW-UP You should see your doctor in the office for a follow-up appointment approximately 2-3 weeks after your surgery.  You should have been given your post-op/follow-up appointment when your surgery was scheduled.  If you did not receive a post-op/follow-up appointment, make sure that you call for this appointment within a day or two after you arrive home to insure a convenient appointment time.   WHEN TO CALL YOUR DOCTOR: Fever over 101.0 Inability to urinate Continued bleeding from incision.   Increased pain, redness, or drainage from the incision. Increasing abdominal pain  The clinic staff is available to answer your questions during regular business hours.  Please don't hesitate to call and ask to speak to one of the nurses for  clinical concerns.  If you have a medical emergency, go to the nearest emergency room or call 911.  A surgeon from [facility] is always on call at the hospital. [address], [address], [place], [place]  [address] ? [address] [address], [place], [place]   [address] [phone] ? [phone] ? FAX [phone] Web site: [email or web address]  

## 2021-08-31 NOTE — ED Provider Notes (Signed)
[facility] DEPT Provider Note  CSN: [id number] Arrival date & time: 08/31/21 0907  Chief Complaint(s) Abdominal Pain  HPI Frederick Guzman is a 71 y.o. male with PMH T2DM, diverticulitis, HTN, HLD, COPD who presents emergency department for evaluation of abdominal pain.  Patient states that this pain started 3 days ago in the right upper quadrant and has progressively worsened.  States his pain significantly worsens after eating.  He went to urgent care who transferred the patient to the emergency department due to concerning abdominal exam.  On arrival, patient appears very uncomfortable clutching his right upper quadrant.  He endorses nausea but denies vomiting, headache, fever or other systemic symptoms.   Past Medical History Past Medical History:  Diagnosis Date   Allergy    Arthritis    OA   Asthma    Cancer ([place]) 08/2014   melanoma on R shoulder & back - [facility]    COPD (chronic obstructive pulmonary disease) ([place])    Depression    pt. denies- 01/2016, pt on Wellbutrin for smoking cessation   Diabetes mellitus without complication ([place])    Diverticulitis    Dyspnea    with exertion   Fibromyalgia    GERD (gastroesophageal reflux disease)    Hyperlipemia    Hypertension    "never was high"   Lymphocytic colitis 04/2018   Pneumonia 06/2015, 10/2016   Sleep apnea    last study - Nov. 2017, waiting for a new machine, using the old CPAP q night after cleaning the device after he was documented to have pneumonia from his CPAP machine.    Patient Active Problem List   Diagnosis Date Noted   COPD with exacerbation ([place]) 04/13/2021   Hyperlipidemia 08/07/2020   Combined forms of age-related cataract of right eye 09/21/2018   Testosterone deficiency in male 04/10/2018   Pulmonary nodules 10/23/2017   Sepsis, unspecified organism ([place]) 10/23/2017   Pain in right hand 08/04/2017   Long term use of drug 04/19/2017   Pain in joint of left  shoulder 04/19/2017   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   Positive depression screening 09/21/2016   Uncontrolled type 2 diabetes mellitus with hyperglycemia, without long-term current use of insulin ([place]) 09/21/2016   Status post shoulder surgery 08/09/2016   Fibromyalgia 05/21/2016   S/P shoulder replacement, left 01/30/2016   Depression    HTN (hypertension)    BPH (benign prostatic hyperplasia) 06/17/2011   Hypogonadism male 06/17/2011   Arthritis of wrist 05/28/2011   MDD (major depressive disorder) ([place]) 04/30/2011   GERD (gastroesophageal reflux disease) 11/23/2010   DERMATITIS, ATOPIC 11/23/2010   COPD (chronic obstructive pulmonary disease) ([place]) 10/07/2010   TOBACCO ABUSE 03/10/2007   OBSTRUCTIVE SLEEP APNEA 03/10/2007   BRONCHITIS, CHRONIC 03/10/2007   ASTHMA, MODERATE 03/10/2007   Home Medication(s) Prior to Admission medications   Medication Sig Start Date End Date Taking? Authorizing Provider  albuterol (PROVENTIL) (2.5 MG/3ML) 0.083% nebulizer solution Take 2.5 mg by nebulization every 6 (six) hours as needed for wheezing or shortness of breath.    [provider]  albuterol (VENTOLIN HFA) 108 (90 Base) MCG/ACT inhaler Inhale 2 puffs into the lungs every 6 (six) hours as needed for wheezing or shortness of breath.    [provider]  atorvastatin (LIPITOR) 20 MG tablet Take 1 tablet (20 mg total) by mouth every other day. 02/17/21   Hali Marry, MD  b complex vitamins tablet Take 1 tablet by mouth daily.    [provider]  Budeson-Glycopyrrol-Formoterol (BREZTRI AEROSPHERE) 160-9-4.8 MCG/ACT AERO Inhale into the lungs. 11/14/19   [provider]  buPROPion (WELLBUTRIN XL) 150 MG 24 hr tablet Take 1 tablet (150 mg total) by mouth daily. 08/04/21   Luetta Nutting, DO  cetirizine (ZYRTEC) 10 MG tablet Take 10 mg by mouth daily as needed for allergies.     [provider]  esomeprazole (NEXIUM) 40 MG capsule Take 1 capsule  (40 mg total) by mouth daily. NO REFILLS. NEEDS TO TRANSITION CARE TO NEW PCP. 06/15/21   Luetta Nutting, DO  fluticasone (FLONASE) 50 MCG/ACT nasal spray Place 1 spray into both nostrils daily as needed for allergies. 12/30/15   [provider]  furosemide (LASIX) 20 MG tablet Take 1 tablet (20 mg total) by mouth 2 (two) times daily as needed for edema. 07/24/21   Luetta Nutting, DO  glucose blood (PRODIGY NO CODING BLOOD GLUC) test strip Use up to 4 times per day as directed with glucometer. 09/17/20   [name], DO  Insulin Pen Needle 32G X 4 MM MISC For use with Lantus Solostar Patient taking differently: For use with Lantus Solostar. 60 units of insulin 09/15/20   Trixie Dredge, PA-C  LANTUS SOLOSTAR 100 UNIT/ML Solostar Pen Inject into the skin. 08/03/21   [provider]  lisinopril (ZESTRIL) 2.5 MG tablet Take 1 tablet (2.5 mg total) by mouth at bedtime. 08/20/21   Breeback, Royetta Car, PA-C  Multiple Vitamin (MULTIVITAMIN WITH MINERALS) TABS tablet Take 1 tablet by mouth at bedtime.    [provider]  Omega-3 Fatty Acids (FISH OIL) 1000 MG CAPS Take 1,000 mg by mouth at bedtime.     [provider]  Polyvinyl Alcohol-Povidone (REFRESH OP) Place 1 drop into both eyes daily as needed (dry eyes).    [provider]  tacrolimus (PROTOPIC) 0.1 % ointment Apply 1 application topically daily. 02/05/19   [provider]  tadalafil (CIALIS) 5 MG tablet Take 5 mg by mouth daily. 12/18/20   [provider]  tamsulosin (FLOMAX) 0.4 MG CAPS capsule Take 0.4 mg by mouth.    [provider]  Testosterone 30 MG/ACT SOLN APPLY 1-2 PUMPS DAILY. ALTERNATE TAKING ONE PUMP UNDER ONE ARM ONE DAY, TWO PUMPS THE NEXT DAY (ONE PUMP UNDER EACH ARM) Patient taking differently: Apply 1-2 Pump topically See admin instructions. Apply 1 pump under ONE arm for 1 day- ALTERNATING - 1 pump under each arm for 1 day 04/14/21   Luetta Nutting, DO   vitamin C (ASCORBIC ACID) 500 MG tablet Take 500 mg by mouth daily.    [provider]                                                                                                                                    Past Surgical History Past Surgical History:  Procedure Laterality Date   carpel tunnel Bilateral  COLONOSCOPY W/ POLYPECTOMY     COLONOSCOPY W/ POLYPECTOMY     EXCISIONAL TOTAL SHOULDER ARTHROPLASTY WITH ANTIBIOTIC SPACER Left 08/09/2016   Procedure: LEFT SHOULDER HARDWARE REMOVAL, IRRIGATION AND DEBRIDEMENT, PLACEMENT OF ANTIBIOTIC SPACER;  Surgeon: Netta Cedars, MD;  Location: [facility];  Service: Orthopedics;  Laterality: Left;   IRRIGATION AND DEBRIDEMENT SHOULDER Left 03/31/2016   Procedure: IRRIGATION AND DEBRIDEMENT SHOULDER, deep cultures and polyethylene exchange;  Surgeon: Netta Cedars, MD;  Location: [facility];  Service: Orthopedics;  Laterality: Left;  requests 12mns   REVERSE SHOULDER ARTHROPLASTY Left 01/30/2016   Procedure: LEFT REVERSE SHOULDER ARTHROPLASTY;  Surgeon: SNetta Cedars MD;  [facility]  Service: Orthopedics;  Laterality: Left;   ROTATOR CUFF REPAIR Left    SHOULDER ARTHROSCOPY WITH DISTAL CLAVICLE RESECTION Right 01/30/2018   Procedure: RIGHT SHOULDER ARTHROSCOPY WITH OPEN DISTAL CLAVICLE RESECTION, SUBACROMIAL DECOMPRESSION;  Surgeon: NNetta Cedars MD;  [facility]  Service: Orthopedics;  Laterality: Right;   STERIOD INJECTION Right 01/30/2018   Procedure: STEROID INJECTION;  Surgeon: NNetta Cedars MD;  [facility]  Service: Orthopedics;  Laterality: Right;   TOTAL SHOULDER REVISION Left 01/17/2017   Procedure: LEFT SHOULDER REVISION REVERSE TOTAL SHOULDER ARTHROPLASTY;  Surgeon: NNetta Cedars MD;  [place] [facility]  Service: Orthopedics;  Laterality: Left;   WRIST SURGERY Right 11/2014   Dr. GAmedeo Plenty  Family History Family History  Problem Relation Age of Onset   Diabetes Mother    Colon cancer Father    Prostate cancer  Father    Diabetes Father    Esophageal cancer Neg Hx    Rectal cancer Neg Hx    Stomach cancer Neg Hx     Social History Social History   Tobacco Use   Smoking status: Every Day    Packs/day: 0.75    Years: 43.00    Total pack years: 32.25    Types: Cigarettes   Smokeless tobacco: Never   Tobacco comments:    pt no longer taking chantix  Vaping Use   Vaping Use: Never used  Substance Use Topics   Alcohol use: Yes    Alcohol/week: 9.0 standard drinks of alcohol    Types: 6 Cans of beer, 3 Shots of liquor per week    Comment: beer or mixed drink    Drug use: No   Allergies Methotrexate derivatives and Tramadol  Review of Systems Review of Systems  Gastrointestinal:  Positive for abdominal pain and nausea.    Physical Exam Vital Signs  I have reviewed the triage vital signs BP 119/72   Pulse 78   Temp 98.6 F (37 C) (Oral)   Resp 18   SpO2 95%   Physical Exam Constitutional:      General: He is in acute distress.     Appearance: Normal appearance. He is ill-appearing.  HENT:     Head: Normocephalic and atraumatic.     Nose: No congestion or rhinorrhea.  Eyes:     General:        Right eye: No discharge.        Left eye: No discharge.     Extraocular Movements: Extraocular movements intact.     Pupils: Pupils are equal, round, and reactive to light.  Cardiovascular:     Rate and Rhythm: Normal rate and regular rhythm.     Heart sounds: No murmur heard. Pulmonary:     Effort: No respiratory distress.     Breath sounds: No wheezing or rales.  Abdominal:  General: There is no distension.     Tenderness: There is abdominal tenderness in the right upper quadrant.  Musculoskeletal:        General: Normal range of motion.     Cervical back: Normal range of motion.  Skin:    General: Skin is warm and dry.  Neurological:     General: No focal deficit present.     Mental Status: He is alert.     ED Results and Treatments Labs (all labs ordered  are listed, but only abnormal results are displayed) Labs Reviewed  CBC WITH DIFFERENTIAL/PLATELET - Abnormal; Notable for the following components:      Result Value   WBC 13.8 (*)    Neutro Abs 10.5 (*)    Monocytes Absolute 1.1 (*)    All other components within normal limits  COMPREHENSIVE METABOLIC PANEL - Abnormal; Notable for the following components:   Glucose, Bld 136 (*)    Calcium 8.6 (*)    Albumin 3.4 (*)    Total Bilirubin 1.3 (*)    All other components within normal limits  URINALYSIS, ROUTINE W REFLEX MICROSCOPIC - Abnormal; Notable for the following components:   Color, Urine AMBER (*)    Ketones, ur 5 (*)    Protein, ur 30 (*)    All other components within normal limits  LIPASE, BLOOD  LACTIC ACID, PLASMA  LACTIC ACID, PLASMA                                                                                                                          Radiology CT ABDOMEN PELVIS W CONTRAST  Result Date: 08/31/2021 CLINICAL DATA:  Abdominal pain, acute, nonlocalized EXAM: CT ABDOMEN AND PELVIS WITH CONTRAST TECHNIQUE: Multidetector CT imaging of the abdomen and pelvis was performed using the standard protocol following bolus administration of intravenous contrast. RADIATION DOSE REDUCTION: This exam was performed according to the departmental dose-optimization program which includes automated exposure control, adjustment of the mA and/or kV according to patient size and/or use of iterative reconstruction technique. CONTRAST:  169m OMNIPAQUE IOHEXOL 300 MG/ML  SOLN COMPARISON:  November 2022 FINDINGS: Lower chest: No acute abnormality. Fibrotic changes at the lung bases. Hepatobiliary: Liver is unremarkable. Gallbladder is distended with significant surrounding infiltration. No stone identified. Pancreas: Unremarkable. Spleen: Unremarkable. Adrenals/Urinary Tract: Adrenals, kidneys, and partially distended bladder are unremarkable. Stomach/Bowel: Stomach is within normal  limits. Bowel is normal in caliber. Distal colonic diverticulosis. Normal appendix. Vascular/Lymphatic: Atherosclerosis.  No enlarged nodes. Reproductive: Stable appearance of the prostate. Other: Trace free fluid in the dependent pelvis. Umbilical hernia containing a small portion of small bowel together with fat. Musculoskeletal: No acute osseous abnormality. IMPRESSION: Acute cholecystitis.  No stone identified by CT. Umbilical hernia contains fat and a small portion of small bowel. These results were called by telephone at the time of interpretation on 08/31/2021 at 11:44 am to provider [facility], who verbally acknowledged these results. Electronically Signed   By: PMalachi Carl  Patel M.D.   On: 08/31/2021 11:44   DG Chest Portable 1 View  Result Date: 08/31/2021 CLINICAL DATA:  71 year old male with history of pain in the upper abdomen and some nausea. Evaluate for potential free air. EXAM: PORTABLE CHEST 1 VIEW COMPARISON:  Chest x-ray 08/04/2021. FINDINGS: Lung volumes are normal. Diffuse interstitial prominence and peribronchial cuffing, similar to the prior study. No consolidative airspace disease. No pleural effusions. No pneumothorax. No pulmonary nodule or mass noted. Pulmonary vasculature and the cardiomediastinal silhouette are within normal limits. Atherosclerosis in the thoracic aorta. Status post left shoulder arthroplasty. No definite pneumoperitoneum. IMPRESSION: 1. No definite pneumoperitoneum. 2. Diffuse interstitial prominence and peribronchial cuffing, similar to prior examinations, suggesting chronic bronchitis. 3. Aortic atherosclerosis. Electronically Signed   By: [name] M.D.   On: 08/31/2021 10:44    Pertinent labs & imaging results that were available during my care of the patient were reviewed by me and considered in my medical decision making (see MDM for details).  Medications Ordered in ED Medications  piperacillin-tazobactam (ZOSYN) IVPB 3.375 g (3.375 g  Intravenous New Bag/Given 08/31/21 1214)  lactated ringers infusion (has no administration in time range)  chlorhexidine (PERIDEX) 0.12 % solution 15 mL (has no administration in time range)    Or  MEDLINE mouth rinse (has no administration in time range)  morphine (PF) 4 MG/ML injection 4 mg (4 mg Intravenous Given 08/31/21 1006)  ondansetron (ZOFRAN) injection 4 mg (4 mg Intravenous Given 08/31/21 1006)  lactated ringers bolus 1,000 mL (1,000 mLs Intravenous New Bag/Given 08/31/21 1013)  iohexol (OMNIPAQUE) 300 MG/ML solution 100 mL (100 mLs Intravenous Contrast Given 08/31/21 1123)                                                                                                                                     Procedures .Critical Care  Performed by: Teressa Lower, MD Authorized by: Teressa Lower, MD   Critical care provider statement:    Critical care time (minutes):  30   Critical care was time spent personally by me on the following activities:  Development of treatment plan with patient or surrogate, discussions with consultants, evaluation of patient's response to treatment, examination of patient, ordering and review of laboratory studies, ordering and review of radiographic studies, ordering and performing treatments and interventions, pulse oximetry, re-evaluation of patient's condition and review of old charts   (including critical care time)  Medical Decision Making / ED Course   This patient presents to the ED for concern of abdominal pain, this involves an extensive number of treatment options, and is a complaint that carries with it a high risk of complications and morbidity.  The differential diagnosis includes cholecystitis, choledocholithiasis, cholangitis, pancreatitis, hepatitis  MDM: Patient seen in the emergency department for evaluation of right upper quadrant abdominal pain.  Physical exam reveals a positive Murphy sign and significant tenderness in the right  upper quadrant.  Physical exam otherwise unremarkable.  Laboratory evaluation with leukocytosis to 68.1 with neutrophilic predominance but is otherwise unremarkable.  Total bili elevated to 1.3 but LFTs are normal.  Upright chest performed with no free air.  CT abdomen pelvis with contrast with acute cholecystitis.  Zosyn started and surgery consulted.  Patient will require surgical intervention today.  Patient admitted to the surgery service with plans for cholecystectomy today.   Additional history obtained: -Additional history obtained from wife -External records from outside source obtained and reviewed including: Chart review including previous notes, labs, imaging, consultation notes   Lab Tests: -I ordered, reviewed, and interpreted labs.   The pertinent results include:   Labs Reviewed  CBC WITH DIFFERENTIAL/PLATELET - Abnormal; Notable for the following components:      Result Value   WBC 13.8 (*)    Neutro Abs 10.5 (*)    Monocytes Absolute 1.1 (*)    All other components within normal limits  COMPREHENSIVE METABOLIC PANEL - Abnormal; Notable for the following components:   Glucose, Bld 136 (*)    Calcium 8.6 (*)    Albumin 3.4 (*)    Total Bilirubin 1.3 (*)    All other components within normal limits  URINALYSIS, ROUTINE W REFLEX MICROSCOPIC - Abnormal; Notable for the following components:   Color, Urine AMBER (*)    Ketones, ur 5 (*)    Protein, ur 30 (*)    All other components within normal limits  LIPASE, BLOOD  LACTIC ACID, PLASMA  LACTIC ACID, PLASMA       Imaging Studies ordered: I ordered imaging studies including chest x-ray, CT abdomen pelvis I independently visualized and interpreted imaging. I agree with the radiologist interpretation   Medicines ordered and prescription drug management: Meds ordered this encounter  Medications   morphine (PF) 4 MG/ML injection 4 mg   ondansetron (ZOFRAN) injection 4 mg   lactated ringers bolus 1,000 mL    iohexol (OMNIPAQUE) 300 MG/ML solution 100 mL   piperacillin-tazobactam (ZOSYN) IVPB 3.375 g    Order Specific Question:   Antibiotic Indication:    Answer:   Intra-abdominal Infection   lactated ringers infusion   OR Linked Order Group    chlorhexidine (PERIDEX) 0.12 % solution 15 mL    MEDLINE mouth rinse    -I have reviewed the patients home medicines and have made adjustments as needed  Critical interventions Surgery consultation, antibiotics, pain control  Consultations Obtained: I requested consultation with the general surgeons,  and discussed lab and imaging findings as well as pertinent plan - they recommend: Admission for surgery   Cardiac Monitoring: The patient was maintained on a cardiac monitor.  I personally viewed and interpreted the cardiac monitored which showed an underlying rhythm of: NSR  Social Determinants of Health:  Factors impacting patients care include: none   Reevaluation: After the interventions noted above, I reevaluated the patient and found that they have :improved  Co morbidities that complicate the patient evaluation  Past Medical History:  Diagnosis Date   Allergy    Arthritis    OA   Asthma    Cancer ([place]) 08/2014   melanoma on R shoulder & back - [facility]    COPD (chronic obstructive pulmonary disease) ([place])    Depression    pt. denies- 01/2016, pt on Wellbutrin for smoking cessation   Diabetes mellitus without complication ([place])    Diverticulitis    Dyspnea    with exertion   Fibromyalgia    GERD (  gastroesophageal reflux disease)    Hyperlipemia    Hypertension    "never was high"   Lymphocytic colitis 04/2018   Pneumonia 06/2015, 10/2016   Sleep apnea    last study - Nov. 2017, waiting for a new machine, using the old CPAP q night after cleaning the device after he was documented to have pneumonia from his CPAP machine.       Dispostion: I considered admission for this patient, and given acute cholecystitis patient  require admission     Final Clinical Impression(s) / ED Diagnoses Final diagnoses:  None     '@PCDICTATION'$ @    Teressa Lower, MD 08/31/21 1250

## 2021-08-31 NOTE — Discharge Instructions (Addendum)
Your severe abdominal pain is due to peritonitis, which is likely due to infection or organ rupture in your abdomen. THIS IS A VERY SERIOUS CONDITION and need IMMEDIATE EVALUATE IN THE ER.  Please head to [facility] ER. EMS transport recommended, however you are stable enough for personal transport. Please head there immediately.

## 2021-08-31 NOTE — ED Triage Notes (Signed)
Pt reports abdominal pain, nausea, and distention since Saturday. Pt reports thinking he was constipated and took stool softeners without relief. Pt was [facility] from [facility] for [facility] and stated they were concerned for infection or organ rupture.

## 2021-08-31 NOTE — Anesthesia Postprocedure Evaluation (Signed)
Anesthesia Post Note  Patient: Frederick Guzman  Procedure(s) Performed: LAPAROSCOPIC CHOLECYSTECTOMY AND UMBILICAL HERNIA REPAIR HERNIA REPAIR UMBILICAL ADULT possible     Patient location during evaluation: PACU Anesthesia Type: General Level of consciousness: awake Pain management: pain level controlled Vital Signs Assessment: post-procedure vital signs reviewed and stable Respiratory status: spontaneous breathing Cardiovascular status: stable Postop Assessment: no apparent nausea or vomiting Anesthetic complications: no   No notable events documented.  Last Vitals:  Vitals:   08/31/21 1308 08/31/21 1530  BP: 112/61 102/88  Pulse: 80 98  Resp: 18 16  Temp: 36.8 C 37.1 C  SpO2: 96% 95%    Last Pain:  Vitals:   08/31/21 1530  TempSrc:   PainSc: 10-Worst pain ever                 Charlise Giovanetti

## 2021-09-01 ENCOUNTER — Encounter (HOSPITAL_COMMUNITY): Payer: Self-pay | Admitting: Surgery

## 2021-09-01 LAB — COMPREHENSIVE METABOLIC PANEL
ALT: 50 U/L — ABNORMAL HIGH (ref 0–44)
AST: 51 U/L — ABNORMAL HIGH (ref 15–41)
Albumin: 3 g/dL — ABNORMAL LOW (ref 3.5–5.0)
Alkaline Phosphatase: 61 U/L (ref 38–126)
Anion gap: 4 — ABNORMAL LOW (ref 5–15)
BUN: 12 mg/dL (ref 8–23)
CO2: 30 mmol/L (ref 22–32)
Calcium: 8.3 mg/dL — ABNORMAL LOW (ref 8.9–10.3)
Chloride: 102 mmol/L (ref 98–111)
Creatinine, Ser: 1.08 mg/dL (ref 0.61–1.24)
GFR, Estimated: >60 mL/min (ref >60)
Glucose, Bld: 153 mg/dL — ABNORMAL HIGH (ref 70–99)
Potassium: 4.6 mmol/L (ref 3.5–5.1)
Sodium: 136 mmol/L (ref 135–145)
Total Bilirubin: 1.1 mg/dL (ref 0.3–1.2)
Total Protein: 6.1 g/dL — ABNORMAL LOW (ref 6.5–8.1)

## 2021-09-01 LAB — PROTIME-INR
INR: 1.3 — ABNORMAL HIGH (ref 0.8–1.2)
Prothrombin Time: 15.9 seconds — ABNORMAL HIGH (ref 11.4–15.2)

## 2021-09-01 LAB — CBC
HCT: 38.5 % — ABNORMAL LOW (ref 39.0–52.0)
Hemoglobin: 13.1 g/dL (ref 13.0–17.0)
MCH: 32.3 pg (ref 26.0–34.0)
MCHC: 34 g/dL (ref 30.0–36.0)
MCV: 95.1 fL (ref 80.0–100.0)
Platelets: 150 10*3/uL (ref 150–400)
RBC: 4.05 MIL/uL — ABNORMAL LOW (ref 4.22–5.81)
RDW: 13.1 % (ref 11.5–15.5)
WBC: 14.1 10*3/uL — ABNORMAL HIGH (ref 4.0–10.5)
nRBC: 0 % (ref 0.0–0.2)

## 2021-09-01 LAB — GLUCOSE, CAPILLARY
Glucose-Capillary: 115 mg/dL — ABNORMAL HIGH (ref 70–99)
Glucose-Capillary: 140 mg/dL — ABNORMAL HIGH (ref 70–99)
Glucose-Capillary: 164 mg/dL — ABNORMAL HIGH (ref 70–99)

## 2021-09-01 LAB — SURGICAL PATHOLOGY

## 2021-09-01 MED ORDER — OXYCODONE HCL 5 MG PO TABS
5.0000 mg | ORAL_TABLET | Freq: Four times a day (QID) | ORAL | 0 refills | Status: DC | PRN
Start: 2021-09-01 — End: 2021-12-08

## 2021-09-01 MED ORDER — ACETAMINOPHEN 500 MG PO TABS: 1000.0000 mg | ORAL_TABLET | Freq: Four times a day (QID) | ORAL | Status: DC | PRN

## 2021-09-01 MED ORDER — DOCUSATE SODIUM 100 MG PO CAPS: 100.0000 mg | ORAL_CAPSULE | Freq: Every day | ORAL | Status: DC | PRN

## 2021-09-01 NOTE — Progress Notes (Signed)
Nurse reviewed discharge instructions with pt.  Pt verbalized understanding of discharge instructions, follow up appointments and new medication.  No concerns at time of discharge.  Pt wanted to wait downstairs for his ride to get here.

## 2021-09-01 NOTE — Discharge Summary (Signed)
[facility] Discharge Summary   Patient ID: Frederick Guzman MRN: [id number] DOB/AGE: [birth year] 72 y.o.  Admit date: 08/31/2021 Discharge date: 09/01/2021  Admitting Diagnosis: Acute cholecystitis   Discharge Diagnosis Acute cholecystitis s/p laparoscopic cholecystectomy   Consultants None   Imaging: CT ABDOMEN PELVIS W CONTRAST  Result Date: 08/31/2021 CLINICAL DATA:  Abdominal pain, acute, nonlocalized EXAM: CT ABDOMEN AND PELVIS WITH CONTRAST TECHNIQUE: Multidetector CT imaging of the abdomen and pelvis was performed using the standard protocol following bolus administration of intravenous contrast. RADIATION DOSE REDUCTION: This exam was performed according to the departmental dose-optimization program which includes automated exposure control, adjustment of the mA and/or kV according to patient size and/or use of iterative reconstruction technique. CONTRAST:  155m OMNIPAQUE IOHEXOL 300 MG/ML  SOLN COMPARISON:  November 2022 FINDINGS: Lower chest: No acute abnormality. Fibrotic changes at the lung bases. Hepatobiliary: Liver is unremarkable. Gallbladder is distended with significant surrounding infiltration. No stone identified. Pancreas: Unremarkable. Spleen: Unremarkable. Adrenals/Urinary Tract: Adrenals, kidneys, and partially distended bladder are unremarkable. Stomach/Bowel: Stomach is within normal limits. Bowel is normal in caliber. Distal colonic diverticulosis. Normal appendix. Vascular/Lymphatic: Atherosclerosis.  No enlarged nodes. Reproductive: Stable appearance of the prostate. Other: Trace free fluid in the dependent pelvis. Umbilical hernia containing a small portion of small bowel together with fat. Musculoskeletal: No acute osseous abnormality. IMPRESSION: Acute cholecystitis.  No stone identified by CT. Umbilical hernia contains fat and a small portion of small bowel. These results were called by telephone at the time of interpretation on 08/31/2021 at 11:44  am to provider [facility], who verbally acknowledged these results. Electronically Signed   By: PMacy MisM.D.   On: 08/31/2021 11:44   DG Chest Portable 1 View  Result Date: 08/31/2021 CLINICAL DATA:  71year old male with history of pain in the upper abdomen and some nausea. Evaluate for potential free air. EXAM: PORTABLE CHEST 1 VIEW COMPARISON:  Chest x-ray 08/04/2021. FINDINGS: Lung volumes are normal. Diffuse interstitial prominence and peribronchial cuffing, similar to the prior study. No consolidative airspace disease. No pleural effusions. No pneumothorax. No pulmonary nodule or mass noted. Pulmonary vasculature and the cardiomediastinal silhouette are within normal limits. Atherosclerosis in the thoracic aorta. Status post left shoulder arthroplasty. No definite pneumoperitoneum. IMPRESSION: 1. No definite pneumoperitoneum. 2. Diffuse interstitial prominence and peribronchial cuffing, similar to prior examinations, suggesting chronic bronchitis. 3. Aortic atherosclerosis. Electronically Signed   By: DVinnie LangtonM.D.   On: 08/31/2021 10:44    Procedures Dr. MDonnie Mesa(08/31/21) - Laparoscopic Cholecystectomy   Hospital Course:  Patient is a 71year old male who presented to the ED with abdominal pain.  Workup showed acute cholecystitis.  Patient was admitted and underwent procedure listed above.  Tolerated procedure well and was transferred to the floor.  Diet was advanced as tolerated.  On POD1, the patient was voiding well, tolerating diet, ambulating well, pain well controlled, vital signs stable, incisions c/d/i and felt stable for discharge home.  Patient will follow up in our office in 2 weeks and knows to call with questions or concerns.  He will call to confirm appointment date/time.    Physical Exam: General:  Alert, NAD, pleasant, comfortable Abd:  Soft, ND, mild tenderness, incisions C/D/I   I or a member of my team have reviewed this patient in the Controlled  Substance Database.   Allergies as of 09/01/2021       Reactions   Methotrexate Derivatives Nausea And Vomiting, Other (See Comments)  Hallucinations and aggressive behavior    Tramadol Nausea And Vomiting   Hallucinations and aggressive behavior         Medication List     TAKE these medications    acetaminophen 500 MG tablet Commonly known as: TYLENOL Take 2 tablets (1,000 mg total) by mouth every 6 (six) hours as needed for mild pain or fever.   albuterol 108 (90 Base) MCG/ACT inhaler Commonly known as: VENTOLIN HFA Inhale 2 puffs into the lungs every 6 (six) hours as needed for wheezing or shortness of breath.   albuterol (2.5 MG/3ML) 0.083% nebulizer solution Commonly known as: PROVENTIL Take 2.5 mg by nebulization every 6 (six) hours as needed for wheezing or shortness of breath.   atorvastatin 20 MG tablet Commonly known as: LIPITOR Take 1 tablet (20 mg total) by mouth every other day.   b complex vitamins tablet Take 1 tablet by mouth daily.   Breztri Aerosphere 160-9-4.8 MCG/ACT Aero Generic drug: Budeson-Glycopyrrol-Formoterol Inhale 1 puff into the lungs daily.   buPROPion 150 MG 24 hr tablet Commonly known as: Wellbutrin XL Take 1 tablet (150 mg total) by mouth daily.   cetirizine 10 MG tablet Commonly known as: ZYRTEC Take 10 mg by mouth daily as needed for allergies.   docusate sodium 100 MG capsule Commonly known as: COLACE Take 1 capsule (100 mg total) by mouth daily as needed for mild constipation.   esomeprazole 40 MG capsule Commonly known as: NEXIUM Take 1 capsule (40 mg total) by mouth daily. NO REFILLS. NEEDS TO TRANSITION CARE TO NEW PCP.   Fish Oil 1000 MG Caps Take 1,000 mg by mouth at bedtime.   fluticasone 50 MCG/ACT nasal spray Commonly known as: FLONASE Place 1 spray into both nostrils daily as needed for allergies.   furosemide 20 MG tablet Commonly known as: LASIX Take 1 tablet (20 mg total) by mouth 2 (two) times daily  as needed for edema. What changed: when to take this   Insulin Pen Needle 32G X 4 MM Misc For use with Lantus Solostar What changed: additional instructions   Lantus SoloStar 100 UNIT/ML Solostar Pen Generic drug: insulin glargine Inject 60 Units into the skin at bedtime.   lisinopril 2.5 MG tablet Commonly known as: ZESTRIL Take 1 tablet (2.5 mg total) by mouth at bedtime.   multivitamin with minerals Tabs tablet Take 1 tablet by mouth at bedtime.   oxyCODONE 5 MG immediate release tablet Commonly known as: Oxy IR/ROXICODONE Take 1 tablet (5 mg total) by mouth every 6 (six) hours as needed for moderate pain.   Prodigy No Coding Blood Gluc test strip Generic drug: glucose blood Use up to 4 times per day as directed with glucometer.   REFRESH OP Place 1 drop into both eyes daily as needed (dry eyes).   tacrolimus 0.1 % ointment Commonly known as: PROTOPIC Apply 1 application topically daily.   tadalafil 5 MG tablet Commonly known as: CIALIS Take 5 mg by mouth daily.   tamsulosin 0.4 MG Caps capsule Commonly known as: FLOMAX Take 0.4 mg by mouth daily.   Testosterone 30 MG/ACT Soln APPLY 1-2 PUMPS DAILY. ALTERNATE TAKING ONE PUMP UNDER ONE ARM ONE DAY, TWO PUMPS THE NEXT DAY (ONE PUMP UNDER EACH ARM) What changed: See the new instructions.   vitamin C 500 MG tablet Commonly known as: ASCORBIC ACID Take 500 mg by mouth daily.          Follow-up Information     [facility], [place]. Schedule  an appointment as soon as possible for a visit on 09/17/2021.   Specialty: General Surgery Why: 3:30 PM with Malachi Pro, PA-C. Please arrive 30 min prior to appointment time. Have ID and insurance information with you. Contact information: [place] [address] [place] [address] [phone]                 Signed: Norm Parcel , [facility] 09/01/2021, 9:19 AM Please see Amion for pager number during day hours  7:00am-4:30pm

## 2021-09-03 ENCOUNTER — Emergency Department (HOSPITAL_COMMUNITY): Payer: Medicare Other

## 2021-09-03 ENCOUNTER — Other Ambulatory Visit: Payer: Self-pay

## 2021-09-03 ENCOUNTER — Inpatient Hospital Stay (HOSPITAL_COMMUNITY)
Admission: EM | Admit: 2021-09-03 | Discharge: 2021-09-05 | DRG: 419 | Disposition: A | Discharge status: Home / Self Care | Payer: Medicare Other | Attending: Surgery | Admitting: Surgery

## 2021-09-03 ENCOUNTER — Encounter (HOSPITAL_COMMUNITY): Payer: Self-pay

## 2021-09-03 DIAGNOSIS — K5730 Diverticulosis of large intestine without perforation or abscess without bleeding: Secondary | ICD-10-CM | POA: Diagnosis present

## 2021-09-03 DIAGNOSIS — G4733 Obstructive sleep apnea (adult) (pediatric): Secondary | ICD-10-CM | POA: Diagnosis present

## 2021-09-03 DIAGNOSIS — Z885 Allergy status to narcotic agent status: Secondary | ICD-10-CM

## 2021-09-03 DIAGNOSIS — R1011 Right upper quadrant pain: Secondary | ICD-10-CM | POA: Diagnosis present

## 2021-09-03 DIAGNOSIS — J449 Chronic obstructive pulmonary disease, unspecified: Secondary | ICD-10-CM | POA: Diagnosis present

## 2021-09-03 DIAGNOSIS — K760 Fatty (change of) liver, not elsewhere classified: Secondary | ICD-10-CM | POA: Diagnosis present

## 2021-09-03 DIAGNOSIS — K429 Umbilical hernia without obstruction or gangrene: Secondary | ICD-10-CM | POA: Diagnosis present

## 2021-09-03 DIAGNOSIS — I10 Essential (primary) hypertension: Secondary | ICD-10-CM | POA: Diagnosis present

## 2021-09-03 DIAGNOSIS — I700 Atherosclerosis of aorta: Secondary | ICD-10-CM | POA: Diagnosis present

## 2021-09-03 DIAGNOSIS — N400 Enlarged prostate without lower urinary tract symptoms: Secondary | ICD-10-CM | POA: Diagnosis present

## 2021-09-03 DIAGNOSIS — R1084 Generalized abdominal pain: Secondary | ICD-10-CM | POA: Diagnosis present

## 2021-09-03 DIAGNOSIS — R17 Unspecified jaundice: Secondary | ICD-10-CM | POA: Diagnosis not present

## 2021-09-03 DIAGNOSIS — F17210 Nicotine dependence, cigarettes, uncomplicated: Secondary | ICD-10-CM | POA: Diagnosis present

## 2021-09-03 DIAGNOSIS — E1169 Type 2 diabetes mellitus with other specified complication: Secondary | ICD-10-CM | POA: Diagnosis present

## 2021-09-03 DIAGNOSIS — E1165 Type 2 diabetes mellitus with hyperglycemia: Secondary | ICD-10-CM | POA: Diagnosis present

## 2021-09-03 DIAGNOSIS — K8000 Calculus of gallbladder with acute cholecystitis without obstruction: Principal | ICD-10-CM | POA: Diagnosis present

## 2021-09-03 DIAGNOSIS — K219 Gastro-esophageal reflux disease without esophagitis: Secondary | ICD-10-CM | POA: Diagnosis present

## 2021-09-03 DIAGNOSIS — M797 Fibromyalgia: Secondary | ICD-10-CM | POA: Diagnosis present

## 2021-09-03 DIAGNOSIS — Z7951 Long term (current) use of inhaled steroids: Secondary | ICD-10-CM | POA: Diagnosis not present

## 2021-09-03 DIAGNOSIS — Z888 Allergy status to other drugs, medicaments and biological substances status: Secondary | ICD-10-CM

## 2021-09-03 DIAGNOSIS — R748 Abnormal levels of other serum enzymes: Secondary | ICD-10-CM | POA: Diagnosis not present

## 2021-09-03 DIAGNOSIS — R7401 Elevation of levels of liver transaminase levels: Secondary | ICD-10-CM | POA: Diagnosis present

## 2021-09-03 DIAGNOSIS — J42 Unspecified chronic bronchitis: Secondary | ICD-10-CM | POA: Diagnosis present

## 2021-09-03 DIAGNOSIS — M62838 Other muscle spasm: Secondary | ICD-10-CM | POA: Diagnosis not present

## 2021-09-03 DIAGNOSIS — K829 Disease of gallbladder, unspecified: Secondary | ICD-10-CM | POA: Diagnosis not present

## 2021-09-03 DIAGNOSIS — Z85820 Personal history of malignant melanoma of skin: Secondary | ICD-10-CM

## 2021-09-03 DIAGNOSIS — Z9049 Acquired absence of other specified parts of digestive tract: Secondary | ICD-10-CM

## 2021-09-03 DIAGNOSIS — Z96612 Presence of left artificial shoulder joint: Secondary | ICD-10-CM | POA: Diagnosis present

## 2021-09-03 DIAGNOSIS — M1990 Unspecified osteoarthritis, unspecified site: Secondary | ICD-10-CM | POA: Diagnosis present

## 2021-09-03 DIAGNOSIS — K869 Disease of pancreas, unspecified: Secondary | ICD-10-CM | POA: Diagnosis not present

## 2021-09-03 DIAGNOSIS — K831 Obstruction of bile duct: Secondary | ICD-10-CM | POA: Diagnosis present

## 2021-09-03 DIAGNOSIS — K5900 Constipation, unspecified: Secondary | ICD-10-CM | POA: Diagnosis not present

## 2021-09-03 DIAGNOSIS — E785 Hyperlipidemia, unspecified: Secondary | ICD-10-CM | POA: Diagnosis present

## 2021-09-03 DIAGNOSIS — Z79890 Hormone replacement therapy (postmenopausal): Secondary | ICD-10-CM

## 2021-09-03 DIAGNOSIS — Z794 Long term (current) use of insulin: Secondary | ICD-10-CM

## 2021-09-03 DIAGNOSIS — N281 Cyst of kidney, acquired: Secondary | ICD-10-CM | POA: Diagnosis not present

## 2021-09-03 DIAGNOSIS — Z79899 Other long term (current) drug therapy: Secondary | ICD-10-CM

## 2021-09-03 DIAGNOSIS — Z833 Family history of diabetes mellitus: Secondary | ICD-10-CM

## 2021-09-03 HISTORY — DX: Elevation of levels of liver transaminase levels: R74.01

## 2021-09-03 HISTORY — DX: Acquired absence of other specified parts of digestive tract: Z90.49

## 2021-09-03 HISTORY — DX: Abnormal levels of other serum enzymes: R74.8

## 2021-09-03 HISTORY — DX: Right upper quadrant pain: R10.11

## 2021-09-03 LAB — CBC
HCT: 41.1 % (ref 39.0–52.0)
Hemoglobin: 14.1 g/dL (ref 13.0–17.0)
MCH: 31.8 pg (ref 26.0–34.0)
MCHC: 34.3 g/dL (ref 30.0–36.0)
MCV: 92.6 fL (ref 80.0–100.0)
Platelets: 253 10*3/uL (ref 150–400)
RBC: 4.44 MIL/uL (ref 4.22–5.81)
RDW: 13.2 % (ref 11.5–15.5)
WBC: 9.8 10*3/uL (ref 4.0–10.5)
nRBC: 0 % (ref 0.0–0.2)

## 2021-09-03 LAB — COMPREHENSIVE METABOLIC PANEL
ALT: 415 U/L — ABNORMAL HIGH (ref 0–44)
AST: 359 U/L — ABNORMAL HIGH (ref 15–41)
Albumin: 3.1 g/dL — ABNORMAL LOW (ref 3.5–5.0)
Alkaline Phosphatase: 367 U/L — ABNORMAL HIGH (ref 38–126)
Anion gap: 11 (ref 5–15)
BUN: 11 mg/dL (ref 8–23)
CO2: 27 mmol/L (ref 22–32)
Calcium: 8.7 mg/dL — ABNORMAL LOW (ref 8.9–10.3)
Chloride: 99 mmol/L (ref 98–111)
Creatinine, Ser: 1.1 mg/dL (ref 0.61–1.24)
GFR, Estimated: >60 mL/min (ref >60)
Glucose, Bld: 164 mg/dL — ABNORMAL HIGH (ref 70–99)
Potassium: 3.9 mmol/L (ref 3.5–5.1)
Sodium: 137 mmol/L (ref 135–145)
Total Bilirubin: 5.6 mg/dL — ABNORMAL HIGH (ref 0.3–1.2)
Total Protein: 7.2 g/dL (ref 6.5–8.1)

## 2021-09-03 LAB — LIPASE, BLOOD: Lipase: 17 U/L (ref 11–51)

## 2021-09-03 IMAGING — CT CT ABD-PELV W/ CM
2 of 5 series · 13 of 46 positions shown, 15 images · IV contrast (agent unspecified)
Comparison: CT abdomen pelvis dated [DATE].
COMPARISON: CT abdomen pelvis dated [DATE].

Addendum:
CLINICAL DATA: Abdominal pain. Recent cholecystectomy and hernia
repair.

EXAM:
CT ABDOMEN AND PELVIS WITH CONTRAST
TECHNIQUE: Multidetector CT imaging of the abdomen and pelvis was performed
using the standard protocol following bolus administration of
intravenous contrast.

[Series 2: axial st · axial · 0.85mm/px · z∈[-534,-79]mm · 10 of 105 slices shown, 12 images]
[im 7/105  soft-tissue]
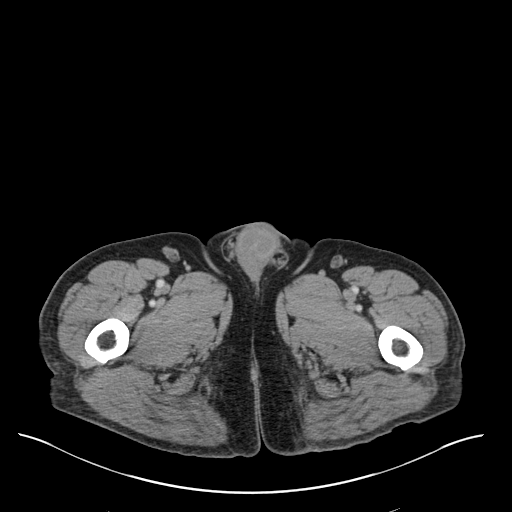
[im 7/105  bone]
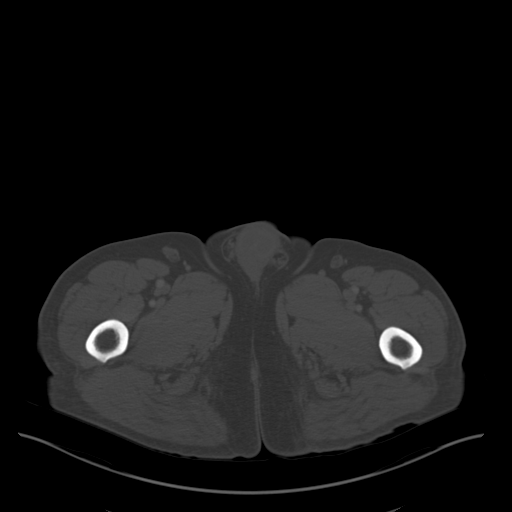
[im 20/105  soft-tissue]
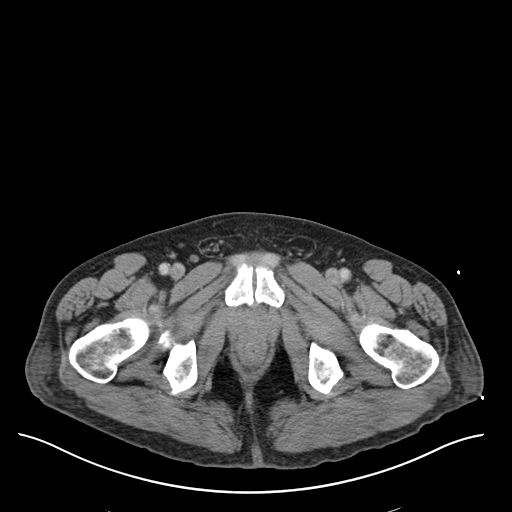
[im 27/105  soft-tissue]
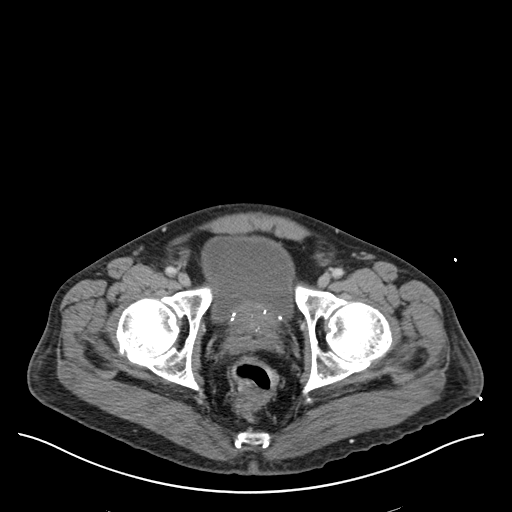
[im 40/105  soft-tissue]
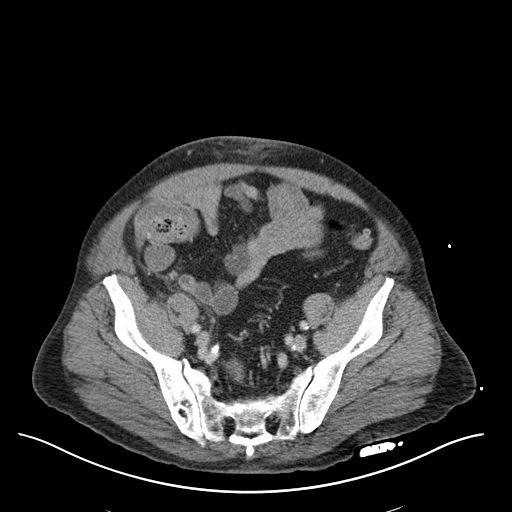
[im 46/105  soft-tissue]
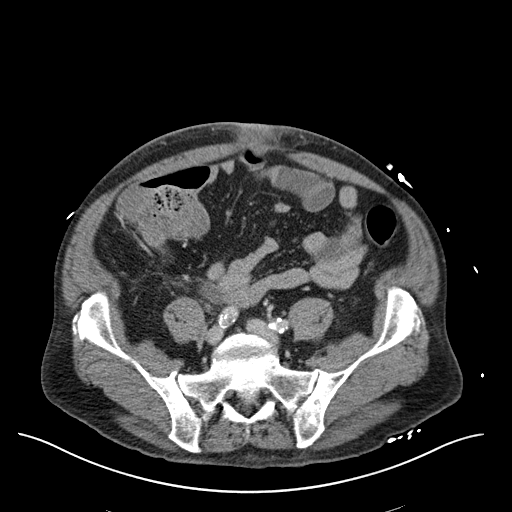
[im 59/105  soft-tissue]
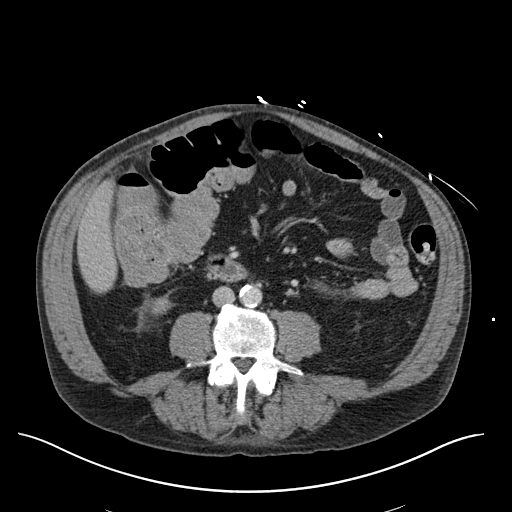
[im 66/105  soft-tissue]
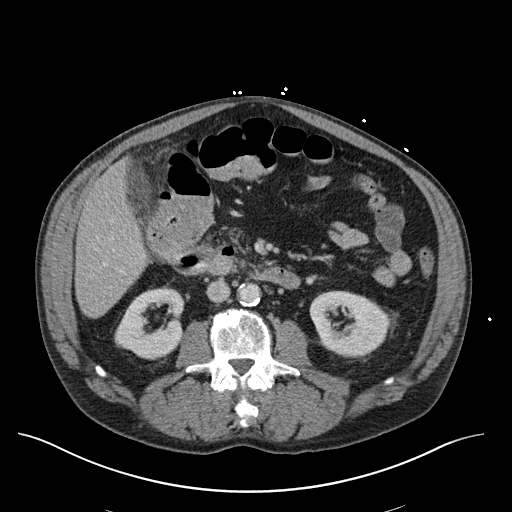
[im 79/105  soft-tissue]
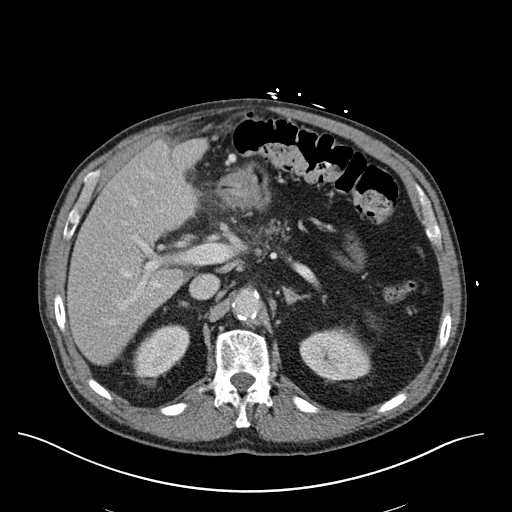
[im 85/105  soft-tissue]
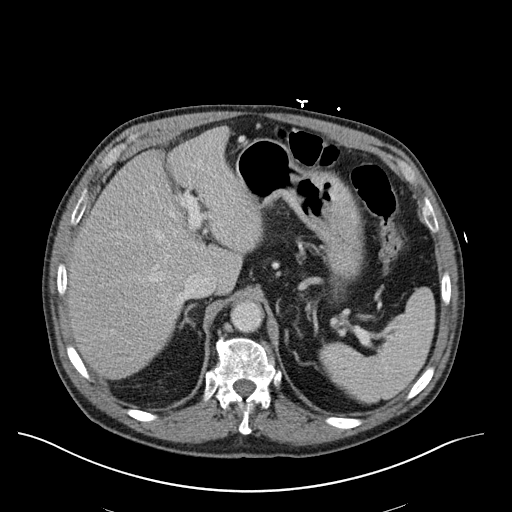
[im 85/105  bone]
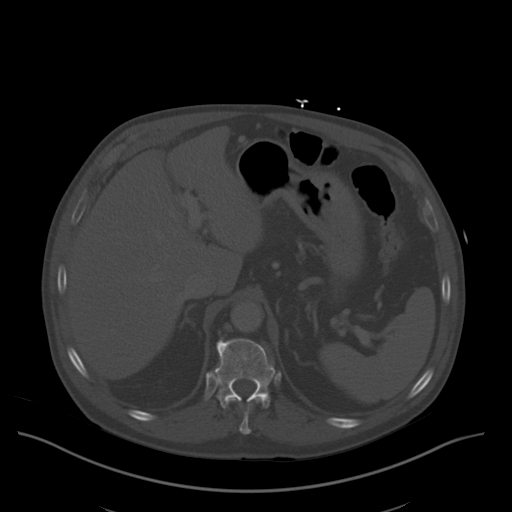
[im 98/105  soft-tissue]
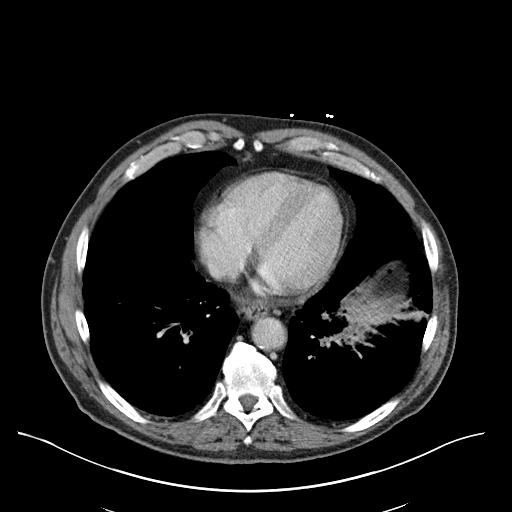

[Series 5: coronal st · coronal · 0.78mm/px · 3 of 174 slices shown]
[im 58/174  soft-tissue]
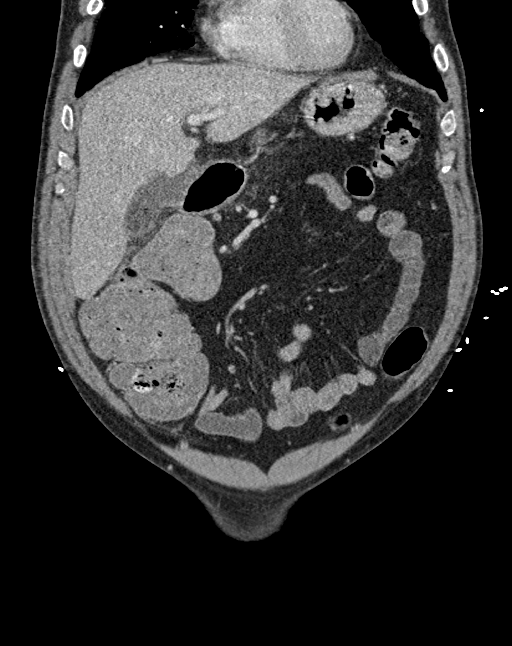
[im 77/174  soft-tissue]
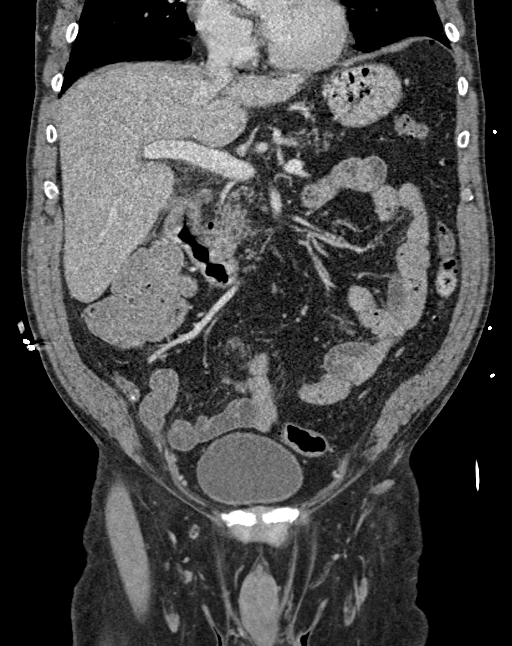
[im 97/174  soft-tissue]
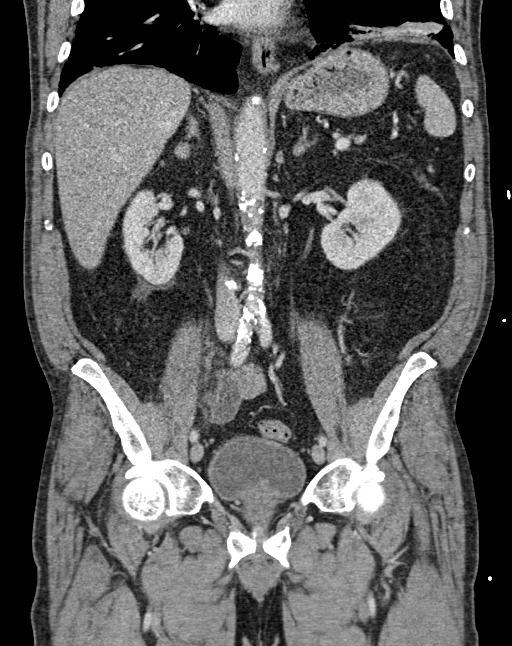

[13 of 46 positions shown; findings below may reference images not displayed]

RADIATION DOSE REDUCTION: This exam was performed according to the
departmental dose-optimization program which includes automated
exposure control, adjustment of the mA and/or kV according to
patient size and/or use of iterative reconstruction technique.

CONTRAST:  100mL OMNIPAQUE IOHEXOL 300 MG/ML  SOLN
FINDINGS: Lower chest: Mild eventration of the left diaphragm. There is
bibasilar atelectasis or scarring. Background of emphysema.

Small pockets of pneumoperitoneum in the upper abdomen, likely
postoperative. No large free air or free fluid.

Hepatobiliary: Mild fatty liver. No intrahepatic biliary dilatation.
Cholecystectomy. Inflammatory changes with small pockets of air at
the cholecystectomy bed, likely postoperative. No drainable fluid
collection or abscess.

Pancreas: There is fatty infiltration of the pancreas. No active
inflammatory changes. No dilatation of the main pancreatic duct.

Spleen: Normal in size without focal abnormality.

Adrenals/Urinary Tract: The right adrenal glands unremarkable. There
is mild nodularity of the left adrenal gland. Small right renal
interpolar cyst. There is no hydronephrosis on either side. There is
symmetric enhancement and excretion of contrast by both kidneys. The
visualized ureters and the urinary bladder appear unremarkable.

Stomach/Bowel: There is sigmoid diverticulosis without active
inflammatory changes. Several duodenal diverticula noted. There is
no bowel obstruction or active inflammation. The appendix is normal.

Vascular/Lymphatic: Moderate aortoiliac atherosclerotic disease. The
IVC is unremarkable. No portal venous gas. There is no adenopathy.

Reproductive: Brachytherapy seeds noted the prostate gland. The
seminal vesicles are symmetric.

Other: There is infiltration of the umbilical fat and skin and
subcutaneous soft tissues of the periumbilical region, likely
related to postoperative hernia repair. No drainable fluid
collection or abscess. No hernia.

Musculoskeletal: Osteopenia with degenerative changes of the spine.
No acute osseous pathology.
IMPRESSION: 1. Status post cholecystectomy with inflammatory changes at the
cholecystectomy bed, likely postoperative. No drainable fluid
collection or abscess.
2. Postoperative umbilical hernia repair.  No fluid collection.
3. Sigmoid diverticulosis. No bowel obstruction. Normal appendix.
4. Aortic Atherosclerosis ([AS]-[AS]) and Emphysema ([AS]-[AS]).

ADDENDUM:
Please note the described inflammatory process at the
cholecystectomy bed may be postsurgical. However, developing abscess
or the possibility of by leak is not entirely excluded. If there is
concern for biliary leak further evaluation with a hepatobiliary
scintigraphy is recommended. No drainable fluid collection/abscess
at this time.

*** End of Addendum ***
RADIATION DOSE REDUCTION: This exam was performed according to the
departmental dose-optimization program which includes automated
exposure control, adjustment of the mA and/or kV according to
patient size and/or use of iterative reconstruction technique.

CONTRAST:  100mL OMNIPAQUE IOHEXOL 300 MG/ML  SOLN
FINDINGS: Lower chest: Mild eventration of the left diaphragm. There is
bibasilar atelectasis or scarring. Background of emphysema.

Small pockets of pneumoperitoneum in the upper abdomen, likely
postoperative. No large free air or free fluid.

Hepatobiliary: Mild fatty liver. No intrahepatic biliary dilatation.
Cholecystectomy. Inflammatory changes with small pockets of air at
the cholecystectomy bed, likely postoperative. No drainable fluid
collection or abscess.

Pancreas: There is fatty infiltration of the pancreas. No active
inflammatory changes. No dilatation of the main pancreatic duct.

Spleen: Normal in size without focal abnormality.

Adrenals/Urinary Tract: The right adrenal glands unremarkable. There
is mild nodularity of the left adrenal gland. Small right renal
interpolar cyst. There is no hydronephrosis on either side. There is
symmetric enhancement and excretion of contrast by both kidneys. The
visualized ureters and the urinary bladder appear unremarkable.

Stomach/Bowel: There is sigmoid diverticulosis without active
inflammatory changes. Several duodenal diverticula noted. There is
no bowel obstruction or active inflammation. The appendix is normal.

Vascular/Lymphatic: Moderate aortoiliac atherosclerotic disease. The
IVC is unremarkable. No portal venous gas. There is no adenopathy.

Reproductive: Brachytherapy seeds noted the prostate gland. The
seminal vesicles are symmetric.

Other: There is infiltration of the umbilical fat and skin and
subcutaneous soft tissues of the periumbilical region, likely
related to postoperative hernia repair. No drainable fluid
collection or abscess. No hernia.

Musculoskeletal: Osteopenia with degenerative changes of the spine.
No acute osseous pathology.
IMPRESSION: 1. Status post cholecystectomy with inflammatory changes at the
cholecystectomy bed, likely postoperative. No drainable fluid
collection or abscess.
2. Postoperative umbilical hernia repair.  No fluid collection.
3. Sigmoid diverticulosis. No bowel obstruction. Normal appendix.
4. Aortic Atherosclerosis ([AS]-[AS]) and Emphysema ([AS]-[AS]).

## 2021-09-03 MED ORDER — FENTANYL CITRATE PF 50 MCG/ML IJ SOSY
50.0000 ug | PREFILLED_SYRINGE | Freq: Once | INTRAMUSCULAR | Status: AC
  Administered 2021-09-03: 50 ug via INTRAVENOUS
  Filled 2021-09-03: qty 1

## 2021-09-03 MED ORDER — IOHEXOL 300 MG/ML  SOLN
100.0000 mL | Freq: Once | INTRAMUSCULAR | Status: AC | PRN
  Administered 2021-09-03: 100 mL via INTRAVENOUS

## 2021-09-03 NOTE — ED Provider Notes (Signed)
[facility] DEPT Provider Note   CSN: [id number] Arrival date & time: 09/03/21  1932     History  Chief Complaint  Patient presents with   Abdominal Pain    Frederick Guzman is a 71 y.o. male.  Patient with history of COPD, diabetes, hypertension, hyperlipidemia presents today with complaints of abdominal pain. He states that same has been worsening over the past few days. States that he had a cholecystectomy and hernia repair on 7/20 without complication.  He states that since then he has been having worsening pain.  He also states that he has not had a bowel movement since Saturday and has been passing minimal flatus as well.  He states that his pain is severe and constant.  He denies any nausea or vomiting.  Denies any fevers or chills.  States that his pain is throughout his abdomen but is worse in his right upper quadrant.  The history is provided by the patient. No language interpreter was used.  Abdominal Pain      Home Medications Prior to Admission medications   Medication Sig Start Date End Date Taking? Authorizing Provider  acetaminophen (TYLENOL) 500 MG tablet Take 2 tablets (1,000 mg total) by mouth every 6 (six) hours as needed for mild pain or fever. 09/01/21  Yes Barkley Boards R, PA-C  albuterol (PROVENTIL) (2.5 MG/3ML) 0.083% nebulizer solution Take 2.5 mg by nebulization every 6 (six) hours as needed for wheezing or shortness of breath.   Yes [provider]  albuterol (VENTOLIN HFA) 108 (90 Base) MCG/ACT inhaler Inhale 2 puffs into the lungs every 6 (six) hours as needed for wheezing or shortness of breath.   Yes [provider]  atorvastatin (LIPITOR) 20 MG tablet Take 1 tablet (20 mg total) by mouth every other day. 02/17/21  Yes Hali Marry, MD  b complex vitamins tablet Take 1 tablet by mouth daily.   Yes [provider]  Budeson-Glycopyrrol-Formoterol (BREZTRI AEROSPHERE) 160-9-4.8 MCG/ACT  AERO Inhale 1 puff into the lungs daily. 11/14/19  Yes [provider]  buPROPion (WELLBUTRIN XL) 150 MG 24 hr tablet Take 1 tablet (150 mg total) by mouth daily. 08/04/21  Yes Luetta Nutting, DO  cetirizine (ZYRTEC) 10 MG tablet Take 10 mg by mouth daily as needed for allergies.    Yes [provider]  docusate sodium (COLACE) 100 MG capsule Take 1 capsule (100 mg total) by mouth daily as needed for mild constipation. 09/01/21  Yes Norm Parcel, PA-C  esomeprazole (NEXIUM) 40 MG capsule Take 1 capsule (40 mg total) by mouth daily. NO REFILLS. NEEDS TO TRANSITION CARE TO NEW PCP. Patient taking differently: Take 40 mg by mouth daily. 06/15/21  Yes Luetta Nutting, DO  fluticasone (FLONASE) 50 MCG/ACT nasal spray Place 1 spray into both nostrils daily as needed for allergies. 12/30/15  Yes [provider]  furosemide (LASIX) 20 MG tablet Take 1 tablet (20 mg total) by mouth 2 (two) times daily as needed for edema. Patient taking differently: Take 20 mg by mouth daily. 07/24/21  Yes Luetta Nutting, DO  LANTUS SOLOSTAR 100 UNIT/ML Solostar Pen Inject 60 Units into the skin at bedtime. 08/03/21  Yes [provider]  lisinopril (ZESTRIL) 2.5 MG tablet Take 1 tablet (2.5 mg total) by mouth at bedtime. 08/20/21  Yes Breeback, Jade L, PA-C  Multiple Vitamin (MULTIVITAMIN WITH MINERALS) TABS tablet Take 1 tablet by mouth at bedtime.   Yes [provider]  Omega-3 Fatty Acids (  FISH OIL) 1000 MG CAPS Take 1,000 mg by mouth at bedtime.    Yes [provider]  oxyCODONE (OXY IR/ROXICODONE) 5 MG immediate release tablet Take 1 tablet (5 mg total) by mouth every 6 (six) hours as needed for moderate pain. 09/01/21  Yes Norm Parcel, PA-C  Polyvinyl Alcohol-Povidone (REFRESH OP) Place 1 drop into both eyes daily as needed (dry eyes).   Yes [provider]  tacrolimus (PROTOPIC) 0.1 % ointment Apply 1 application topically daily. 02/05/19  Yes [provider]  tadalafil (CIALIS) 5 MG tablet Take 5 mg by mouth daily. 12/18/20  Yes [provider]  tamsulosin (FLOMAX) 0.4 MG CAPS capsule Take 0.4 mg by mouth at bedtime.   Yes [provider]  Testosterone 30 MG/ACT SOLN APPLY 1-2 PUMPS DAILY. ALTERNATE TAKING ONE PUMP UNDER ONE ARM ONE DAY, TWO PUMPS THE NEXT DAY (ONE PUMP UNDER EACH ARM) Patient taking differently: Apply 1-2 Pump topically 3 (three) times a week. Apply 1 pump under ONE arm for 1 day- ALTERNATING - 1 pump under each arm for 1 day 04/14/21  Yes Luetta Nutting, DO  vitamin C (ASCORBIC ACID) 500 MG tablet Take 500 mg by mouth daily.   Yes [provider]  glucose blood (PRODIGY NO CODING BLOOD GLUC) test strip Use up to 4 times per day as directed with glucometer. 09/17/20   [name], DO  Insulin Pen Needle 32G X 4 MM MISC For use with Lantus Solostar Patient taking differently: For use with Lantus Solostar. 60 units of insulin 09/15/20   Trixie Dredge, PA-C      Allergies    Methotrexate derivatives and Tramadol    Review of Systems   Review of Systems  Gastrointestinal:  Positive for abdominal pain.  All other systems reviewed and are negative.   Physical Exam Updated Vital Signs BP (!) 150/75   Pulse (!) 106   Temp 99.2 F (37.3 C) (Oral)   Resp 19   Ht 6' (1.829 m)   Wt 90.7 kg   SpO2 91%   BMI 27.12 kg/m  Physical Exam Vitals and nursing note reviewed.  Constitutional:      General: He is not in acute distress.    Appearance: Normal appearance. He is well-developed and normal weight. He is not ill-appearing, toxic-appearing or diaphoretic.  HENT:     Head: Normocephalic and atraumatic.  Cardiovascular:     Rate and Rhythm: Normal rate.  Pulmonary:     Effort: Pulmonary effort is normal. No respiratory distress.  Abdominal:     Tenderness: There is generalized abdominal tenderness and tenderness in the right upper quadrant.     Comments: Well-healing  surgical scars present  Musculoskeletal:        General: Normal range of motion.     Cervical back: Normal range of motion.  Skin:    General: Skin is warm and dry.  Neurological:     General: No focal deficit present.     Mental Status: He is alert.  Psychiatric:        Mood and Affect: Mood normal.        Behavior: Behavior normal.     ED Results / Procedures / Treatments   Labs (all labs ordered are listed, but only abnormal results are displayed) Labs Reviewed  COMPREHENSIVE METABOLIC PANEL - Abnormal; Notable for the following components:      Result Value   Glucose, Bld 164 (*)    Calcium 8.7 (*)  Albumin 3.1 (*)    AST 359 (*)    ALT 415 (*)    Alkaline Phosphatase 367 (*)    Total Bilirubin 5.6 (*)    All other components within normal limits  LIPASE, BLOOD  CBC  URINALYSIS, ROUTINE W REFLEX MICROSCOPIC    EKG None  Radiology CT ABDOMEN PELVIS W CONTRAST  Addendum Date: 09/03/2021   ADDENDUM REPORT: 09/03/2021 22:42 ADDENDUM: Please note the described inflammatory process at the cholecystectomy bed may be postsurgical. However, developing abscess or the possibility of by leak is not entirely excluded. If there is concern for biliary leak further evaluation with a hepatobiliary scintigraphy is recommended. No drainable fluid collection/abscess at this time. Electronically Signed   By: Anner Crete M.D.   On: 09/03/2021 22:42   Result Date: 09/03/2021 CLINICAL DATA:  Abdominal pain. Recent cholecystectomy and hernia repair. EXAM: CT ABDOMEN AND PELVIS WITH CONTRAST TECHNIQUE: Multidetector CT imaging of the abdomen and pelvis was performed using the standard protocol following bolus administration of intravenous contrast. RADIATION DOSE REDUCTION: This exam was performed according to the departmental dose-optimization program which includes automated exposure control, adjustment of the mA and/or kV according to patient size and/or use of iterative reconstruction  technique. CONTRAST:  117m OMNIPAQUE IOHEXOL 300 MG/ML  SOLN COMPARISON:  CT abdomen pelvis dated 08/31/2021. FINDINGS: Lower chest: Mild eventration of the left diaphragm. There is bibasilar atelectasis or scarring. Background of emphysema. Small pockets of pneumoperitoneum in the upper abdomen, likely postoperative. No large free air or free fluid. Hepatobiliary: Mild fatty liver. No intrahepatic biliary dilatation. Cholecystectomy. Inflammatory changes with small pockets of air at the cholecystectomy bed, likely postoperative. No drainable fluid collection or abscess. Pancreas: There is fatty infiltration of the pancreas. No active inflammatory changes. No dilatation of the main pancreatic duct. Spleen: Normal in size without focal abnormality. Adrenals/Urinary Tract: The right adrenal glands unremarkable. There is mild nodularity of the left adrenal gland. Small right renal interpolar cyst. There is no hydronephrosis on either side. There is symmetric enhancement and excretion of contrast by both kidneys. The visualized ureters and the urinary bladder appear unremarkable. Stomach/Bowel: There is sigmoid diverticulosis without active inflammatory changes. Several duodenal diverticula noted. There is no bowel obstruction or active inflammation. The appendix is normal. Vascular/Lymphatic: Moderate aortoiliac atherosclerotic disease. The IVC is unremarkable. No portal venous gas. There is no adenopathy. Reproductive: Brachytherapy seeds noted the prostate gland. The seminal vesicles are symmetric. Other: There is infiltration of the umbilical fat and skin and subcutaneous soft tissues of the periumbilical region, likely related to postoperative hernia repair. No drainable fluid collection or abscess. No hernia. Musculoskeletal: Osteopenia with degenerative changes of the spine. No acute osseous pathology. IMPRESSION: 1. Status post cholecystectomy with inflammatory changes at the cholecystectomy bed, likely  postoperative. No drainable fluid collection or abscess. 2. Postoperative umbilical hernia repair.  No fluid collection. 3. Sigmoid diverticulosis. No bowel obstruction. Normal appendix. 4. Aortic Atherosclerosis (ICD10-I70.0) and Emphysema (ICD10-J43.9). Electronically Signed: By: AAnner CreteM.D. On: 09/03/2021 22:15    Procedures Procedures    Medications Ordered in ED Medications  fentaNYL (SUBLIMAZE) injection 50 mcg (50 mcg Intravenous Given 09/03/21 2019)  iohexol (OMNIPAQUE) 300 MG/ML solution 100 mL (100 mLs Intravenous Contrast Given 09/03/21 2145)    ED Course/ Medical Decision Making/ A&P                           Medical Decision Making Amount and/or Complexity of  Data Reviewed Labs: ordered. Radiology: ordered.  Risk Prescription drug management.   This patient presents to the ED for concern of abdominal pain, constipation, this involves an extensive number of treatment options, and is a complaint that carries with it a high risk of complications and morbidity.  Co morbidities that complicate the patient evaluation  Hx hypertension, diabetes, COPD. Had cholecystectomy and hernia repair on 6/12   Additional history obtained:  Additional history obtained from ER admission and op note    Lab Tests:  I Ordered, and personally interpreted labs.  The pertinent results include: No leukocytosis or anemia. AST 359, ALT 415, Alk phos 367, T bili 5.6   Imaging Studies ordered:  I ordered imaging studies including CT abdomen pelvis with contrast  I independently visualized and interpreted imaging which showed  1. Status post cholecystectomy with inflammatory changes at the cholecystectomy bed, likely postoperative. No drainable fluid collection or abscess. Please note the described inflammatory process at the cholecystectomy bed may be postsurgical. However, developing abscess or the possibility of by leak is not entirely excluded. If there is concern for biliary  leak further evaluation with a hepatobiliary scintigraphy is recommended. No drainable fluid collection/abscess at this time. 2. Postoperative umbilical hernia repair.  No fluid collection. 3. Sigmoid diverticulosis. No bowel obstruction. Normal appendix. I agree with the radiologist interpretation    Consultations Obtained:  I requested consultation with the general surgery on call Dr. Harlow Asa,  and discussed lab and imaging findings as well as pertinent plan - they recommend: admit to medicine with surgery following. They will see the patient tonight   Problem List / ED Course / Critical interventions / Medication management  I ordered medication including Fentanyl  for pain  Reevaluation of the patient after these medicines showed that the patient improved I have reviewed the patients home medicines and have made adjustments as needed   Test / Admission - Considered:  Patient presents today with worsening abdominal pain after cholecystectomy and hernia repair 3 days ago.  CT scan showed no clear abscess or obstruction, however patient does have transaminitis with a T. bili of 5 on laboratory evaluation. Consulted general surgery who will see the patient. He will likely require MRCP/ERCP for further evaluation. Given comorbid conditions will admit to medicine.  Discussed with patient who is amenable with plan.  Discussed patient with hospitalist who agrees to admit.  This is a shared visit with supervising physician Dr. Yann Fischer who has independently evaluated patient & provided guidance in evaluation/management/disposition, in agreement with care    Final Clinical Impression(s) / ED Diagnoses Final diagnoses:  Generalized abdominal pain  Transaminitis    Rx / DC Orders ED Discharge Orders     None         Nestor Lewandowsky 09/03/21 2353    Gareth Morgan, MD 09/04/21 1024

## 2021-09-03 NOTE — H&P (Signed)
Frederick Guzman is an 71 y.o. male.   Chief Complaint: abdominal pain  HPI: Patient is a 71 yo male 3 days status post lap cholecystectomy by Dr. Georgette Dover for acute cholecystitis.  Cholangiogram was attempted but could not be successfully completed during the procedure.  Patient was discharged on POD#1.  Now with progressive abdominal pain and distension.  Unable to have a BM.  Presents to ER for evaluation.  WBC normal.  Low grade fever.  Liver enzymes are elevated.  CT with post op changes in the gallbladder bed but no definite leak or abscess.  General surgery called to evaluate and manage.  Past Medical History:  Diagnosis Date   Allergy    Arthritis    OA   Asthma    Cancer ([place]) 08/2014   melanoma on R shoulder & back - [facility]    COPD (chronic obstructive pulmonary disease) ([place])    Depression    pt. denies- 01/2016, pt on Wellbutrin for smoking cessation   Diabetes mellitus without complication ([place])    Diverticulitis    Dyspnea    with exertion   Fibromyalgia    GERD (gastroesophageal reflux disease)    Hyperlipemia    Hypertension    "never was high"   Lymphocytic colitis 04/2018   Pneumonia 06/2015, 10/2016   Sleep apnea    last study - Nov. 2017, waiting for a new machine, using the old CPAP q night after cleaning the device after he was documented to have pneumonia from his CPAP machine.     Past Surgical History:  Procedure Laterality Date   carpel tunnel Bilateral    CHOLECYSTECTOMY N/A 08/31/2021   Procedure: LAPAROSCOPIC CHOLECYSTECTOMY AND UMBILICAL HERNIA REPAIR;  Surgeon: Donnie Mesa, MD;  [facility]: [facility];  Service: General;  Laterality: N/A;   COLONOSCOPY W/ POLYPECTOMY     COLONOSCOPY W/ POLYPECTOMY     EXCISIONAL TOTAL SHOULDER ARTHROPLASTY WITH ANTIBIOTIC SPACER Left 08/09/2016   Procedure: LEFT SHOULDER HARDWARE REMOVAL, IRRIGATION AND DEBRIDEMENT, PLACEMENT OF ANTIBIOTIC SPACER;  Surgeon: Netta Cedars, MD;  Location: [place];  Service:  Orthopedics;  Laterality: Left;   IRRIGATION AND DEBRIDEMENT SHOULDER Left 03/31/2016   Procedure: IRRIGATION AND DEBRIDEMENT SHOULDER, deep cultures and polyethylene exchange;  Surgeon: Netta Cedars, MD;  Location: [place];  Service: Orthopedics;  Laterality: Left;  requests 31mns   REVERSE SHOULDER ARTHROPLASTY Left 01/30/2016   Procedure: LEFT REVERSE SHOULDER ARTHROPLASTY;  Surgeon: SNetta Cedars MD;  [place] [facility]  Service: Orthopedics;  Laterality: Left;   ROTATOR CUFF REPAIR Left    SHOULDER ARTHROSCOPY WITH DISTAL CLAVICLE RESECTION Right 01/30/2018   Procedure: RIGHT SHOULDER ARTHROSCOPY WITH OPEN DISTAL CLAVICLE RESECTION, SUBACROMIAL DECOMPRESSION;  Surgeon: NNetta Cedars MD;  [place] [facility]  Service: Orthopedics;  Laterality: Right;   STERIOD INJECTION Right 01/30/2018   Procedure: STEROID INJECTION;  Surgeon: NNetta Cedars MD;  [place] [facility]  Service: Orthopedics;  Laterality: Right;   TOTAL SHOULDER REVISION Left 01/17/2017   Procedure: LEFT SHOULDER REVISION REVERSE TOTAL SHOULDER ARTHROPLASTY;  Surgeon: NNetta Cedars MD;  [place] [facility]  Service: Orthopedics;  Laterality: Left;   UMBILICAL HERNIA REPAIR N/A 08/31/2021   Procedure: HERNIA REPAIR UMBILICAL ADULT possible;  Surgeon: TDonnie Mesa MD;  [facility];  Service: General;  Laterality: N/A;   WRIST SURGERY Right 11/2014   Dr. GAmedeo Plenty   Family History  Problem Relation Age of Onset   Diabetes Mother    Colon cancer Father  Prostate cancer Father    Diabetes Father    Esophageal cancer Neg Hx    Rectal cancer Neg Hx    Stomach cancer Neg Hx    Social History:  reports that he has been smoking cigarettes. He has a 32.25 pack-year smoking history. He has never used smokeless tobacco. He reports current alcohol use of about 9.0 standard drinks of alcohol per week. He reports that he does not use drugs.  Allergies:  Allergies  Allergen Reactions   Methotrexate Derivatives Nausea And Vomiting and  Other (See Comments)    Hallucinations and aggressive behavior     Tramadol Nausea And Vomiting    Hallucinations and aggressive behavior     (Not in a hospital admission)   Results for orders placed or performed during the hospital encounter of 09/03/21 (from the past 48 hour(s))  Lipase, blood     Status: None   Collection Time: 09/03/21  8:15 PM  Result Value Ref Range   Lipase 17 11 - 51 U/L    Comment: Performed at [facility], [place] [address], [place], [place] [address]  Comprehensive metabolic panel     Status: Abnormal   Collection Time: 09/03/21  8:15 PM  Result Value Ref Range   Sodium 137 135 - 145 mmol/L   Potassium 3.9 3.5 - 5.1 mmol/L   Chloride 99 98 - 111 mmol/L   CO2 27 22 - 32 mmol/L   Glucose, Bld 164 (H) 70 - 99 mg/dL    Comment: Glucose reference range applies only to samples taken after fasting for at least 8 hours.   BUN 11 8 - 23 mg/dL   Creatinine, Ser 1.10 0.61 - 1.24 mg/dL   Calcium 8.7 (L) 8.9 - 10.3 mg/dL   Total Protein 7.2 6.5 - 8.1 g/dL   Albumin 3.1 (L) 3.5 - 5.0 g/dL   AST 359 (H) 15 - 41 U/L   ALT 415 (H) 0 - 44 U/L   Alkaline Phosphatase 367 (H) 38 - 126 U/L   Total Bilirubin 5.6 (H) 0.3 - 1.2 mg/dL   GFR, Estimated >60 >60 mL/min    Comment: (NOTE) Calculated using the CKD-EPI Creatinine Equation (2021)    Anion gap 11 5 - 15    Comment: Performed at [facility], [place] [address], [place], [place] [address]  CBC     Status: None   Collection Time: 09/03/21  8:15 PM  Result Value Ref Range   WBC 9.8 4.0 - 10.5 K/uL   RBC 4.44 4.22 - 5.81 MIL/uL   Hemoglobin 14.1 13.0 - 17.0 g/dL   HCT 41.1 39.0 - 52.0 %   MCV 92.6 80.0 - 100.0 fL   MCH 31.8 26.0 - 34.0 pg   MCHC 34.3 30.0 - 36.0 g/dL   RDW 13.2 11.5 - 15.5 %   Platelets 253 150 - 400 K/uL   nRBC 0.0 0.0 - 0.2 %    Comment: Performed at [facility], [place] [address] [place], [place] [address]   CT ABDOMEN PELVIS W  CONTRAST  Addendum Date: 09/03/2021   ADDENDUM REPORT: 09/03/2021 22:42 ADDENDUM: Please note the described inflammatory process at the cholecystectomy bed may be postsurgical. However, developing abscess or the possibility of by leak is not entirely excluded. If there is concern for biliary leak further evaluation with a hepatobiliary scintigraphy is recommended. No drainable fluid collection/abscess at this time. Electronically Signed   By: Laren Everts.D.  On: 09/03/2021 22:42   Result Date: 09/03/2021 CLINICAL DATA:  Abdominal pain. Recent cholecystectomy and hernia repair. EXAM: CT ABDOMEN AND PELVIS WITH CONTRAST TECHNIQUE: Multidetector CT imaging of the abdomen and pelvis was performed using the standard protocol following bolus administration of intravenous contrast. RADIATION DOSE REDUCTION: This exam was performed according to the departmental dose-optimization program which includes automated exposure control, adjustment of the mA and/or kV according to patient size and/or use of iterative reconstruction technique. CONTRAST:  138m OMNIPAQUE IOHEXOL 300 MG/ML  SOLN COMPARISON:  CT abdomen pelvis dated 08/31/2021. FINDINGS: Lower chest: Mild eventration of the left diaphragm. There is bibasilar atelectasis or scarring. Background of emphysema. Small pockets of pneumoperitoneum in the upper abdomen, likely postoperative. No large free air or free fluid. Hepatobiliary: Mild fatty liver. No intrahepatic biliary dilatation. Cholecystectomy. Inflammatory changes with small pockets of air at the cholecystectomy bed, likely postoperative. No drainable fluid collection or abscess. Pancreas: There is fatty infiltration of the pancreas. No active inflammatory changes. No dilatation of the main pancreatic duct. Spleen: Normal in size without focal abnormality. Adrenals/Urinary Tract: The right adrenal glands unremarkable. There is mild nodularity of the left adrenal gland. Small right renal interpolar  cyst. There is no hydronephrosis on either side. There is symmetric enhancement and excretion of contrast by both kidneys. The visualized ureters and the urinary bladder appear unremarkable. Stomach/Bowel: There is sigmoid diverticulosis without active inflammatory changes. Several duodenal diverticula noted. There is no bowel obstruction or active inflammation. The appendix is normal. Vascular/Lymphatic: Moderate aortoiliac atherosclerotic disease. The IVC is unremarkable. No portal venous gas. There is no adenopathy. Reproductive: Brachytherapy seeds noted the prostate gland. The seminal vesicles are symmetric. Other: There is infiltration of the umbilical fat and skin and subcutaneous soft tissues of the periumbilical region, likely related to postoperative hernia repair. No drainable fluid collection or abscess. No hernia. Musculoskeletal: Osteopenia with degenerative changes of the spine. No acute osseous pathology. IMPRESSION: 1. Status post cholecystectomy with inflammatory changes at the cholecystectomy bed, likely postoperative. No drainable fluid collection or abscess. 2. Postoperative umbilical hernia repair.  No fluid collection. 3. Sigmoid diverticulosis. No bowel obstruction. Normal appendix. 4. Aortic Atherosclerosis (ICD10-I70.0) and Emphysema (ICD10-J43.9). Electronically Signed: By: AAnner CreteM.D. On: 09/03/2021 22:15    Review of Systems  Constitutional:  Positive for appetite change.  HENT: Negative.    Eyes: Negative.   Respiratory:  Positive for wheezing.   Cardiovascular: Negative.   Gastrointestinal:  Positive for abdominal distention and abdominal pain.  Endocrine: Negative.   Genitourinary: Negative.   Musculoskeletal: Negative.   Skin:  Positive for wound.  Allergic/Immunologic: Negative.   Neurological: Negative.   Hematological: Negative.   Psychiatric/Behavioral: Negative.      Physical Exam   Blood pressure (!) 147/81, pulse (!) 108, temperature 99.2 F  (37.3 C), temperature source Oral, resp. rate 17, height 6' (1.829 m), weight 90.7 kg, SpO2 92 %.  CONSTITUTIONAL: Moderate discomfort; conversant; no obvious deformities  EYES: pupils equal bilaterally  NECK: trachea midline; no thyroid nodularity  LUNGS: respiratory effort normal & unlabored; bilateral expiratory wheezing  CV: rate and rhythm regular; no significant murmur; no edema bilat lower extremities  GI: abdomen mild distension, relatively soft; erythematous around umbilical wound without drainage; operative dressings on port sites dry; mild to moderate tenderness RUQ with guarding  MSK: normal range of motion of extremities; no clubbing; no cyanosis  PSYCH: appropriate affect for situation; alert and oriented to person, place, & time  LYMPHATIC: no palpable  cervical lymphadenopathy   Assessment/Plan  Status post lap cholecystectomy, POD#3 - Dr. Georgette Dover Abdominal pain, constipation Elevated liver enzymes  Patient will be admitted back to the surgical service.  Empiric abx's will be started due to possible biliary obstruction or leak.  Will require consultation with gastroenterology for possible ERCP.  May benefit from HIDA or MRCP to rule out leak and determine etiology and site of obstruction.  Admit to surgical service Empiric abx's Allow sips, chips, otherwise NPO Pain control Rx nausea prn SSI for DM  Armandina Gemma, MD [facility] A [place] [facility] Office: [phone]   Armandina Gemma, MD 09/03/2021, 11:48 PM

## 2021-09-03 NOTE — ED Triage Notes (Signed)
Ambulatory to ED with c/o severe abd pain. States he had his gallbladder removed and hernia repaired on Monday. States he has not had bowel movement or passed gas since surgery.

## 2021-09-03 NOTE — H&P (Signed)
History and Physical    Patient: Frederick Guzman DOB: 03-28-1950 DOA: 09/03/2021 DOS: the patient was seen and examined on 09/03/2021 PCP: Luetta Nutting, DO  Patient coming from: Home  Chief Complaint:  Chief Complaint  Patient presents with   Abdominal Pain   HPI: Frederick Guzman is a 71 y.o. male with medical history significant of diabetes, asthma, COPD, hypertension, GERD, hyperlipidemia, depression with anxiety, who was admitted and had laparoscopic cholecystectomy 3 days ago.  At the time he had acute cholecystitis.  During the procedure cholangiogram apparently was attempted but was not successfully completed.  Patient was discharged home 2 days ago.  Returned today with abdominal pain nausea with vomiting.  Patient described the pain as 10 out of 10 around the umbilicus.  Unable to have any bowel movement.  Also unable to keep food adequately down without pain.  Was noted to have markedly elevated liver function test.  Surgery subsequently consulted.  They recommended medical consults due to chronic medical problems.  Patient is therefore being admitted to the medical service for further evaluation and treatment.  Review of Systems: As mentioned in the history of present illness. All other systems reviewed and are negative. Past Medical History:  Diagnosis Date   Allergy    Arthritis    OA   Asthma    Cancer ([place]) 08/2014   melanoma on R shoulder & back - [facility]    COPD (chronic obstructive pulmonary disease) ([place])    Depression    pt. denies- 01/2016, pt on Wellbutrin for smoking cessation   Diabetes mellitus without complication ([place])    Diverticulitis    Dyspnea    with exertion   Fibromyalgia    GERD (gastroesophageal reflux disease)    Hyperlipemia    Hypertension    "never was high"   Lymphocytic colitis 04/2018   Pneumonia 06/2015, 10/2016   Sleep apnea    last study - Nov. 2017, waiting for a new machine, using the old CPAP q night after  cleaning the device after he was documented to have pneumonia from his CPAP machine.    Past Surgical History:  Procedure Laterality Date   carpel tunnel Bilateral    CHOLECYSTECTOMY N/A 08/31/2021   Procedure: LAPAROSCOPIC CHOLECYSTECTOMY AND UMBILICAL HERNIA REPAIR;  Surgeon: Donnie Mesa, MD;  [facility];  Service: General;  Laterality: N/A;   COLONOSCOPY W/ POLYPECTOMY     COLONOSCOPY W/ POLYPECTOMY     EXCISIONAL TOTAL SHOULDER ARTHROPLASTY WITH ANTIBIOTIC SPACER Left 08/09/2016   Procedure: LEFT SHOULDER HARDWARE REMOVAL, IRRIGATION AND DEBRIDEMENT, PLACEMENT OF ANTIBIOTIC SPACER;  Surgeon: Netta Cedars, MD;  Location: [place];  Service: Orthopedics;  Laterality: Left;   IRRIGATION AND DEBRIDEMENT SHOULDER Left 03/31/2016   Procedure: IRRIGATION AND DEBRIDEMENT SHOULDER, deep cultures and polyethylene exchange;  Surgeon: Netta Cedars, MD;  Location: [place];  Service: Orthopedics;  Laterality: Left;  requests 38mns   REVERSE SHOULDER ARTHROPLASTY Left 01/30/2016   Procedure: LEFT REVERSE SHOULDER ARTHROPLASTY;  Surgeon: SNetta Cedars MD;  [place] [facility]  Service: Orthopedics;  Laterality: Left;   ROTATOR CUFF REPAIR Left    SHOULDER ARTHROSCOPY WITH DISTAL CLAVICLE RESECTION Right 01/30/2018   Procedure: RIGHT SHOULDER ARTHROSCOPY WITH OPEN DISTAL CLAVICLE RESECTION, SUBACROMIAL DECOMPRESSION;  Surgeon: NNetta Cedars MD;  [place] [facility]  Service: Orthopedics;  Laterality: Right;   STERIOD INJECTION Right 01/30/2018   Procedure: STEROID INJECTION;  Surgeon: NNetta Cedars MD;  [place] [facility]  Service: Orthopedics;  Laterality: Right;  TOTAL SHOULDER REVISION Left 01/17/2017   Procedure: LEFT SHOULDER REVISION REVERSE TOTAL SHOULDER ARTHROPLASTY;  Surgeon: Netta Cedars, MD;  Location: [place];  Service: Orthopedics;  Laterality: Left;   UMBILICAL HERNIA REPAIR N/A 08/31/2021   Procedure: HERNIA REPAIR UMBILICAL ADULT possible;  Surgeon: Donnie Mesa, MD;  [facility];   Service: General;  Laterality: N/A;   WRIST SURGERY Right 11/2014   Dr. Amedeo Plenty   Social History:  reports that he has been smoking cigarettes. He has a 32.25 pack-year smoking history. He has never used smokeless tobacco. He reports current alcohol use of about 9.0 standard drinks of alcohol per week. He reports that he does not use drugs.  Allergies  Allergen Reactions   Methotrexate Derivatives Nausea And Vomiting and Other (See Comments)    Hallucinations and aggressive behavior     Tramadol Nausea And Vomiting    Hallucinations and aggressive behavior     Family History  Problem Relation Age of Onset   Diabetes Mother    Colon cancer Father    Prostate cancer Father    Diabetes Father    Esophageal cancer Neg Hx    Rectal cancer Neg Hx    Stomach cancer Neg Hx     Prior to Admission medications   Medication Sig Start Date End Date Taking? Authorizing Provider  acetaminophen (TYLENOL) 500 MG tablet Take 2 tablets (1,000 mg total) by mouth every 6 (six) hours as needed for mild pain or fever. 09/01/21  Yes Barkley Boards R, PA-C  albuterol (PROVENTIL) (2.5 MG/3ML) 0.083% nebulizer solution Take 2.5 mg by nebulization every 6 (six) hours as needed for wheezing or shortness of breath.   Yes [provider]  albuterol (VENTOLIN HFA) 108 (90 Base) MCG/ACT inhaler Inhale 2 puffs into the lungs every 6 (six) hours as needed for wheezing or shortness of breath.   Yes [provider]  atorvastatin (LIPITOR) 20 MG tablet Take 1 tablet (20 mg total) by mouth every other day. 02/17/21  Yes Hali Marry, MD  b complex vitamins tablet Take 1 tablet by mouth daily.   Yes [provider]  Budeson-Glycopyrrol-Formoterol (BREZTRI AEROSPHERE) 160-9-4.8 MCG/ACT AERO Inhale 1 puff into the lungs daily. 11/14/19  Yes [provider]  buPROPion (WELLBUTRIN XL) 150 MG 24 hr tablet Take 1 tablet (150 mg total) by mouth daily. 08/04/21  Yes Luetta Nutting, DO   cetirizine (ZYRTEC) 10 MG tablet Take 10 mg by mouth daily as needed for allergies.    Yes [provider]  docusate sodium (COLACE) 100 MG capsule Take 1 capsule (100 mg total) by mouth daily as needed for mild constipation. 09/01/21  Yes Norm Parcel, PA-C  esomeprazole (NEXIUM) 40 MG capsule Take 1 capsule (40 mg total) by mouth daily. NO REFILLS. NEEDS TO TRANSITION CARE TO NEW PCP. Patient taking differently: Take 40 mg by mouth daily. 06/15/21  Yes Luetta Nutting, DO  fluticasone (FLONASE) 50 MCG/ACT nasal spray Place 1 spray into both nostrils daily as needed for allergies. 12/30/15  Yes [provider]  furosemide (LASIX) 20 MG tablet Take 1 tablet (20 mg total) by mouth 2 (two) times daily as needed for edema. Patient taking differently: Take 20 mg by mouth daily. 07/24/21  Yes Luetta Nutting, DO  LANTUS SOLOSTAR 100 UNIT/ML Solostar Pen Inject 60 Units into the skin at bedtime. 08/03/21  Yes [provider]  lisinopril (ZESTRIL) 2.5 MG tablet Take 1 tablet (2.5 mg total) by mouth at bedtime. 08/20/21  Yes Breeback, Jade L, PA-C  Multiple Vitamin (MULTIVITAMIN WITH MINERALS) TABS tablet Take 1 tablet by mouth at bedtime.   Yes [provider]  Omega-3 Fatty Acids (FISH OIL) 1000 MG CAPS Take 1,000 mg by mouth at bedtime.    Yes [provider]  oxyCODONE (OXY IR/ROXICODONE) 5 MG immediate release tablet Take 1 tablet (5 mg total) by mouth every 6 (six) hours as needed for moderate pain. 09/01/21  Yes Norm Parcel, PA-C  Polyvinyl Alcohol-Povidone (REFRESH OP) Place 1 drop into both eyes daily as needed (dry eyes).   Yes [provider]  tacrolimus (PROTOPIC) 0.1 % ointment Apply 1 application topically daily. 02/05/19  Yes [provider]  tadalafil (CIALIS) 5 MG tablet Take 5 mg by mouth daily. 12/18/20  Yes [provider]  tamsulosin (FLOMAX) 0.4 MG CAPS capsule Take 0.4 mg by mouth at bedtime.   Yes [provider]  Testosterone 30 MG/ACT SOLN APPLY 1-2 PUMPS DAILY. ALTERNATE TAKING ONE PUMP UNDER ONE ARM ONE DAY, TWO PUMPS THE NEXT DAY (ONE PUMP UNDER EACH ARM) Patient taking differently: Apply 1-2 Pump topically 3 (three) times a week. Apply 1 pump under ONE arm for 1 day- ALTERNATING - 1 pump under each arm for 1 day 04/14/21  Yes Luetta Nutting, DO  vitamin C (ASCORBIC ACID) 500 MG tablet Take 500 mg by mouth daily.   Yes [provider]  glucose blood (PRODIGY NO CODING BLOOD GLUC) test strip Use up to 4 times per day as directed with glucometer. 09/17/20   [name], DO  Insulin Pen Needle 32G X 4 MM MISC For use with Lantus Solostar Patient taking differently: For use with Lantus Solostar. 60 units of insulin 09/15/20   Trixie Dredge, [place]    Physical Exam: Vitals:   09/03/21 2319 09/03/21 2330 09/03/21 2331 09/03/21 2345  BP: (!) 150/75 134/77 134/77 (!) 147/81  Pulse: (!) 106 (!) 105 (!) 105 (!) 108  Resp: 19  17   Temp:      TempSrc:      SpO2: 91% 90% 90% 92%  Weight:      Height:       General: Stable, acutely ill looking, in mild distress due to pain HEENT: PEERL, EOMI, mild jaundice, no pallor Neck is supple, no JVD no lymphadenopathy Respiratory: Good air entry bilaterally, no significant wheeze rales or crackles Cardiovascular system: Regular rate and rhythm Abdomen: Soft, mild diffuse tenderness, no organomegaly Extremities: No edema cyanosis or clubbing Skin exam: No rashes or ulcers  Data Reviewed:  Glucose 164 BUN 11 creatinine 1.1.  Calcium 8.7.  Alkaline phosphatase 367, albumin 3.1 lipase 17.  AST 359 ALT 415.  Total protein 7.2 and total bilirubin 5.6 urinalysis essentially negative.  CT abdomen pelvis which showed status postcholecystectomy with inflammatory changes at the bed no fluid or abscess there was postoperative umbilical hernia repair and sigmoid diverticulosis  Assessment and Plan:   #1 transaminitis:  Postoperative.  Most likely possible cholangitis or biliary obstruction.  No fever no white count.  At this point we will address it with surgery.  Patient will be admitted to the medical service with surgical consultation.  May be a candidate for IV antibiotics if agreeable to surgery.  #2 diabetes: Initiate sliding scale insulin.  #3 essential hypertension: Blood pressure markedly elevated.  Initiate home regimen if tolerated otherwise IV hydralazine  #4 GERD: Continue PPIs  #5 obstructive sleep apnea: Consider CPAP at night  #6  hyperlipidemia: Continue home regimen of statin  #7 BPH: Continue home regimen      Advance Care Planning:   Code Status: Prior full code  Consults: Dr. Audie Pinto, general surgery  Family Communication: No family at bedside  Severity of Illness: The appropriate patient status for this patient is INPATIENT. Inpatient status is judged to be reasonable and necessary in order to provide the required intensity of service to ensure the patient's safety. The patient's presenting symptoms, physical exam findings, and initial radiographic and laboratory data in the context of their chronic comorbidities is felt to place them at high risk for further clinical deterioration. Furthermore, it is not anticipated that the patient will be medically stable for discharge from the hospital within 2 midnights of admission.   * I certify that at the point of admission it is my clinical judgment that the patient will require inpatient hospital care spanning beyond 2 midnights from the point of admission due to high intensity of service, high risk for further deterioration and high frequency of surveillance required.*  AuthorBarbette Merino, MD 09/03/2021 11:58 PM  For on call review [email or web address].

## 2021-09-04 ENCOUNTER — Inpatient Hospital Stay (HOSPITAL_COMMUNITY): Payer: Medicare Other

## 2021-09-04 DIAGNOSIS — R1084 Generalized abdominal pain: Secondary | ICD-10-CM | POA: Diagnosis not present

## 2021-09-04 DIAGNOSIS — K831 Obstruction of bile duct: Secondary | ICD-10-CM | POA: Diagnosis present

## 2021-09-04 DIAGNOSIS — R748 Abnormal levels of other serum enzymes: Secondary | ICD-10-CM | POA: Diagnosis not present

## 2021-09-04 HISTORY — DX: Obstruction of bile duct: K83.1

## 2021-09-04 LAB — CBC
HCT: 38.9 % — ABNORMAL LOW (ref 39.0–52.0)
Hemoglobin: 13.2 g/dL (ref 13.0–17.0)
MCH: 32.1 pg (ref 26.0–34.0)
MCHC: 33.9 g/dL (ref 30.0–36.0)
MCV: 94.6 fL (ref 80.0–100.0)
Platelets: 225 10*3/uL (ref 150–400)
RBC: 4.11 MIL/uL — ABNORMAL LOW (ref 4.22–5.81)
RDW: 13.6 % (ref 11.5–15.5)
WBC: 7.6 10*3/uL (ref 4.0–10.5)
nRBC: 0 % (ref 0.0–0.2)

## 2021-09-04 LAB — URINALYSIS, ROUTINE W REFLEX MICROSCOPIC
Bacteria, UA: NONE SEEN
Bilirubin Urine: NEGATIVE
Glucose, UA: NEGATIVE mg/dL
Hgb urine dipstick: NEGATIVE
Ketones, ur: 20 mg/dL — AB
Leukocytes,Ua: NEGATIVE
Nitrite: NEGATIVE
Protein, ur: 30 mg/dL — AB
Specific Gravity, Urine: >1.046 — ABNORMAL HIGH (ref 1.005–1.030)
pH: 8 (ref 5.0–8.0)

## 2021-09-04 LAB — COMPREHENSIVE METABOLIC PANEL
ALT: 368 U/L — ABNORMAL HIGH (ref 0–44)
AST: 271 U/L — ABNORMAL HIGH (ref 15–41)
Albumin: 2.8 g/dL — ABNORMAL LOW (ref 3.5–5.0)
Alkaline Phosphatase: 335 U/L — ABNORMAL HIGH (ref 38–126)
Anion gap: 7 (ref 5–15)
BUN: 12 mg/dL (ref 8–23)
CO2: 28 mmol/L (ref 22–32)
Calcium: 8.3 mg/dL — ABNORMAL LOW (ref 8.9–10.3)
Chloride: 101 mmol/L (ref 98–111)
Creatinine, Ser: 0.97 mg/dL (ref 0.61–1.24)
GFR, Estimated: >60 mL/min (ref >60)
Glucose, Bld: 123 mg/dL — ABNORMAL HIGH (ref 70–99)
Potassium: 3.9 mmol/L (ref 3.5–5.1)
Sodium: 136 mmol/L (ref 135–145)
Total Bilirubin: 5.6 mg/dL — ABNORMAL HIGH (ref 0.3–1.2)
Total Protein: 6.6 g/dL (ref 6.5–8.1)

## 2021-09-04 LAB — GLUCOSE, CAPILLARY
Glucose-Capillary: 105 mg/dL — ABNORMAL HIGH (ref 70–99)
Glucose-Capillary: 113 mg/dL — ABNORMAL HIGH (ref 70–99)
Glucose-Capillary: 124 mg/dL — ABNORMAL HIGH (ref 70–99)
Glucose-Capillary: 127 mg/dL — ABNORMAL HIGH (ref 70–99)
Glucose-Capillary: 137 mg/dL — ABNORMAL HIGH (ref 70–99)
Glucose-Capillary: 156 mg/dL — ABNORMAL HIGH (ref 70–99)

## 2021-09-04 LAB — BILIRUBIN, DIRECT: Bilirubin, Direct: 4 mg/dL — ABNORMAL HIGH (ref 0.0–0.2)

## 2021-09-04 IMAGING — MR MR MRCP
5 of 10 series · 25 of 48 positions shown · non-contrast
Comparison: CT on [DATE]

CLINICAL DATA: Abdominal pain. Jaundice. Recent cholecystectomy and
hernia repair.

EXAM:
MRI ABDOMEN WITHOUT CONTRAST  (INCLUDING MRCP)
TECHNIQUE: Multiplanar multisequence MR imaging of the abdomen was performed.
Heavily T2-weighted images of the biliary and pancreatic ducts were
obtained, and three-dimensional MRCP images were rendered by post
processing. Patient could not complete the entire exam and IV
contrast could not be administered.

[Series 3: T2 fat-sat · axial · 6.0mm · 1.34mm/px · z∈[-241,+76]mm · 4 of 45 slices shown]
[im 1/45]
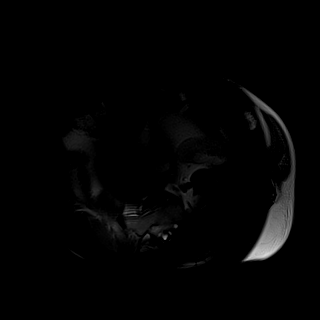
[im 15/45]
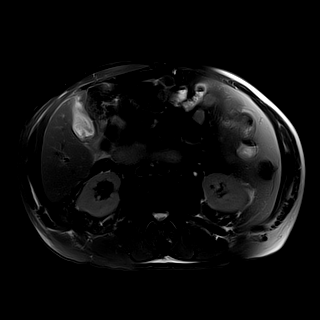
[im 30/45]
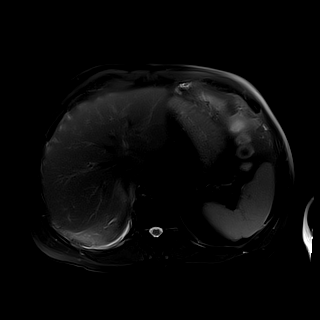
[im 45/45]
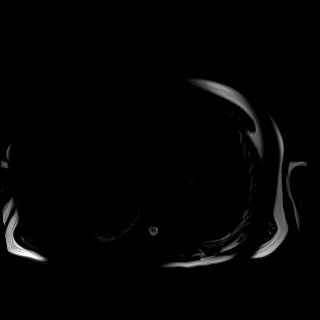

[Series 5: DWI · axial · 6.0mm · 1.60mm/px · z∈[-229,+74]mm · 7 of 86 slices shown (1 of 2)]
[im 1/86]
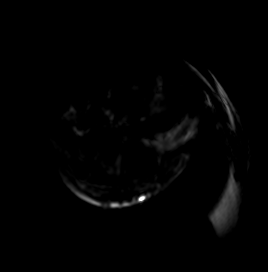
[im 15/86]
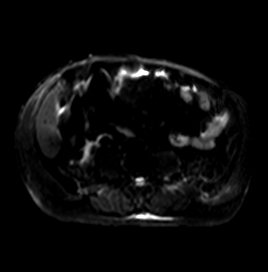
[im 29/86]
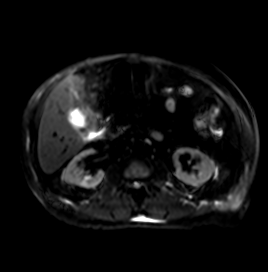
[im 43/86]
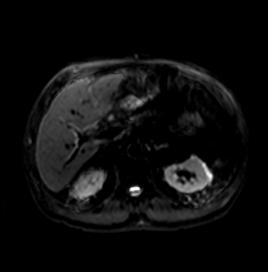
[im 57/86]
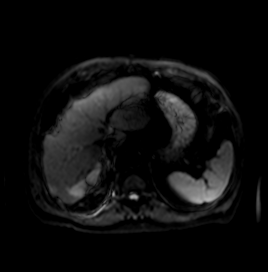
[im 71/86]
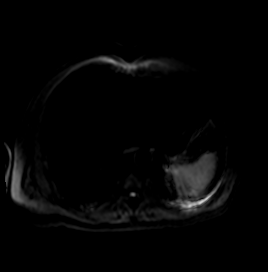
[im 86/86]
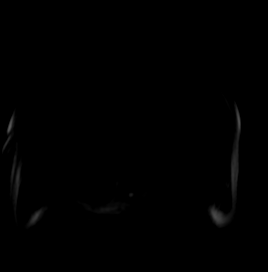

[Series 6: DWI · axial · 6.0mm · 1.60mm/px · z∈[-229,+74]mm · 4 of 43 slices shown (2 of 2)]
[im 1/43]
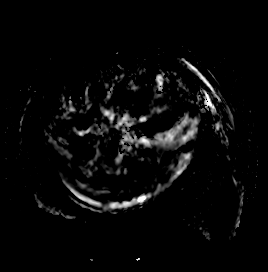
[im 15/43]
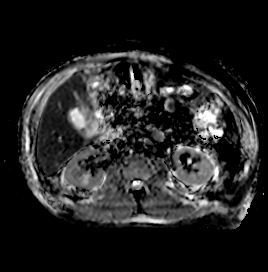
[im 29/43]
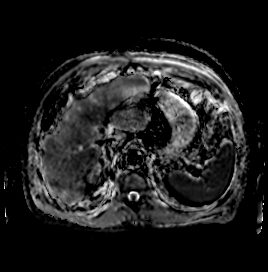
[im 43/43]
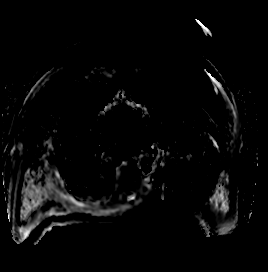

[Series 8: cor_3d_spc_trig · coronal · 1.0mm · 0.49mm/px · 6 of 72 slices shown]
[im 1/72]
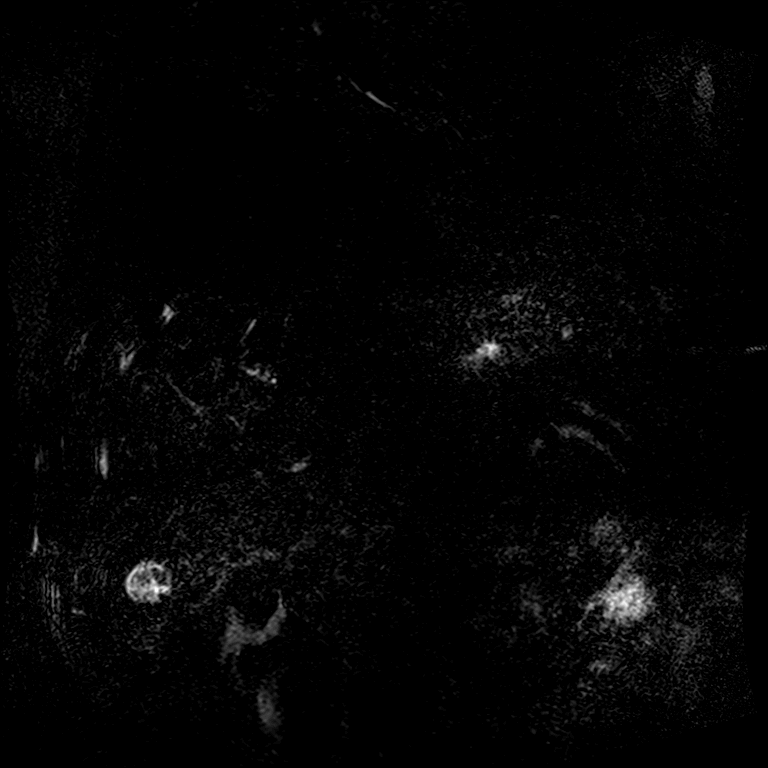
[im 15/72]
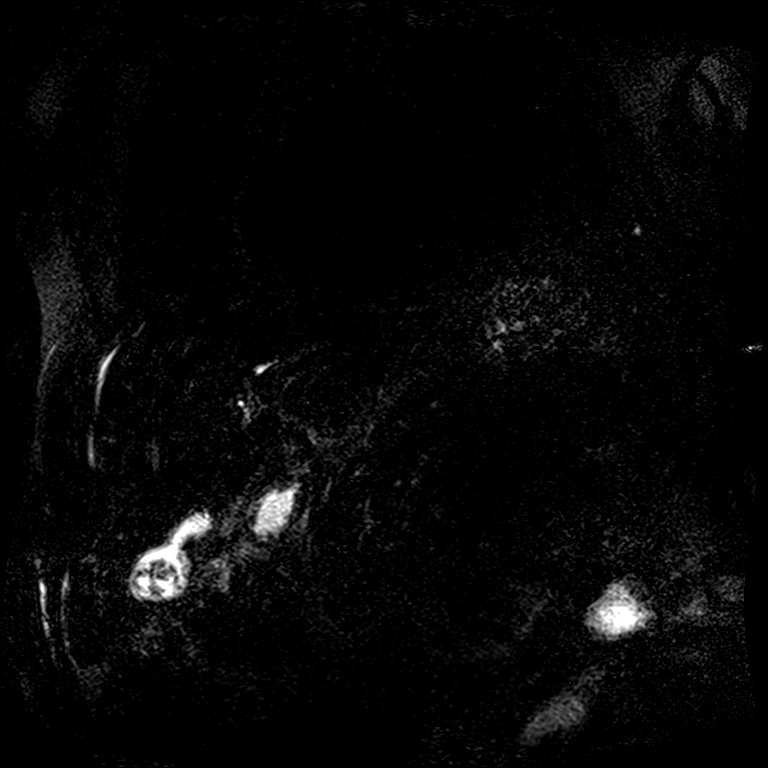
[im 29/72]
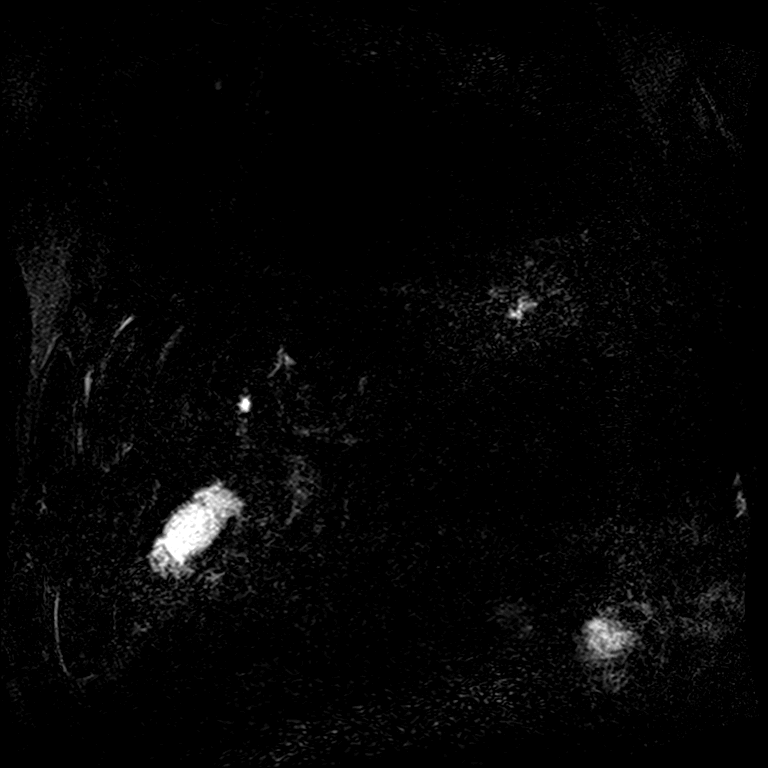
[im 43/72]
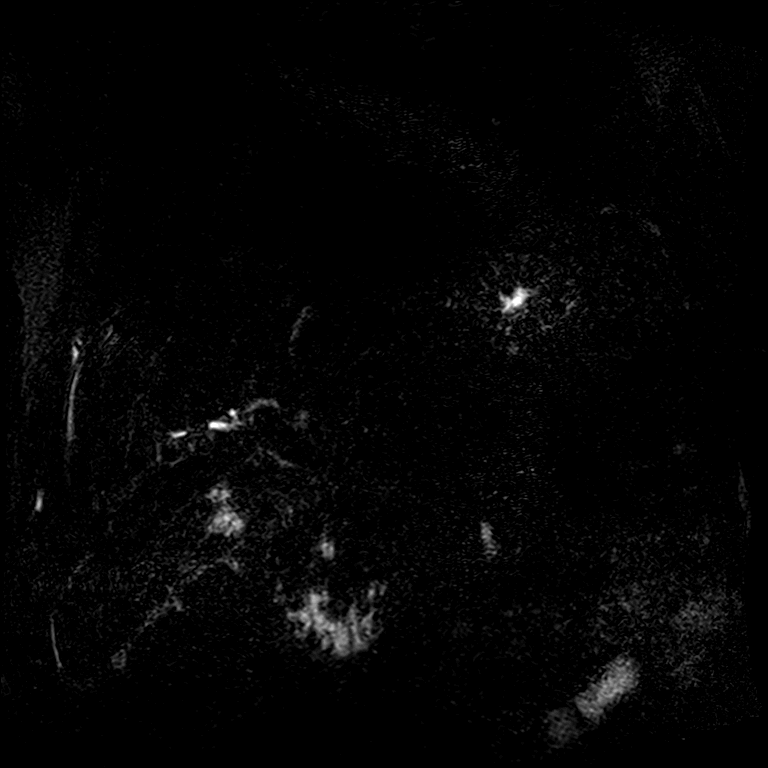
[im 57/72]
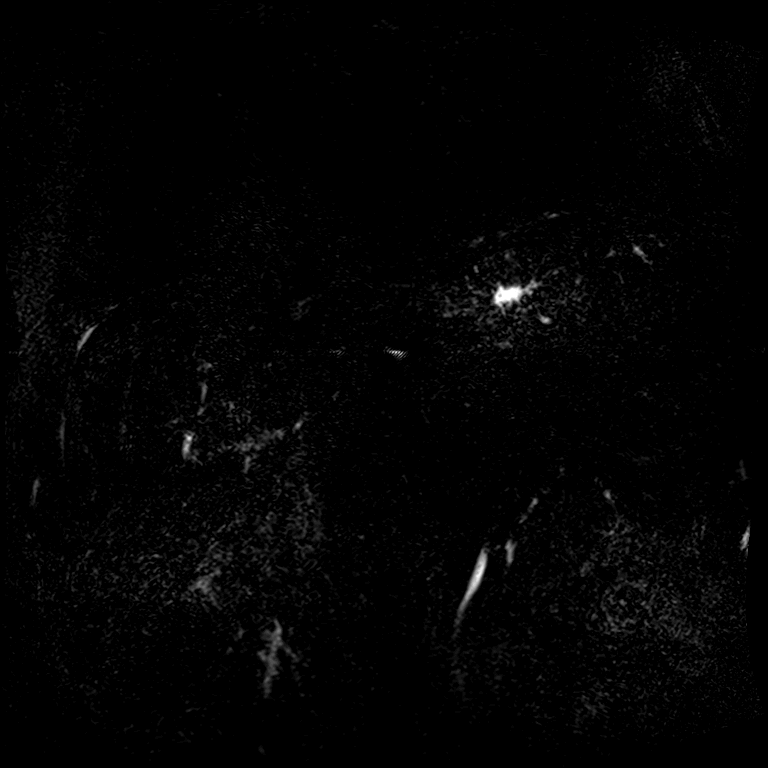
[im 72/72]
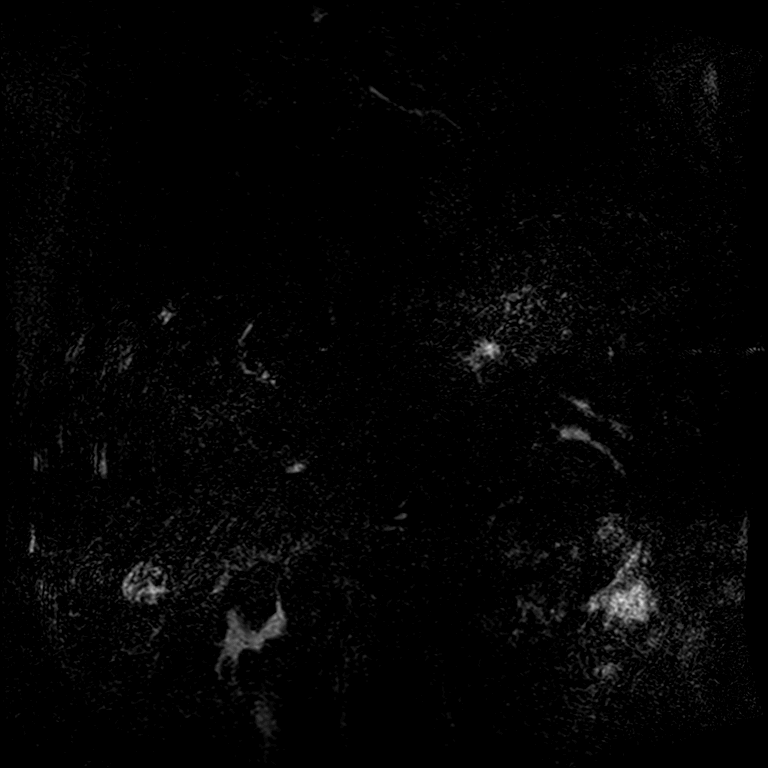

[Series 12: T2 · coronal · 6.0mm · 1.64mm/px · 4 of 43 slices shown]
[im 1/43]
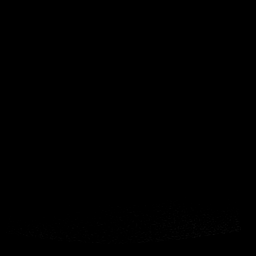
[im 15/43]
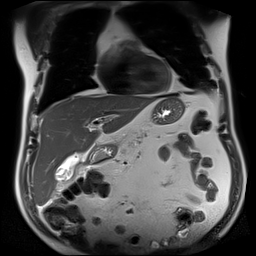
[im 29/43]
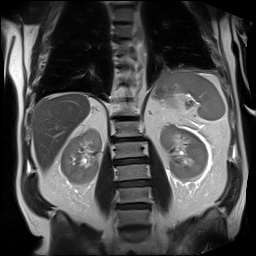
[im 43/43]
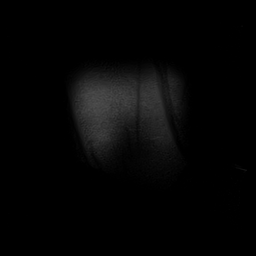

[25 of 48 positions shown; findings below may reference images not displayed]

FINDINGS: Lower chest: Mild right lower lobe atelectasis.

Hepatobiliary: No masses visualized on this unenhanced exam. Recent
cholecystectomy noted. A postoperative fluid collection is seen
within the gallbladder fossa measuring 5.2 x 2.8 cm, and postop bile
leak cannot be excluded.

No evidence of biliary ductal dilatation or choledocholithiasis.

Pancreas: No mass or inflammatory process visualized on this
unenhanced exam. No evidence of pancreatic ductal dilatation or
pancreas divisum.

Spleen:  Within normal limits in size.

Adrenals/Urinary tract: Small benign-appearing right renal cyst
noted. No evidence of renal mass or hydronephrosis.

Stomach/Bowel: Unremarkable.

Vascular/Lymphatic: No pathologically enlarged lymph nodes
identified. No evidence of abdominal aortic aneurysm.

Other:  None.

Musculoskeletal:  No suspicious bone lesions identified.
IMPRESSION: Recent cholecystectomy. 5.2 x 2.8 cm postoperative fluid collection
within the gallbladder fossa. Postop bile leak cannot be excluded.
Consider nuclear medicine hepatobiliary scan for further evaluation.

No evidence of biliary ductal dilatation or choledocholithiasis.

## 2021-09-04 IMAGING — MR MR 3D RECON AT SCANNER
5 of 10 series · 25 of 48 positions shown · non-contrast
Comparison: CT on [DATE]

CLINICAL DATA: Abdominal pain. Jaundice. Recent cholecystectomy and
hernia repair.

EXAM:
MRI ABDOMEN WITHOUT CONTRAST  (INCLUDING MRCP)
TECHNIQUE: Multiplanar multisequence MR imaging of the abdomen was performed.
Heavily T2-weighted images of the biliary and pancreatic ducts were
obtained, and three-dimensional MRCP images were rendered by post
processing. Patient could not complete the entire exam and IV
contrast could not be administered.

[Series 3: T2 fat-sat · axial · 6.0mm · 1.34mm/px · z∈[-241,+76]mm · 4 of 45 slices shown]
[im 1/45]
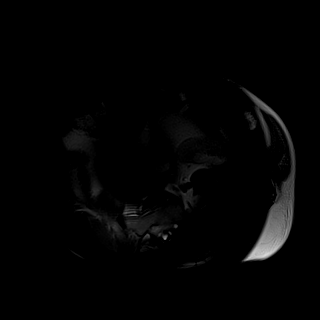
[im 15/45]
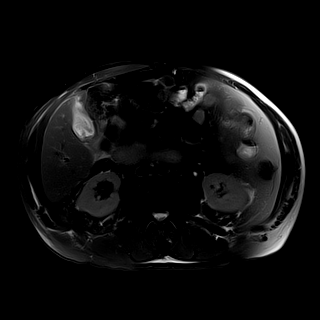
[im 30/45]
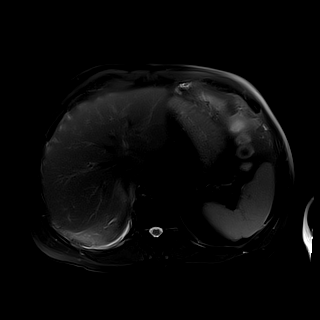
[im 45/45]
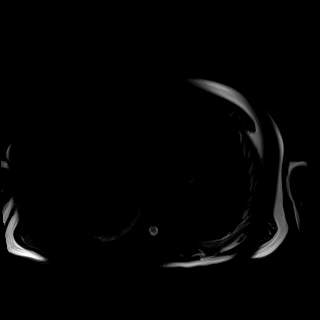

[Series 5: DWI · axial · 6.0mm · 1.60mm/px · z∈[-229,+74]mm · 7 of 86 slices shown (1 of 2)]
[im 1/86]
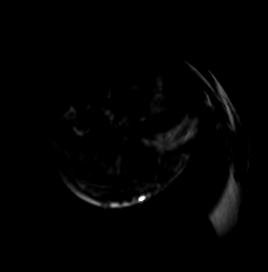
[im 15/86]
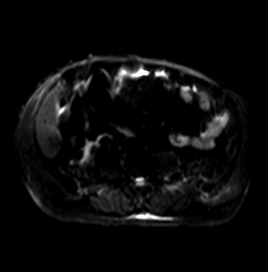
[im 29/86]
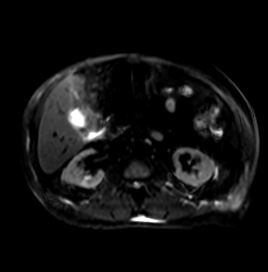
[im 43/86]
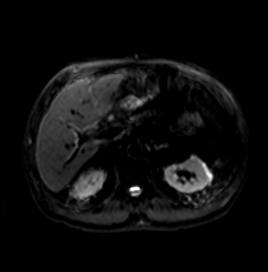
[im 57/86]
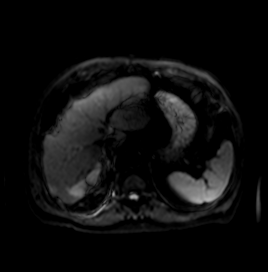
[im 71/86]
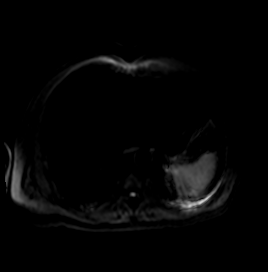
[im 86/86]
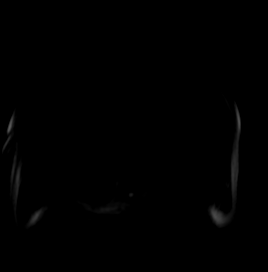

[Series 6: DWI · axial · 6.0mm · 1.60mm/px · z∈[-229,+74]mm · 4 of 43 slices shown (2 of 2)]
[im 1/43]
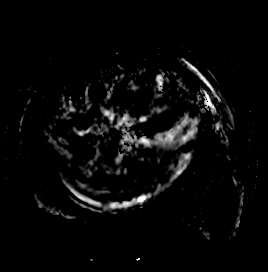
[im 15/43]
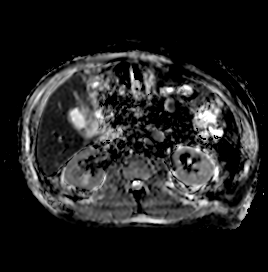
[im 29/43]
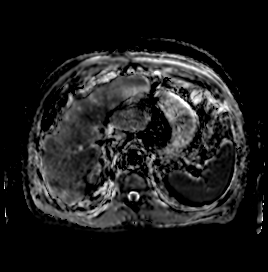
[im 43/43]
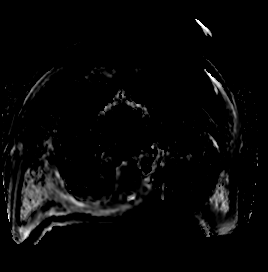

[Series 8: cor_3d_spc_trig · coronal · 1.0mm · 0.49mm/px · 6 of 72 slices shown]
[im 1/72]
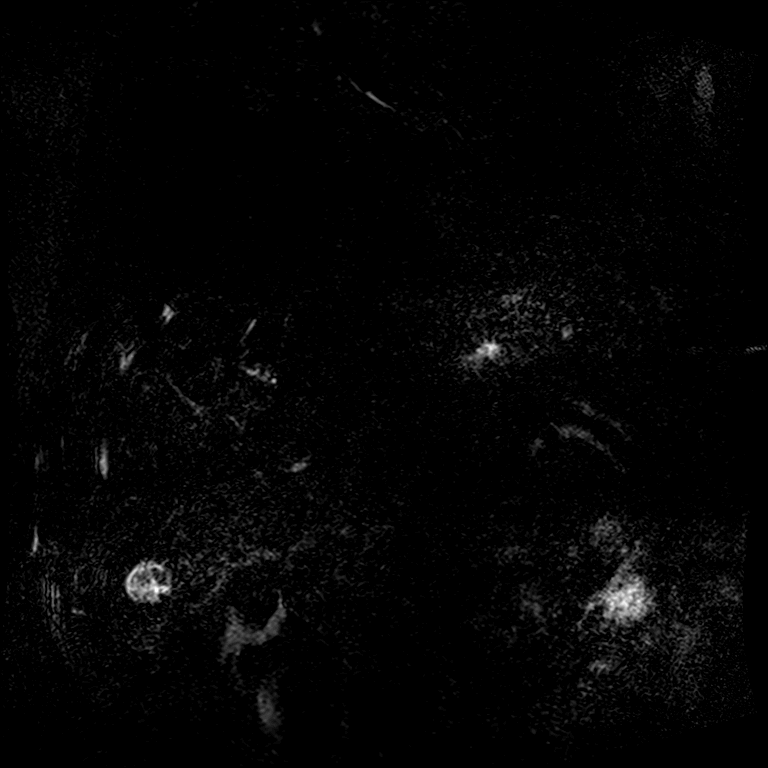
[im 15/72]
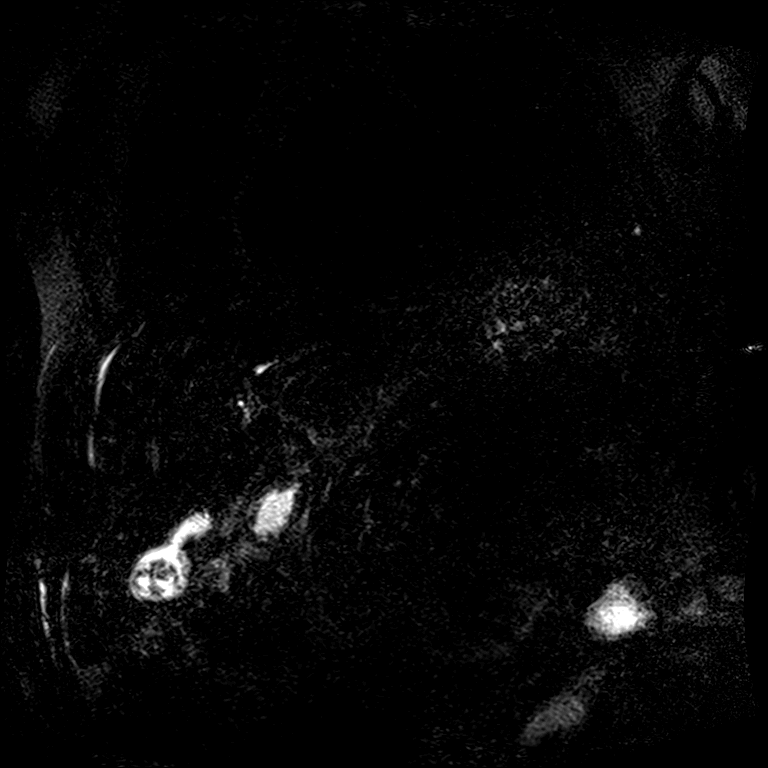
[im 29/72]
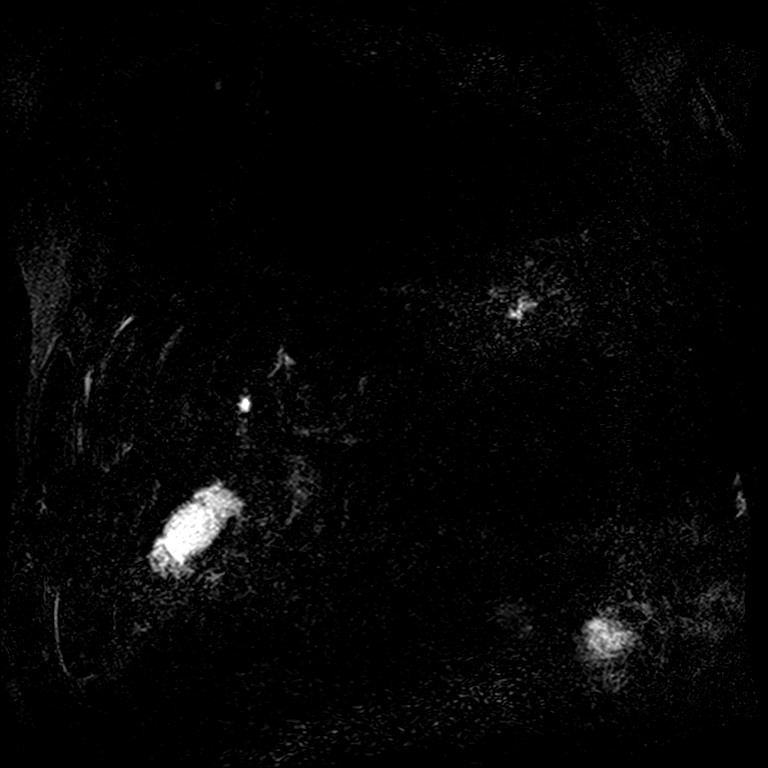
[im 43/72]
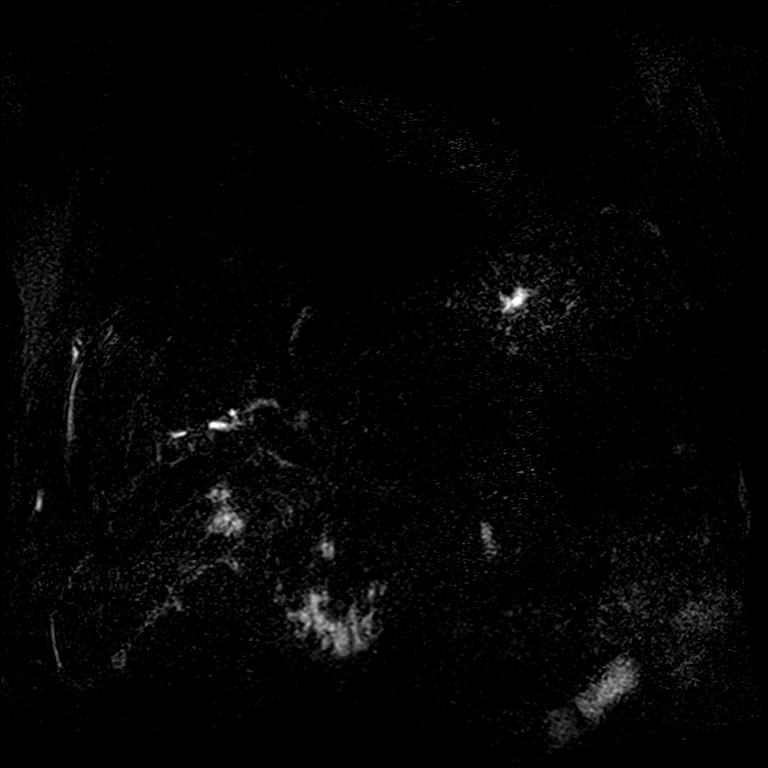
[im 57/72]
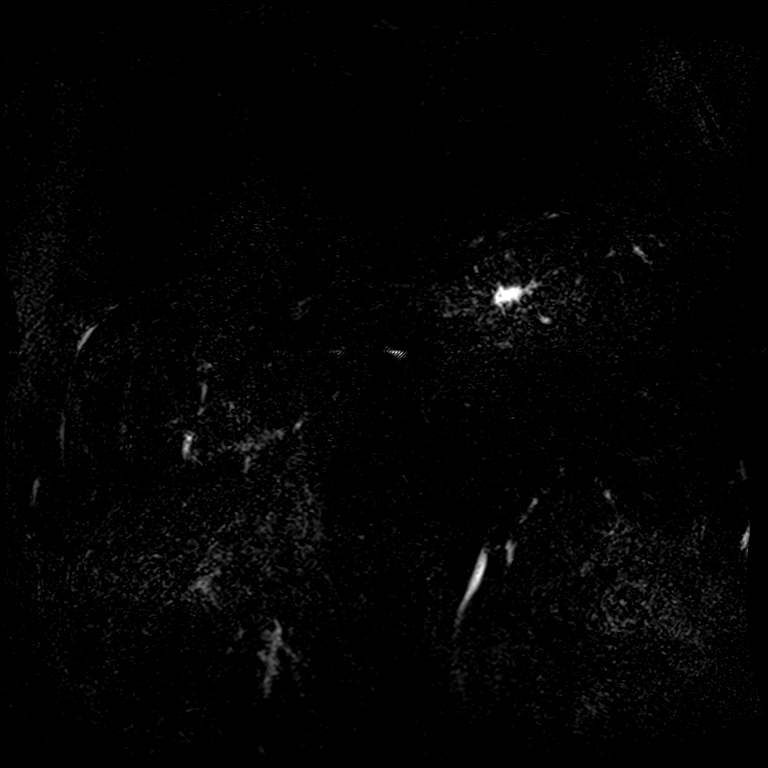
[im 72/72]
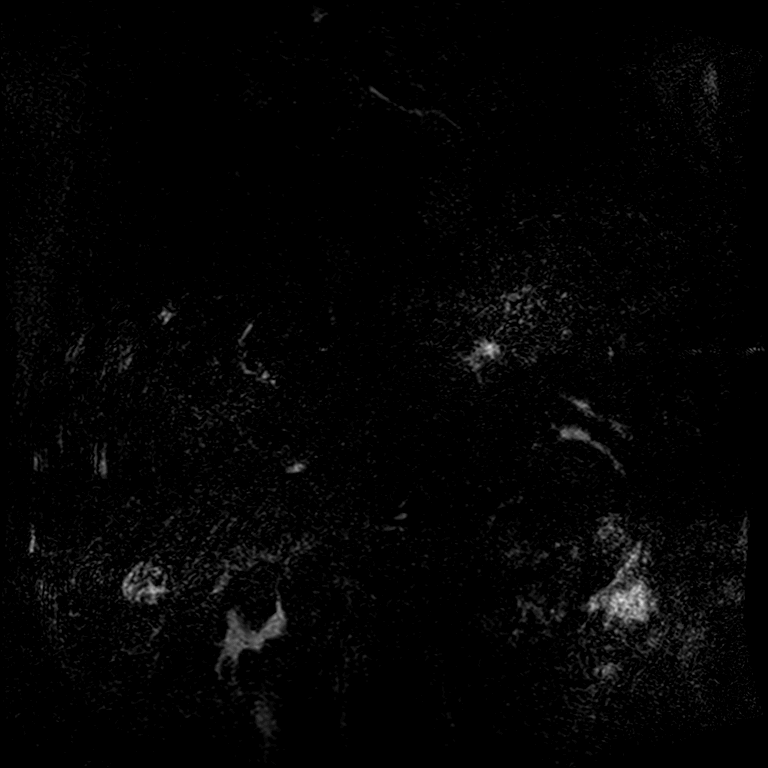

[Series 12: T2 · coronal · 6.0mm · 1.64mm/px · 4 of 43 slices shown]
[im 1/43]
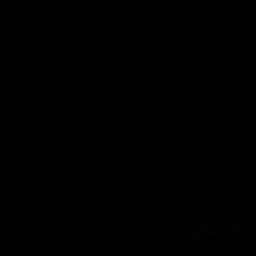
[im 15/43]
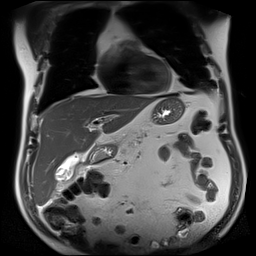
[im 29/43]
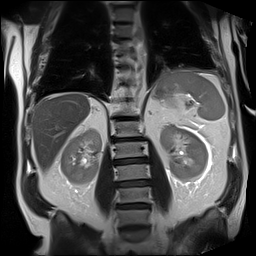
[im 43/43]
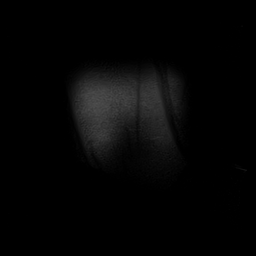

[25 of 48 positions shown; findings below may reference images not displayed]

FINDINGS: Lower chest: Mild right lower lobe atelectasis.

Hepatobiliary: No masses visualized on this unenhanced exam. Recent
cholecystectomy noted. A postoperative fluid collection is seen
within the gallbladder fossa measuring 5.2 x 2.8 cm, and postop bile
leak cannot be excluded.

No evidence of biliary ductal dilatation or choledocholithiasis.

Pancreas: No mass or inflammatory process visualized on this
unenhanced exam. No evidence of pancreatic ductal dilatation or
pancreas divisum.

Spleen:  Within normal limits in size.

Adrenals/Urinary tract: Small benign-appearing right renal cyst
noted. No evidence of renal mass or hydronephrosis.

Stomach/Bowel: Unremarkable.

Vascular/Lymphatic: No pathologically enlarged lymph nodes
identified. No evidence of abdominal aortic aneurysm.

Other:  None.

Musculoskeletal:  No suspicious bone lesions identified.
IMPRESSION: Recent cholecystectomy. 5.2 x 2.8 cm postoperative fluid collection
within the gallbladder fossa. Postop bile leak cannot be excluded.
Consider nuclear medicine hepatobiliary scan for further evaluation.

No evidence of biliary ductal dilatation or choledocholithiasis.

## 2021-09-04 IMAGING — NM NM HEPATOBILIARY SCAN
2 series · 12 of 12 positions shown · non-contrast
Comparison: Abdominal MRI [DATE]. Abdominopelvic CT [DATE].

CLINICAL DATA: Recurrent right upper quadrant abdominal pain and
elevated liver function studies status post cholecystectomy
[DATE].

EXAM:
NUCLEAR MEDICINE HEPATOBILIARY IMAGING
TECHNIQUE: Sequential images of the abdomen were obtained [DATE] minutes
following intravenous administration of radiopharmaceutical.
RADIOPHARMACEUTICALS:  7.7 mCi [G3]  Choletec IV

[Series 1: hepato leak · 3.28mm/px · 6 of 60 frames shown]
[frame 6/60]
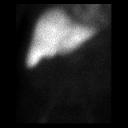
[frame 16/60]
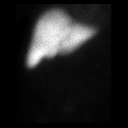
[frame 26/60]
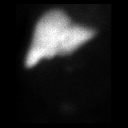
[frame 36/60]
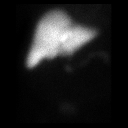
[frame 46/60]
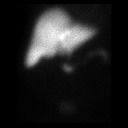
[frame 56/60]
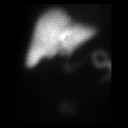

[Series 1: 2 hr leak · 3.28mm/px · 6 of 60 frames shown]
[frame 6/60]
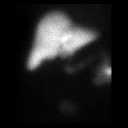
[frame 16/60]
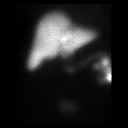
[frame 26/60]
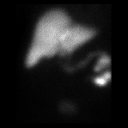
[frame 36/60]
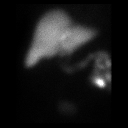
[frame 46/60]
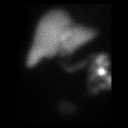
[frame 56/60]
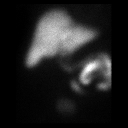

[12 of 12 positions shown; findings below may reference images not displayed]

FINDINGS: Prompt uptake and biliary excretion of activity by the liver is
seen. There is normal passage of activity into the small bowel.
There is no abnormal accumulation of activity beyond the biliary
system or small bowel. Specifically, no evidence of bile leak.
IMPRESSION: Unremarkable hepatobiliary scan status post recent cholecystectomy.
No evidence of biliary obstruction or leak.

## 2021-09-04 MED ORDER — ONDANSETRON 4 MG PO TBDP: 4.0000 mg | ORAL_TABLET | Freq: Four times a day (QID) | ORAL | Status: DC | PRN

## 2021-09-04 MED ORDER — LISINOPRIL 5 MG PO TABS
2.5000 mg | ORAL_TABLET | Freq: Every day | ORAL | Status: DC
Start: 2021-09-04 — End: 2021-09-05

## 2021-09-04 MED ORDER — ALBUTEROL SULFATE (2.5 MG/3ML) 0.083% IN NEBU: 3.0000 mL | INHALATION_SOLUTION | Freq: Four times a day (QID) | RESPIRATORY_TRACT | Status: DC | PRN

## 2021-09-04 MED ORDER — ONDANSETRON HCL 4 MG/2ML IJ SOLN: 4.0000 mg | Freq: Four times a day (QID) | INTRAMUSCULAR | Status: DC | PRN

## 2021-09-04 MED ORDER — ACETAMINOPHEN 650 MG RE SUPP: 650.0000 mg | Freq: Four times a day (QID) | RECTAL | Status: DC | PRN

## 2021-09-04 MED ORDER — ACETAMINOPHEN 325 MG PO TABS: 650.0000 mg | ORAL_TABLET | Freq: Four times a day (QID) | ORAL | Status: DC | PRN

## 2021-09-04 MED ORDER — HYDROMORPHONE HCL 1 MG/ML IJ SOLN
1.0000 mg | INTRAMUSCULAR | Status: DC | PRN
  Administered 2021-09-04 – 2021-09-05 (×7): 1 mg via INTRAVENOUS
  Filled 2021-09-04 (×7): qty 1

## 2021-09-04 MED ORDER — SODIUM CHLORIDE 0.9 % IV SOLN: INTRAVENOUS | Status: DC

## 2021-09-04 MED ORDER — PIPERACILLIN-TAZOBACTAM 3.375 G IVPB
3.3750 g | Freq: Three times a day (TID) | INTRAVENOUS | Status: DC
  Administered 2021-09-04 – 2021-09-05 (×4): 3.375 g via INTRAVENOUS
  Filled 2021-09-04 (×4): qty 50

## 2021-09-04 MED ORDER — TECHNETIUM TC 99M MEBROFENIN IV KIT
7.7000 | PACK | Freq: Once | INTRAVENOUS | Status: AC
  Administered 2021-09-04: 7.7 via INTRAVENOUS

## 2021-09-04 MED ORDER — POTASSIUM CHLORIDE IN NACL 20-0.45 MEQ/L-% IV SOLN
INTRAVENOUS | Status: DC
  Filled 2021-09-04 (×3): qty 1000

## 2021-09-04 MED ORDER — ALBUTEROL SULFATE (2.5 MG/3ML) 0.083% IN NEBU: 2.5000 mg | INHALATION_SOLUTION | Freq: Four times a day (QID) | RESPIRATORY_TRACT | Status: DC | PRN

## 2021-09-04 MED ORDER — SODIUM CHLORIDE 0.9 % IV SOLN
2.0000 g | INTRAVENOUS | Status: DC
  Administered 2021-09-04: 2 g via INTRAVENOUS
  Filled 2021-09-04: qty 20

## 2021-09-04 MED ORDER — FLUTICASONE PROPIONATE 50 MCG/ACT NA SUSP: 1.0000 | Freq: Every day | NASAL | Status: DC | PRN

## 2021-09-04 MED ORDER — BUDESON-GLYCOPYRROL-FORMOTEROL 160-9-4.8 MCG/ACT IN AERO: 1.0000 | INHALATION_SPRAY | Freq: Every day | RESPIRATORY_TRACT | Status: DC

## 2021-09-04 MED ORDER — LORATADINE 10 MG PO TABS
10.0000 mg | ORAL_TABLET | Freq: Every day | ORAL | Status: DC
  Filled 2021-09-04: qty 1

## 2021-09-04 MED ORDER — INSULIN ASPART 100 UNIT/ML IJ SOLN: 0.0000 [IU] | Freq: Three times a day (TID) | INTRAMUSCULAR | Status: DC

## 2021-09-04 MED ORDER — OXYCODONE HCL 5 MG PO TABS: 5.0000 mg | ORAL_TABLET | Freq: Four times a day (QID) | ORAL | Status: DC | PRN

## 2021-09-04 MED ORDER — OMEGA-3-ACID ETHYL ESTERS 1 G PO CAPS
1000.0000 mg | ORAL_CAPSULE | Freq: Every day | ORAL | Status: DC
Start: 2021-09-04 — End: 2021-09-04

## 2021-09-04 MED ORDER — OXYCODONE HCL 5 MG PO TABS: 5.0000 mg | ORAL_TABLET | ORAL | Status: DC | PRN

## 2021-09-04 MED ORDER — PANTOPRAZOLE SODIUM 40 MG PO TBEC
40.0000 mg | DELAYED_RELEASE_TABLET | Freq: Every day | ORAL | Status: DC
  Administered 2021-09-04: 40 mg via ORAL
  Filled 2021-09-04 (×2): qty 1

## 2021-09-04 MED ORDER — INSULIN ASPART 100 UNIT/ML IJ SOLN
0.0000 [IU] | INTRAMUSCULAR | Status: DC
  Administered 2021-09-05: 3 [IU] via SUBCUTANEOUS

## 2021-09-04 MED ORDER — UMECLIDINIUM BROMIDE 62.5 MCG/ACT IN AEPB
1.0000 | INHALATION_SPRAY | Freq: Every day | RESPIRATORY_TRACT | Status: DC
  Filled 2021-09-04: qty 7

## 2021-09-04 MED ORDER — ONDANSETRON HCL 4 MG PO TABS
4.0000 mg | ORAL_TABLET | Freq: Four times a day (QID) | ORAL | Status: DC | PRN
Start: 2021-09-04 — End: 2021-09-04

## 2021-09-04 MED ORDER — ADULT MULTIVITAMIN W/MINERALS CH: 1.0000 | ORAL_TABLET | Freq: Every day | ORAL | Status: DC

## 2021-09-04 MED ORDER — ATORVASTATIN CALCIUM 20 MG PO TABS
20.0000 mg | ORAL_TABLET | ORAL | Status: DC
  Filled 2021-09-04: qty 1

## 2021-09-04 MED ORDER — B COMPLEX-C PO TABS
1.0000 | ORAL_TABLET | Freq: Every day | ORAL | Status: DC
  Filled 2021-09-04: qty 1

## 2021-09-04 MED ORDER — DOCUSATE SODIUM 100 MG PO CAPS
100.0000 mg | ORAL_CAPSULE | Freq: Every day | ORAL | Status: DC | PRN
Start: 2021-09-04 — End: 2021-09-05

## 2021-09-04 MED ORDER — INSULIN ASPART 100 UNIT/ML IJ SOLN: 0.0000 [IU] | Freq: Every day | INTRAMUSCULAR | Status: DC

## 2021-09-04 MED ORDER — MOMETASONE FURO-FORMOTEROL FUM 100-5 MCG/ACT IN AERO
2.0000 | INHALATION_SPRAY | Freq: Two times a day (BID) | RESPIRATORY_TRACT | Status: DC
  Filled 2021-09-04: qty 8.8

## 2021-09-04 MED ORDER — TAMSULOSIN HCL 0.4 MG PO CAPS: 0.4000 mg | ORAL_CAPSULE | Freq: Every day | ORAL | Status: DC

## 2021-09-04 MED ORDER — HYDROMORPHONE HCL 1 MG/ML IJ SOLN: 1.0000 mg | INTRAMUSCULAR | Status: DC | PRN

## 2021-09-04 MED ORDER — ASCORBIC ACID 500 MG PO TABS
500.0000 mg | ORAL_TABLET | Freq: Every day | ORAL | Status: DC
  Filled 2021-09-04: qty 1

## 2021-09-04 MED ORDER — BUPROPION HCL ER (XL) 150 MG PO TB24
150.0000 mg | ORAL_TABLET | Freq: Every day | ORAL | Status: DC
  Filled 2021-09-04 (×2): qty 1

## 2021-09-04 MED ORDER — TACROLIMUS 0.1 % EX OINT
1.0000 | TOPICAL_OINTMENT | Freq: Every day | CUTANEOUS | Status: DC
Start: 2021-09-04 — End: 2021-09-05

## 2021-09-04 NOTE — Plan of Care (Signed)
  Problem: Education: Goal: Knowledge of General Education information will improve Description Including pain rating scale, medication(s)/side effects and non-pharmacologic comfort measures Outcome: Progressing   Problem: Health Behavior/Discharge Planning: Goal: Ability to manage health-related needs will improve Outcome: Progressing   

## 2021-09-04 NOTE — Progress Notes (Signed)
This AM, patient stated that he refuses to take medications from the hospital such as Wellbutrin and Flomax (some home meds that he takes). He is willing to take antibiotics, pain meds, and have IV fluids while hospitalized. RN discussed with patient the medication policy here (that if the pharmacy has the medications that he takes, that he cannot have his own medications at the bedside). Patient had pill box of his home meds, but did not have the medication bottles that the meds in the pill box go with. Called pharmacy to discuss patient's request to have his own home meds dispensed at the pharmacy. Pharmacy said the patient's own medication bottles would have to be brought down to pharmacy for dispensing. Patient's wife did not have the med bottles with her. Patient's wife was not willing to get the med bottles from home. Patient then requested to not take some meds such as Flomax and Wellbutrin until he is discharged. Patient was given the option of having his own medication bottles taken down to pharmacy. Patient and his wife refused that option. Patient's wife aware to take pill box home. Will continue to monitor.

## 2021-09-04 NOTE — Progress Notes (Signed)
Transition of Care [facility]) Screening Note  Patient Details  Name: Frederick Guzman Date of Birth: 05-Oct-1950  Transition of Care [facility]) CM/SW Contact:    Sherie Don, LCSW Phone Number: 09/04/2021, 10:41 AM  Transition of Care Department [facility]) has reviewed patient and no TOC needs have been identified at this time. We will continue to monitor patient advancement through interdisciplinary progression rounds. If new patient transition needs arise, please place a TOC consult.

## 2021-09-04 NOTE — Consult Note (Signed)
Referring Provider: Barkley Boards PA-C Primary Care Physician:  Luetta Nutting, DO Primary Gastroenterologist:  Dr. Wilfrid Lund  Reason for Consultation: LFTs, suspect choledocholithiasis  HPI: Frederick Guzman is a 71 y.o. male with a past medical history of depression, arthritis, myalgia, asthma, COPD, hypertension, hyperlipidemia, diabetes mellitus type 2, malignant melanoma to the right shoulder and back, hepatic steatosis, GERD, diverticulosis, lymphocytic colitis and  colon polyps.  He was admitted to the hospital 08/31/2021 with RUQ pain x 3 days. Labs in the ED showed WBC count 13.8.  Hemoglobin 14.9.  Total bili 1.3.  Alk phos 61.  AST 16.  ALT 23.  Lipase 20.  INR 1.3.  CTAP identified acute cholecystitis and umbilical hernia containing fat and small portion of small bowel.  He underwent a laparoscopic cholecystectomy and umbilical hernia repair by Dr. Georgette Dover 08/31/2021. The operative report confirmed severe acute cholecystitis, omental adhesions in the upper abdomen with a 1.5 cm fascial defect at the umbilicus.  An IOC was attempted but Dr. Rigoberto Noel was unable to shoot a cholangiogram due to a leak, possibly from a stone in the distal cystic duct.  He was able to pass about 3 cm of the catheter into the cystic duct without signs of obstruction.  His postoperative course was uncomplicated, his diet was advanced and he was discharged home 09/01/2021.  He developed generalized upper abdominal pain, worse to the RUQ with abdominal distention which progressively worsened over the past two days. No BM x 5 days. He presented to [facility] ED 09/03/2021 for further evaluation.  Labs in the ED showed a WBC count of 9.8.  Hemoglobin 14.1.  Total bili 5.7 (T. Bili 1.1 on 6/12).  Alk phos 367.  AST 359.  ALT 415. He was evaluated by general surgery who assessed his CT findings were consistent with postop changes in the gallbladder bed without a definitive leak or abscess.  An abdominal MRI/MRCP was  ordered to rule out choledocholithiasis and a GI consult was requested for further evaluation.  Possible ERCP if choledocholithiasis confirmed.  Since he was discharged from the hospital status postcholecystectomy, he basically maintained a clear liquid diet and ate a few pieces of bread.  He developed  worsening upper abdominal, notably worse to the RUQ area over the past 2 days. He had mild nausea without vomiting.  No heartburn on Nexium 40 mg p.o. daily.  He was also constipated and took a stool softener two days ago and yesterday he took 3 stool softeners, 2 doses of milk of magnesia and a Dulcolax suppository.  He passed a large darker brown loose brown stool earlier this morning without significant relief.  No black stools or rectal bleeding.  No NSAID use.  He currently rates his abdominal pain as 7 out of 10.  His wife is at the bedside.   ABDOMINAL IMAGE STUDIES:  CTAP 09/03/2021: FINDINGS: Lower chest: Mild eventration of the left diaphragm. There is bibasilar atelectasis or scarring. Background of emphysema.   Small pockets of pneumoperitoneum in the upper abdomen, likely postoperative. No large free air or free fluid.   Hepatobiliary: Mild fatty liver. No intrahepatic biliary dilatation. Cholecystectomy. Inflammatory changes with small pockets of air at the cholecystectomy bed, likely postoperative. No drainable fluid collection or abscess.   Pancreas: There is fatty infiltration of the pancreas. No active inflammatory changes. No dilatation of the main pancreatic duct.   Spleen: Normal in size without focal abnormality.   Adrenals/Urinary Tract: The right adrenal glands  unremarkable. There is mild nodularity of the left adrenal gland. Small right renal interpolar cyst. There is no hydronephrosis on either side. There is symmetric enhancement and excretion of contrast by both kidneys. The visualized ureters and the urinary bladder appear unremarkable.   Stomach/Bowel: There  is sigmoid diverticulosis without active inflammatory changes. Several duodenal diverticula noted. There is no bowel obstruction or active inflammation. The appendix is normal.   Vascular/Lymphatic: Moderate aortoiliac atherosclerotic disease. The IVC is unremarkable. No portal venous gas. There is no adenopathy.   Reproductive: Brachytherapy seeds noted the prostate gland. The seminal vesicles are symmetric.   Other: There is infiltration of the umbilical fat and skin and subcutaneous soft tissues of the periumbilical region, likely related to postoperative hernia repair. No drainable fluid collection or abscess. No hernia.   Musculoskeletal: Osteopenia with degenerative changes of the spine. No acute osseous pathology.   IMPRESSION: 1. Status post cholecystectomy with inflammatory changes at the cholecystectomy bed, likely postoperative. No drainable fluid collection or abscess. 2. Postoperative umbilical hernia repair.  No fluid collection. 3. Sigmoid diverticulosis. No bowel obstruction. Normal appendix. 4. Aortic Atherosclerosis (ICD10-I70.0) and Emphysema (ICD10-J43.9).    CTAP with contrast 08/31/2021: Lower chest: No acute abnormality. Fibrotic changes at the lung bases.   Hepatobiliary: Liver is unremarkable. Gallbladder is distended with significant surrounding infiltration. No stone identified.   Pancreas: Unremarkable.   Spleen: Unremarkable.   Adrenals/Urinary Tract: Adrenals, kidneys, and partially distended bladder are unremarkable.   Stomach/Bowel: Stomach is within normal limits. Bowel is normal in caliber. Distal colonic diverticulosis. Normal appendix.   Vascular/Lymphatic: Atherosclerosis.  No enlarged nodes.   Reproductive: Stable appearance of the prostate.   Other: Trace free fluid in the dependent pelvis. Umbilical hernia containing a small portion of small bowel together with fat.   Musculoskeletal: No acute osseous abnormality.    IMPRESSION: Acute cholecystitis.  No stone identified by CT.   Umbilical hernia contains fat and a small portion of small bowel.  Please note the described inflammatory process at the cholecystectomy bed may be postsurgical. However, developing abscess or the possibility of by leak is not entirely excluded. If there is concern for biliary leak further evaluation with a hepatobiliary scintigraphy is recommended. No drainable fluid collection/abscess at this time  PAST GI PROCEDURES:  Colonoscopy 05/11/2018: - The examined portion of the ileum was normal. - Two diminutive polyps in the ascending colon and in the cecum, removed with a cold snare. Resected and retrieved. - Diverticulosis in the left colon. - Colon mucosa biopsied to rule out microscopic colitis. - The examination was otherwise normal on direct and retroflexion views. - 7 year recall  1. Surgical [P], cecal, ascending, polyp (2) - TUBULAR ADENOMA(S). - HIGH GRADE DYSPLASIA IS NOT IDENTIFIED. 2. Surgical [P], right colon - FINDINGS CONSISTENT WITH LYMPHOCYTIC COLITIS. - THERE IS NO EVIDENCE OF DYSPLASIA OR MALIGNANCY. 3. Surgical [P], left colon - FINDINGS CONSISTENT WITH LYMPHOCYTIC COLITIS. - THERE IS NO EVIDENCE OF DYSPLASIA OR MALIGNANCY     Past Medical History:  Diagnosis Date   Allergy    Arthritis    OA   Asthma    Cancer ([place]) 08/2014   melanoma on R shoulder & back - [facility]    COPD (chronic obstructive pulmonary disease) ([place])    Depression    pt. denies- 01/2016, pt on Wellbutrin for smoking cessation   Diabetes mellitus without complication (HCC)    Diverticulitis    Dyspnea    with exertion  Fibromyalgia    GERD (gastroesophageal reflux disease)    Hyperlipemia    Hypertension    "never was high"   Lymphocytic colitis 04/2018   Pneumonia 06/2015, 10/2016   Sleep apnea    last study - Nov. 2017, waiting for a new machine, using the old CPAP q night after cleaning the device after he  was documented to have pneumonia from his CPAP machine.     Past Surgical History:  Procedure Laterality Date   carpel tunnel Bilateral    CHOLECYSTECTOMY N/A 08/31/2021   Procedure: LAPAROSCOPIC CHOLECYSTECTOMY AND UMBILICAL HERNIA REPAIR;  Surgeon: Donnie Mesa, MD;  Location: [facility];  Service: General;  Laterality: N/A;   COLONOSCOPY W/ POLYPECTOMY     COLONOSCOPY W/ POLYPECTOMY     EXCISIONAL TOTAL SHOULDER ARTHROPLASTY WITH ANTIBIOTIC SPACER Left 08/09/2016   Procedure: LEFT SHOULDER HARDWARE REMOVAL, IRRIGATION AND DEBRIDEMENT, PLACEMENT OF ANTIBIOTIC SPACER;  Surgeon: Netta Cedars, MD;  Location: [place];  Service: Orthopedics;  Laterality: Left;   IRRIGATION AND DEBRIDEMENT SHOULDER Left 03/31/2016   Procedure: IRRIGATION AND DEBRIDEMENT SHOULDER, deep cultures and polyethylene exchange;  Surgeon: Netta Cedars, MD;  Location: [place];  Service: Orthopedics;  Laterality: Left;  requests 29mns   REVERSE SHOULDER ARTHROPLASTY Left 01/30/2016   Procedure: LEFT REVERSE SHOULDER ARTHROPLASTY;  Surgeon: SNetta Cedars MD;  Location[place] [facility]  Service: Orthopedics;  Laterality: Left;   ROTATOR CUFF REPAIR Left    SHOULDER ARTHROSCOPY WITH DISTAL CLAVICLE RESECTION Right 01/30/2018   Procedure: RIGHT SHOULDER ARTHROSCOPY WITH OPEN DISTAL CLAVICLE RESECTION, SUBACROMIAL DECOMPRESSION;  Surgeon: NNetta Cedars MD;  [place] [facility]  Service: Orthopedics;  Laterality: Right;   STERIOD INJECTION Right 01/30/2018   Procedure: STEROID INJECTION;  Surgeon: NNetta Cedars MD;  [place] [facility]  Service: Orthopedics;  Laterality: Right;   TOTAL SHOULDER REVISION Left 01/17/2017   Procedure: LEFT SHOULDER REVISION REVERSE TOTAL SHOULDER ARTHROPLASTY;  Surgeon: NNetta Cedars MD;  [facility] [facility]  Service: Orthopedics;  Laterality: Left;   UMBILICAL HERNIA REPAIR N/A 08/31/2021   Procedure: HERNIA REPAIR UMBILICAL ADULT possible;  Surgeon: TDonnie Mesa MD;  Location: [facility];  Service: General;  Laterality:  N/A;   WRIST SURGERY Right 11/2014   Dr. GAmedeo Plenty   Prior to Admission medications   Medication Sig Start Date End Date Taking? Authorizing Provider  acetaminophen (TYLENOL) 500 MG tablet Take 2 tablets (1,000 mg total) by mouth every 6 (six) hours as needed for mild pain or fever. 09/01/21  Yes JBarkley BoardsR, PA-C  albuterol (PROVENTIL) (2.5 MG/3ML) 0.083% nebulizer solution Take 2.5 mg by nebulization every 6 (six) hours as needed for wheezing or shortness of breath.   Yes [provider]  albuterol (VENTOLIN HFA) 108 (90 Base) MCG/ACT inhaler Inhale 2 puffs into the lungs every 6 (six) hours as needed for wheezing or shortness of breath.   Yes [provider]  atorvastatin (LIPITOR) 20 MG tablet Take 1 tablet (20 mg total) by mouth every other day. 02/17/21  Yes MHali Marry MD  b complex vitamins tablet Take 1 tablet by mouth daily.   Yes [provider]  Budeson-Glycopyrrol-Formoterol (BREZTRI AEROSPHERE) 160-9-4.8 MCG/ACT AERO Inhale 1 puff into the lungs daily. 11/14/19  Yes [provider]  buPROPion (WELLBUTRIN XL) 150 MG 24 hr tablet Take 1 tablet (150 mg total) by mouth daily. 08/04/21  Yes MLuetta Nutting DO  cetirizine (ZYRTEC) 10 MG tablet Take 10 mg by mouth daily as needed for allergies.  Yes [provider]  docusate sodium (COLACE) 100 MG capsule Take 1 capsule (100 mg total) by mouth daily as needed for mild constipation. 09/01/21  Yes Norm Parcel, PA-C  esomeprazole (NEXIUM) 40 MG capsule Take 1 capsule (40 mg total) by mouth daily. NO REFILLS. NEEDS TO TRANSITION CARE TO NEW PCP. Patient taking differently: Take 40 mg by mouth daily. 06/15/21  Yes Luetta Nutting, DO  fluticasone (FLONASE) 50 MCG/ACT nasal spray Place 1 spray into both nostrils daily as needed for allergies. 12/30/15  Yes [provider]  furosemide (LASIX) 20 MG tablet Take 1 tablet (20 mg total) by mouth 2 (two) times daily as needed for  edema. Patient taking differently: Take 20 mg by mouth daily. 07/24/21  Yes Luetta Nutting, DO  LANTUS SOLOSTAR 100 UNIT/ML Solostar Pen Inject 60 Units into the skin at bedtime. 08/03/21  Yes [provider]  lisinopril (ZESTRIL) 2.5 MG tablet Take 1 tablet (2.5 mg total) by mouth at bedtime. 08/20/21  Yes Breeback, Jade L, PA-C  Multiple Vitamin (MULTIVITAMIN WITH MINERALS) TABS tablet Take 1 tablet by mouth at bedtime.   Yes [provider]  Omega-3 Fatty Acids (FISH OIL) 1000 MG CAPS Take 1,000 mg by mouth at bedtime.    Yes [provider]  oxyCODONE (OXY IR/ROXICODONE) 5 MG immediate release tablet Take 1 tablet (5 mg total) by mouth every 6 (six) hours as needed for moderate pain. 09/01/21  Yes Norm Parcel, PA-C  Polyvinyl Alcohol-Povidone (REFRESH OP) Place 1 drop into both eyes daily as needed (dry eyes).   Yes [provider]  tacrolimus (PROTOPIC) 0.1 % ointment Apply 1 application topically daily. 02/05/19  Yes [provider]  tadalafil (CIALIS) 5 MG tablet Take 5 mg by mouth daily. 12/18/20  Yes [provider]  tamsulosin (FLOMAX) 0.4 MG CAPS capsule Take 0.4 mg by mouth at bedtime.   Yes [provider]  Testosterone 30 MG/ACT SOLN APPLY 1-2 PUMPS DAILY. ALTERNATE TAKING ONE PUMP UNDER ONE ARM ONE DAY, TWO PUMPS THE NEXT DAY (ONE PUMP UNDER EACH ARM) Patient taking differently: Apply 1-2 Pump topically 3 (three) times a week. Apply 1 pump under ONE arm for 1 day- ALTERNATING - 1 pump under each arm for 1 day 04/14/21  Yes Luetta Nutting, DO  vitamin C (ASCORBIC ACID) 500 MG tablet Take 500 mg by mouth daily.   Yes [provider]  glucose blood (PRODIGY NO CODING BLOOD GLUC) test strip Use up to 4 times per day as directed with glucometer. 09/17/20   [name], DO  Insulin Pen Needle 32G X 4 MM MISC For use with Lantus Solostar Patient taking differently: For use with Lantus Solostar. 60 units of insulin  09/15/20   Trixie Dredge, PA-C    Current Facility-Administered Medications  Medication Dose Route Frequency Provider Last Rate Last Admin   0.45 % NaCl with KCl 20 mEq / L infusion   Intravenous Continuous Armandina Gemma, MD       acetaminophen (TYLENOL) tablet 650 mg  650 mg Oral Q6H PRN Armandina Gemma, MD       Or   acetaminophen (TYLENOL) suppository 650 mg  650 mg Rectal Q6H PRN Armandina Gemma, MD       albuterol (PROVENTIL) (2.5 MG/3ML) 0.083% nebulizer solution 3 mL  3 mL Inhalation Q6H PRN Elwyn Reach, MD       ascorbic acid (VITAMIN C) tablet 500 mg  500 mg Oral Daily Gala Romney  L, MD       atorvastatin (LIPITOR) tablet 20 mg  20 mg Oral QODAY Elwyn Reach, MD       B-complex with vitamin C tablet 1 tablet  1 tablet Oral Daily Jonelle Sidle, Mohammad L, MD       buPROPion (WELLBUTRIN XL) 24 hr tablet 150 mg  150 mg Oral Daily Garba, Mohammad L, MD       docusate sodium (COLACE) capsule 100 mg  100 mg Oral Daily PRN Elwyn Reach, MD       fluticasone (FLONASE) 50 MCG/ACT nasal spray 1 spray  1 spray Each Nare Daily PRN Elwyn Reach, MD       HYDROmorphone (DILAUDID) injection 1 mg  1 mg Intravenous Q2H PRN Armandina Gemma, MD   1 mg at 09/04/21 0819   insulin aspart (novoLOG) injection 0-6 Units  0-6 Units Subcutaneous Q4H Lavina Hamman, MD       lisinopril (ZESTRIL) tablet 2.5 mg  2.5 mg Oral QHS Elwyn Reach, MD       loratadine (CLARITIN) tablet 10 mg  10 mg Oral Daily Garba, Mohammad L, MD       mometasone-formoterol (DULERA) 100-5 MCG/ACT inhaler 2 puff  2 puff Inhalation BID Elwyn Reach, MD       multivitamin with minerals tablet 1 tablet  1 tablet Oral QHS Jonelle Sidle, Mohammad L, MD       omega-3 acid ethyl esters (LOVAZA) capsule 1,000 mg  1,000 mg Oral QHS Jonelle Sidle, Mohammad L, MD       ondansetron (ZOFRAN) tablet 4 mg  4 mg Oral Q6H PRN Elwyn Reach, MD       Or   ondansetron (ZOFRAN) injection 4 mg  4 mg Intravenous Q6H PRN Elwyn Reach, MD       ondansetron (ZOFRAN-ODT) disintegrating tablet 4 mg  4 mg Oral Q6H PRN Armandina Gemma, MD       Or   ondansetron (ZOFRAN) injection 4 mg  4 mg Intravenous Q6H PRN Armandina Gemma, MD       oxyCODONE (Oxy IR/ROXICODONE) immediate release tablet 5-10 mg  5-10 mg Oral Q4H PRN Armandina Gemma, MD       pantoprazole (PROTONIX) EC tablet 40 mg  40 mg Oral Daily Jonelle Sidle, Mohammad L, MD   40 mg at 09/04/21 0800   piperacillin-tazobactam (ZOSYN) IVPB 3.375 g  3.375 g Intravenous Q8H Garba, Mohammad L, MD 12.5 mL/hr at 09/04/21 0202 3.375 g at 09/04/21 0202   tacrolimus (PROTOPIC) 0.1 % ointment 1 Application  1 Application Topical Daily Elwyn Reach, MD       tamsulosin (FLOMAX) capsule 0.4 mg  0.4 mg Oral QHS Garba, Mohammad L, MD       umeclidinium bromide (INCRUSE ELLIPTA) 62.5 MCG/ACT 1 puff  1 puff Inhalation Daily Elwyn Reach, MD        Allergies as of 09/03/2021 - Review Complete 09/03/2021  Allergen Reaction Noted   Methotrexate derivatives Nausea And Vomiting and Other (See Comments) 01/09/2014   Tramadol Nausea And Vomiting 11/28/2012    Family History  Problem Relation Age of Onset   Diabetes Mother    Colon cancer Father    Prostate cancer Father    Diabetes Father    Esophageal cancer Neg Hx    Rectal cancer Neg Hx    Stomach cancer Neg Hx     Social History   Socioeconomic History   Marital status: Soil scientist  Spouse name: Webb Silversmith   Number of children: 2   Years of education: 12   Highest education level: 12th grade  Occupational History   Occupation: lineman    Comment: retired  Tobacco Use   Smoking status: Every Day    Packs/day: 0.75    Years: 43.00    Total pack years: 32.25    Types: Cigarettes   Smokeless tobacco: Never   Tobacco comments:    pt no longer taking chantix  Vaping Use   Vaping Use: Never used  Substance and Sexual Activity   Alcohol use: Yes    Alcohol/week: 9.0 standard drinks of alcohol    Types: 6 Cans of beer, 3  Shots of liquor per week    Comment: beer or mixed drink    Drug use: No   Sexual activity: Yes  Other Topics Concern   Not on file  Social History Narrative   Lives with his girlfriend, Webb Silversmith. Retired. DOes a lot of yard work and handy work. Members of the camping club.   Social Determinants of Health   Financial Resource Strain: Low Risk  (08/05/2021)   Overall Financial Resource Strain (CARDIA)    Difficulty of Paying Living Expenses: Not hard at all  Food Insecurity: No Food Insecurity (08/05/2021)   Hunger Vital Sign    Worried About Running Out of Food in the Last Year: Never true    Ran Out of Food in the Last Year: Never true  Transportation Needs: No Transportation Needs (08/05/2021)   PRAPARE - Hydrologist (Medical): No    Lack of Transportation (Non-Medical): No  Physical Activity: Inactive (08/05/2021)   Exercise Vital Sign    Days of Exercise per Week: 0 days    Minutes of Exercise per Session: 0 min  Stress: No Stress Concern Present (08/05/2021)   [place]    Feeling of Stress : Only a little  Social Connections: Socially Integrated (08/05/2021)   Social Connection and Isolation Panel [NHANES]    Frequency of Communication with Friends and Family: More than three times a week    Frequency of Social Gatherings with Friends and Family: More than three times a week    Attends Religious Services: More than 4 times per year    Active Member of [employer] or Organizations: Yes    Attends Music therapist: More than 4 times per year    Marital Status: Married  Human resources officer Violence: Not At Risk (08/05/2021)   Humiliation, Afraid, Rape, and Kick questionnaire    Fear of Current or Ex-Partner: No    Emotionally Abused: No    Physically Abused: No    Sexually Abused: No    Review of Systems: Gen: Denies fever, sweats or chills. No weight loss.  CV: Denies chest  pain, palpitations or edema. Resp: Denies cough, shortness of breath of hemoptysis.  GI: See HPI.   GU : Denies urinary burning, blood in urine, increased urinary frequency or incontinence. MS: Denies joint pain, muscles aches or weakness. Derm: Denies rash, itchiness, skin lesions or unhealing ulcers. Psych: Denies depression, anxiety or memory loss Heme: Denies easy bruising, bleeding. Neuro:  Denies headaches, dizziness or paresthesias. Endo:  + DM II.   Physical Exam: Vital signs in last 24 hours: Temp:  [99.1 F (37.3 C)-99.2 F (37.3 C)] 99.1 F (37.3 C) (06/16 0142) Pulse Rate:  [102-118] 110 (06/16 0142) Resp:  [17-19] 18 (06/16  0142) BP: (130-173)/(66-87) 152/68 (06/16 0142) SpO2:  [90 %-100 %] 92 % (06/16 0142) Weight:  [90.7 kg] 90.7 kg (06/15 1939) Last BM Date : 08/29/21 General:  Alert 71 year old male in no acute distress. Head:  Normocephalic and atraumatic. Eyes:  No scleral icterus. Conjunctiva pink. Ears:  Normal auditory acuity. Nose:  No deformity, discharge or lesions. Mouth:  Dentition intact. No ulcers or lesions.  Neck:  Supple. No lymphadenopathy or thyromegaly.  Lungs: Breath sounds clear throughout. Heart: Regular rate and rhythm, no murmurs Abdomen: Distended abdomen with significant tenderness to light and deep palpation throughout > RUQ and right mid abdomen without rebound or guarding.  Upper scopic CCY and umbilical hernia incisions intact.  Hypoactive bowel sounds to all 4 quadrants. Rectal: Deferred. Musculoskeletal:  Symmetrical without gross deformities.  Pulses:  Normal pulses noted. Extremities:  Without clubbing or edema. Neurologic:  Alert and  oriented x 4. No focal deficits.  Skin:  Intact without significant lesions or rashes.  No jaundice. Psych:  Alert and cooperative. Normal mood and affect.  Intake/Output from previous day: 06/15 0701 - 06/16 0700 In: 478 [I.V.:337; IV Piggyback:141] Out: -  Intake/Output this shift: No  intake/output data recorded.  Lab Results: Recent Labs    09/03/21 2015  WBC 9.8  HGB 14.1  HCT 41.1  PLT 253   BMET Recent Labs    09/03/21 2015  NA 137  K 3.9  CL 99  CO2 27  GLUCOSE 164*  BUN 11  CREATININE 1.10  CALCIUM 8.7*   LFT Recent Labs    09/03/21 2015  PROT 7.2  ALBUMIN 3.1*  AST 359*  ALT 415*  ALKPHOS 367*  BILITOT 5.6*   PT/INR No results for input(s): "LABPROT", "INR" in the last 72 hours. Hepatitis Panel No results for input(s): "HEPBSAG", "HCVAB", "HEPAIGM", "HEPBIGM" in the last 72 hours.    Studies/Results: CT ABDOMEN PELVIS W CONTRAST  Addendum Date: 09/03/2021   ADDENDUM REPORT: 09/03/2021 22:42 ADDENDUM: Please note the described inflammatory process at the cholecystectomy bed may be postsurgical. However, developing abscess or the possibility of by leak is not entirely excluded. If there is concern for biliary leak further evaluation with a hepatobiliary scintigraphy is recommended. No drainable fluid collection/abscess at this time. Electronically Signed   By: Anner Crete M.D.   On: 09/03/2021 22:42   Result Date: 09/03/2021 CLINICAL DATA:  Abdominal pain. Recent cholecystectomy and hernia repair. EXAM: CT ABDOMEN AND PELVIS WITH CONTRAST TECHNIQUE: Multidetector CT imaging of the abdomen and pelvis was performed using the standard protocol following bolus administration of intravenous contrast. RADIATION DOSE REDUCTION: This exam was performed according to the departmental dose-optimization program which includes automated exposure control, adjustment of the mA and/or kV according to patient size and/or use of iterative reconstruction technique. CONTRAST:  144m OMNIPAQUE IOHEXOL 300 MG/ML  SOLN COMPARISON:  CT abdomen pelvis dated 08/31/2021. FINDINGS: Lower chest: Mild eventration of the left diaphragm. There is bibasilar atelectasis or scarring. Background of emphysema. Small pockets of pneumoperitoneum in the upper abdomen, likely  postoperative. No large free air or free fluid. Hepatobiliary: Mild fatty liver. No intrahepatic biliary dilatation. Cholecystectomy. Inflammatory changes with small pockets of air at the cholecystectomy bed, likely postoperative. No drainable fluid collection or abscess. Pancreas: There is fatty infiltration of the pancreas. No active inflammatory changes. No dilatation of the main pancreatic duct. Spleen: Normal in size without focal abnormality. Adrenals/Urinary Tract: The right adrenal glands unremarkable. There is mild nodularity of the left adrenal  gland. Small right renal interpolar cyst. There is no hydronephrosis on either side. There is symmetric enhancement and excretion of contrast by both kidneys. The visualized ureters and the urinary bladder appear unremarkable. Stomach/Bowel: There is sigmoid diverticulosis without active inflammatory changes. Several duodenal diverticula noted. There is no bowel obstruction or active inflammation. The appendix is normal. Vascular/Lymphatic: Moderate aortoiliac atherosclerotic disease. The IVC is unremarkable. No portal venous gas. There is no adenopathy. Reproductive: Brachytherapy seeds noted the prostate gland. The seminal vesicles are symmetric. Other: There is infiltration of the umbilical fat and skin and subcutaneous soft tissues of the periumbilical region, likely related to postoperative hernia repair. No drainable fluid collection or abscess. No hernia. Musculoskeletal: Osteopenia with degenerative changes of the spine. No acute osseous pathology. IMPRESSION: 1. Status post cholecystectomy with inflammatory changes at the cholecystectomy bed, likely postoperative. No drainable fluid collection or abscess. 2. Postoperative umbilical hernia repair.  No fluid collection. 3. Sigmoid diverticulosis. No bowel obstruction. Normal appendix. 4. Aortic Atherosclerosis (ICD10-I70.0) and Emphysema (ICD10-J43.9). Electronically Signed: By: Anner Crete M.D. On:  09/03/2021 22:15    IMPRESSION/PLAN:  38) 71 year old male status post laparoscopic cholecystectomy by Dr. Georgette Dover for acute cholecystitis 08/31/2021, intraoperative cholangiogram was attempted but could not be successfully completed during surgery.  He was readmitted to the hospital 09/03/2021 with worsening RUQ with abdominal distention and elevated LFTs concerning for choledocholithiasis. WBC 9.8. T. Bili 5.6.  Alk phos 367.  AST 59.  ALT 415.  Lipase 17.  CTAP without evidence of intrahepatic biliary ductal dilatation but showed inflammatory changes with small pockets of air at the cholecystectomy bed without a drainable fluid collection or abscess. On empiric antibiotics per general surgery.  -NPO -Continue IV fluids at 100 cc/hour -Await abdominal MRI/MRCP results -ERCP if MRI/MRCP identifies choledocholithiasis -Ondansetron 4 mg po or  IV every 6 hours as needed -Pain management per the hospitalist -Add direct bilirubin level to earlier a.m. lab draw -BMP, hepatic panel, CBC and INR in a.m. -Further recommendations per Dr. Tarri Glenn   2) History of lymphocytic colitis, treated with budesonide, Lomotil or Imodium as needed.  Recently constipated, he passed a large loose bowel movement today after taking a stool softener, Milk of Magnesia and a Dulcolax suppository on 6/15.   3) History of GERD -Pantoprazole 40 mg p.o. daily  4) History of 2 tubular adenomatous polyps removed from the colon per colonoscopy 04/2018 -Next colonoscopy due 04/2025  5) DM II  Noralyn Pick  09/04/2021, 09:55AM

## 2021-09-04 NOTE — Progress Notes (Addendum)
[facility] PROGRESS NOTE  Patient: Frederick Guzman MVE:[id number]   PCP: Luetta Nutting, DO DOB: August 30, 1950   DOA: 09/03/2021   DOS: 09/04/2021    Subjective: Reports severe abdominal pain primarily located in the right upper quadrant area as well as periumbilical site.  No nausea no vomiting.  Passing gas.  1 BM this morning.  No fever no chills.  Objective:  Vitals:   09/04/21 1007 09/04/21 1553  BP: 139/66 139/74  Pulse: 83 74  Resp: 18 16  Temp: 98 F (36.7 C)   SpO2: 96% 95%    S1-S2 present. Bowel sound present.  Erythematous appearance around the umbilical trocar insertion site and mild swelling on the right upper quadrant area with severe tenderness in the upper abdomen with voluntary guarding.  No rigidity. No edema. No other rash. Clear to auscultation.  Bilateral breath sounds present.  Reviewed CBC and CMP. Discussed with general surgery.  Assessment and plan: Possible postop bile leak. Recent cholecystectomy on 08/31/2021. Currently being admitted under general surgery service. Management per GS and GI. Agree with removal of ceftriaxone ordered by my partner and transition to IV Zosyn for now.  Controlled, type 2 diabetes mellitus without long-term insulin use with hyperlipidemia. Currently transition to every 4 hours very sensitive sliding scale insulin.  Essential hypertension. Blood pressure elevated most likely in response to pain. Continue lisinopril.  BPH. On Flomax.  Recommend to continue.  Mood disorder. Continue Wellbutrin.  Patient has concerns with regarding cost while getting his regular medication regimen in the hospital. Recommended patient that his medication regimen has been reduced significantly and what he is on currently right now should be continued to ensure safe hospital stay.   Author: Berle Mull, MD [facility] 09/04/2021 5:36 PM   If 7PM-7AM, please contact night-coverage at [email or web address]

## 2021-09-04 NOTE — Progress Notes (Signed)
Progress Note     Subjective: Pt had a large loose BM this AM. Still having RUQ pain worst at most lateral incision.   Objective: Vital signs in last 24 hours: Temp:  [99.1 F (37.3 C)-99.2 F (37.3 C)] 99.1 F (37.3 C) (06/16 0142) Pulse Rate:  [102-118] 110 (06/16 0142) Resp:  [17-19] 18 (06/16 0142) BP: (130-173)/(66-87) 152/68 (06/16 0142) SpO2:  [90 %-100 %] 92 % (06/16 0142) Weight:  [90.7 kg] 90.7 kg (06/15 1939) Last BM Date : 08/29/21  Intake/Output from previous day: 06/15 0701 - 06/16 0700 In: 478 [I.V.:337; IV Piggyback:141] Out: -  Intake/Output this shift: No intake/output data recorded.  PE: General: pleasant, WD, WN male who is sitting up in NAD HEENT: sclera mildly icteric Heart: regular, rate, and rhythm.   Lungs: Respiratory effort nonlabored Abd: soft, ttp in RUQ, incisions C/D/I Psych: A&Ox3 with an appropriate affect.    Lab Results:  Recent Labs    09/03/21 2015  WBC 9.8  HGB 14.1  HCT 41.1  PLT 253   BMET Recent Labs    09/03/21 2015  NA 137  K 3.9  CL 99  CO2 27  GLUCOSE 164*  BUN 11  CREATININE 1.10  CALCIUM 8.7*   PT/INR No results for input(s): "LABPROT", "INR" in the last 72 hours. CMP     Component Value Date/Time   NA 137 09/03/2021 2015   K 3.9 09/03/2021 2015   CL 99 09/03/2021 2015   CO2 27 09/03/2021 2015   GLUCOSE 164 (H) 09/03/2021 2015   BUN 11 09/03/2021 2015   CREATININE 1.10 09/03/2021 2015   CREATININE 1.05 03/31/2021 0000   CALCIUM 8.7 (L) 09/03/2021 2015   PROT 7.2 09/03/2021 2015   ALBUMIN 3.1 (L) 09/03/2021 2015   AST 359 (H) 09/03/2021 2015   ALT 415 (H) 09/03/2021 2015   ALKPHOS 367 (H) 09/03/2021 2015   BILITOT 5.6 (H) 09/03/2021 2015   GFRNONAA >60 09/03/2021 2015   GFRNONAA 68 09/15/2020 0000   GFRAA 79 09/15/2020 0000   Lipase     Component Value Date/Time   LIPASE 17 09/03/2021 2015       Studies/Results: CT ABDOMEN PELVIS W CONTRAST  Addendum Date: 09/03/2021    ADDENDUM REPORT: 09/03/2021 22:42 ADDENDUM: Please note the described inflammatory process at the cholecystectomy bed may be postsurgical. However, developing abscess or the possibility of by leak is not entirely excluded. If there is concern for biliary leak further evaluation with a hepatobiliary scintigraphy is recommended. No drainable fluid collection/abscess at this time. Electronically Signed   By: Anner Crete M.D.   On: 09/03/2021 22:42   Result Date: 09/03/2021 CLINICAL DATA:  Abdominal pain. Recent cholecystectomy and hernia repair. EXAM: CT ABDOMEN AND PELVIS WITH CONTRAST TECHNIQUE: Multidetector CT imaging of the abdomen and pelvis was performed using the standard protocol following bolus administration of intravenous contrast. RADIATION DOSE REDUCTION: This exam was performed according to the departmental dose-optimization program which includes automated exposure control, adjustment of the mA and/or kV according to patient size and/or use of iterative reconstruction technique. CONTRAST:  118m OMNIPAQUE IOHEXOL 300 MG/ML  SOLN COMPARISON:  CT abdomen pelvis dated 08/31/2021. FINDINGS: Lower chest: Mild eventration of the left diaphragm. There is bibasilar atelectasis or scarring. Background of emphysema. Small pockets of pneumoperitoneum in the upper abdomen, likely postoperative. No large free air or free fluid. Hepatobiliary: Mild fatty liver. No intrahepatic biliary dilatation. Cholecystectomy. Inflammatory changes with small pockets of air at the cholecystectomy  bed, likely postoperative. No drainable fluid collection or abscess. Pancreas: There is fatty infiltration of the pancreas. No active inflammatory changes. No dilatation of the main pancreatic duct. Spleen: Normal in size without focal abnormality. Adrenals/Urinary Tract: The right adrenal glands unremarkable. There is mild nodularity of the left adrenal gland. Small right renal interpolar cyst. There is no hydronephrosis on  either side. There is symmetric enhancement and excretion of contrast by both kidneys. The visualized ureters and the urinary bladder appear unremarkable. Stomach/Bowel: There is sigmoid diverticulosis without active inflammatory changes. Several duodenal diverticula noted. There is no bowel obstruction or active inflammation. The appendix is normal. Vascular/Lymphatic: Moderate aortoiliac atherosclerotic disease. The IVC is unremarkable. No portal venous gas. There is no adenopathy. Reproductive: Brachytherapy seeds noted the prostate gland. The seminal vesicles are symmetric. Other: There is infiltration of the umbilical fat and skin and subcutaneous soft tissues of the periumbilical region, likely related to postoperative hernia repair. No drainable fluid collection or abscess. No hernia. Musculoskeletal: Osteopenia with degenerative changes of the spine. No acute osseous pathology. IMPRESSION: 1. Status post cholecystectomy with inflammatory changes at the cholecystectomy bed, likely postoperative. No drainable fluid collection or abscess. 2. Postoperative umbilical hernia repair.  No fluid collection. 3. Sigmoid diverticulosis. No bowel obstruction. Normal appendix. 4. Aortic Atherosclerosis (ICD10-I70.0) and Emphysema (ICD10-J43.9). Electronically Signed: By: Anner Crete M.D. On: 09/03/2021 22:15    Anti-infectives: Anti-infectives (From admission, onward)    Start     Dose/Rate Route Frequency Ordered Stop   09/04/21 0200  piperacillin-tazobactam (ZOSYN) IVPB 3.375 g        3.375 g 12.5 mL/hr over 240 Minutes Intravenous Every 8 hours 09/04/21 0124     09/04/21 0015  cefTRIAXone (ROCEPHIN) 2 g in sodium chloride 0.9 % 100 mL IVPB        2 g 200 mL/hr over 30 Minutes Intravenous Every 24 hours 09/04/21 0002 09/11/21 0014        Assessment/Plan Acute cholecystitis  S/P lap cholecystectomy 08/31/21 Dr. Georgette Dover Hyperbilirubinemia and transaminitis  - CT yesterday without drainable fluid  collection or abscess - Tbili up to 5.6 yesterday from 1.1 on POD1, AST/ALT elevated at 359/415, Alk phos 367 - MRCP ordered and will consult GI this AM due to concern for retained CBD stone, IOC attempted during surgery but was unable to be done   FEN: NPO, IVF'@125'  cc/h VTE: SCDs ID: Zosyn 6/16>>  LOS: 1 day     Norm Parcel, [facility] 09/04/2021, 8:16 AM Please see Amion for pager number during day hours 7:00am-4:30pm

## 2021-09-04 NOTE — Progress Notes (Signed)
Pharmacy Antibiotic Note  Frederick Guzman is a 71 y.o. male admitted on 09/03/2021 with medical history significant of diabetes, asthma, COPD, hypertension, GERD, hyperlipidemia, depression with anxiety, who was admitted and had laparoscopic cholecystectomy 3 days ago.  At the time he had acute cholecystitis.  During the procedure cholangiogram apparently was attempted but was not successfully completed.  Patient was discharged home 2 days ago.  Returned today with abdominal pain nausea with vomiting.  Pharmacy has been consulted for zsoyn dosing.  Plan: Zosyn 3.375g IV Q8H infused over 4hrs. Follow renal function, cultures and clinical course  Height: 6' (182.9 cm) Weight: 90.7 kg (199 lb 15.3 oz) IBW/kg (Calculated) : 77.6  Temp (24hrs), Avg:99.2 F (37.3 C), Min:99.2 F (37.3 C), Max:99.2 F (37.3 C)  Recent Labs  Lab 08/31/21 0947 08/31/21 1000 09/01/21 0414 09/03/21 2015  WBC 13.8*  --  14.1* 9.8  CREATININE 1.12  --  1.08 1.10  LATICACIDVEN  --  1.4  --   --     Estimated Creatinine Clearance: 66.6 mL/min (by C-G formula based on SCr of 1.1 mg/dL).    Allergies  Allergen Reactions   Methotrexate Derivatives Nausea And Vomiting and Other (See Comments)    Hallucinations and aggressive behavior     Tramadol Nausea And Vomiting    Hallucinations and aggressive behavior      Thank you for allowing pharmacy to be a part of this patient's care.  Dolly Rias RPh 09/04/2021, 1:26 AM

## 2021-09-05 DIAGNOSIS — R748 Abnormal levels of other serum enzymes: Secondary | ICD-10-CM | POA: Diagnosis not present

## 2021-09-05 DIAGNOSIS — R1011 Right upper quadrant pain: Secondary | ICD-10-CM | POA: Diagnosis not present

## 2021-09-05 LAB — CBC
HCT: 40.2 % (ref 39.0–52.0)
Hemoglobin: 13.3 g/dL (ref 13.0–17.0)
MCH: 31.4 pg (ref 26.0–34.0)
MCHC: 33.1 g/dL (ref 30.0–36.0)
MCV: 95 fL (ref 80.0–100.0)
Platelets: 218 10*3/uL (ref 150–400)
RBC: 4.23 MIL/uL (ref 4.22–5.81)
RDW: 13.7 % (ref 11.5–15.5)
WBC: 8.8 10*3/uL (ref 4.0–10.5)
nRBC: 0 % (ref 0.0–0.2)

## 2021-09-05 LAB — COMPREHENSIVE METABOLIC PANEL
ALT: 284 U/L — ABNORMAL HIGH (ref 0–44)
AST: 146 U/L — ABNORMAL HIGH (ref 15–41)
Albumin: 2.7 g/dL — ABNORMAL LOW (ref 3.5–5.0)
Alkaline Phosphatase: 288 U/L — ABNORMAL HIGH (ref 38–126)
Anion gap: 7 (ref 5–15)
BUN: 10 mg/dL (ref 8–23)
CO2: 26 mmol/L (ref 22–32)
Calcium: 8.3 mg/dL — ABNORMAL LOW (ref 8.9–10.3)
Chloride: 102 mmol/L (ref 98–111)
Creatinine, Ser: 0.95 mg/dL (ref 0.61–1.24)
GFR, Estimated: >60 mL/min (ref >60)
Glucose, Bld: 139 mg/dL — ABNORMAL HIGH (ref 70–99)
Potassium: 4.2 mmol/L (ref 3.5–5.1)
Sodium: 135 mmol/L (ref 135–145)
Total Bilirubin: 2.1 mg/dL — ABNORMAL HIGH (ref 0.3–1.2)
Total Protein: 6.1 g/dL — ABNORMAL LOW (ref 6.5–8.1)

## 2021-09-05 LAB — GLUCOSE, CAPILLARY
Glucose-Capillary: 131 mg/dL — ABNORMAL HIGH (ref 70–99)
Glucose-Capillary: 138 mg/dL — ABNORMAL HIGH (ref 70–99)
Glucose-Capillary: 254 mg/dL — ABNORMAL HIGH (ref 70–99)
Glucose-Capillary: 137 mg/dL — ABNORMAL HIGH (ref 70–99)

## 2021-09-05 MED ORDER — METHOCARBAMOL 500 MG PO TABS: 500.0000 mg | ORAL_TABLET | Freq: Four times a day (QID) | ORAL | 0 refills | Status: DC | PRN

## 2021-09-05 MED ORDER — METHOCARBAMOL 500 MG PO TABS: 500.0000 mg | ORAL_TABLET | Freq: Four times a day (QID) | ORAL | Status: DC | PRN

## 2021-09-05 NOTE — Discharge Summary (Signed)
Physician Discharge Summary  Patient ID: JARY LOUVIER MRN: [id number] DOB/AGE: 1950-08-08 71 y.o.  Admit date: 09/03/2021 Discharge date: 09/06/2021  Admission Diagnoses:  Abdominal pain    Jaundice  Discharge Diagnoses:  Improving abdominal pain and jaundice Principal Problem:   Elevated liver enzymes Active Problems:   OBSTRUCTIVE SLEEP APNEA   BRONCHITIS, CHRONIC   GERD (gastroesophageal reflux disease)   HTN (hypertension)   Uncontrolled type 2 diabetes mellitus with hyperglycemia, without long-term current use of insulin (HCC)   Hyperlipidemia   BPH (benign prostatic hyperplasia)   Abdominal pain, right upper quadrant   S/P laparoscopic cholecystectomy   Transaminitis   Biliary obstruction   Generalized abdominal pain   Discharged Condition: good  Hospital Course: He was readmitted on the night of 09/03/2021 with severe abdominal pain.  He was noted to have a total bilirubin of 5.6, although he had a normal bilirubin of 1.0 on the date of discharge earlier this week.  CT scan showed no large fluid collections.  We were concerned for possible choledocholithiasis as we were unable to obtain a cholangiogram at the time of his surgery.  However MRCP showed no biliary ductal dilatation and no sign of choledocholithiasis.  Subsequent HIDA scan was obtained to rule out a leak.  The HIDA scan showed no sign of leak or biliary obstruction.  The patient's symptoms have improved.  His total bilirubin is down to 2.1 today.  He may have had some debris or small stones in the common bile duct that have now passed.  His diet is being advanced.  We would like to see his liver function test returned to normal but the patient is quite insistent that he wants to go home later today.  We will recheck him after lunch to see if he is ready to go.  Robaxin has been added to his pain regimen to help with the muscle spasms in the abdomen.  He already has a prescription for pain medication at his  pharmacy from earlier in the week.  Addendum:  He felt better after tolerating lunch.  Ready for discharge.  Consults: GI  Significant Diagnostic Studies:  MRCP and HIDA scan as described above    Discharge Exam: Blood pressure (!) 123/101, pulse 94, temperature 98.5 F (36.9 C), temperature source Oral, resp. rate 18, height 6' (1.829 m), weight 90.7 kg, SpO2 97 %. General appearance: alert, cooperative, and no distress GI: Soft, mild incisional tenderness Incisions c/d/I; mild ecchymosis around the umbilicus    Disposition:  Discharge disposition: 01-Home or Self Care       Discharge Instructions     Diet - low sodium heart healthy   Complete by: As directed    Increase activity slowly   Complete by: As directed    No wound care   Complete by: As directed       Allergies as of 09/05/2021       Reactions   Methotrexate Derivatives Nausea And Vomiting, Other (See Comments)   Hallucinations and aggressive behavior    Tramadol Nausea And Vomiting   Hallucinations and aggressive behavior         Medication List     TAKE these medications    acetaminophen 500 MG tablet Commonly known as: TYLENOL Take 2 tablets (1,000 mg total) by mouth every 6 (six) hours as needed for mild pain or fever.   albuterol 108 (90 Base) MCG/ACT inhaler Commonly known as: VENTOLIN HFA Inhale 2 puffs into the lungs every 6 (  six) hours as needed for wheezing or shortness of breath.   albuterol (2.5 MG/3ML) 0.083% nebulizer solution Commonly known as: PROVENTIL Take 2.5 mg by nebulization every 6 (six) hours as needed for wheezing or shortness of breath.   atorvastatin 20 MG tablet Commonly known as: LIPITOR Take 1 tablet (20 mg total) by mouth every other day.   b complex vitamins tablet Take 1 tablet by mouth daily.   Breztri Aerosphere 160-9-4.8 MCG/ACT Aero Generic drug: Budeson-Glycopyrrol-Formoterol Inhale 1 puff into the lungs daily.   buPROPion 150 MG 24 hr  tablet Commonly known as: Wellbutrin XL Take 1 tablet (150 mg total) by mouth daily.   cetirizine 10 MG tablet Commonly known as: ZYRTEC Take 10 mg by mouth daily as needed for allergies.   docusate sodium 100 MG capsule Commonly known as: COLACE Take 1 capsule (100 mg total) by mouth daily as needed for mild constipation.   esomeprazole 40 MG capsule Commonly known as: NEXIUM Take 1 capsule (40 mg total) by mouth daily. NO REFILLS. NEEDS TO TRANSITION CARE TO NEW PCP. What changed: additional instructions   Fish Oil 1000 MG Caps Take 1,000 mg by mouth at bedtime.   fluticasone 50 MCG/ACT nasal spray Commonly known as: FLONASE Place 1 spray into both nostrils daily as needed for allergies.   furosemide 20 MG tablet Commonly known as: LASIX Take 1 tablet (20 mg total) by mouth 2 (two) times daily as needed for edema. What changed: when to take this   Insulin Pen Needle 32G X 4 MM Misc For use with Lantus Solostar What changed: additional instructions   Lantus SoloStar 100 UNIT/ML Solostar Pen Generic drug: insulin glargine Inject 60 Units into the skin at bedtime.   lisinopril 2.5 MG tablet Commonly known as: ZESTRIL Take 1 tablet (2.5 mg total) by mouth at bedtime.   methocarbamol 500 MG tablet Commonly known as: ROBAXIN Take 1 tablet (500 mg total) by mouth every 6 (six) hours as needed for muscle spasms.   multivitamin with minerals Tabs tablet Take 1 tablet by mouth at bedtime.   oxyCODONE 5 MG immediate release tablet Commonly known as: Oxy IR/ROXICODONE Take 1 tablet (5 mg total) by mouth every 6 (six) hours as needed for moderate pain.   Prodigy No Coding Blood Gluc test strip Generic drug: glucose blood Use up to 4 times per day as directed with glucometer.   REFRESH OP Place 1 drop into both eyes daily as needed (dry eyes).   tacrolimus 0.1 % ointment Commonly known as: PROTOPIC Apply 1 application topically daily.   tadalafil 5 MG  tablet Commonly known as: CIALIS Take 5 mg by mouth daily.   tamsulosin 0.4 MG Caps capsule Commonly known as: FLOMAX Take 0.4 mg by mouth at bedtime.   Testosterone 30 MG/ACT Soln APPLY 1-2 PUMPS DAILY. ALTERNATE TAKING ONE PUMP UNDER ONE ARM ONE DAY, TWO PUMPS THE NEXT DAY (ONE PUMP UNDER EACH ARM) What changed: See the new instructions.   vitamin C 500 MG tablet Commonly known as: ASCORBIC ACID Take 500 mg by mouth daily.        Follow-up Information     Maczis, Carlena Hurl, PA-C Follow up in 2 week(s).   Specialty: General Surgery Why: Keep appointment on 09/17/21 as scheduled Contact information: [place] [address] [phone]                 Signed: Maia Petties 09/06/2021, 7:02  AM

## 2021-09-05 NOTE — Progress Notes (Signed)
Subjective/Chief Complaint: The patient feels better today.  Still with some right upper quadrant soreness but it seems to feel more like muscle spasms.  He is tolerating clear liquids.  Total bilirubin has decreased to 2.1.     Objective: Vital signs in last 24 hours: Temp:  [97.9 F (36.6 C)-98.1 F (36.7 C)] 97.9 F (36.6 C) (06/17 0621) Pulse Rate:  [74-83] 82 (06/17 0621) Resp:  [13-18] 16 (06/17 0621) BP: (124-168)/(58-76) 168/76 (06/17 0621) SpO2:  [93 %-96 %] 96 % (06/17 0621) Last BM Date : 09/04/21  Intake/Output from previous day: 06/16 0701 - 06/17 0700 In: 876.3 [P.O.:360; I.V.:457.3; IV Piggyback:59] Out: 1250 [Urine:1250] Intake/Output this shift: No intake/output data recorded.  General appearance: alert, cooperative, and no distress GI: Soft, mild incisional tenderness Incisions c/d/I; mild ecchymosis around the umbilicus  Lab Results:  Recent Labs    09/04/21 1018 09/05/21 0447  WBC 7.6 8.8  HGB 13.2 13.3  HCT 38.9* 40.2  PLT 225 218   BMET Recent Labs    09/04/21 1018 09/05/21 0447  NA 136 135  K 3.9 4.2  CL 101 102  CO2 28 26  GLUCOSE 123* 139*  BUN 12 10  CREATININE 0.97 0.95  CALCIUM 8.3* 8.3*       Latest Ref Rng & Units 09/05/2021    4:47 AM 09/04/2021   10:18 AM 09/03/2021    8:15 PM  Hepatic Function  Total Protein 6.5 - 8.1 g/dL 6.1  6.6  7.2   Albumin 3.5 - 5.0 g/dL 2.7  2.8  3.1   AST 15 - 41 U/L 146  271  359   ALT 0 - 44 U/L 284  368  415   Alk Phosphatase 38 - 126 U/L 288  335  367   Total Bilirubin 0.3 - 1.2 mg/dL 2.1  5.6  5.6   Bilirubin, Direct 0.0 - 0.2 mg/dL  4.0       Studies/Results: NM HEPATOBILIARY LEAK (POST-SURGICAL)  Result Date: 09/04/2021 CLINICAL DATA:  Recurrent right upper quadrant abdominal pain and elevated liver function studies status post cholecystectomy 08/31/2021. EXAM: NUCLEAR MEDICINE HEPATOBILIARY IMAGING TECHNIQUE: Sequential images of the abdomen were obtained out to 60 minutes  following intravenous administration of radiopharmaceutical. RADIOPHARMACEUTICALS:  7.7 mCi Tc-34m Choletec IV COMPARISON:  Abdominal MRI 09/04/2021. Abdominopelvic CT 09/03/2021. FINDINGS: Prompt uptake and biliary excretion of activity by the liver is seen. There is normal passage of activity into the small bowel. There is no abnormal accumulation of activity beyond the biliary system or small bowel. Specifically, no evidence of bile leak. IMPRESSION: Unremarkable hepatobiliary scan status post recent cholecystectomy. No evidence of biliary obstruction or leak. Electronically Signed   By: WRichardean SaleM.D.   On: 09/04/2021 15:34   MR 3D Recon At Scanner  Result Date: 09/04/2021 CLINICAL DATA:  Abdominal pain. Jaundice. Recent cholecystectomy and hernia repair. EXAM: MRI ABDOMEN WITHOUT CONTRAST  (INCLUDING MRCP) TECHNIQUE: Multiplanar multisequence MR imaging of the abdomen was performed. Heavily T2-weighted images of the biliary and pancreatic ducts were obtained, and three-dimensional MRCP images were rendered by post processing. Patient could not complete the entire exam and IV contrast could not be administered. COMPARISON:  CT on 09/03/2021 FINDINGS: Lower chest: Mild right lower lobe atelectasis. Hepatobiliary: No masses visualized on this unenhanced exam. Recent cholecystectomy noted. A postoperative fluid collection is seen within the gallbladder fossa measuring 5.2 x 2.8 cm, and postop bile leak cannot be excluded. No evidence of biliary ductal dilatation  or choledocholithiasis. Pancreas: No mass or inflammatory process visualized on this unenhanced exam. No evidence of pancreatic ductal dilatation or pancreas divisum. Spleen:  Within normal limits in size. Adrenals/Urinary tract: Small benign-appearing right renal cyst noted. No evidence of renal mass or hydronephrosis. Stomach/Bowel: Unremarkable. Vascular/Lymphatic: No pathologically enlarged lymph nodes identified. No evidence of abdominal  aortic aneurysm. Other:  None. Musculoskeletal:  No suspicious bone lesions identified. IMPRESSION: Recent cholecystectomy. 5.2 x 2.8 cm postoperative fluid collection within the gallbladder fossa. Postop bile leak cannot be excluded. Consider nuclear medicine hepatobiliary scan for further evaluation. No evidence of biliary ductal dilatation or choledocholithiasis. Electronically Signed   By: Marlaine Hind M.D.   On: 09/04/2021 11:46   MR ABDOMEN MRCP WO CONTRAST  Result Date: 09/04/2021 CLINICAL DATA:  Abdominal pain. Jaundice. Recent cholecystectomy and hernia repair. EXAM: MRI ABDOMEN WITHOUT CONTRAST  (INCLUDING MRCP) TECHNIQUE: Multiplanar multisequence MR imaging of the abdomen was performed. Heavily T2-weighted images of the biliary and pancreatic ducts were obtained, and three-dimensional MRCP images were rendered by post processing. Patient could not complete the entire exam and IV contrast could not be administered. COMPARISON:  CT on 09/03/2021 FINDINGS: Lower chest: Mild right lower lobe atelectasis. Hepatobiliary: No masses visualized on this unenhanced exam. Recent cholecystectomy noted. A postoperative fluid collection is seen within the gallbladder fossa measuring 5.2 x 2.8 cm, and postop bile leak cannot be excluded. No evidence of biliary ductal dilatation or choledocholithiasis. Pancreas: No mass or inflammatory process visualized on this unenhanced exam. No evidence of pancreatic ductal dilatation or pancreas divisum. Spleen:  Within normal limits in size. Adrenals/Urinary tract: Small benign-appearing right renal cyst noted. No evidence of renal mass or hydronephrosis. Stomach/Bowel: Unremarkable. Vascular/Lymphatic: No pathologically enlarged lymph nodes identified. No evidence of abdominal aortic aneurysm. Other:  None. Musculoskeletal:  No suspicious bone lesions identified. IMPRESSION: Recent cholecystectomy. 5.2 x 2.8 cm postoperative fluid collection within the gallbladder fossa.  Postop bile leak cannot be excluded. Consider nuclear medicine hepatobiliary scan for further evaluation. No evidence of biliary ductal dilatation or choledocholithiasis. Electronically Signed   By: Marlaine Hind M.D.   On: 09/04/2021 11:46   CT ABDOMEN PELVIS W CONTRAST  Addendum Date: 09/03/2021   ADDENDUM REPORT: 09/03/2021 22:42 ADDENDUM: Please note the described inflammatory process at the cholecystectomy bed may be postsurgical. However, developing abscess or the possibility of by leak is not entirely excluded. If there is concern for biliary leak further evaluation with a hepatobiliary scintigraphy is recommended. No drainable fluid collection/abscess at this time. Electronically Signed   By: Anner Crete M.D.   On: 09/03/2021 22:42   Result Date: 09/03/2021 CLINICAL DATA:  Abdominal pain. Recent cholecystectomy and hernia repair. EXAM: CT ABDOMEN AND PELVIS WITH CONTRAST TECHNIQUE: Multidetector CT imaging of the abdomen and pelvis was performed using the standard protocol following bolus administration of intravenous contrast. RADIATION DOSE REDUCTION: This exam was performed according to the departmental dose-optimization program which includes automated exposure control, adjustment of the mA and/or kV according to patient size and/or use of iterative reconstruction technique. CONTRAST:  18m OMNIPAQUE IOHEXOL 300 MG/ML  SOLN COMPARISON:  CT abdomen pelvis dated 08/31/2021. FINDINGS: Lower chest: Mild eventration of the left diaphragm. There is bibasilar atelectasis or scarring. Background of emphysema. Small pockets of pneumoperitoneum in the upper abdomen, likely postoperative. No large free air or free fluid. Hepatobiliary: Mild fatty liver. No intrahepatic biliary dilatation. Cholecystectomy. Inflammatory changes with small pockets of air at the cholecystectomy bed, likely postoperative. No drainable fluid collection  or abscess. Pancreas: There is fatty infiltration of the pancreas. No  active inflammatory changes. No dilatation of the main pancreatic duct. Spleen: Normal in size without focal abnormality. Adrenals/Urinary Tract: The right adrenal glands unremarkable. There is mild nodularity of the left adrenal gland. Small right renal interpolar cyst. There is no hydronephrosis on either side. There is symmetric enhancement and excretion of contrast by both kidneys. The visualized ureters and the urinary bladder appear unremarkable. Stomach/Bowel: There is sigmoid diverticulosis without active inflammatory changes. Several duodenal diverticula noted. There is no bowel obstruction or active inflammation. The appendix is normal. Vascular/Lymphatic: Moderate aortoiliac atherosclerotic disease. The IVC is unremarkable. No portal venous gas. There is no adenopathy. Reproductive: Brachytherapy seeds noted the prostate gland. The seminal vesicles are symmetric. Other: There is infiltration of the umbilical fat and skin and subcutaneous soft tissues of the periumbilical region, likely related to postoperative hernia repair. No drainable fluid collection or abscess. No hernia. Musculoskeletal: Osteopenia with degenerative changes of the spine. No acute osseous pathology. IMPRESSION: 1. Status post cholecystectomy with inflammatory changes at the cholecystectomy bed, likely postoperative. No drainable fluid collection or abscess. 2. Postoperative umbilical hernia repair.  No fluid collection. 3. Sigmoid diverticulosis. No bowel obstruction. Normal appendix. 4. Aortic Atherosclerosis (ICD10-I70.0) and Emphysema (ICD10-J43.9). Electronically Signed: By: Anner Crete M.D. On: 09/03/2021 22:15    Anti-infectives: Anti-infectives (From admission, onward)    Start     Dose/Rate Route Frequency Ordered Stop   09/04/21 0200  piperacillin-tazobactam (ZOSYN) IVPB 3.375 g  Status:  Discontinued        3.375 g 12.5 mL/hr over 240 Minutes Intravenous Every 8 hours 09/04/21 0124 09/05/21 0744   09/04/21  0015  cefTRIAXone (ROCEPHIN) 2 g in sodium chloride 0.9 % 100 mL IVPB  Status:  Discontinued        2 g 200 mL/hr over 30 Minutes Intravenous Every 24 hours 09/04/21 0002 09/04/21 0819       Assessment/Plan: Acute cholecystitis  S/P lap cholecystectomy 08/31/21 Dr. Georgette Dover Hyperbilirubinemia and transaminitis  - CT 6/15 without drainable fluid collection or abscess - Tbili up to 5.6 yesterday from 1.1 on POD1, AST/ALT elevated at 359/415, Alk phos 367 -MRCP showed no biliary ductal dilatation and no choledocholithiasis. -HIDA scan showed no sign of leak or obstruction -Total bilirubin down to 2.1 -These findings are consistent with possible choledocholithiasis or debris in the common bile duct that has now passed spontaneously without need for intervention.  The patient continues to improve.  We will advance his diet.  We will add Robaxin for the muscle spasms.  He already has a prescription for pain medication at his pharmacy that was prescribed earlier in the week.  Possible discharge home later this afternoon if he is tolerating a diet, per the patient's request.  VTE: SCDs ID: [email or web address] 6/16>>  LOS: 2 days    Frederick Guzman 09/05/2021

## 2021-09-05 NOTE — Plan of Care (Signed)
  Problem: Education: Goal: Ability to describe self-care measures that may prevent or decrease complications (Diabetes Survival Skills Education) will improve Outcome: Progressing Goal: Individualized Educational Video(s) Outcome: Progressing   

## 2021-09-05 NOTE — Progress Notes (Signed)
[facility]  Patient: Frederick Guzman QKS:[id number]   PCP: Luetta Nutting, DO DOB: [birth year]   DOA: 09/03/2021   DOS: 09/05/2021    Patient's chart was screened.  Deferring to GI and general surgery. Safe to discharge from medicine point of view with close follow-up with PCP.  Author: Berle Mull, MD [facility] 09/05/2021   If 7PM-7AM, please contact night-coverage at [email or web address]

## 2021-09-05 NOTE — Progress Notes (Signed)
     [facility] Progress Note  CC:  Abdominal pain  Assessment / Plan: Severe pain and abnormal liver enzymes after lap chole 08/31/21 for acute cholecystitis. No evidence for retained stone on MRI/MRCP and no bile leak. I suspect that he passed a common bile duct. Liver enzymes are improving. There may also be some neuropathic pain at the incision site. Perhaps he would be a candidate for a Elavil or gabapentin as he would like to avoid narcotics.  Diet per surgery.   No additional inpatient GI recommendations. Please call the on-call gastroenterologist with any additional questions or concerns during this hospitalization.   Subjective: Continues to have abdominal pain and a sensation of spasm at the incision sites. Pain is overall unchanged from yesterday. Tolerating clear liquids. Eager to get home.   No family present at the time of my evaluation.   Objective:  Vital signs in last 24 hours: Temp:  [97.9 F (36.6 C)-98.1 F (36.7 C)] 97.9 F (36.6 C) (06/17 0621) Pulse Rate:  [74-83] 82 (06/17 0621) Resp:  [13-18] 16 (06/17 0621) BP: (124-168)/(58-76) 168/76 (06/17 0621) SpO2:  [93 %-96 %] 96 % (06/17 0621) Last BM Date : 09/04/21 General:   Alert, in NAD, scleral icterus has resolved Abdomen:  Soft. Diffusely tender at the incisions. Some guarding. No rebound. Normal bowel sounds. Extremities:  Without edema. Neurologic:  Alert and  oriented x4;  grossly normal neurologically. Psych:  Alert and cooperative. Normal mood and affect.   Lab Results: Recent Labs    09/03/21 2015 09/04/21 1018 09/05/21 0447  WBC 9.8 7.6 8.8  HGB 14.1 13.2 13.3  HCT 41.1 38.9* 40.2  PLT 253 225 218   BMET Recent Labs    09/03/21 2015 09/04/21 1018 09/05/21 0447  NA 137 136 135  K 3.9 3.9 4.2  CL 99 101 102  CO2 '27 28 26  '$ GLUCOSE 164* 123* 139*  BUN '11 12 10  '$ CREATININE 1.10 0.97 0.95  CALCIUM 8.7* 8.3* 8.3*   LFT Recent Labs    09/04/21 1018 09/05/21 0447   PROT 6.6 6.1*  ALBUMIN 2.8* 2.7*  AST 271* 146*  ALT 368* 284*  ALKPHOS 335* 288*  BILITOT 5.6* 2.1*  BILIDIR 4.0*  --    MRCP IMPRESSION: Recent cholecystectomy. 5.2 x 2.8 cm postoperative fluid collection within the gallbladder fossa. Postop bile leak cannot be excluded. No evidence of biliary ductal dilatation or choledocholithiasis.  HIDA IMPRESSION: Unremarkable hepatobiliary scan status post recent cholecystectomy. No evidence of biliary obstruction or leak.   LOS: 2 days   [place]  09/05/2021, 7:55 AM

## 2021-09-05 NOTE — Discharge Summary (Signed)
Patient ID: Frederick Guzman MRN: [id number] DOB/AGE: [birth year] 71 y.o.  Admit date: 09/03/2021 Discharge date: 09/05/2021  Discharge Diagnoses Patient Active Problem List   Diagnosis Date Noted   Biliary obstruction 09/04/2021   Generalized abdominal pain    Elevated liver enzymes 09/03/2021   Abdominal pain, right upper quadrant 09/03/2021   S/P laparoscopic cholecystectomy 09/03/2021   Transaminitis 09/03/2021   Acute cholecystitis 08/31/2021   COPD with exacerbation ([place]) 04/13/2021   Hyperlipidemia 08/07/2020   Combined forms of age-related cataract of right eye 09/21/2018   Testosterone deficiency in male 04/10/2018   Pulmonary nodules 10/23/2017   Sepsis, unspecified organism ([place]) 10/23/2017   Pain in right hand 08/04/2017   Long term use of drug 04/19/2017   Pain in joint of left shoulder 04/19/2017   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   Positive depression screening 09/21/2016   Uncontrolled type 2 diabetes mellitus with hyperglycemia, without long-term current use of insulin ([place]) 09/21/2016   Status post shoulder surgery 08/09/2016   Fibromyalgia 05/21/2016   S/P shoulder replacement, left 01/30/2016   Depression    HTN (hypertension)    BPH (benign prostatic hyperplasia) 06/17/2011   Hypogonadism male 06/17/2011   Arthritis of wrist 05/28/2011   MDD (major depressive disorder) ([place]) 04/30/2011   GERD (gastroesophageal reflux disease) 11/23/2010   DERMATITIS, ATOPIC 11/23/2010   COPD (chronic obstructive pulmonary disease) ([place]) 10/07/2010   TOBACCO ABUSE 03/10/2007   OBSTRUCTIVE SLEEP APNEA 03/10/2007   BRONCHITIS, CHRONIC 03/10/2007   ASTHMA, MODERATE 03/10/2007    Consultants Gastroenterology, Dr. Tarri Glenn  Procedures Laparoscopic cholecystectomy 08/31/21  Hospital Course: Admitted with hyperbilirubinemia and associated transaminitis. CT showed no drainable fluid collection or abscess. MRCP showed no evident persistent CBD stones. Labs improved and  bilirubin began to significantly down-trend. His symptoms improved to where it was solely incisional discomfort. He was seen by GI whom agreed with impression of spontaneously passed CBD stone. He was comfortable with and deemed stable for discharge home 09/05/21    Allergies as of 09/05/2021       Reactions   Methotrexate Derivatives Nausea And Vomiting, Other (See Comments)   Hallucinations and aggressive behavior    Tramadol Nausea And Vomiting   Hallucinations and aggressive behavior         Medication List     TAKE these medications    acetaminophen 500 MG tablet Commonly known as: TYLENOL Take 2 tablets (1,000 mg total) by mouth every 6 (six) hours as needed for mild pain or fever.   albuterol 108 (90 Base) MCG/ACT inhaler Commonly known as: VENTOLIN HFA Inhale 2 puffs into the lungs every 6 (six) hours as needed for wheezing or shortness of breath.   albuterol (2.5 MG/3ML) 0.083% nebulizer solution Commonly known as: PROVENTIL Take 2.5 mg by nebulization every 6 (six) hours as needed for wheezing or shortness of breath.   atorvastatin 20 MG tablet Commonly known as: LIPITOR Take 1 tablet (20 mg total) by mouth every other day.   b complex vitamins tablet Take 1 tablet by mouth daily.   Breztri Aerosphere 160-9-4.8 MCG/ACT Aero Generic drug: Budeson-Glycopyrrol-Formoterol Inhale 1 puff into the lungs daily.   buPROPion 150 MG 24 hr tablet Commonly known as: Wellbutrin XL Take 1 tablet (150 mg total) by mouth daily.   cetirizine 10 MG tablet Commonly known as: ZYRTEC Take 10 mg by mouth daily as needed for allergies.   docusate sodium 100 MG capsule Commonly known as: COLACE Take 1 capsule (100 mg total) by mouth daily  as needed for mild constipation.   esomeprazole 40 MG capsule Commonly known as: NEXIUM Take 1 capsule (40 mg total) by mouth daily. NO REFILLS. NEEDS TO TRANSITION CARE TO NEW PCP. What changed: additional instructions   Fish Oil 1000 MG  Caps Take 1,000 mg by mouth at bedtime.   fluticasone 50 MCG/ACT nasal spray Commonly known as: FLONASE Place 1 spray into both nostrils daily as needed for allergies.   furosemide 20 MG tablet Commonly known as: LASIX Take 1 tablet (20 mg total) by mouth 2 (two) times daily as needed for edema. What changed: when to take this   Insulin Pen Needle 32G X 4 MM Misc For use with Lantus Solostar What changed: additional instructions   Lantus SoloStar 100 UNIT/ML Solostar Pen Generic drug: insulin glargine Inject 60 Units into the skin at bedtime.   lisinopril 2.5 MG tablet Commonly known as: ZESTRIL Take 1 tablet (2.5 mg total) by mouth at bedtime.   methocarbamol 500 MG tablet Commonly known as: ROBAXIN Take 1 tablet (500 mg total) by mouth every 6 (six) hours as needed for muscle spasms.   multivitamin with minerals Tabs tablet Take 1 tablet by mouth at bedtime.   oxyCODONE 5 MG immediate release tablet Commonly known as: Oxy IR/ROXICODONE Take 1 tablet (5 mg total) by mouth every 6 (six) hours as needed for moderate pain.   Prodigy No Coding Blood Gluc test strip Generic drug: glucose blood Use up to 4 times per day as directed with glucometer.   REFRESH OP Place 1 drop into both eyes daily as needed (dry eyes).   tacrolimus 0.1 % ointment Commonly known as: PROTOPIC Apply 1 application topically daily.   tadalafil 5 MG tablet Commonly known as: CIALIS Take 5 mg by mouth daily.   tamsulosin 0.4 MG Caps capsule Commonly known as: FLOMAX Take 0.4 mg by mouth at bedtime.   Testosterone 30 MG/ACT Soln APPLY 1-2 PUMPS DAILY. ALTERNATE TAKING ONE PUMP UNDER ONE ARM ONE DAY, TWO PUMPS THE NEXT DAY (ONE PUMP UNDER EACH ARM) What changed: See the new instructions.   vitamin C 500 MG tablet Commonly known as: ASCORBIC ACID Take 500 mg by mouth daily.          Follow-up Information     Maczis, Carlena Hurl, PA-C Follow up in 2 week(s).   Specialty: General  Surgery Why: Keep appointment on 09/17/21 as scheduled Contact information: [place] [address] [phone]                 Raushanah Osmundson M. Dema Severin, M.D. [facility]

## 2021-09-05 NOTE — Discharge Instructions (Signed)
[facility] ______[facility] LAPAROSCOPIC SURGERY: POST OP INSTRUCTIONS Always review your discharge instruction sheet given to you by the facility where your surgery was performed. IF YOU HAVE DISABILITY OR FAMILY LEAVE FORMS, YOU MUST BRING THEM TO THE OFFICE FOR PROCESSING.   DO NOT GIVE THEM TO YOUR DOCTOR.  A prescription for pain medication may be given to you upon discharge.  Take your pain medication as prescribed, if needed.  If narcotic pain medicine is not needed, then you may take acetaminophen (Tylenol) or ibuprofen (Advil) as needed. Take your usually prescribed medications unless otherwise directed. If you need a refill on your pain medication, please contact your pharmacy.  They will contact our office to request authorization. Prescriptions will not be filled after 5pm or on week-ends. You should follow a light diet the first few days after arrival home, such as soup and crackers, etc.  Be sure to include lots of fluids daily. Most patients will experience some swelling and bruising in the area of the incisions.  Ice packs will help.  Swelling and bruising can take several days to resolve.  It is common to experience some constipation if taking pain medication after surgery.  Increasing fluid intake and taking a stool softener (such as Colace) will usually help or prevent this problem from occurring.  A mild laxative (Milk of Magnesia or Miralax) should be taken according to package instructions if there are no bowel movements after 48 hours. Unless discharge instructions indicate otherwise, you may remove your bandages 24-48 hours after surgery, and you may shower at that time.  You may have steri-strips (small skin tapes) in place directly over the incision.  These strips should be left on the skin for 7-10 days.  If your surgeon used skin glue on the incision, you may shower in 24 hours.  The glue will flake off over the next 2-3 weeks.  Any sutures or staples will be  removed at the office during your follow-up visit. ACTIVITIES:  You may resume regular (light) daily activities beginning the next day--such as daily self-care, walking, climbing stairs--gradually increasing activities as tolerated.  You may have sexual intercourse when it is comfortable.  Refrain from any heavy lifting or straining until approved by your doctor. You may drive when you are no longer taking prescription pain medication, you can comfortably wear a seatbelt, and you can safely maneuver your car and apply brakes. RETURN TO WORK:  __________________________________________________________ You should see your doctor in the office for a follow-up appointment approximately 2-3 weeks after your surgery.  Make sure that you call for this appointment within a day or two after you arrive home to insure a convenient appointment time. OTHER INSTRUCTIONS: __________________________________________________________________________________________________________________________ __________________________________________________________________________________________________________________________ WHEN TO CALL YOUR DOCTOR: Fever over 101.0 Inability to urinate Continued bleeding from incision. Increased pain, redness, or drainage from the incision. Increasing abdominal pain  The clinic staff is available to answer your questions during regular business hours.  Please don't hesitate to call and ask to speak to one of the nurses for clinical concerns.  If you have a medical emergency, go to the nearest emergency room or call 911.  A surgeon from [facility] is always on call at the hospital. [address], [address], [place], [place]  [address] ? [address] [address], [place], [place]   [address] [phone] ? [phone] ? FAX [phone] Web site: [email or web address]  

## 2021-09-06 LAB — HEPATITIS PANEL, ACUTE
HCV Ab: NONREACTIVE
Hep A IgM: NONREACTIVE
Hep B C IgM: NONREACTIVE
Hepatitis B Surface Ag: NONREACTIVE

## 2021-09-11 ENCOUNTER — Other Ambulatory Visit: Payer: Self-pay | Admitting: Family Medicine

## 2021-09-11 DIAGNOSIS — E291 Testicular hypofunction: Secondary | ICD-10-CM

## 2021-09-11 DIAGNOSIS — Z9049 Acquired absence of other specified parts of digestive tract: Secondary | ICD-10-CM | POA: Diagnosis not present

## 2021-09-15 ENCOUNTER — Encounter: Payer: Self-pay | Admitting: Family Medicine

## 2021-09-16 ENCOUNTER — Telehealth: Payer: Self-pay

## 2021-09-16 DIAGNOSIS — D2371 Other benign neoplasm of skin of right lower limb, including hip: Secondary | ICD-10-CM | POA: Diagnosis not present

## 2021-09-16 DIAGNOSIS — Z85828 Personal history of other malignant neoplasm of skin: Secondary | ICD-10-CM | POA: Diagnosis not present

## 2021-09-16 DIAGNOSIS — L720 Epidermal cyst: Secondary | ICD-10-CM | POA: Diagnosis not present

## 2021-09-16 DIAGNOSIS — D1801 Hemangioma of skin and subcutaneous tissue: Secondary | ICD-10-CM | POA: Diagnosis not present

## 2021-09-16 DIAGNOSIS — L821 Other seborrheic keratosis: Secondary | ICD-10-CM | POA: Diagnosis not present

## 2021-09-16 DIAGNOSIS — D224 Melanocytic nevi of scalp and neck: Secondary | ICD-10-CM | POA: Diagnosis not present

## 2021-09-16 DIAGNOSIS — Z23 Encounter for immunization: Secondary | ICD-10-CM | POA: Diagnosis not present

## 2021-09-16 DIAGNOSIS — L818 Other specified disorders of pigmentation: Secondary | ICD-10-CM | POA: Diagnosis not present

## 2021-09-16 DIAGNOSIS — Z85820 Personal history of malignant melanoma of skin: Secondary | ICD-10-CM | POA: Diagnosis not present

## 2021-09-16 DIAGNOSIS — D692 Other nonthrombocytopenic purpura: Secondary | ICD-10-CM | POA: Diagnosis not present

## 2021-09-16 DIAGNOSIS — L570 Actinic keratosis: Secondary | ICD-10-CM | POA: Diagnosis not present

## 2021-09-16 DIAGNOSIS — D485 Neoplasm of uncertain behavior of skin: Secondary | ICD-10-CM | POA: Diagnosis not present

## 2021-09-16 NOTE — Telephone Encounter (Addendum)
Initiated Prior authorization [id number] '30MG'$ /ACT solution Via: Covermymeds Case/Key: [id number] Status: approved  as of 09/16/21 Reason:Effective from 09/16/2021 through 09/17/2022.   Notified Pt via: Mychart

## 2021-09-17 ENCOUNTER — Other Ambulatory Visit: Payer: Self-pay

## 2021-09-17 ENCOUNTER — Other Ambulatory Visit: Payer: Self-pay | Admitting: Family Medicine

## 2021-09-17 MED ORDER — TAMSULOSIN HCL 0.4 MG PO CAPS: 0.4000 mg | ORAL_CAPSULE | Freq: Every day | ORAL | 2 refills | Status: AC

## 2021-10-17 ENCOUNTER — Other Ambulatory Visit: Payer: Self-pay | Admitting: Family Medicine

## 2021-11-09 DIAGNOSIS — Z87891 Personal history of nicotine dependence: Secondary | ICD-10-CM | POA: Diagnosis not present

## 2021-11-25 DIAGNOSIS — H524 Presbyopia: Secondary | ICD-10-CM | POA: Diagnosis not present

## 2021-11-25 DIAGNOSIS — Z961 Presence of intraocular lens: Secondary | ICD-10-CM | POA: Diagnosis not present

## 2021-11-25 DIAGNOSIS — E119 Type 2 diabetes mellitus without complications: Secondary | ICD-10-CM | POA: Diagnosis not present

## 2021-11-25 DIAGNOSIS — H40013 Open angle with borderline findings, low risk, bilateral: Secondary | ICD-10-CM | POA: Diagnosis not present

## 2021-11-25 DIAGNOSIS — H59812 Chorioretinal scars after surgery for detachment, left eye: Secondary | ICD-10-CM | POA: Diagnosis not present

## 2021-11-25 DIAGNOSIS — H04123 Dry eye syndrome of bilateral lacrimal glands: Secondary | ICD-10-CM | POA: Diagnosis not present

## 2021-11-25 DIAGNOSIS — H35373 Puckering of macula, bilateral: Secondary | ICD-10-CM | POA: Diagnosis not present

## 2021-11-25 LAB — HM DIABETES EYE EXAM

## 2021-11-30 ENCOUNTER — Other Ambulatory Visit: Payer: Self-pay | Admitting: Osteopathic Medicine

## 2021-12-02 ENCOUNTER — Other Ambulatory Visit: Payer: Self-pay

## 2021-12-02 MED ORDER — LANTUS SOLOSTAR 100 UNIT/ML ~~LOC~~ SOPN: 60.0000 [IU] | PEN_INJECTOR | Freq: Every day | SUBCUTANEOUS | 2 refills | Status: DC

## 2021-12-02 NOTE — Telephone Encounter (Signed)
[place] requesting med refill for Lantus solostar. Unable to send refill. Written by historical provider.

## 2021-12-08 ENCOUNTER — Encounter: Payer: Self-pay | Admitting: Family Medicine

## 2021-12-08 ENCOUNTER — Ambulatory Visit (INDEPENDENT_AMBULATORY_CARE_PROVIDER_SITE_OTHER): Payer: Medicare Other | Admitting: Family Medicine

## 2021-12-08 VITALS — BP 110/63 | HR 88 | Ht 72.0 in | Wt 200.0 lb

## 2021-12-08 DIAGNOSIS — E1165 Type 2 diabetes mellitus with hyperglycemia: Secondary | ICD-10-CM

## 2021-12-08 DIAGNOSIS — F3341 Major depressive disorder, recurrent, in partial remission: Secondary | ICD-10-CM

## 2021-12-08 DIAGNOSIS — Z23 Encounter for immunization: Secondary | ICD-10-CM

## 2021-12-08 DIAGNOSIS — J441 Chronic obstructive pulmonary disease with (acute) exacerbation: Secondary | ICD-10-CM

## 2021-12-08 DIAGNOSIS — E291 Testicular hypofunction: Secondary | ICD-10-CM

## 2021-12-08 DIAGNOSIS — I10 Essential (primary) hypertension: Secondary | ICD-10-CM | POA: Diagnosis not present

## 2021-12-08 LAB — POCT UA - MICROALBUMIN
Albumin/Creatinine Ratio, Urine, POC: <30
Creatinine, POC: 100 mg/dL
Microalbumin Ur, POC: 10 mg/L

## 2021-12-08 LAB — POCT GLYCOSYLATED HEMOGLOBIN (HGB A1C): HbA1c, POC (controlled diabetic range): 5.9 % (ref 0.0–7.0)

## 2021-12-08 MED ORDER — DOXYCYCLINE HYCLATE 100 MG PO TABS: 100.0000 mg | ORAL_TABLET | Freq: Two times a day (BID) | ORAL | 0 refills | Status: DC

## 2021-12-08 MED ORDER — CICLOPIROX 8 % EX SOLN
Freq: Every day | CUTANEOUS | 0 refills | Status: DC
Start: 2021-12-08 — End: 2022-08-11

## 2021-12-08 MED ORDER — METHYLPREDNISOLONE SODIUM SUCC 125 MG IJ SOLR
125.0000 mg | Freq: Once | INTRAMUSCULAR | Status: AC
  Administered 2021-12-08: 125 mg via INTRAMUSCULAR

## 2021-12-08 MED ORDER — PREDNISONE 20 MG PO TABS: 20.0000 mg | ORAL_TABLET | Freq: Two times a day (BID) | ORAL | 0 refills | Status: AC

## 2021-12-08 MED ORDER — METHYLPREDNISOLONE SODIUM SUCC 125 MG IJ SOLR: 125.0000 mg | Freq: Once | INTRAMUSCULAR | Status: DC

## 2021-12-08 NOTE — Assessment & Plan Note (Signed)
Doing well with bupropion at current strength.  Continue.

## 2021-12-08 NOTE — Progress Notes (Signed)
Frederick Guzman - 71 y.o. male MRN [id number]  Date of birth: February 03, 1951  Subjective Chief Complaint  Patient presents with   Diabetes    HPI Frederick Guzman is a 71 y.o. male here today for follow up visit.   Report some increased shortness of breath with wheezing and mild cough.  Has tested for COVID at home and this has been negative.  Denies fever or chills. Has had fatigue.  He is using his regular inhalers for COPD as directed.    He continues on lantus 60 units qhs for treatment of diabetes.  Using [facility] 2 for monitoring of glucose.  He has alarms in the middle of the night alerting him of low blood sugar into the 50's. He has blurred vision during these periods.   His BP has remained well controlled.  He is taking lisinopril at low strength, mainly for renal protection.    Mood remains stable with current strength of bupropion.  Denies side effects.   ROS:  A comprehensive ROS was completed and negative except as noted per HPI  Allergies  Allergen Reactions   Methotrexate Derivatives Nausea And Vomiting and Other (See Comments)    Hallucinations and aggressive behavior     Tramadol Nausea And Vomiting    Hallucinations and aggressive behavior     Past Medical History:  Diagnosis Date   Allergy    Arthritis    OA   Asthma    Cancer ([place]) 08/2014   melanoma on R shoulder & back - [facility]    COPD (chronic obstructive pulmonary disease) ([place])    Depression    pt. denies- 01/2016, pt on Wellbutrin for smoking cessation   Diabetes mellitus without complication ([place])    Diverticulitis    Dyspnea    with exertion   Fibromyalgia    GERD (gastroesophageal reflux disease)    Hyperlipemia    Hypertension    "never was high"   Lymphocytic colitis 04/2018   Pneumonia 06/2015, 10/2016   Sleep apnea    last study - Nov. 2017, waiting for a new machine, using the old CPAP q night after cleaning the device after he was documented to have pneumonia from his  CPAP machine.     Past Surgical History:  Procedure Laterality Date   carpel tunnel Bilateral    CHOLECYSTECTOMY N/A 08/31/2021   Procedure: LAPAROSCOPIC CHOLECYSTECTOMY AND UMBILICAL HERNIA REPAIR;  Surgeon: Donnie Mesa, MD;  [facility];  Service: General;  Laterality: N/A;   COLONOSCOPY W/ POLYPECTOMY     COLONOSCOPY W/ POLYPECTOMY     EXCISIONAL TOTAL SHOULDER ARTHROPLASTY WITH ANTIBIOTIC SPACER Left 08/09/2016   Procedure: LEFT SHOULDER HARDWARE REMOVAL, IRRIGATION AND DEBRIDEMENT, PLACEMENT OF ANTIBIOTIC SPACER;  Surgeon: Netta Cedars, MD;  Location: [place];  Service: Orthopedics;  Laterality: Left;   IRRIGATION AND DEBRIDEMENT SHOULDER Left 03/31/2016   Procedure: IRRIGATION AND DEBRIDEMENT SHOULDER, deep cultures and polyethylene exchange;  Surgeon: Netta Cedars, MD;  Location: [place];  Service: Orthopedics;  Laterality: Left;  requests 63mns   REVERSE SHOULDER ARTHROPLASTY Left 01/30/2016   Procedure: LEFT REVERSE SHOULDER ARTHROPLASTY;  Surgeon: SNetta Cedars MD;  [place]  Service: Orthopedics;  Laterality: Left;   ROTATOR CUFF REPAIR Left    SHOULDER ARTHROSCOPY WITH DISTAL CLAVICLE RESECTION Right 01/30/2018   Procedure: RIGHT SHOULDER ARTHROSCOPY WITH OPEN DISTAL CLAVICLE RESECTION, SUBACROMIAL DECOMPRESSION;  Surgeon: NNetta Cedars MD;  [place]  Service: Orthopedics;  Laterality: Right;   STERIOD INJECTION  Right 01/30/2018   Procedure: STEROID INJECTION;  Surgeon: Netta Cedars, MD;  Location: [name];  Service: Orthopedics;  Laterality: Right;   TOTAL SHOULDER REVISION Left 01/17/2017   Procedure: LEFT SHOULDER REVISION REVERSE TOTAL SHOULDER ARTHROPLASTY;  Surgeon: Netta Cedars, MD;  Location: [place];  Service: Orthopedics;  Laterality: Left;   UMBILICAL HERNIA REPAIR N/A 08/31/2021   Procedure: HERNIA REPAIR UMBILICAL ADULT possible;  Surgeon: Donnie Mesa, MD;  [facility];  Service: General;  Laterality: N/A;   WRIST SURGERY Right 11/2014   Dr.  Amedeo Plenty    Social History   Socioeconomic History   Marital status: Domestic Partner    Spouse name: Webb Silversmith   Number of children: 2   Years of education: 12   Highest education level: 12th grade  Occupational History   Occupation: lineman    Comment: retired  Tobacco Use   Smoking status: Every Day    Packs/day: 0.75    Years: 43.00    Total pack years: 32.25    Types: Cigarettes   Smokeless tobacco: Never   Tobacco comments:    pt no longer taking chantix  Vaping Use   Vaping Use: Never used  Substance and Sexual Activity   Alcohol use: Yes    Alcohol/week: 9.0 standard drinks of alcohol    Types: 6 Cans of beer, 3 Shots of liquor per week    Comment: beer or mixed drink    Drug use: No   Sexual activity: Yes  Other Topics Concern   Not on file  Social History Narrative   Lives with his girlfriend, Webb Silversmith. Retired. DOes a lot of yard work and handy work. Members of the camping club.   Social Determinants of Health   Financial Resource Strain: Low Risk  (08/05/2021)   Overall Financial Resource Strain (CARDIA)    Difficulty of Paying Living Expenses: Not hard at all  Food Insecurity: No Food Insecurity (08/05/2021)   Hunger Vital Sign    Worried About Running Out of Food in the Last Year: Never true    Ran Out of Food in the Last Year: Never true  Transportation Needs: No Transportation Needs (08/05/2021)   PRAPARE - Hydrologist (Medical): No    Lack of Transportation (Non-Medical): No  Physical Activity: Inactive (08/05/2021)   Exercise Vital Sign    Days of Exercise per Week: 0 days    Minutes of Exercise per Session: 0 min  Stress: No Stress Concern Present (08/05/2021)   [place]    Feeling of Stress : Only a little  Social Connections: Socially Integrated (08/05/2021)   Social Connection and Isolation Panel [NHANES]    Frequency of Communication with Friends and  Family: More than three times a week    Frequency of Social Gatherings with Friends and Family: More than three times a week    Attends Religious Services: More than 4 times per year    Active Member of [employer] or Organizations: Yes    Attends Music therapist: More than 4 times per year    Marital Status: Married    Family History  Problem Relation Age of Onset   Diabetes Mother    Colon cancer Father    Prostate cancer Father    Diabetes Father    Esophageal cancer Neg Hx    Rectal cancer Neg Hx    Stomach cancer Neg Hx     Health Maintenance  Topic Date Due   INFLUENZA VACCINE  10/20/2021   COVID-19 Vaccine (7 - Pfizer risk series) 04/22/2022 (Originally 11/11/2021)   TETANUS/TDAP  08/06/2022 (Originally 05/30/2021)   OPHTHALMOLOGY EXAM  12/15/2021   FOOT EXAM  03/31/2022   HEMOGLOBIN A1C  06/08/2022   Diabetic kidney evaluation - GFR measurement  09/06/2022   Diabetic kidney evaluation - Urine ACR  12/09/2022   COLONOSCOPY (Pts 45-84yr Insurance coverage will need to be confirmed)  05/11/2025   Pneumonia Vaccine 71 Years old  Completed   Hepatitis C Screening  Completed   Zoster Vaccines- Shingrix  Completed   HPV VACCINES  Aged Out     ----------------------------------------------------------------------------------------------------------------------------------------------------------------------------------------------------------------- Physical Exam BP 110/63 (BP Location: Left Arm, Patient Position: Sitting, Cuff Size: Large)   Pulse 88   Ht 6' (1.829 m)   Wt 200 lb (90.7 kg)   SpO2 93%   BMI 27.12 kg/m   Physical Exam Constitutional:      Appearance: Normal appearance.  Eyes:     General: No scleral icterus. Cardiovascular:     Rate and Rhythm: Normal rate and regular rhythm.     Pulses: Normal pulses.     Heart sounds: Normal heart sounds.  Pulmonary:     Breath sounds: Wheezing present.  Musculoskeletal:     Cervical back: Neck  supple.  Neurological:     General: No focal deficit present.     Mental Status: He is alert.  Psychiatric:        Mood and Affect: Mood normal.        Behavior: Behavior normal.     ------------------------------------------------------------------------------------------------------------------------------------------------------------------------------------------------------------------- Assessment and Plan  HTN (hypertension) BP is still well controlled at this time.  Continue low strength of lisinopril.   COPD with exacerbation (HCC) Increased wheezing and dyspnea.  Given '125mg'$  of solu-medrol in clinic today.  Continue steroid burst of '20mg'$  BID x5 days starting tomorrow.  Adding doxycycline '100mg'$  bid x10 days.  Contact clinic if worsening.  Continue prescribed inhalers.   Uncontrolled type 2 diabetes mellitus with hyperglycemia, without long-term current use of insulin (HCC) Diabetes is well controlled at this time but I am concerned about his hypoglycemia at night.  Recommend decrease of lantus by 5 units at bedtime once steroids are completed.  If still having low readings we'll continue to down titrate.   Hypogonadism male He continues to do well with topical testosterone.  Continue at current strength.   MDD (major depressive disorder) ([place] Doing well with bupropion at current strength.  Continue.    Meds ordered this encounter  Medications   ciclopirox (PENLAC) 8 % solution    Sig: Apply topically at bedtime. Apply over nail and surrounding skin. Apply daily over previous coat. After seven (7) days, may remove with alcohol and continue cycle.    Dispense:  6.6 mL    Refill:  0   doxycycline (VIBRA-TABS) 100 MG tablet    Sig: Take 1 tablet (100 mg total) by mouth 2 (two) times daily.    Dispense:  20 tablet    Refill:  0   predniSONE (DELTASONE) 20 MG tablet    Sig: Take 1 tablet (20 mg total) by mouth 2 (two) times daily with a meal for 5 days.    Dispense:  10  tablet    Refill:  0    Return in about 4 months (around 04/09/2022) for T2DM.    This visit occurred during the SARS-CoV-2 public health emergency.  Safety protocols were in place,  including screening questions prior to the visit, additional usage of staff PPE, and extensive cleaning of exam room while observing appropriate contact time as indicated for disinfecting solutions.

## 2021-12-08 NOTE — Assessment & Plan Note (Signed)
Diabetes is well controlled at this time but I am concerned about his hypoglycemia at night.  Recommend decrease of lantus by 5 units at bedtime once steroids are completed.  If still having low readings we'll continue to down titrate.

## 2021-12-08 NOTE — Assessment & Plan Note (Signed)
BP is still well controlled at this time.  Continue low strength of lisinopril.

## 2021-12-08 NOTE — Assessment & Plan Note (Signed)
Increased wheezing and dyspnea.  Given '125mg'$  of solu-medrol in clinic today.  Continue steroid burst of '20mg'$  BID x5 days starting tomorrow.  Adding doxycycline '100mg'$  bid x10 days.  Contact clinic if worsening.  Continue prescribed inhalers.

## 2021-12-08 NOTE — Assessment & Plan Note (Signed)
He continues to do well with topical testosterone.  Continue at current strength.

## 2021-12-08 NOTE — Patient Instructions (Addendum)
For breathing-Start prednisone tomorrow.  Start doxycycline today.  Reduce lantus to 55 units after you finish prednisone  Try ciclopirox [place] on toenails  See me again in 4 months.

## 2021-12-14 ENCOUNTER — Other Ambulatory Visit: Payer: Self-pay | Admitting: Family Medicine

## 2021-12-25 ENCOUNTER — Encounter: Payer: Self-pay | Admitting: Family Medicine

## 2022-01-08 DIAGNOSIS — Z23 Encounter for immunization: Secondary | ICD-10-CM | POA: Diagnosis not present

## 2022-01-15 ENCOUNTER — Telehealth: Payer: Self-pay

## 2022-01-15 NOTE — Telephone Encounter (Signed)
Received fax request from [facility]) requesting most recent medical records concerning CGM.   Fax sent on 01/15/22.

## 2022-02-04 ENCOUNTER — Other Ambulatory Visit: Payer: Self-pay | Admitting: Family Medicine

## 2022-02-08 ENCOUNTER — Other Ambulatory Visit: Payer: Self-pay | Admitting: Family Medicine

## 2022-02-18 ENCOUNTER — Other Ambulatory Visit: Payer: Self-pay | Admitting: Physician Assistant

## 2022-03-05 ENCOUNTER — Other Ambulatory Visit: Payer: Self-pay | Admitting: Family Medicine

## 2022-03-10 DIAGNOSIS — R918 Other nonspecific abnormal finding of lung field: Secondary | ICD-10-CM | POA: Diagnosis not present

## 2022-03-10 DIAGNOSIS — J441 Chronic obstructive pulmonary disease with (acute) exacerbation: Secondary | ICD-10-CM | POA: Diagnosis not present

## 2022-03-10 DIAGNOSIS — G4733 Obstructive sleep apnea (adult) (pediatric): Secondary | ICD-10-CM | POA: Diagnosis not present

## 2022-03-10 DIAGNOSIS — F17210 Nicotine dependence, cigarettes, uncomplicated: Secondary | ICD-10-CM | POA: Diagnosis not present

## 2022-03-16 ENCOUNTER — Other Ambulatory Visit: Payer: Self-pay | Admitting: Family Medicine

## 2022-03-18 DIAGNOSIS — L821 Other seborrheic keratosis: Secondary | ICD-10-CM | POA: Diagnosis not present

## 2022-03-18 DIAGNOSIS — C44722 Squamous cell carcinoma of skin of right lower limb, including hip: Secondary | ICD-10-CM | POA: Diagnosis not present

## 2022-03-18 DIAGNOSIS — D692 Other nonthrombocytopenic purpura: Secondary | ICD-10-CM | POA: Diagnosis not present

## 2022-03-18 DIAGNOSIS — L720 Epidermal cyst: Secondary | ICD-10-CM | POA: Diagnosis not present

## 2022-03-18 DIAGNOSIS — L814 Other melanin hyperpigmentation: Secondary | ICD-10-CM | POA: Diagnosis not present

## 2022-03-18 DIAGNOSIS — L570 Actinic keratosis: Secondary | ICD-10-CM | POA: Diagnosis not present

## 2022-03-18 DIAGNOSIS — Z85828 Personal history of other malignant neoplasm of skin: Secondary | ICD-10-CM | POA: Diagnosis not present

## 2022-03-18 DIAGNOSIS — Z85820 Personal history of malignant melanoma of skin: Secondary | ICD-10-CM | POA: Diagnosis not present

## 2022-04-09 ENCOUNTER — Ambulatory Visit: Payer: Medicare Other | Admitting: Family Medicine

## 2022-04-13 ENCOUNTER — Ambulatory Visit (INDEPENDENT_AMBULATORY_CARE_PROVIDER_SITE_OTHER): Payer: Medicare Other | Admitting: Family Medicine

## 2022-04-13 ENCOUNTER — Encounter: Payer: Self-pay | Admitting: Family Medicine

## 2022-04-13 VITALS — BP 138/64 | HR 84 | Ht 72.0 in | Wt 206.0 lb

## 2022-04-13 DIAGNOSIS — R0609 Other forms of dyspnea: Secondary | ICD-10-CM | POA: Insufficient documentation

## 2022-04-13 DIAGNOSIS — N400 Enlarged prostate without lower urinary tract symptoms: Secondary | ICD-10-CM | POA: Diagnosis not present

## 2022-04-13 DIAGNOSIS — E291 Testicular hypofunction: Secondary | ICD-10-CM

## 2022-04-13 DIAGNOSIS — F17200 Nicotine dependence, unspecified, uncomplicated: Secondary | ICD-10-CM | POA: Diagnosis not present

## 2022-04-13 DIAGNOSIS — E785 Hyperlipidemia, unspecified: Secondary | ICD-10-CM

## 2022-04-13 DIAGNOSIS — F17210 Nicotine dependence, cigarettes, uncomplicated: Secondary | ICD-10-CM

## 2022-04-13 DIAGNOSIS — R252 Cramp and spasm: Secondary | ICD-10-CM | POA: Diagnosis not present

## 2022-04-13 DIAGNOSIS — E1165 Type 2 diabetes mellitus with hyperglycemia: Secondary | ICD-10-CM

## 2022-04-13 DIAGNOSIS — I10 Essential (primary) hypertension: Secondary | ICD-10-CM

## 2022-04-13 DIAGNOSIS — R0600 Dyspnea, unspecified: Secondary | ICD-10-CM

## 2022-04-13 DIAGNOSIS — J441 Chronic obstructive pulmonary disease with (acute) exacerbation: Secondary | ICD-10-CM | POA: Diagnosis not present

## 2022-04-13 DIAGNOSIS — F3341 Major depressive disorder, recurrent, in partial remission: Secondary | ICD-10-CM | POA: Diagnosis not present

## 2022-04-13 NOTE — Patient Instructions (Signed)
Let me know if wheezing is getting worse. We may need to add steroids if not improving.

## 2022-04-13 NOTE — Assessment & Plan Note (Signed)
Because of continued swelling in his lower extremities.  Also having some dyspnea which I think is more related to his pulmonary disease.  I did order an echocardiogram

## 2022-04-13 NOTE — Progress Notes (Signed)
Frederick Guzman - 72 y.o. male MRN [id number]  Date of birth: 04/23/1950  Subjective Chief Complaint  Patient presents with   Diabetes    HPI Frederick Guzman is a 72 y.o. male here today for follow up visit.   He reports that he is doing ok.   He continues on lantus for management of diabetes.  He is currently using 60 units.  Blood sugars at home have been in the 150s.[place] Kitchen  He denies hypoglycemia.  Continues on lisinopril for renal protection.  BP remains well controlled.    He has had some swelling.  Has lasix and he self titrated this on his own recently.  He has noted some increased cramping of the legs.    Continues on bupropion for depression symptoms.  He does report that he has been more irritable recently.  His long-term girlfriend is considering moving out [place] to be closer to her children.  He does not have any intention moving  He continues to smoke about 3/4 ppd.  He continues on breztri daily.  Has albuterol neb and inhaler.  He has more wheezing and [place] more short of breath recently.  He does see pulmonology.  Recently on a course of prednisone and doxycycline  Reports decreased libido and erectile difficulty.  Has used topical testosterone previously.     ROS:  A comprehensive ROS was completed and negative except as noted per HPI  Allergies  Allergen Reactions   Methotrexate Derivatives Nausea And Vomiting and Other (See Comments)    Hallucinations and aggressive behavior     Tramadol Nausea And Vomiting    Hallucinations and aggressive behavior     Past Medical History:  Diagnosis Date   Allergy    Arthritis    OA   Asthma    Cancer ([place]) 08/2014   melanoma on R shoulder & back - [facility]    COPD (chronic obstructive pulmonary disease) ([place])    Depression    pt. denies- 01/2016, pt on Wellbutrin for smoking cessation   Diabetes mellitus without complication ([place])    Diverticulitis    Dyspnea    with exertion   Fibromyalgia    GERD  (gastroesophageal reflux disease)    Hyperlipemia    Hypertension    "never was high"   Lymphocytic colitis 04/2018   Pneumonia 06/2015, 10/2016   Sleep apnea    last study - Nov. 2017, waiting for a new machine, using the old CPAP q night after cleaning the device after he was documented to have pneumonia from his CPAP machine.     Past Surgical History:  Procedure Laterality Date   carpel tunnel Bilateral    CHOLECYSTECTOMY N/A 08/31/2021   Procedure: LAPAROSCOPIC CHOLECYSTECTOMY AND UMBILICAL HERNIA REPAIR;  Surgeon: Donnie Mesa, MD;  [facility];  Service: General;  Laterality: N/A;   COLONOSCOPY W/ POLYPECTOMY     COLONOSCOPY W/ POLYPECTOMY     EXCISIONAL TOTAL SHOULDER ARTHROPLASTY WITH ANTIBIOTIC SPACER Left 08/09/2016   Procedure: LEFT SHOULDER HARDWARE REMOVAL, IRRIGATION AND DEBRIDEMENT, PLACEMENT OF ANTIBIOTIC SPACER;  Surgeon: Netta Cedars, MD;  Location: [place];  Service: Orthopedics;  Laterality: Left;   IRRIGATION AND DEBRIDEMENT SHOULDER Left 03/31/2016   Procedure: IRRIGATION AND DEBRIDEMENT SHOULDER, deep cultures and polyethylene exchange;  Surgeon: Netta Cedars, MD;  Location: [place];  Service: Orthopedics;  Laterality: Left;  requests 34mns   REVERSE SHOULDER ARTHROPLASTY Left 01/30/2016   Procedure: LEFT REVERSE SHOULDER ARTHROPLASTY;  Surgeon: SNetta Cedars MD;  Location: [place];  Service: Orthopedics;  Laterality: Left;   ROTATOR CUFF REPAIR Left    SHOULDER ARTHROSCOPY WITH DISTAL CLAVICLE RESECTION Right 01/30/2018   Procedure: RIGHT SHOULDER ARTHROSCOPY WITH OPEN DISTAL CLAVICLE RESECTION, SUBACROMIAL DECOMPRESSION;  Surgeon: Netta Cedars, MD;  Location: [place];  Service: Orthopedics;  Laterality: Right;   STERIOD INJECTION Right 01/30/2018   Procedure: STEROID INJECTION;  Surgeon: Netta Cedars, MD;  Location: [place];  Service: Orthopedics;  Laterality: Right;   TOTAL SHOULDER REVISION Left 01/17/2017   Procedure: LEFT SHOULDER REVISION REVERSE TOTAL  SHOULDER ARTHROPLASTY;  Surgeon: Netta Cedars, MD;  Location: [place];  Service: Orthopedics;  Laterality: Left;   UMBILICAL HERNIA REPAIR N/A 08/31/2021   Procedure: HERNIA REPAIR UMBILICAL ADULT possible;  Surgeon: Donnie Mesa, MD;  [facility];  Service: General;  Laterality: N/A;   WRIST SURGERY Right 11/2014   Dr. Amedeo Plenty    Social History   Socioeconomic History   Marital status: Domestic Partner    Spouse name: Webb Silversmith   Number of children: 2   Years of education: 12   Highest education level: 12th grade  Occupational History   Occupation: lineman    Comment: retired  Tobacco Use   Smoking status: Every Day    Packs/day: 0.75    Years: 43.00    Total pack years: 32.25    Types: Cigarettes   Smokeless tobacco: Never   Tobacco comments:    pt no longer taking chantix  Vaping Use   Vaping Use: Never used  Substance and Sexual Activity   Alcohol use: Yes    Alcohol/week: 9.0 standard drinks of alcohol    Types: 6 Cans of beer, 3 Shots of liquor per week    Comment: beer or mixed drink    Drug use: No   Sexual activity: Yes  Other Topics Concern   Not on file  Social History Narrative   Lives with his girlfriend, Webb Silversmith. Retired. DOes a lot of yard work and handy work. Members of the camping club.   Social Determinants of Health   Financial Resource Strain: Low Risk  (08/05/2021)   Overall Financial Resource Strain (CARDIA)    Difficulty of Paying Living Expenses: Not hard at all  Food Insecurity: No Food Insecurity (08/05/2021)   Hunger Vital Sign    Worried About Running Out of Food in the Last Year: Never true    Ran Out of Food in the Last Year: Never true  Transportation Needs: No Transportation Needs (08/05/2021)   PRAPARE - Hydrologist (Medical): No    Lack of Transportation (Non-Medical): No  Physical Activity: Inactive (08/05/2021)   Exercise Vital Sign    Days of Exercise per Week: 0 days    Minutes of Exercise per Session:  0 min  Stress: No Stress Concern Present (08/05/2021)   [place]    Feeling of Stress : Only a little  Social Connections: Socially Integrated (08/05/2021)   Social Connection and Isolation Panel [NHANES]    Frequency of Communication with Friends and Family: More than three times a week    Frequency of Social Gatherings with Friends and Family: More than three times a week    Attends Religious Services: More than 4 times per year    Active Member of [employer] or Organizations: Yes    Attends Music therapist: More than 4 times per year    Marital Status: Married  Family History  Problem Relation Age of Onset   Diabetes Mother    Colon cancer Father    Prostate cancer Father    Diabetes Father    Esophageal cancer Neg Hx    Rectal cancer Neg Hx    Stomach cancer Neg Hx     Health Maintenance  Topic Date Due   Lung Cancer Screening  10/24/2018   COVID-19 Vaccine (7 - 2023-24 season) 04/22/2022 (Originally 11/20/2021)   HEMOGLOBIN A1C  06/08/2022   Medicare Annual Wellness (AWV)  08/06/2022   Diabetic kidney evaluation - eGFR measurement  09/06/2022   OPHTHALMOLOGY EXAM  11/26/2022   Diabetic kidney evaluation - Urine ACR  12/09/2022   FOOT EXAM  04/14/2023   COLONOSCOPY (Pts 45-62yr Insurance coverage will need to be confirmed)  05/11/2025   DTaP/Tdap/Td (2 - Td or Tdap) 02/18/2029   Pneumonia Vaccine 72 Years old  Completed   INFLUENZA VACCINE  Completed   Hepatitis C Screening  Completed   Zoster Vaccines- Shingrix  Completed   HPV VACCINES  Aged Out     ----------------------------------------------------------------------------------------------------------------------------------------------------------------------------------------------------------------- Physical Exam BP 138/64 (BP Location: Left Arm, Patient Position: Sitting, Cuff Size: Large)   Pulse 84   Ht 6' (1.829 m)   Wt 206  lb (93.4 kg)   SpO2 95%   BMI 27.94 kg/m   Physical Exam Constitutional:      Appearance: Normal appearance.  Eyes:     General: No scleral icterus. Cardiovascular:     Rate and Rhythm: Normal rate and regular rhythm.  Pulmonary:     Effort: Pulmonary effort is normal.     Breath sounds: Normal breath sounds.  Musculoskeletal:     Cervical back: Neck supple.  Neurological:     Mental Status: He is alert.     ------------------------------------------------------------------------------------------------------------------------------------------------------------------------------------------------------------------- Assessment and Plan  HTN (hypertension) Blood pressure remains well-controlled.  He will continue on lisinopril at current strength.  COPD with exacerbation ([facility] He continues to see pulmonology.  He is having some increased wheezing at this time.  Will continue Breztri as well as albuterol as needed.  Declines steroid at this time.  He would like to see if this improves on his own as he was just treated with steroids and doxycycline.  Discussed with him that if having worsening symptoms to contact clinic.  Hypogonadism male Okay testosterone levels and checking PSA.  TOBACCO ABUSE Counseled on smoking cessation.  He has tried several methods to quit in the past without success.  Declines any additional assistance.  MDD (major depressive disorder) ([place] Still with depressive symptoms.  We did discuss possibly increasing his bupropion however he would like to hold off on this at this time.  Dyspnea Because of continued swelling in his lower extremities.  Also having some dyspnea which I think is more related to his pulmonary disease.  I did order an echocardiogram   No orders of the defined types were placed in this encounter.   Return in about 4 months (around 08/12/2022) for F/u T2DM.    This visit occurred during the SARS-CoV-2 public health emergency.   Safety protocols were in place, including screening questions prior to the visit, additional usage of staff PPE, and extensive cleaning of exam room while observing appropriate contact time as indicated for disinfecting solutions.

## 2022-04-13 NOTE — Assessment & Plan Note (Signed)
Counseled on smoking cessation.  He has tried several methods to quit in the past without success.  Declines any additional assistance.

## 2022-04-13 NOTE — Assessment & Plan Note (Signed)
Blood pressure remains well-controlled.  He will continue on lisinopril at current strength.

## 2022-04-13 NOTE — Assessment & Plan Note (Signed)
Okay testosterone levels and checking PSA.

## 2022-04-13 NOTE — Assessment & Plan Note (Signed)
Still with depressive symptoms.  We did discuss possibly increasing his bupropion however he would like to hold off on this at this time.

## 2022-04-13 NOTE — Assessment & Plan Note (Addendum)
He continues to see pulmonology.  He is having some increased wheezing at this time.  Will continue Breztri as well as albuterol as needed.  Declines steroid at this time.  He would like to see if this improves on his own as he was just treated with steroids and doxycycline.  Discussed with him that if having worsening symptoms to contact clinic.

## 2022-04-14 LAB — LIPID PANEL W/REFLEX DIRECT LDL
Cholesterol: 160 mg/dL (ref <200)
HDL: 50 mg/dL (ref >=40)
LDL Cholesterol (Calc): 90 mg/dL (calc)
Non-HDL Cholesterol (Calc): 110 mg/dL (calc) (ref <130)
Total CHOL/HDL Ratio: 3.2 (calc) (ref <5.0)
Triglycerides: 103 mg/dL (ref <150)

## 2022-04-14 LAB — CBC WITH DIFFERENTIAL/PLATELET
Absolute Monocytes: 372 cells/uL (ref 200–950)
Basophils Absolute: 18 cells/uL (ref 0–200)
Basophils Relative: 0.3 %
Eosinophils Absolute: 214 cells/uL (ref 15–500)
Eosinophils Relative: 3.5 %
HCT: 44.5 % (ref 38.5–50.0)
Hemoglobin: 15.6 g/dL (ref 13.2–17.1)
Lymphs Abs: 2342 cells/uL (ref 850–3900)
MCH: 32.1 pg (ref 27.0–33.0)
MCHC: 35.1 g/dL (ref 32.0–36.0)
MCV: 91.6 fL (ref 80.0–100.0)
MPV: 9.6 fL (ref 7.5–12.5)
Monocytes Relative: 6.1 %
Neutro Abs: 3154 cells/uL (ref 1500–7800)
Neutrophils Relative %: 51.7 %
Platelets: 191 10*3/uL (ref 140–400)
RBC: 4.86 10*6/uL (ref 4.20–5.80)
RDW: 12.6 % (ref 11.0–15.0)
Total Lymphocyte: 38.4 %
WBC: 6.1 10*3/uL (ref 3.8–10.8)

## 2022-04-14 LAB — COMPLETE METABOLIC PANEL WITH GFR
AG Ratio: 1.7 (calc) (ref 1.0–2.5)
ALT: 27 U/L (ref 9–46)
AST: 21 U/L (ref 10–35)
Albumin: 4.2 g/dL (ref 3.6–5.1)
Alkaline phosphatase (APISO): 85 U/L (ref 35–144)
BUN: 16 mg/dL (ref 7–25)
CO2: 29 mmol/L (ref 20–32)
Calcium: 9.6 mg/dL (ref 8.6–10.3)
Chloride: 104 mmol/L (ref 98–110)
Creat: 1.18 mg/dL (ref 0.70–1.28)
Globulin: 2.5 g/dL (calc) (ref 1.9–3.7)
Glucose, Bld: 112 mg/dL — ABNORMAL HIGH (ref 65–99)
Potassium: 4.2 mmol/L (ref 3.5–5.3)
Sodium: 142 mmol/L (ref 135–146)
Total Bilirubin: 0.7 mg/dL (ref 0.2–1.2)
Total Protein: 6.7 g/dL (ref 6.1–8.1)
eGFR: 66 mL/min/{1.73_m2} (ref >=60)

## 2022-04-14 LAB — HEMOGLOBIN A1C
Hgb A1c MFr Bld: 7.2 % of total Hgb — ABNORMAL HIGH (ref <5.7)
Mean Plasma Glucose: 160 mg/dL
eAG (mmol/L): 8.9 mmol/L

## 2022-04-14 LAB — TSH: TSH: 3.07 mIU/L (ref 0.40–4.50)

## 2022-04-14 LAB — MAGNESIUM: Magnesium: 2.1 mg/dL (ref 1.5–2.5)

## 2022-04-14 LAB — PSA: PSA: 1.53 ng/mL (ref <=4.00)

## 2022-04-14 LAB — TESTOSTERONE: Testosterone: 1000 ng/dL — ABNORMAL HIGH (ref 250–827)

## 2022-04-16 ENCOUNTER — Encounter: Payer: Self-pay | Admitting: Family Medicine

## 2022-04-26 ENCOUNTER — Other Ambulatory Visit: Payer: Self-pay | Admitting: Osteopathic Medicine

## 2022-04-29 ENCOUNTER — Ambulatory Visit (HOSPITAL_BASED_OUTPATIENT_CLINIC_OR_DEPARTMENT_OTHER)
Admission: RE | Admit: 2022-04-29 | Discharge: 2022-04-29 | Disposition: A | Discharge status: Home / Self Care | Payer: Medicare Other | Source: Ambulatory Visit | Attending: Family Medicine | Admitting: Family Medicine

## 2022-04-29 DIAGNOSIS — R0609 Other forms of dyspnea: Secondary | ICD-10-CM

## 2022-04-29 DIAGNOSIS — R0600 Dyspnea, unspecified: Secondary | ICD-10-CM | POA: Insufficient documentation

## 2022-04-29 LAB — ECHOCARDIOGRAM COMPLETE
Area-P 1/2: 4.86 cm2
S' Lateral: 3 cm

## 2022-05-02 ENCOUNTER — Encounter: Payer: Self-pay | Admitting: Family Medicine

## 2022-05-02 DIAGNOSIS — I5189 Other ill-defined heart diseases: Secondary | ICD-10-CM

## 2022-05-02 DIAGNOSIS — R0600 Dyspnea, unspecified: Secondary | ICD-10-CM

## 2022-05-04 ENCOUNTER — Other Ambulatory Visit: Payer: Self-pay

## 2022-05-04 MED ORDER — TADALAFIL 20 MG PO TABS: 10.0000 mg | ORAL_TABLET | ORAL | 1 refills | Status: DC | PRN

## 2022-05-05 ENCOUNTER — Ambulatory Visit: Payer: Medicare Other | Attending: Cardiology | Admitting: Cardiology

## 2022-05-05 VITALS — BP 134/68 | HR 89 | Ht 72.0 in | Wt 206.1 lb

## 2022-05-05 DIAGNOSIS — K5792 Diverticulitis of intestine, part unspecified, without perforation or abscess without bleeding: Secondary | ICD-10-CM | POA: Insufficient documentation

## 2022-05-05 DIAGNOSIS — M1990 Unspecified osteoarthritis, unspecified site: Secondary | ICD-10-CM | POA: Insufficient documentation

## 2022-05-05 DIAGNOSIS — J449 Chronic obstructive pulmonary disease, unspecified: Secondary | ICD-10-CM | POA: Insufficient documentation

## 2022-05-05 DIAGNOSIS — E782 Mixed hyperlipidemia: Secondary | ICD-10-CM | POA: Diagnosis not present

## 2022-05-05 DIAGNOSIS — R0602 Shortness of breath: Secondary | ICD-10-CM | POA: Insufficient documentation

## 2022-05-05 DIAGNOSIS — G4733 Obstructive sleep apnea (adult) (pediatric): Secondary | ICD-10-CM

## 2022-05-05 DIAGNOSIS — T7840XA Allergy, unspecified, initial encounter: Secondary | ICD-10-CM | POA: Insufficient documentation

## 2022-05-05 DIAGNOSIS — J431 Panlobular emphysema: Secondary | ICD-10-CM | POA: Diagnosis not present

## 2022-05-05 DIAGNOSIS — E1165 Type 2 diabetes mellitus with hyperglycemia: Secondary | ICD-10-CM | POA: Diagnosis not present

## 2022-05-05 DIAGNOSIS — F17200 Nicotine dependence, unspecified, uncomplicated: Secondary | ICD-10-CM

## 2022-05-05 DIAGNOSIS — R079 Chest pain, unspecified: Secondary | ICD-10-CM

## 2022-05-05 DIAGNOSIS — R0609 Other forms of dyspnea: Secondary | ICD-10-CM

## 2022-05-05 DIAGNOSIS — F17210 Nicotine dependence, cigarettes, uncomplicated: Secondary | ICD-10-CM | POA: Diagnosis present

## 2022-05-05 DIAGNOSIS — I209 Angina pectoris, unspecified: Secondary | ICD-10-CM

## 2022-05-05 DIAGNOSIS — E785 Hyperlipidemia, unspecified: Secondary | ICD-10-CM | POA: Insufficient documentation

## 2022-05-05 DIAGNOSIS — R072 Precordial pain: Secondary | ICD-10-CM | POA: Diagnosis not present

## 2022-05-05 DIAGNOSIS — J189 Pneumonia, unspecified organism: Secondary | ICD-10-CM | POA: Insufficient documentation

## 2022-05-05 MED ORDER — NITROGLYCERIN 0.4 MG SL SUBL: 0.4000 mg | SUBLINGUAL_TABLET | SUBLINGUAL | 6 refills | Status: AC | PRN

## 2022-05-05 NOTE — Progress Notes (Signed)
Cardiology Office Note:    Date:  05/05/2022   ID:  Frederick Guzman, DOB Jul 04, 1950, MRN [id number]  PCP:  Luetta Nutting, DO  Cardiologist:  Jenean Lindau, MD   Referring MD: Luetta Nutting, DO    ASSESSMENT:    1. DOE (dyspnea on exertion)   2. Chest pain, unspecified type   3. Precordial pain   4. Angina pectoris ([place])   5. OBSTRUCTIVE SLEEP APNEA   6. Panlobular emphysema ([place])   7. TOBACCO ABUSE   8. Mixed hyperlipidemia   9. Uncontrolled type 2 diabetes mellitus with hyperglycemia, without long-term current use of insulin (HCC)    PLAN:    In order of problems listed above:  Chest tightness: Angina pectoris: Dyspnea on exertion: These are new symptoms.  These are of concern.  Patient has multiple risk factors for coronary artery disease.  I discussed various modalities of evaluation.  He prefers coronary angiography with CT scan and FFR.  Benefits and potential risks explained and he vocalized understanding.  Sublingual nitroglycerin prescription was sent, its protocol and 911 protocol explained and the patient vocalized understanding questions were answered to the patient's satisfaction Mixed dyslipidemia: On lipid-lowering medications followed by primary care.  Diet emphasized. Diabetes mellitus: Diet emphasized.  This is followed by primary care. Cigarette smoker: I spent 5 minutes with the patient discussing solely about smoking. Smoking cessation was counseled. I suggested to the patient also different medications and pharmacological interventions. Patient is keen to try stopping on its own at this time. He will get back to me if he needs any further assistance in this matter. Patient will be seen in follow-up appointment in 6 months or earlier if the patient has any concerns.  Visit entirely was chaperoned by Jerl Santos, CMA    Medication Adjustments/Labs and Tests Ordered: Current medicines are reviewed at length with the patient today.  Concerns regarding  medicines are outlined above.  Orders Placed This Encounter  Procedures   CT CORONARY MORPH W/CTA COR W/SCORE W/CA W/CM &/OR WO/CM   Basic metabolic panel   EKG [id number]   No orders of the defined types were placed in this encounter.    History of Present Illness:    Frederick Guzman is a 72 y.o. male who is being seen today for the evaluation of dyspnea on exertion and chest tightness at the request of Luetta Nutting, DO.  Patient is a pleasant 72 year old male.  He has past medical history of COPD, diabetes mellitus and extensive smoking history.  Patient mentions to me that he is noticing shortness of breath which is getting worse with exertion.  He has also chest tightness at times.  No orthopnea or PND.  No radiation of the symptoms to the neck or to the arms.  At the time of my evaluation, the patient is alert awake oriented and in no distress.  Patient accompanied by his wife during this visit.  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   Arthritis    OA   Arthritis of wrist 05/28/2011   Biliary obstruction 09/04/2021   BPH (benign prostatic hyperplasia) 06/17/2011   BRONCHITIS, CHRONIC 03/10/2007   Qualifier: Diagnosis of   By: Halford Chessman MD, Vineet       Cancer [facility]) 08/2014   melanoma on R shoulder & back - [facility]    Combined forms of age-related cataract of right eye 09/21/2018  COPD (chronic obstructive pulmonary disease) (HCC)    COPD with exacerbation ([place]) 04/13/2021   Diverticulitis    Dyspnea    with exertion   Elevated liver enzymes 09/03/2021   Generalized abdominal pain    GERD (gastroesophageal reflux disease)    HTN (hypertension)    Hyperlipemia    Hyperlipidemia 08/07/2020   Hypogonadism male 06/17/2011   Long term use of drug 04/19/2017   Lymphocytic colitis 04/2018   MDD (major depressive disorder) ([place]) 04/30/2011   OBSTRUCTIVE SLEEP APNEA 03/10/2007    Qualifier: Diagnosis of   By: Halford Chessman MD, Vineet       Pain in joint of left shoulder 04/19/2017   Pain in right hand 08/04/2017   Pneumonia 06/2015, 10/2016   Positive depression screening 09/21/2016   Pulmonary nodules 10/23/2017   S/P laparoscopic cholecystectomy 09/03/2021   S/P shoulder replacement, left 01/30/2016   Status post shoulder surgery 08/09/2016   TOBACCO ABUSE 03/10/2007   Qualifier: Diagnosis of   By: Halford Chessman MD, Vineet       Transaminitis 09/03/2021   Uncontrolled type 2 diabetes mellitus with hyperglycemia, without long-term current use of insulin ([place]) 09/21/2016   Likely will need dual or triple therapy but will start with metformin alone in case of intolerance. Close follow-up with home blood sugar readings. ?steroid rol    Past Surgical History:  Procedure Laterality Date   carpel tunnel Bilateral    CHOLECYSTECTOMY N/A 08/31/2021   Procedure: LAPAROSCOPIC CHOLECYSTECTOMY AND UMBILICAL HERNIA REPAIR;  Surgeon: Donnie Mesa, MD;  Location: [facility];  Service: General;  Laterality: N/A;   COLONOSCOPY W/ POLYPECTOMY     COLONOSCOPY W/ POLYPECTOMY     EXCISIONAL TOTAL SHOULDER ARTHROPLASTY WITH ANTIBIOTIC SPACER Left 08/09/2016   Procedure: LEFT SHOULDER HARDWARE REMOVAL, IRRIGATION AND DEBRIDEMENT, PLACEMENT OF ANTIBIOTIC SPACER;  Surgeon: Netta Cedars, MD;  Location: [place];  Service: Orthopedics;  Laterality: Left;   IRRIGATION AND DEBRIDEMENT SHOULDER Left 03/31/2016   Procedure: IRRIGATION AND DEBRIDEMENT SHOULDER, deep cultures and polyethylene exchange;  Surgeon: Netta Cedars, MD;  Location: [place];  Service: Orthopedics;  Laterality: Left;  requests 47mns   REVERSE SHOULDER ARTHROPLASTY Left 01/30/2016   Procedure: LEFT REVERSE SHOULDER ARTHROPLASTY;  Surgeon: SNetta Cedars MD;  [place]  Service: Orthopedics;  Laterality: Left;   ROTATOR CUFF REPAIR Left    SHOULDER ARTHROSCOPY WITH DISTAL CLAVICLE RESECTION Right 01/30/2018   Procedure: RIGHT SHOULDER  ARTHROSCOPY WITH OPEN DISTAL CLAVICLE RESECTION, SUBACROMIAL DECOMPRESSION;  Surgeon: NNetta Cedars MD;  [place] [facility]  Service: Orthopedics;  Laterality: Right;   STERIOD INJECTION Right 01/30/2018   Procedure: STEROID INJECTION;  Surgeon: NNetta Cedars MD;  [place]  Service: Orthopedics;  Laterality: Right;   TOTAL SHOULDER REVISION Left 01/17/2017   Procedure: LEFT SHOULDER REVISION REVERSE TOTAL SHOULDER ARTHROPLASTY;  Surgeon: NNetta Cedars MD;  [place]  Service: Orthopedics;  Laterality: Left;   UMBILICAL HERNIA REPAIR N/A 08/31/2021   Procedure: HERNIA REPAIR UMBILICAL ADULT possible;  Surgeon: TDonnie Mesa MD;  [facility];  Service: General;  Laterality: N/A;   WRIST SURGERY Right 11/2014   Dr. GAmedeo Plenty   Current Medications: Current Meds  Medication Sig   albuterol (PROVENTIL) (2.5 MG/3ML) 0.083% nebulizer solution Take 2.5 mg by nebulization every 6 (six) hours as needed for wheezing or shortness of breath.   albuterol (VENTOLIN HFA) 108 (90 Base) MCG/ACT inhaler Inhale 2 puffs into the lungs every 6 (six) hours as needed for wheezing or shortness of  breath.   atorvastatin (LIPITOR) 20 MG tablet Take 1 tablet (20 mg total) by mouth every other day.   b complex vitamins tablet Take 1 tablet by mouth daily.   Budeson-Glycopyrrol-Formoterol (BREZTRI AEROSPHERE) 160-9-4.8 MCG/ACT AERO Inhale 1 puff into the lungs daily.   buPROPion (WELLBUTRIN XL) 150 MG 24 hr tablet Take 1 tablet (150 mg total) by mouth daily.   cetirizine (ZYRTEC) 10 MG tablet Take 10 mg by mouth daily as needed for allergies.    ciclopirox (PENLAC) 8 % solution Apply topically at bedtime. Apply over nail and surrounding skin. Apply daily over previous coat. After seven (7) days, may remove with alcohol and continue cycle.   esomeprazole (NEXIUM) 40 MG capsule Take 1 capsule (40 mg total) by mouth daily.   fluticasone (FLONASE) 50 MCG/ACT nasal spray Place 1 spray into both nostrils daily  as needed for allergies.   furosemide (LASIX) 20 MG tablet Take 20 mg by mouth daily.   glucose blood (PRODIGY NO CODING BLOOD GLUC) test strip Use up to 4 times per day as directed with glucometer.   Insulin Pen Needle 32G X 4 MM MISC For use with Lantus Solostar   LANTUS SOLOSTAR 100 UNIT/ML Solostar Pen Inject 60 Units into the skin at bedtime.   lisinopril (ZESTRIL) 2.5 MG tablet Take 1 tablet (2.5 mg total) by mouth at bedtime.   Multiple Vitamin (MULTIVITAMIN WITH MINERALS) TABS tablet Take 1 tablet by mouth at bedtime.   Omega-3 Fatty Acids (FISH OIL) 1000 MG CAPS Take 1,000 mg by mouth at bedtime.    Polyvinyl Alcohol-Povidone (REFRESH OP) Place 1 drop into both eyes daily as needed (dry eyes).   tacrolimus (PROTOPIC) 0.1 % ointment Apply 1 application topically daily.   tadalafil (CIALIS) 20 MG tablet Take 0.5-1 tablets (10-20 mg total) by mouth every other day as needed for erectile dysfunction.   tamsulosin (FLOMAX) 0.4 MG CAPS capsule Take 1 capsule (0.4 mg total) by mouth at bedtime.   Testosterone 30 MG/ACT SOLN APPLY 1-2 PUMPS DAILY. ALTERNATE TAKING ONE PUMP UNDER ONE ARM ONE DAY, TWO PUMPS THE NEXT DAY (ONE PUMP UNDER EACH ARM)   vitamin C (ASCORBIC ACID) 500 MG tablet Take 500 mg by mouth daily.     Allergies:   Methotrexate derivatives and Tramadol   Social History   Socioeconomic History   Marital status: Soil scientist    Spouse name: Webb Silversmith   Number of children: 2   Years of education: 12   Highest education level: 12th grade  Occupational History   Occupation: lineman    Comment: retired  Tobacco Use   Smoking status: Every Day    Packs/day: 0.75    Years: 43.00    Total pack years: 32.25    Types: Cigarettes   Smokeless tobacco: Never   Tobacco comments:    pt no longer taking chantix  Vaping Use   Vaping Use: Never used  Substance and Sexual Activity   Alcohol use: Yes    Alcohol/week: 9.0 standard drinks of alcohol    Types: 6 Cans of beer, 3 Shots  of liquor per week    Comment: beer or mixed drink    Drug use: No   Sexual activity: Yes  Other Topics Concern   Not on file  Social History Narrative   Lives with his girlfriend, Webb Silversmith. Retired. DOes a lot of yard work and handy work. Members of the camping club.   Social Determinants of Radio broadcast assistant  Strain: Low Risk  (08/05/2021)   Overall Financial Resource Strain (CARDIA)    Difficulty of Paying Living Expenses: Not hard at all  Food Insecurity: No Food Insecurity (08/05/2021)   Hunger Vital Sign    Worried About Running Out of Food in the Last Year: Never true    Ran Out of Food in the Last Year: Never true  Transportation Needs: No Transportation Needs (08/05/2021)   PRAPARE - Hydrologist (Medical): No    Lack of Transportation (Non-Medical): No  Physical Activity: Inactive (08/05/2021)   Exercise Vital Sign    Days of Exercise per Week: 0 days    Minutes of Exercise per Session: 0 min  Stress: No Stress Concern Present (08/05/2021)   [place]    Feeling of Stress : Only a little  Social Connections: Socially Integrated (08/05/2021)   Social Connection and Isolation Panel [NHANES]    Frequency of Communication with Friends and Family: More than three times a week    Frequency of Social Gatherings with Friends and Family: More than three times a week    Attends Religious Services: More than 4 times per year    Active Member of [employer] or Organizations: Yes    Attends Music therapist: More than 4 times per year    Marital Status: Married     Family History: The patient's family history includes Colon cancer in his father; Diabetes in his father and mother; Prostate cancer in his father. There is no history of Esophageal cancer, Rectal cancer, or Stomach cancer.  ROS:   Please see the history of present illness.    All other systems reviewed and are  negative.  EKGs/Labs/Other Studies Reviewed:    The following studies were reviewed today: EKG reveals sinus rhythm with nonspecific ST-T changes   Recent Labs: 04/13/2022: ALT 27; BUN 16; Creat 1.18; Hemoglobin 15.6; Magnesium 2.1; Platelets 191; Potassium 4.2; Sodium 142; TSH 3.07  Recent Lipid Panel    Component Value Date/Time   CHOL 160 04/13/2022 0934   TRIG 103 04/13/2022 0934   HDL 50 04/13/2022 0934   CHOLHDL 3.2 04/13/2022 0934   LDLCALC 90 04/13/2022 0934    Physical Exam:    VS:  BP 134/68   Pulse 89   Ht 6' (1.829 m)   Wt 206 lb 1.3 oz (93.5 kg)   SpO2 92%   BMI 27.95 kg/m     Wt Readings from Last 3 Encounters:  05/05/22 206 lb 1.3 oz (93.5 kg)  04/13/22 206 lb (93.4 kg)  12/08/21 200 lb (90.7 kg)     GEN: Patient is in no acute distress HEENT: Normal NECK: No JVD; No carotid bruits LYMPHATICS: No lymphadenopathy CARDIAC: S1 S2 regular, 2/6 systolic murmur at the apex. RESPIRATORY:  Clear to auscultation without rales, wheezing or rhonchi  ABDOMEN: Soft, non-tender, non-distended MUSCULOSKELETAL:  No edema; No deformity  SKIN: Warm and dry NEUROLOGIC:  Alert and oriented x 3 PSYCHIATRIC:  Normal affect    Signed, Jenean Lindau, MD  05/05/2022 10:20 AM    [place]

## 2022-05-05 NOTE — Discharge Summary (Signed)
This visit was accompanied by Jerl Santos.

## 2022-05-05 NOTE — Patient Instructions (Signed)
Medication Instructions:  Your physician recommends that you continue on your current medications as directed. Please refer to the Current Medication list given to you today.   *If you need a refill on your cardiac medications before your next appointment, please call your pharmacy*   Lab Work: Your physician recommends that you return for lab work in: 1 week prior to CT scan. We You can come Monday through Friday 8:30 am to 11:30 pm and 1:00 to 4:00, [address]. You do not need to make an appointment as the order has already been placed.    If you have labs (blood work) drawn today and your tests are completely normal, you will receive your results only by: [place] (if you have MyChart) OR A paper copy in the mail If you have any lab test that is abnormal or we need to change your treatment, we will call you to review the results.   Testing/Procedures:   Your cardiac CT will be scheduled at one of the below locations:   [facility] [address] [place], [place] [address] [phone]  If scheduled at [facility], please arrive at the [facility] and Children's Entrance (Entrance C2) of [facility] 30 minutes prior to test start time. You can use the FREE valet parking offered at entrance C (encouraged to control the heart rate for the test)  Proceed to the [facility] Radiology Department (first floor) to check-in and test prep.  All radiology patients and guests should use entrance C2 at [facility], accessed from [facility], even though the hospital's physical address listed is [address]     Please follow these instructions carefully (unless otherwise directed):  Hold all erectile dysfunction medications at least 3 days (72 hrs) prior to test. (Ie viagra, cialis, sildenafil, tadalafil, etc) We will administer nitroglycerin during this exam.   On the Night Before the Test: Be sure to Drink plenty of  water. Do not consume any caffeinated/decaffeinated beverages or chocolate 12 hours prior to your test. Do not take any antihistamines 12 hours prior to your test.  On the Day of the Test: Drink plenty of water until 1 hour prior to the test. Do not eat any food 1 hour prior to test. You may take your regular medications prior to the test.  Take metoprolol (Lopressor) two hours prior to test.       After the Test: Drink plenty of water. After receiving IV contrast, you may experience a mild flushed feeling. This is normal. On occasion, you may experience a mild rash up to 24 hours after the test. This is not dangerous. If this occurs, you can take Benadryl 25 mg and increase your fluid intake. If you experience trouble breathing, this can be serious. If it is severe call 911 IMMEDIATELY. If it is mild, please call our office. If you take any of these medications: Glipizide/Metformin, Avandament, Glucavance, please do not take 48 hours after completing test unless otherwise instructed.  We will call to schedule your test 2-4 weeks out understanding that some insurance companies will need an authorization prior to the service being performed.   For non-scheduling related questions, please contact the cardiac imaging nurse navigator should you have any questions/concerns: Marchia Bond, Cardiac Imaging Nurse Navigator Gordy Clement, Cardiac Imaging Nurse Navigator [facility] Direct Office Dial: [phone]   For scheduling needs, including cancellations and rescheduling, please call [place], [phone].   Your  next appointment:   9 month(s)  The format for your next appointment:   In Person  Provider:   Jyl Heinz, MD   Other Instructions Cardiac CT Angiogram A cardiac CT angiogram is a procedure to look at the heart and the area around the heart. It may be done to help find the cause of chest pains or other symptoms of heart disease. During  this procedure, a substance called contrast dye is injected into the blood vessels in the area to be checked. A large X-ray machine, called a CT scanner, then takes detailed pictures of the heart and the surrounding area. The procedure is also sometimes called a coronary CT angiogram, coronary artery scanning, or CTA. A cardiac CT angiogram allows the health care provider to see how well blood is flowing to and from the heart. The health care provider will be able to see if there are any problems, such as: Blockage or narrowing of the coronary arteries in the heart. Fluid around the heart. Signs of weakness or disease in the muscles, valves, and tissues of the heart. Tell a health care provider about: Any allergies you have. This is especially important if you have had a previous allergic reaction to contrast dye. All medicines you are taking, including vitamins, herbs, eye drops, creams, and over-the-counter medicines. Any blood disorders you have. Any surgeries you have had. Any medical conditions you have. Whether you are pregnant or may be pregnant. Any anxiety disorders, chronic pain, or other conditions you have that may increase your stress or prevent you from lying still. What are the risks? Generally, this is a safe procedure. However, problems may occur, including: Bleeding. Infection. Allergic reactions to medicines or dyes. Damage to other structures or organs. Kidney damage from the contrast dye that is used. Increased risk of cancer from radiation exposure. This risk is low. Talk with your health care provider about: The risks and benefits of testing. How you can receive the lowest dose of radiation. What happens before the procedure? Wear comfortable clothing and remove any jewelry, glasses, dentures, and hearing aids. Follow instructions from your health care provider about eating and drinking. This may include: For 12 hours before the procedure -- avoid caffeine. This  includes tea, coffee, soda, energy drinks, and diet pills. Drink plenty of water or other fluids that do not have caffeine in them. Being well hydrated can prevent complications. For 4-6 hours before the procedure -- stop eating and drinking. The contrast dye can cause nausea, but this is less likely if your stomach is empty. Ask your health care provider about changing or stopping your regular medicines. This is especially important if you are taking diabetes medicines, blood thinners, or medicines to treat problems with erections (erectile dysfunction). What happens during the procedure?  Hair on your chest may need to be removed so that small sticky patches called electrodes can be placed on your chest. These will transmit information that helps to monitor your heart during the procedure. An IV will be inserted into one of your veins. You might be given a medicine to control your heart rate during the procedure. This will help to ensure that good images are obtained. You will be asked to lie on an exam table. This table will slide in and out of the CT machine during the procedure. Contrast dye will be injected into the IV. You might feel warm, or you may get a metallic taste in your mouth. You will be given a medicine called  nitroglycerin. This will relax or dilate the arteries in your heart. The table that you are lying on will move into the CT machine tunnel for the scan. The person running the machine will give you instructions while the scans are being done. You may be asked to: Keep your arms above your head. Hold your breath. Stay very still, even if the table is moving. When the scanning is complete, you will be moved out of the machine. The IV will be removed. The procedure may vary among health care providers and hospitals. What can I expect after the procedure? After your procedure, it is common to have: A metallic taste in your mouth from the contrast dye. A feeling of warmth. A  headache from the nitroglycerin. Follow these instructions at home: Take over-the-counter and prescription medicines only as told by your health care provider. If you are told, drink enough fluid to keep your urine pale yellow. This will help to flush the contrast dye out of your body. Most people can return to their normal activities right after the procedure. Ask your health care provider what activities are safe for you. It is up to you to get the results of your procedure. Ask your health care provider, or the department that is doing the procedure, when your results will be ready. Keep all follow-up visits as told by your health care provider. This is important. Contact a health care provider if: You have any symptoms of allergy to the contrast dye. These include: Shortness of breath. Rash or hives. A racing heartbeat. Summary A cardiac CT angiogram is a procedure to look at the heart and the area around the heart. It may be done to help find the cause of chest pains or other symptoms of heart disease. During this procedure, a large X-ray machine, called a CT scanner, takes detailed pictures of the heart and the surrounding area after a contrast dye has been injected into blood vessels in the area. Ask your health care provider about changing or stopping your regular medicines before the procedure. This is especially important if you are taking diabetes medicines, blood thinners, or medicines to treat erectile dysfunction. If you are told, drink enough fluid to keep your urine pale yellow. This will help to flush the contrast dye out of your body. This information is not intended to replace advice given to you by your health care provider. Make sure you discuss any questions you have with your health care provider. Document Revised: 11/01/2018 Document Reviewed: 11/01/2018 Elsevier Patient Education  [place].

## 2022-05-07 ENCOUNTER — Telehealth (HOSPITAL_COMMUNITY): Payer: Self-pay | Admitting: Emergency Medicine

## 2022-05-07 DIAGNOSIS — R079 Chest pain, unspecified: Secondary | ICD-10-CM

## 2022-05-07 MED ORDER — METOPROLOL TARTRATE 100 MG PO TABS: 100.0000 mg | ORAL_TABLET | Freq: Once | ORAL | 0 refills | Status: DC

## 2022-05-07 NOTE — Telephone Encounter (Signed)
Reaching out to patient to offer assistance regarding upcoming cardiac imaging study; pt verbalizes understanding of appt date/time, parking situation and where to check in, pre-test NPO status and medications ordered, and verified current allergies; name and call back number provided for further questions should they arise Marchia Bond RN Navigator Cardiac Imaging [facility] [phone] office [phone] cell   Arrival [address] entrance  163m metoprolol tartrate Denies iv issues Holding lasix, cialis Aware contrast/nitro

## 2022-05-10 ENCOUNTER — Ambulatory Visit (HOSPITAL_COMMUNITY)
Admission: RE | Admit: 2022-05-10 | Discharge: 2022-05-10 | Disposition: A | Discharge status: Home / Self Care | Payer: Medicare Other | Source: Ambulatory Visit | Attending: Cardiology | Admitting: Cardiology

## 2022-05-10 ENCOUNTER — Other Ambulatory Visit: Payer: Self-pay | Admitting: Family Medicine

## 2022-05-10 DIAGNOSIS — R0609 Other forms of dyspnea: Secondary | ICD-10-CM | POA: Insufficient documentation

## 2022-05-10 DIAGNOSIS — R072 Precordial pain: Secondary | ICD-10-CM | POA: Diagnosis not present

## 2022-05-10 DIAGNOSIS — R079 Chest pain, unspecified: Secondary | ICD-10-CM | POA: Diagnosis not present

## 2022-05-10 MED ORDER — NITROGLYCERIN 0.4 MG SL SUBL
SUBLINGUAL_TABLET | SUBLINGUAL | Status: AC
  Filled 2022-05-10: qty 2

## 2022-05-10 MED ORDER — IOHEXOL 350 MG/ML SOLN
100.0000 mL | Freq: Once | INTRAVENOUS | Status: AC | PRN
  Administered 2022-05-10: 100 mL via INTRAVENOUS

## 2022-05-10 MED ORDER — NITROGLYCERIN 0.4 MG SL SUBL
0.8000 mg | SUBLINGUAL_TABLET | Freq: Once | SUBLINGUAL | Status: AC
  Administered 2022-05-10: 0.8 mg via SUBLINGUAL

## 2022-05-13 ENCOUNTER — Telehealth: Payer: Self-pay

## 2022-05-13 DIAGNOSIS — E782 Mixed hyperlipidemia: Secondary | ICD-10-CM

## 2022-05-13 MED ORDER — ATORVASTATIN CALCIUM 40 MG PO TABS: 40.0000 mg | ORAL_TABLET | ORAL | 3 refills | Status: DC

## 2022-05-13 NOTE — Telephone Encounter (Signed)
-----   Message from Jenean Lindau, MD sent at 05/12/2022  4:29 PM EST ----- Pulmonary issues to be managed by primary care.  Please let him know.  He has significant nonobstructive coronary artery disease.  Diet, exercise and double statin and liver lipid check in 6 weeks.  Copy primary Jenean Lindau, MD 05/12/2022 4:28 PM

## 2022-06-09 DIAGNOSIS — E782 Mixed hyperlipidemia: Secondary | ICD-10-CM | POA: Diagnosis not present

## 2022-06-09 DIAGNOSIS — B370 Candidal stomatitis: Secondary | ICD-10-CM | POA: Diagnosis not present

## 2022-06-09 DIAGNOSIS — G4733 Obstructive sleep apnea (adult) (pediatric): Secondary | ICD-10-CM | POA: Diagnosis not present

## 2022-06-09 DIAGNOSIS — R918 Other nonspecific abnormal finding of lung field: Secondary | ICD-10-CM | POA: Diagnosis not present

## 2022-06-09 DIAGNOSIS — J449 Chronic obstructive pulmonary disease, unspecified: Secondary | ICD-10-CM | POA: Diagnosis not present

## 2022-06-10 LAB — HEPATIC FUNCTION PANEL
ALT: 29 IU/L (ref 0–44)
AST: 25 IU/L (ref 0–40)
Albumin: 3.7 g/dL — ABNORMAL LOW (ref 3.8–4.8)
Alkaline Phosphatase: 93 IU/L (ref 44–121)
Bilirubin Total: 0.4 mg/dL (ref 0.0–1.2)
Bilirubin, Direct: 0.15 mg/dL (ref 0.00–0.40)
Total Protein: 6 g/dL (ref 6.0–8.5)

## 2022-06-10 LAB — LIPID PANEL
Chol/HDL Ratio: 3.3 ratio (ref 0.0–5.0)
Cholesterol, Total: 138 mg/dL (ref 100–199)
HDL: 42 mg/dL (ref >39)
LDL Chol Calc (NIH): 74 mg/dL (ref 0–99)
Triglycerides: 125 mg/dL (ref 0–149)
VLDL Cholesterol Cal: 22 mg/dL (ref 5–40)

## 2022-06-12 ENCOUNTER — Other Ambulatory Visit: Payer: Self-pay | Admitting: Medical-Surgical

## 2022-07-07 ENCOUNTER — Encounter: Payer: Self-pay | Admitting: Family Medicine

## 2022-07-10 ENCOUNTER — Other Ambulatory Visit: Payer: Self-pay | Admitting: Family Medicine

## 2022-07-10 DIAGNOSIS — J449 Chronic obstructive pulmonary disease, unspecified: Secondary | ICD-10-CM

## 2022-08-03 ENCOUNTER — Other Ambulatory Visit: Payer: Self-pay | Admitting: Family Medicine

## 2022-08-07 ENCOUNTER — Other Ambulatory Visit: Payer: Self-pay | Admitting: Family Medicine

## 2022-08-11 ENCOUNTER — Ambulatory Visit (INDEPENDENT_AMBULATORY_CARE_PROVIDER_SITE_OTHER): Payer: Medicare Other | Admitting: Family Medicine

## 2022-08-11 DIAGNOSIS — Z0000 Encounter for general adult medical examination without abnormal findings: Secondary | ICD-10-CM

## 2022-08-11 NOTE — Progress Notes (Signed)
MEDICARE ANNUAL WELLNESS VISIT  08/11/2022  Telephone Visit Disclaimer This Medicare AWV was conducted by telephone due to national recommendations for restrictions regarding the COVID-19 Pandemic (e.g. social distancing).  I verified, using two identifiers, that I am speaking with Frederick Guzman or their authorized healthcare agent. I discussed the limitations, risks, security, and privacy concerns of performing an evaluation and management service by telephone and the potential availability of an in-person appointment in the future. The patient expressed understanding and agreed to proceed.  Location of Patient: Home Location of Provider (nurse):  In the office.  Subjective:    Frederick Guzman is a 72 y.o. male patient of Frederick Coombe, DO who had a Medicare Annual Wellness Visit today via telephone. Frederick Guzman is Retired and lives with their partner. he has 2 children. he reports that he is socially active and does interact with friends/family regularly. he is moderately physically active and enjoys yard work, handy work and traveling in the RV.  Patient Care Team: Frederick Coombe, DO as PCP - General (Family Medicine) Early, Sung Amabile, NP as Nurse Practitioner (Nurse Practitioner)     08/11/2022    8:30 AM 09/04/2021    2:21 AM 09/03/2021    7:40 PM 08/31/2021    9:45 AM 08/05/2021    9:08 AM 12/30/2020    8:45 AM 08/04/2020    1:43 PM  Advanced Directives  Does Patient Have a Medical Advance Directive? Yes  No No Yes Yes Yes  Type of Advance Directive Living will    Healthcare Power of Frederick Guzman;Living will Living will;Healthcare Power of [employer] Power of [place];Living will  Does patient want to make changes to medical advance directive? No - Patient declined    No - Patient declined  No - Patient declined  Copy of Healthcare Power of Attorney in Chart?     No - copy requested No - copy requested No - copy requested  Would patient like information on creating a  medical advance directive?  No - Patient declined  No - Patient declined  No - Patient declined     Hospital Utilization Over the Past 12 Months: # of hospitalizations or ER visits: 1 # of surgeries: 1  Review of Systems    Patient reports that his overall health is unchanged compared to last year.  History obtained from chart review and the patient  Patient Reported Readings (BP, Pulse, CBG, Weight, etc) none  Pain Assessment Pain : No/denies pain     Current Medications & Allergies (verified) Allergies as of 08/11/2022       Reactions   Methotrexate Derivatives Nausea And Vomiting, Other (See Comments)   Hallucinations and aggressive behavior    Tramadol Nausea And Vomiting   Hallucinations and aggressive behavior         Medication List        Accurate as of Aug 11, 2022  8:44 AM. If you have any questions, ask your nurse or doctor.          STOP taking these medications    ciclopirox 8 % solution Commonly known as: Penlac Stopped by: Frederick Coombe, DO   metoprolol tartrate 100 MG tablet Commonly known as: LOPRESSOR Stopped by: Frederick Coombe, DO   Prodigy No Coding Blood Gluc test strip Generic drug: glucose blood Stopped by: Frederick Coombe, DO       TAKE these medications    albuterol 108 (90 Base) MCG/ACT inhaler Commonly known as: VENTOLIN HFA Inhale 2  puffs into the lungs every 6 (six) hours as needed for wheezing or shortness of breath.   albuterol (2.5 MG/3ML) 0.083% nebulizer solution Commonly known as: PROVENTIL Take 2.5 mg by nebulization every 6 (six) hours as needed for wheezing or shortness of breath.   ascorbic acid 500 MG tablet Commonly known as: VITAMIN C Take 500 mg by mouth daily.   atorvastatin 40 MG tablet Commonly known as: LIPITOR Take 1 tablet (40 mg total) by mouth every other day.   b complex vitamins tablet Take 1 tablet by mouth daily.   Breztri Aerosphere 160-9-4.8 MCG/ACT Aero Generic drug:  Budeson-Glycopyrrol-Formoterol Inhale 1 puff into the lungs daily.   buPROPion 150 MG 24 hr tablet Commonly known as: WELLBUTRIN XL Take 1 tablet (150 mg total) by mouth daily.   cetirizine 10 MG tablet Commonly known as: ZYRTEC Take 10 mg by mouth daily as needed for allergies.   esomeprazole 40 MG capsule Commonly known as: NEXIUM Take 1 capsule (40 mg total) by mouth daily.   Fish Oil 1000 MG Caps Take 1,000 mg by mouth at bedtime.   fluticasone 50 MCG/ACT nasal spray Commonly known as: FLONASE Place 1 spray into both nostrils daily as needed for allergies.   furosemide 20 MG tablet Commonly known as: LASIX Take 1 tablet (20 mg total) by mouth 2 (two) times daily.   Insulin Pen Needle 32G X 4 MM Misc For use with Lantus Solostar   Lantus SoloStar 100 UNIT/ML Solostar Pen Generic drug: insulin glargine Inject 60 Units into the skin at bedtime.   lisinopril 2.5 MG tablet Commonly known as: ZESTRIL Take 1 tablet (2.5 mg total) by mouth at bedtime.   multivitamin with minerals Tabs tablet Take 1 tablet by mouth at bedtime.   nitroGLYCERIN 0.4 MG SL tablet Commonly known as: NITROSTAT Place 1 tablet (0.4 mg total) under the tongue every 5 (five) minutes as needed for chest pain.   REFRESH OP Place 1 drop into both eyes daily as needed (dry eyes).   tacrolimus 0.1 % ointment Commonly known as: PROTOPIC Apply 1 application topically daily.   tadalafil 20 MG tablet Commonly known as: CIALIS Take 0.5-1 tablets (10-20 mg total) by mouth every other day as needed for erectile dysfunction.   tamsulosin 0.4 MG Caps capsule Commonly known as: FLOMAX Take 1 capsule (0.4 mg total) by mouth at bedtime.   Testosterone 30 MG/ACT Soln APPLY 1-2 PUMPS DAILY. 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   Arthritis    OA   Arthritis of wrist 05/28/2011   Biliary obstruction 09/04/2021   BPH (benign prostatic hyperplasia) 06/17/2011   BRONCHITIS, CHRONIC 03/10/2007   Qualifier: Diagnosis of   By: Craige Cotta MD, Vineet       Cancer [facility]) 08/2014   melanoma on R shoulder & back - [facility]    Combined forms of age-related cataract of right eye 09/21/2018   COPD (chronic obstructive pulmonary disease) (HCC)    COPD with exacerbation (HCC) 04/13/2021   Diverticulitis    Dyspnea    with exertion   Elevated liver enzymes 09/03/2021   Generalized abdominal pain    GERD (gastroesophageal reflux disease)    HTN (hypertension)    Hyperlipemia  Hyperlipidemia 08/07/2020   Hypogonadism male 06/17/2011   Long term use of drug 04/19/2017   Lymphocytic colitis 04/2018   MDD (major depressive disorder) (HCC) 04/30/2011   OBSTRUCTIVE SLEEP APNEA 03/10/2007   Qualifier: Diagnosis of   By: Craige Cotta MD, Vineet       Pain in joint of left shoulder 04/19/2017   Pain in right hand 08/04/2017   Pneumonia 06/2015, 10/2016   Positive depression screening 09/21/2016   Pulmonary nodules 10/23/2017   S/P laparoscopic cholecystectomy 09/03/2021   S/P shoulder replacement, left 01/30/2016   Status post shoulder surgery 08/09/2016   TOBACCO ABUSE 03/10/2007   Qualifier: Diagnosis of   By: Craige Cotta MD, Vineet       Transaminitis 09/03/2021   Uncontrolled type 2 diabetes mellitus with hyperglycemia, without long-term current use of insulin (HCC) 09/21/2016   Likely will need dual or triple therapy but will start with metformin alone in case of intolerance. Close follow-up with home blood sugar readings. ?steroid rol   Past Surgical History:  Procedure Laterality Date   carpel tunnel Bilateral    CHOLECYSTECTOMY N/A 08/31/2021   Procedure: LAPAROSCOPIC CHOLECYSTECTOMY AND UMBILICAL HERNIA REPAIR;  Surgeon: Manus Rudd, MD;  Location: [facility] ORS;  Service: General;   Laterality: N/A;   COLONOSCOPY W/ POLYPECTOMY     COLONOSCOPY W/ POLYPECTOMY     EXCISIONAL TOTAL SHOULDER ARTHROPLASTY WITH ANTIBIOTIC SPACER Left 08/09/2016   Procedure: LEFT SHOULDER HARDWARE REMOVAL, IRRIGATION AND DEBRIDEMENT, PLACEMENT OF ANTIBIOTIC SPACER;  Surgeon: Beverely Low, MD;  Location: [facility] OR;  Service: Orthopedics;  Laterality: Left;   IRRIGATION AND DEBRIDEMENT SHOULDER Left 03/31/2016   Procedure: IRRIGATION AND DEBRIDEMENT SHOULDER, deep cultures and polyethylene exchange;  Surgeon: Beverely Low, MD;  Location: [facility] OR;  Service: Orthopedics;  Laterality: Left;  requests   REVERSE SHOULDER ARTHROPLASTY Left 01/30/2016   Procedure: LEFT REVERSE SHOULDER ARTHROPLASTY;  Surgeon: Beverely Low, MD;  Location: [facility] OR;  Service: Orthopedics;  Laterality: Left;   ROTATOR CUFF REPAIR Left    SHOULDER ARTHROSCOPY WITH DISTAL CLAVICLE RESECTION Right 01/30/2018   Procedure: RIGHT SHOULDER ARTHROSCOPY WITH OPEN DISTAL CLAVICLE RESECTION, SUBACROMIAL DECOMPRESSION;  Surgeon: Beverely Low, MD;  Location: [facility] OR;  Service: Orthopedics;  Laterality: Right;   STERIOD INJECTION Right 01/30/2018   Procedure: STEROID INJECTION;  Surgeon: Beverely Low, MD;  Location: [facility] OR;  Service: Orthopedics;  Laterality: Right;   TOTAL SHOULDER REVISION Left 01/17/2017   Procedure: LEFT SHOULDER REVISION REVERSE TOTAL SHOULDER ARTHROPLASTY;  Surgeon: Beverely Low, MD;  Location: [facility] OR;  Service: Orthopedics;  Laterality: Left;   UMBILICAL HERNIA REPAIR N/A 08/31/2021   Procedure: HERNIA REPAIR UMBILICAL ADULT possible;  Surgeon: Manus Rudd, MD;  Location: [facility];  Service: General;  Laterality: N/A;   WRIST SURGERY Right 11/2014   Dr. Amanda Pea   Family History  Problem Relation Age of Onset   Diabetes Mother    Colon cancer Father    Prostate cancer Father    Diabetes Father    Esophageal cancer Neg Hx    Rectal cancer Neg Hx    Stomach cancer Neg Hx    Social History   Socioeconomic History    Marital status: Domestic Partner    Spouse name: Thurston Hole   Number of children: 2   Years of education: 12   Highest education level: 12th grade  Occupational History   Occupation: lineman    Comment: retired  Tobacco Use   Smoking status: Former    Packs/day: 0.75  Years: 43.00    Additional pack years: 0.00    Total pack years: 32.25    Types: Cigarettes    Quit date: 03/2022    Years since quitting: 0.3   Smokeless tobacco: Never   Tobacco comments:    pt no longer taking chantix  Vaping Use   Vaping Use: Never used  Substance and Sexual Activity   Alcohol use: Yes    Alcohol/week: 9.0 standard drinks of alcohol    Types: 6 Cans of beer, 3 Shots of liquor per week    Comment: beer or mixed drink    Drug use: No   Sexual activity: Yes  Other Topics Concern   Not on file  Social History Narrative   Lives with his girlfriend, Thurston Hole. Retired. DOes a lot of yard work, travelling in the RV and handy work. Members of the camping club.   Social Determinants of Health   Financial Resource Strain: Low Risk  (08/11/2022)   Overall Financial Resource Strain (CARDIA)    Difficulty of Paying Living Expenses: Not hard at all  Food Insecurity: No Food Insecurity (08/11/2022)   Hunger Vital Sign    Worried About Running Out of Food in the Last Year: Never true    Ran Out of Food in the Last Year: Never true  Transportation Needs: No Transportation Needs (08/11/2022)   PRAPARE - Administrator, Civil Service (Medical): No    Lack of Transportation (Non-Medical): No  Physical Activity: Inactive (08/11/2022)   Exercise Vital Sign    Days of Exercise per Week: 0 days    Minutes of Exercise per Session: 0 min  Stress: No Stress Concern Present (08/11/2022)   [employer] of Occupational Health - Occupational Stress Questionnaire    Feeling of Stress : Not at all  Social Connections: Socially Integrated (08/11/2022)   Social Connection and Isolation Panel [NHANES]     Frequency of Communication with Friends and Family: More than three times a week    Frequency of Social Gatherings with Friends and Family: Three times a week    Attends Religious Services: More than 4 times per year    Active Member of Clubs or Organizations: Yes    Attends Banker Meetings: More than 4 times per year    Marital Status: Living with partner    Activities of Daily Living    08/11/2022    8:36 AM 09/04/2021    1:50 AM  In your present state of health, do you have any difficulty performing the following activities:  Hearing? 0   Vision? 0   Difficulty concentrating or making decisions? 0   Walking or climbing stairs? 0   Dressing or bathing? 0   Doing errands, shopping? 0 0  Preparing Food and eating ? N   Using the Toilet? N   In the past six months, have you accidently leaked urine? N   Do you have problems with loss of bowel control? N   Managing your Medications? N   Managing your Finances? N   Housekeeping or managing your Housekeeping? N     Patient Education/ Literacy How often do you need to have someone help you when you read instructions, pamphlets, or other written materials from your doctor or pharmacy?: 1 - Never What is the last grade level you completed in school?: 12th grade  Exercise Current Exercise Habits: The patient does not participate in regular exercise at present, Exercise limited by: None identified  Diet  Patient reports consuming  2-3  meals a day and 0 snack(s) a day Patient reports that his primary diet is: Regular Patient reports that she does have regular access to food.   Depression Screen    08/11/2022    8:30 AM 04/13/2022    9:03 AM 12/08/2021    9:20 AM 08/05/2021    9:08 AM 03/31/2021    9:14 AM 08/04/2020    1:40 PM 07/23/2020    9:04 AM  PHQ 2/9 Scores  PHQ - 2 Score 1 6 0 0 4 2 2   PHQ- 9 Score  16 1  9 3       Fall Risk    08/11/2022    8:30 AM 04/13/2022    8:58 AM 08/05/2021    9:08 AM 03/31/2021     9:13 AM 09/24/2020    1:09 PM  Fall Risk   Falls in the past year? 0 0 0 0 0  Number falls in past yr: 0 0 0 0 0  Injury with Fall? 0 0 0 0 0  Risk for fall due to : No Fall Risks No Fall Risks No Fall Risks No Fall Risks No Fall Risks  Follow up Falls evaluation completed Falls evaluation completed Falls evaluation completed Falls evaluation completed Falls evaluation completed     Objective:  KAIDIN RUSHLOW seemed alert and oriented and he participated appropriately during our telephone visit.  Blood Pressure Weight BMI  BP Readings from Last 3 Encounters:  05/10/22 (!) 93/51  05/05/22 134/68  04/13/22 138/64   Wt Readings from Last 3 Encounters:  05/05/22 206 lb 1.3 oz (93.5 kg)  04/13/22 206 lb (93.4 kg)  12/08/21 200 lb (90.7 kg)   BMI Readings from Last 1 Encounters:  05/05/22 27.95 kg/m    *Unable to obtain current vital signs, weight, and BMI due to telephone visit type  Hearing/Vision  Teodoro did not seem to have difficulty with hearing/understanding during the telephone conversation Reports that he has had a formal eye exam by an eye care professional within the past year Reports that he has not had a formal hearing evaluation within the past year *Unable to fully assess hearing and vision during telephone visit type  Cognitive Function:    08/11/2022    8:37 AM 08/05/2021    9:14 AM 08/04/2020    1:49 PM 06/26/2018   11:32 AM  6CIT Screen  What Year? 0 points 0 points 0 points 0 points  What month? 0 points 0 points 0 points 0 points  What time? 0 points 0 points 0 points 0 points  Count back from 20 4 points 0 points 0 points 0 points  Months in reverse 0 points 0 points 0 points 0 points  Repeat phrase 0 points 0 points 0 points 0 points  Total Score 4 points 0 points 0 points 0 points   (Normal:0-7, Significant for Dysfunction: >8)  Normal Cognitive Function Screening: Yes   Immunization & Health Maintenance Record Immunization History  Administered  Date(s) Administered   Fluad Quad(high Dose 65+) 11/14/2018, 12/20/2021   Influenza Split 02/14/2012, 12/27/2012   Influenza, High Dose Seasonal PF 12/15/2016, 01/19/2018   Influenza, Seasonal, Injecte, Preservative Fre 11/27/2015   Influenza-Unspecified 02/14/2012, 12/27/2012, 12/19/2019   PFIZER(Purple Top)SARS-COV-2 Vaccination 05/13/2019, 09/04/2019, 01/02/2020, 01/02/2020, 01/15/2021   Pfizer Covid-19 Vaccine Bivalent Booster 59yrs & up 09/16/2021   Pneumococcal Conjugate-13 03/18/2015   Pneumococcal Polysaccharide-23 01/19/2008, 12/15/2016   Tdap 05/31/2011, 02/19/2019   Zoster Recombinat (Shingrix)  02/19/2019, 03/20/2019    Health Maintenance  Topic Date Due   COVID-19 Vaccine (7 - 2023-24 season) 08/27/2022 (Originally 11/20/2021)   HEMOGLOBIN A1C  10/12/2022   INFLUENZA VACCINE  10/21/2022   OPHTHALMOLOGY EXAM  11/26/2022   Diabetic kidney evaluation - Urine ACR  12/09/2022   Diabetic kidney evaluation - eGFR measurement  04/14/2023   FOOT EXAM  04/14/2023   Lung Cancer Screening  05/11/2023   Medicare Annual Wellness (AWV)  08/11/2023   COLONOSCOPY (Pts 45-9yrs Insurance coverage will need to be confirmed)  05/11/2025   DTaP/Tdap/Td (3 - Td or Tdap) 02/18/2029   Pneumonia Vaccine 15+ Years old  Completed   Hepatitis C Screening  Completed   Zoster Vaccines- Shingrix  Completed   HPV VACCINES  Aged Out       Assessment  This is a routine wellness examination for [employer].  Health Maintenance: Due or Overdue There are no preventive care reminders to display for this patient.   Frederick Guzman does not need a referral for [employer] Assistance: Care Management:   no Social Work:    no Prescription Assistance:  no Nutrition/Diabetes Education:  no   Plan:  Personalized Goals  Goals Addressed               This Visit's Progress     Patient Stated (pt-stated)        Patient stated that he would like to be move active.       Personalized  Health Maintenance & Screening Recommendations  There are no preventive care reminders to display for this patient.  Lung Cancer Screening Recommended: yes (Low Dose CT Chest recommended if Age 5-80 years, 30 pack-year currently smoking OR have quit w/in past 15 years) Hepatitis C Screening recommended: no HIV Screening recommended: no  Advanced Directives: Written information was not prepared per patient's request.  Referrals & Orders No orders of the defined types were placed in this encounter.   Follow-up Plan Follow-up with Frederick Coombe, DO as planned Medicare wellness visit in one year. AVS printed and mailed to the patient.   I have personally reviewed and noted the following in the patient's chart:   Medical and social history Use of alcohol, tobacco or illicit drugs  Current medications and supplements Functional ability and status Nutritional status Physical activity Advanced directives List of other physicians Hospitalizations, surgeries, and ER visits in previous 12 months Vitals Screenings to include cognitive, depression, and falls Referrals and appointments  In addition, I have reviewed and discussed with Frederick Guzman certain preventive protocols, quality metrics, and best practice recommendations. A written personalized care plan for preventive services as well as general preventive health recommendations is available and can be mailed to the patient at his request.      Modesto Charon, RN BSN  08/11/2022

## 2022-08-11 NOTE — Patient Instructions (Addendum)
MEDICARE ANNUAL WELLNESS VISIT Health Maintenance Summary and Written Plan of Care  Frederick Guzman ,  Thank you for allowing me to perform your Medicare Annual Wellness Visit and for your ongoing commitment to your health.   Health Maintenance & Immunization History Health Maintenance  Topic Date Due   COVID-19 Vaccine (7 - 2023-24 season) 08/27/2022 (Originally 11/20/2021)   HEMOGLOBIN A1C  10/12/2022   INFLUENZA VACCINE  10/21/2022   OPHTHALMOLOGY EXAM  11/26/2022   Diabetic kidney evaluation - Urine ACR  12/09/2022   Diabetic kidney evaluation - eGFR measurement  04/14/2023   FOOT EXAM  04/14/2023   Lung Cancer Screening  05/11/2023   Medicare Annual Wellness (AWV)  08/11/2023   COLONOSCOPY (Pts 45-108yrs Insurance coverage will need to be confirmed)  05/11/2025   DTaP/Tdap/Td (3 - Td or Tdap) 02/18/2029   Pneumonia Vaccine 71+ Years old  Completed   Hepatitis C Screening  Completed   Zoster Vaccines- Shingrix  Completed   HPV VACCINES  Aged Out   Immunization History  Administered Date(s) Administered   Fluad Quad(high Dose 65+) 11/14/2018, 12/20/2021   Influenza Split 02/14/2012, 12/27/2012   Influenza, High Dose Seasonal PF 12/15/2016, 01/19/2018   Influenza, Seasonal, Injecte, Preservative Fre 11/27/2015   Influenza-Unspecified 02/14/2012, 12/27/2012, 12/19/2019   PFIZER(Purple Top)SARS-COV-2 Vaccination 05/13/2019, 09/04/2019, 01/02/2020, 01/02/2020, 01/15/2021   Pfizer Covid-19 Vaccine Bivalent Booster 33yrs & up 09/16/2021   Pneumococcal Conjugate-13 03/18/2015   Pneumococcal Polysaccharide-23 01/19/2008, 12/15/2016   Tdap 05/31/2011, 02/19/2019   Zoster Recombinat (Shingrix) 02/19/2019, 03/20/2019    These are the patient goals that we discussed:  Goals Addressed               This Visit's Progress     Patient Stated (pt-stated)        Patient stated that he would like to be move active.         This is a list of Health Maintenance Items that are  overdue or due now: There are no preventive care reminders to display for this patient.    Orders/Referrals Placed Today: No orders of the defined types were placed in this encounter.  (Contact our referral department at [phone] if you have not spoken with someone about your referral appointment within the next 5 days)    Follow-up Plan Follow-up with Everrett Coombe, DO as planned Medicare wellness visit in one year. AVS printed and mailed to the patient.      Health Maintenance, Male Adopting a healthy lifestyle and getting preventive care are important in promoting health and wellness. Ask your health care provider about: The right schedule for you to have regular tests and exams. Things you can do on your own to prevent diseases and keep yourself healthy. What should I know about diet, weight, and exercise? Eat a healthy diet  Eat a diet that includes plenty of vegetables, fruits, low-fat dairy products, and lean protein. Do not eat a lot of foods that are high in solid fats, added sugars, or sodium. Maintain a healthy weight Body mass index (BMI) is a measurement that can be used to identify possible weight problems. It estimates body fat based on height and weight. Your health care provider can help determine your BMI and help you achieve or maintain a healthy weight. Get regular exercise Get regular exercise. This is one of the most important things you can do for your health. Most adults should: Exercise for at least 150 minutes each week. The exercise should increase your heart  rate and make you sweat (moderate-intensity exercise). Do strengthening exercises at least twice a week. This is in addition to the moderate-intensity exercise. Spend less time sitting. Even light physical activity can be beneficial. Watch cholesterol and blood lipids Have your blood tested for lipids and cholesterol at 73 years of age, then have this test every 5 years. You may need to have  your cholesterol levels checked more often if: Your lipid or cholesterol levels are high. You are older than 73 years of age. You are at high risk for heart disease. What should I know about cancer screening? Many types of cancers can be detected early and may often be prevented. Depending on your health history and family history, you may need to have cancer screening at various ages. This may include screening for: Colorectal cancer. Prostate cancer. Skin cancer. Lung cancer. What should I know about heart disease, diabetes, and high blood pressure? Blood pressure and heart disease High blood pressure causes heart disease and increases the risk of stroke. This is more likely to develop in people who have high blood pressure readings or are overweight. Talk with your health care provider about your target blood pressure readings. Have your blood pressure checked: Every 3-5 years if you are 91-53 years of age. Every year if you are 31 years old or older. If you are between the ages of 68 and 60 and are a current or former smoker, ask your health care provider if you should have a one-time screening for abdominal aortic aneurysm (AAA). Diabetes Have regular diabetes screenings. This checks your fasting blood sugar level. Have the screening done: Once every three years after age 75 if you are at a normal weight and have a low risk for diabetes. More often and at a younger age if you are overweight or have a high risk for diabetes. What should I know about preventing infection? Hepatitis B If you have a higher risk for hepatitis B, you should be screened for this virus. Talk with your health care provider to find out if you are at risk for hepatitis B infection. Hepatitis C Blood testing is recommended for: Everyone born from [birth year] through 1966. Anyone with known risk factors for hepatitis C. Sexually transmitted infections (STIs) You should be screened each year for STIs, including  gonorrhea and chlamydia, if: You are sexually active and are younger than 73 years of age. You are older than 73 years of age and your health care provider tells you that you are at risk for this type of infection. Your sexual activity has changed since you were last screened, and you are at increased risk for chlamydia or gonorrhea. Ask your health care provider if you are at risk. Ask your health care provider about whether you are at high risk for HIV. Your health care provider may recommend a prescription medicine to help prevent HIV infection. If you choose to take medicine to prevent HIV, you should first get tested for HIV. You should then be tested every 3 months for as long as you are taking the medicine. Follow these instructions at home: Alcohol use Do not drink alcohol if your health care provider tells you not to drink. If you drink alcohol: Limit how much you have to 0-2 drinks a day. Know how much alcohol is in your drink. In the [place] one drink equals one 12 oz bottle of beer (355 mL), one 5 oz glass of wine (148 mL), or one 1 oz glass of hard  liquor (44 mL). Lifestyle Do not use any products that contain nicotine or tobacco. These products include cigarettes, chewing tobacco, and vaping devices, such as e-cigarettes. If you need help quitting, ask your health care provider. Do not use street drugs. Do not share needles. Ask your health care provider for help if you need support or information about quitting drugs. General instructions Schedule regular health, dental, and eye exams. Stay current with your vaccines. Tell your health care provider if: You often feel depressed. You have ever been abused or do not feel safe at home. Summary Adopting a healthy lifestyle and getting preventive care are important in promoting health and wellness. Follow your health care provider's instructions about healthy diet, exercising, and getting tested or screened for diseases. Follow your  health care provider's instructions on monitoring your cholesterol and blood pressure. This information is not intended to replace advice given to you by your health care provider. Make sure you discuss any questions you have with your health care provider. Document Revised: 07/28/2020 Document Reviewed: 07/28/2020 Elsevier Patient Education  2023 [employer].

## 2022-08-12 ENCOUNTER — Ambulatory Visit (INDEPENDENT_AMBULATORY_CARE_PROVIDER_SITE_OTHER): Payer: Medicare Other | Admitting: Family Medicine

## 2022-08-12 ENCOUNTER — Encounter: Payer: Self-pay | Admitting: Family Medicine

## 2022-08-12 VITALS — BP 119/64 | HR 80 | Ht 72.0 in | Wt 214.0 lb

## 2022-08-12 DIAGNOSIS — E113213 Type 2 diabetes mellitus with mild nonproliferative diabetic retinopathy with macular edema, bilateral: Secondary | ICD-10-CM | POA: Diagnosis not present

## 2022-08-12 DIAGNOSIS — R600 Localized edema: Secondary | ICD-10-CM | POA: Insufficient documentation

## 2022-08-12 DIAGNOSIS — Z794 Long term (current) use of insulin: Secondary | ICD-10-CM | POA: Diagnosis not present

## 2022-08-12 DIAGNOSIS — E291 Testicular hypofunction: Secondary | ICD-10-CM

## 2022-08-12 DIAGNOSIS — I10 Essential (primary) hypertension: Secondary | ICD-10-CM

## 2022-08-12 LAB — POCT GLYCOSYLATED HEMOGLOBIN (HGB A1C): HbA1c, POC (controlled diabetic range): 7.8 % — AB (ref 0.0–7.0)

## 2022-08-12 MED ORDER — TESTOSTERONE 30 MG/ACT TD SOLN: TRANSDERMAL | 2 refills | Status: DC

## 2022-08-12 MED ORDER — SEMAGLUTIDE (1 MG/DOSE) 4 MG/3ML ~~LOC~~ SOPN: 1.0000 mg | PEN_INJECTOR | SUBCUTANEOUS | 1 refills | Status: DC

## 2022-08-12 MED ORDER — TORSEMIDE 100 MG PO TABS: 100.0000 mg | ORAL_TABLET | Freq: Every day | ORAL | 1 refills | Status: DC

## 2022-08-12 MED ORDER — OZEMPIC (0.25 OR 0.5 MG/DOSE) 2 MG/3ML ~~LOC~~ SOPN: PEN_INJECTOR | SUBCUTANEOUS | 1 refills | Status: DC

## 2022-08-12 NOTE — Assessment & Plan Note (Signed)
Blood sugars have been elevated recently and A1c has increased.  Continue insulin and current strength however we will split into 30 units every morning with 25 units nightly.  Adding Ozempic with titration as tolerated.

## 2022-08-12 NOTE — Assessment & Plan Note (Signed)
Doing pretty well with topical testosterone.  Will plan to continue this at current strength.

## 2022-08-12 NOTE — Assessment & Plan Note (Signed)
Furosemide does not seem to be quite as effective as it was initially.  Changed to torsemide at 100 mg daily.  He will let me know if this is not effective.  Continued elevation of legs as well as compression stockings encouraged.

## 2022-08-12 NOTE — Assessment & Plan Note (Signed)
Blood pressure remains well-controlled.  He will continue current medications for management of hypertension. 

## 2022-08-12 NOTE — Progress Notes (Signed)
Frederick Guzman - 72 y.o. male MRN [id number]  Date of birth: [birth year]  Subjective Chief Complaint  Patient presents with   Diabetes    HPI Frederick Guzman is 72 y.o. male here today for follow up visit.   He reports that he doing ok.   He continues on lantus for management of diabetes.  Currently doing 50 units, decreased from 60 units. He is having some increased episodes of hyperglycemia.  Denies side effects or symptoms of hypoglycemia.  No further episodes of hypoglycemia at night. He does reports feeling fatigued.   Continues to have some swelling in his legs.  He is taking lasix and reports using compression socks some.  He had CT of the coronary arteries with his pulmonologist in February.  Mild cardiomegaly noted.  Pulmonary fibrosis noted.  He is seeing pulmonology.  He is compliant with inhalers.  No increased dyspnea.    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   Arthritis    OA   Arthritis of wrist 05/28/2011   Biliary obstruction 09/04/2021   BPH (benign prostatic hyperplasia) 06/17/2011   BRONCHITIS, CHRONIC 03/10/2007   Qualifier: Diagnosis of   By: Craige Cotta MD, Vineet       Cancer [facility]) 08/2014   melanoma on R shoulder & back - [facility]    Combined forms of age-related cataract of right eye 09/21/2018   COPD (chronic obstructive pulmonary disease) (HCC)    COPD with exacerbation (HCC) 04/13/2021   Diverticulitis    Dyspnea    with exertion   Elevated liver enzymes 09/03/2021   Generalized abdominal pain    GERD (gastroesophageal reflux disease)    HTN  (hypertension)    Hyperlipemia    Hyperlipidemia 08/07/2020   Hypogonadism male 06/17/2011   Long term use of drug 04/19/2017   Lymphocytic colitis 04/2018   MDD (major depressive disorder) (HCC) 04/30/2011   OBSTRUCTIVE SLEEP APNEA 03/10/2007   Qualifier: Diagnosis of   By: Craige Cotta MD, Vineet       Pain in joint of left shoulder 04/19/2017   Pain in right hand 08/04/2017   Pneumonia 06/2015, 10/2016   Positive depression screening 09/21/2016   Pulmonary nodules 10/23/2017   S/P laparoscopic cholecystectomy 09/03/2021   S/P shoulder replacement, left 01/30/2016   Status post shoulder surgery 08/09/2016   TOBACCO ABUSE 03/10/2007   Qualifier: Diagnosis of   By: Craige Cotta MD, Vineet       Transaminitis 09/03/2021   Uncontrolled type 2 diabetes mellitus with hyperglycemia, without long-term current use of insulin (HCC) 09/21/2016   Likely will need dual or triple therapy but will start with metformin alone in case of intolerance. Close follow-up with home blood sugar readings. ?steroid rol    Past Surgical History:  Procedure Laterality Date   carpel tunnel Bilateral    CHOLECYSTECTOMY N/A 08/31/2021   Procedure: LAPAROSCOPIC CHOLECYSTECTOMY AND UMBILICAL HERNIA REPAIR;  Surgeon: Manus Rudd, MD;  Location: [facility];  Service: General;  Laterality: N/A;   COLONOSCOPY W/ POLYPECTOMY     COLONOSCOPY W/ POLYPECTOMY  EXCISIONAL TOTAL SHOULDER ARTHROPLASTY WITH ANTIBIOTIC SPACER Left 08/09/2016   Procedure: LEFT SHOULDER HARDWARE REMOVAL, IRRIGATION AND DEBRIDEMENT, PLACEMENT OF ANTIBIOTIC SPACER;  Surgeon: Beverely Low, MD;  Location: [facility] OR;  Service: Orthopedics;  Laterality: Left;   IRRIGATION AND DEBRIDEMENT SHOULDER Left 03/31/2016   Procedure: IRRIGATION AND DEBRIDEMENT SHOULDER, deep cultures and polyethylene exchange;  Surgeon: Beverely Low, MD;  Location: [facility] OR;  Service: Orthopedics;  Laterality: Left;  requests   REVERSE SHOULDER ARTHROPLASTY Left 01/30/2016   Procedure: LEFT  REVERSE SHOULDER ARTHROPLASTY;  Surgeon: Beverely Low, MD;  Location: [facility] OR;  Service: Orthopedics;  Laterality: Left;   ROTATOR CUFF REPAIR Left    SHOULDER ARTHROSCOPY WITH DISTAL CLAVICLE RESECTION Right 01/30/2018   Procedure: RIGHT SHOULDER ARTHROSCOPY WITH OPEN DISTAL CLAVICLE RESECTION, SUBACROMIAL DECOMPRESSION;  Surgeon: Beverely Low, MD;  Location: [facility] OR;  Service: Orthopedics;  Laterality: Right;   STERIOD INJECTION Right 01/30/2018   Procedure: STEROID INJECTION;  Surgeon: Beverely Low, MD;  Location: [facility] OR;  Service: Orthopedics;  Laterality: Right;   TOTAL SHOULDER REVISION Left 01/17/2017   Procedure: LEFT SHOULDER REVISION REVERSE TOTAL SHOULDER ARTHROPLASTY;  Surgeon: Beverely Low, MD;  Location: [facility] OR;  Service: Orthopedics;  Laterality: Left;   UMBILICAL HERNIA REPAIR N/A 08/31/2021   Procedure: HERNIA REPAIR UMBILICAL ADULT possible;  Surgeon: Manus Rudd, MD;  Location: [facility];  Service: General;  Laterality: N/A;   WRIST SURGERY Right 11/2014   Dr. Amanda Pea    Social History   Socioeconomic History   Marital status: Domestic Partner    Spouse name: Frederick Guzman   Number of children: 2   Years of education: 12   Highest education level: 12th grade  Occupational History   Occupation: lineman    Comment: retired  Tobacco Use   Smoking status: Former    Packs/day: 0.75    Years: 43.00    Additional pack years: 0.00    Total pack years: 32.25    Types: Cigarettes    Quit date: 03/2022    Years since quitting: 0.3   Smokeless tobacco: Never   Tobacco comments:    pt no longer taking chantix  Vaping Use   Vaping Use: Never used  Substance and Sexual Activity   Alcohol use: Yes    Alcohol/week: 9.0 standard drinks of alcohol    Types: 6 Cans of beer, 3 Shots of liquor per week    Comment: beer or mixed drink    Drug use: No   Sexual activity: Yes  Other Topics Concern   Not on file  Social History Narrative   Lives with his girlfriend, Frederick Guzman. Retired. DOes a  lot of yard work, travelling in the RV and handy work. Members of the camping club.   Social Determinants of Health   Financial Resource Strain: Low Risk  (08/11/2022)   Overall Financial Resource Strain (CARDIA)    Difficulty of Paying Living Expenses: Not hard at all  Food Insecurity: No Food Insecurity (08/11/2022)   Hunger Vital Sign    Worried About Running Out of Food in the Last Year: Never true    Ran Out of Food in the Last Year: Never true  Transportation Needs: No Transportation Needs (08/11/2022)   PRAPARE - Administrator, Civil Service (Medical): No    Lack of Transportation (Non-Medical): No  Physical Activity: Inactive (08/11/2022)   Exercise Vital Sign    Days of Exercise per Week: 0 days    Minutes of Exercise per Session: 0  min  Stress: No Stress Concern Present (08/11/2022)   [employer] of Occupational Health - Occupational Stress Questionnaire    Feeling of Stress : Not at all  Social Connections: Socially Integrated (08/11/2022)   Social Connection and Isolation Panel [NHANES]    Frequency of Communication with Friends and Family: More than three times a week    Frequency of Social Gatherings with Friends and Family: Three times a week    Attends Religious Services: More than 4 times per year    Active Member of Clubs or Organizations: Yes    Attends Engineer, structural: More than 4 times per year    Marital Status: Living with partner    Family History  Problem Relation Age of Onset   Diabetes Mother    Colon cancer Father    Prostate cancer Father    Diabetes Father    Esophageal cancer Neg Hx    Rectal cancer Neg Hx    Stomach cancer Neg Hx     Health Maintenance  Topic Date Due   COVID-19 Vaccine (7 - 2023-24 season) 08/27/2022 (Originally 11/20/2021)   INFLUENZA VACCINE  10/21/2022   OPHTHALMOLOGY EXAM  11/26/2022   Diabetic kidney evaluation - Urine ACR  12/09/2022   HEMOGLOBIN A1C  02/12/2023   Diabetic kidney  evaluation - eGFR measurement  04/14/2023   FOOT EXAM  04/14/2023   Lung Cancer Screening  05/11/2023   Medicare Annual Wellness (AWV)  08/11/2023   Colonoscopy  05/11/2025   DTaP/Tdap/Td (3 - Td or Tdap) 02/18/2029   Pneumonia Vaccine 41+ Years old  Completed   Hepatitis C Screening  Completed   Zoster Vaccines- Shingrix  Completed   HPV VACCINES  Aged Out     ----------------------------------------------------------------------------------------------------------------------------------------------------------------------------------------------------------------- Physical Exam BP 119/64 (BP Location: Left Arm, Patient Position: Sitting, Cuff Size: Large)   Pulse 80   Ht 6' (1.829 m)   Wt 214 lb (97.1 kg)   SpO2 92%   BMI 29.02 kg/m   Physical Exam Constitutional:      Appearance: Normal appearance.  HENT:     Head: Normocephalic and atraumatic.  Eyes:     General: No scleral icterus. Cardiovascular:     Rate and Rhythm: Normal rate and regular rhythm.  Pulmonary:     Effort: Pulmonary effort is normal.     Breath sounds: Normal breath sounds.  Musculoskeletal:     Right lower leg: Edema present.     Left lower leg: Edema present.  Neurological:     Mental Status: He is alert.  Psychiatric:        Mood and Affect: Mood normal.        Behavior: Behavior normal.     ------------------------------------------------------------------------------------------------------------------------------------------------------------------------------------------------------------------- Assessment and Plan  HTN (hypertension) Blood pressure remains well-controlled.  He will continue current medications for management of hypertension.  Lower extremity edema Furosemide does not seem to be quite as effective as it was initially.  Changed to torsemide at 100 mg daily.  He will let me know if this is not effective.  Continued elevation of legs as well as compression stockings  encouraged.  COPD (chronic obstructive pulmonary disease) (HCC) Followed by pulmonology.  Stable at this time.  He has quit smoking over the past few months.  Hypogonadism male Doing pretty well with topical testosterone.  Will plan to continue this at current strength.  Uncontrolled type 2 diabetes mellitus with hyperglycemia, without long-term current use of insulin (HCC) Blood sugars have been elevated recently and  A1c has increased.  Continue insulin and current strength however we will split into 30 units every morning with 25 units nightly.  Adding Ozempic with titration as tolerated.   Meds ordered this encounter  Medications   Semaglutide,0.25 or 0.5MG /DOS, (OZEMPIC, 0.25 OR 0.5 MG/DOSE,) 2 MG/3ML SOPN    Sig: Inject 0.25mg  weekly x4 weeks then increase to 0.5mg  weekly x4 weeks, then increase to 1mg  weekly    Dispense:  3 mL    Refill:  1   Semaglutide, 1 MG/DOSE, 4 MG/3ML SOPN    Sig: Inject 1 mg as directed once a week.    Dispense:  9 mL    Refill:  1   torsemide (DEMADEX) 100 MG tablet    Sig: Take 1 tablet (100 mg total) by mouth daily.    Dispense:  90 tablet    Refill:  1   Testosterone 30 MG/ACT SOLN    Sig: APPLY 1-2 PUMPS DAILY. ALTERNATE TAKING ONE PUMP UNDER ONE ARM ONE DAY, TWO PUMPS THE NEXT DAY (ONE PUMP UNDER EACH ARM)    Dispense:  90 mL    Refill:  2    Return in about 3 months (around 11/12/2022) for f/u T2DM.    This visit occurred during the SARS-CoV-2 public health emergency.  Safety protocols were in place, including screening questions prior to the visit, additional usage of staff PPE, and extensive cleaning of exam room while observing appropriate contact time as indicated for disinfecting solutions.

## 2022-08-12 NOTE — Patient Instructions (Signed)
Start Ozempic: Take 0.25mg  once per week for 4 weeks then increase to 0.5mg  once per week for 4 weeks then increase to 1mg  once per week.   Split Lantus- 30 units in the morning and 25 units at bedtime.   Stop furosemide.  Start torsemide to replace this.

## 2022-08-12 NOTE — Assessment & Plan Note (Signed)
Followed by pulmonology.  Stable at this time.  He has quit smoking over the past few months.

## 2022-08-13 ENCOUNTER — Encounter: Payer: Self-pay | Admitting: Family Medicine

## 2022-08-13 DIAGNOSIS — E1165 Type 2 diabetes mellitus with hyperglycemia: Secondary | ICD-10-CM

## 2022-08-13 LAB — BASIC METABOLIC PANEL
BUN: 10 mg/dL (ref 7–25)
CO2: 29 mmol/L (ref 20–32)
Calcium: 8.8 mg/dL (ref 8.6–10.3)
Chloride: 103 mmol/L (ref 98–110)
Creat: 1.2 mg/dL (ref 0.70–1.28)
Glucose, Bld: 132 mg/dL — ABNORMAL HIGH (ref 65–99)
Potassium: 3.9 mmol/L (ref 3.5–5.3)
Sodium: 140 mmol/L (ref 135–146)

## 2022-08-17 ENCOUNTER — Other Ambulatory Visit: Payer: Self-pay | Admitting: Family Medicine

## 2022-08-17 ENCOUNTER — Telehealth: Payer: Self-pay

## 2022-08-17 DIAGNOSIS — I10 Essential (primary) hypertension: Secondary | ICD-10-CM

## 2022-08-17 NOTE — Progress Notes (Signed)
   Care Guide Note  08/17/2022 Name: [name] MRN: [id number] DOB: [birth year]  Referred by: Everrett Coombe, DO Reason for referral : Care Coordination (Outreach to schedule with Pharm d )   Frederick Guzman is a 72 y.o. year old male who is a primary care patient of Everrett Coombe, DO. [name] was referred to the pharmacist for assistance related to DM.    Successful contact was made with the patient to discuss pharmacy services including being ready for the pharmacist to call at least 5 minutes before the scheduled appointment time, to have medication bottles and any blood sugar or blood pressure readings ready for review. The patient agreed to meet with the pharmacist via with the pharmacist via telephone visit on (date/time).  08/18/2022  Penne Lash, RMA Care Guide [facility]  [place], [place] [address] Direct Dial: [phone] [email or web address]

## 2022-08-17 NOTE — Progress Notes (Signed)
   Care Guide Note  08/17/2022 Name: Frederick Guzman MRN: [id number] DOB: 05/23/1950  Referred by: Everrett Coombe, DO Reason for referral : Care Coordination (Outreach to schedule with Pharm d )   Frederick Guzman is a 72 y.o. year old male who is a primary care patient of Everrett Coombe, DO. Frederick Guzman was referred to the pharmacist for assistance related to DM.    An unsuccessful telephone outreach was attempted today to contact the patient who was referred to the pharmacy team for assistance with medication assistance. Additional attempts will be made to contact the patient.   Penne Lash, RMA Care Guide [facility]  [place], [place] [address] Direct Dial: [phone] [email or web address]

## 2022-08-18 ENCOUNTER — Other Ambulatory Visit: Payer: Medicare Other

## 2022-08-18 DIAGNOSIS — M24131 Other articular cartilage disorders, right wrist: Secondary | ICD-10-CM | POA: Diagnosis not present

## 2022-08-18 DIAGNOSIS — M13831 Other specified arthritis, right wrist: Secondary | ICD-10-CM | POA: Diagnosis not present

## 2022-08-18 DIAGNOSIS — M24139 Other articular cartilage disorders, unspecified wrist: Secondary | ICD-10-CM | POA: Diagnosis not present

## 2022-08-18 DIAGNOSIS — M25531 Pain in right wrist: Secondary | ICD-10-CM | POA: Diagnosis not present

## 2022-08-18 NOTE — Progress Notes (Signed)
08/18/2022 Name: Frederick Guzman MRN: [id number] DOB: Nov 05, 1950  Chief Complaint  Patient presents with   Medication Management   Frederick Guzman is a 72 y.o. year old male who presented for a telephone visit.   They were referred to the pharmacist by their PCP for assistance in managing medication access.   Patient is participating in a Managed Medicaid Plan:  No  Subjective:  Care Team: Primary Care Provider: Everrett Coombe, DO ; Next Scheduled Visit: 12/01/22  Medication Access/Adherence  Current Pharmacy:  [facility] - [place], [place] - [address] [place] [address] Phone: [phone] Fax: [phone]  [facility] [id number] - [place], [place] - [address] [place] [address] Phone: [phone] Fax: [phone]  -Patient reports affordability concerns with their medications: Yes  -Patient reports access/transportation concerns to their pharmacy: No  -Patient reports adherence concerns with their medications:  Yes  Has not been able to pick up or start Ozempic due to >$300 copay.  Patient has also only been using topical testosterone twice weekly to make last longer due to cost.  Diabetes: Current medications: Lantus 30 units in the morning and 30 units at bedtime -Recent increase in A1c, so PCP wanting to initiate Ozempic; but this is not affordable on the patient's insurance -Patient previously taking all of lantus as one daily dose, but Dr. Ashley Royalty instructed to split dose.  He has been doing this 4 days only now. -Using CGM for BG monitoring  Objective: Lab Results  Component Value Date   HGBA1C 7.8 (A) 08/12/2022   Lab Results  Component Value Date   CREATININE 1.20 08/12/2022   BUN 10 08/12/2022   NA 140 08/12/2022   K 3.9 08/12/2022   CL 103 08/12/2022   CO2 29 08/12/2022    Lab Results  Component Value Date   CHOL 138 06/09/2022   HDL 42  06/09/2022   LDLCALC 74 06/09/2022   TRIG 125 06/09/2022   CHOLHDL 3.3 06/09/2022    Medications Reviewed Today     Reviewed by Lenna Gilford, RPH (Pharmacist) on 08/18/22 at 1524  Med List Status: <None>   Medication Order Taking? Sig Documenting Provider Last Dose Status Informant  albuterol (PROVENTIL) (2.5 MG/3ML) 0.083% nebulizer solution [id number] Yes Take 2.5 mg by nebulization every 6 (six) hours as needed for wheezing or shortness of breath. [provider] Taking Active Self  albuterol (VENTOLIN HFA) 108 (90 Base) MCG/ACT inhaler [id number] Yes Inhale 2 puffs into the lungs every 6 (six) hours as needed for wheezing or shortness of breath. [provider] Taking Active Self  atorvastatin (LIPITOR) 40 MG tablet [id number] Yes Take 1 tablet (40 mg total) by mouth every other day. Revankar, Aundra Dubin, MD Taking Active   b complex vitamins tablet [id number] Yes Take 1 tablet by mouth daily. [provider] Taking Active Self  Budeson-Glycopyrrol-Formoterol (BREZTRI AEROSPHERE) 160-9-4.8 MCG/ACT AERO [id number] Yes Inhale 1 puff into the lungs daily. [provider] Taking Active Self  buPROPion (WELLBUTRIN XL) 150 MG 24 hr tablet [id number] Yes Take 1 tablet (150 mg total) by mouth daily. Everrett Coombe, DO Taking Active   cetirizine (ZYRTEC) 10 MG tablet [id number] Yes Take 10 mg by mouth daily as needed for allergies.  [provider] Taking Active Self           Med Note (COFFELL, Foye Spurling Aug 31, 2021 11:08  AM)    esomeprazole (NEXIUM) 40 MG capsule [id number] Yes Take 1 capsule (40 mg total) by mouth daily. Everrett Coombe, DO Taking Active   fluticasone (FLONASE) 50 MCG/ACT nasal spray [id number] Yes Place 1 spray into both nostrils daily as needed for allergies. [provider] Taking Active Self           Med Note Sueanne Margarita Jan 27, 2016  3:14 PM)    Insulin Pen Needle 32G X 4 MM MISC [id number] Yes For use with  Lantus Solostar Carlis Stable, [place] Taking Active Self  LANTUS SOLOSTAR 100 UNIT/ML Solostar Pen [id number] Yes Inject 60 Units into the skin at bedtime. Everrett Coombe, DO Taking Active            Med Note Littie Deeds, Kairee Isa A   Wed Aug 18, 2022  3:17 PM) Doing 30 units in the morning and 30 at night  lisinopril (ZESTRIL) 2.5 MG tablet [id number] Yes Take 1 tablet (2.5 mg total) by mouth at bedtime. Everrett Coombe, DO Taking Active   Multiple Vitamin (MULTIVITAMIN WITH MINERALS) TABS tablet [id number] Yes Take 1 tablet by mouth at bedtime. [provider] Taking Active Self  nitroGLYCERIN (NITROSTAT) 0.4 MG SL tablet [id number]  Place 1 tablet (0.4 mg total) under the tongue every 5 (five) minutes as needed for chest pain.  Patient not taking: Reported on 08/11/2022   RevankarAundra Dubin, MD  Expired 08/03/22 2359   Omega-3 Fatty Acids (FISH OIL) 1000 MG CAPS [id number] Yes Take 1,000 mg by mouth at bedtime.  [provider] Taking Active Self  Polyvinyl Alcohol-Povidone (REFRESH OP) [id number] Yes Place 1 drop into both eyes daily as needed (dry eyes). [provider] Taking Active Self  Semaglutide, 1 MG/DOSE, 4 MG/3ML SOPN [id number] No Inject 1 mg as directed once a week.  Patient not taking: Reported on 08/18/2022   Everrett Coombe, DO Not Taking Active   Semaglutide,0.25 or 0.5MG /DOS, (OZEMPIC, 0.25 OR 0.5 MG/DOSE,) 2 MG/3ML SOPN [id number] No Inject 0.25mg  weekly x4 weeks then increase to 0.5mg  weekly x4 weeks, then increase to 1mg  weekly  Patient not taking: Reported on 08/18/2022   Everrett Coombe, DO Not Taking Active   tacrolimus (PROTOPIC) 0.1 % ointment [id number] Yes Apply 1 application topically daily. [provider] Taking Active Self  tadalafil (CIALIS) 20 MG tablet [id number] Yes Take 0.5-1 tablets (10-20 mg total) by mouth every other day as needed for erectile dysfunction. Everrett Coombe, DO Taking Active   tamsulosin (FLOMAX) 0.4 MG CAPS capsule  [id number] Yes Take 1 capsule (0.4 mg total) by mouth at bedtime. Everrett Coombe, DO Taking Active   Testosterone 30 MG/ACT SOLN [id number] Yes APPLY 1-2 PUMPS DAILY. ALTERNATE TAKING ONE PUMP UNDER ONE ARM ONE DAY, TWO PUMPS THE NEXT DAY (ONE PUMP UNDER EACH ARM) Everrett Coombe, DO Taking Active            Med Note Littie Deeds, Zaire Levesque A   Wed Aug 18, 2022  3:22 PM) Was only using twice weekly   torsemide (DEMADEX) 100 MG tablet [id number] Yes Take 1 tablet (100 mg total) by mouth daily. Everrett Coombe, DO Taking Active   vitamin C (ASCORBIC ACID) 500 MG tablet [id number] Yes Take 500 mg by mouth daily. [provider] Taking Active Self           Assessment/Plan:   Medication Access/Adherence - Reviewed plan formulary, and testosterone 25mg /2.5g or 50mg /5g packets are preferred (tier 3 versus tier 4 current 30mg Salvadore Farber  script falls under) - Consulting Dr. Ashley Royalty, as I would recommend testosterone lab levels be drawn: last drawn in January and quite elevated  Diabetes: - Currently uncontrolled - Continue current Lantus regimen and use of CGM - Meets financial criteria for Ozempic patient assistance program through [employer]. Will collaborate with provider, CPhT, and patient to pursue assistance.  - As Ozempic dose titrates, we may need to begin decreasing Lantus dosing to prevent hypoglycemia  Follow Up Plan: Will track progress of PAP and keep patient informed.  Will also schedule patient follow-up once medication is received and therapy started to evaluate tolerance/effectiveness.  Lenna Gilford, PharmD, DPLA

## 2022-08-19 ENCOUNTER — Other Ambulatory Visit (HOSPITAL_COMMUNITY): Payer: Self-pay

## 2022-08-19 ENCOUNTER — Telehealth: Payer: Self-pay

## 2022-08-19 DIAGNOSIS — M24139 Other articular cartilage disorders, unspecified wrist: Secondary | ICD-10-CM | POA: Insufficient documentation

## 2022-08-19 NOTE — Telephone Encounter (Signed)
PAP application for OZEMPIC/[employer]  has been mailed to pt home. I will fax PCP pages once I receive pt pages.  Lakeita Panther L. CPhT Rx Patient Advocate (O) [phone] (F) [phone]    

## 2022-08-19 NOTE — Telephone Encounter (Signed)
-----   Message from Lenna Gilford, [facility] sent at 08/18/2022  3:58 PM EDT ----- Patient would qualify for Ozempic PAP through [employer].  Dr. Ashley Royalty would like to initiate at 0.25mg  weekly x4 weeks, then 0.5mg  weekly x4 weeks, then 1mg  weekly.  Could you all please initiate the application process for the patient?  Thank you!

## 2022-08-26 ENCOUNTER — Telehealth: Payer: Self-pay

## 2022-08-26 NOTE — Progress Notes (Addendum)
   08/26/2022  Patient ID: Frederick Guzman, male   DOB: Feb 13, 1951, 72 y.o.   MRN: [id number]  S/O: Patient outreach to return call from Mr. Ginyard.  Patient wanting to check on status of possible change to a testosterone product that would be cheaper on insurance.  DM -Informed patient has completed his portion of PAP application and plans to return this week -Discussed storage and administration technique, dial dose and inject similar to his Lantus pens except only used once weekly -Noticed 6/7 message  to Dr. Ashley Royalty from patient with concern around current BG readings and efficacy of Lantus.  Patient has been taking 60 units of Lantus at night and is reporting readings in the 300-400's.  Dr. Ashley Royalty instructed him to being taking 40 units twice daily.  Medication Access/Adherence -Informed patient that I was able to identify a testosterone product that is a lower tier and should be cheaper on his insurance plan that his current 30mg /actuation pump.  However, I have not yet received communication from Dr. Ashley Royalty around changing medication.  A/P:  DM -Patient is likely over-basalized at this point with long-acting insulin dose being >0.5 units/kg/day.  Will discuss consideration of basal/bolus regimen with Dr. Ashley Royalty.  Could obtain better control by cutting basal dose in half 40 units daily and initiating meal-time insulin at 5 units TID, likely needing to titrate up to 10 units TID until we can get Ozempic on board and titrated.  Medication Access/Adherence -Following back up with Dr. Ashley Royalty to see if he is agreeable to change patients testosterone to either testosterone 25mg /2.5g or 50mg /5g packets for cost savings -Patient reports only using 2x/wk, so likely getting total weekly dose of 120mg  testosterone.  Based on this, would recommend 50mg  three days a week with a follow-up testosterone level at September PCP appointment  Follow-up:  Patient going out of town for 2 months  in July; I will follow-up with him regularly to monitor BG and assist with adjusting diabetes medications if needed.  Lenna Gilford, PharmD, DPLA

## 2022-08-27 ENCOUNTER — Encounter: Payer: Self-pay | Admitting: Family Medicine

## 2022-08-30 NOTE — Telephone Encounter (Signed)
I haven't received information regarding Ozempic being shipped yet, will keep an eye out.   For now increase lantus to 40 units twice per day. Follow low carbohydrate diet.  Look at labels.   CM

## 2022-08-31 ENCOUNTER — Telehealth: Payer: Self-pay

## 2022-08-31 NOTE — Progress Notes (Unsigned)
   08/31/2022  Patient ID: Frederick Guzman, male   DOB: 06/03/1950, 72 y.o.   MRN: [id number]  Mr. Balaban calling to inform me he has completed his portion of the Ozempic PAP application and returned to Dr. Anastasio Auerbach office, but they are uncertain of what to do with the application.  I contacted office CMA to see if they could scan into patient's chart under media tab.  Contacted medication assistance team to inform them, so they can send provider portion.  Lenna Gilford, PharmD, DPLA

## 2022-09-03 ENCOUNTER — Telehealth: Payer: Self-pay

## 2022-09-03 NOTE — Telephone Encounter (Signed)
I receives pt pages and I have faxed provider Dr. Ashley Royalty office. Please have provider to review, fill out, sign and fax to [phone] ATTENTION Efosa Treichler L.   PLEASE BE ADVISED   Melanee Spry CPhT Rx Patient Advocate [phone]

## 2022-09-03 NOTE — Progress Notes (Unsigned)
   09/03/2022  Patient ID: Frederick Guzman, male   DOB: Oct 06, 1950, 72 y.o.   MRN: [id number]  S/O:  Diabetes Management -Current medications:  Lantus recently increased to 40 units BID due to symptomatic hyperglycemia -Currently working on PAP for Ozempic; awaiting provider portion to submit application to [employer] -Patient states since increasing insulin, CGM stating he is "in the green" 74-80% of the time -He does endorse 2 hypoglycemic events during the night where CGM woke him to alert -Patient is likely over-basalized and needing meal-time insulin or a sulfonylurea at this point.  Discussed options with patient as well as dietary modifications.  Medication Access/Adherence -Verified total weekly dose of testosterone patient has been applying:  2 pumps of 30mg /acuation two days a week to total 120mg  -Discussed testosterone packet option with patient, as this appears to be a cheaper alternative on his formulary- he is in agreement with changing formulations  A/P  Diabetes Management -Recommend initiation of glipizide 5mg  daily with largest meal and decreasing basal insulin to 35 units BID -Goal is to titrate Ozempic up to max dose of 2mg  weekly while stopping glipizide and decreasing insulin as BG becomes better controlled -Prescription pending for glipizide for Dr. Ashley Royalty to sign if in agreement.  Patient made aware to take with his typical largest meal of the day and to decrease insulin when he starts this therapy  Medication Access/Adherence -Prescription pending for testosterone 25mg  packets for patient to apply every other day for a total weekly dose of approximately 112.5mg   Follow-up:  Continuing to monitor progress of PAP application.  Once orders from this visit are signed, I will contact pharmacy to verify testosterone cost then notify the patient and schedule 2 week f/u to check on home BG  Lenna Gilford, PharmD, DPLA   Lenna Gilford, PharmD, DPLA

## 2022-09-07 ENCOUNTER — Other Ambulatory Visit: Payer: Medicare Other

## 2022-09-07 NOTE — Progress Notes (Unsigned)
   09/07/2022  Patient ID: Frederick Guzman, male   DOB: 1950/12/28, 72 y.o.   MRN: [id number]  Taking 2 pumps twice weekly=120mg  Change to 25mg  packets every other day-when signed called pharmacy and check price Using 40 units BID basal and seeing 74-80 "in the green" Has gone low a couple of times Discussed meal-time insulin or sulfonylurea or increased lifestyle changes at least until we can   Goes out of town 7/10 for 2 months

## 2022-09-08 ENCOUNTER — Telehealth: Payer: Self-pay

## 2022-09-08 MED ORDER — GLIPIZIDE 5 MG PO TABS: 5.0000 mg | ORAL_TABLET | Freq: Every day | ORAL | 0 refills | Status: DC

## 2022-09-08 MED ORDER — TESTOSTERONE 25 MG/2.5GM (1%) TD GEL: 1.0000 | TRANSDERMAL | 1 refills | Status: DC

## 2022-09-08 NOTE — Telephone Encounter (Signed)
[facility] pharmacy called and state the Testosterone 1 % gel packets is not a covered product with his insurance. They recommend the Testosterone 1.62 % pump.

## 2022-09-08 NOTE — Telephone Encounter (Signed)
Spoke with Darleene Cleaver- he reqeusted that we contact Sabino Niemann [phone] To inquire about the Ozempic status ( pt assistance??)  States she had also been trying to contact  Dr. Ashley Royalty about possible medication change( states has maxed out Insulin at 40u BID )  but has not been able to get through .  Currently states average BS numbers are in the "green " about 60% of the time while on max dose of Lantus.  Attempted call to Sabino Niemann. Left a voice mail message requesting a return call.

## 2022-09-08 NOTE — Telephone Encounter (Signed)
Spoke with patient. Informed that patient assistance form sent yesterday 09/07/22 and could take quit awhile to result. Patient also informed of the medication changes were changing testosterone from pump to packets to see if insurance coverage would be cheaper and new medication glipizide sent for DM .

## 2022-09-08 NOTE — Telephone Encounter (Signed)
Hi Cheryl,   Pharmacy is saying that his insurance will only cover the pump.  Any other suggestions?  CM CM

## 2022-09-09 DIAGNOSIS — R3912 Poor urinary stream: Secondary | ICD-10-CM | POA: Diagnosis not present

## 2022-09-09 DIAGNOSIS — R350 Frequency of micturition: Secondary | ICD-10-CM | POA: Diagnosis not present

## 2022-09-09 DIAGNOSIS — N401 Enlarged prostate with lower urinary tract symptoms: Secondary | ICD-10-CM | POA: Diagnosis not present

## 2022-09-09 DIAGNOSIS — N5201 Erectile dysfunction due to arterial insufficiency: Secondary | ICD-10-CM | POA: Diagnosis not present

## 2022-09-13 ENCOUNTER — Other Ambulatory Visit: Payer: Self-pay | Admitting: Family Medicine

## 2022-09-13 DIAGNOSIS — J449 Chronic obstructive pulmonary disease, unspecified: Secondary | ICD-10-CM

## 2022-09-16 DIAGNOSIS — Z85820 Personal history of malignant melanoma of skin: Secondary | ICD-10-CM | POA: Diagnosis not present

## 2022-09-16 DIAGNOSIS — D225 Melanocytic nevi of trunk: Secondary | ICD-10-CM | POA: Diagnosis not present

## 2022-09-16 DIAGNOSIS — D0461 Carcinoma in situ of skin of right upper limb, including shoulder: Secondary | ICD-10-CM | POA: Diagnosis not present

## 2022-09-16 DIAGNOSIS — C44629 Squamous cell carcinoma of skin of left upper limb, including shoulder: Secondary | ICD-10-CM | POA: Diagnosis not present

## 2022-09-16 DIAGNOSIS — D2239 Melanocytic nevi of other parts of face: Secondary | ICD-10-CM | POA: Diagnosis not present

## 2022-09-16 DIAGNOSIS — L570 Actinic keratosis: Secondary | ICD-10-CM | POA: Diagnosis not present

## 2022-09-16 DIAGNOSIS — Z85828 Personal history of other malignant neoplasm of skin: Secondary | ICD-10-CM | POA: Diagnosis not present

## 2022-09-16 DIAGNOSIS — L821 Other seborrheic keratosis: Secondary | ICD-10-CM | POA: Diagnosis not present

## 2022-09-27 DIAGNOSIS — M13831 Other specified arthritis, right wrist: Secondary | ICD-10-CM | POA: Diagnosis not present

## 2022-09-27 DIAGNOSIS — M79641 Pain in right hand: Secondary | ICD-10-CM | POA: Diagnosis not present

## 2022-09-28 DIAGNOSIS — D0461 Carcinoma in situ of skin of right upper limb, including shoulder: Secondary | ICD-10-CM | POA: Diagnosis not present

## 2022-09-28 DIAGNOSIS — Z85820 Personal history of malignant melanoma of skin: Secondary | ICD-10-CM | POA: Diagnosis not present

## 2022-09-28 DIAGNOSIS — Z85828 Personal history of other malignant neoplasm of skin: Secondary | ICD-10-CM | POA: Diagnosis not present

## 2022-10-06 ENCOUNTER — Other Ambulatory Visit: Payer: Self-pay | Admitting: Family Medicine

## 2022-10-08 NOTE — Telephone Encounter (Signed)
RECEIVED BOTH PROVIDER AND PT PAGES AND HAS Submitted application for OZEMPIC to [employer] for patient assistance PROCESSING.   Phone: [phone]  PLEASE BE ADVISED

## 2022-10-12 ENCOUNTER — Telehealth: Payer: Self-pay

## 2022-10-12 NOTE — Progress Notes (Signed)
   10/12/2022  Patient ID: Frederick Guzman, male   DOB: 1950-10-19, 72 y.o.   MRN: [id number]  Returning patient call from this morning.  Frederick Guzman is calling to check on the status of his Ozempic PAP application.  All portions of the application were faxed and in process as of 7/19.  I am coordinating with medication assistance team to see if there are any updates and will notify the patient.  Lenna Gilford, PharmD, DPLA

## 2022-10-13 NOTE — Progress Notes (Signed)
   10/13/2022  Patient ID: Frederick Guzman, male   DOB: Feb 12, 1951, 72 y.o.   MRN: [id number]  Medication assistance team contacted [employer] PAP, and [name] application was lacking a full image of his insurance card.  This was sent to them, and the application determination should be known Thursday.  I contacted the patient to inform him and let him know I will contact him at the end of the week once we know if the application is approved.  Lenna Gilford, PharmD, DPLA

## 2022-10-14 ENCOUNTER — Other Ambulatory Visit (HOSPITAL_COMMUNITY): Payer: Self-pay

## 2022-10-14 NOTE — Telephone Encounter (Signed)
Received notification from [employer] regarding approval for OZEMPIC. Patient assistance approved from 10/14/2022 to 03/22/2023.  Phone: [phone]  PLEASE BE ADVISED PT CAN CALL NUMBER ABOVE TO CHECK STATUS.

## 2022-10-15 ENCOUNTER — Telehealth: Payer: Self-pay

## 2022-10-15 NOTE — Progress Notes (Unsigned)
   10/15/2022  Patient ID: Frederick Guzman, male   DOB: 21-Apr-1950, 72 y.o.   MRN: [id number]  Patient outreach to inform Mr. Mckaskle that his Ozempic PAP through [employer] has been approved.   -Ozempic 0.25/0.5mg  should arrive at PCP office in 10-14 days -Patient is out of town until the 1st week of September, so he will not be able to pick the medication up until then -Will make PCK aware and Lynnda Shields, so she can schedule follow-up with Mr. Pretty the 1st week of Semptemer to address initiation of therapy.  Lenna Gilford, PharmD, DPLA

## 2022-11-01 ENCOUNTER — Telehealth: Payer: Self-pay | Admitting: Family Medicine

## 2022-11-01 NOTE — Telephone Encounter (Signed)
Patient's SO called in for a refill on Lantus Solostar 100 unit/ml. Patient will be leaving [place] to go on another road trip to [place]. Would like to picked up before Wed. Will not be near a pharmacy for the next 3 days after they leave [place].  [facility] [address] [place], [place] [phone]

## 2022-11-02 ENCOUNTER — Telehealth: Payer: Self-pay

## 2022-11-02 NOTE — Telephone Encounter (Signed)
Forwarding to [place] as an Financial planner.  Frederick Guzman  [employer] PAP shipment for Oxempic 0.25/05 mg & 1 mg dose / 2 boxes each received this morning. Please contact the patient to come and pick up their order today. Placed in the PAP fridge with patient identifier. Thanks in advance.   NDC: 7829-5621-30 (0.25/0.5 MG) LOT: [id number] EXP: 2024-03-21  NDC: 9629-5284-13 (1 MG) LOT: [id number] EXP: 2027-01-312WS

## 2022-11-04 MED ORDER — LANTUS SOLOSTAR 100 UNIT/ML ~~LOC~~ SOPN: 60.0000 [IU] | PEN_INJECTOR | Freq: Every day | SUBCUTANEOUS | 0 refills | Status: DC

## 2022-11-04 NOTE — Telephone Encounter (Signed)
Faxed OV noted to [facility] per request. Fax: [phone]

## 2022-11-19 ENCOUNTER — Other Ambulatory Visit: Payer: Self-pay | Admitting: Family Medicine

## 2022-12-01 ENCOUNTER — Encounter: Payer: Self-pay | Admitting: Family Medicine

## 2022-12-01 ENCOUNTER — Ambulatory Visit (INDEPENDENT_AMBULATORY_CARE_PROVIDER_SITE_OTHER): Payer: Medicare Other | Admitting: Family Medicine

## 2022-12-01 VITALS — BP 108/66 | HR 77 | Ht 72.0 in | Wt 206.2 lb

## 2022-12-01 DIAGNOSIS — J441 Chronic obstructive pulmonary disease with (acute) exacerbation: Secondary | ICD-10-CM

## 2022-12-01 DIAGNOSIS — N400 Enlarged prostate without lower urinary tract symptoms: Secondary | ICD-10-CM | POA: Diagnosis not present

## 2022-12-01 DIAGNOSIS — F3341 Major depressive disorder, recurrent, in partial remission: Secondary | ICD-10-CM | POA: Diagnosis not present

## 2022-12-01 DIAGNOSIS — I10 Essential (primary) hypertension: Secondary | ICD-10-CM | POA: Diagnosis not present

## 2022-12-01 DIAGNOSIS — Z794 Long term (current) use of insulin: Secondary | ICD-10-CM | POA: Diagnosis not present

## 2022-12-01 DIAGNOSIS — Z23 Encounter for immunization: Secondary | ICD-10-CM

## 2022-12-01 DIAGNOSIS — E1165 Type 2 diabetes mellitus with hyperglycemia: Secondary | ICD-10-CM | POA: Diagnosis not present

## 2022-12-01 LAB — POCT GLYCOSYLATED HEMOGLOBIN (HGB A1C): HbA1c, POC (controlled diabetic range): 5.6 % (ref 0.0–7.0)

## 2022-12-01 NOTE — Assessment & Plan Note (Signed)
Blood pressure remains well-controlled.  He will continue current medications for management of hypertension.

## 2022-12-01 NOTE — Assessment & Plan Note (Signed)
Diabetes is much better controlled.  He did receive his Ozempic and will plan to start this.  Will have him discontinue glipizide.  We discussed that we will likely need to down titrate his insulin as we go up on his Ozempic.  Instructions given.  He will continue to monitor glucose with freestyle libre.

## 2022-12-01 NOTE — Assessment & Plan Note (Signed)
He has COPD along with asthma overlap.  He will continue on Breztri.  Continue albuterol as needed.

## 2022-12-01 NOTE — Progress Notes (Signed)
Frederick Guzman - 72 y.o. male MRN [id number]  Date of birth: [birth year]  Subjective Chief Complaint  Patient presents with   Hypertension   Diabetes    HPI Frederick Guzman is a 72 y.o. male here today for follow up visit.   He reports that he is doing well.   He has qualified for [employer] patient assistance and recently got shipment.  Has not started this yet.  He has remained on glipizide and lantus for management of diabetes.  Blood sugars at home have been pretty well-controlled.  He has had some hypoglycemia in the evening..   Remains on atorvasstatin for associated HLD.  Tolerating this well.   Remains on breztri daily with albuterol as needed for management of COPD symptoms.  Stable at this time. He has remained quit from smoking.   BP is well-controlled..  Remains on lisinopril.  Also lasix as needed for edema.  He is seeing cardiology as well.   Mood stable with bupropion.  LUTS remain stable with flomax.  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   Arthritis    OA   Arthritis of wrist 05/28/2011   Biliary obstruction 09/04/2021   BPH (benign prostatic hyperplasia) 06/17/2011   BRONCHITIS, CHRONIC 03/10/2007   Qualifier: Diagnosis of   By: Craige Cotta MD, Vineet       Cancer [facility]) 08/2014   melanoma on R shoulder & back - [facility]    Combined forms of age-related cataract of right eye 09/21/2018   COPD (chronic obstructive pulmonary disease) (HCC)    COPD with exacerbation (HCC) 04/13/2021   Diverticulitis    Dyspnea    with exertion   Elevated liver enzymes 09/03/2021    Generalized abdominal pain    GERD (gastroesophageal reflux disease)    HTN (hypertension)    Hyperlipemia    Hyperlipidemia 08/07/2020   Hypogonadism male 06/17/2011   Long term use of drug 04/19/2017   Lymphocytic colitis 04/2018   MDD (major depressive disorder) (HCC) 04/30/2011   OBSTRUCTIVE SLEEP APNEA 03/10/2007   Qualifier: Diagnosis of   By: Craige Cotta MD, Vineet       Pain in joint of left shoulder 04/19/2017   Pain in right hand 08/04/2017   Pneumonia 06/2015, 10/2016   Positive depression screening 09/21/2016   Pulmonary nodules 10/23/2017   S/P laparoscopic cholecystectomy 09/03/2021   S/P shoulder replacement, left 01/30/2016   Status post shoulder surgery 08/09/2016   TOBACCO ABUSE 03/10/2007   Qualifier: Diagnosis of   By: Craige Cotta MD, Vineet       Transaminitis 09/03/2021   Uncontrolled type 2 diabetes mellitus with hyperglycemia, without long-term current use of insulin (HCC) 09/21/2016   Likely will need dual or triple therapy but will start with metformin alone in case of intolerance. Close follow-up with home blood sugar readings. ?steroid rol    Past Surgical History:  Procedure Laterality Date   carpel tunnel Bilateral    CHOLECYSTECTOMY N/A 08/31/2021   Procedure: LAPAROSCOPIC CHOLECYSTECTOMY AND UMBILICAL HERNIA REPAIR;  Surgeon: Manus Rudd, MD;  Location: [facility];  Service:  General;  Laterality: N/A;   COLONOSCOPY W/ POLYPECTOMY     COLONOSCOPY W/ POLYPECTOMY     EXCISIONAL TOTAL SHOULDER ARTHROPLASTY WITH ANTIBIOTIC SPACER Left 08/09/2016   Procedure: LEFT SHOULDER HARDWARE REMOVAL, IRRIGATION AND DEBRIDEMENT, PLACEMENT OF ANTIBIOTIC SPACER;  Surgeon: Beverely Low, MD;  Location: [facility] OR;  Service: Orthopedics;  Laterality: Left;   IRRIGATION AND DEBRIDEMENT SHOULDER Left 03/31/2016   Procedure: IRRIGATION AND DEBRIDEMENT SHOULDER, deep cultures and polyethylene exchange;  Surgeon: Beverely Low, MD;  Location: [facility] OR;  Service: Orthopedics;  Laterality: Left;   requests   REVERSE SHOULDER ARTHROPLASTY Left 01/30/2016   Procedure: LEFT REVERSE SHOULDER ARTHROPLASTY;  Surgeon: Beverely Low, MD;  Location: [facility] OR;  Service: Orthopedics;  Laterality: Left;   ROTATOR CUFF REPAIR Left    SHOULDER ARTHROSCOPY WITH DISTAL CLAVICLE RESECTION Right 01/30/2018   Procedure: RIGHT SHOULDER ARTHROSCOPY WITH OPEN DISTAL CLAVICLE RESECTION, SUBACROMIAL DECOMPRESSION;  Surgeon: Beverely Low, MD;  Location: [facility] OR;  Service: Orthopedics;  Laterality: Right;   STERIOD INJECTION Right 01/30/2018   Procedure: STEROID INJECTION;  Surgeon: Beverely Low, MD;  Location: [facility] OR;  Service: Orthopedics;  Laterality: Right;   TOTAL SHOULDER REVISION Left 01/17/2017   Procedure: LEFT SHOULDER REVISION REVERSE TOTAL SHOULDER ARTHROPLASTY;  Surgeon: Beverely Low, MD;  Location: [facility] OR;  Service: Orthopedics;  Laterality: Left;   UMBILICAL HERNIA REPAIR N/A 08/31/2021   Procedure: HERNIA REPAIR UMBILICAL ADULT possible;  Surgeon: Manus Rudd, MD;  Location: [facility];  Service: General;  Laterality: N/A;   WRIST SURGERY Right 11/2014   Dr. Amanda Pea    Social History   Socioeconomic History   Marital status: Domestic Partner    Spouse name: Thurston Hole   Number of children: 2   Years of education: 12   Highest education level: 12th grade  Occupational History   Occupation: lineman    Comment: retired  Tobacco Use   Smoking status: Former    Current packs/day: 0.00    Average packs/day: 0.8 packs/day for 43.0 years (32.3 ttl pk-yrs)    Types: Cigarettes    Start date: 03/1979    Quit date: 03/2022    Years since quitting: 0.6   Smokeless tobacco: Never   Tobacco comments:    pt no longer taking chantix  Vaping Use   Vaping status: Never Used  Substance and Sexual Activity   Alcohol use: Yes    Alcohol/week: 9.0 standard drinks of alcohol    Types: 6 Cans of beer, 3 Shots of liquor per week    Comment: beer or mixed drink    Drug use: No   Sexual activity: Yes   Other Topics Concern   Not on file  Social History Narrative   Lives with his girlfriend, Thurston Hole. Retired. DOes a lot of yard work, travelling in the RV and handy work. Members of the camping club.   Social Determinants of Health   Financial Resource Strain: Medium Risk (11/27/2022)   Overall Financial Resource Strain (CARDIA)    Difficulty of Paying Living Expenses: Somewhat hard  Food Insecurity: No Food Insecurity (11/27/2022)   Hunger Vital Sign    Worried About Running Out of Food in the Last Year: Never true    Ran Out of Food in the Last Year: Never true  Transportation Needs: No Transportation Needs (11/27/2022)   PRAPARE - Administrator, Civil Service (Medical): No    Lack of Transportation (Non-Medical): No  Physical Activity: Insufficiently Active (11/27/2022)   Exercise Vital Sign  Days of Exercise per Week: 4 days    Minutes of Exercise per Session: 30 min  Stress: No Stress Concern Present (11/27/2022)   [employer] of Occupational Health - Occupational Stress Questionnaire    Feeling of Stress : Not at all  Social Connections: Moderately Integrated (11/27/2022)   Social Connection and Isolation Panel [NHANES]    Frequency of Communication with Friends and Family: More than three times a week    Frequency of Social Gatherings with Friends and Family: More than three times a week    Attends Religious Services: More than 4 times per year    Active Member of Clubs or Organizations: Yes    Attends Engineer, structural: More than 4 times per year    Marital Status: Divorced    Family History  Problem Relation Age of Onset   Diabetes Mother    Colon cancer Father    Prostate cancer Father    Diabetes Father    Esophageal cancer Neg Hx    Rectal cancer Neg Hx    Stomach cancer Neg Hx     Health Maintenance  Topic Date Due   Diabetic kidney evaluation - Urine ACR  12/09/2022   OPHTHALMOLOGY EXAM  12/31/2022 (Originally 11/26/2022)   COVID-19  Vaccine (7 - 2023-24 season) 02/16/2023 (Originally 11/21/2022)   FOOT EXAM  04/14/2023   Lung Cancer Screening  05/11/2023   HEMOGLOBIN A1C  05/31/2023   Medicare Annual Wellness (AWV)  08/11/2023   Diabetic kidney evaluation - eGFR measurement  08/12/2023   Colonoscopy  05/11/2025   DTaP/Tdap/Td (3 - Td or Tdap) 02/18/2029   Pneumonia Vaccine 56+ Years old  Completed   INFLUENZA VACCINE  Completed   Hepatitis C Screening  Completed   Zoster Vaccines- Shingrix  Completed   HPV VACCINES  Aged Out     ----------------------------------------------------------------------------------------------------------------------------------------------------------------------------------------------------------------- Physical Exam BP 108/66 (BP Location: Left Arm, Patient Position: Sitting, Cuff Size: Large)   Pulse 77   Ht 6' (1.829 m)   Wt 206 lb 3.2 oz (93.5 kg)   SpO2 94%   BMI 27.97 kg/m   Physical Exam Constitutional:      Appearance: Normal appearance.  Eyes:     General: No scleral icterus. Cardiovascular:     Rate and Rhythm: Normal rate and regular rhythm.  Pulmonary:     Effort: Pulmonary effort is normal.     Breath sounds: Normal breath sounds.  Neurological:     Mental Status: He is alert.  Psychiatric:        Mood and Affect: Mood normal.        Behavior: Behavior normal.     ------------------------------------------------------------------------------------------------------------------------------------------------------------------------------------------------------------------- Assessment and Plan  MDD (major depressive disorder) (HCC) Continues on bupropion.  Stable at this time.  COPD (chronic obstructive pulmonary disease) (HCC) He has COPD along with asthma overlap.  He will continue on Breztri.  Continue albuterol as needed.  BPH (benign prostatic hyperplasia) Flomax remains effective for lower urinary tract symptoms.  Will plan to continue  this.  Uncontrolled type 2 diabetes mellitus with hyperglycemia, without long-term current use of insulin (HCC) Diabetes is much better controlled.  He did receive his Ozempic and will plan to start this.  Will have him discontinue glipizide.  We discussed that we will likely need to down titrate his insulin as we go up on his Ozempic.  Instructions given.  He will continue to monitor glucose with freestyle libre.  HTN (hypertension) Blood pressure remains well-controlled.  He will  continue current medications for management of hypertension.   No orders of the defined types were placed in this encounter.   Return in about 3 months (around 03/02/2023) for Type 2 Diabetes.    This visit occurred during the SARS-CoV-2 public health emergency.  Safety protocols were in place, including screening questions prior to the visit, additional usage of staff PPE, and extensive cleaning of exam room while observing appropriate contact time as indicated for disinfecting solutions.

## 2022-12-01 NOTE — Assessment & Plan Note (Signed)
Continues on bupropion.  Stable at this time.

## 2022-12-01 NOTE — Assessment & Plan Note (Signed)
Flomax remains effective for lower urinary tract symptoms.  Will plan to continue this.

## 2022-12-01 NOTE — Patient Instructions (Addendum)
Stop Glipizide.  Start Ozempic- Take 0.25mg  once per week x4 weeks then increase to 0.5mg  once per week x4 weeks then increase to 1mg  once per week.  Once you reach the 0.5mg  strength reduce your insulin to 5 units in the morning and 5 units in the evening.   See me again in 3 months.

## 2022-12-02 DIAGNOSIS — Z961 Presence of intraocular lens: Secondary | ICD-10-CM | POA: Diagnosis not present

## 2022-12-02 DIAGNOSIS — H35373 Puckering of macula, bilateral: Secondary | ICD-10-CM | POA: Diagnosis not present

## 2022-12-02 DIAGNOSIS — H40013 Open angle with borderline findings, low risk, bilateral: Secondary | ICD-10-CM | POA: Diagnosis not present

## 2022-12-02 DIAGNOSIS — H31092 Other chorioretinal scars, left eye: Secondary | ICD-10-CM | POA: Diagnosis not present

## 2022-12-02 DIAGNOSIS — E119 Type 2 diabetes mellitus without complications: Secondary | ICD-10-CM | POA: Diagnosis not present

## 2022-12-02 LAB — HM DIABETES EYE EXAM

## 2022-12-03 DIAGNOSIS — Z87891 Personal history of nicotine dependence: Secondary | ICD-10-CM | POA: Diagnosis not present

## 2022-12-05 ENCOUNTER — Encounter: Payer: Self-pay | Admitting: Family Medicine

## 2022-12-08 ENCOUNTER — Other Ambulatory Visit: Payer: Self-pay | Admitting: Family Medicine

## 2022-12-08 DIAGNOSIS — R918 Other nonspecific abnormal finding of lung field: Secondary | ICD-10-CM | POA: Diagnosis not present

## 2022-12-08 DIAGNOSIS — Z87891 Personal history of nicotine dependence: Secondary | ICD-10-CM | POA: Diagnosis not present

## 2022-12-08 DIAGNOSIS — G4733 Obstructive sleep apnea (adult) (pediatric): Secondary | ICD-10-CM | POA: Diagnosis not present

## 2022-12-08 DIAGNOSIS — J449 Chronic obstructive pulmonary disease, unspecified: Secondary | ICD-10-CM | POA: Diagnosis not present

## 2022-12-09 ENCOUNTER — Encounter: Payer: Self-pay | Admitting: Family Medicine

## 2022-12-10 ENCOUNTER — Other Ambulatory Visit: Payer: Self-pay | Admitting: Family Medicine

## 2022-12-16 MED ORDER — FREESTYLE LIBRE 3 READER DEVI: 0 refills | Status: AC

## 2022-12-16 NOTE — Telephone Encounter (Signed)
Rx  for reader printed out and placed in [name]'s box.

## 2022-12-17 ENCOUNTER — Telehealth: Payer: Self-pay

## 2022-12-17 NOTE — Telephone Encounter (Signed)
Faxed to [facility] supply at fax # [phone] Phone # [phone]

## 2022-12-17 NOTE — Progress Notes (Signed)
12/17/2022  Patient ID: Frederick Guzman, male   DOB: [birth year], 72 y.o.   MRN: [id number]  Patient outreach to schedule telephone visit to follow-up on DM control and check on tolerance/efficacy of Ozempic.  Visit scheduled for Tuesday 10/1 at 930am.  Lenna Gilford, PharmD, DPLA

## 2022-12-21 ENCOUNTER — Other Ambulatory Visit: Payer: Medicare Other

## 2022-12-21 NOTE — Progress Notes (Unsigned)
12/21/2022  Patient ID: Frederick Guzman, male   DOB: Aug 18, 1950, 72 y.o.   MRN: [id number]  S/O Telephone follow-up to check on control of DM  Diabetes -Current medications:  Lantus 30 units BID, Ozempic 0.25mg  weekly -Has not been able to Pluckemin 3 sensor b/c insurance (Medicare B) requiring reader, and this was going to cost >$200.  He continues to use [employer]ist[employer] for now. Josephine Igo Reader indicates 80-90% in range, BG 100-120 -A1c 5.6 on 9/11  A/P  Diabetes -Will start Ozemipc 0.5mg  after 4 weeks of 0.25mg  weekly and decrease insulin to 25 units BID  Follow-up: 11/4  Lenna Gilford, PharmD, DPLA

## 2022-12-23 ENCOUNTER — Encounter: Payer: Self-pay | Admitting: Podiatry

## 2022-12-23 ENCOUNTER — Ambulatory Visit: Payer: Medicare Other

## 2022-12-23 ENCOUNTER — Other Ambulatory Visit: Payer: Self-pay

## 2022-12-23 ENCOUNTER — Ambulatory Visit (INDEPENDENT_AMBULATORY_CARE_PROVIDER_SITE_OTHER): Payer: Medicare Other | Admitting: Podiatry

## 2022-12-23 DIAGNOSIS — B351 Tinea unguium: Secondary | ICD-10-CM | POA: Diagnosis not present

## 2022-12-23 DIAGNOSIS — M722 Plantar fascial fibromatosis: Secondary | ICD-10-CM | POA: Diagnosis not present

## 2022-12-23 DIAGNOSIS — M79672 Pain in left foot: Secondary | ICD-10-CM

## 2022-12-23 DIAGNOSIS — M79675 Pain in left toe(s): Secondary | ICD-10-CM

## 2022-12-23 DIAGNOSIS — E119 Type 2 diabetes mellitus without complications: Secondary | ICD-10-CM

## 2022-12-23 DIAGNOSIS — M79674 Pain in right toe(s): Secondary | ICD-10-CM

## 2022-12-23 MED ORDER — MELOXICAM 15 MG PO TABS: 15.0000 mg | ORAL_TABLET | Freq: Every day | ORAL | 0 refills | Status: AC

## 2022-12-23 NOTE — Addendum Note (Signed)
Addended by: Daryel November on: 12/23/2022 03:27 PM   Modules accepted: Orders

## 2022-12-23 NOTE — Progress Notes (Signed)
Subjective:  Patient ID: Frederick Guzman, male    DOB: [birth year],   MRN: [id number]  Chief Complaint  Patient presents with   Foot Pain    PT PRESENTS FOR PAIN IN THE LEFT FOOT BETWEEN THE ARCH AND THE HEEL.     72 y.o. male presents for multiple concerns including concern of thickened elongated and painful nails that are difficult to trim. Requesting to have them trimmed today. Denies burning and tingling in their feet. Patient is diabetic and last A1c was  Lab Results  Component Value Date   HGBA1C 5.6 12/01/2022   . Relates he would like to see if fungus is present in the nails  Also relates concern for left heel pain that has been ongoing for about 2 months. Denies any injury. Relates first steps in the morning are very painful or after being on feet for a while. 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 Relates recently when on a cross country trip and was on his feet a lot. Denies any current treatments.   PCP:  Everrett Coombe, DO    . Denies any other pedal complaints. Denies n/v/f/c.   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   Arthritis    OA   Arthritis of wrist 05/28/2011   Biliary obstruction 09/04/2021   BPH (benign prostatic hyperplasia) 06/17/2011   BRONCHITIS, CHRONIC 03/10/2007   Qualifier: Diagnosis of   By: Frederick Cotta MD, Vineet       Cancer [facility]) 08/2014   melanoma on R shoulder & back - [facility]    Combined forms of age-related cataract of right eye 09/21/2018   COPD (chronic obstructive pulmonary disease) (HCC)    COPD with exacerbation (HCC) 04/13/2021   Diverticulitis    Dyspnea    with exertion   Elevated liver enzymes 09/03/2021   Generalized abdominal pain    GERD (gastroesophageal reflux disease)    HTN (hypertension)    Hyperlipemia    Hyperlipidemia 08/07/2020   Hypogonadism male 06/17/2011   Long term use of drug 04/19/2017   Lymphocytic colitis  04/2018   MDD (major depressive disorder) (HCC) 04/30/2011   OBSTRUCTIVE SLEEP APNEA 03/10/2007   Qualifier: Diagnosis of   By: Frederick Cotta MD, Vineet       Pain in joint of left shoulder 04/19/2017   Pain in right hand 08/04/2017   Pneumonia 06/2015, 10/2016   Positive depression screening 09/21/2016   Pulmonary nodules 10/23/2017   S/P laparoscopic cholecystectomy 09/03/2021   S/P shoulder replacement, left 01/30/2016   Status post shoulder surgery 08/09/2016   TOBACCO ABUSE 03/10/2007   Qualifier: Diagnosis of   By: Frederick Cotta MD, Vineet       Transaminitis 09/03/2021   Uncontrolled type 2 diabetes mellitus with hyperglycemia, without long-term current use of insulin (HCC) 09/21/2016   Likely will need dual or triple therapy but will start with metformin alone in case of intolerance. Close follow-up with home blood sugar readings. ?steroid rol    Objective:  Physical Exam: Vascular: DP/PT pulses 2/4 bilateral. CFT <3 seconds. Normal hair growth on digits. No edema.  Skin. No lacerations or abrasions bilateral feet. Nails 1-5 bilateral thickened and elongated with subungual debris.  Musculoskeletal: MMT 5/5 bilateral lower extremities in DF, PF, Inversion and Eversion. Deceased ROM in DF of ankle joint. Tender to the medial calcaneal tubercle left . No pain with achilles, PT or arch.  No pain with calcaneal squeeze.  Neurological: Sensation intact to light touch.   Assessment:   1. Plantar fasciitis, left   2. Type 2 diabetes mellitus without complication, without long-term current use of insulin (HCC)   3. Pain due to onychomycosis of toenails of both feet   4. Onychomycosis      Plan:  Patient was evaluated and treated and all questions answered. Discussed plantar fasciitis with patient.  X-rays reviewed and discussed with patient. No acute fractures or dislocations noted. Mild spurring noted at inferior calcaneus.  Discussed treatment options including, ice, NSAIDS, supportive shoes,  bracing, and stretching. Stretching exercises provided to be done on a daily basis.   Prescription for meloxicam provided and sent to pharmacy. PF brace dispensed.  -Discussed and educated patient on diabetic foot care, especially with  regards to the vascular, neurological and musculoskeletal systems.  -Stressed the importance of good glycemic control and the detriment of not  controlling glucose levels in relation to the foot. -Discussed supportive shoes at all times and checking feet regularly.  -Mechanically debrided all nails 1-5 bilateral using sterile nail nipper and filed with dremel without incident  -Examined patient -Discussed treatment options for painful dystrophic nails  -Clinical picture and Fungal culture was obtained by removing a portion of the hard nail itself from each of the involved toenails using a sterile nail nipper and sent to [facility] lab. Patient tolerated the biopsy procedure well without discomfort or need for anesthesia.  -Discussed fungal nail treatment options including oral, topical, and laser treatments.  Follow-up 6 weeks or sooner if any problems arise. In the meantime, encouraged to call the office with any questions, concerns, change in symptoms.    Louann Sjogren, DPM

## 2022-12-23 NOTE — Patient Instructions (Signed)

## 2023-01-24 ENCOUNTER — Other Ambulatory Visit: Payer: Medicare Other

## 2023-01-24 NOTE — Progress Notes (Signed)
   01/24/2023  Patient ID: Frederick Guzman, male   DOB: 09-Aug-1950, 72 y.o.   MRN: [id number]  S/O Telephone visit to follow-up on management of T2DM  Diabetes Management Plan -Current medications:  Ozempic 0.5mg  weekly, Lantus 25 units BID -Patient recently increased to 0.5mg  Ozempic dose and endorse tolerating well with no adverse side effects.  He does endorse decreased appetite but no weight loss at this time. -He has what is left of 0.25/0.5mg  Ozempic pen that he is currently using and 2 pens of 1mg  Ozempic on hand -Libre 2 for CGM and endorses BG averaging 100-150 -He does not endorse any occurrences of hypoglycemia -Last A1c 5.6% on 9/11 -Currently enrolled in [employer]o PAP for Ozempic  A/P  Diabetes Management Plan -Currently controlled -Patient will increase Ozempic to 1mg  weekly after completing last pen he is currently using for 0.5mg  dose.  Will decrease Lantus to 20 units daily at this time -Sees Dr. Ashley Royalty 12/12 and will be due for A1c  Follow-up:  3 months  Lenna Gilford, PharmD, DPLA

## 2023-02-04 ENCOUNTER — Ambulatory Visit (INDEPENDENT_AMBULATORY_CARE_PROVIDER_SITE_OTHER): Payer: Medicare Other | Admitting: Podiatry

## 2023-02-04 ENCOUNTER — Encounter: Payer: Self-pay | Admitting: Podiatry

## 2023-02-04 ENCOUNTER — Other Ambulatory Visit: Payer: Self-pay | Admitting: Family Medicine

## 2023-02-04 DIAGNOSIS — B351 Tinea unguium: Secondary | ICD-10-CM | POA: Diagnosis not present

## 2023-02-04 DIAGNOSIS — M722 Plantar fascial fibromatosis: Secondary | ICD-10-CM | POA: Diagnosis not present

## 2023-02-04 MED ORDER — CICLOPIROX 8 % EX SOLN: Freq: Every day | CUTANEOUS | 0 refills | Status: DC

## 2023-02-04 NOTE — Progress Notes (Signed)
Subjective:  Patient ID: Frederick Guzman, male    DOB: July 03, 1950,   MRN: [id number]  Chief Complaint  Patient presents with   Plantar Fasciitis    PT PRESENTS FOR A PF FOLLOW UP STATES HE IS DOING BETTER HE STILL GETS PAIN SOME PAIN WHEN HE WEAR CERTAIN SHOES BUT THE PAIN CAN BE TOLERATED.     72 y.o. male presents for  follow-up of plantar fasciitis and fungal nails. Here to review culture. Relates the plantar fasciitis is doing much better has been stretching and wearing the brace.  Denies burning and tingling in their feet. Patient is diabetic and last A1c was  Lab Results  Component Value Date   HGBA1C 5.6 12/01/2022   PCP:  Everrett Coombe, DO    . Denies any other pedal complaints. Denies n/v/f/c.   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   Arthritis    OA   Arthritis of wrist 05/28/2011   Biliary obstruction 09/04/2021   BPH (benign prostatic hyperplasia) 06/17/2011   BRONCHITIS, CHRONIC 03/10/2007   Qualifier: Diagnosis of   By: Craige Cotta MD, Vineet       Cancer [facility]) 08/2014   melanoma on R shoulder & back - [facility]    Combined forms of age-related cataract of right eye 09/21/2018   COPD (chronic obstructive pulmonary disease) (HCC)    COPD with exacerbation (HCC) 04/13/2021   Diverticulitis    Dyspnea    with exertion   Elevated liver enzymes 09/03/2021   Generalized abdominal pain    GERD (gastroesophageal reflux disease)    HTN (hypertension)    Hyperlipemia    Hyperlipidemia 08/07/2020   Hypogonadism male 06/17/2011   Long term use of drug 04/19/2017   Lymphocytic colitis 04/2018   MDD (major depressive disorder) (HCC) 04/30/2011   OBSTRUCTIVE SLEEP APNEA 03/10/2007   Qualifier: Diagnosis of   By: Craige Cotta MD, Vineet       Pain in joint of left shoulder 04/19/2017   Pain in right hand 08/04/2017   Pneumonia 06/2015, 10/2016   Positive depression screening 09/21/2016    Pulmonary nodules 10/23/2017   S/P laparoscopic cholecystectomy 09/03/2021   S/P shoulder replacement, left 01/30/2016   Status post shoulder surgery 08/09/2016   TOBACCO ABUSE 03/10/2007   Qualifier: Diagnosis of   By: Craige Cotta MD, Vineet       Transaminitis 09/03/2021   Uncontrolled type 2 diabetes mellitus with hyperglycemia, without long-term current use of insulin (HCC) 09/21/2016   Likely will need dual or triple therapy but will start with metformin alone in case of intolerance. Close follow-up with home blood sugar readings. ?steroid rol    Objective:  Physical Exam: Vascular: DP/PT pulses 2/4 bilateral. CFT <3 seconds. Normal hair growth on digits. No edema.  Skin. No lacerations or abrasions bilateral feet. Nails 1-5 bilateral thickened and elongated with subungual debris.  Musculoskeletal: MMT 5/5 bilateral lower extremities in DF, PF, Inversion and Eversion. Deceased ROM in DF of ankle joint. Minimally tender to the medial calcaneal tubercle left . No pain with achilles, PT or arch. No pain with calcaneal squeeze.  Neurological: Sensation intact to light touch.   Assessment:   1. Plantar fasciitis, left   2. Onychomycosis       Plan:  Patient was evaluated and treated and all questions answered. Discussed plantar fasciitis with patient.  X-rays reviewed and discussed with patient. No acute fractures  or dislocations noted. Mild spurring noted at inferior calcaneus.  Discussed treatment options including, ice, NSAIDS, supportive shoes, bracing, and stretching.  Continue brace and stretching.  -Discussed treatment options for painful dystrophic nails  -Culture positive for fungus  -Discussed fungal nail treatment options including oral, topical, and laser treatments.  -Patient elects to try topical medication. Unable to do oral due to his history of elevated LFTs.  -Penlac sent to pharmacy.  Follow-up 6 weeks for rfc.    Louann Sjogren, DPM

## 2023-02-10 ENCOUNTER — Other Ambulatory Visit: Payer: Self-pay | Admitting: Family Medicine

## 2023-02-10 DIAGNOSIS — I10 Essential (primary) hypertension: Secondary | ICD-10-CM

## 2023-02-21 NOTE — Progress Notes (Signed)
   02/21/2023  Patient ID: Frederick Guzman, male   DOB: [birth year], 72 y.o.   MRN: [id number]  Submitted 2025 Nov[employer] PAP re-enrollment application for Ozempic 1mg  online, so patient can continue to receive medication from manufacturer at no cost.  Lenna Gilford, PharmD, DPLA

## 2023-02-23 ENCOUNTER — Other Ambulatory Visit: Payer: Self-pay | Admitting: Family Medicine

## 2023-02-27 ENCOUNTER — Encounter: Payer: Self-pay | Admitting: Family Medicine

## 2023-02-28 ENCOUNTER — Other Ambulatory Visit: Payer: Self-pay

## 2023-02-28 ENCOUNTER — Telehealth: Payer: Self-pay

## 2023-02-28 ENCOUNTER — Other Ambulatory Visit (HOSPITAL_COMMUNITY): Payer: Self-pay

## 2023-02-28 MED ORDER — LANTUS SOLOSTAR 100 UNIT/ML ~~LOC~~ SOPN
20.0000 [IU] | PEN_INJECTOR | Freq: Two times a day (BID) | SUBCUTANEOUS | 1 refills | Status: DC
  Filled 2023-02-28 (×2): qty 12, 30d supply, fill #0
  Filled 2023-03-24: qty 12, 30d supply, fill #1

## 2023-02-28 NOTE — Progress Notes (Signed)
   02/28/2023  Patient ID: Frederick Guzman, male   DOB: Feb 23, 1951, 72 y.o.   MRN: [id number]  Missed call/voicemail from patient regarding insulin prescription.  His pharmacy does not break boxes of insulin; so based on how he is using Lantus, 1 box is a 37 day supply.  Insurance is charging 2 copays (total $70) based on this day supply.  Contacted [facility], and they will break boxes of insulin.  Patient is also enrolled in Novo PAP, which could supply [place] at no cost. Patient used his last bit of Lantus this morning and is open to changing to [place] if Dr. Ashley Royalty agrees.  Will consult Dr. Ashley Royalty and see if [facility] has a long-acting insulin sample patient could get today.  Lenna Gilford, PharmD, DPLA

## 2023-02-28 NOTE — Progress Notes (Signed)
   02/28/2023  Patient ID: Frederick Guzman, male   DOB: 1950-06-04, 72 y.o.   MRN: [id number]  Dr. Ashley Royalty is okay with patient changing to Tresiba from Lantus for PAP purposes, but the office does not have any long-acting insulin samples.  I am working on order form to add [place] to [employer] PAP, and he would like Lantus sent to [facility] for filling in the meantime.  Order pending for Dr. Ashley Royalty to sign if in agreement.  Lenna Gilford, PharmD, DPLA

## 2023-03-03 ENCOUNTER — Encounter: Payer: Self-pay | Admitting: Family Medicine

## 2023-03-03 ENCOUNTER — Telehealth: Payer: Self-pay

## 2023-03-03 ENCOUNTER — Other Ambulatory Visit: Payer: Self-pay | Admitting: Family Medicine

## 2023-03-03 ENCOUNTER — Ambulatory Visit: Payer: Medicare Other | Admitting: Family Medicine

## 2023-03-03 VITALS — BP 110/67 | HR 98 | Ht 72.0 in | Wt 204.0 lb

## 2023-03-03 DIAGNOSIS — Z794 Long term (current) use of insulin: Secondary | ICD-10-CM | POA: Diagnosis not present

## 2023-03-03 DIAGNOSIS — E1165 Type 2 diabetes mellitus with hyperglycemia: Secondary | ICD-10-CM

## 2023-03-03 DIAGNOSIS — N400 Enlarged prostate without lower urinary tract symptoms: Secondary | ICD-10-CM

## 2023-03-03 DIAGNOSIS — E291 Testicular hypofunction: Secondary | ICD-10-CM

## 2023-03-03 LAB — POCT GLYCOSYLATED HEMOGLOBIN (HGB A1C): HbA1c, POC (controlled diabetic range): 5.9 % (ref 0.0–7.0)

## 2023-03-03 LAB — POCT UA - MICROALBUMIN
Albumin/Creatinine Ratio, Urine, POC: <30
Creatinine, POC: 50 mg/dL
Microalbumin Ur, POC: 10 mg/L

## 2023-03-03 NOTE — Assessment & Plan Note (Signed)
Flomax remains effective for lower urinary tract symptoms.  Will plan to continue this.

## 2023-03-03 NOTE — Telephone Encounter (Signed)
Patient dropped off [employer] Patient Surveyor, quantity, form placed in folder outside of hallway office, thanks.

## 2023-03-03 NOTE — Progress Notes (Signed)
Frederick Guzman - 72 y.o. male MRN [id number]  Date of birth: [birth year]  Subjective Chief Complaint  Patient presents with   Diabetes    HPI Frederick Guzman is a 72 y.o. male here today for follow up.   He reports that he is doing pretty well.    Remains on lantus 20 units BID and ozempic 1mg .  He will transitioning to [place] through patient assistance plan soon.  Blood sugars at home have been. Well controlled with current medications. He tries to stay fairly active.  Tolerating atorvastatin well for management of associated HLD.   BP has not really been an issue.  He is on lisinopril 2.5mg  for renal protection.   Mood stable with bupropion.  No significant side effects with this.   He is having some increased issues with ED despite use of sildenafil.  He remains on testosterone therapy.   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   Arthritis    OA   Arthritis of wrist 05/28/2011   Biliary obstruction 09/04/2021   BPH (benign prostatic hyperplasia) 06/17/2011   BRONCHITIS, CHRONIC 03/10/2007   Qualifier: Diagnosis of   By: Craige Cotta MD, Vineet       Cancer [facility]) 08/2014   melanoma on R shoulder & back - [facility]    Combined forms of age-related cataract of right eye 09/21/2018   COPD (chronic obstructive pulmonary disease) (HCC)    COPD with exacerbation (HCC) 04/13/2021   Diverticulitis    Dyspnea    with exertion   Elevated liver enzymes 09/03/2021   Generalized abdominal pain    GERD (gastroesophageal reflux disease)    HTN (hypertension)    Hyperlipemia     Hyperlipidemia 08/07/2020   Hypogonadism male 06/17/2011   Long term use of drug 04/19/2017   Lymphocytic colitis 04/2018   MDD (major depressive disorder) (HCC) 04/30/2011   OBSTRUCTIVE SLEEP APNEA 03/10/2007   Qualifier: Diagnosis of   By: Craige Cotta MD, Vineet       Pain in joint of left shoulder 04/19/2017   Pain in right hand 08/04/2017   Pneumonia 06/2015, 10/2016   Positive depression screening 09/21/2016   Pulmonary nodules 10/23/2017   S/P laparoscopic cholecystectomy 09/03/2021   S/P shoulder replacement, left 01/30/2016   Status post shoulder surgery 08/09/2016   TOBACCO ABUSE 03/10/2007   Qualifier: Diagnosis of   By: Craige Cotta MD, Vineet       Transaminitis 09/03/2021   Uncontrolled type 2 diabetes mellitus with hyperglycemia, without long-term current use of insulin (HCC) 09/21/2016   Likely will need dual or triple therapy but will start with metformin alone in case of intolerance. Close follow-up with home blood sugar readings. ?steroid rol    Past Surgical History:  Procedure Laterality Date   carpel tunnel Bilateral    CHOLECYSTECTOMY N/A 08/31/2021   Procedure: LAPAROSCOPIC CHOLECYSTECTOMY AND UMBILICAL HERNIA REPAIR;  Surgeon: Manus Rudd, MD;  Location: [facility];  Service: General;  Laterality: N/A;   COLONOSCOPY W/ POLYPECTOMY     COLONOSCOPY W/ POLYPECTOMY     EXCISIONAL TOTAL  SHOULDER ARTHROPLASTY WITH ANTIBIOTIC SPACER Left 08/09/2016   Procedure: LEFT SHOULDER HARDWARE REMOVAL, IRRIGATION AND DEBRIDEMENT, PLACEMENT OF ANTIBIOTIC SPACER;  Surgeon: Beverely Low, MD;  Location: [facility] OR;  Service: Orthopedics;  Laterality: Left;   IRRIGATION AND DEBRIDEMENT SHOULDER Left 03/31/2016   Procedure: IRRIGATION AND DEBRIDEMENT SHOULDER, deep cultures and polyethylene exchange;  Surgeon: Beverely Low, MD;  Location: [facility] OR;  Service: Orthopedics;  Laterality: Left;  requests   REVERSE SHOULDER ARTHROPLASTY Left 01/30/2016   Procedure: LEFT REVERSE SHOULDER ARTHROPLASTY;   Surgeon: Beverely Low, MD;  Location: [facility] OR;  Service: Orthopedics;  Laterality: Left;   ROTATOR CUFF REPAIR Left    SHOULDER ARTHROSCOPY WITH DISTAL CLAVICLE RESECTION Right 01/30/2018   Procedure: RIGHT SHOULDER ARTHROSCOPY WITH OPEN DISTAL CLAVICLE RESECTION, SUBACROMIAL DECOMPRESSION;  Surgeon: Beverely Low, MD;  Location: [facility] OR;  Service: Orthopedics;  Laterality: Right;   STERIOD INJECTION Right 01/30/2018   Procedure: STEROID INJECTION;  Surgeon: Beverely Low, MD;  Location: [facility] OR;  Service: Orthopedics;  Laterality: Right;   TOTAL SHOULDER REVISION Left 01/17/2017   Procedure: LEFT SHOULDER REVISION REVERSE TOTAL SHOULDER ARTHROPLASTY;  Surgeon: Beverely Low, MD;  Location: [facility] OR;  Service: Orthopedics;  Laterality: Left;   UMBILICAL HERNIA REPAIR N/A 08/31/2021   Procedure: HERNIA REPAIR UMBILICAL ADULT possible;  Surgeon: Manus Rudd, MD;  Location: [facility];  Service: General;  Laterality: N/A;   WRIST SURGERY Right 11/2014   Dr. Amanda Pea    Social History   Socioeconomic History   Marital status: Domestic Partner    Spouse name: Thurston Hole   Number of children: 2   Years of education: 12   Highest education level: 12th grade  Occupational History   Occupation: lineman    Comment: retired  Tobacco Use   Smoking status: Former    Current packs/day: 0.00    Average packs/day: 0.8 packs/day for 43.0 years (32.3 ttl pk-yrs)    Types: Cigarettes    Start date: 03/1979    Quit date: 03/2022    Years since quitting: 0.9   Smokeless tobacco: Never   Tobacco comments:    pt no longer taking chantix  Vaping Use   Vaping status: Never Used  Substance and Sexual Activity   Alcohol use: Yes    Alcohol/week: 9.0 standard drinks of alcohol    Types: 6 Cans of beer, 3 Shots of liquor per week    Comment: beer or mixed drink    Drug use: No   Sexual activity: Yes  Other Topics Concern   Not on file  Social History Narrative   Lives with his girlfriend, Thurston Hole. Retired. DOes a lot  of yard work, travelling in the RV and handy work. Members of the camping club.   Social Drivers of Health   Financial Resource Strain: Medium Risk (03/02/2023)   Overall Financial Resource Strain (CARDIA)    Difficulty of Paying Living Expenses: Somewhat hard  Food Insecurity: No Food Insecurity (03/02/2023)   Hunger Vital Sign    Worried About Running Out of Food in the Last Year: Never true    Ran Out of Food in the Last Year: Never true  Transportation Needs: No Transportation Needs (03/02/2023)   PRAPARE - Administrator, Civil Service (Medical): No    Lack of Transportation (Non-Medical): No  Physical Activity: Sufficiently Active (03/02/2023)   Exercise Vital Sign    Days of Exercise per Week: 3 days    Minutes of Exercise per Session: 60 min  Stress:  Stress Concern Present (03/02/2023)   [employer] of Occupational Health - Occupational Stress Questionnaire    Feeling of Stress : To some extent  Social Connections: Socially Integrated (03/02/2023)   Social Connection and Isolation Panel [NHANES]    Frequency of Communication with Friends and Family: More than three times a week    Frequency of Social Gatherings with Friends and Family: Twice a week    Attends Religious Services: More than 4 times per year    Active Member of Clubs or Organizations: Yes    Attends Engineer, structural: More than 4 times per year    Marital Status: Living with partner    Family History  Problem Relation Age of Onset   Diabetes Mother    Colon cancer Father    Prostate cancer Father    Diabetes Father    Esophageal cancer Neg Hx    Rectal cancer Neg Hx    Stomach cancer Neg Hx     Health Maintenance  Topic Date Due   COVID-19 Vaccine (7 - 2024-25 season) 11/21/2022   FOOT EXAM  04/14/2023   Lung Cancer Screening  05/11/2023   Medicare Annual Wellness (AWV)  08/11/2023   Diabetic kidney evaluation - eGFR measurement  08/12/2023   HEMOGLOBIN A1C   09/01/2023   OPHTHALMOLOGY EXAM  12/02/2023   Diabetic kidney evaluation - Urine ACR  03/02/2024   Colonoscopy  05/11/2025   DTaP/Tdap/Td (3 - Td or Tdap) 02/18/2029   Pneumonia Vaccine 54+ Years old  Completed   INFLUENZA VACCINE  Completed   Hepatitis C Screening  Completed   Zoster Vaccines- Shingrix  Completed   HPV VACCINES  Aged Out     ----------------------------------------------------------------------------------------------------------------------------------------------------------------------------------------------------------------- Physical Exam BP 110/67 (BP Location: Left Arm, Patient Position: Sitting, Cuff Size: Normal)   Pulse 98   Ht 6' (1.829 m)   Wt 204 lb (92.5 kg)   SpO2 96%   BMI 27.67 kg/m   Physical Exam Constitutional:      Appearance: Normal appearance.  Eyes:     General: No scleral icterus. Cardiovascular:     Rate and Rhythm: Normal rate and regular rhythm.  Pulmonary:     Effort: Pulmonary effort is normal.     Breath sounds: Normal breath sounds.  Musculoskeletal:     Cervical back: Neck supple.  Neurological:     Mental Status: He is alert.  Psychiatric:        Mood and Affect: Mood normal.        Behavior: Behavior normal.     ------------------------------------------------------------------------------------------------------------------------------------------------------------------------------------------------------------------- Assessment and Plan  BPH (benign prostatic hyperplasia) Flomax remains effective for lower urinary tract symptoms.  Will plan to continue this.  Hypogonadism male Doing pretty well with topical testosterone.  He is having some increased ED symptoms.  Rechecking testosterone levels.  Uncontrolled type 2 diabetes mellitus with hyperglycemia, without long-term current use of insulin (HCC) Diabetes is well-controlled.  He will continue with current medications.  Will work on transitioning him to  [place] through patient assistance will continue Lantus until he receives this.  Hopefully we can down titrate his insulin somewhat next visit.   No orders of the defined types were placed in this encounter.   Return in about 4 months (around 07/02/2023) for Type 2 Diabetes.    This visit occurred during the SARS-CoV-2 public health emergency.  Safety protocols were in place, including screening questions prior to the visit, additional usage of staff PPE, and extensive cleaning of exam room  while observing appropriate contact time as indicated for disinfecting solutions.

## 2023-03-03 NOTE — Assessment & Plan Note (Signed)
Doing pretty well with topical testosterone.  He is having some increased ED symptoms.  Rechecking testosterone levels.

## 2023-03-03 NOTE — Assessment & Plan Note (Signed)
Diabetes is well-controlled.  He will continue with current medications.  Will work on transitioning him to [place] through patient assistance will continue Lantus until he receives this.  Hopefully we can down titrate his insulin somewhat next visit.

## 2023-03-04 LAB — TESTOSTERONE: Testosterone: 707 ng/dL (ref 264–916)

## 2023-03-04 LAB — PSA: Prostate Specific Ag, Serum: 2 ng/mL (ref 0.0–4.0)

## 2023-03-08 ENCOUNTER — Other Ambulatory Visit: Payer: Self-pay | Admitting: Family Medicine

## 2023-03-11 NOTE — Progress Notes (Signed)
   03/11/2023  Patient ID: Frederick Guzman, male   DOB: Apr 05, 1950, 72 y.o.   MRN: [id number]  Received fax from [employer] stating additional insurance information was needed to complete processing of patient 2025 re-enrollment.  Contacted [employer] to provide this information and check on addition of Tresiba/pen needles to patient's PAP.    Re-enrollment application is now complete and in benefit verification, which can take 2-3 business days.  Program states a new re-enrollment application will need to be submitted with [place] and Ozempic listed, so patient can receive both medications next year.  I will complete this online.  Contacted patient to notify, and he informed me that Dr. Anastasio Auerbach sent refill order for his testosterone over to his pharmacy that is requiring a prior authorization.  I have submitted a PA request to his insurance via CMM.  Lenna Gilford, PharmD, DPLA

## 2023-03-14 ENCOUNTER — Telehealth: Payer: Self-pay

## 2023-03-14 ENCOUNTER — Other Ambulatory Visit: Payer: Self-pay

## 2023-03-14 NOTE — Telephone Encounter (Signed)
Copied from CRM [id number]. Topic: Clinical - Home Health Verbal Orders >> Mar 11, 2023  5:03 PM Antony Haste wrote: Reason for CRM: Turkey from [facility] called to confirm that she will be faxing over paperwork for PT's prior authorization approval for testosterone prescription. It has been approved as of 03/11/2023 and will be valid for a year. Provided Turkey with fax #.   Callback #: [phone]

## 2023-03-14 NOTE — Telephone Encounter (Signed)
Copied from CRM [id number]. Topic: Clinical - Prescription Issue >> Mar 14, 2023 11:29 AM Fonda Kinder J wrote: Reason for CRM: Pt's wife states that preferred pharmacy won't have his Testosterone 30 MG in stock until after the first of the year and the pt is out of the medication.She called [facility] and they can have it filled by Monday, she wants to know if the prescription can be sent to that pharmacy instead so that the pt can get his medication

## 2023-03-14 NOTE — Progress Notes (Signed)
   03/14/2023  Patient ID: Frederick Guzman, male   DOB: [birth year], 72 y.o.   MRN: [id number]  Prior authorization has been approved for patient's Testosterone per CMM.  Contacted patient's pharmacy to verify coverage, and the medication is going through for $9 now; but the pharmacy is unsure if they are able to get the medication.  They state they will contact patient if they are not able to get the medication in.  Sending patient a MyChart message to inform and suggest reaching out to other pharmacies to see if they have/can get medication if [facility] cannot.    Lenna Gilford, PharmD, DPLA

## 2023-03-14 NOTE — Telephone Encounter (Signed)
Pended medication

## 2023-03-17 ENCOUNTER — Telehealth: Payer: Self-pay

## 2023-03-17 MED ORDER — TESTOSTERONE 30 MG/ACT TD SOLN: TRANSDERMAL | 0 refills | Status: DC

## 2023-03-17 NOTE — Telephone Encounter (Signed)
Initiated Prior authorization ION:[id number] 30MG /ACT solution Via: Covermymeds Case/Key:[id number] Status: n/a as of 03/17/23 Reason:[employer] [place] Medicare message:Prior Authorization Not Required Notified Pt via: Mychart

## 2023-03-18 ENCOUNTER — Encounter: Payer: Self-pay | Admitting: Podiatry

## 2023-03-18 ENCOUNTER — Ambulatory Visit (INDEPENDENT_AMBULATORY_CARE_PROVIDER_SITE_OTHER): Payer: Medicare Other | Admitting: Podiatry

## 2023-03-18 DIAGNOSIS — B351 Tinea unguium: Secondary | ICD-10-CM

## 2023-03-18 DIAGNOSIS — M79675 Pain in left toe(s): Secondary | ICD-10-CM

## 2023-03-18 DIAGNOSIS — E119 Type 2 diabetes mellitus without complications: Secondary | ICD-10-CM

## 2023-03-18 DIAGNOSIS — M79674 Pain in right toe(s): Secondary | ICD-10-CM | POA: Diagnosis not present

## 2023-03-18 NOTE — Progress Notes (Signed)
Subjective:  Patient ID: Frederick Guzman, male    DOB: [birth year],   MRN: [id number]  No chief complaint on file.   72 y.o. male presents for multiple concerns including concern of thickened elongated and painful nails that are difficult to trim. Requesting to have them trimmed today. Denies burning and tingling in their feet. Patient is diabetic and last A1c was  Lab Results  Component Value Date   HGBA1C 5.9 03/03/2023   .  PCP:  Everrett Coombe, DO    . Denies any other pedal complaints. Denies n/v/f/c.   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   Arthritis    OA   Arthritis of wrist 05/28/2011   Biliary obstruction 09/04/2021   BPH (benign prostatic hyperplasia) 06/17/2011   BRONCHITIS, CHRONIC 03/10/2007   Qualifier: Diagnosis of   By: Craige Cotta MD, Vineet       Cancer [facility]) 08/2014   melanoma on R shoulder & back - [facility]    Combined forms of age-related cataract of right eye 09/21/2018   COPD (chronic obstructive pulmonary disease) (HCC)    COPD with exacerbation (HCC) 04/13/2021   Diverticulitis    Dyspnea    with exertion   Elevated liver enzymes 09/03/2021   Generalized abdominal pain    GERD (gastroesophageal reflux disease)    HTN (hypertension)    Hyperlipemia    Hyperlipidemia 08/07/2020   Hypogonadism male 06/17/2011   Long term use of drug 04/19/2017   Lymphocytic colitis 04/2018   MDD (major depressive disorder) (HCC) 04/30/2011   OBSTRUCTIVE SLEEP APNEA 03/10/2007   Qualifier: Diagnosis of   By: Craige Cotta MD, Vineet       Pain in joint of left shoulder 04/19/2017   Pain in right hand 08/04/2017   Pneumonia 06/2015, 10/2016   Positive depression screening 09/21/2016   Pulmonary nodules 10/23/2017   S/P laparoscopic cholecystectomy 09/03/2021   S/P shoulder replacement, left 01/30/2016   Status post shoulder surgery 08/09/2016   TOBACCO ABUSE 03/10/2007    Qualifier: Diagnosis of   By: Craige Cotta MD, Vineet       Transaminitis 09/03/2021   Uncontrolled type 2 diabetes mellitus with hyperglycemia, without long-term current use of insulin (HCC) 09/21/2016   Likely will need dual or triple therapy but will start with metformin alone in case of intolerance. Close follow-up with home blood sugar readings. ?steroid rol    Objective:  Physical Exam: Vascular: DP/PT pulses 2/4 bilateral. CFT <3 seconds. Normal hair growth on digits. No edema.  Skin. No lacerations or abrasions bilateral feet. Nails 1-5 bilateral thickened and elongated with subungual debris.  Musculoskeletal: MMT 5/5 bilateral lower extremities in DF, PF, Inversion and Eversion. Deceased ROM in DF of ankle joint. Tender to the medial calcaneal tubercle left . No pain with achilles, PT or arch. No pain with calcaneal squeeze.  Neurological: Sensation intact to light touch.   Assessment:   1. Pain due to onychomycosis of toenails of both feet   2. Type 2 diabetes mellitus without complication, without long-term current use of insulin (HCC)       Plan:  Patient was evaluated and treated and all questions answered. -Discussed and educated patient on diabetic foot care, especially with  regards to the vascular, neurological and musculoskeletal systems.  -Stressed the importance of good glycemic control and the detriment of not  controlling glucose levels in relation to the foot. -Discussed  supportive shoes at all times and checking feet regularly.  -Mechanically debrided all nails 1-5 bilateral using sterile nail nipper and filed with dremel without incident  -Examined patient Follow-up 3months for rfc.    Louann Sjogren, DPM

## 2023-03-18 NOTE — Telephone Encounter (Signed)
Pharmacy informed and medication did process through insurance.

## 2023-03-21 DIAGNOSIS — L570 Actinic keratosis: Secondary | ICD-10-CM | POA: Diagnosis not present

## 2023-03-21 DIAGNOSIS — Z85828 Personal history of other malignant neoplasm of skin: Secondary | ICD-10-CM | POA: Diagnosis not present

## 2023-03-21 DIAGNOSIS — D2262 Melanocytic nevi of left upper limb, including shoulder: Secondary | ICD-10-CM | POA: Diagnosis not present

## 2023-03-21 DIAGNOSIS — L821 Other seborrheic keratosis: Secondary | ICD-10-CM | POA: Diagnosis not present

## 2023-03-21 DIAGNOSIS — L738 Other specified follicular disorders: Secondary | ICD-10-CM | POA: Diagnosis not present

## 2023-03-21 DIAGNOSIS — D044 Carcinoma in situ of skin of scalp and neck: Secondary | ICD-10-CM | POA: Diagnosis not present

## 2023-03-21 DIAGNOSIS — C44519 Basal cell carcinoma of skin of other part of trunk: Secondary | ICD-10-CM | POA: Diagnosis not present

## 2023-03-21 DIAGNOSIS — D2371 Other benign neoplasm of skin of right lower limb, including hip: Secondary | ICD-10-CM | POA: Diagnosis not present

## 2023-03-21 DIAGNOSIS — L820 Inflamed seborrheic keratosis: Secondary | ICD-10-CM | POA: Diagnosis not present

## 2023-03-21 DIAGNOSIS — Z85820 Personal history of malignant melanoma of skin: Secondary | ICD-10-CM | POA: Diagnosis not present

## 2023-03-21 DIAGNOSIS — D225 Melanocytic nevi of trunk: Secondary | ICD-10-CM | POA: Diagnosis not present

## 2023-03-21 DIAGNOSIS — D224 Melanocytic nevi of scalp and neck: Secondary | ICD-10-CM | POA: Diagnosis not present

## 2023-03-28 ENCOUNTER — Other Ambulatory Visit: Payer: Self-pay | Admitting: Cardiology

## 2023-03-28 DIAGNOSIS — E782 Mixed hyperlipidemia: Secondary | ICD-10-CM

## 2023-03-30 NOTE — Progress Notes (Signed)
   03/30/2023  Patient ID: Frederick Guzman, male   DOB: 06-29-1950, 73 y.o.   MRN: [id number]  Contacted [employer] to check on status of Tresiba , which was recently added to patient's assistance program benefit to replace Lantus .  Patient has been approved to receive Tresiba  along with Ozempic  from [employer] [facility], and this should arrive at [facility] in the next 10-14 business days.  Sending patient a MyChart message to notify and remind him this will replace the Lantus  he has been taking.   [name], PharmD, DPLA

## 2023-04-04 NOTE — Progress Notes (Signed)
   04/04/2023  Patient ID: Frederick Guzman, male   DOB: 11-27-1950, 73 y.o.   MRN: [id number]  Incoming message from patient stating he took his last dose of Ozempic  Friday and is in need of a refill for this week's dose.  Contacted [employer], and they state the medication should arrive at [facility] the end of this week or beginning of next week.  Seding patient MyChart response to inform.  Frederick Guzman, PharmD, DPLA

## 2023-04-11 ENCOUNTER — Encounter: Payer: Self-pay | Admitting: Family Medicine

## 2023-04-26 ENCOUNTER — Other Ambulatory Visit: Payer: Self-pay

## 2023-04-26 ENCOUNTER — Telehealth: Payer: Self-pay

## 2023-04-26 DIAGNOSIS — E1165 Type 2 diabetes mellitus with hyperglycemia: Secondary | ICD-10-CM

## 2023-04-26 NOTE — Progress Notes (Addendum)
   04/26/2023  Patient ID: Frederick Guzman, male   DOB: 1951/01/29, 73 y.o.   MRN: [id number]  Contact [employer] to get 30 day vouchers for Ozempic , Tresiba , and pen needles.  Patient is out of Ozempic  nd down to a few days of Tresiba  due to delivery delays of these PAP medications.    Orders pending with 30 day voucher information for Dr. Alvia to sign if in agreement.  Ozempic  Bin [id number] Pcn cnrx Grp [id number] Id [id number]  Tresiba  Bin [id number] P[employer] cnrx Grp [id number] Id [id number]  Pen needles Bin [id number] P[employer] cnrx Grp [id number] Id [id number]  [name], PharmD, DPLA

## 2023-04-26 NOTE — Progress Notes (Signed)
   04/26/2023  Patient ID: Frederick Guzman, male   DOB: 1950-12-30, 73 y.o.   MRN: [id number]  Subjective/objective Telephone visit to follow-up on management of type 2 diabetes  Diabetes management -Current medications: Ozempic  1 mg weekly, Lantus  20 units twice daily -Patient is approved for [employer] patient assistance program for 2025 for Ozempic , Lantus , and pen needles; but program is delayed in sending deliveries of medications.  Patient is out of Ozempic  and down to half of a pen of Lantus .  Lantus  is being replaced by Tresiba  that we will be coming from [employer]. -Patient uses libre 2 for CGM -Last A1c 5.9%  Medication access/affordability -Patient reports receiving a letter in the mail from his insurance stating they would no longer cover his testosterone  pump or torsemide  -Prior authorization was recently approved for testosterone   Assessment/plan  Diabetes management -Currently controlled -Continue current regimen -Contacting [employer] to see if I can get a 30-day voucher, so patient can pick up a 1 month supply at no cost from his pharmacy for Ozempic  1 mg, Tresiba , and pen needles  Medication access/affordability -Contacting patient's insurance plan to check on coverage for torsemide  and testosterone   Follow-up: Will notify patient regarding [employer] medications, testosterone , and torsemide -will also schedule 36-month follow-up  [name], PharmD, DPLA

## 2023-04-27 MED ORDER — NOVOFINE PEN NEEDLE 32G X 6 MM MISC: 0 refills | Status: AC

## 2023-04-27 MED ORDER — TRESIBA FLEXTOUCH 100 UNIT/ML ~~LOC~~ SOPN: 20.0000 [IU] | PEN_INJECTOR | Freq: Two times a day (BID) | SUBCUTANEOUS | 0 refills | Status: AC

## 2023-04-27 MED ORDER — SEMAGLUTIDE (1 MG/DOSE) 4 MG/3ML ~~LOC~~ SOPN: 1.0000 mg | PEN_INJECTOR | SUBCUTANEOUS | 0 refills | Status: DC

## 2023-04-27 NOTE — Progress Notes (Signed)
   04/27/2023  Patient ID: Frederick Guzman, male   DOB: [birth year], 73 y.o.   MRN: [id number]  Attempted to contact patient's insurance in regard to letter he received stating they would no longer cover his currently prescribed testosterone  or torsemide , but I was never able to get a representative on the phone.  I was able to perform test claims, those; and torsemide  appears to go through with no issue.  Testosterone  is also going through for a $78 co-pay, and the insurance indicates prior authorization is still active for prescription.  Sending [name] a message to inform him that prescriptions for Tresiba , Ozempic , and pen needles were sent into his pharmacy with free 30-day voucher information.  I am also informing him to make sure to request refills and testosterone  approximately a week before he would run out of these medications; so I have time to address any insurance issues if these were to arise.  [name], PharmD, DPLA

## 2023-04-28 ENCOUNTER — Telehealth: Payer: Self-pay

## 2023-04-28 NOTE — Progress Notes (Signed)
   04/28/2023  Patient ID: Frederick Guzman, male   DOB: May 25, 1950, 73 y.o.   MRN: [id number]  Received a message from [name] stating pharmacy was not able to fill Ozempic  with 30-day voucher from [employer].  Contacted [employer], and they had not added and the voucher information to bill to.  This was done, and now 1 month supply of Ozempic  1 mg is going through at no cost.  Contacting the patient via MyChart message to make him aware.  [name], PharmD, DPLA

## 2023-05-06 ENCOUNTER — Other Ambulatory Visit: Payer: Self-pay | Admitting: Podiatry

## 2023-05-19 ENCOUNTER — Other Ambulatory Visit: Payer: Self-pay | Admitting: Cardiology

## 2023-05-19 DIAGNOSIS — E782 Mixed hyperlipidemia: Secondary | ICD-10-CM

## 2023-05-20 ENCOUNTER — Other Ambulatory Visit: Payer: Self-pay | Admitting: Family Medicine

## 2023-05-20 MED ORDER — ATORVASTATIN CALCIUM 40 MG PO TABS: 40.0000 mg | ORAL_TABLET | ORAL | 0 refills | Status: DC

## 2023-05-20 NOTE — Telephone Encounter (Signed)
 Rx refill sent to pharmacy.

## 2023-05-24 ENCOUNTER — Telehealth: Payer: Self-pay

## 2023-05-24 MED ORDER — OZEMPIC (1 MG/DOSE) 4 MG/3ML ~~LOC~~ SOPN: 1.0000 mg | PEN_INJECTOR | SUBCUTANEOUS | 0 refills | Status: DC

## 2023-05-24 NOTE — Progress Notes (Signed)
   05/24/2023  Patient ID: Frederick Guzman, male   DOB: [birth year], 73 y.o.   MRN: [id number]  Missed call/voicemail from patient's s/o, Ann (DPR), stating patient is out of Ozempic and has not yet received this from [employer] [facility].  Evaristo Bury from [employer] [facility] was delivered to [facility] today, but Ozempic was not included in this shipment.  Contacted [employer] to check on shipping status of Ozempic, and this is delayed; so they were able to provide the patient with another 30 day voucher:  [id number]  Pending prescription with this processing information for pharmacy to bill to.  Sending patient MyChart message to inform.  Lenna Gilford, PharmD, DPLA

## 2023-05-24 NOTE — Telephone Encounter (Signed)
 Forwarding to [place] as an Financial planner.  Frederick Guzman  [employer] PAP shipment for [place] FlexPen 100 u dose / 4 boxes received this morning. Please contact the patient to come and pick up their order today. Placed in the PAP fridge with patient identifier. Thanks in advance.   NDC: 5784-6962-95 LOT: [id number] EXP: 2027-05*-31

## 2023-05-25 NOTE — Progress Notes (Signed)
   05/25/2023  Patient ID: Frederick Guzman, male   DOB: [birth year], 73 y.o.   MRN: [id number]  Received a message from patient stating pharmacy was not able to fill 30-day voucher for Ozempic.  Contacted [employer], and they state they were initially billing the voucher for the pills for last month.  This has now been in process on the most recent voucher from [employer] and is going through at $0 co-pay per patient.  Sending MyChart message to make him aware.  Lenna Gilford, PharmD, DPLA

## 2023-05-25 NOTE — Telephone Encounter (Signed)
 Patient has been advised

## 2023-06-01 DIAGNOSIS — M65341 Trigger finger, right ring finger: Secondary | ICD-10-CM | POA: Insufficient documentation

## 2023-06-01 DIAGNOSIS — M65352 Trigger finger, left little finger: Secondary | ICD-10-CM | POA: Diagnosis not present

## 2023-06-01 DIAGNOSIS — M65342 Trigger finger, left ring finger: Secondary | ICD-10-CM | POA: Insufficient documentation

## 2023-06-01 DIAGNOSIS — M13831 Other specified arthritis, right wrist: Secondary | ICD-10-CM | POA: Diagnosis not present

## 2023-06-01 DIAGNOSIS — M65351 Trigger finger, right little finger: Secondary | ICD-10-CM | POA: Diagnosis not present

## 2023-06-01 DIAGNOSIS — M79641 Pain in right hand: Secondary | ICD-10-CM | POA: Diagnosis not present

## 2023-06-03 ENCOUNTER — Other Ambulatory Visit: Payer: Self-pay | Admitting: Family Medicine

## 2023-06-07 DIAGNOSIS — H35373 Puckering of macula, bilateral: Secondary | ICD-10-CM | POA: Diagnosis not present

## 2023-06-07 DIAGNOSIS — H31092 Other chorioretinal scars, left eye: Secondary | ICD-10-CM | POA: Diagnosis not present

## 2023-06-07 DIAGNOSIS — H40013 Open angle with borderline findings, low risk, bilateral: Secondary | ICD-10-CM | POA: Diagnosis not present

## 2023-06-13 ENCOUNTER — Telehealth: Payer: Self-pay

## 2023-06-13 NOTE — Telephone Encounter (Signed)
 Forwarding to [place] as an Financial planner.  [name]   [employer] PAP shipment for Ozempic 1 mg dose / 4 boxes and NovoFine 32g tip (5 boxes)received this morning. Please contact the patient to come and pick up their order today. Placed in the PAP fridge with patient identifier. Thanks in advance.   NDC: 1610-9604-54 LOT: [id number] EXP: 2025-12-19   LIST: [id number] LOT: [id number] EXP: 2027-10-20

## 2023-06-17 ENCOUNTER — Ambulatory Visit (INDEPENDENT_AMBULATORY_CARE_PROVIDER_SITE_OTHER): Payer: Medicare Other | Admitting: Podiatry

## 2023-06-17 DIAGNOSIS — M79674 Pain in right toe(s): Secondary | ICD-10-CM

## 2023-06-17 DIAGNOSIS — B351 Tinea unguium: Secondary | ICD-10-CM

## 2023-06-17 DIAGNOSIS — E119 Type 2 diabetes mellitus without complications: Secondary | ICD-10-CM | POA: Diagnosis not present

## 2023-06-17 DIAGNOSIS — M79675 Pain in left toe(s): Secondary | ICD-10-CM | POA: Diagnosis not present

## 2023-06-17 NOTE — Progress Notes (Signed)
 Subjective:  Patient ID: Frederick Guzman, male    DOB: Jul 02, 1950,   MRN: [id number]  No chief complaint on file.   73 y.o. male presents for multiple concerns including concern of thickened elongated and painful nails that are difficult to trim. Requesting to have them trimmed today. Denies burning and tingling in their feet. Patient is diabetic and last A1c was  Lab Results  Component Value Date   HGBA1C 5.9 03/03/2023   .  PCP:  Everrett Coombe, DO    . Denies any other pedal complaints. Denies n/v/f/c.   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   Arthritis    OA   Arthritis of wrist 05/28/2011   Biliary obstruction 09/04/2021   BPH (benign prostatic hyperplasia) 06/17/2011   BRONCHITIS, CHRONIC 03/10/2007   Qualifier: Diagnosis of   By: Craige Cotta MD, Vineet       Cancer [facility]) 08/2014   melanoma on R shoulder & back - [facility]    Combined forms of age-related cataract of right eye 09/21/2018   COPD (chronic obstructive pulmonary disease) (HCC)    COPD with exacerbation (HCC) 04/13/2021   Diverticulitis    Dyspnea    with exertion   Elevated liver enzymes 09/03/2021   Generalized abdominal pain    GERD (gastroesophageal reflux disease)    HTN (hypertension)    Hyperlipemia    Hyperlipidemia 08/07/2020   Hypogonadism male 06/17/2011   Long term use of drug 04/19/2017   Lymphocytic colitis 04/2018   MDD (major depressive disorder) (HCC) 04/30/2011   OBSTRUCTIVE SLEEP APNEA 03/10/2007   Qualifier: Diagnosis of   By: Craige Cotta MD, Vineet       Pain in joint of left shoulder 04/19/2017   Pain in right hand 08/04/2017   Pneumonia 06/2015, 10/2016   Positive depression screening 09/21/2016   Pulmonary nodules 10/23/2017   S/P laparoscopic cholecystectomy 09/03/2021   S/P shoulder replacement, left 01/30/2016   Status post shoulder surgery 08/09/2016   TOBACCO ABUSE 03/10/2007    Qualifier: Diagnosis of   By: Craige Cotta MD, Vineet       Transaminitis 09/03/2021   Uncontrolled type 2 diabetes mellitus with hyperglycemia, without long-term current use of insulin (HCC) 09/21/2016   Likely will need dual or triple therapy but will start with metformin alone in case of intolerance. Close follow-up with home blood sugar readings. ?steroid rol    Objective:  Physical Exam: Vascular: DP/PT pulses 2/4 bilateral. CFT <3 seconds. Normal hair growth on digits. No edema.  Skin. No lacerations or abrasions bilateral feet. Nails 1-5 bilateral thickened and elongated with subungual debris.  Musculoskeletal: MMT 5/5 bilateral lower extremities in DF, PF, Inversion and Eversion. Deceased ROM in DF of ankle joint. Tender to the medial calcaneal tubercle left . No pain with achilles, PT or arch. No pain with calcaneal squeeze.  Neurological: Sensation intact to light touch.   Assessment:   1. Pain due to onychomycosis of toenails of both feet   2. Type 2 diabetes mellitus without complication, without long-term current use of insulin (HCC)       Plan:  Patient was evaluated and treated and all questions answered. -Discussed and educated patient on diabetic foot care, especially with  regards to the vascular, neurological and musculoskeletal systems.  -Stressed the importance of good glycemic control and the detriment of not  controlling glucose levels in relation to the foot. -Discussed  supportive shoes at all times and checking feet regularly.  -Mechanically debrided all nails 1-5 bilateral using sterile nail nipper and filed with dremel without incident  Follow-up 3months for rfc.    Louann Sjogren, DPM

## 2023-06-20 NOTE — Telephone Encounter (Signed)
 Patient picked up his medication and needles on today 06/20/23, thanks.

## 2023-06-27 DIAGNOSIS — G4733 Obstructive sleep apnea (adult) (pediatric): Secondary | ICD-10-CM | POA: Diagnosis not present

## 2023-06-27 DIAGNOSIS — Z87891 Personal history of nicotine dependence: Secondary | ICD-10-CM | POA: Diagnosis not present

## 2023-06-27 DIAGNOSIS — J441 Chronic obstructive pulmonary disease with (acute) exacerbation: Secondary | ICD-10-CM | POA: Diagnosis not present

## 2023-06-27 DIAGNOSIS — R918 Other nonspecific abnormal finding of lung field: Secondary | ICD-10-CM | POA: Diagnosis not present

## 2023-06-27 DIAGNOSIS — J3089 Other allergic rhinitis: Secondary | ICD-10-CM | POA: Diagnosis not present

## 2023-06-29 ENCOUNTER — Encounter: Payer: Self-pay | Admitting: Family Medicine

## 2023-06-29 ENCOUNTER — Ambulatory Visit (INDEPENDENT_AMBULATORY_CARE_PROVIDER_SITE_OTHER): Payer: Medicare Other | Admitting: Family Medicine

## 2023-06-29 VITALS — BP 131/71 | HR 77 | Ht 72.0 in | Wt 200.0 lb

## 2023-06-29 DIAGNOSIS — F3341 Major depressive disorder, recurrent, in partial remission: Secondary | ICD-10-CM | POA: Diagnosis not present

## 2023-06-29 DIAGNOSIS — E782 Mixed hyperlipidemia: Secondary | ICD-10-CM

## 2023-06-29 DIAGNOSIS — E291 Testicular hypofunction: Secondary | ICD-10-CM

## 2023-06-29 DIAGNOSIS — F17210 Nicotine dependence, cigarettes, uncomplicated: Secondary | ICD-10-CM

## 2023-06-29 DIAGNOSIS — I10 Essential (primary) hypertension: Secondary | ICD-10-CM | POA: Diagnosis not present

## 2023-06-29 DIAGNOSIS — E1165 Type 2 diabetes mellitus with hyperglycemia: Secondary | ICD-10-CM | POA: Diagnosis not present

## 2023-06-29 DIAGNOSIS — J441 Chronic obstructive pulmonary disease with (acute) exacerbation: Secondary | ICD-10-CM | POA: Diagnosis not present

## 2023-06-29 LAB — POCT GLYCOSYLATED HEMOGLOBIN (HGB A1C): HbA1c, POC (controlled diabetic range): 6.2 % (ref 0.0–7.0)

## 2023-06-29 LAB — CT CHEST LUNG CANCER SCREENING LOW DOSE WO CONTRAST

## 2023-06-29 MED ORDER — ATORVASTATIN CALCIUM 40 MG PO TABS: 40.0000 mg | ORAL_TABLET | ORAL | 3 refills | Status: AC

## 2023-06-29 NOTE — Assessment & Plan Note (Signed)
 Blood pressure remains well-controlled.  He will continue current medications for management of hypertension.

## 2023-06-29 NOTE — Progress Notes (Signed)
 Frederick Guzman - 73 y.o. male MRN [id number]  Date of birth: [birth year]  Subjective Chief Complaint  Patient presents with   Medical Management of Chronic Issues   Diabetes   Hypertension   Depression    HPI Frederick Guzman is a 73 y.o. male here today for follow up visit.   He reports that he is doing ok.  He continues on combination of Ozempic and [place].  He is getting Ozempic through patient assistance at this time.  He is doing well with this at current strength.  His A1c today is 6.2%.  His weight is down 4 lbs.   He remains on lisinopril 2.5mg  for renal protection.   Breztri daily with albuterol prn remains efective for management of his COPD.  He denies side effects at this time. He is currently on azithromycin and prednisone for treatment of COPD exacerbation.  Feels pretty good with testosterone therapy at this time.  Continues to have ED issues.  Tadalafil is not very effective.    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   Arthritis    OA   Arthritis of wrist 05/28/2011   Biliary obstruction 09/04/2021   BPH (benign prostatic hyperplasia) 06/17/2011   BRONCHITIS, CHRONIC 03/10/2007   Qualifier: Diagnosis of   By: Craige Cotta MD, Vineet       Cancer [facility]) 08/2014   melanoma on R shoulder & back - [facility]    Combined forms of age-related cataract of right eye 09/21/2018   COPD (chronic obstructive pulmonary disease) (HCC)    COPD with exacerbation (HCC) 04/13/2021   Diverticulitis    Dyspnea    with exertion   Elevated liver enzymes 09/03/2021   Generalized abdominal  pain    GERD (gastroesophageal reflux disease)    HTN (hypertension)    Hyperlipemia    Hyperlipidemia 08/07/2020   Hypogonadism male 06/17/2011   Long term use of drug 04/19/2017   Lymphocytic colitis 04/2018   MDD (major depressive disorder) (HCC) 04/30/2011   OBSTRUCTIVE SLEEP APNEA 03/10/2007   Qualifier: Diagnosis of   By: Craige Cotta MD, Vineet       Pain in joint of left shoulder 04/19/2017   Pain in right hand 08/04/2017   Pneumonia 06/2015, 10/2016   Positive depression screening 09/21/2016   Pulmonary nodules 10/23/2017   S/P laparoscopic cholecystectomy 09/03/2021   S/P shoulder replacement, left 01/30/2016   Status post shoulder surgery 08/09/2016   TOBACCO ABUSE 03/10/2007   Qualifier: Diagnosis of   By: Craige Cotta MD, Vineet       Transaminitis 09/03/2021   Uncontrolled type 2 diabetes mellitus with hyperglycemia, without long-term current use of insulin (HCC) 09/21/2016   Likely will need dual or triple therapy but will start with metformin alone in case of intolerance. Close follow-up with home blood sugar readings. ?steroid rol    Past Surgical History:  Procedure Laterality Date   carpel tunnel Bilateral    CHOLECYSTECTOMY N/A 08/31/2021   Procedure: LAPAROSCOPIC CHOLECYSTECTOMY AND UMBILICAL HERNIA REPAIR;  Surgeon: Manus Rudd, MD;  Location: [facility] ORS;  Service: General;  Laterality: N/A;   COLONOSCOPY W/ POLYPECTOMY     COLONOSCOPY W/ POLYPECTOMY     EXCISIONAL TOTAL SHOULDER ARTHROPLASTY WITH ANTIBIOTIC SPACER Left 08/09/2016   Procedure: LEFT SHOULDER HARDWARE REMOVAL, IRRIGATION AND DEBRIDEMENT, PLACEMENT OF ANTIBIOTIC SPACER;  Surgeon: Beverely Low, MD;  Location: [facility] OR;  Service: Orthopedics;  Laterality: Left;   IRRIGATION AND DEBRIDEMENT SHOULDER Left 03/31/2016   Procedure: IRRIGATION AND DEBRIDEMENT SHOULDER, deep cultures and polyethylene exchange;  Surgeon: Beverely Low, MD;  Location: [facility] OR;  Service: Orthopedics;  Laterality: Left;  requests   REVERSE  SHOULDER ARTHROPLASTY Left 01/30/2016   Procedure: LEFT REVERSE SHOULDER ARTHROPLASTY;  Surgeon: Beverely Low, MD;  Location: [facility] OR;  Service: Orthopedics;  Laterality: Left;   ROTATOR CUFF REPAIR Left    SHOULDER ARTHROSCOPY WITH DISTAL CLAVICLE RESECTION Right 01/30/2018   Procedure: RIGHT SHOULDER ARTHROSCOPY WITH OPEN DISTAL CLAVICLE RESECTION, SUBACROMIAL DECOMPRESSION;  Surgeon: Beverely Low, MD;  Location: [facility] OR;  Service: Orthopedics;  Laterality: Right;   STERIOD INJECTION Right 01/30/2018   Procedure: STEROID INJECTION;  Surgeon: Beverely Low, MD;  Location: [facility] OR;  Service: Orthopedics;  Laterality: Right;   TOTAL SHOULDER REVISION Left 01/17/2017   Procedure: LEFT SHOULDER REVISION REVERSE TOTAL SHOULDER ARTHROPLASTY;  Surgeon: Beverely Low, MD;  Location: [facility] OR;  Service: Orthopedics;  Laterality: Left;   UMBILICAL HERNIA REPAIR N/A 08/31/2021   Procedure: HERNIA REPAIR UMBILICAL ADULT possible;  Surgeon: Manus Rudd, MD;  Location: [facility] ORS;  Service: General;  Laterality: N/A;   WRIST SURGERY Right 11/2014   Dr. Amanda Pea    Social History   Socioeconomic History   Marital status: Domestic Partner    Spouse name: Thurston Hole   Number of children: 2   Years of education: 12   Highest education level: 12th grade  Occupational History   Occupation: lineman    Comment: retired  Tobacco Use   Smoking status: Former    Current packs/day: 0.00    Average packs/day: 0.8 packs/day for 43.0 years (32.3 ttl pk-yrs)    Types: Cigarettes    Start date: 03/1979    Quit date: 03/2022    Years since quitting: 1.2   Smokeless tobacco: Never   Tobacco comments:    pt no longer taking chantix  Vaping Use   Vaping status: Never Used  Substance and Sexual Activity   Alcohol use: Yes    Alcohol/week: 9.0 standard drinks of alcohol    Types: 6 Cans of beer, 3 Shots of liquor per week    Comment: beer or mixed drink    Drug use: No   Sexual activity: Yes  Other Topics Concern   Not on  file  Social History Narrative   Lives with his girlfriend, Thurston Hole. Retired. DOes a lot of yard work, travelling in the RV and handy work. Members of the camping club.   Social Drivers of Health   Financial Resource Strain: Medium Risk (06/25/2023)   Overall Financial Resource Strain (CARDIA)    Difficulty of Paying Living Expenses: Somewhat hard  Food Insecurity: Food Insecurity Present (06/25/2023)   Hunger Vital Sign    Worried About Running Out of Food in the Last Year: Sometimes true    Ran Out of Food in the Last Year: Never true  Transportation Needs: No Transportation Needs (06/25/2023)   PRAPARE - Administrator, Civil Service (Medical): No    Lack of Transportation (Non-Medical): No  Physical Activity: Sufficiently Active (06/25/2023)   Exercise Vital Sign  Days of Exercise per Week: 5 days    Minutes of Exercise per Session: 150+ min  Stress: Stress Concern Present (06/25/2023)   [employer] of Occupational Health - Occupational Stress Questionnaire    Feeling of Stress : Rather much  Social Connections: Socially Integrated (06/25/2023)   Social Connection and Isolation Panel [NHANES]    Frequency of Communication with Friends and Family: More than three times a week    Frequency of Social Gatherings with Friends and Family: Twice a week    Attends Religious Services: More than 4 times per year    Active Member of Clubs or Organizations: Yes    Attends Engineer, structural: More than 4 times per year    Marital Status: Living with partner    Family History  Problem Relation Age of Onset   Diabetes Mother    Colon cancer Father    Prostate cancer Father    Diabetes Father    Esophageal cancer Neg Hx    Rectal cancer Neg Hx    Stomach cancer Neg Hx     Health Maintenance  Topic Date Due   COVID-19 Vaccine (7 - 2024-25 season) 11/21/2022   FOOT EXAM  04/14/2023   Lung Cancer Screening  05/11/2023   Medicare Annual Wellness (AWV)  08/11/2023    Diabetic kidney evaluation - eGFR measurement  08/12/2023   HEMOGLOBIN A1C  09/01/2023   INFLUENZA VACCINE  10/21/2023   OPHTHALMOLOGY EXAM  12/02/2023   Diabetic kidney evaluation - Urine ACR  03/02/2024   Colonoscopy  05/11/2025   DTaP/Tdap/Td (3 - Td or Tdap) 02/18/2029   Pneumonia Vaccine 5+ Years old  Completed   Hepatitis C Screening  Completed   Zoster Vaccines- Shingrix  Completed   HPV VACCINES  Aged Out     ----------------------------------------------------------------------------------------------------------------------------------------------------------------------------------------------------------------- Physical Exam BP 131/71 (BP Location: Left Arm, Patient Position: Sitting, Cuff Size: Normal)   Pulse 77   Ht 6' (1.829 m)   Wt 200 lb (90.7 kg)   SpO2 99%   BMI 27.12 kg/m   Physical Exam Constitutional:      Appearance: Normal appearance.  HENT:     Head: Normocephalic and atraumatic.  Eyes:     General: No scleral icterus. Cardiovascular:     Rate and Rhythm: Normal rate and regular rhythm.  Pulmonary:     Effort: Pulmonary effort is normal.     Breath sounds: Normal breath sounds.  Musculoskeletal:     Cervical back: Neck supple.  Neurological:     Mental Status: He is alert.  Psychiatric:        Mood and Affect: Mood normal.        Behavior: Behavior normal.     ------------------------------------------------------------------------------------------------------------------------------------------------------------------------------------------------------------------- Assessment and Plan  HTN (hypertension) Blood pressure remains well-controlled.  He will continue current medications for management of hypertension.  Uncontrolled type 2 diabetes mellitus with hyperglycemia, without long-term current use of insulin (HCC) Diabetes is well-controlled.  He will continue with current medications.  Continue current medications at this time for  management of Diabetes.   Hypogonadism male Doing pretty well with topical testosterone.  He is having some increased ED symptoms.  Rechecking testosterone levels.  Discussed use of ring and pump since medication do not seem to be helpful.   MDD (major depressive disorder) (HCC) Continues on bupropion.  Stable at this time.  COPD (chronic obstructive pulmonary disease) (HCC) He has COPD along with asthma overlap.  He will continue on Breztri.  Continue albuterol  as needed. Followed by pulmonology.    Meds ordered this encounter  Medications   atorvastatin (LIPITOR) 40 MG tablet    Sig: Take 1 tablet (40 mg total) by mouth every other day.    Dispense:  90 tablet    Refill:  3    No follow-ups on file.

## 2023-06-29 NOTE — Addendum Note (Signed)
 Addended by: Ardyth Man on: 06/29/2023 11:23 AM   Modules accepted: Orders

## 2023-06-29 NOTE — Assessment & Plan Note (Signed)
 Diabetes is well-controlled.  He will continue with current medications.  Continue current medications at this time for management of Diabetes.

## 2023-06-29 NOTE — Assessment & Plan Note (Signed)
 He has COPD along with asthma overlap.  He will continue on Breztri.  Continue albuterol as needed. Followed by pulmonology.

## 2023-06-29 NOTE — Assessment & Plan Note (Signed)
 Doing pretty well with topical testosterone.  He is having some increased ED symptoms.  Rechecking testosterone levels.  Discussed use of ring and pump since medication do not seem to be helpful.

## 2023-06-29 NOTE — Assessment & Plan Note (Signed)
Continues on bupropion.  Stable at this time.

## 2023-06-30 LAB — CMP14+EGFR
ALT: 29 IU/L (ref 0–44)
AST: 22 IU/L (ref 0–40)
Albumin: 4.1 g/dL (ref 3.8–4.8)
Alkaline Phosphatase: 102 IU/L (ref 44–121)
BUN/Creatinine Ratio: 19 (ref 10–24)
BUN: 23 mg/dL (ref 8–27)
Bilirubin Total: 0.4 mg/dL (ref 0.0–1.2)
CO2: 25 mmol/L (ref 20–29)
Calcium: 9.1 mg/dL (ref 8.6–10.2)
Chloride: 97 mmol/L (ref 96–106)
Creatinine, Ser: 1.2 mg/dL (ref 0.76–1.27)
Globulin, Total: 2.2 g/dL (ref 1.5–4.5)
Glucose: 162 mg/dL — ABNORMAL HIGH (ref 70–99)
Potassium: 3.8 mmol/L (ref 3.5–5.2)
Sodium: 141 mmol/L (ref 134–144)
Total Protein: 6.3 g/dL (ref 6.0–8.5)
eGFR: 64 mL/min/{1.73_m2} (ref >59)

## 2023-06-30 LAB — LIPID PANEL WITH LDL/HDL RATIO
Cholesterol, Total: 151 mg/dL (ref 100–199)
HDL: 52 mg/dL (ref >39)
LDL Chol Calc (NIH): 83 mg/dL (ref 0–99)
LDL/HDL Ratio: 1.6 ratio (ref 0.0–3.6)
Triglycerides: 82 mg/dL (ref 0–149)
VLDL Cholesterol Cal: 16 mg/dL (ref 5–40)

## 2023-06-30 LAB — CBC WITH DIFFERENTIAL/PLATELET
Basophils Absolute: 0 10*3/uL (ref 0.0–0.2)
Basos: 0 %
EOS (ABSOLUTE): 0 10*3/uL (ref 0.0–0.4)
Eos: 0 %
Hematocrit: 43 % (ref 37.5–51.0)
Hemoglobin: 14.4 g/dL (ref 13.0–17.7)
Immature Grans (Abs): 0 10*3/uL (ref 0.0–0.1)
Immature Granulocytes: 0 %
Lymphocytes Absolute: 1.8 10*3/uL (ref 0.7–3.1)
Lymphs: 19 %
MCH: 30.3 pg (ref 26.6–33.0)
MCHC: 33.5 g/dL (ref 31.5–35.7)
MCV: 91 fL (ref 79–97)
Monocytes Absolute: 0.5 10*3/uL (ref 0.1–0.9)
Monocytes: 6 %
Neutrophils Absolute: 7 10*3/uL (ref 1.4–7.0)
Neutrophils: 75 %
Platelets: 207 10*3/uL (ref 150–450)
RBC: 4.75 x10E6/uL (ref 4.14–5.80)
RDW: 13.5 % (ref 11.6–15.4)
WBC: 9.3 10*3/uL (ref 3.4–10.8)

## 2023-06-30 LAB — TESTOSTERONE: Testosterone: 966 ng/dL — ABNORMAL HIGH (ref 264–916)

## 2023-06-30 LAB — MICROALBUMIN / CREATININE URINE RATIO
Creatinine, Urine: 118 mg/dL
Microalb/Creat Ratio: 4 mg/g{creat} (ref 0–29)
Microalbumin, Urine: 4.6 ug/mL

## 2023-07-10 ENCOUNTER — Other Ambulatory Visit: Payer: Self-pay | Admitting: Family Medicine

## 2023-07-11 ENCOUNTER — Encounter: Payer: Self-pay | Admitting: Family Medicine

## 2023-07-14 DIAGNOSIS — Z87891 Personal history of nicotine dependence: Secondary | ICD-10-CM | POA: Diagnosis not present

## 2023-07-14 DIAGNOSIS — J3089 Other allergic rhinitis: Secondary | ICD-10-CM | POA: Diagnosis not present

## 2023-07-14 DIAGNOSIS — G4733 Obstructive sleep apnea (adult) (pediatric): Secondary | ICD-10-CM | POA: Diagnosis not present

## 2023-07-14 DIAGNOSIS — R918 Other nonspecific abnormal finding of lung field: Secondary | ICD-10-CM | POA: Diagnosis not present

## 2023-07-14 DIAGNOSIS — J441 Chronic obstructive pulmonary disease with (acute) exacerbation: Secondary | ICD-10-CM | POA: Diagnosis not present

## 2023-07-25 ENCOUNTER — Other Ambulatory Visit: Payer: Self-pay | Admitting: Family Medicine

## 2023-08-05 DIAGNOSIS — G4733 Obstructive sleep apnea (adult) (pediatric): Secondary | ICD-10-CM | POA: Diagnosis not present

## 2023-08-05 DIAGNOSIS — Z87891 Personal history of nicotine dependence: Secondary | ICD-10-CM | POA: Diagnosis not present

## 2023-08-05 DIAGNOSIS — J441 Chronic obstructive pulmonary disease with (acute) exacerbation: Secondary | ICD-10-CM | POA: Diagnosis not present

## 2023-08-05 DIAGNOSIS — J3089 Other allergic rhinitis: Secondary | ICD-10-CM | POA: Diagnosis not present

## 2023-08-05 DIAGNOSIS — R918 Other nonspecific abnormal finding of lung field: Secondary | ICD-10-CM | POA: Diagnosis not present

## 2023-08-09 ENCOUNTER — Other Ambulatory Visit: Payer: Self-pay | Admitting: Family Medicine

## 2023-08-09 DIAGNOSIS — I10 Essential (primary) hypertension: Secondary | ICD-10-CM

## 2023-08-17 ENCOUNTER — Ambulatory Visit: Payer: Medicare Other

## 2023-08-17 VITALS — Ht 73.0 in | Wt 200.0 lb

## 2023-08-17 DIAGNOSIS — Z0000 Encounter for general adult medical examination without abnormal findings: Secondary | ICD-10-CM

## 2023-08-17 NOTE — Patient Instructions (Signed)
  [name] , Thank you for taking time to come for your Medicare Wellness Visit. I appreciate your ongoing commitment to your health goals. Please review the following plan we discussed and let me know if I can assist you in the future.   These are the goals we discussed:  Goals       DIET - INCREASE WATER  INTAKE      Increase water  intake to 32 ounces daily. Start walking to get daily exercise.      Patient Stated (pt-stated)      08/04/2020 AWV Goal: Diabetes Management  Patient will maintain an A1C level below 7.5 Patient will not develop any diabetic foot complications Patient will not experience any hypoglycemic episodes over the next 3 months Patient will notify our office of any CBG readings outside of the provider recommended range by calling [phone] Patient will adhere to provider recommendations for diabetes management  Patient Self Management Activities take all medications as prescribed and report any negative side effects monitor and record blood sugar readings as directed adhere to a low carbohydrate diet that incorporates lean proteins, vegetables, whole grains, low glycemic fruits check feet daily noting any sores, cracks, injuries, or callous formations see PCP or podiatrist if he notices any changes in his legs, feet, or toenails Patient will visit PCP and have an A1C level checked every 3 to 6 months as directed  have a yearly eye exam to monitor for vascular changes associated with diabetes and will request that the report be sent to his pcp.  consult with his PCP regarding any changes in his health or new or worsening symptoms       Patient Stated (pt-stated)      Keep his A1C below 6.5. He would also like to loose a few lbs.      Patient Stated (pt-stated)      Patient stated that he would like to be move active.      Patient Stated      Patient states he would like to increase exercise.         This is a list of the screening recommended for  you and due dates:  Health Maintenance  Topic Date Due   COVID-19 Vaccine (7 - 2024-25 season) 11/21/2022   Flu Shot  10/21/2023   Eye exam for diabetics  12/02/2023   Screening for Lung Cancer  12/03/2023   Hemoglobin A1C  12/29/2023   Yearly kidney function blood test for diabetes  06/28/2024   Yearly kidney health urinalysis for diabetes  06/28/2024   Complete foot exam   06/28/2024   Medicare Annual Wellness Visit  08/16/2024   Colon Cancer Screening  05/11/2025   DTaP/Tdap/Td vaccine (3 - Td or Tdap) 02/18/2029   Pneumonia Vaccine  Completed   Hepatitis C Screening  Completed   Zoster (Shingles) Vaccine  Completed   HPV Vaccine  Aged Out   Meningitis B Vaccine  Aged Out

## 2023-08-17 NOTE — Progress Notes (Signed)
 Subjective:   Frederick Guzman is a 73 y.o. male who presents for Medicare Annual/Subsequent preventive examination.  Visit Complete: Virtual I connected with  Frederick Guzman on 08/17/23 by a audio enabled telemedicine application and verified that I am speaking with the correct person using two identifiers.  Patient Location: Home  Provider Location: Office/Clinic  I discussed the limitations of evaluation and management by telemedicine. The patient expressed understanding and agreed to proceed.  Vital Signs: Because this visit was a virtual/telehealth visit, some criteria may be missing or patient reported. Any vitals not documented were not able to be obtained and vitals that have been documented are patient reported.  Patient Medicare AWV questionnaire was completed by the patient on 08/11/2023; I have confirmed that all information answered by patient is correct and no changes since this date.  Cardiac Risk Factors include: advanced age (>47men, >60 women);smoking/ tobacco exposure;male gender;dyslipidemia;hypertension;diabetes mellitus;family history of premature cardiovascular disease     Objective:    Today's Vitals   08/17/23 1055  Weight: 200 lb (90.7 kg)  Height: 6\' 1"  (1.854 m)   Body mass index is 26.39 kg/m.     08/17/2023   11:06 AM 08/11/2022    8:30 AM 09/04/2021    2:21 AM 09/03/2021    7:40 PM 08/31/2021    9:45 AM 08/05/2021    9:08 AM 12/30/2020    8:45 AM  Advanced Directives  Does Patient Have a Medical Advance Directive? Yes Yes  No No Yes Yes  Type of Estate agent of [place];Living will Living will    Healthcare Power of [place];Living will Living will;Healthcare Power of Attorney  Does patient want to make changes to medical advance directive? No - Patient declined No - Patient declined    No - Patient declined   Copy of Healthcare Power of Attorney in Chart? Yes - validated most recent copy scanned in chart (See row  information)     No - copy requested No - copy requested  Would patient like information on creating a medical advance directive?   No - Patient declined  No - Patient declined  No - Patient declined    Current Medications (verified) Outpatient Encounter Medications as of 08/17/2023  Medication Sig   albuterol  (PROVENTIL ) (2.5 MG/3ML) 0.083% nebulizer solution Take 2.5 mg by nebulization every 6 (six) hours as needed for wheezing or shortness of breath.   albuterol  (VENTOLIN  HFA) 108 (90 Base) MCG/ACT inhaler Inhale 2 puffs into the lungs every 6 (six) hours as needed for wheezing or shortness of breath.   atorvastatin  (LIPITOR) 40 MG tablet Take 1 tablet (40 mg total) by mouth every other day.   b complex vitamins tablet Take 1 tablet by mouth daily.   Budeson-Glycopyrrol-Formoterol  (BREZTRI AEROSPHERE) 160-9-4.8 MCG/ACT AERO Inhale 1 puff into the lungs daily.   buPROPion  (WELLBUTRIN  XL) 150 MG 24 hr tablet Take 1 tablet (150 mg total) by mouth daily.   cetirizine (ZYRTEC) 10 MG tablet Take 10 mg by mouth daily as needed for allergies.    ciclopirox  (PENLAC ) 8 % solution Apply topically at bedtime. Apply over nail & surrounding skin. Apply daily over previous coat. After 7 days, may remove with alcohol & continue cycle.   Continuous Glucose Receiver (FREESTYLE LIBRE 3 READER) DEVI Use to monitor glucose continuously.   esomeprazole  (NEXIUM ) 40 MG capsule TAKE ONE CAPSULE BY MOUTH EVERY DAY   fluticasone  (FLONASE ) 50 MCG/ACT nasal spray Place 1 spray into both nostrils daily  as needed for allergies.   insulin  degludec (TRESIBA  FLEXTOUCH) 100 UNIT/ML FlexTouch Pen Inject 20 Units into the skin 2 (two) times daily. Do not bill insurance-please bill to manufacture provided free 30-day voucher: [phone], PCN-CNRX, GRP--[id number], [id number]   lisinopril  (ZESTRIL ) 2.5 MG tablet Take 1 tablet (2.5 mg total) by mouth at bedtime.   meloxicam  (MOBIC ) 15 MG tablet Take 1 tablet (15 mg total) by  mouth daily.   Multiple Vitamin (MULTIVITAMIN WITH MINERALS) TABS tablet Take 1 tablet by mouth at bedtime.   NOVOFINE PEN NEEDLE 32G X 6 MM MISC Used to inject Tresiba  twice daily.Do not bill insurance-please bill to manufacture provided free 30-day voucher: [phone], Reather Campbell, [phone]   Omega-3 Fatty Acids (FISH OIL) 1000 MG CAPS Take 1,000 mg by mouth at bedtime.    Polyvinyl Alcohol-Povidone (REFRESH OP) Place 1 drop into both eyes daily as needed (dry eyes).   Semaglutide , 1 MG/DOSE, (OZEMPIC , 1 MG/DOSE,) 4 MG/3ML SOPN Inject 1 mg into the skin once a week. Do not bill insurance- bill to [employer] voucher: [phone] PCN-CNRX [phone] [phone]   tacrolimus  (PROTOPIC ) 0.1 % ointment Apply 1 application topically daily.   tadalafil  (CIALIS ) 20 MG tablet TAKE ONE TABLET BY MOUTH DAILY AS NEEDED FOR ERECTILE DYSFUNCTION   tamsulosin  (FLOMAX ) 0.4 MG CAPS capsule Take 1 capsule (0.4 mg total) by mouth at bedtime.   Testosterone  30 MG/ACT SOLN APPLY 1-2 PUMPS DAILY. ALTERNATE TAKING ONE PUMP UNDER ONE ARM ONE DAY, TWO PUMPS THE NEXT DAY (ONE PUMP UNDER EACH ARM)   torsemide  (DEMADEX ) 100 MG tablet Take 1 tablet (100 mg total) by mouth daily.   vitamin C  (ASCORBIC ACID ) 500 MG tablet Take 500 mg by mouth daily.   nitroGLYCERIN  (NITROSTAT ) 0.4 MG SL tablet Place 1 tablet (0.4 mg total) under the tongue every 5 (five) minutes as needed for chest pain. (Patient not taking: Reported on 08/11/2022)   [DISCONTINUED] predniSONE  (DELTASONE ) 10 MG tablet Take 10 mg by mouth.   [DISCONTINUED] tadalafil  (CIALIS ) 20 MG tablet TAKE 1/2 TO 1 TABLET BY MOUTH EVERY OTHER DAY AS NEEDED FOR ERECTILE DYSFUNCTION   No facility-administered encounter medications on file as of 08/17/2023.    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   Arthritis    OA   Arthritis of wrist 05/28/2011   Biliary obstruction 09/04/2021   BPH (benign prostatic hyperplasia) 06/17/2011   BRONCHITIS, CHRONIC 03/10/2007   Qualifier: Diagnosis of   By: Matilde Son MD, Vineet       Cancer [facility]) 08/2014   melanoma on R shoulder & back - [facility]    Combined forms of age-related cataract of right eye 09/21/2018   COPD (chronic obstructive pulmonary disease) (HCC)    COPD with exacerbation (HCC) 04/13/2021   Diverticulitis    Dyspnea    with exertion   Elevated liver enzymes 09/03/2021   Generalized abdominal pain    GERD (gastroesophageal reflux disease)    HTN (hypertension)    Hyperlipemia    Hyperlipidemia 08/07/2020   Hypogonadism male 06/17/2011   Long term use of drug 04/19/2017   Lymphocytic colitis 04/2018   MDD (major depressive disorder) (HCC) 04/30/2011   OBSTRUCTIVE SLEEP APNEA 03/10/2007   Qualifier: Diagnosis of   By: Matilde Son MD, Vineet       Pain in joint of left shoulder 04/19/2017  Pain in right hand 08/04/2017   Pneumonia 06/2015, 10/2016   Positive depression screening 09/21/2016   Pulmonary nodules 10/23/2017   S/P laparoscopic cholecystectomy 09/03/2021   S/P shoulder replacement, left 01/30/2016   Status post shoulder surgery 08/09/2016   TOBACCO ABUSE 03/10/2007   Qualifier: Diagnosis of   By: Matilde Son MD, Vineet       Transaminitis 09/03/2021   Uncontrolled type 2 diabetes mellitus with hyperglycemia, without long-term current use of insulin  (HCC) 09/21/2016   Likely will need dual or triple therapy but will start with metformin  alone in case of intolerance. Close follow-up with home blood sugar readings. ?steroid rol   Past Surgical History:  Procedure Laterality Date   carpel tunnel Bilateral    CHOLECYSTECTOMY N/A 08/31/2021   Procedure: LAPAROSCOPIC CHOLECYSTECTOMY AND UMBILICAL HERNIA REPAIR;  Surgeon: Dareen Ebbing, MD;  Location: [facility] ORS;  Service: General;  Laterality: N/A;   COLONOSCOPY W/  POLYPECTOMY     COLONOSCOPY W/ POLYPECTOMY     EXCISIONAL TOTAL SHOULDER ARTHROPLASTY WITH ANTIBIOTIC SPACER Left 08/09/2016   Procedure: LEFT SHOULDER HARDWARE REMOVAL, IRRIGATION AND DEBRIDEMENT, PLACEMENT OF ANTIBIOTIC SPACER;  Surgeon: Winston Hawking, MD;  Location: [facility] OR;  Service: Orthopedics;  Laterality: Left;   IRRIGATION AND DEBRIDEMENT SHOULDER Left 03/31/2016   Procedure: IRRIGATION AND DEBRIDEMENT SHOULDER, deep cultures and polyethylene exchange;  Surgeon: Winston Hawking, MD;  Location[facility];  Service: Orthopedics;  Laterality: Left;  requests   REVERSE SHOULDER ARTHROPLASTY Left 01/30/2016   Procedure: LEFT REVERSE SHOULDER ARTHROPLASTY;  Surgeon: Winston Hawking, MD;  [facility];  Service: Orthopedics;  Laterality: Left;   ROTATOR CUFF REPAIR Left    SHOULDER ARTHROSCOPY WITH DISTAL CLAVICLE RESECTION Right 01/30/2018   Procedure: RIGHT SHOULDER ARTHROSCOPY WITH OPEN DISTAL CLAVICLE RESECTION, SUBACROMIAL DECOMPRESSION;  Surgeon: Winston Hawking, MD;  Location: [facility];  Service: Orthopedics;  Laterality: Right;   STERIOD INJECTION Right 01/30/2018   Procedure: STEROID INJECTION;  Surgeon: Winston Hawking, MD;  Location: [facility] OR;  Service: Orthopedics;  Laterality: Right;   TOTAL SHOULDER REVISION Left 01/17/2017   Procedure: LEFT SHOULDER REVISION REVERSE TOTAL SHOULDER ARTHROPLASTY;  Surgeon: Winston Hawking, MD;  Location: [facility] OR;  Service: Orthopedics;  Laterality: Left;   UMBILICAL HERNIA REPAIR N/A 08/31/2021   Procedure: HERNIA REPAIR UMBILICAL ADULT possible;  Surgeon: Dareen Ebbing, MD;  Location: [facility];  Service: General;  Laterality: N/A;   WRIST SURGERY Right 11/2014   Dr. Aloha Arnold   Family History  Problem Relation Age of Onset   Diabetes Mother    Colon cancer Father    Prostate cancer Father    Diabetes Father    Esophageal cancer Neg Hx    Rectal cancer Neg Hx    Stomach cancer Neg Hx    Social History   Socioeconomic History   Marital status: Domestic  Partner    Spouse name: Rexene Catching   Number of children: 2   Years of education: 12   Highest education level: 12th grade  Occupational History   Occupation: lineman    Comment: retired  Tobacco Use   Smoking status: Former    Current packs/day: 0.00    Average packs/day: 0.8 packs/day for 43.0 years (32.3 ttl pk-yrs)    Types: Cigarettes    Start date: 03/1979    Quit date: 03/2022    Years since quitting: 1.4   Smokeless tobacco: Never   Tobacco comments:    pt no longer taking chantix   Vaping Use   Vaping status: Never  Used  Substance and Sexual Activity   Alcohol use: Yes    Alcohol/week: 9.0 standard drinks of alcohol    Types: 6 Cans of beer, 3 Shots of liquor per week    Comment: beer or mixed drink    Drug use: No   Sexual activity: Yes  Other Topics Concern   Not on file  Social History Narrative   Lives with his girlfriend, Rexene Catching. Retired. DOes a lot of yard work, travelling in the RV and handy work. Members of the camping club.   Social Drivers of Corporate investment banker Strain: Low Risk  (08/17/2023)   Overall Financial Resource Strain (CARDIA)    Difficulty of Paying Living Expenses: Not hard at all  Recent Concern: Financial Resource Strain - Medium Risk (06/25/2023)   Overall Financial Resource Strain (CARDIA)    Difficulty of Paying Living Expenses: Somewhat hard  Food Insecurity: No Food Insecurity (08/17/2023)   Hunger Vital Sign    Worried About Running Out of Food in the Last Year: Never true    Ran Out of Food in the Last Year: Never true  Recent Concern: Food Insecurity - Food Insecurity Present (06/25/2023)   Hunger Vital Sign    Worried About Running Out of Food in the Last Year: Sometimes true    Ran Out of Food in the Last Year: Never true  Transportation Needs: No Transportation Needs (08/17/2023)   PRAPARE - Administrator, Civil Service (Medical): No    Lack of Transportation (Non-Medical): No  Physical Activity: Sufficiently Active  (08/17/2023)   Exercise Vital Sign    Days of Exercise per Week: 3 days    Minutes of Exercise per Session: 60 min  Stress: Stress Concern Present (08/17/2023)   [employer] of Occupational Health - Occupational Stress Questionnaire    Feeling of Stress : To some extent  Social Connections: Socially Integrated (08/17/2023)   Social Connection and Isolation Panel [NHANES]    Frequency of Communication with Friends and Family: More than three times a week    Frequency of Social Gatherings with Friends and Family: More than three times a week    Attends Religious Services: More than 4 times per year    Active Member of [employer] or Organizations: Yes    Attends Engineer, structural: More than 4 times per year    Marital Status: Living with partner    Tobacco Counseling Counseling given: Not Answered Tobacco comments: pt no longer taking chantix    Clinical Intake:  Pre-visit preparation completed: Yes  Pain : No/denies pain     BMI - recorded: 26.39 Nutritional Status: BMI 25 -29 Overweight Nutritional Risks: None Diabetes: Yes CBG done?: Yes (122) CBG resulted in Enter/ Edit results?: No Did pt. bring in CBG monitor from home?: No  How often do you need to have someone help you when you read instructions, pamphlets, or other written materials from your doctor or pharmacy?: 1 - Never What is the last grade level you completed in school?: 12  Interpreter Needed?: No      Activities of Daily Living    08/17/2023   10:57 AM 08/13/2023    9:00 AM  In your present state of health, do you have any difficulty performing the following activities:  Hearing? 0 0  Vision? 0 0  Difficulty concentrating or making decisions? 0 0  Walking or climbing stairs? 0 0  Dressing or bathing? 0 0  Doing errands, shopping? 0 0  Preparing Food and eating ? N N  Using the Toilet? N N  In the past six months, have you accidently leaked urine? N N  Do you have problems with loss of  bowel control? N N  Managing your Medications? N N  Managing your Finances? N N  Housekeeping or managing your Housekeeping? N N    Patient Care Team: Adela Holter, DO as PCP - General (Family Medicine) Linn Rich, [facility] (Pharmacist) Hershell Lose, OD (Optometry) Donnice Gale, MD as Referring Physician (Pulmonary Disease) Sikora, Rebecca, DPM as Consulting Physician (Podiatry) Gaynelle Keeling, MD as Consulting Physician (Dermatology)  Indicate any recent Medical Services you may have received from other than [facility] providers in the past year (date may be approximate).     Assessment:   This is a routine wellness examination for Frederick Guzman.  Hearing/Vision screen No results found.   Goals Addressed             This Visit's Progress    Patient Stated       Patient states he would like to increase exercise.       Depression Screen    08/17/2023   11:04 AM 06/29/2023    9:02 AM 12/01/2022   10:28 AM 08/12/2022    8:42 AM 08/11/2022    8:30 AM 04/13/2022    9:03 AM 12/08/2021    9:20 AM  PHQ 2/9 Scores  PHQ - 2 Score 2 4 0 6 1 6  0  PHQ- 9 Score  16 5 17  16 1     Fall Risk    08/17/2023   11:07 AM 08/13/2023    9:00 AM 06/29/2023   11:22 AM 12/01/2022    9:55 AM 08/11/2022    8:30 AM  Fall Risk   Falls in the past year? 0 0 0 0 0  Number falls in past yr: 0 0 0 0 0  Injury with Fall? 0 0 0 0 0  Risk for fall due to : No Fall Risks  No Fall Risks No Fall Risks No Fall Risks  Follow up Falls evaluation completed  Falls evaluation completed Falls evaluation completed Falls evaluation completed    MEDICARE RISK AT HOME: Medicare Risk at Home Any stairs in or around the home?: Yes If so, are there any without handrails?: No Home free of loose throw rugs in walkways, pet beds, electrical cords, etc?: Yes Adequate lighting in your home to reduce risk of falls?: Yes Life alert?: No Use of a cane, walker or w/c?: No Grab bars in the bathroom?: No Shower chair or bench  in shower?: No Elevated toilet seat or a handicapped toilet?: No  TIMED UP AND GO:  Was the test performed?  No    Cognitive Function:        08/17/2023   11:07 AM 08/11/2022    8:37 AM 08/05/2021    9:14 AM 08/04/2020    1:49 PM 06/26/2018   11:32 AM  6CIT Screen  What Year? 0 points 0 points 0 points 0 points 0 points  What month? 0 points 0 points 0 points 0 points 0 points  What time? 0 points 0 points 0 points 0 points 0 points  Count back from 20 0 points 4 points 0 points 0 points 0 points  Months in reverse 0 points 0 points 0 points 0 points 0 points  Repeat phrase 0 points 0 points 0 points 0 points 0 points  Total Score 0 points 4 points  0 points 0 points 0 points    Immunizations Immunization History  Administered Date(s) Administered   Fluad Quad(high Dose 65+) 11/14/2018, 12/20/2021   Fluad Trivalent(High Dose 65+) 12/01/2022   Influenza Split 02/14/2012, 12/27/2012   Influenza, High Dose Seasonal PF 12/15/2016, 01/19/2018   Influenza, Seasonal, Injecte, Preservative Fre 11/27/2015   Influenza-Unspecified 02/14/2012, 12/27/2012, 12/19/2019   PFIZER(Purple Top)SARS-COV-2 Vaccination 05/13/2019, 09/04/2019, 01/02/2020, 01/02/2020, 01/15/2021   Pfizer Covid-19 Vaccine Bivalent Booster 15yrs & up 09/16/2021   Pneumococcal Conjugate-13 03/18/2015   Pneumococcal Polysaccharide-23 01/19/2008, 12/15/2016   Tdap 05/31/2011, 02/19/2019   Zoster Recombinant(Shingrix) 02/19/2019, 03/20/2019    TDAP status: Up to date  Flu Vaccine status: Up to date  Pneumococcal vaccine status: Up to date  Covid-19 vaccine status: Completed vaccines  Qualifies for Shingles Vaccine? Yes   Zostavax completed No   Shingrix Completed?: Yes  Screening Tests Health Maintenance  Topic Date Due   COVID-19 Vaccine (7 - 2024-25 season) 11/21/2022   INFLUENZA VACCINE  10/21/2023   OPHTHALMOLOGY EXAM  12/02/2023   Lung Cancer Screening  12/03/2023   HEMOGLOBIN A1C  12/29/2023    Diabetic kidney evaluation - eGFR measurement  06/28/2024   Diabetic kidney evaluation - Urine ACR  06/28/2024   FOOT EXAM  06/28/2024   Medicare Annual Wellness (AWV)  08/16/2024   Colonoscopy  05/11/2025   DTaP/Tdap/Td (3 - Td or Tdap) 02/18/2029   Pneumonia Vaccine 44+ Years old  Completed   Hepatitis C Screening  Completed   Zoster Vaccines- Shingrix  Completed   HPV VACCINES  Aged Out   Meningococcal B Vaccine  Aged Out    Health Maintenance  Health Maintenance Due  Topic Date Due   COVID-19 Vaccine (7 - 2024-25 season) 11/21/2022    Colorectal cancer screening: Type of screening: Colonoscopy. Completed 05/11/2018. Repeat every 7 years  Lung Cancer Screening: (Low Dose CT Chest recommended if Age 19-80 years, 20 pack-year currently smoking OR have quit w/in 15years.) does qualify.   Lung Cancer Screening Referral: patient already in the program  Additional Screening:  Hepatitis C Screening: does qualify; Completed 09/04/2021  Vision Screening: Recommended annual ophthalmology exams for early detection of glaucoma and other disorders of the eye. Is the patient up to date with their annual eye exam?  Yes  Who is the provider or what is the name of the office in which the patient attends annual eye exams? Dr Alethia Huxley If pt is not established with a provider, would they like to be referred to a provider to establish care? N/a.   Dental Screening: Recommended annual dental exams for proper oral hygiene  Diabetic Foot Exam: Diabetic Foot Exam: Completed 06/29/2023  Community Resource Referral / Chronic Care Management: CRR required this visit?  No   CCM required this visit?  No     Plan:     I have personally reviewed and noted the following in the patient's chart:   Medical and social history Use of alcohol, tobacco or illicit drugs  Current medications and supplements including opioid prescriptions. Patient is not currently taking opioid prescriptions. Functional ability  and status Nutritional status Physical activity Advanced directives List of other physicians Hospitalizations, surgeries, and ER visits in previous 12 months. None Vitals Screenings to include cognitive, depression, and falls Referrals and appointments  In addition, I have reviewed and discussed with patient certain preventive protocols, quality metrics, and best practice recommendations. A written personalized care plan for preventive services as well as general preventive health recommendations were provided  to patient.     Aubrey Leaf, CMA   08/17/2023   After Visit Summary: (MyChart) Due to this being a telephonic visit, the after visit summary with patients personalized plan was offered to patient via MyChart   Nurse Notes:   Frederick Guzman is a 73 y.o. male patient of Adela Holter, DO who had a Medicare Annual Wellness Visit today via telephone. Frederick Guzman is Retired and lives with their partner. He has 2 children. he reports that he is socially active and does interact with friends/family regularly. He is moderately physically active and enjoys yard work, handy work and traveling in the RV.

## 2023-08-25 ENCOUNTER — Other Ambulatory Visit: Payer: Self-pay | Admitting: Family Medicine

## 2023-08-25 DIAGNOSIS — E291 Testicular hypofunction: Secondary | ICD-10-CM

## 2023-08-25 NOTE — Telephone Encounter (Signed)
 Last OV: 06/29/23 Last fill: 03/17/23 @  Dispense Quantity: 90 mL Refills: 0        Sig: APPLY 1-2 PUMPS DAILY. ALTERNATE TAKING ONE PUMP UNDER ONE ARM ONE DAY, TWO PUMPS THE NEXT DAY (ONE PUMP UNDER EACH ARM)

## 2023-08-29 ENCOUNTER — Telehealth: Payer: Self-pay

## 2023-08-29 NOTE — Progress Notes (Signed)
   08/29/2023  Patient ID: Frederick Guzman, male   DOB: 31-Jul-1950, 73 y.o.   MRN: [id number]  Missed call/voicemail from patient due to concern with recently increased copay for Testosterone  .  Contacted [employer], and medication was initially billed to a discount card, not the patient's insurance.  Pharmacy billed to patient's insurance, and medication is coming back non-formulary; but they do state this is the same [facility] patient has been receiving.  PA for medication was approved December of 2024, and approval states this was good through 03/21/2024.  However, test claims reflect all generic NDC's for this specific product are now non-formulary.  Submitted a PA request via CoverMyMeds and notified patient.  Linn Rich, PharmD, DPLA

## 2023-08-30 ENCOUNTER — Telehealth: Payer: Self-pay

## 2023-08-30 NOTE — Telephone Encounter (Signed)
 Copied from CRM [id number]. Topic: Clinical - Medication Prior Auth >> Aug 30, 2023  9:38 AM Danelle Dunning F wrote: Reason for CRM:    What is the name of the person calling?   Velia Gess   Where are you calling from?  [employer]   Calling to acknowledge a approval on a medication request.  Please be on the lookout for the approval letter that will be sent via fax.  Medication In question:  Testosterone  30 MG/ACT SOLN  Contact information:  Phone: [phone] Option 5 Fax: [phone]

## 2023-08-30 NOTE — Telephone Encounter (Signed)
Patient informed of medication approval.

## 2023-08-31 ENCOUNTER — Telehealth: Payer: Self-pay

## 2023-08-31 NOTE — Progress Notes (Signed)
   08/31/2023  Patient ID: Frederick Guzman, male   DOB: [birth year], 73 y.o.   MRN: [id number]  Prior authorization for patient's testosterone  was approved by the insurance company, so I contacted Mr. Swing to see if he was able to pick the medication up.  He is going to [employer] today to have them reprocess the prescription on insurance and will let me know if there are any additional issues with getting the medication.  Linn Rich, PharmD, DPLA

## 2023-09-01 ENCOUNTER — Telehealth: Payer: Self-pay

## 2023-09-01 NOTE — Telephone Encounter (Signed)
 Forwarding to [place] as an Financial planner.  [name]  [employer] PAP shipment for Tresiba100u dose / 4 boxes received this morning. Please contact the patient to come and pick up their order today. Placed in the PAP fridge with patient identifier. Thanks in advance.   NDC: [id number] LOT: [id number] EXP: 2025-09-18

## 2023-09-01 NOTE — Telephone Encounter (Signed)
 Pt has been advised. States he'll come by in the next day or two.

## 2023-09-07 DIAGNOSIS — L820 Inflamed seborrheic keratosis: Secondary | ICD-10-CM | POA: Diagnosis not present

## 2023-09-07 DIAGNOSIS — L738 Other specified follicular disorders: Secondary | ICD-10-CM | POA: Diagnosis not present

## 2023-09-07 DIAGNOSIS — D225 Melanocytic nevi of trunk: Secondary | ICD-10-CM | POA: Diagnosis not present

## 2023-09-07 DIAGNOSIS — Z85828 Personal history of other malignant neoplasm of skin: Secondary | ICD-10-CM | POA: Diagnosis not present

## 2023-09-07 DIAGNOSIS — D1801 Hemangioma of skin and subcutaneous tissue: Secondary | ICD-10-CM | POA: Diagnosis not present

## 2023-09-07 DIAGNOSIS — L570 Actinic keratosis: Secondary | ICD-10-CM | POA: Diagnosis not present

## 2023-09-07 DIAGNOSIS — L821 Other seborrheic keratosis: Secondary | ICD-10-CM | POA: Diagnosis not present

## 2023-09-07 DIAGNOSIS — L814 Other melanin hyperpigmentation: Secondary | ICD-10-CM | POA: Diagnosis not present

## 2023-09-07 DIAGNOSIS — Z85820 Personal history of malignant melanoma of skin: Secondary | ICD-10-CM | POA: Diagnosis not present

## 2023-09-07 DIAGNOSIS — D224 Melanocytic nevi of scalp and neck: Secondary | ICD-10-CM | POA: Diagnosis not present

## 2023-09-07 DIAGNOSIS — D692 Other nonthrombocytopenic purpura: Secondary | ICD-10-CM | POA: Diagnosis not present

## 2023-09-08 ENCOUNTER — Other Ambulatory Visit: Payer: Self-pay | Admitting: Family Medicine

## 2023-09-13 DIAGNOSIS — N401 Enlarged prostate with lower urinary tract symptoms: Secondary | ICD-10-CM | POA: Diagnosis not present

## 2023-09-13 DIAGNOSIS — R3912 Poor urinary stream: Secondary | ICD-10-CM | POA: Diagnosis not present

## 2023-09-16 ENCOUNTER — Ambulatory Visit (INDEPENDENT_AMBULATORY_CARE_PROVIDER_SITE_OTHER): Admitting: Podiatry

## 2023-09-16 ENCOUNTER — Encounter: Payer: Self-pay | Admitting: Podiatry

## 2023-09-16 DIAGNOSIS — E119 Type 2 diabetes mellitus without complications: Secondary | ICD-10-CM

## 2023-09-16 DIAGNOSIS — M79675 Pain in left toe(s): Secondary | ICD-10-CM

## 2023-09-16 DIAGNOSIS — M79674 Pain in right toe(s): Secondary | ICD-10-CM | POA: Diagnosis not present

## 2023-09-16 DIAGNOSIS — B351 Tinea unguium: Secondary | ICD-10-CM

## 2023-09-16 NOTE — Progress Notes (Signed)
 Subjective:  Patient ID: Frederick Guzman, male    DOB: [birth year],   MRN: [id number]  Chief Complaint  Patient presents with   Diabetes    Trim my toenails.  Saw Dr. Alvia -    : A1c - 5.9    73 y.o. male presents for multiple concerns including concern of thickened elongated and painful nails that are difficult to trim. Requesting to have them trimmed today. Denies burning and tingling in their feet. Patient is diabetic and last A1c was  Lab Results  Component Value Date   HGBA1C 6.2 06/29/2023   .  PCP:  Alvia Bring, DO    . Denies any other pedal complaints. Denies n/v/f/c.   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   Arthritis    OA   Arthritis of wrist 05/28/2011   Biliary obstruction 09/04/2021   BPH (benign prostatic hyperplasia) 06/17/2011   BRONCHITIS, CHRONIC 03/10/2007   Qualifier: Diagnosis of   By: Shellia MD, Vineet       Cancer [facility]) 08/2014   melanoma on R shoulder & back - [facility]    Combined forms of age-related cataract of right eye 09/21/2018   COPD (chronic obstructive pulmonary disease) (HCC)    COPD with exacerbation (HCC) 04/13/2021   Diverticulitis    Dyspnea    with exertion   Elevated liver enzymes 09/03/2021   Generalized abdominal pain    GERD (gastroesophageal reflux disease)    HTN (hypertension)    Hyperlipemia    Hyperlipidemia 08/07/2020   Hypogonadism male 06/17/2011   Long term use of drug 04/19/2017   Lymphocytic colitis 04/2018   MDD (major depressive disorder) (HCC) 04/30/2011   OBSTRUCTIVE SLEEP APNEA 03/10/2007   Qualifier: Diagnosis of   By: Shellia MD, Vineet       Pain in joint of left shoulder 04/19/2017   Pain in right hand 08/04/2017   Pneumonia 06/2015, 10/2016   Positive depression screening 09/21/2016   Pulmonary nodules 10/23/2017   S/P laparoscopic cholecystectomy 09/03/2021   S/P shoulder replacement, left  01/30/2016   Status post shoulder surgery 08/09/2016   TOBACCO ABUSE 03/10/2007   Qualifier: Diagnosis of   By: Shellia MD, Vineet       Transaminitis 09/03/2021   Uncontrolled type 2 diabetes mellitus with hyperglycemia, without long-term current use of insulin  (HCC) 09/21/2016   Likely will need dual or triple therapy but will start with metformin  alone in case of intolerance. Close follow-up with home blood sugar readings. ?steroid rol    Objective:  Physical Exam: Vascular: DP/PT pulses 2/4 bilateral. CFT <3 seconds. Normal hair growth on digits. No edema.  Skin. No lacerations or abrasions bilateral feet. Nails 1-5 bilateral thickened and elongated with subungual debris.  Musculoskeletal: MMT 5/5 bilateral lower extremities in DF, PF, Inversion and Eversion. Deceased ROM in DF of ankle joint. Tender to the medial calcaneal tubercle left . No pain with achilles, PT or arch. No pain with calcaneal squeeze.  Neurological: Sensation intact to light touch.   Assessment:   1. Pain due to onychomycosis of toenails of both feet   2. Type 2 diabetes mellitus without complication, without long-term current use of insulin  (HCC)        Plan:  Patient was evaluated and treated and all questions answered. -Discussed and educated patient on diabetic foot care, especially with  regards to the vascular, neurological and musculoskeletal  systems.  -Stressed the importance of good glycemic control and the detriment of not  controlling glucose levels in relation to the foot. -Discussed supportive shoes at all times and checking feet regularly.  -Mechanically debrided all nails 1-5 bilateral using sterile nail nipper and filed with dremel without incident  Follow-up 3months for rfc.    Asberry Failing, DPM

## 2023-09-19 ENCOUNTER — Telehealth: Payer: Self-pay

## 2023-09-19 NOTE — Progress Notes (Signed)
   09/19/2023  Patient ID: [name], male   DOB: 03-Dec-1950, 73 y.o.   MRN: [id number]  Ozempic  from [employer] PAP shipped today and should arrive at [facility] tomorrow.  Attempted to call patient to make him aware, but was not able to reach him  HIPAA compliant voicemail left with my direct phone number.  [name], PharmD, DPLA

## 2023-09-19 NOTE — Progress Notes (Signed)
   09/19/2023  Patient ID: Frederick Guzman, male   DOB: 1950-04-04, 73 y.o.   MRN: [id number]  Missed call/voicemail from patient stating Tresiba  was recently received through [employer] PAP, but he did not receive Ozempic  and is about to go out of town next week and will be gone for 2 months.  Coordinating with the medication assistance team to check on the next fill of patient's Ozempic  1mg .  [name], PharmD, DPLA

## 2023-09-20 ENCOUNTER — Telehealth: Payer: Self-pay

## 2023-09-20 NOTE — Telephone Encounter (Addendum)
 Forwarding to Frederick Guzman as an Financial planner.  [name]  [employer] PAP shipment for Ozempic  1 mg dose / 4 boxes and NovoFine 32 g tip Needles / 5 boxes received this morning. Please contact the patient to come and pick up their order today. Placed in the PAP fridge with patient identifier. Thanks in advance.   NDC: 9830-5869-86 LOT: [id number] EXP: 2026-01-19  LIST: [id number] LOT: [id number] EXP:2028-01-20

## 2023-10-14 ENCOUNTER — Other Ambulatory Visit: Payer: Self-pay

## 2023-10-14 ENCOUNTER — Telehealth: Payer: Self-pay

## 2023-10-14 DIAGNOSIS — E1165 Type 2 diabetes mellitus with hyperglycemia: Secondary | ICD-10-CM

## 2023-10-14 NOTE — Telephone Encounter (Signed)
 Deanna Channing LABOR, RPH  Alvia Bring, DO; [place], [place] P, LPN Good morning!  I had a missed call/voicemail from Mr. [place] stating [facility] [employer] is in need of an updated order for [facility] 3+ sensors since [place] 3 sensors are no longer available.  Per previous chart notes, their contact information is fax # [phone] & phone # [phone].  Can you assist with this?  Thank you!  Channing

## 2023-10-14 NOTE — Progress Notes (Signed)
   10/14/2023  Patient ID: [name], male   DOB: 1950-06-07, 73 y.o.   MRN: [id number]  Missed call/voicemail from Mr. Mcloud stating [facility] [employer] is in need of an updated order for [facility] 3+ sensors since [place] 3 sensors are no longer available.  Per previous chart notes, their contact information is fax # [phone] & phone # [phone].  Routing to PCP and CMA to assist.  [name], PharmD, DPLA

## 2023-10-14 NOTE — Telephone Encounter (Signed)
 Channing, forwarding request to Dr. Alvia for review next week   Dr. Alvia,  Patient needing to change from [facility] 3 to [place] 3 plus Pended script  Last written as herlene 3 on 12/16/2022 for reader - do not see sensors in current med list  Last OV 07/19/2023 Upcoming appt 12/30/2023 O.k. to send refills for full year ? Is #6 a 90 day supply ? Will need to print prescription to send to [facility] for pateint.

## 2023-10-19 MED ORDER — FREESTYLE LIBRE 3 PLUS SENSOR MISC: 0 refills | Status: DC

## 2023-10-21 ENCOUNTER — Other Ambulatory Visit (HOSPITAL_COMMUNITY): Payer: Self-pay

## 2023-10-21 ENCOUNTER — Other Ambulatory Visit: Payer: Self-pay

## 2023-10-21 DIAGNOSIS — E1165 Type 2 diabetes mellitus with hyperglycemia: Secondary | ICD-10-CM

## 2023-10-21 MED ORDER — FREESTYLE LIBRE 3 PLUS SENSOR MISC
0 refills | Status: AC
Start: 2023-10-21 — End: ?

## 2023-11-10 NOTE — Telephone Encounter (Signed)
 Spoke with representative - they did receive a fax prescription form and OV notes but she states it is required to be completed on their form  She will refax this form today to the office.

## 2023-11-22 ENCOUNTER — Encounter: Payer: Self-pay | Admitting: Sports Medicine

## 2023-12-06 DIAGNOSIS — H40013 Open angle with borderline findings, low risk, bilateral: Secondary | ICD-10-CM | POA: Diagnosis not present

## 2023-12-06 DIAGNOSIS — E119 Type 2 diabetes mellitus without complications: Secondary | ICD-10-CM | POA: Diagnosis not present

## 2023-12-06 DIAGNOSIS — Z961 Presence of intraocular lens: Secondary | ICD-10-CM | POA: Diagnosis not present

## 2023-12-06 DIAGNOSIS — H35373 Puckering of macula, bilateral: Secondary | ICD-10-CM | POA: Diagnosis not present

## 2023-12-06 DIAGNOSIS — H31092 Other chorioretinal scars, left eye: Secondary | ICD-10-CM | POA: Diagnosis not present

## 2023-12-08 ENCOUNTER — Other Ambulatory Visit: Payer: Self-pay | Admitting: Family Medicine

## 2023-12-16 ENCOUNTER — Ambulatory Visit (INDEPENDENT_AMBULATORY_CARE_PROVIDER_SITE_OTHER): Admitting: Podiatry

## 2023-12-16 ENCOUNTER — Other Ambulatory Visit: Payer: Self-pay | Admitting: Family Medicine

## 2023-12-16 ENCOUNTER — Encounter: Payer: Self-pay | Admitting: Podiatry

## 2023-12-16 DIAGNOSIS — M79675 Pain in left toe(s): Secondary | ICD-10-CM

## 2023-12-16 DIAGNOSIS — B351 Tinea unguium: Secondary | ICD-10-CM | POA: Diagnosis not present

## 2023-12-16 DIAGNOSIS — E119 Type 2 diabetes mellitus without complications: Secondary | ICD-10-CM | POA: Diagnosis not present

## 2023-12-16 DIAGNOSIS — M79674 Pain in right toe(s): Secondary | ICD-10-CM

## 2023-12-16 NOTE — Progress Notes (Signed)
 Subjective:  Patient ID: Frederick Guzman, male    DOB: 11/04/1950,   MRN: [id number]  Chief Complaint  Patient presents with   Diabetes    Toenails  Saw Dr. Alvia -    : A1c - 5.9    73 y.o. male presents for multiple concerns including concern of thickened elongated and painful nails that are difficult to trim. Requesting to have them trimmed today. Denies burning and tingling in their feet. Patient is diabetic and last A1c was  Lab Results  Component Value Date   HGBA1C 6.2 06/29/2023   .  PCP:  Alvia Bring, DO    . Denies any other pedal complaints. Denies n/v/f/c.   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   Arthritis    OA   Arthritis of wrist 05/28/2011   Biliary obstruction 09/04/2021   BPH (benign prostatic hyperplasia) 06/17/2011   BRONCHITIS, CHRONIC 03/10/2007   Qualifier: Diagnosis of   By: Shellia MD, Vineet       Cancer [facility]) 08/2014   melanoma on R shoulder & back - [facility]    Combined forms of age-related cataract of right eye 09/21/2018   COPD (chronic obstructive pulmonary disease) (HCC)    COPD with exacerbation (HCC) 04/13/2021   Diverticulitis    Dyspnea    with exertion   Elevated liver enzymes 09/03/2021   Generalized abdominal pain    GERD (gastroesophageal reflux disease)    HTN (hypertension)    Hyperlipemia    Hyperlipidemia 08/07/2020   Hypogonadism male 06/17/2011   Long term use of drug 04/19/2017   Lymphocytic colitis 04/2018   MDD (major depressive disorder) (HCC) 04/30/2011   OBSTRUCTIVE SLEEP APNEA 03/10/2007   Qualifier: Diagnosis of   By: Shellia MD, Vineet       Pain in joint of left shoulder 04/19/2017   Pain in right hand 08/04/2017   Pneumonia 06/2015, 10/2016   Positive depression screening 09/21/2016   Pulmonary nodules 10/23/2017   S/P laparoscopic cholecystectomy 09/03/2021   S/P shoulder replacement, left 01/30/2016    Status post shoulder surgery 08/09/2016   TOBACCO ABUSE 03/10/2007   Qualifier: Diagnosis of   By: Shellia MD, Vineet       Transaminitis 09/03/2021   Uncontrolled type 2 diabetes mellitus with hyperglycemia, without long-term current use of insulin  (HCC) 09/21/2016   Likely will need dual or triple therapy but will start with metformin  alone in case of intolerance. Close follow-up with home blood sugar readings. ?steroid rol    Objective:  Physical Exam: Vascular: DP/PT pulses 2/4 bilateral. CFT <3 seconds. Normal hair growth on digits. No edema.  Skin. No lacerations or abrasions bilateral feet. Nails 1-5 bilateral thickened and elongated with subungual debris.  Musculoskeletal: MMT 5/5 bilateral lower extremities in DF, PF, Inversion and Eversion. Deceased ROM in DF of ankle joint. Tender to the medial calcaneal tubercle left . No pain with achilles, PT or arch. No pain with calcaneal squeeze.  Neurological: Sensation intact to light touch.   Assessment:   1. Pain due to onychomycosis of toenails of both feet   2. Type 2 diabetes mellitus without complication, without long-term current use of insulin  (HCC)        Plan:  Patient was evaluated and treated and all questions answered. -Discussed and educated patient on diabetic foot care, especially with  regards to the vascular, neurological and musculoskeletal systems.  -  Stressed the importance of good glycemic control and the detriment of not  controlling glucose levels in relation to the foot. -Discussed supportive shoes at all times and checking feet regularly.  -Mechanically debrided all nails 1-5 bilateral using sterile nail nipper and filed with dremel without incident  Follow-up 3months for rfc.    Asberry Failing, DPM

## 2023-12-17 DIAGNOSIS — Z23 Encounter for immunization: Secondary | ICD-10-CM | POA: Diagnosis not present

## 2023-12-19 DIAGNOSIS — N5201 Erectile dysfunction due to arterial insufficiency: Secondary | ICD-10-CM | POA: Diagnosis not present

## 2023-12-19 DIAGNOSIS — R350 Frequency of micturition: Secondary | ICD-10-CM | POA: Diagnosis not present

## 2023-12-19 DIAGNOSIS — Z87891 Personal history of nicotine dependence: Secondary | ICD-10-CM | POA: Diagnosis not present

## 2023-12-19 DIAGNOSIS — N401 Enlarged prostate with lower urinary tract symptoms: Secondary | ICD-10-CM | POA: Diagnosis not present

## 2023-12-20 ENCOUNTER — Telehealth: Payer: Self-pay

## 2023-12-20 NOTE — Telephone Encounter (Signed)
 Forwarding to [place] as an Financial planner.  Frederick  Novo Guzman PAP shipment received today for: Frederick  Guzman   100 u /4 boxes  Please contact the patient to come and pick up their order today. Medication(s) placed in the PAP fridge with patient identifier. Thanks in advance.   NDC: 9830-7339-84 LOT: [id number] EXP: 2026-02-18

## 2023-12-23 DIAGNOSIS — J3089 Other allergic rhinitis: Secondary | ICD-10-CM | POA: Diagnosis not present

## 2023-12-23 DIAGNOSIS — G4733 Obstructive sleep apnea (adult) (pediatric): Secondary | ICD-10-CM | POA: Diagnosis not present

## 2023-12-23 DIAGNOSIS — J441 Chronic obstructive pulmonary disease with (acute) exacerbation: Secondary | ICD-10-CM | POA: Diagnosis not present

## 2023-12-23 DIAGNOSIS — Z87891 Personal history of nicotine dependence: Secondary | ICD-10-CM | POA: Diagnosis not present

## 2023-12-23 DIAGNOSIS — R918 Other nonspecific abnormal finding of lung field: Secondary | ICD-10-CM | POA: Diagnosis not present

## 2023-12-26 ENCOUNTER — Telehealth: Payer: Self-pay

## 2023-12-26 DIAGNOSIS — Z23 Encounter for immunization: Secondary | ICD-10-CM | POA: Diagnosis not present

## 2023-12-26 DIAGNOSIS — E291 Testicular hypofunction: Secondary | ICD-10-CM

## 2023-12-26 NOTE — Progress Notes (Unsigned)
   12/26/2023  Patient ID: [name], male   DOB: [birth year], 73 y.o.   MRN: [id number]  Returning missed call/voicemail from patient stating he received a text notification from [facility] stating his Testosterone  was ready for $120.  Patient has not been paying this for the medication after a prior authorization was approved a few months ago.  I contacted the pharmacy, and they state the prescription does not have a refill on it and was not even in process.  Refill pending to go to [facility], and I will contact them to verify coverage and check copay.  [name], PharmD, DPLA

## 2023-12-27 MED ORDER — TESTOSTERONE 30 MG/ACT TD SOLN: TRANSDERMAL | 1 refills | Status: DC

## 2023-12-28 ENCOUNTER — Telehealth: Payer: Self-pay

## 2023-12-28 DIAGNOSIS — R911 Solitary pulmonary nodule: Secondary | ICD-10-CM | POA: Diagnosis not present

## 2023-12-28 DIAGNOSIS — K6389 Other specified diseases of intestine: Secondary | ICD-10-CM | POA: Diagnosis not present

## 2023-12-28 DIAGNOSIS — E291 Testicular hypofunction: Secondary | ICD-10-CM

## 2023-12-28 MED ORDER — TESTOSTERONE 30 MG/ACT TD SOLN: TRANSDERMAL | 1 refills | Status: DC

## 2023-12-28 NOTE — Progress Notes (Signed)
   12/28/2023  Patient ID: Frederick Guzman, male   DOB: [birth year], 73 y.o.   MRN: [id number]  [facility] to see check on processing of patient's Testosterone , and this medication is on backorder through their wholesaler.  Patient was able to refill this last at [facility], so I have asked [facility] to cancel the prescription received yesterday.  Order pending to go to [facility]; and once  signed, I will contact pharmacy to verify coverage and check copay for patient.  [name], PharmD, DPLA

## 2023-12-29 ENCOUNTER — Telehealth: Payer: Self-pay

## 2023-12-29 DIAGNOSIS — E291 Testicular hypofunction: Secondary | ICD-10-CM

## 2023-12-29 NOTE — Progress Notes (Unsigned)
   12/29/2023  Patient ID: Frederick Guzman, male   DOB: 07-25-1950, 72 y.o.   MRN: [id number]  Contacted [facility], and patient's Testosterone  has to be ordered but is going through on insurance for $87.  This is approximately twice as much as patient patient in June, but the pharmacy states both claims were billed for a 40 day supply.  Contacted insurance to inquire about cost increase of medication, and they state this is because drug cost has increased.  Per insurance, Testosterone  1.62% Gel Pump is a lower tier and cheaper on insurance.  Test claim reflects $25 copay for patient.  Patient is currently only using 1 pump of 30mg /actuation solution every 2-3 days based on last T level, so I recommend changing to 1.62% gel administering 1 pump daily for 20.25mg  dose.  Patient is in agreement and sees Dr. Alvia again tomorrow morning.  Order pending for provider to sign if in agreement- note to pharmacy stating this will replace previous order for testosterone  solution.  [name], PharmD, DPLA

## 2023-12-30 ENCOUNTER — Telehealth: Payer: Self-pay | Admitting: Gastroenterology

## 2023-12-30 ENCOUNTER — Ambulatory Visit: Admitting: Family Medicine

## 2023-12-30 ENCOUNTER — Encounter: Payer: Self-pay | Admitting: Family Medicine

## 2023-12-30 ENCOUNTER — Other Ambulatory Visit (HOSPITAL_COMMUNITY): Payer: Self-pay

## 2023-12-30 ENCOUNTER — Encounter: Payer: Self-pay | Admitting: Gastroenterology

## 2023-12-30 ENCOUNTER — Telehealth: Payer: Self-pay | Admitting: Family Medicine

## 2023-12-30 VITALS — BP 117/62 | HR 80 | Ht 73.0 in | Wt 200.0 lb

## 2023-12-30 DIAGNOSIS — Z7985 Long-term (current) use of injectable non-insulin antidiabetic drugs: Secondary | ICD-10-CM | POA: Diagnosis not present

## 2023-12-30 DIAGNOSIS — I10 Essential (primary) hypertension: Secondary | ICD-10-CM

## 2023-12-30 DIAGNOSIS — E291 Testicular hypofunction: Secondary | ICD-10-CM

## 2023-12-30 DIAGNOSIS — J410 Simple chronic bronchitis: Secondary | ICD-10-CM

## 2023-12-30 DIAGNOSIS — E1169 Type 2 diabetes mellitus with other specified complication: Secondary | ICD-10-CM | POA: Diagnosis not present

## 2023-12-30 DIAGNOSIS — R948 Abnormal results of function studies of other organs and systems: Secondary | ICD-10-CM | POA: Diagnosis not present

## 2023-12-30 DIAGNOSIS — J449 Chronic obstructive pulmonary disease, unspecified: Secondary | ICD-10-CM | POA: Diagnosis not present

## 2023-12-30 DIAGNOSIS — E1165 Type 2 diabetes mellitus with hyperglycemia: Secondary | ICD-10-CM

## 2023-12-30 LAB — POCT GLYCOSYLATED HEMOGLOBIN (HGB A1C): HbA1c, POC (controlled diabetic range): 5.8 % (ref 0.0–7.0)

## 2023-12-30 MED ORDER — TESTOSTERONE 20.25 MG/ACT (1.62%) TD GEL
TRANSDERMAL | 1 refills | Status: DC
  Filled 2023-12-30: qty 75, fill #0

## 2023-12-30 MED ORDER — TESTOSTERONE 20.25 MG/ACT (1.62%) TD GEL: TRANSDERMAL | 1 refills | Status: AC

## 2023-12-30 NOTE — Assessment & Plan Note (Signed)
 He has COPD along with asthma overlap.  He will continue on Breztri.  Continue albuterol as needed. Followed by pulmonology.

## 2023-12-30 NOTE — Progress Notes (Signed)
 Frederick Guzman - 73 y.o. male MRN [id number]  Date of birth: Jan 26, 1951  Subjective Chief Complaint  Patient presents with   Diabetes   Hypertension    HPI Frederick Guzman is a 73 y.o. male here today for follow up visit.   He reports that he is doing ok.  He had lung cancer screening along with follow up PET CT scan.  This did show hypermetabolic area of sigmoid colon.     He continues on lisinopril  for management of HTN.  BP is well controlled.  He denies side effects from medication at current strength.  He has not had chest pain, shortness of breath, palpitations, headache or vision changes.   Remains on tresiba  and ozempic  for management of diabetes.  Tolerating well.  A1c today is well controlled.   Tolerating atorvastatin  well for associated HLD.    Mood is stable with buproppion.    COPD stable with breztri daily and albuterol  PRN.   ROS:  A comprehensive ROS was completed and negative except as noted per HPI  Allergies  Allergen Reactions   Methotrexate And Trimetrexate Nausea And Vomiting and Other (See Comments)    Hallucinations and aggressive behavior     Tramadol Nausea And Vomiting    Hallucinations and aggressive behavior     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   Arthritis    OA   Arthritis of wrist 05/28/2011   Biliary obstruction (HCC) 09/04/2021   BPH (benign prostatic hyperplasia) 06/17/2011   BRONCHITIS, CHRONIC 03/10/2007   Qualifier: Diagnosis of   By: Shellia MD, Vineet       Cancer [facility]) 08/2014   melanoma on R shoulder & back - [facility]    Combined forms of age-related cataract of right eye 09/21/2018   COPD (chronic obstructive pulmonary disease) (HCC)    COPD with exacerbation (HCC) 04/13/2021   Diverticulitis    Dyspnea    with exertion   Elevated liver enzymes 09/03/2021   Generalized abdominal pain    GERD (gastroesophageal reflux  disease)    HTN (hypertension)    Hyperlipemia    Hyperlipidemia 08/07/2020   Hypogonadism male 06/17/2011   Long term use of drug 04/19/2017   Lymphocytic colitis 04/2018   MDD (major depressive disorder) (HCC) 04/30/2011   OBSTRUCTIVE SLEEP APNEA 03/10/2007   Qualifier: Diagnosis of   By: Shellia MD, Vineet       Pain in joint of left shoulder 04/19/2017   Pain in right hand 08/04/2017   Pneumonia 06/2015, 10/2016   Positive depression screening 09/21/2016   Pulmonary nodules 10/23/2017   S/P laparoscopic cholecystectomy 09/03/2021   S/P shoulder replacement, left 01/30/2016   Status post shoulder surgery 08/09/2016   TOBACCO ABUSE 03/10/2007   Qualifier: Diagnosis of   By: Shellia MD, Vineet       Transaminitis 09/03/2021   Uncontrolled type 2 diabetes mellitus with hyperglycemia, without long-term current use of insulin  (HCC) 09/21/2016   Likely will need dual or triple therapy but will start with metformin  alone in case of intolerance. Close follow-up with home blood sugar readings. ?steroid rol    Past Surgical History:  Procedure Laterality Date   carpel tunnel Bilateral    CHOLECYSTECTOMY N/A 08/31/2021   Procedure: LAPAROSCOPIC CHOLECYSTECTOMY AND UMBILICAL HERNIA REPAIR;  Surgeon: Belinda Cough, MD;  Location: [facility] ORS;  Service: General;  Laterality: N/A;   COLONOSCOPY W/  POLYPECTOMY     COLONOSCOPY W/ POLYPECTOMY     EXCISIONAL TOTAL SHOULDER ARTHROPLASTY WITH ANTIBIOTIC SPACER Left 08/09/2016   Procedure: LEFT SHOULDER HARDWARE REMOVAL, IRRIGATION AND DEBRIDEMENT, PLACEMENT OF ANTIBIOTIC SPACER;  Surgeon: Kay Kemps, MD;  Location: [facility] OR;  Service: Orthopedics;  Laterality: Left;   IRRIGATION AND DEBRIDEMENT SHOULDER Left 03/31/2016   Procedure: IRRIGATION AND DEBRIDEMENT SHOULDER, deep cultures and polyethylene exchange;  Surgeon: Kemps Kay, MD;  Location: [facility] OR;  Service: Orthopedics;  Laterality: Left;  requests   REVERSE SHOULDER ARTHROPLASTY Left 01/30/2016    Procedure: LEFT REVERSE SHOULDER ARTHROPLASTY;  Surgeon: Kemps Kay, MD;  Location: [facility] OR;  Service: Orthopedics;  Laterality: Left;   ROTATOR CUFF REPAIR Left    SHOULDER ARTHROSCOPY WITH DISTAL CLAVICLE RESECTION Right 01/30/2018   Procedure: RIGHT SHOULDER ARTHROSCOPY WITH OPEN DISTAL CLAVICLE RESECTION, SUBACROMIAL DECOMPRESSION;  Surgeon: Kay Kemps, MD;  Location: [facility] OR;  Service: Orthopedics;  Laterality: Right;   STERIOD INJECTION Right 01/30/2018   Procedure: STEROID INJECTION;  Surgeon: Kay Kemps, MD;  Location: [facility] OR;  Service: Orthopedics;  Laterality: Right;   TOTAL SHOULDER REVISION Left 01/17/2017   Procedure: LEFT SHOULDER REVISION REVERSE TOTAL SHOULDER ARTHROPLASTY;  Surgeon: Kay Kemps, MD;  Location: [facility] OR;  Service: Orthopedics;  Laterality: Left;   UMBILICAL HERNIA REPAIR N/A 08/31/2021   Procedure: HERNIA REPAIR UMBILICAL ADULT possible;  Surgeon: Belinda Cough, MD;  Location: [facility] ORS;  Service: General;  Laterality: N/A;   WRIST SURGERY Right 11/2014   Dr. Camella    Social History   Socioeconomic History   Marital status: Domestic Partner    Spouse name: Frederick Guzman   Number of children: 2   Years of education: 12   Highest education level: 12th grade  Occupational History   Occupation: lineman    Comment: retired  Tobacco Use   Smoking status: Former    Current packs/day: 0.00    Average packs/day: 0.8 packs/day for 43.0 years (32.3 ttl pk-yrs)    Types: Cigarettes    Start date: 03/1979    Quit date: 03/2022    Years since quitting: 1.7   Smokeless tobacco: Never   Tobacco comments:    pt no longer taking chantix   Vaping Use   Vaping status: Never Used  Substance and Sexual Activity   Alcohol use: Yes    Alcohol/week: 9.0 standard drinks of alcohol    Types: 6 Cans of beer, 3 Shots of liquor per week    Comment: beer or mixed drink    Drug use: No   Sexual activity: Yes  Other Topics Concern   Not on file  Social History Narrative   Lives  with his girlfriend, Frederick Guzman. Retired. DOes a lot of yard work, travelling in the RV and handy work. Members of the camping club.   Social Drivers of Health   Financial Resource Strain: Medium Risk (12/26/2023)   Overall Financial Resource Strain (CARDIA)    Difficulty of Paying Living Expenses: Somewhat hard  Food Insecurity: No Food Insecurity (12/26/2023)   Hunger Vital Sign    Worried About Running Out of Food in the Last Year: Never true    Ran Out of Food in the Last Year: Never true  Transportation Needs: No Transportation Needs (12/26/2023)   PRAPARE - Administrator, Civil Service (Medical): No    Lack of Transportation (Non-Medical): No  Physical Activity: Sufficiently Active (12/26/2023)   Exercise Vital Sign    Days of Exercise per Week:  5 days    Minutes of Exercise per Session: 60 min  Stress: Stress Concern Present (12/26/2023)   Frederick Guzman    Feeling of Stress: To some extent  Social Connections: Socially Integrated (12/26/2023)   Social Connection and Isolation Panel    Frequency of Communication with Friends and Family: Three times a week    Frequency of Social Gatherings with Friends and Family: Three times a week    Attends Religious Services: More than 4 times per year    Active Member of Clubs or Organizations: Yes    Attends Banker Meetings: More than 4 times per year    Marital Status: Living with partner    Family History  Problem Relation Age of Onset   Diabetes Mother    Colon cancer Father    Prostate cancer Father    Diabetes Father    Esophageal cancer Neg Hx    Rectal cancer Neg Hx    Stomach cancer Neg Hx     Health Maintenance  Topic Date Due   Influenza Vaccine  10/21/2023   COVID-19 Vaccine (7 - 2025-26 season) 11/21/2023   Lung Cancer Screening  12/03/2023   OPHTHALMOLOGY EXAM  12/02/2023   HEMOGLOBIN A1C  12/29/2023   Diabetic kidney evaluation -  eGFR measurement  06/28/2024   Diabetic kidney evaluation - Urine ACR  06/28/2024   FOOT EXAM  06/28/2024   Medicare Annual Wellness (AWV)  08/16/2024   Colonoscopy  05/11/2025   DTaP/Tdap/Td (3 - Td or Tdap) 02/18/2029   Pneumococcal Vaccine: 50+ Years  Completed   Hepatitis C Screening  Completed   Zoster Vaccines- Shingrix  Completed   Meningococcal B Vaccine  Aged Out     ----------------------------------------------------------------------------------------------------------------------------------------------------------------------------------------------------------------- Physical Exam BP 117/62 (BP Location: Left Arm, Patient Position: Sitting, Cuff Size: Large)   Pulse 80   Ht 6' 1 (1.854 m)   Wt 200 lb (90.7 kg)   SpO2 98%   BMI 26.39 kg/m   Physical Exam Constitutional:      Appearance: Normal appearance.  Eyes:     General: No scleral icterus. Cardiovascular:     Rate and Rhythm: Normal rate and regular rhythm.  Pulmonary:     Effort: Pulmonary effort is normal.     Breath sounds: Normal breath sounds.  Neurological:     Mental Status: He is alert.  Psychiatric:        Mood and Affect: Mood normal.        Behavior: Behavior normal.     ------------------------------------------------------------------------------------------------------------------------------------------------------------------------------------------------------------------- Assessment and Plan  HTN (hypertension) Blood pressure remains well-controlled.  He will continue current medications for management of hypertension.  Hyperlipidemia associated with type 2 diabetes mellitus (HCC) Doing well with ozempic  and tresiba  at current strength.  Unfortunately the patient assistance program for Ozempic  is being discontinued at the end of the year.  Will look at options at next follow up visit.   Continue atorvastatin  for associated HLD.   Abnormal PET scan of colon Urgent referral placed  to gastroenterology.   Hypogonadism male Current testosterone  formulation is expensive.  Changing to 1.62% pump.  Recheck levels at next visit.   COPD (chronic obstructive pulmonary disease) (HCC) He has COPD along with asthma overlap.  He will continue on Breztri.  Continue albuterol  as needed. Followed by pulmonology.    Meds ordered this encounter  Medications   DISCONTD: Testosterone  20.25 MG/ACT (1.62%) GEL    Sig: Apply 1 pump  to upper arms/shoulders daily.    Dispense:  75 g    Refill:  1   Testosterone  20.25 MG/ACT (1.62%) GEL    Sig: Apply 1 pump to upper arms/shoulders daily.    Dispense:  75 g    Refill:  1    Return in about 6 months (around 06/29/2024).

## 2023-12-30 NOTE — Assessment & Plan Note (Signed)
 Urgent referral placed to gastroenterology.

## 2023-12-30 NOTE — Telephone Encounter (Signed)
 Copied from CRM [id number]. Topic: Clinical - Prescription Issue >> Dec 30, 2023  4:34 PM Zebedee SAUNDERS wrote: Reason for CRM: Received call from [facility] [id number] per [place] Phone: [phone] Fax: [phone] regarding Testosterone  20.25 MG/ACT (1.62%) GEL , is it one pump per day or per shoulder?

## 2023-12-30 NOTE — Assessment & Plan Note (Signed)
 Blood pressure remains well-controlled.  He will continue current medications for management of hypertension.

## 2023-12-30 NOTE — Assessment & Plan Note (Addendum)
 Doing well with ozempic  and tresiba  at current strength.  Unfortunately the patient assistance program for Ozempic  is being discontinued at the end of the year.  Will look at options at next follow up visit.   Continue atorvastatin  for associated HLD.

## 2023-12-30 NOTE — Telephone Encounter (Signed)
 I spoke to the pt and discussed sooner appt for abnormal CT scan. Appt moved to 10/30 at 220 pm with Dr Legrand.  He agrees and has also beenadded to a wait list

## 2023-12-30 NOTE — Telephone Encounter (Signed)
 Good afternoon Dr. Legrand,   I received a call from this patient requesting to have a sooner colonoscopy due to him having an abnormal PET scan. Patient had a colonoscopy in February 2020. Patient does have a recall for February 2027.Would you please advise on how to schedule this patient.   Thank you.

## 2023-12-30 NOTE — Telephone Encounter (Signed)
 PT would like to speak with nurse regarding earlier appointment. Please advise.

## 2023-12-30 NOTE — Assessment & Plan Note (Signed)
 Current testosterone  formulation is expensive.  Changing to 1.62% pump.  Recheck levels at next visit.

## 2024-01-02 NOTE — Telephone Encounter (Signed)
(  Hypermetabolic focus in sigmoid colon on PET scan to evaluate abnormal screening lung CT.   2 polyps and sigmoid diverticulosis on 2020 colonoscopy) ___________   This patient needs to be evaluated in clinic before any procedures due to his underlying COPD.  He has a clinic appointment already scheduled with me on 01/19/2024, and we will see him then.  VEAR Brand MD

## 2024-01-05 DIAGNOSIS — R911 Solitary pulmonary nodule: Secondary | ICD-10-CM | POA: Diagnosis not present

## 2024-01-05 NOTE — Telephone Encounter (Signed)
 Returned call to [employer]. Provided sig clarification: 1 pump per shoulder per Dr. Alvia.

## 2024-01-16 ENCOUNTER — Encounter: Payer: Self-pay | Admitting: Family Medicine

## 2024-01-19 ENCOUNTER — Encounter: Payer: Self-pay | Admitting: Gastroenterology

## 2024-01-19 ENCOUNTER — Ambulatory Visit: Admitting: Gastroenterology

## 2024-01-19 VITALS — BP 118/48 | HR 89 | Ht 73.0 in | Wt 200.1 lb

## 2024-01-19 DIAGNOSIS — R933 Abnormal findings on diagnostic imaging of other parts of digestive tract: Secondary | ICD-10-CM | POA: Diagnosis not present

## 2024-01-19 DIAGNOSIS — R194 Change in bowel habit: Secondary | ICD-10-CM | POA: Diagnosis not present

## 2024-01-19 MED ORDER — NA SULFATE-K SULFATE-MG SULF 17.5-3.13-1.6 GM/177ML PO SOLN: 1.0000 | Freq: Once | ORAL | 0 refills | Status: AC

## 2024-01-19 NOTE — Patient Instructions (Addendum)
 You have been scheduled for a colonoscopy. Please follow written instructions given to you at your visit today.   If you use inhalers (even only as needed), please bring them with you on the day of your procedure.  DO NOT TAKE 7 DAYS PRIOR TO TEST- Trulicity (dulaglutide) Ozempic , Wegovy  (semaglutide ) Mounjaro (tirzepatide) Bydureon Bcise (exanatide extended release)  DO NOT TAKE 1 DAY PRIOR TO YOUR TEST Rybelsus  (semaglutide ) Adlyxin (lixisenatide) Victoza (liraglutide) Byetta (exanatide) ___________________________________________________________________________  Thank you for trusting me with your gastrointestinal care!    Dr. Victory Legrand [facility]

## 2024-01-19 NOTE — Progress Notes (Signed)
 [facility] Consult Note:  History: Frederick Guzman 01/19/2024  Referring provider: Alvia Bring, DO  Reason for consult/chief complaint: Follow-up (Patient here to discuss results of what was found on PET scan.  Told that it needed immediate attention.), Constipation (Patient stated that he has constipation every now and then causing pain. Patient stated that since having his gallbladder removed every now and then he notice a light brown mucus like substance in his stool.), and Hemorrhoids (Patient stated that from time to time he has problems with hemorrhoids.)  Pertinent GI history: Last seen by me in office May 2020 after diagnosis of lymphocytic colitis causing diarrhea and urgency.  Patient was getting better on budesonide .  Plan was to continue it with eventual plans for tapering and to see patient back in about 2 months, though no return visit afterward.  Advised trying to get off PPI, but he felt unable to do so due to GERD symptoms.  Referred back to see us  3 weeks ago for abnormal findings on CT/PET scan at outside hospital done to workup nodules on screening lung CT scan:  (Hypermetabolic focus in sigmoid colon on PET scan to evaluate abnormal screening lung CT.   2 polyps and sigmoid diverticulosis on 2020 colonoscopy)    Subjective  HPI:  Frederick Guzman was here to see me with concerns about the recent PET scan findings.  He describes a mild change in bowel habits that has happened since a 2023 colonoscopy.  He will get intermittent constipation that might cause lower abdominal bloating.  When that occurs, the end of the bowel movement may be a light brown mucus.  Denies rectal bleeding.  Appetite is good without dysphagia nausea vomiting or weight loss.  ______  [name] got another opinion from the pulmonologist in the [facility] system on 12/23/23 and 01/05/2024, is designated COPD Gold 2, and has opted to undergo a navigational bronchoscopy for  further evaluation of the lung nodule.  ROS:  Review of Systems  Constitutional:  Negative for appetite change and unexpected weight change.  HENT:  Negative for mouth sores and voice change.   Eyes:  Negative for pain and redness.  Respiratory:  Positive for shortness of breath. Negative for cough.   Cardiovascular:  Negative for chest pain and palpitations.  Genitourinary:  Negative for dysuria and hematuria.  Musculoskeletal:  Negative for arthralgias and myalgias.  Skin:  Negative for pallor and rash.  Neurological:  Negative for weakness and headaches.  Hematological:  Negative for adenopathy.  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   Arthritis    OA   Arthritis of wrist 05/28/2011   Biliary obstruction (HCC) 09/04/2021   BPH (benign prostatic hyperplasia) 06/17/2011   BRONCHITIS, CHRONIC 03/10/2007   Qualifier: Diagnosis of   By: Shellia MD, Vineet       Cancer [facility]) 08/2014   melanoma on R shoulder & back - [facility]    Combined forms of age-related cataract of right eye 09/21/2018   COPD (chronic obstructive pulmonary disease) (HCC)    COPD with exacerbation (HCC) 04/13/2021   Diverticulitis    Dyspnea    with exertion   Elevated liver enzymes 09/03/2021   Generalized abdominal pain    GERD (gastroesophageal reflux disease)    HTN (hypertension)  Hyperlipemia    Hyperlipidemia 08/07/2020   Hypogonadism male 06/17/2011   Long term use of drug 04/19/2017   Lymphocytic colitis 04/2018   MDD (major depressive disorder) (HCC) 04/30/2011   OBSTRUCTIVE SLEEP APNEA 03/10/2007   Qualifier: Diagnosis of   By: Shellia MD, Vineet       Pain in joint of left shoulder 04/19/2017   Pain in right hand 08/04/2017   Pneumonia 06/2015, 10/2016   Positive depression screening 09/21/2016   Pulmonary nodules 10/23/2017    S/P laparoscopic cholecystectomy 09/03/2021   S/P shoulder replacement, left 01/30/2016   Status post shoulder surgery 08/09/2016   TOBACCO ABUSE 03/10/2007   Qualifier: Diagnosis of   By: Shellia MD, Vineet       Transaminitis 09/03/2021   Uncontrolled type 2 diabetes mellitus with hyperglycemia, without long-term current use of insulin  (HCC) 09/21/2016   Likely will need dual or triple therapy but will start with metformin  alone in case of intolerance. Close follow-up with home blood sugar readings. ?steroid rol     Past Surgical History: Past Surgical History:  Procedure Laterality Date   carpel tunnel Bilateral    CHOLECYSTECTOMY N/A 08/31/2021   Procedure: LAPAROSCOPIC CHOLECYSTECTOMY AND UMBILICAL HERNIA REPAIR;  Surgeon: Belinda Cough, MD;  Location: [facility] OR[facility];  Service: General;  Laterality: N/A;   COLONOSCOPY W/ POLYPECTOMY     COLONOSCOPY W/ POLYPECTOMY     EXCISIONAL TOTAL SHOULDER ARTHROPLASTY WITH ANTIBIOTIC SPACER Left 08/09/2016   Procedure: LEFT SHOULDER HARDWARE REMOVAL, IRRIGATION AND DEBRIDEMENT, PLACEMENT OF ANTIBIOTIC SPACER;  Surgeon: Kay Kemps, MD;  Location: [facility] OR;  Service: Orthopedics;  Laterality: Left;   IRRIGATION AND DEBRIDEMENT SHOULDER Left 03/31/2016   Procedure: IRRIGATION AND DEBRIDEMENT SHOULDER, deep cultures and polyethylene exchange;  Surgeon: Kemps Kay, MD;  Location: [facility] OR;  Service: Orthopedics;  Laterality: Left;  requests   REVERSE SHOULDER ARTHROPLASTY Left 01/30/2016   Procedure: LEFT REVERSE SHOULDER ARTHROPLASTY;  Surgeon: Kemps Kay, MD;  Location: [facility] OR;  Service: Orthopedics;  Laterality: Left;   ROTATOR CUFF REPAIR Left    SHOULDER ARTHROSCOPY WITH DISTAL CLAVICLE RESECTION Right 01/30/2018   Procedure: RIGHT SHOULDER ARTHROSCOPY WITH OPEN DISTAL CLAVICLE RESECTION, SUBACROMIAL DECOMPRESSION;  Surgeon: Kay Kemps, MD;  Location: [facility] OR;  Service: Orthopedics;  Laterality: Right;   STERIOD INJECTION Right 01/30/2018    Procedure: STEROID INJECTION;  Surgeon: Kay Kemps, MD;  Location: [facility] OR;  Service: Orthopedics;  Laterality: Right;   TOTAL SHOULDER REVISION Left 01/17/2017   Procedure: LEFT SHOULDER REVISION REVERSE TOTAL SHOULDER ARTHROPLASTY;  Surgeon: Kay Kemps, MD;  Location: [facility] OR;  Service: Orthopedics;  Laterality: Left;   UMBILICAL HERNIA REPAIR N/A 08/31/2021   Procedure: HERNIA REPAIR UMBILICAL ADULT possible;  Surgeon: Belinda Cough, MD;  Location: [facility];  Service: General;  Laterality: N/A;   WRIST SURGERY Right 11/2014   Dr. Camella     Family History: Family History  Problem Relation Age of Onset   Diabetes Mother    Colon cancer Father    Prostate cancer Father    Diabetes Father    Esophageal cancer Neg Hx    Rectal cancer Neg Hx    Stomach cancer Neg Hx     Social History: Social History   Socioeconomic History   Marital status: Domestic Partner    Spouse name: Arlean   Number of children: 2   Years of education: 12   Highest education level: 12th grade  Occupational History   Occupation: lineman    Comment: retired  Tobacco Use   Smoking status: Former    Current packs/day: 0.00    Average packs/day: 0.8 packs/day for 43.0 years (32.3 ttl pk-yrs)    Types: Cigarettes    Start date: 03/1979    Quit date: 03/2022    Years since quitting: 1.8   Smokeless tobacco: Never   Tobacco comments:    pt no longer taking chantix   Vaping Use   Vaping status: Never Used  Substance and Sexual Activity   Alcohol use: Yes    Alcohol/week: 9.0 standard drinks of alcohol    Types: 6 Cans of beer, 3 Shots of liquor per week    Comment: beer or mixed drink    Drug use: No   Sexual activity: Yes  Other Topics Concern   Not on file  Social History Narrative   Lives with his girlfriend, Arlean. Retired. DOes a lot of yard work, travelling in the RV and handy work. Members of the camping club.   Social Drivers of Health   Financial Resource Strain: Medium Risk (12/26/2023)    Overall Financial Resource Strain (CARDIA)    Difficulty of Paying Living Expenses: Somewhat hard  Food Insecurity: No Food Insecurity (12/26/2023)   Hunger Vital Sign    Worried About Running Out of Food in the Last Year: Never true    Ran Out of Food in the Last Year: Never true  Transportation Needs: No Transportation Needs (12/26/2023)   PRAPARE - Administrator, Civil Service (Medical): No    Lack of Transportation (Non-Medical): No  Physical Activity: Sufficiently Active (12/26/2023)   Exercise Vital Sign    Days of Exercise per Week: 5 days    Minutes of Exercise per Session: 60 min  Stress: Stress Concern Present (12/26/2023)   [employer] of Occupational Health - Occupational Stress Questionnaire    Feeling of Stress: To some extent  Social Connections: Socially Integrated (12/26/2023)   Social Connection and Isolation Panel    Frequency of Communication with Friends and Family: Three times a week    Frequency of Social Gatherings with Friends and Family: Three times a week    Attends Religious Services: More than 4 times per year    Active Member of Clubs or Organizations: Yes    Attends Banker Meetings: More than 4 times per year    Marital Status: Living with partner    Allergies: Allergies  Allergen Reactions   Methotrexate And Trimetrexate Nausea And Vomiting and Other (See Comments)    Hallucinations and aggressive behavior     Tramadol Nausea And Vomiting    Hallucinations and aggressive behavior     Outpatient Meds: Current Outpatient Medications  Medication Sig Dispense Refill   albuterol  (PROVENTIL ) (2.5 MG/3ML) 0.083% nebulizer solution Take 2.5 mg by nebulization every 6 (six) hours as needed for wheezing or shortness of breath.     albuterol  (VENTOLIN  HFA) 108 (90 Base) MCG/ACT inhaler Inhale 2 puffs into the lungs every 6 (six) hours as needed for wheezing or shortness of breath.     atorvastatin  (LIPITOR) 40 MG tablet Take  1 tablet (40 mg total) by mouth every other day. 90 tablet 3   b complex vitamins tablet Take 1 tablet by mouth daily.     Budeson-Glycopyrrol-Formoterol  (BREZTRI AEROSPHERE) 160-9-4.8 MCG/ACT AERO Inhale 1 puff into the lungs daily.     buPROPion  (WELLBUTRIN  XL) 150 MG 24 hr tablet Take 1 tablet (150 mg total) by mouth daily. 90 tablet 1  cetirizine (ZYRTEC) 10 MG tablet Take 10 mg by mouth daily as needed for allergies.      ciclopirox  (PENLAC ) 8 % solution Apply topically at bedtime. Apply over nail & surrounding skin. Apply daily over previous coat. After 7 days, may remove with alcohol & continue cycle. 6.6 mL 0   Continuous Glucose Receiver (FREESTYLE LIBRE 3 READER) DEVI Use to monitor glucose continuously. 1 each 0   Continuous Glucose Sensor (FREESTYLE LIBRE 3 PLUS SENSOR) MISC Change sensor every 15 days. 6 each 0   esomeprazole  (NEXIUM ) 40 MG capsule TAKE ONE CAPSULE BY MOUTH EVERY DAY 90 capsule 2   fluticasone  (FLONASE ) 50 MCG/ACT nasal spray Place 1 spray into both nostrils daily as needed for allergies.  1   insulin  degludec (TRESIBA  FLEXTOUCH) 100 UNIT/ML FlexTouch Pen Inject 20 Units into the skin 2 (two) times daily. Do not bill insurance-please bill to manufacture provided free 30-day voucher: [phone], PCN-CNRX, GRP--[id number], [id number] 15 mL 0   lisinopril  (ZESTRIL ) 2.5 MG tablet Take 1 tablet (2.5 mg total) by mouth at bedtime. 90 tablet 1   meloxicam  (MOBIC ) 15 MG tablet Take 1 tablet (15 mg total) by mouth daily. 30 tablet 0   Multiple Vitamin (MULTIVITAMIN WITH MINERALS) TABS tablet Take 1 tablet by mouth at bedtime.     nitroGLYCERIN  (NITROSTAT ) 0.4 MG SL tablet Place 1 tablet (0.4 mg total) under the tongue every 5 (five) minutes as needed for chest pain. 25 tablet 6   NOVOFINE PEN NEEDLE 32G X 6 MM MISC Used to inject Tresiba  twice daily.Do not bill insurance-please bill to manufacture provided free 30-day voucher: [phone], PCN-CNRX, GRP--[id number],  [id number] 100 each 0   Omega-3 Fatty Acids (FISH OIL) 1000 MG CAPS Take 1,000 mg by mouth at bedtime.      Polyvinyl Alcohol-Povidone (REFRESH OP) Place 1 drop into both eyes daily as needed (dry eyes).     Semaglutide , 1 MG/DOSE, (OZEMPIC , 1 MG/DOSE,) 4 MG/3ML SOPN Inject 1 mg into the skin once a week. Do not bill insurance- bill to [employer]: [phone] PCN-CNRX [phone] PI-[id number] 3 mL 0   tacrolimus  (PROTOPIC ) 0.1 % ointment Apply 1 application topically daily.     tadalafil  (CIALIS ) 20 MG tablet TAKE ONE TABLET BY MOUTH DAILY AS NEEDED FOR ERECTILE DYSFUNCTION 30 tablet 3   tamsulosin  (FLOMAX ) 0.4 MG CAPS capsule Take 1 capsule (0.4 mg total) by mouth at bedtime. 30 capsule 2   Testosterone  20.25 MG/ACT (1.62%) GEL Apply 1 pump to upper arms/shoulders daily. 75 g 1   torsemide  (DEMADEX ) 100 MG tablet Take 1 tablet (100 mg total) by mouth daily. 90 tablet 1   vitamin C  (ASCORBIC ACID ) 500 MG tablet Take 500 mg by mouth daily.     No current facility-administered medications for this visit.      ___________________________________________________________________ Objective   Exam:  BP (!) 118/48   Pulse 89   Ht 6' 1 (1.854 m)   Wt 200 lb 2 oz (90.8 kg)   BMI 26.40 kg/m  Wt Readings from Last 3 Encounters:  01/19/24 200 lb 2 oz (90.8 kg)  12/30/23 200 lb (90.7 kg)  08/17/23 200 lb (90.7 kg)   Ambulatory, pleasant and conversational and breathing comfortably on room air.  Eyes: sclera anicteric, no redness ENT: oral mucosa moist without lesions, no cervical or supraclavicular lymphadenopathy CV: Regular without appreciable murmur, no JVD, no peripheral edema Resp: Decreased breath sounds bilaterally, no wheezing or rales, normal RR and effort noted GI: soft, no  tenderness, with active bowel sounds. No guarding or palpable organomegaly noted. Skin; warm and dry, no rash or jaundice noted Neuro: awake, alert and oriented x 3. Normal gross motor function and  fluent speech    Radiologic Studies:  IMPRESSION: 1. Right upper lobe nodule is not hypermetabolic but is technically below size criteria for PET resolution.. 2. Focal area of hypermetabolic activity in the sigmoid colon. Consider follow-up sigmoidoscopy/colonoscopy.  Electronically Signed by: Glendia Guillaume, MD on 12/29/2023 8:29 AM Narrative  The INDICATION: growing lung nodule, please reformat SuperD for possible bronch  TECHNIQUE: 9 millicuries of F-18 FDG was administered intravenously. PET imaging was obtained from the skull base to the mid thighs. CT images were obtained for attenuation correction and localization purposes. Glucose level was 84 MG/DL. Time from injection to scan was 60 minutes. Comparison chest CT 12/19/2023  FINDINGS: Reference SUV Max of normal liver parenchyma is 1.9.  Right upper lobe nodule on series 7 image 59 measures 6.6 mm with some surrounding groundglass density. SUV max in this region is 1.0.  Focal area of hypermetabolic activity within the sigmoid colon on image 226 with SUV Max of 14.1.  Cholecystectomy. No biliary ductal dilatation. No renal stone or hydronephrosis. Enlarged prostate. Emphysema. Procedure Note  Guillaume Glendia, MD - 12/29/2023 Formatting of this note might be different from the original. The INDICATION: growing lung nodule, please reformat SuperD for possible bronch  TECHNIQUE: 9 millicuries of F-18 FDG was administered intravenously. PET imaging was obtained from the skull base to the mid thighs. CT images were obtained for attenuation correction and localization purposes. Glucose level was 84 MG/DL. Time from injection to scan was 60 minutes. Comparison chest CT 12/19/2023  FINDINGS: Reference SUV Max of normal liver parenchyma is 1.9.  Right upper lobe nodule on series 7 image 59 measures 6.6 mm with some surrounding groundglass density. SUV max in this region is 1.0.  Focal area of hypermetabolic activity within the sigmoid  colon on image 226 with SUV Max of 14.1.  Cholecystectomy. No biliary ductal dilatation. No renal stone or hydronephrosis. Enlarged prostate. Emphysema.   IMPRESSION: 1. Right upper lobe nodule is not hypermetabolic but is technically below size criteria for PET resolution.. 2. Focal area of hypermetabolic activity in the sigmoid colon. Consider follow-up sigmoidoscopy/colonoscopy.  Electronically Signed by: Glendia Guillaume, MD on 12/29/2023 8:29 AM Resulting Agency Comment   Assessment: Encounter Diagnoses  Name Primary?   Change in bowel habits Yes   Abnormal finding on GI tract imaging     Benign sounding change in bowel habits after cholecystectomy 2 years ago with occasional constipation and resultant lower abdominal bloating discomfort.  Increase metabolic activity in the left colon on recent PET scan.  Could be related to haustral thickening and increased muscular metabolic activity, polyp, malignancy. Nontender on exam.  Previous history of lymphocytic colitis that seems to have resolved with no further diarrhea for years while still on PPI.  Plan:  Colonoscopy advised within the next several weeks and he was agreeable after discussion of procedure and risks.  The benefits and risks of the planned procedure(s) were described in detail with the patient or (when appropriate) their health care proxy.  Risks were outlined as including, but not limited to, bleeding, infection, perforation, adverse medication reaction leading to cardiac or pulmonary decompensation, pancreatitis (if ERCP).  The limitation of incomplete mucosal visualization was also discussed.  No guarantees or warranties were given.  Patient at increased risk for cardiopulmonary complications of procedure due  to medical comorbidities.  (COPD Gold 2)   Thank you for the courtesy of this consult.  Please call me with any questions or concerns.  Victory LITTIE Brand III  CC: Referring provider noted above

## 2024-01-24 DIAGNOSIS — J449 Chronic obstructive pulmonary disease, unspecified: Secondary | ICD-10-CM | POA: Diagnosis not present

## 2024-01-24 DIAGNOSIS — F32A Depression, unspecified: Secondary | ICD-10-CM | POA: Diagnosis not present

## 2024-01-24 DIAGNOSIS — Z7985 Long-term (current) use of injectable non-insulin antidiabetic drugs: Secondary | ICD-10-CM | POA: Diagnosis not present

## 2024-01-24 DIAGNOSIS — Z885 Allergy status to narcotic agent status: Secondary | ICD-10-CM | POA: Diagnosis not present

## 2024-01-24 DIAGNOSIS — Z79899 Other long term (current) drug therapy: Secondary | ICD-10-CM | POA: Diagnosis not present

## 2024-01-24 DIAGNOSIS — Z48813 Encounter for surgical aftercare following surgery on the respiratory system: Secondary | ICD-10-CM | POA: Diagnosis not present

## 2024-01-24 DIAGNOSIS — R918 Other nonspecific abnormal finding of lung field: Secondary | ICD-10-CM | POA: Diagnosis not present

## 2024-01-24 DIAGNOSIS — Z87891 Personal history of nicotine dependence: Secondary | ICD-10-CM | POA: Diagnosis not present

## 2024-01-24 DIAGNOSIS — R911 Solitary pulmonary nodule: Secondary | ICD-10-CM | POA: Diagnosis not present

## 2024-01-24 DIAGNOSIS — N400 Enlarged prostate without lower urinary tract symptoms: Secondary | ICD-10-CM | POA: Diagnosis not present

## 2024-01-24 DIAGNOSIS — I10 Essential (primary) hypertension: Secondary | ICD-10-CM | POA: Diagnosis not present

## 2024-01-24 DIAGNOSIS — Z888 Allergy status to other drugs, medicaments and biological substances status: Secondary | ICD-10-CM | POA: Diagnosis not present

## 2024-01-24 DIAGNOSIS — E119 Type 2 diabetes mellitus without complications: Secondary | ICD-10-CM | POA: Diagnosis not present

## 2024-01-24 DIAGNOSIS — L4050 Arthropathic psoriasis, unspecified: Secondary | ICD-10-CM | POA: Diagnosis not present

## 2024-01-24 DIAGNOSIS — G4733 Obstructive sleep apnea (adult) (pediatric): Secondary | ICD-10-CM | POA: Diagnosis not present

## 2024-01-25 DIAGNOSIS — R846 Abnormal cytological findings in specimens from respiratory organs and thorax: Secondary | ICD-10-CM | POA: Diagnosis not present

## 2024-01-25 DIAGNOSIS — R911 Solitary pulmonary nodule: Secondary | ICD-10-CM | POA: Diagnosis not present

## 2024-01-28 ENCOUNTER — Other Ambulatory Visit: Payer: Self-pay | Admitting: Family Medicine

## 2024-02-01 ENCOUNTER — Telehealth: Payer: Self-pay

## 2024-02-01 NOTE — Progress Notes (Signed)
   02/01/2024  Patient ID: Frederick Guzman, male   DOB: June 09, 1950, 73 y.o.   MRN: [id number]  Missed call/voicemail from patient stating he is down to his last dose of Ozempic  1mg  that he receives through [facility].  He is aware of program changes starting in January, but he would like to see if he can get a refill before the end of the year.  Last shipment of 4 boxes was delivered in July, so he should be eligible for another refill.   Coordinating with medication assistance team and PCP to submit a reorder form to [facility].  [name], PharmD, DPLA

## 2024-02-01 NOTE — Telephone Encounter (Signed)
 Completed refill form for Ozempic  ([facility] nordisk) and faxed to provider office for review and signature

## 2024-02-03 ENCOUNTER — Other Ambulatory Visit (HOSPITAL_COMMUNITY): Payer: Self-pay

## 2024-02-07 DIAGNOSIS — Z87891 Personal history of nicotine dependence: Secondary | ICD-10-CM | POA: Diagnosis not present

## 2024-02-07 DIAGNOSIS — J3089 Other allergic rhinitis: Secondary | ICD-10-CM | POA: Diagnosis not present

## 2024-02-07 DIAGNOSIS — J441 Chronic obstructive pulmonary disease with (acute) exacerbation: Secondary | ICD-10-CM | POA: Diagnosis not present

## 2024-02-07 DIAGNOSIS — R918 Other nonspecific abnormal finding of lung field: Secondary | ICD-10-CM | POA: Diagnosis not present

## 2024-02-07 DIAGNOSIS — G4733 Obstructive sleep apnea (adult) (pediatric): Secondary | ICD-10-CM | POA: Diagnosis not present

## 2024-02-10 ENCOUNTER — Ambulatory Visit: Admitting: Gastroenterology

## 2024-02-10 ENCOUNTER — Encounter: Payer: Self-pay | Admitting: Gastroenterology

## 2024-02-10 VITALS — BP 119/59 | HR 80 | Temp 97.7°F | Resp 14 | Ht 73.0 in | Wt 200.0 lb

## 2024-02-10 DIAGNOSIS — K635 Polyp of colon: Secondary | ICD-10-CM

## 2024-02-10 DIAGNOSIS — D124 Benign neoplasm of descending colon: Secondary | ICD-10-CM

## 2024-02-10 DIAGNOSIS — D122 Benign neoplasm of ascending colon: Secondary | ICD-10-CM

## 2024-02-10 DIAGNOSIS — K648 Other hemorrhoids: Secondary | ICD-10-CM | POA: Diagnosis not present

## 2024-02-10 DIAGNOSIS — D128 Benign neoplasm of rectum: Secondary | ICD-10-CM

## 2024-02-10 DIAGNOSIS — K5730 Diverticulosis of large intestine without perforation or abscess without bleeding: Secondary | ICD-10-CM

## 2024-02-10 DIAGNOSIS — R933 Abnormal findings on diagnostic imaging of other parts of digestive tract: Secondary | ICD-10-CM | POA: Diagnosis not present

## 2024-02-10 DIAGNOSIS — Q439 Congenital malformation of intestine, unspecified: Secondary | ICD-10-CM | POA: Diagnosis not present

## 2024-02-10 DIAGNOSIS — D123 Benign neoplasm of transverse colon: Secondary | ICD-10-CM

## 2024-02-10 DIAGNOSIS — R194 Change in bowel habit: Secondary | ICD-10-CM | POA: Diagnosis not present

## 2024-02-10 DIAGNOSIS — D125 Benign neoplasm of sigmoid colon: Secondary | ICD-10-CM

## 2024-02-10 DIAGNOSIS — Z860101 Personal history of adenomatous and serrated colon polyps: Secondary | ICD-10-CM | POA: Diagnosis not present

## 2024-02-10 MED ORDER — SODIUM CHLORIDE 0.9 % IV SOLN: 500.0000 mL | Freq: Once | INTRAVENOUS | Status: DC

## 2024-02-10 NOTE — Progress Notes (Signed)
 No significant changes to clinical history since GI office visit on 01/19/24.  The patient is appropriate for an endoscopic procedure in the ambulatory setting.  - Victory Brand, MD

## 2024-02-10 NOTE — Progress Notes (Signed)
 Called to room to assist during endoscopic procedure.  Patient ID and intended procedure confirmed with present staff. Received instructions for my participation in the procedure from the performing physician.

## 2024-02-10 NOTE — Progress Notes (Signed)
 Sedate, gd SR, tolerated procedure well, VSS, report to RN

## 2024-02-10 NOTE — Patient Instructions (Signed)
 Thank you for letting us  care for your healthcare needs today! Please see handouts regarding Polyps, Hemorrhoids, and Diverticulosis.  - Resume previous diet - Continue current medications - Await pathology results - No aspirin , ibuprofen , naproxen, or other non-steroidal anti-inflammatory drugs (NSAIDs) for 1 week  YOU HAD AN ENDOSCOPIC PROCEDURE TODAY AT THE [facility]:   Refer to the procedure report that was given to you for any specific questions about what was found during the examination.  If the procedure report does not answer your questions, please call your gastroenterologist to clarify.  If you requested that your care partner not be given the details of your procedure findings, then the procedure report has been included in a sealed envelope for you to review at your convenience later.  YOU SHOULD EXPECT: Some feelings of bloating in the abdomen. Passage of more gas than usual.  Walking can help get rid of the air that was put into your GI tract during the procedure and reduce the bloating. If you had a lower endoscopy (such as a colonoscopy or flexible sigmoidoscopy) you may notice spotting of blood in your stool or on the toilet paper. If you underwent a bowel prep for your procedure, you may not have a normal bowel movement for a few days.  Please Note:  You might notice some irritation and congestion in your nose or some drainage.  This is from the oxygen used during your procedure.  There is no need for concern and it should clear up in a day or so.  SYMPTOMS TO REPORT IMMEDIATELY:  Following lower endoscopy (colonoscopy or flexible sigmoidoscopy):  Excessive amounts of blood in the stool  Significant tenderness or worsening of abdominal pains  Swelling of the abdomen that is new, acute  Fever of 100F or higher  For urgent or emergent issues, a gastroenterologist can be reached at any hour by calling [phone]. Do not use MyChart messaging for urgent  concerns.    DIET:  We do recommend a small meal at first, but then you may proceed to your regular diet.  Drink plenty of fluids but you should avoid alcoholic beverages for 24 hours.  ACTIVITY:  You should plan to take it easy for the rest of today and you should NOT DRIVE or use heavy machinery until tomorrow (because of the sedation medicines used during the test).    FOLLOW UP: Our staff will call the number listed on your records the next business day following your procedure.  We will call around 7:15- 8:00 am to check on you and address any questions or concerns that you may have regarding the information given to you following your procedure. If we do not reach you, we will leave a message.     If any biopsies were taken you will be contacted by phone or by letter within the next 1-3 weeks.  Please call us  at [phone] if you have not heard about the biopsies in 3 weeks.    SIGNATURES/CONFIDENTIALITY: You and/or your care partner have signed paperwork which will be entered into your electronic medical record.  These signatures attest to the fact that that the information above on your After Visit Summary has been reviewed and is understood.  Full responsibility of the confidentiality of this discharge information lies with you and/or your care-partner.

## 2024-02-10 NOTE — Op Note (Signed)
 [facility] Patient Name: Frederick Guzman Procedure Date: 02/10/2024 12:58 PM MRN: [id number] Endoscopist: Victory L. Legrand , MD, [id number] Age: 73 Referring MD:  Date of Birth: May 09, 1950 Gender: Male Account #: [id number] Procedure:                Colonoscopy Indications:              Abnormal PET scan of the GI tract                           activity in sigmoid colon - clinical details in                            recent office note                           2 diminutive TA polyps Feb 2020 Medicines:                Monitored Anesthesia Care Procedure:                Pre-Anesthesia Assessment:                           - Prior to the procedure, a History and Physical                            was performed, and patient medications and                            allergies were reviewed. The patient's tolerance of                            previous anesthesia was also reviewed. The risks                            and benefits of the procedure and the sedation                            options and risks were discussed with the patient.                            All questions were answered, and informed consent                            was obtained. Prior Anticoagulants: The patient has                            taken no anticoagulant or antiplatelet agents. ASA                            Grade Assessment: III - A patient with severe                            systemic disease. After reviewing the risks and  benefits, the patient was deemed in satisfactory                            condition to undergo the procedure.                           After obtaining informed consent, the colonoscope                            was passed under direct vision. Throughout the                            procedure, the patient's blood pressure, pulse, and                            oxygen saturations were monitored continuously. The                             Olympus CF-HQ190L ([id number]) Colonoscope was                            introduced through the anus and advanced to the the                            cecum, identified by appendiceal orifice and                            ileocecal valve. The colonoscopy was somewhat                            difficult due to a tortuous colon. The patient                            tolerated the procedure well. The quality of the                            bowel preparation was good. The ileocecal valve,                            appendiceal orifice, and rectum were photographed. Scope In: 1:25:22 PM Scope Out: 2:16:43 PM Scope Withdrawal Time: 0 hours 47 minutes 40 seconds  Total Procedure Duration: 0 hours 51 minutes 21 seconds  Findings:                 Prolapsed internal hemorrhoids were found on                            perianal exam. DRE normal                           Repeat examination of right colon under NBI                            performed.  Four sessile polyps were found in the transverse                            colon and ascending colon. The polyps were 4 to 6                            mm in size. These polyps were removed with a cold                            snare. Resection and retrieval were complete. (Jar                            1)                           Three sessile polyps were found in the transverse                            colon. The polyps were 4 to 6 mm in size. These                            polyps were removed with a cold snare. Resection                            and retrieval were complete. (Jar 2)                           Two pedunculated polyps were found in the                            transverse colon. The polyps were 8 to 12 mm in                            size. These polyps were removed with a hot snare.                            Resection and retrieval were complete. (Jar 2)                            Diverticula were found in the entire colon.                           A 6 mm polyp was found in the descending colon. The                            polyp was pedunculated. The polyp was removed with                            a hot snare. Resection and retrieval were complete.                            (Jar 3)  A 4 mm polyp was found in the rectum. The polyp was                            sessile (possibly hyperplastic by appearance). The                            polyp was removed with a cold snare. Resection and                            retrieval were complete. (Jar 3)                           An 18 mm polyp was found in the distal sigmoid                            colon. The polyp was pedunculated. Area was                            successfully injected with 1 mL of a 0.1 mg/mL                            solution of epinephrine  for hemostasis. The polyp                            was removed with a hot snare. The polyp was removed                            with a piecemeal technique using a hot snare.                            Resection and retrieval were complete. Additional                            STSC applied to stalk polypectomy site to ensure no                            residual tissue. Area was tattooed with an                            injection of 1 mL of Spot (carbon black). - two 0.5                            ml spot injections, one proximal and one distal to                            site (Jar 4)                           Internal hemorrhoids were found. The hemorrhoids                            were large.  The exam was otherwise without abnormality on                            direct and retroflexion views. Complications:            No immediate complications. Estimated Blood Loss:     Estimated blood loss was minimal. Impression:               - Hemorrhoids found on perianal exam.                            - Four 4 to 6 mm polyps in the transverse colon and                            in the ascending colon, removed with a cold snare.                            Resected and retrieved.                           - Three 4 to 6 mm polyps in the transverse colon,                            removed with a cold snare. Resected and retrieved.                           - Two 8 to 12 mm polyps in the transverse colon,                            removed with a hot snare. Resected and retrieved.                           - Diverticulosis in the entire examined colon.                           - One 6 mm polyp in the descending colon, removed                            with a hot snare. Resected and retrieved.                           - One 4 mm polyp in the rectum, removed with a cold                            snare. Resected and retrieved.                           - One 18 mm polyp in the distal sigmoid colon,                            removed with a hot snare and removed piecemeal  using a hot snare. Resected and retrieved.                            Injected. Tattooed.                           - Internal hemorrhoids.                           - The examination was otherwise normal on direct                            and retroflexion views. Recommendation:           - Patient has a contact number available for                            emergencies. The signs and symptoms of potential                            delayed complications were discussed with the                            patient. Return to normal activities tomorrow.                            Written discharge instructions were provided to the                            patient.                           - Resume previous diet.                           - Continue present medications.                           - Await pathology results.                           - No aspirin , ibuprofen , naproxen, or  other                            non-steroidal anti-inflammatory drugs for 1 week                            after polyp removal.                           - Repeat colonoscopy in 1 year for surveillance.                            (or sooner if pathology dictates that) Victory L. Legrand, MD 02/10/2024 2:28:51 PM This report has been signed electronically.

## 2024-02-13 ENCOUNTER — Telehealth: Payer: Self-pay

## 2024-02-13 NOTE — Telephone Encounter (Signed)
  Follow up Call-     02/10/2024   12:34 PM  Call back number  Post procedure Call Back phone  # [phone]  Permission to leave phone message Yes     Patient questions:  Do you have a fever, pain , or abdominal swelling? No. Pain Score  0 *  Have you tolerated food without any problems? Yes.    Have you been able to return to your normal activities? Yes.    Do you have any questions about your discharge instructions: Diet   No. Medications  No. Follow up visit  No.  Do you have questions or concerns about your Care? No.  Actions: * If pain score is 4 or above: No action needed, pain <4.

## 2024-02-15 ENCOUNTER — Ambulatory Visit: Payer: Self-pay | Admitting: Gastroenterology

## 2024-02-15 LAB — SURGICAL PATHOLOGY

## 2024-02-21 ENCOUNTER — Telehealth: Payer: Self-pay

## 2024-02-21 ENCOUNTER — Ambulatory Visit: Admitting: Gastroenterology

## 2024-02-21 NOTE — Progress Notes (Signed)
   02/21/2024  Patient ID: Frederick Guzman, male   DOB: [birth year], 73 y.o.   MRN: [id number]  Contacted [facility] to check on the reorder status for his Ozempic  1mg .  Two pens shipped today, tracking [id number].  Medication should arrive by EOD tomorrow.  Sending patient a MyChart message to notify him.  [name], PharmD, DPLA

## 2024-02-22 ENCOUNTER — Telehealth: Payer: Self-pay

## 2024-02-22 NOTE — Telephone Encounter (Signed)
 Received  Ozempic  1mg    4 boxes ( one pen per box)  From patient assitance.  Called patient and left a voice mail message on primary number ( allowed on DPR)  that medication was received and he can pick up at his convenience.

## 2024-02-24 ENCOUNTER — Other Ambulatory Visit: Payer: Self-pay

## 2024-02-24 DIAGNOSIS — E119 Type 2 diabetes mellitus without complications: Secondary | ICD-10-CM

## 2024-02-24 DIAGNOSIS — I10 Essential (primary) hypertension: Secondary | ICD-10-CM

## 2024-02-24 DIAGNOSIS — J410 Simple chronic bronchitis: Secondary | ICD-10-CM

## 2024-02-24 DIAGNOSIS — E1169 Type 2 diabetes mellitus with other specified complication: Secondary | ICD-10-CM

## 2024-02-24 NOTE — Progress Notes (Signed)
 02/24/2024 Name: Frederick Guzman MRN: [id number] DOB: [birth year]  Chief Complaint  Patient presents with   Diabetes Management Plan   Frederick Guzman is a 73 y.o. year old male who presented for a telephone follow-up visit.  Subjective:  Care Team: Primary Care Provider: Alvia Bring, DO ; Next Scheduled Visit: 06/29/24  Medication Access/Adherence  Current Pharmacy:  [facility] - [place], [place] - [address] [place] [address] [place] [address] Phone: [phone] Fax: [phone]  [id number] [place], [place] - [address] [place] [address] Phone: [phone] Fax: [phone]  [id number] - [address] ([place]), [place] - [address] [place] [address] Phone: [phone] Fax: [phone]  [place] [facility] [address] [place] [address] Phone: [phone] Fax: [phone]  Patient reports affordability concerns with their medications: Yes  Patient reports access/transportation concerns to their pharmacy: No  Patient reports adherence concerns with their medications:  Yes    Diabetes: Current medications: Ozempic  1mg  weekly, Tresiba  20 units BID -Using Libre 3+ for CGM -Current medication access support: Currently enrolled in [facility] for Ozempic , Tresiba  and pen needles.  He just picked up 2 boxes of Ozempic  (and some Tresiba ) this week, but this will be the last shipment of Ozempic  from [facility]  Macrovascular and Microvascular Risk Reduction:  Statin? yes (atorvastatin  40mg ); ACEi/ARB? yes (lisinopril  2.5mg )  OV BP 119/59 on 02/10/24 Last urinary albumin /creatinine ratio:  Lab Results  Component Value Date   MICRALBCREAT 4 06/29/2023   MICRALBCREAT <30 03/03/2023   MICRALBCREAT <30 12/08/2021   Last eye exam:  Lab Results  Component Value Date   HMDIABEYEEXA No Retinopathy 12/02/2022    Last foot exam: 06/29/2023 Tobacco Use:  Tobacco Use: Medium Risk (02/10/2024)   Patient History    Smoking Tobacco Use: Former    Smokeless Tobacco Use: Never    Passive Exposure: Not on file   COPD: Current medications:  Breztri 2 puffs BID, albuterol  HFA rescue inhaler PRN, albuterol  nebulizer solution PRN -Reports 1 exacerbation in the past year -Patient states he is using Breztri as prescribed, and he currently gets samples from pulmonology due to high copay of medication (around $100) -He uses albuterol  sparingly only when absolutely needed, because this inhaler is around $40 to refill  Objective:  Lab Results  Component Value Date   HGBA1C 5.8 12/30/2023   Lab Results  Component Value Date   CREATININE 1.20 06/29/2023   BUN 23 06/29/2023   NA 141 06/29/2023   K 3.8 06/29/2023   CL 97 06/29/2023   CO2 25 06/29/2023   Lab Results  Component Value Date   CHOL 151 06/29/2023   HDL 52 06/29/2023   LDLCALC 83 06/29/2023   TRIG 82 06/29/2023   CHOLHDL 3.3 06/09/2022   Medications Reviewed Today     Reviewed by Deanna Channing LABOR, RPH (Pharmacist) on 02/24/24 at 1030  Med List Status: <None>   Medication Order Taking? Sig Documenting Provider Last Dose Status Informant  albuterol  (PROVENTIL ) (2.5 MG/3ML) 0.083% nebulizer solution [id number] Yes Take 2.5 mg by nebulization every 6 (six) hours as needed for wheezing or shortness of breath. [provider]  Active Self  albuterol  (VENTOLIN  HFA) 108 (90 Base) MCG/ACT inhaler [id number] Yes Inhale 2 puffs into the lungs every 6 (six) hours as needed for wheezing or shortness of breath. [provider]  Active Self  atorvastatin  (LIPITOR) 40 MG tablet [id number] Yes Take 1 tablet (40 mg total) by mouth every other day. Alvia Bring, DO  Active   b complex vitamins tablet [id number]  Take 1 tablet by mouth daily. [provider]  Active Self  Budeson-Glycopyrrol-Formoterol  (BREZTRI AEROSPHERE)  160-9-4.8 MCG/ACT AERO [id number] Yes Inhale 1 puff into the lungs daily.  Patient taking differently: Inhale 2 puffs into the lungs 2 (two) times daily.   [provider]  Active Self  buPROPion  (WELLBUTRIN  XL) 150 MG 24 hr tablet [id number] Yes Take 1 tablet (150 mg total) by mouth daily. Alvia Bring, DO  Active   cetirizine (ZYRTEC) 10 MG tablet [id number]  Take 10 mg by mouth daily as needed for allergies.  [provider]  Active Self           Med Note (COFFELL, JON HERO   Mon Aug 31, 2021 11:08 AM)    ciclopirox  (PENLAC ) 8 % solution [id number]  Apply topically at bedtime. Apply over nail & surrounding skin. Apply daily over previous coat. After 7 days, may remove with alcohol & continue cycle. Joya Stabs, DPM  Active   Continuous Glucose Receiver (FREESTYLE LIBRE 3 READER) DEVI [id number] Yes Use to monitor glucose continuously. Alvia Bring, DO  Active            Med Note PECOLA, [name] Z   Wed Jun 29, 2023  8:21 AM)    Continuous Glucose Sensor (FREESTYLE LIBRE 3 PLUS SENSOR) [place] [id number] Yes Change sensor every 15 days. Alvia Bring, DO  Active   esomeprazole  (NEXIUM ) 40 MG capsule [id number] Yes TAKE ONE CAPSULE BY MOUTH EVERY DAY Alvia Bring, DO  Active   fluticasone  (FLONASE ) 50 MCG/ACT nasal spray [id number]  Place 1 spray into both nostrils daily as needed for allergies. [provider]  Active Self           Med Note CLAUD MICHEAL ONEIDA Sonjia Jan 27, 2016  3:14 PM)    insulin  degludec (TRESIBA  FLEXTOUCH) 100 UNIT/ML FlexTouch Pen [id number] Yes Inject 20 Units into the skin 2 (two) times daily. Do not bill insurance-please bill to manufacture provided free 30-day voucher: [phone], PCN-CNRX, GRP--[id number], [id number] Alvia Bring, DO  Active   lisinopril  (ZESTRIL ) 2.5 MG tablet [id number] Yes Take 1 tablet (2.5 mg total) by mouth at bedtime. Alvia Bring, DO  Active   meloxicam  (MOBIC ) 15 MG tablet [id number]  Take 1 tablet (15 mg total)  by mouth daily. Sikora, Rebecca, DPM  Active   Multiple Vitamin (MULTIVITAMIN WITH MINERALS) TABS tablet [id number]  Take 1 tablet by mouth at bedtime. [provider]  Active Self  nitroGLYCERIN  (NITROSTAT ) 0.4 MG SL tablet [id number] Yes Place 1 tablet (0.4 mg total) under the tongue every 5 (five) minutes as needed for chest pain. Revankar, Jennifer SAUNDERS, MD  Active   NOVOFINE PEN NEEDLE 32G X 6 MM MISC [id number] Yes Used to inject Tresiba  twice daily.Do not bill insurance-please bill to manufacture provided free 30-day voucher: [phone], PCN-CNRX, GRP--[id number], PI-[id number] Alvia Bring, DO  Active   Omega-3 Fatty Acids (FISH OIL) 1000 MG CAPS [id number]  Take 1,000 mg by mouth at bedtime.  [provider]  Active Self  Polyvinyl Alcohol-Povidone (REFRESH OP) [id number]  Place 1 drop into both eyes daily as needed (dry eyes). [provider]  Active Self  Semaglutide , 1 MG/DOSE, (OZEMPIC , 1 MG/DOSE,) 4 MG/3ML SOPN [id number] Yes Inject 1 mg into the skin once a  week. Do not bill insurance- bill to [employer] voucher: [phone] PCN-CNRX [phone] [id number] Alvia Bring, DO  Active            Med Note ZENA, Morrie Daywalt A   Tue Feb 21, 2024  4:46 PM) [facility]  tacrolimus  (PROTOPIC ) 0.1 % ointment [id number]  Apply 1 application topically daily. [provider]  Active Self  tadalafil  (CIALIS ) 20 MG tablet [id number]  TAKE ONE TABLET BY MOUTH DAILY AS NEEDED FOR ERECTILE DYSFUNCTION Alvia Bring, DO  Active   tamsulosin  (FLOMAX ) 0.4 MG CAPS capsule [id number] Yes Take 1 capsule (0.4 mg total) by mouth at bedtime. Alvia Bring, DO  Active   Testosterone  20.25 MG/ACT (1.62%) GEL [id number] Yes Apply 1 pump to upper arms/shoulders daily. Alvia Bring, DO  Active   torsemide  (DEMADEX ) 100 MG tablet [id number] Yes Take 1 tablet (100 mg total) by mouth daily. Alvia Bring, DO  Active   vitamin C  (ASCORBIC ACID ) 500 MG tablet [id number]  Take 500 mg by mouth daily.  [provider]  Active Self           Assessment/Plan:   Diabetes: -Currently controlled; goal A1c <7%. Cardiorenal risk reduction is optimized.. Blood pressure is at goal <130/80. LDL is not at goal.  -I recommend continuing current regimen -Likely that patient will qualify for LIS Medicare Extra Help based on [facility] and resources, so an application has been submitted online.  If approved, this will make copay for brand name medications around $12 for up to a 3 month supply and generic medications around $4.  Advised patient he should receive a letter in the mail in 4-6 weeks from [facility] with a determination. -Will go ahead and work on [employer] [facility] re-enrollment to continue to receive Tresiba  and pen needles -If LIS denied, I will see what his Ozempic  copay on insurance will be and look into alternatives if not affordable -Sees PCP again in April and will be due for A1c, CMP, UACR, and lipids- if LDL still >70, consider increasing atorvastatin  to 80mg  daily  COPD: -Currently controlled.  -Recommend to continue current regimen at this time -If LIS denied, patient should qualify for [place] PAP for [facility]; and I will assist with applying for this program  Follow Up Plan: 6 weeks to check on LIS status  [name], PharmD, DPLA

## 2024-03-11 ENCOUNTER — Other Ambulatory Visit: Payer: Self-pay | Admitting: Family Medicine

## 2024-03-11 DIAGNOSIS — I10 Essential (primary) hypertension: Secondary | ICD-10-CM

## 2024-03-18 ENCOUNTER — Encounter: Payer: Self-pay | Admitting: Family Medicine

## 2024-03-23 ENCOUNTER — Encounter: Payer: Self-pay | Admitting: Podiatry

## 2024-03-23 ENCOUNTER — Ambulatory Visit (INDEPENDENT_AMBULATORY_CARE_PROVIDER_SITE_OTHER): Admitting: Podiatry

## 2024-03-23 DIAGNOSIS — M79675 Pain in left toe(s): Secondary | ICD-10-CM | POA: Diagnosis not present

## 2024-03-23 DIAGNOSIS — B351 Tinea unguium: Secondary | ICD-10-CM | POA: Diagnosis not present

## 2024-03-23 DIAGNOSIS — M79674 Pain in right toe(s): Secondary | ICD-10-CM | POA: Diagnosis not present

## 2024-03-23 DIAGNOSIS — E119 Type 2 diabetes mellitus without complications: Secondary | ICD-10-CM | POA: Diagnosis not present

## 2024-03-23 NOTE — Telephone Encounter (Signed)
"   Patient came into office to drop off CMV Driver Medication Form, forms placed in box, thanks.  "

## 2024-03-23 NOTE — Progress Notes (Signed)
 "  Subjective:  Patient ID: Frederick Guzman, male    DOB: Nov 02, 1950,   MRN: [id number]  Chief Complaint  Patient presents with   Diabetes    Trim my toenails.  Saw Dr. Alvia - 12/18/2023;  A1c - 5.8    74 y.o. male presents for multiple concerns including concern of thickened elongated and painful nails that are difficult to trim. Requesting to have them trimmed today. Denies burning and tingling in their feet. Patient is diabetic and last A1c was  Lab Results  Component Value Date   HGBA1C 5.8 12/30/2023   .  PCP:  Alvia Bring, DO    . Denies any other pedal complaints. Denies n/v/f/c.   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   Arthritis    OA   Arthritis of wrist 05/28/2011   Biliary obstruction (HCC) 09/04/2021   BPH (benign prostatic hyperplasia) 06/17/2011   BRONCHITIS, CHRONIC 03/10/2007   Qualifier: Diagnosis of   By: Shellia MD, Vineet       Cancer [facility]) 08/2014   melanoma on R shoulder & back - [facility]    Combined forms of age-related cataract of right eye 09/21/2018   COPD (chronic obstructive pulmonary disease) (HCC)    COPD with exacerbation (HCC) 04/13/2021   Diverticulitis    Dyspnea    with exertion   Elevated liver enzymes 09/03/2021   Generalized abdominal pain    GERD (gastroesophageal reflux disease)    HTN (hypertension)    Hyperlipemia    Hyperlipidemia 08/07/2020   Hypogonadism male 06/17/2011   Long term use of drug 04/19/2017   Lymphocytic colitis 04/2018   MDD (major depressive disorder) (HCC) 04/30/2011   OBSTRUCTIVE SLEEP APNEA 03/10/2007   Qualifier: Diagnosis of   By: Shellia MD, Vineet       Pain in joint of left shoulder 04/19/2017   Pain in right hand 08/04/2017   Pneumonia 06/2015, 10/2016   Positive depression screening 09/21/2016   Pulmonary nodules 10/23/2017   S/P laparoscopic cholecystectomy 09/03/2021   S/P shoulder replacement,  left 01/30/2016   Status post shoulder surgery 08/09/2016   TOBACCO ABUSE 03/10/2007   Qualifier: Diagnosis of   By: Shellia MD, Vineet       Transaminitis 09/03/2021   Uncontrolled type 2 diabetes mellitus with hyperglycemia, without long-term current use of insulin  (HCC) 09/21/2016   Likely will need dual or triple therapy but will start with metformin  alone in case of intolerance. Close follow-up with home blood sugar readings. ?steroid rol    Objective:  Physical Exam: Vascular: DP/PT pulses 2/4 bilateral. CFT <3 seconds. Normal hair growth on digits. No edema.  Skin. No lacerations or abrasions bilateral feet. Nails 1-5 bilateral thickened and elongated with subungual debris.  Musculoskeletal: MMT 5/5 bilateral lower extremities in DF, PF, Inversion and Eversion. Deceased ROM in DF of ankle joint. Tender to the medial calcaneal tubercle left . No pain with achilles, PT or arch. No pain with calcaneal squeeze.  Neurological: Sensation intact to light touch.   Assessment:   1. Pain due to onychomycosis of toenails of both feet   2. Type 2 diabetes mellitus without complication, without long-term current use of insulin  (HCC)        Plan:  Patient was evaluated and treated and all questions answered. -Discussed and educated patient on diabetic foot care, especially with  regards to the vascular, neurological and  musculoskeletal systems.  -Stressed the importance of good glycemic control and the detriment of not  controlling glucose levels in relation to the foot. -Discussed supportive shoes at all times and checking feet regularly.  -Mechanically debrided all nails 1-5 bilateral using sterile nail nipper and filed with dremel without incident  Follow-up 3months for rfc.    Asberry Failing, DPM    "

## 2024-03-28 ENCOUNTER — Encounter: Payer: Self-pay | Admitting: Family Medicine

## 2024-03-29 NOTE — Telephone Encounter (Signed)
 Copied from CRM [id number]. Topic: General - Other >> Mar 29, 2024 10:04 AM Mercer PEDLAR wrote: Reason for CRM: Patient stated that he dropped off a form on Friday on 03/23/24  for his physical to be completed by PCP. He stated that he needs the form urgently today to be submitted. He would like a callback with status update.

## 2024-04-05 NOTE — Progress Notes (Signed)
 "  04/05/2024 Name: Frederick Guzman MRN: [id number] DOB: [birth year]  Frederick Guzman is a 74 y.o. year old male who presented for a telephone follow-up visit.  Subjective:  Care Team: Primary Care Provider: Alvia Bring, DO ; Next Scheduled Visit: 06/29/24  Medication Access/Adherence  Current Pharmacy:  [facility] - [place], [place] - [address] [place] [address] [place] [address] Phone: [phone] Fax: [phone]  [facility] [id number] [place], [place] - [address]  [place] [address] Phone: [phone] Fax: [phone]  [id number] ([place]), [place] - [address] [place] [address] Phone: [phone] Fax: [phone]  [facility] [address] [place] [address] Phone: [phone] Fax: [phone]  Patient reports affordability concerns with their medications: Yes  Patient reports access/transportation concerns to their pharmacy: No  Patient reports adherence concerns with their medications:  Yes    Diabetes: Current medications: Ozempic  1mg  weekly, Tresiba  20 units BID -Using Libre 3+ for CGM -Current medication access support: Patient previously enrolled in [employer] PAP for Tresiba , pen needles, and Ozempic .  Will no longer receive Ozempic  due to program changes.  Unclear if re-enrollment for Tresiba  and pen needles has been completed. -Insurance requires a PA for Ozempic  1mg - coordinating with PA team to initiate this -Patient states he can afford to continue Ozempic  if copay around $45/moth  Macrovascular and Microvascular Risk Reduction:  Statin? yes (atorvastatin  40mg ); ACEi/ARB? yes (lisinopril  2.5mg )  OV BP 119/59 on 02/10/24 Last urinary albumin /creatinine ratio:  Lab Results  Component Value Date   MICRALBCREAT 4 06/29/2023   MICRALBCREAT <30 03/03/2023   MICRALBCREAT <30 12/08/2021    Last eye exam:  Lab Results  Component Value Date   HMDIABEYEEXA No Retinopathy 12/02/2022   Last foot exam: 06/29/2023 Tobacco Use:  Tobacco Use: Medium Risk (03/23/2024)   Patient History    Smoking Tobacco Use: Former    Smokeless Tobacco Use: Never    Passive Exposure: Not on file   COPD: Current medications:  Breztri 2 puffs BID, albuterol  HFA rescue inhaler PRN, albuterol  nebulizer solution PRN -Reports 1 exacerbation in the past year -Patient states he is using Breztri as prescribed, and he currently gets samples from pulmonology due to high copay of medication (around $100) -He uses albuterol  sparingly only when absolutely needed, because this inhaler is around $40 to refill  Objective:  Lab Results  Component Value Date   HGBA1C 5.8 12/30/2023   Lab Results  Component Value Date   CREATININE 1.20 06/29/2023   BUN 23 06/29/2023   NA 141 06/29/2023   K 3.8 06/29/2023   CL 97 06/29/2023   CO2 25 06/29/2023   Lab Results  Component Value Date   CHOL 151 06/29/2023   HDL 52 06/29/2023   LDLCALC 83 06/29/2023   TRIG 82 06/29/2023   CHOLHDL 3.3 06/09/2022   Assessment/Plan:   Diabetes: -Currently controlled; goal A1c <7%. Cardiorenal risk reduction is optimized.. Blood pressure is at goal <130/80. LDL is not at goal.  -I recommend continuing current regimen -Patient has not received a response from [employer] Medicare Extra Help application  -Coordinating with PA team to initiate PA for Ozempic  1mg , so we can check patient's copay on insurance.  He only has 2 doses of Ozempic  1mg  on hand at this time. -If [employer] denied and copay for Ozempic  not affordable, we could  change to Trulicity and apply for [employer] PAP -Coordinating with medication assistance team to work on [employer] PAP re-enrollment for Tresiba  and pen needles if not already done -Sees PCP again in April and will be due for A1c, CMP, UACR, and lipids- if LDL still >70, consider increasing atorvastatin  to 80mg   daily  COPD: -Currently controlled.  -Recommend to continue current regimen at this time -Coordinating with medication assistance team to initiate [employer] PAP application for Breztri  Follow Up Plan: Will follow-up in regard to affordability of Ozempic  once cost is known  [name], PharmD, DPLA    "

## 2024-04-06 ENCOUNTER — Other Ambulatory Visit (INDEPENDENT_AMBULATORY_CARE_PROVIDER_SITE_OTHER): Payer: Self-pay

## 2024-04-06 DIAGNOSIS — I10 Essential (primary) hypertension: Secondary | ICD-10-CM

## 2024-04-06 DIAGNOSIS — E1169 Type 2 diabetes mellitus with other specified complication: Secondary | ICD-10-CM

## 2024-04-06 DIAGNOSIS — J410 Simple chronic bronchitis: Secondary | ICD-10-CM

## 2024-04-06 DIAGNOSIS — E119 Type 2 diabetes mellitus without complications: Secondary | ICD-10-CM

## 2024-04-10 ENCOUNTER — Telehealth: Payer: Self-pay | Admitting: Pharmacy Technician

## 2024-04-10 ENCOUNTER — Telehealth: Payer: Self-pay

## 2024-04-10 ENCOUNTER — Other Ambulatory Visit (HOSPITAL_COMMUNITY): Payer: Self-pay

## 2024-04-10 NOTE — Telephone Encounter (Signed)
 PAP: Patient assistance application for Breztri  through [employer] (AZ&Me) has been mailed to pt's home address on file. Provider portion of application will be faxed to provider's office.

## 2024-04-10 NOTE — Telephone Encounter (Signed)
 PAP: Patient assistance application for Tresiba through [employer] has been mailed to [employer] home address on file. Provider portion of application will be faxed to provider's office.

## 2024-04-10 NOTE — Telephone Encounter (Signed)
 Pharmacy Patient Advocate Encounter   Received notification from Physician's Office that prior authorization for OZEMPIC  is required/requested.   Insurance verification completed.   The patient is insured through [facility].   Per test claim: PA required; PA submitted to above mentioned insurance via Latent Key/confirmation #/EOC [id number] Status is pending

## 2024-04-10 NOTE — Telephone Encounter (Signed)
-----   Message from [name] sent at 04/09/2024  8:31 PM EST ----- Good evening,  [name] was receiving Ozempic  1mg  through [employer] P[employer] but will no longer be able to with the program changes. His insurance is requiring a prior authorization for this medication; could you all work on that; so we can see if it will be affordable on insurance for him?  Thank you!  [name], PharmD, DPLA

## 2024-04-12 NOTE — Progress Notes (Unsigned)
" ° °  04/12/2024  Patient ID: [name], male   DOB: [birth year], 74 y.o.   MRN: [id number]  Ozempic  PA approved and copay reflects $45; patient endorses affordability and would like prescription sent to his pharmacy.  Order pending for PCP to sign if in agreement.  [name], PharmD, DPLA  "

## 2024-04-13 MED ORDER — OZEMPIC (1 MG/DOSE) 4 MG/3ML ~~LOC~~ SOPN: 1.0000 mg | PEN_INJECTOR | SUBCUTANEOUS | 2 refills | Status: AC

## 2024-04-13 NOTE — Telephone Encounter (Signed)
 Pharmacy Patient Advocate Encounter  Received notification from [facility] that Prior Authorization for OZEMPIC  has been APPROVED from 04/11/24 to 04/11/25

## 2024-04-13 NOTE — Telephone Encounter (Signed)
 Received Provider portion PAP application for Novo Nordisk Tresiba .

## 2024-04-13 NOTE — Telephone Encounter (Signed)
 Received Provider portion PAP application for Breztri (AZ&ME)

## 2024-04-17 ENCOUNTER — Other Ambulatory Visit (HOSPITAL_COMMUNITY): Payer: Self-pay

## 2024-04-27 ENCOUNTER — Telehealth: Payer: Self-pay

## 2024-04-27 MED ORDER — BREZTRI AEROSPHERE 160-9-4.8 MCG/ACT IN AERO: 2.0000 | INHALATION_SPRAY | Freq: Every day | RESPIRATORY_TRACT | 6 refills | Status: AC

## 2024-04-27 NOTE — Telephone Encounter (Signed)
 Copied from CRM #[id number]. Topic: Clinical - Medication Question >> Apr 27, 2024  4:38 PM Travis F wrote: Reason for CRM: [facility] is calling in because they need clarification on a precription. The directions say to inhale 2 puffs into the lungs daily. He says the usual dosing is 1 puff twice a day. Pharmacy just needs to know which instructions to put. Please advise.   budesonide -glycopyrrolate -formoterol  (BREZTRI  AEROSPHERE) 160-9-4.8 MCG/ACT AERO inhaler [[id number]]

## 2024-04-27 NOTE — Addendum Note (Signed)
 Addended by: Dyllen Menning E on: 04/27/2024 12:34 PM   Modules accepted: Orders

## 2024-06-22 ENCOUNTER — Ambulatory Visit: Admitting: Podiatry

## 2024-06-29 ENCOUNTER — Ambulatory Visit: Admitting: Family Medicine

## 2024-08-21 ENCOUNTER — Ambulatory Visit
